# Patient Record
Sex: Male | Born: 1965 | Race: White | Hispanic: No | Smoking: Never smoker
Health system: Southern US, Community
[De-identification: ages and names within clinical notes are randomized; demographics above are authoritative.]

## PROBLEM LIST (undated history)

## (undated) DIAGNOSIS — I4891 Unspecified atrial fibrillation: Secondary | ICD-10-CM

## (undated) DIAGNOSIS — M109 Gout, unspecified: Secondary | ICD-10-CM

## (undated) DIAGNOSIS — I10 Essential (primary) hypertension: Secondary | ICD-10-CM

## (undated) DIAGNOSIS — M1990 Unspecified osteoarthritis, unspecified site: Secondary | ICD-10-CM

## (undated) DIAGNOSIS — E119 Type 2 diabetes mellitus without complications: Secondary | ICD-10-CM

## (undated) DIAGNOSIS — G4730 Sleep apnea, unspecified: Secondary | ICD-10-CM

## (undated) DIAGNOSIS — I509 Heart failure, unspecified: Secondary | ICD-10-CM

## (undated) DIAGNOSIS — E785 Hyperlipidemia, unspecified: Secondary | ICD-10-CM

## (undated) HISTORY — PX: KNEE ARTHROSCOPY: SUR90

## (undated) HISTORY — DX: Essential (primary) hypertension: I10

## (undated) HISTORY — DX: Unspecified atrial fibrillation: I48.91

## (undated) HISTORY — DX: Heart failure, unspecified: I50.9

## (undated) HISTORY — DX: Type 2 diabetes mellitus without complications: E11.9

## (undated) HISTORY — DX: Morbid (severe) obesity due to excess calories: E66.01

## (undated) HISTORY — DX: Unspecified osteoarthritis, unspecified site: M19.90

## (undated) HISTORY — DX: Gout, unspecified: M10.9

## (undated) HISTORY — PX: ANKLE ARTHROSCOPY: SUR85

## (undated) HISTORY — DX: Hyperlipidemia, unspecified: E78.5

## (undated) HISTORY — PX: CYST REMOVAL HAND: SHX6279

---

## 2000-06-05 ENCOUNTER — Encounter: Admission: RE | Admit: 2000-06-05 | Discharge: 2000-06-05 | Payer: Self-pay | Admitting: Gastroenterology

## 2000-06-05 ENCOUNTER — Encounter: Payer: Self-pay | Admitting: Gastroenterology

## 2000-11-03 ENCOUNTER — Encounter: Payer: Self-pay | Admitting: Orthopedic Surgery

## 2000-11-03 ENCOUNTER — Ambulatory Visit (HOSPITAL_COMMUNITY): Admission: RE | Admit: 2000-11-03 | Discharge: 2000-11-03 | Payer: Self-pay | Admitting: Orthopedic Surgery

## 2008-09-12 ENCOUNTER — Emergency Department (HOSPITAL_COMMUNITY): Admission: EM | Admit: 2008-09-12 | Discharge: 2008-09-12 | Payer: Self-pay | Admitting: Emergency Medicine

## 2008-10-21 ENCOUNTER — Ambulatory Visit (HOSPITAL_COMMUNITY): Admission: RE | Admit: 2008-10-21 | Discharge: 2008-10-21 | Payer: Self-pay | Admitting: Specialist

## 2010-06-10 ENCOUNTER — Encounter: Payer: Self-pay | Admitting: Specialist

## 2012-02-05 ENCOUNTER — Encounter (INDEPENDENT_AMBULATORY_CARE_PROVIDER_SITE_OTHER): Payer: Self-pay | Admitting: *Deleted

## 2013-03-25 ENCOUNTER — Encounter: Payer: Self-pay | Admitting: Nurse Practitioner

## 2013-03-25 NOTE — Progress Notes (Signed)
  Subjective:    Patient ID: Evan [name], male    DOB: 1966-05-12, 47 y.o.   MRN: [id number]  HPI    Review of Systems     Objective:   Physical Exam        Assessment & Plan:  Erroneous

## 2016-09-22 ENCOUNTER — Encounter: Payer: Self-pay | Admitting: Internal Medicine

## 2016-09-22 ENCOUNTER — Ambulatory Visit (INDEPENDENT_AMBULATORY_CARE_PROVIDER_SITE_OTHER): Payer: Medicaid Other

## 2016-09-22 ENCOUNTER — Ambulatory Visit (INDEPENDENT_AMBULATORY_CARE_PROVIDER_SITE_OTHER): Payer: Medicaid Other | Admitting: Family Medicine

## 2016-09-22 ENCOUNTER — Encounter: Payer: Self-pay | Admitting: Family Medicine

## 2016-09-22 VITALS — BP 161/103 | HR 81 | Temp 97.7°F | Ht 77.0 in | Wt 289.0 lb

## 2016-09-22 DIAGNOSIS — IMO0002 Reserved for concepts with insufficient information to code with codable children: Secondary | ICD-10-CM

## 2016-09-22 DIAGNOSIS — E785 Hyperlipidemia, unspecified: Secondary | ICD-10-CM

## 2016-09-22 DIAGNOSIS — I10 Essential (primary) hypertension: Secondary | ICD-10-CM | POA: Insufficient documentation

## 2016-09-22 DIAGNOSIS — E11610 Type 2 diabetes mellitus with diabetic neuropathic arthropathy: Secondary | ICD-10-CM

## 2016-09-22 DIAGNOSIS — M546 Pain in thoracic spine: Secondary | ICD-10-CM

## 2016-09-22 DIAGNOSIS — E1165 Type 2 diabetes mellitus with hyperglycemia: Secondary | ICD-10-CM

## 2016-09-22 DIAGNOSIS — E119 Type 2 diabetes mellitus without complications: Secondary | ICD-10-CM

## 2016-09-22 DIAGNOSIS — Z794 Long term (current) use of insulin: Secondary | ICD-10-CM | POA: Diagnosis not present

## 2016-09-22 DIAGNOSIS — M109 Gout, unspecified: Secondary | ICD-10-CM | POA: Insufficient documentation

## 2016-09-22 DIAGNOSIS — E11628 Type 2 diabetes mellitus with other skin complications: Secondary | ICD-10-CM | POA: Insufficient documentation

## 2016-09-22 DIAGNOSIS — E1169 Type 2 diabetes mellitus with other specified complication: Secondary | ICD-10-CM

## 2016-09-22 DIAGNOSIS — Z1211 Encounter for screening for malignant neoplasm of colon: Secondary | ICD-10-CM | POA: Diagnosis not present

## 2016-09-22 DIAGNOSIS — E1159 Type 2 diabetes mellitus with other circulatory complications: Secondary | ICD-10-CM | POA: Insufficient documentation

## 2016-09-22 DIAGNOSIS — I152 Hypertension secondary to endocrine disorders: Secondary | ICD-10-CM | POA: Insufficient documentation

## 2016-09-22 LAB — BAYER DCA HB A1C WAIVED: HB A1C (BAYER DCA - WAIVED): 7.3 % — ABNORMAL HIGH (ref <7.0)

## 2016-09-22 MED ORDER — JARDIANCE 25 MG PO TABS: 25.0000 mg | ORAL_TABLET | Freq: Every day | ORAL | 2 refills | Status: DC

## 2016-09-22 MED ORDER — BENAZEPRIL HCL 20 MG PO TABS: 20.0000 mg | ORAL_TABLET | Freq: Every day | ORAL | 2 refills | Status: DC

## 2016-09-22 MED ORDER — DULAGLUTIDE 0.75 MG/0.5ML ~~LOC~~ SOAJ: 0.7500 mg | SUBCUTANEOUS | 2 refills | Status: DC

## 2016-09-22 NOTE — Progress Notes (Signed)
BP (!) 161/103   Pulse 81   Temp 97.7 F (36.5 C) (Oral)   Ht '6\' 5"'  (1.956 m)   Wt 289 lb (131.1 kg)   BMI 34.27 kg/m    Subjective:    Patient ID: Evan [name], male    DOB: 13-Feb-1966, 50 y.o.   MRN: [id number]  HPI: Evan [name] is a 51 y.o. male presenting on 09/22/2016 for Establish Care (heartburn - was taking Ranitidine in past, has tried OTC Zantac but does not help); Tingling in feet; and Dizziness, fullness in right ear (has taken Sudafed)   HPI Type 2 diabetes mellitus Patient comes in today for establishing care as a new patient and check of his diabetes. Patient has been currently taking Levemir and Jardiance. Patient is currently on an ACE inhibitor. Patient has not seen an ophthalmologist this year. Patient denies any new issues with his feet but does feel like he has some chronic neuropathy.   Hyperlipidemia Patient is coming in for check of his hyperlipidemia. He is currently taking omega 3. He denies any issues with myalgias or history of liver damage from it. He denies any focal numbness or weakness or chest pain.   Hypertension recheck Patient comes in for check for hypertension and is currently on benazepril. Patient's blood pressure today is 161/103 but he has been out of his medication for about a week and a half. Patient denies headaches, blurred vision, chest pains, shortness of breath, or weakness. Denies any side effects from medication and is content with current medication.   Back pain Patient has been having pain on right back belowHer right scapula. The pain has been going on for the past week. He has tried over-the-counter ibuprofen and Tylenol which helps some but he still having the pain and the soreness. He denies any chest pain or shortness of breath or wheezing. He denies any pain radiating anywhere else. He says the pain is low-grade and out 4 out of 10.  Relevant past medical, surgical, family and social history reviewed and updated as  indicated. Interim medical history since our last visit reviewed. Allergies and medications reviewed and updated.  Review of Systems  Constitutional: Negative for chills and fever.  HENT: Negative for ear pain and tinnitus.   Eyes: Negative for pain and discharge.  Respiratory: Negative for cough, shortness of breath and wheezing.   Cardiovascular: Negative for chest pain, palpitations and leg swelling.  Gastrointestinal: Negative for abdominal pain, blood in stool, constipation and diarrhea.  Genitourinary: Negative for dysuria and hematuria.  Musculoskeletal: Positive for back pain. Negative for gait problem and myalgias.  Skin: Negative for rash.  Neurological: Negative for dizziness, weakness and headaches.  Psychiatric/Behavioral: Negative for suicidal ideas.  All other systems reviewed and are negative.   Per HPI unless specifically indicated above  Social History   Social History  . Marital status: Single    Spouse name: N/A  . Number of children: N/A  . Years of education: N/A   Occupational History  . Not on file.   Social History Main Topics  . Smoking status: Never Smoker  . Smokeless tobacco: Never Used  . Alcohol use 7.2 oz/week    12 Cans of beer per week  . Drug use: No  . Sexual activity: Yes     Comment: male 8 months partner   Other Topics Concern  . Not on file   Social History Narrative  . No narrative on file    Past  Surgical History:  Procedure Laterality Date  . ANKLE ARTHROSCOPY Right   . HERNIA REPAIR    . KNEE ARTHROSCOPY Left     Family History  Problem Relation Age of Onset  . Diabetes Mother   . Stroke Mother   . Early death Father   . Heart attack Father   . Alcohol abuse Father   . Heart disease Father   . Hypertension Brother   . Early death Paternal Grandfather   . Heart attack Paternal Grandfather     Allergies as of 09/22/2016   No Known Allergies     Medication List       Accurate as of 09/22/16 10:27 AM.  Always use your most recent med list.          allopurinol 300 MG tablet Commonly known as:  ZYLOPRIM Take 300 mg by mouth daily.   BD PEN NEEDLE NANO U/F 32G X 4 MM Misc Generic drug:  Insulin Pen Needle USE DAILY WITH INSULIN   benazepril 20 MG tablet Commonly known as:  LOTENSIN Take 1 tablet (20 mg total) by mouth daily.   cyclobenzaprine 10 MG tablet Commonly known as:  FLEXERIL Take 10 mg by mouth 2 (two) times daily.   Dulaglutide 0.75 MG/0.5ML Sopn Commonly known as:  TRULICITY Inject 1.61 mg into the skin once a week.   Fish Oil 1000 MG Caps Take 2,000 mg by mouth daily.   ibuprofen 800 MG tablet Commonly known as:  ADVIL,MOTRIN Take 800 mg by mouth as needed.   insulin detemir 100 UNIT/ML injection Commonly known as:  LEVEMIR Inject 30 Units into the skin at bedtime.   JARDIANCE 25 MG Tabs tablet Generic drug:  empagliflozin Take 25 mg by mouth daily.   sildenafil 20 MG tablet Commonly known as:  REVATIO Take 20 mg by mouth as needed.          Objective:    BP (!) 161/103   Pulse 81   Temp 97.7 F (36.5 C) (Oral)   Ht '6\' 5"'  (1.956 m)   Wt 289 lb (131.1 kg)   BMI 34.27 kg/m   Wt Readings from Last 3 Encounters:  09/22/16 289 lb (131.1 kg)  03/25/13 186 lb (84.4 kg)    Physical Exam  Constitutional: He is oriented to person, place, and time. He appears well-developed and well-nourished. No distress.  Eyes: Conjunctivae are normal. No scleral icterus.  Neck: Neck supple. No thyromegaly present.  Cardiovascular: Normal rate, regular rhythm, normal heart sounds and intact distal pulses.   No murmur heard. Pulmonary/Chest: Effort normal and breath sounds normal. No respiratory distress. He has no wheezes.  Musculoskeletal: Normal range of motion. He exhibits no edema.       Back:  Lymphadenopathy:    He has no cervical adenopathy.  Neurological: He is alert and oriented to person, place, and time. Coordination normal.  Skin: Skin is warm  and dry. No rash noted. He is not diaphoretic.  Psychiatric: He has a normal mood and affect. His behavior is normal.  Nursing note and vitals reviewed.   Chest x-ray: No acute cardiopulmonary abnormality.    Assessment & Plan:   Problem List Items Addressed This Visit      Cardiovascular and Mediastinum   Hypertension   Relevant Medications   sildenafil (REVATIO) 20 MG tablet   benazepril (LOTENSIN) 20 MG tablet   Other Relevant Orders   CMP14+EGFR (Completed)     Endocrine   Diabetes mellitus type 2,  uncontrolled (HCC)   Relevant Medications   insulin detemir (LEVEMIR) 100 UNIT/ML injection   JARDIANCE 25 MG TABS tablet   benazepril (LOTENSIN) 20 MG tablet   Other Relevant Orders   Bayer DCA Hb A1c Waived (Completed)    Other Visit Diagnoses    Hyperlipidemia associated with type 2 diabetes mellitus (HCC)    -  Primary   Relevant Medications   sildenafil (REVATIO) 20 MG tablet   insulin detemir (LEVEMIR) 100 UNIT/ML injection   Omega-3 Fatty Acids (FISH OIL) 1000 MG CAPS   JARDIANCE 25 MG TABS tablet   benazepril (LOTENSIN) 20 MG tablet   Other Relevant Orders   Lipid panel (Completed)   Acute right-sided thoracic back pain       Relevant Medications   cyclobenzaprine (FLEXERIL) 10 MG tablet   ibuprofen (ADVIL,MOTRIN) 800 MG tablet   Other Relevant Orders   DG Chest 2 View (Completed)   Colon cancer screening       Relevant Orders   Ambulatory referral to Gastroenterology       Follow up plan: Return in about 4 weeks (around 10/20/2016), or if symptoms worsen or fail to improve, for Recheck hypertension and diabetes, also set up the patient appointment with Tammy for diabetes.  Caryl Pina, MD [facility] 09/22/2016, 10:27 AM

## 2016-09-23 ENCOUNTER — Telehealth: Payer: Self-pay

## 2016-09-23 DIAGNOSIS — Z794 Long term (current) use of insulin: Principal | ICD-10-CM

## 2016-09-23 DIAGNOSIS — E1165 Type 2 diabetes mellitus with hyperglycemia: Principal | ICD-10-CM

## 2016-09-23 DIAGNOSIS — IMO0002 Reserved for concepts with insufficient information to code with codable children: Secondary | ICD-10-CM

## 2016-09-23 DIAGNOSIS — E11610 Type 2 diabetes mellitus with diabetic neuropathic arthropathy: Secondary | ICD-10-CM

## 2016-09-23 LAB — LIPID PANEL
CHOL/HDL RATIO: 6.1 ratio — AB (ref 0.0–5.0)
Cholesterol, Total: 202 mg/dL — ABNORMAL HIGH (ref 100–199)
HDL: 33 mg/dL — ABNORMAL LOW (ref >39)
LDL Calculated: 102 mg/dL — ABNORMAL HIGH (ref 0–99)
Triglycerides: 337 mg/dL — ABNORMAL HIGH (ref 0–149)
VLDL Cholesterol Cal: 67 mg/dL — ABNORMAL HIGH (ref 5–40)

## 2016-09-23 LAB — CMP14+EGFR
A/G RATIO: 1.8 (ref 1.2–2.2)
ALT: 20 IU/L (ref 0–44)
AST: 14 IU/L (ref 0–40)
Albumin: 4.8 g/dL (ref 3.5–5.5)
Alkaline Phosphatase: 78 IU/L (ref 39–117)
BILIRUBIN TOTAL: 0.3 mg/dL (ref 0.0–1.2)
BUN / CREAT RATIO: 17 (ref 9–20)
BUN: 17 mg/dL (ref 6–24)
CALCIUM: 9.4 mg/dL (ref 8.7–10.2)
CHLORIDE: 100 mmol/L (ref 96–106)
CO2: 25 mmol/L (ref 18–29)
Creatinine, Ser: 0.98 mg/dL (ref 0.76–1.27)
GFR calc non Af Amer: 90 mL/min/{1.73_m2} (ref >59)
GFR, EST AFRICAN AMERICAN: 103 mL/min/{1.73_m2} (ref >59)
Globulin, Total: 2.7 g/dL (ref 1.5–4.5)
Glucose: 175 mg/dL — ABNORMAL HIGH (ref 65–99)
POTASSIUM: 4.4 mmol/L (ref 3.5–5.2)
SODIUM: 141 mmol/L (ref 134–144)
Total Protein: 7.5 g/dL (ref 6.0–8.5)

## 2016-09-25 ENCOUNTER — Ambulatory Visit: Payer: Medicaid Other | Admitting: Pharmacist

## 2016-09-25 MED ORDER — DULAGLUTIDE 0.75 MG/0.5ML ~~LOC~~ SOAJ: 0.7500 mg | SUBCUTANEOUS | 0 refills | Status: DC

## 2016-09-25 NOTE — Telephone Encounter (Signed)
[email or web address]

## 2016-09-25 NOTE — Telephone Encounter (Signed)
Please forward this request to clinical pharmacy. I am not familiar with this patient.

## 2016-09-25 NOTE — Telephone Encounter (Signed)
Not sure if you want tammy to address this or what

## 2016-09-25 NOTE — Telephone Encounter (Signed)
Left #2 samples for patient to pick up.  This should last until his appt with me later in May.  I will discuss other options and teach how to inject at that appointment.

## 2016-10-02 ENCOUNTER — Telehealth: Payer: Self-pay | Admitting: Family Medicine

## 2016-10-02 NOTE — Telephone Encounter (Signed)
Spoke with Freda Munro at [facility] to inform

## 2016-10-02 NOTE — Telephone Encounter (Signed)
Patient was given #2 Truliclity samples to continue until he comes in for next appointment which is 10/07/16.  At that time we are going to discuss options that are covered by his insurance and teach he how to use new device.   We are not persuing PA for Trulicity at this time.

## 2016-10-07 ENCOUNTER — Ambulatory Visit (INDEPENDENT_AMBULATORY_CARE_PROVIDER_SITE_OTHER): Payer: Medicaid Other | Admitting: Pharmacist

## 2016-10-07 ENCOUNTER — Encounter: Payer: Self-pay | Admitting: Pharmacist

## 2016-10-07 ENCOUNTER — Other Ambulatory Visit: Payer: Self-pay | Admitting: Pharmacist

## 2016-10-07 VITALS — BP 134/88 | HR 85 | Ht 77.0 in | Wt 293.5 lb

## 2016-10-07 DIAGNOSIS — Z794 Long term (current) use of insulin: Secondary | ICD-10-CM | POA: Diagnosis not present

## 2016-10-07 DIAGNOSIS — E1165 Type 2 diabetes mellitus with hyperglycemia: Secondary | ICD-10-CM | POA: Diagnosis not present

## 2016-10-07 DIAGNOSIS — E11610 Type 2 diabetes mellitus with diabetic neuropathic arthropathy: Secondary | ICD-10-CM | POA: Diagnosis not present

## 2016-10-07 DIAGNOSIS — IMO0002 Reserved for concepts with insufficient information to code with codable children: Secondary | ICD-10-CM

## 2016-10-07 MED ORDER — EXENATIDE ER 2 MG ~~LOC~~ SRER: 2.0000 mg | SUBCUTANEOUS | 1 refills | Status: DC

## 2016-10-07 NOTE — Patient Instructions (Addendum)
Start Bydureon 24m - inject once per week If you start to get blood glucose readings below 100, then decrease Levemir by 10 units.   Diabetes and Standards of Medical Care   Diabetes is complicated. You may find that your diabetes team includes a dietitian, nurse, diabetes educator, eye doctor, and more. To help everyone know what is going on and to help you get the care you deserve, the following schedule of care was developed to help keep you on track. Below are the tests, exams, vaccines, medicines, education, and plans you will need.  Blood Glucose Goals Prior to meals = 80 - 130 Within 2 hours of the start of a meal = less than 180  HbA1c test (goal is less than 7.0% - your last value was 7.3%) This test shows how well you have controlled your glucose over the past 2 to 3 months. It is used to see if your diabetes management plan needs to be adjusted.   It is performed at least 2 times a year if you are meeting treatment goals.  It is performed 4 times a year if therapy has changed or if you are not meeting treatment goals.  Blood pressure test  This test is performed at every routine medical visit. The goal is less than 140/90 mmHg for most people, but 130/80 mmHg in some cases. Ask your health care provider about your goal.  Dental exam  Follow up with the dentist regularly.  Eye exam  If you are diagnosed with type 1 diabetes as a child, get an exam upon reaching the age of 130years or older and have had diabetes for 3 to 5 years. Yearly eye exams are recommended after that initial eye exam.  If you are diagnosed with type 1 diabetes as an adult, get an exam within 5 years of diagnosis and then yearly.  If you are diagnosed with type 2 diabetes, get an exam as soon as possible after the diagnosis and then yearly.  Foot care exam  Visual foot exams are performed at every routine medical visit. The exams check for cuts, injuries, or other problems with the feet.  A  comprehensive foot exam should be done yearly. This includes visual inspection as well as assessing foot pulses and testing for loss of sensation.  Check your feet nightly for cuts, injuries, or other problems with your feet. Tell your health care provider if anything is not healing.  Kidney function test (urine microalbumin)  This test is performed once a year.  Type 1 diabetes: The first test is performed 5 years after diagnosis.  Type 2 diabetes: The first test is performed at the time of diagnosis.  A serum creatinine and estimated glomerular filtration rate (eGFR) test is done once a year to assess the level of chronic kidney disease (CKD), if present.  Lipid profile (cholesterol, HDL, LDL, triglycerides)  Performed every 5 years for most people.  The goal for LDL is less than 100 mg/dL. If you are at high risk, the goal is less than 70 mg/dL.  The goal for HDL is 40 mg/dL to 50 mg/dL for men and 50 mg/dL to 60 mg/dL for women. An HDL cholesterol of 60 mg/dL or higher gives some protection against heart disease.  The goal for triglycerides is less than 150 mg/dL.  Influenza vaccine, pneumococcal vaccine, and hepatitis B vaccine  The influenza vaccine is recommended yearly.  The pneumococcal vaccine is generally given once in a lifetime. However, there are  some instances when another vaccination is recommended. Check with your health care provider.  The hepatitis B vaccine is also recommended for adults with diabetes.  Diabetes self-management education  Education is recommended at diagnosis and ongoing as needed.  Treatment plan  Your treatment plan is reviewed at every medical visit.  Document Released: 03/02/2009 Document Revised: 01/05/2013 Document Reviewed: 10/05/2012 ExitCare Patient Information 2014 [employer].   

## 2016-10-07 NOTE — Progress Notes (Signed)
Patient ID: Evan [name], male   DOB: Jun 10, 1965, 51 y.o.   MRN: [id number]  Subjective:    Evan [name] is a 51 y.o. male who presents for an initial consult  And education for  Type 2 diabetes mellitus.  Mr. Huelsmann as initially diagnosed with DM about 2 years ago.  He is fairly new to our practice and this is his first visit with me.  Current symptoms/problems include hyperglycemia and paresthesia of the feet and have been unchanged. Symptoms have been present for 2 months.  Current diabetic medications include levemir 30 units qpm, Jardiance 25mg  qd.  He was suppose to start Trulicity but was not covered by his insurance (preferred is Bydureon or Victoza) and he did not pick up samples of Trulicity that were left for him. He has tried metformin 500mg  bid in past but stopped due to continued loose stools.   Current monitoring regimen: home blood tests - 1-2 times daily Home blood sugar records: fasting range: 110-254 and evening BG 130's to 170's Any episodes of hypoglycemia? no  Known diabetic complications: peripheral neuropathy Cardiovascular risk factors: diabetes mellitus, dyslipidemia, hypertension, male gender, obesity (BMI >= 30 kg/m2) and sedentary lifestyle Eye exam current (within one year): yes Weight trend: stable Prior visit with CDE: no Current diet: in general, an "unhealthy" diet Current exercise: none Medication Compliance?  Yes   Is He on ACE inhibitor or angiotensin II receptor blocker?  Yes  benazepril (Lotensin)    The following portions of the patient's history were reviewed and updated as appropriate: allergies, current medications, past family history, past medical history, past social history, past surgical history and problem list.    Objective:    BP 134/88   Pulse 85   Ht 6\' 5"  (1.956 m)   Wt 293 lb 8 oz (133.1 kg)   BMI 34.80 kg/m   Lab Review Glucose (mg/dL)  Date Value  09/22/2016 175 (H)   CO2 (mmol/L)  Date Value  09/22/2016 25    BUN (mg/dL)  Date Value  09/22/2016 17   Creatinine, Ser (mg/dL)  Date Value  09/22/2016 0.98      Assessment:    Diabetes Mellitus type II, under inadequate control.    Plan:    1.  Rx changes:     Start Bydureon 2mg  SQ weekly  - first dose given in office today.  Plan is to see if we can decreased and eventually stop Levemir.  If insulin is still needed then can consider a GLP/long acting insulin combo such as Xultophy  Continue Jardiance 25mg  qd 2.  Education: Reviewed 'ABCs' of diabetes management (respective goals in parentheses):  A1C (<7), blood pressure (<130/80), and cholesterol (LDL <100). 3. CHO counting diet discussed.  Reviewed CHO amount in various foods and how to read nutrition labels.  Discussed recommended serving sizes.  4.  Recommend check BG 1-2  times a day 5.  Follow up with PCP in 2 weeks and with me as needed.

## 2016-10-08 MED ORDER — SILDENAFIL CITRATE 20 MG PO TABS: 20.0000 mg | ORAL_TABLET | ORAL | 1 refills | Status: DC | PRN

## 2016-10-08 NOTE — Telephone Encounter (Signed)
Pt aware Rx sent to pharmacy Instructed to DC use if develop chest pain

## 2016-10-20 ENCOUNTER — Ambulatory Visit (INDEPENDENT_AMBULATORY_CARE_PROVIDER_SITE_OTHER): Payer: Medicaid Other | Admitting: Family Medicine

## 2016-10-20 ENCOUNTER — Encounter: Payer: Self-pay | Admitting: Family Medicine

## 2016-10-20 VITALS — BP 153/94 | HR 89 | Temp 98.2°F | Ht 77.0 in | Wt 291.0 lb

## 2016-10-20 DIAGNOSIS — E11610 Type 2 diabetes mellitus with diabetic neuropathic arthropathy: Secondary | ICD-10-CM

## 2016-10-20 DIAGNOSIS — E1165 Type 2 diabetes mellitus with hyperglycemia: Secondary | ICD-10-CM

## 2016-10-20 DIAGNOSIS — IMO0002 Reserved for concepts with insufficient information to code with codable children: Secondary | ICD-10-CM

## 2016-10-20 DIAGNOSIS — Z794 Long term (current) use of insulin: Secondary | ICD-10-CM | POA: Diagnosis not present

## 2016-10-20 DIAGNOSIS — I10 Essential (primary) hypertension: Secondary | ICD-10-CM | POA: Diagnosis not present

## 2016-10-20 MED ORDER — BLOOD GLUCOSE TEST VI STRP: 1.0000 | ORAL_STRIP | Freq: Two times a day (BID) | 11 refills | Status: DC

## 2016-10-20 MED ORDER — INSULIN DETEMIR 100 UNIT/ML FLEXPEN: 30.0000 [IU] | PEN_INJECTOR | Freq: Every day | SUBCUTANEOUS | 11 refills | Status: DC

## 2016-10-20 MED ORDER — BD PEN NEEDLE NANO U/F 32G X 4 MM MISC: 3 refills | Status: DC

## 2016-10-20 NOTE — Assessment & Plan Note (Signed)
Patient started Bydureon, improving, will refill Levemir but may need to back off on the Levemir as the Bydureon gets into his system.

## 2016-10-20 NOTE — Progress Notes (Signed)
BP (!) 150/93   Pulse 89   Temp 98.2 F (36.8 C) (Oral)   Ht 6\' 5"  (1.956 m)   Wt 291 lb (132 kg)   BMI 34.51 kg/m    Subjective:    Patient ID: Evan [name], male    DOB: 11-Jul-1965, 51 y.o.   MRN: [id number]  HPI: Evan [name] is a 51 y.o. male presenting on 10/20/2016 for Diabetes (4 wk) and Hypertension   HPI Type 2 diabetes mellitus Patient comes in today for recheck of his diabetes. Patient has been currently taking Jardiance and Bydureon and Levemir, the Bydureon was just started. Patient is currently on an ACE inhibitor. Patient has not seen an ophthalmologist this year. Patient denies any issues with his feet.  Hypertension Patient is currently on benazepril, and her blood pressure today is 150/93, he admits that he is not taking the medication yet today. Patient denies any lightheadedness or dizziness. Patient denies headaches, blurred vision, chest pains, shortness of breath, or weakness. Denies any side effects from medication and is content with current medication.    Relevant past medical, surgical, family and social history reviewed and updated as indicated. Interim medical history since our last visit reviewed. Allergies and medications reviewed and updated.  Review of Systems  Constitutional: Negative for chills and fever.  Eyes: Negative for discharge.  Respiratory: Negative for shortness of breath and wheezing.   Cardiovascular: Negative for chest pain and leg swelling.  Musculoskeletal: Negative for back pain and gait problem.  Skin: Negative for rash.  Neurological: Negative for dizziness, weakness, light-headedness and headaches.  All other systems reviewed and are negative.   Per HPI unless specifically indicated above        Objective:    BP (!) 150/93   Pulse 89   Temp 98.2 F (36.8 C) (Oral)   Ht 6\' 5"  (1.956 m)   Wt 291 lb (132 kg)   BMI 34.51 kg/m   Wt Readings from Last 3 Encounters:  10/20/16 291 lb (132 kg)  10/07/16 293 lb 8  oz (133.1 kg)  09/22/16 289 lb (131.1 kg)    Physical Exam  Constitutional: He is oriented to person, place, and time. He appears well-developed and well-nourished. No distress.  Eyes: Conjunctivae are normal. No scleral icterus.  Neck: Neck supple. No thyromegaly present.  Cardiovascular: Normal rate, regular rhythm, normal heart sounds and intact distal pulses.   No murmur heard. Pulmonary/Chest: Effort normal and breath sounds normal. No respiratory distress. He has no wheezes. He has no rales.  Musculoskeletal: Normal range of motion. He exhibits no edema.  Lymphadenopathy:    He has no cervical adenopathy.  Neurological: He is alert and oriented to person, place, and time. Coordination normal.  Skin: Skin is warm and dry. No rash noted. He is not diaphoretic.  Psychiatric: He has a normal mood and affect. His behavior is normal.  Nursing note and vitals reviewed.       Assessment & Plan:   Problem List Items Addressed This Visit      Cardiovascular and Mediastinum   Hypertension - Primary    Did not take meds today, was good at last visit with Tammy, will check an future        Endocrine   Diabetes mellitus type 2, uncontrolled ([place])    Patient started Bydureon, improving, will refill Levemir but may need to back off on the Levemir as the Bydureon gets into his system.  Follow up plan: Return in about 2 months (around 12/20/2016), or if symptoms worsen or fail to improve, for Hypertension and diabetes recheck.  Counseling provided for all of the vaccine components No orders of the defined types were placed in this encounter.   Caryl Pina, MD [facility] 10/20/2016, 3:36 PM

## 2016-10-20 NOTE — Assessment & Plan Note (Signed)
Did not take meds today, was good at last visit with Tammy, will check an future

## 2016-11-11 ENCOUNTER — Other Ambulatory Visit: Payer: Self-pay | Admitting: Pharmacist

## 2016-11-18 ENCOUNTER — Other Ambulatory Visit: Payer: Self-pay | Admitting: Family Medicine

## 2016-11-18 ENCOUNTER — Ambulatory Visit (AMBULATORY_SURGERY_CENTER): Payer: Self-pay | Admitting: *Deleted

## 2016-11-18 VITALS — Ht 76.5 in | Wt 288.8 lb

## 2016-11-18 DIAGNOSIS — E11610 Type 2 diabetes mellitus with diabetic neuropathic arthropathy: Secondary | ICD-10-CM

## 2016-11-18 DIAGNOSIS — Z794 Long term (current) use of insulin: Principal | ICD-10-CM

## 2016-11-18 DIAGNOSIS — E1165 Type 2 diabetes mellitus with hyperglycemia: Principal | ICD-10-CM

## 2016-11-18 DIAGNOSIS — IMO0002 Reserved for concepts with insufficient information to code with codable children: Secondary | ICD-10-CM

## 2016-11-18 DIAGNOSIS — Z1211 Encounter for screening for malignant neoplasm of colon: Secondary | ICD-10-CM

## 2016-11-18 NOTE — Progress Notes (Signed)
Denies allergies to eggs or soy products. Denies complications with sedation or anesthesia. Denies O2 use. Denies use of diet or weight loss medications.  Emmi instructions given for colonoscopy.  

## 2016-12-01 ENCOUNTER — Encounter: Payer: Self-pay | Admitting: Internal Medicine

## 2016-12-11 ENCOUNTER — Other Ambulatory Visit: Payer: Self-pay | Admitting: Family Medicine

## 2016-12-11 DIAGNOSIS — IMO0002 Reserved for concepts with insufficient information to code with codable children: Secondary | ICD-10-CM

## 2016-12-11 DIAGNOSIS — E11610 Type 2 diabetes mellitus with diabetic neuropathic arthropathy: Secondary | ICD-10-CM

## 2016-12-11 DIAGNOSIS — E1165 Type 2 diabetes mellitus with hyperglycemia: Principal | ICD-10-CM

## 2016-12-11 DIAGNOSIS — Z794 Long term (current) use of insulin: Principal | ICD-10-CM

## 2016-12-22 ENCOUNTER — Ambulatory Visit (INDEPENDENT_AMBULATORY_CARE_PROVIDER_SITE_OTHER): Payer: Medicaid Other | Admitting: Family Medicine

## 2016-12-22 ENCOUNTER — Encounter: Payer: Self-pay | Admitting: Family Medicine

## 2016-12-22 VITALS — BP 137/83 | HR 89 | Temp 97.6°F | Ht 76.5 in | Wt 289.0 lb

## 2016-12-22 DIAGNOSIS — E1169 Type 2 diabetes mellitus with other specified complication: Secondary | ICD-10-CM

## 2016-12-22 DIAGNOSIS — E785 Hyperlipidemia, unspecified: Secondary | ICD-10-CM

## 2016-12-22 DIAGNOSIS — M109 Gout, unspecified: Secondary | ICD-10-CM | POA: Diagnosis not present

## 2016-12-22 DIAGNOSIS — Z794 Long term (current) use of insulin: Secondary | ICD-10-CM

## 2016-12-22 DIAGNOSIS — E1165 Type 2 diabetes mellitus with hyperglycemia: Secondary | ICD-10-CM

## 2016-12-22 DIAGNOSIS — E11610 Type 2 diabetes mellitus with diabetic neuropathic arthropathy: Secondary | ICD-10-CM

## 2016-12-22 DIAGNOSIS — I10 Essential (primary) hypertension: Secondary | ICD-10-CM | POA: Diagnosis not present

## 2016-12-22 DIAGNOSIS — E669 Obesity, unspecified: Secondary | ICD-10-CM

## 2016-12-22 DIAGNOSIS — R5383 Other fatigue: Secondary | ICD-10-CM | POA: Diagnosis not present

## 2016-12-22 DIAGNOSIS — IMO0002 Reserved for concepts with insufficient information to code with codable children: Secondary | ICD-10-CM

## 2016-12-22 DIAGNOSIS — E782 Mixed hyperlipidemia: Secondary | ICD-10-CM | POA: Insufficient documentation

## 2016-12-22 HISTORY — DX: Morbid (severe) obesity due to excess calories: E66.01

## 2016-12-22 LAB — BAYER DCA HB A1C WAIVED: HB A1C (BAYER DCA - WAIVED): 7.1 % — ABNORMAL HIGH (ref <7.0)

## 2016-12-22 NOTE — Progress Notes (Signed)
BP 137/83   Pulse 89   Temp 97.6 F (36.4 C) (Oral)   Ht 6' 4.5" (1.943 m)   Wt 289 lb (131.1 kg)   BMI 34.72 kg/m    Subjective:    Patient ID: Evan [name], male    DOB: January 29, 1966, 51 y.o.   MRN: [id number]  HPI: Evan [name] is a 51 y.o. male presenting on 12/22/2016 for Follow-up (pt here for a 2 month follow up and also c/o "gout in right ankle" and having no energy)   HPI Hypertension Patient is currently on Benazepril, and their blood pressure today is 137/83. Patient denies any lightheadedness or dizziness. Patient denies headaches, blurred vision, chest pains, shortness of breath, or weakness. Denies any side effects from medication and is content with current medication.   Type 2 diabetes mellitus Patient comes in today for recheck of his diabetes. Patient has been currently taking Jardiance and Levemir 30 units and Bydureon. Patient is currently on an ACE inhibitor/ARB. Patient has not seen an ophthalmologist this year. Patient denies any issues with their feet besides the ankle swelling and gout in 1 foot. He says that his blood sugars have been running less than 130 and he has even been out of Levemir for a couple days and they've been running that well. He does have the Levemir again.  Hyperlipidemia Patient is coming in for recheck of his hyperlipidemia. The patient is currently taking simvastatin and Fish oils. They deny any issues with myalgias or history of liver damage from it. They deny any focal numbness or weakness or chest pain.   Ankle pain  Patient comes in today complaining of right lateral ankle pain and swelling and erythema that has been bothering him over the past 3 days. He thinks it may be his gout flaring up because had that previously but wanted to double check. He also said about 3 days ago he did roll his ankle while stepping backwards into a hole. He denies any fevers or chills. He has more pain with plantar flexion and inversion of the ankle. He  also has pain medication. He shows the pain is moderate to severe at this point. It does hurt him a lot to ambulate.  Fatigue Patient has been having less energy over the past few weeks and was mentioned to a friend that vitamin B12 might be cause for it and wants to get this checked. He denies any chest pain or shortness of breath.  Relevant past medical, surgical, family and social history reviewed and updated as indicated. Interim medical history since our last visit reviewed. Allergies and medications reviewed and updated.  Review of Systems  Constitutional: Positive for fatigue. Negative for chills and fever.  Eyes: Negative for discharge.  Respiratory: Negative for cough, shortness of breath and wheezing.   Cardiovascular: Negative for chest pain and leg swelling.  Musculoskeletal: Positive for arthralgias and joint swelling. Negative for back pain, gait problem and myalgias.  Skin: Positive for color change. Negative for rash.  Neurological: Negative for dizziness, light-headedness and headaches.  All other systems reviewed and are negative.   Per HPI unless specifically indicated above     Objective:    BP 137/83   Pulse 89   Temp 97.6 F (36.4 C) (Oral)   Ht 6' 4.5" (1.943 m)   Wt 289 lb (131.1 kg)   BMI 34.72 kg/m   Wt Readings from Last 3 Encounters:  12/22/16 289 lb (131.1 kg)  11/18/16 288 lb  12.8 oz (131 kg)  10/20/16 291 lb (132 kg)    Physical Exam  Constitutional: He is oriented to person, place, and time. He appears well-developed and well-nourished. No distress.  Eyes: Conjunctivae are normal. No scleral icterus.  Neck: Neck supple. No thyromegaly present.  Cardiovascular: Normal rate, regular rhythm, normal heart sounds and intact distal pulses.   No murmur heard. Pulmonary/Chest: Effort normal and breath sounds normal. No respiratory distress. He has no wheezes. He has no rales.  Musculoskeletal: Normal range of motion. He exhibits edema and  tenderness.       Right ankle: He exhibits swelling (Small spot of erythema and warmth anterior to lateral malleolus of right foot). He exhibits normal pulse. Tenderness. AITFL tenderness found.  Lymphadenopathy:    He has no cervical adenopathy.  Neurological: He is alert and oriented to person, place, and time. Coordination normal.  Skin: Skin is warm and dry. No rash noted. He is not diaphoretic.  Psychiatric: He has a normal mood and affect. His behavior is normal.  Nursing note and vitals reviewed.     Assessment & Plan:   Problem List Items Addressed This Visit      Cardiovascular and Mediastinum   Hypertension - Primary     Endocrine   Diabetes mellitus type 2, uncontrolled ([place])   Relevant Orders   Bayer DCA Hb A1c Waived   Hyperlipidemia associated with type 2 diabetes mellitus ([place])   Relevant Orders   Lipid panel     Other   Gout   Obesity (BMI 30.0-34.9)    Other Visit Diagnoses    Other fatigue       Relevant Orders   TSH   Vitamin B12       Follow up plan: Return in about 3 months (around 03/24/2017), or if symptoms worsen or fail to improve, for Recheck diabetes.  Counseling provided for all of the vaccine components Orders Placed This Encounter  Procedures  . Lipid panel  . Bayer [facility] Hb A1c [place], MD [facility] 12/22/2016, 2:08 PM

## 2016-12-23 LAB — LIPID PANEL
Chol/HDL Ratio: 5.9 ratio — ABNORMAL HIGH (ref 0.0–5.0)
Cholesterol, Total: 199 mg/dL (ref 100–199)
HDL: 34 mg/dL — AB (ref >39)
LDL CALC: 86 mg/dL (ref 0–99)
Triglycerides: 394 mg/dL — ABNORMAL HIGH (ref 0–149)
VLDL CHOLESTEROL CAL: 79 mg/dL — AB (ref 5–40)

## 2016-12-23 LAB — VITAMIN B12: VITAMIN B 12: 372 pg/mL (ref 232–1245)

## 2016-12-23 LAB — TSH: TSH: 0.635 u[IU]/mL (ref 0.450–4.500)

## 2016-12-24 ENCOUNTER — Other Ambulatory Visit: Payer: Self-pay | Admitting: *Deleted

## 2016-12-24 MED ORDER — FENOFIBRATE 48 MG PO TABS: 48.0000 mg | ORAL_TABLET | Freq: Every day | ORAL | 1 refills | Status: DC

## 2017-01-07 ENCOUNTER — Encounter: Payer: Self-pay | Admitting: Internal Medicine

## 2017-01-13 ENCOUNTER — Other Ambulatory Visit: Payer: Self-pay | Admitting: Family Medicine

## 2017-01-13 DIAGNOSIS — IMO0002 Reserved for concepts with insufficient information to code with codable children: Secondary | ICD-10-CM

## 2017-01-13 DIAGNOSIS — Z794 Long term (current) use of insulin: Principal | ICD-10-CM

## 2017-01-13 DIAGNOSIS — E11610 Type 2 diabetes mellitus with diabetic neuropathic arthropathy: Secondary | ICD-10-CM

## 2017-01-13 DIAGNOSIS — E1165 Type 2 diabetes mellitus with hyperglycemia: Principal | ICD-10-CM

## 2017-01-13 NOTE — Telephone Encounter (Signed)
Please review and advise.

## 2017-01-13 NOTE — Telephone Encounter (Signed)
That is fine go ahead and change the prescription and send it for him.

## 2017-01-14 MED ORDER — INSULIN DETEMIR 100 UNIT/ML FLEXPEN: PEN_INJECTOR | SUBCUTANEOUS | 11 refills | Status: DC

## 2017-01-14 NOTE — Telephone Encounter (Signed)
Patient aware new rx sent to pharmacy.

## 2017-02-03 ENCOUNTER — Ambulatory Visit: Payer: Self-pay | Admitting: Family Medicine

## 2017-02-04 ENCOUNTER — Encounter: Payer: Self-pay | Admitting: Family Medicine

## 2017-02-04 ENCOUNTER — Ambulatory Visit (INDEPENDENT_AMBULATORY_CARE_PROVIDER_SITE_OTHER): Payer: Medicaid Other | Admitting: Family Medicine

## 2017-02-04 VITALS — BP 152/91 | HR 106 | Temp 98.4°F | Ht 76.5 in | Wt 283.0 lb

## 2017-02-04 DIAGNOSIS — M7022 Olecranon bursitis, left elbow: Secondary | ICD-10-CM | POA: Diagnosis not present

## 2017-02-04 DIAGNOSIS — M109 Gout, unspecified: Secondary | ICD-10-CM

## 2017-02-04 MED ORDER — BETAMETHASONE SOD PHOS & ACET 6 (3-3) MG/ML IJ SUSP
6.0000 mg | Freq: Once | INTRAMUSCULAR | Status: AC
  Administered 2017-02-04: 6 mg via INTRAMUSCULAR

## 2017-02-04 MED ORDER — AMOXICILLIN-POT CLAVULANATE 875-125 MG PO TABS: 1.0000 | ORAL_TABLET | Freq: Two times a day (BID) | ORAL | 0 refills | Status: DC

## 2017-02-04 NOTE — Progress Notes (Signed)
 Subjective:  Patient ID: Evan [name], male    DOB: 08/06/1965  Age: 51 y.o. MRN: [id number]  CC: Elbow Pain (pt here today c/o left elbow being swollen and red for a couple of days but today is much better. Pt thinks it is gout.)   HPI Evan [name] presents for Swelling and redness at the back of the left elbow. Present for about 5 days. It was more swollen yesterday. There is some pain with flexion and extension of the elbow. It is not painful to touch.  Depression screen PHQ 2/9 02/04/2017 12/22/2016 10/20/2016  Decreased Interest 0 0 0  Down, Depressed, Hopeless 0 0 0  PHQ - 2 Score 0 0 0    History Evan [name] has a past medical history of Arthritis; Diabetes mellitus without complication (HCC); Gout; Hyperlipidemia; and Hypertension.   He has a past surgical history that includes Ankle arthroscopy (Right); Knee arthroscopy (Left); and Cyst removal hand.   His family history includes Alcohol abuse in his father; Diabetes in his mother; Early death in his father and paternal grandfather; Heart attack in his father and paternal grandfather; Heart disease in his father; Hypertension in his brother; Stroke in his mother.He reports that he has never smoked. He has never used smokeless tobacco. He reports that he drinks about 7.2 oz of alcohol per week . He reports that he does not use drugs.    ROS Review of Systems  Constitutional: Negative for chills, diaphoresis and fever.  HENT: Negative for rhinorrhea and sore throat.   Respiratory: Negative for cough and shortness of breath.   Cardiovascular: Negative for chest pain.  Gastrointestinal: Negative for abdominal pain.  Musculoskeletal: Negative for arthralgias and myalgias.  Skin: Negative for rash.  Neurological: Negative for weakness and headaches.    Objective:  BP (!) 152/91   Pulse (!) 106   Temp 98.4 F (36.9 C) (Oral)   Ht 6' 4.5" (1.943 m)   Wt 283 lb (128.4 kg)   BMI 34.00 kg/m   BP Readings from Last 3 Encounters:   02/04/17 (!) 152/91  12/22/16 137/83  10/20/16 (!) 153/94    Wt Readings from Last 3 Encounters:  02/04/17 283 lb (128.4 kg)  12/22/16 289 lb (131.1 kg)  11/18/16 288 lb 12.8 oz (131 kg)     Physical Exam  Constitutional: He is oriented to person, place, and time. He appears well-developed and well-nourished.  HENT:  Head: Normocephalic and atraumatic.  Right Ear: External ear normal.  Left Ear: External ear normal.  Mouth/Throat: No oropharyngeal exudate or posterior oropharyngeal erythema.  Eyes: Pupils are equal, round, and reactive to light.  Neck: Normal range of motion. Neck supple.  Cardiovascular: Normal rate and regular rhythm.   No murmur heard. Pulmonary/Chest: Breath sounds normal. No respiratory distress.  Musculoskeletal: He exhibits tenderness (Moderate at left olecranon bursa forPalpation and flexion/extension).  Neurological: He is alert and oriented to person, place, and time.  Skin: Skin is warm and dry. There is erythema (4 cm of erythema over the left olecranon bursa.).  Vitals reviewed.     Assessment & Plan:   Evan [name] was seen today for elbow pain.  Diagnoses and all orders for this visit:  Gout of left elbow, unspecified cause, unspecified chronicity -     Uric acid  Olecranon bursitis of left elbow -     betamethasone acetate-betamethasone sodium phosphate (CELESTONE) injection 6 mg; Inject 1 mL (6 mg total) into the muscle once. -       amoxicillin-clavulanate (AUGMENTIN) 875-125 MG tablet; Take 1 tablet by mouth 2 (two) times daily. -     CBC with Differential/Platelet -     CMP14+EGFR -     Uric acid       I have discontinued Mr. Masur bisacodyl and polyethylene glycol powder. I am also having him start on amoxicillin-clavulanate. Additionally, I am having him maintain his allopurinol, Fish Oil, BD PEN NEEDLE NANO U/F, BLOOD GLUCOSE TEST STRIPS, BYDUREON, benazepril, sildenafil, JARDIANCE, fenofibrate, and Insulin Detemir. We  administered betamethasone acetate-betamethasone sodium phosphate.  Allergies as of 02/04/2017   No Known Allergies     Medication List       Accurate as of 02/04/17  7:18 PM. Always use your most recent med list.          allopurinol 300 MG tablet Commonly known as:  ZYLOPRIM Take 300 mg by mouth daily.   amoxicillin-clavulanate 875-125 MG tablet Commonly known as:  AUGMENTIN Take 1 tablet by mouth 2 (two) times daily.   BD PEN NEEDLE NANO U/F 32G X 4 MM Misc Generic drug:  Insulin Pen Needle USE DAILY WITH INSULIN   benazepril 20 MG tablet Commonly known as:  LOTENSIN Take 1 Tablet by mouth once daily   BLOOD GLUCOSE TEST STRIPS Strp 1 strip by In Vitro route 2 (two) times daily.   BYDUREON 2 MG Pen Generic drug:  Exenatide ER inject TWO MG into THE SKIN ONCE a WEEK   fenofibrate 48 MG tablet Commonly known as:  TRICOR Take 1 tablet (48 mg total) by mouth daily.   Fish Oil 1000 MG Caps Take 2,000 mg by mouth daily.   Insulin Detemir 100 UNIT/ML Pen Commonly known as:  LEVEMIR FLEXPEN Inject 30 units daily at bedtime, up to 50 units as needed.   JARDIANCE 25 MG Tabs tablet Generic drug:  empagliflozin Take 1 Tablet by mouth once daily   sildenafil 20 MG tablet Commonly known as:  REVATIO TAKE ONE TABLET BY MOUTH AS NEEDED            Discharge Care Instructions        Start     Ordered   02/04/17 1715  BETAMETHASONE SOD PHOS & ACET 6 (3-3) MG/ML IJ SUSP   Once     02/04/17 1703   02/04/17 0000  amoxicillin-clavulanate (AUGMENTIN) 875-125 MG tablet  2 times daily     02/04/17 1703   02/04/17 0000  CBC with Differential/Platelet     02/04/17 1703   02/04/17 0000  CMP14+EGFR     02/04/17 1703   02/04/17 0000  Uric acid     02/04/17 1703       Follow-up: Return if symptoms worsen or fail to improve.  Claretta Fraise, M.D.

## 2017-02-05 LAB — CBC WITH DIFFERENTIAL/PLATELET
BASOS: 0 %
Basophils Absolute: 0 10*3/uL (ref 0.0–0.2)
EOS (ABSOLUTE): 0.1 10*3/uL (ref 0.0–0.4)
EOS: 1 %
HEMOGLOBIN: 16 g/dL (ref 13.0–17.7)
Hematocrit: 46.4 % (ref 37.5–51.0)
Immature Grans (Abs): 0 10*3/uL (ref 0.0–0.1)
Immature Granulocytes: 0 %
LYMPHS ABS: 2.4 10*3/uL (ref 0.7–3.1)
Lymphs: 17 %
MCH: 31.2 pg (ref 26.6–33.0)
MCHC: 34.5 g/dL (ref 31.5–35.7)
MCV: 90 fL (ref 79–97)
MONOCYTES: 5 %
MONOS ABS: 0.7 10*3/uL (ref 0.1–0.9)
NEUTROS ABS: 10.8 10*3/uL — AB (ref 1.4–7.0)
Neutrophils: 77 %
Platelets: 281 10*3/uL (ref 150–379)
RBC: 5.13 x10E6/uL (ref 4.14–5.80)
RDW: 13 % (ref 12.3–15.4)
WBC: 14.1 10*3/uL — ABNORMAL HIGH (ref 3.4–10.8)

## 2017-02-05 LAB — CMP14+EGFR
ALK PHOS: 96 IU/L (ref 39–117)
ALT: 15 IU/L (ref 0–44)
AST: 13 IU/L (ref 0–40)
Albumin/Globulin Ratio: 1.5 (ref 1.2–2.2)
Albumin: 4.6 g/dL (ref 3.5–5.5)
BILIRUBIN TOTAL: 0.4 mg/dL (ref 0.0–1.2)
BUN/Creatinine Ratio: 16 (ref 9–20)
BUN: 15 mg/dL (ref 6–24)
CHLORIDE: 100 mmol/L (ref 96–106)
CO2: 21 mmol/L (ref 20–29)
CREATININE: 0.95 mg/dL (ref 0.76–1.27)
Calcium: 9.4 mg/dL (ref 8.7–10.2)
GFR calc Af Amer: 107 mL/min/{1.73_m2} (ref >59)
GFR calc non Af Amer: 92 mL/min/{1.73_m2} (ref >59)
GLUCOSE: 109 mg/dL — AB (ref 65–99)
Globulin, Total: 3.1 g/dL (ref 1.5–4.5)
Potassium: 4 mmol/L (ref 3.5–5.2)
SODIUM: 140 mmol/L (ref 134–144)
Total Protein: 7.7 g/dL (ref 6.0–8.5)

## 2017-02-05 LAB — URIC ACID: URIC ACID: 5.9 mg/dL (ref 3.7–8.6)

## 2017-02-12 ENCOUNTER — Telehealth: Payer: Self-pay

## 2017-02-12 MED ORDER — VIAGRA 25 MG PO TABS: 25.0000 mg | ORAL_TABLET | Freq: Every day | ORAL | 0 refills | Status: DC | PRN

## 2017-02-12 NOTE — Telephone Encounter (Signed)
Brand Viagra is what [employer] will pay for

## 2017-02-12 NOTE — Telephone Encounter (Signed)
Getting a prior auth for Sildenafil   I do not see a DX of Erectile Dysfunction or Pulmonary HTN on Problem list or History   If DX Is erectile Dysfunction It is non preferred with [employer]  Preferred is Brand Viagra    Let me know

## 2017-02-12 NOTE — Telephone Encounter (Signed)
Now Medicaid is telling me that they do not pay for any erectile dysfunction drugs  Patient would be self pay

## 2017-02-12 NOTE — Telephone Encounter (Signed)
Prescription is for erectile dysfunction, if we have to send it is brand Viagra then we can but the generic is much cheaper for the patient usually.

## 2017-02-12 NOTE — Telephone Encounter (Signed)
That's what I thought, go ahead and send the generic sildenafil

## 2017-02-12 NOTE — Telephone Encounter (Signed)
Okay then send him the brand Viagra, 25 mg

## 2017-02-13 MED ORDER — SILDENAFIL CITRATE 20 MG PO TABS: 20.0000 mg | ORAL_TABLET | ORAL | 5 refills | Status: DC | PRN

## 2017-02-13 NOTE — Addendum Note (Signed)
Addended by: Marin Olp on: 02/13/2017 03:33 PM   Modules accepted: Orders

## 2017-02-13 NOTE — Telephone Encounter (Signed)
The same that I had sent originally sildenafil 20 mg with 6 refills, prescribed him 10 pills and use daily as needed

## 2017-02-13 NOTE — Telephone Encounter (Signed)
What directions, what strength, how many, how many refills?

## 2017-03-25 ENCOUNTER — Ambulatory Visit: Payer: Medicaid Other | Admitting: Family Medicine

## 2017-03-26 ENCOUNTER — Ambulatory Visit: Payer: Medicaid Other | Admitting: Family Medicine

## 2017-03-30 ENCOUNTER — Encounter: Payer: Self-pay | Admitting: Family Medicine

## 2017-04-06 ENCOUNTER — Ambulatory Visit: Payer: Medicaid Other | Admitting: Family Medicine

## 2017-04-06 ENCOUNTER — Encounter: Payer: Self-pay | Admitting: Family Medicine

## 2017-04-06 VITALS — BP 129/89 | HR 80 | Temp 97.7°F | Ht 76.5 in | Wt 287.0 lb

## 2017-04-06 DIAGNOSIS — Z23 Encounter for immunization: Secondary | ICD-10-CM | POA: Diagnosis not present

## 2017-04-06 DIAGNOSIS — E785 Hyperlipidemia, unspecified: Secondary | ICD-10-CM

## 2017-04-06 DIAGNOSIS — E669 Obesity, unspecified: Secondary | ICD-10-CM | POA: Diagnosis not present

## 2017-04-06 DIAGNOSIS — Z794 Long term (current) use of insulin: Secondary | ICD-10-CM | POA: Diagnosis not present

## 2017-04-06 DIAGNOSIS — M7022 Olecranon bursitis, left elbow: Secondary | ICD-10-CM

## 2017-04-06 DIAGNOSIS — E1159 Type 2 diabetes mellitus with other circulatory complications: Secondary | ICD-10-CM

## 2017-04-06 DIAGNOSIS — I10 Essential (primary) hypertension: Secondary | ICD-10-CM

## 2017-04-06 DIAGNOSIS — E66811 Obesity, class 1: Secondary | ICD-10-CM

## 2017-04-06 DIAGNOSIS — E11610 Type 2 diabetes mellitus with diabetic neuropathic arthropathy: Secondary | ICD-10-CM | POA: Diagnosis not present

## 2017-04-06 DIAGNOSIS — E1169 Type 2 diabetes mellitus with other specified complication: Secondary | ICD-10-CM | POA: Diagnosis not present

## 2017-04-06 DIAGNOSIS — E1165 Type 2 diabetes mellitus with hyperglycemia: Secondary | ICD-10-CM

## 2017-04-06 DIAGNOSIS — IMO0002 Reserved for concepts with insufficient information to code with codable children: Secondary | ICD-10-CM

## 2017-04-06 LAB — BAYER DCA HB A1C WAIVED: HB A1C: 7.3 % — AB (ref <7.0)

## 2017-04-06 MED ORDER — JARDIANCE 25 MG PO TABS: 25.0000 mg | ORAL_TABLET | Freq: Every day | ORAL | 3 refills | Status: DC

## 2017-04-06 MED ORDER — HYDROCODONE-ACETAMINOPHEN 7.5-325 MG PO TABS: 1.0000 | ORAL_TABLET | Freq: Three times a day (TID) | ORAL | 0 refills | Status: DC | PRN

## 2017-04-06 MED ORDER — BLOOD GLUCOSE TEST VI STRP: 1.0000 | ORAL_STRIP | Freq: Two times a day (BID) | 11 refills | Status: DC

## 2017-04-06 MED ORDER — BENAZEPRIL HCL 20 MG PO TABS: 20.0000 mg | ORAL_TABLET | Freq: Every day | ORAL | 4 refills | Status: DC

## 2017-04-06 MED ORDER — ALLOPURINOL 300 MG PO TABS: 300.0000 mg | ORAL_TABLET | Freq: Every day | ORAL | 3 refills | Status: DC

## 2017-04-06 NOTE — Progress Notes (Addendum)
BP 129/89   Pulse 80   Temp 97.7 F (36.5 C) (Oral)   Ht 6' 4.5" (1.943 m)   Wt 287 lb (130.2 kg)   BMI 34.48 kg/m    Subjective:    Patient ID: Evan [name], male    DOB: [birth year], 51 y.o.   MRN: [id number]  HPI: Evan [name] is a 51 y.o. male presenting on 04/06/2017 for Diabetes (3 mo follow up) and Hypertension   HPI Type 2 diabetes mellitus Patient comes in today for recheck of his diabetes. Patient has been currently taking Levemir and Jardiance, he was on Bydureon but he did not have it anymore. Patient is currently on an ACE inhibitor/ARB. Patient has not seen an ophthalmologist this year. Patient denies any issues with their feet.  Patient does have some arthritic pain that could be neuropathic in origin and could be associated with his diabetes.  His diabetes has been uncontrolled but he says the numbers have been running better recently and we will see what set today.  Hypertension Patient is currently on benazepril, and their blood pressure today is 129/89. Patient denies any lightheadedness or dizziness. Patient denies headaches, blurred vision, chest pains, shortness of breath, or weakness. Denies any side effects from medication and is content with current medication.   Hyperlipidemia Patient is coming in for recheck of his hyperlipidemia. The patient is currently taking fenofibrate and fish oil. They deny any issues with myalgias or history of liver damage from it. They deny any focal numbness or weakness or chest pain.   Left elbow swelling and pain Patient is coming in for left elbow pain and swelling and warmth that is been having over the past couple weeks.  He saw 1 of her other providers for this.  He denies any fevers or chills.  He was treated with an antibiotic.  He said it did improve but not fully.  Patient has been using ibuprofen and Biofreeze without much success.  Relevant past medical, surgical, family and social history reviewed and updated as  indicated. Interim medical history since our last visit reviewed. Allergies and medications reviewed and updated.  Review of Systems  Constitutional: Negative for chills and fever.  Respiratory: Negative for shortness of breath and wheezing.   Cardiovascular: Negative for chest pain and leg swelling.  Musculoskeletal: Positive for arthralgias and joint swelling. Negative for back pain and gait problem.  Skin: Positive for color change. Negative for rash.  Neurological: Negative for dizziness, weakness, light-headedness, numbness and headaches.  All other systems reviewed and are negative.   Per HPI unless specifically indicated above        Objective:    BP 129/89   Pulse 80   Temp 97.7 F (36.5 C) (Oral)   Ht 6' 4.5" (1.943 m)   Wt 287 lb (130.2 kg)   BMI 34.48 kg/m   Wt Readings from Last 3 Encounters:  04/06/17 287 lb (130.2 kg)  02/04/17 283 lb (128.4 kg)  12/22/16 289 lb (131.1 kg)    Physical Exam  Constitutional: He is oriented to person, place, and time. He appears well-developed and well-nourished. No distress.  Eyes: Conjunctivae are normal. No scleral icterus.  Neck: Neck supple. No thyromegaly present.  Cardiovascular: Normal rate, regular rhythm, normal heart sounds and intact distal pulses.  No murmur heard. Pulmonary/Chest: Effort normal and breath sounds normal. No respiratory distress. He has no wheezes. He has no rales.  Musculoskeletal: Normal range of motion. He exhibits no edema.  Left elbow: He exhibits swelling (Olecranon bursitis, patient has tenderness over the lateral epicondyle as well). He exhibits normal range of motion. Tenderness found. Lateral epicondyle (Pain worse with flexion of elbow) tenderness noted.  Lymphadenopathy:    He has no cervical adenopathy.  Neurological: He is alert and oriented to person, place, and time. Coordination normal.  Skin: Skin is warm and dry. No rash noted. He is not diaphoretic.  Psychiatric: He has a  normal mood and affect. His behavior is normal.  Nursing note and vitals reviewed.       Assessment & Plan:   Problem List Items Addressed This Visit      Cardiovascular and Mediastinum   Hypertension associated with diabetes ([place])   Relevant Medications   JARDIANCE 25 MG TABS tablet   benazepril (LOTENSIN) 20 MG tablet     Endocrine   Diabetes mellitus type 2, uncontrolled (HCC)   Relevant Medications   JARDIANCE 25 MG TABS tablet   Glucose Blood (BLOOD GLUCOSE TEST STRIPS) STRP   benazepril (LOTENSIN) 20 MG tablet   Other Relevant Orders   Bayer DCA Hb A1c Waived   Hyperlipidemia associated with type 2 diabetes mellitus (HCC) - Primary   Relevant Medications   JARDIANCE 25 MG TABS tablet   benazepril (LOTENSIN) 20 MG tablet     Other   Obesity (BMI 30.0-34.9)    Other Visit Diagnoses    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, with long-term current use of insulin (HCC)       Relevant Medications   JARDIANCE 25 MG TABS tablet   Glucose Blood (BLOOD GLUCOSE TEST STRIPS) STRP   benazepril (LOTENSIN) 20 MG tablet   Other Relevant Orders   Bayer DCA Hb A1c Waived   Olecranon bursitis of left elbow       Slight warmth and pain, could be gout or infection, patient had prednisone pack and will give Bactrim   Relevant Medications   HYDROcodone-acetaminophen (NORCO) 7.5-325 MG tablet       Follow up plan: Return in about 3 months (around 07/07/2017), or if symptoms worsen or fail to improve, for Diabetes and cholesterol and hypertension.  Counseling provided for all of the vaccine components Orders Placed This Encounter  Procedures  . Bayer [facility] Hb A1c [place], MD [facility] 04/06/2017, 1:48 PM

## 2017-04-08 MED ORDER — EXENATIDE ER 2 MG ~~LOC~~ PEN: PEN_INJECTOR | SUBCUTANEOUS | 4 refills | Status: DC

## 2017-04-08 NOTE — Addendum Note (Signed)
Addended by: Nigel Berthold C on: 04/08/2017 08:20 AM   Modules accepted: Orders

## 2017-07-08 ENCOUNTER — Encounter: Payer: Self-pay | Admitting: Family Medicine

## 2017-07-08 ENCOUNTER — Ambulatory Visit: Payer: Medicaid Other | Admitting: Family Medicine

## 2017-07-08 VITALS — BP 139/89 | HR 62 | Temp 97.1°F | Ht 76.5 in | Wt 294.0 lb

## 2017-07-08 DIAGNOSIS — Z794 Long term (current) use of insulin: Secondary | ICD-10-CM

## 2017-07-08 DIAGNOSIS — K2100 Gastro-esophageal reflux disease with esophagitis, without bleeding: Secondary | ICD-10-CM

## 2017-07-08 DIAGNOSIS — I10 Essential (primary) hypertension: Secondary | ICD-10-CM

## 2017-07-08 DIAGNOSIS — M1A0720 Idiopathic chronic gout, left ankle and foot, without tophus (tophi): Secondary | ICD-10-CM

## 2017-07-08 DIAGNOSIS — E119 Type 2 diabetes mellitus without complications: Secondary | ICD-10-CM | POA: Diagnosis not present

## 2017-07-08 DIAGNOSIS — E1159 Type 2 diabetes mellitus with other circulatory complications: Secondary | ICD-10-CM

## 2017-07-08 DIAGNOSIS — E785 Hyperlipidemia, unspecified: Secondary | ICD-10-CM

## 2017-07-08 DIAGNOSIS — E1169 Type 2 diabetes mellitus with other specified complication: Secondary | ICD-10-CM

## 2017-07-08 DIAGNOSIS — K219 Gastro-esophageal reflux disease without esophagitis: Secondary | ICD-10-CM | POA: Insufficient documentation

## 2017-07-08 LAB — BAYER DCA HB A1C WAIVED: HB A1C (BAYER DCA - WAIVED): 7.5 % — ABNORMAL HIGH (ref <7.0)

## 2017-07-08 MED ORDER — RANITIDINE HCL 150 MG PO TABS: 150.0000 mg | ORAL_TABLET | Freq: Two times a day (BID) | ORAL | 11 refills | Status: DC

## 2017-07-08 MED ORDER — HYDROCODONE-ACETAMINOPHEN 7.5-325 MG PO TABS: 1.0000 | ORAL_TABLET | Freq: Three times a day (TID) | ORAL | 0 refills | Status: DC | PRN

## 2017-07-08 MED ORDER — SILDENAFIL CITRATE 20 MG PO TABS: 20.0000 mg | ORAL_TABLET | ORAL | 5 refills | Status: DC | PRN

## 2017-07-08 MED ORDER — JARDIANCE 25 MG PO TABS: 25.0000 mg | ORAL_TABLET | Freq: Every day | ORAL | 3 refills | Status: DC

## 2017-07-08 MED ORDER — BLOOD GLUCOSE TEST VI STRP: 1.0000 | ORAL_STRIP | Freq: Two times a day (BID) | 11 refills | Status: DC

## 2017-07-08 NOTE — Assessment & Plan Note (Signed)
BMI of 35 with diabetes and hypertension and cholesterol, comorbidities

## 2017-07-08 NOTE — Progress Notes (Signed)
BP 139/89   Pulse 62   Temp (!) 97.1 F (36.2 C) (Oral)   Ht 6' 4.5" (1.943 m)   Wt 294 lb (133.4 kg)   BMI 35.32 kg/m    Subjective:    Patient ID: Evan [name], male    DOB: [birth year], 52 y.o.   MRN: [id number]  HPI: KAWHI DIEBOLD is a 52 y.o. male presenting on 07/08/2017 for Diabetes (3 mo); Hypertension; Hyperlipidemia; and Medication Refill (Norco and Sildenafil)   HPI Hypertension Patient is currently on allopurinol and benazepril, and their blood pressure today is 139/89. Patient denies any lightheadedness or dizziness. Patient denies headaches, blurred vision, chest pains, shortness of breath, or weakness. Denies any side effects from medication and is content with current medication.   Type 2 diabetes mellitus Patient comes in today for recheck of his diabetes. Patient has been currently taking Jardiance and Levemir 30 and Bydureon. Patient is currently on an ACE inhibitor/ARB. Patient has not seen an ophthalmologist this year. Patient has a hard callus on the bottom of his foot and would like to see podiatry for this.   Hyperlipidemia Patient is coming in for recheck of his hyperlipidemia. The patient is currently taking fenofibrate and omega-3's. They deny any issues with myalgias or history of liver damage from it. They deny any focal numbness or weakness or chest pain.   GERD Patient is currently on no medication but has been having a lot of issues recently and would like to try ranitidine which she has been on previously.  He has been having belching and burping and acid and burning and heartburn.  He denies any blood in his stool.  Chronic gout pain and chronic back pain Patient has been having chronic gout pain and low back pain and occasionally needs to use a Norco and just wants a refill for it.  He had 24 prescribed to him in November and has not needed a refill until recently.  He denies any radiation of his pain from his low back.  He says his gout is usually  in the ankle on the left side.  He says the pain is mild and is back today and he is currently not having any gout pain today.  Relevant past medical, surgical, family and social history reviewed and updated as indicated. Interim medical history since our last visit reviewed. Allergies and medications reviewed and updated.  Review of Systems  Constitutional: Negative for chills and fever.  Eyes: Negative for discharge.  Respiratory: Negative for shortness of breath and wheezing.   Cardiovascular: Negative for chest pain and leg swelling.  Gastrointestinal: Positive for abdominal pain. Negative for blood in stool, constipation, diarrhea, nausea and vomiting.  Musculoskeletal: Positive for arthralgias. Negative for back pain and gait problem.  Skin: Negative for rash.  Neurological: Negative for dizziness, weakness, light-headedness and headaches.  All other systems reviewed and are negative.   Per HPI unless specifically indicated above   Allergies as of 07/08/2017   No Known Allergies     Medication List        Accurate as of 07/08/17  1:38 PM. Always use your most recent med list.          allopurinol 300 MG tablet Commonly known as:  ZYLOPRIM Take 1 tablet (300 mg total) daily by mouth.   BD PEN NEEDLE NANO U/F 32G X 4 MM Misc Generic drug:  Insulin Pen Needle USE DAILY WITH INSULIN   benazepril 20 MG tablet Commonly  known as:  LOTENSIN Take 1 tablet (20 mg total) daily by mouth.   BLOOD GLUCOSE TEST STRIPS Strp 1 strip by In Vitro route 2 (two) times daily.   Exenatide ER 2 MG Pen Commonly known as:  BYDUREON inject TWO MG into THE SKIN ONCE a WEEK   fenofibrate 48 MG tablet Commonly known as:  TRICOR Take 1 tablet (48 mg total) by mouth daily.   Fish Oil 1000 MG Caps Take 2,000 mg by mouth daily.   HYDROcodone-acetaminophen 7.5-325 MG tablet Commonly known as:  NORCO Take 1 tablet by mouth every 8 (eight) hours as needed for moderate pain or severe  pain.   Insulin Detemir 100 UNIT/ML Pen Commonly known as:  LEVEMIR FLEXPEN Inject 30 units daily at bedtime, up to 50 units as needed.   JARDIANCE 25 MG Tabs tablet Generic drug:  empagliflozin Take 25 mg daily by mouth.   sildenafil 20 MG tablet Commonly known as:  REVATIO Take 1 tablet (20 mg total) by mouth as needed.          Objective:    BP 139/89   Pulse 62   Temp (!) 97.1 F (36.2 C) (Oral)   Ht 6' 4.5" (1.943 m)   Wt 294 lb (133.4 kg)   BMI 35.32 kg/m   Wt Readings from Last 3 Encounters:  07/08/17 294 lb (133.4 kg)  04/06/17 287 lb (130.2 kg)  02/04/17 283 lb (128.4 kg)    Physical Exam  Constitutional: He is oriented to person, place, and time. He appears well-developed and well-nourished. No distress.  Eyes: Conjunctivae are normal. No scleral icterus.  Neck: Neck supple. No thyromegaly present.  Cardiovascular: Normal rate, regular rhythm, normal heart sounds and intact distal pulses.  No murmur heard. Pulmonary/Chest: Effort normal and breath sounds normal. No respiratory distress. He has no wheezes. He has no rales.  Abdominal: Soft. Bowel sounds are normal. He exhibits no distension. There is no tenderness. There is no rebound and no guarding.  Musculoskeletal: Normal range of motion. He exhibits no edema.  Lymphadenopathy:    He has no cervical adenopathy.  Neurological: He is alert and oriented to person, place, and time. Coordination normal.  Skin: Skin is warm and dry. No rash noted. He is not diaphoretic.  Psychiatric: He has a normal mood and affect. His behavior is normal.  Nursing note and vitals reviewed.   Results for orders placed or performed in visit on 04/06/17  Bayer DCA Hb A1c Waived  Result Value Ref Range   Bayer DCA Hb A1c Waived 7.3 (H) <7.0 %      Assessment & Plan:   Problem List Items Addressed This Visit      Cardiovascular and Mediastinum   Hypertension associated with diabetes (HCC) - Primary   Relevant  Medications   sildenafil (REVATIO) 20 MG tablet   JARDIANCE 25 MG TABS tablet   Other Relevant Orders   CMP14+EGFR     Digestive   GERD (gastroesophageal reflux disease)   Relevant Medications   ranitidine (ZANTAC) 150 MG tablet     Endocrine   Type 2 diabetes mellitus without complications (HCC)   Relevant Medications   Glucose Blood (BLOOD GLUCOSE TEST STRIPS) STRP   JARDIANCE 25 MG TABS tablet   Other Relevant Orders   Bayer DCA Hb A1c Waived   CMP14+EGFR   Ambulatory referral to Podiatry   Hyperlipidemia associated with type 2 diabetes mellitus (HCC)   Relevant Medications   sildenafil (REVATIO) 20   MG tablet   JARDIANCE 25 MG TABS tablet   Other Relevant Orders   Lipid panel     Other   Gout   Relevant Medications   HYDROcodone-acetaminophen (NORCO) 7.5-325 MG tablet   Morbid obesity (HCC)    BMI of 35 with diabetes and hypertension and cholesterol, comorbidities      Relevant Medications   JARDIANCE 25 MG TABS tablet   Other Relevant Orders   Lipid panel       Follow up plan: Return in about 3 months (around 10/05/2017), or if symptoms worsen or fail to improve, for Diabetes and hypertension recheck.  Counseling provided for all of the vaccine components Orders Placed This Encounter  Procedures  . Bayer DCA Hb A1c Waived  . CMP14+EGFR  . Lipid panel    Joshua Dettinger, MD [facility] 07/08/2017, 1:38 PM     

## 2017-07-09 ENCOUNTER — Other Ambulatory Visit: Payer: Self-pay | Admitting: *Deleted

## 2017-07-09 DIAGNOSIS — E11610 Type 2 diabetes mellitus with diabetic neuropathic arthropathy: Secondary | ICD-10-CM

## 2017-07-09 DIAGNOSIS — IMO0002 Reserved for concepts with insufficient information to code with codable children: Secondary | ICD-10-CM

## 2017-07-09 DIAGNOSIS — E1165 Type 2 diabetes mellitus with hyperglycemia: Principal | ICD-10-CM

## 2017-07-09 DIAGNOSIS — Z794 Long term (current) use of insulin: Principal | ICD-10-CM

## 2017-07-09 LAB — CMP14+EGFR
ALK PHOS: 80 IU/L (ref 39–117)
ALT: 20 IU/L (ref 0–44)
AST: 17 IU/L (ref 0–40)
Albumin/Globulin Ratio: 1.3 (ref 1.2–2.2)
Albumin: 4.1 g/dL (ref 3.5–5.5)
BUN/Creatinine Ratio: 16 (ref 9–20)
BUN: 15 mg/dL (ref 6–24)
Bilirubin Total: <0.2 mg/dL (ref 0.0–1.2)
CALCIUM: 9.4 mg/dL (ref 8.7–10.2)
CO2: 17 mmol/L — AB (ref 20–29)
CREATININE: 0.92 mg/dL (ref 0.76–1.27)
Chloride: 105 mmol/L (ref 96–106)
GFR calc Af Amer: 111 mL/min/{1.73_m2} (ref >59)
GFR, EST NON AFRICAN AMERICAN: 96 mL/min/{1.73_m2} (ref >59)
GLOBULIN, TOTAL: 3.1 g/dL (ref 1.5–4.5)
GLUCOSE: 204 mg/dL — AB (ref 65–99)
Potassium: 4.1 mmol/L (ref 3.5–5.2)
SODIUM: 141 mmol/L (ref 134–144)
Total Protein: 7.2 g/dL (ref 6.0–8.5)

## 2017-07-09 LAB — LIPID PANEL
CHOL/HDL RATIO: 6.7 ratio — AB (ref 0.0–5.0)
CHOLESTEROL TOTAL: 181 mg/dL (ref 100–199)
HDL: 27 mg/dL — AB (ref >39)
Triglycerides: 513 mg/dL — ABNORMAL HIGH (ref 0–149)

## 2017-07-09 MED ORDER — BD PEN NEEDLE NANO U/F 32G X 4 MM MISC: 3 refills | Status: DC

## 2017-07-09 MED ORDER — FENOFIBRATE 145 MG PO TABS: 145.0000 mg | ORAL_TABLET | Freq: Every day | ORAL | 1 refills | Status: DC

## 2017-07-09 NOTE — Addendum Note (Signed)
Addended byCarrolyn Leigh on: 07/09/2017 01:38 PM   Modules accepted: Orders

## 2017-08-14 ENCOUNTER — Encounter: Payer: Self-pay | Admitting: Podiatry

## 2017-08-14 ENCOUNTER — Ambulatory Visit: Payer: Medicaid Other | Admitting: Podiatry

## 2017-08-14 ENCOUNTER — Ambulatory Visit (INDEPENDENT_AMBULATORY_CARE_PROVIDER_SITE_OTHER): Payer: Medicaid Other

## 2017-08-14 VITALS — BP 163/102 | HR 77

## 2017-08-14 DIAGNOSIS — L84 Corns and callosities: Secondary | ICD-10-CM

## 2017-08-14 DIAGNOSIS — M129 Arthropathy, unspecified: Secondary | ICD-10-CM | POA: Diagnosis not present

## 2017-08-14 DIAGNOSIS — M05772 Rheumatoid arthritis with rheumatoid factor of left ankle and foot without organ or systems involvement: Secondary | ICD-10-CM | POA: Diagnosis not present

## 2017-08-14 DIAGNOSIS — E119 Type 2 diabetes mellitus without complications: Secondary | ICD-10-CM

## 2017-08-14 NOTE — Progress Notes (Signed)
This patient presents the office with chief complaint of a painful callus on the bottom of the ball of his left foot.  He also says he has significant pain and discomfort as he walks.  This patient gives a history of significant athletic activity when he was young.  He talks about having injured his left foot and surgery was recommended to repair his left foot. He chose to forego the surgery and continue to return to athletics with having surgery.  He now presents to the office and has an antalgic gait due to the pain that he experiences in his left .  He also says he has significantly painful callus under the ball of his big toe left foot.  He presents the office today for an evaluation of his feet.  This patient also has diabetes with no foot complications.   General Appearance  Alert, conversant and in no acute stress.  Vascular  Dorsalis pedis and posterior tibial  pulses are palpable  bilaterally.  Capillary return is within normal limits  bilaterally. Temperature is within normal limits  bilaterally.  Neurologic  Senn-Weinstein monofilament wire test within normal limits  bilaterally. Muscle power within normal limits bilaterally.  Nails Normal nails noted with no evidence of fungal or bacterial infection.    Orthopedic  No limitations of motion of motion feet .  No crepitus or effusions noted.  Patient has no evidence of range of motion of the first MPJ left foot.  No evidence of any swelling or inflammation noted in the big toe joint, left foot.  Skin  normotropic skin with no porokeratosis noted bilaterally.  No signs of infections or ulcers noted.  Callus noted  Sub 1st MPJ left foot under the tibial sesamoid left foot.  Callus limited limited ROM 1st MPJ left foot.  Chronic arthritic changes Liz-Frank left foot.   IE  X-rays taken reveal narrowing of the first MPJ of the left foot. This is indicativef of arthritis big toe joint, left foot.  Significant arthritic changes noted at the [facility] joint, left foot.  Debridement of callus left foot.  Told this patient that I could trim the callus for him, which  Ihe appreciates since he is diabetic. 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  RTC prn.   Gardiner Barefoot DPM

## 2017-10-05 ENCOUNTER — Ambulatory Visit: Payer: Medicaid Other | Admitting: Family Medicine

## 2017-10-05 ENCOUNTER — Encounter: Payer: Self-pay | Admitting: Family Medicine

## 2017-10-05 VITALS — BP 137/89 | HR 82 | Temp 98.0°F | Ht 76.5 in | Wt 284.0 lb

## 2017-10-05 DIAGNOSIS — K2100 Gastro-esophageal reflux disease with esophagitis, without bleeding: Secondary | ICD-10-CM

## 2017-10-05 DIAGNOSIS — E1159 Type 2 diabetes mellitus with other circulatory complications: Secondary | ICD-10-CM

## 2017-10-05 DIAGNOSIS — E1169 Type 2 diabetes mellitus with other specified complication: Secondary | ICD-10-CM | POA: Diagnosis not present

## 2017-10-05 DIAGNOSIS — Z794 Long term (current) use of insulin: Secondary | ICD-10-CM | POA: Diagnosis not present

## 2017-10-05 DIAGNOSIS — I10 Essential (primary) hypertension: Secondary | ICD-10-CM

## 2017-10-05 DIAGNOSIS — M1A0720 Idiopathic chronic gout, left ankle and foot, without tophus (tophi): Secondary | ICD-10-CM | POA: Diagnosis not present

## 2017-10-05 DIAGNOSIS — E785 Hyperlipidemia, unspecified: Secondary | ICD-10-CM

## 2017-10-05 LAB — BAYER DCA HB A1C WAIVED: HB A1C: 6.9 % (ref <7.0)

## 2017-10-05 MED ORDER — SILDENAFIL CITRATE 20 MG PO TABS: 20.0000 mg | ORAL_TABLET | ORAL | 5 refills | Status: DC | PRN

## 2017-10-05 MED ORDER — HYDROCODONE-ACETAMINOPHEN 7.5-325 MG PO TABS: 1.0000 | ORAL_TABLET | Freq: Three times a day (TID) | ORAL | 0 refills | Status: DC | PRN

## 2017-10-05 NOTE — Progress Notes (Signed)
BP 137/89   Pulse 82   Temp 98 F (36.7 C) (Oral)   Ht 6' 4.5" (1.943 m)   Wt 284 lb (128.8 kg)   BMI 34.12 kg/m    Subjective:    Patient ID: Evan [name], male    DOB: 25-Jan-1966, 52 y.o.   MRN: [id number]  HPI: Evan [name] is a 52 y.o. male presenting on 10/05/2017 for Hypertension; Diabetes; and Hyperlipidemia   HPI Type 2 diabetes mellitus Patient comes in today for recheck of his diabetes. Patient has been currently taking Levemir 30 at bedtime and Jardiance 25. Patient is currently on an ACE inhibitor/ARB. Patient has not seen an ophthalmologist this year. Patient denies any issues with their feet.   Hyperlipidemia Patient is coming in for recheck of his hyperlipidemia. The patient is currently taking fenofibrate and omega-3's. They deny any issues with myalgias or history of liver damage from it. They deny any focal numbness or weakness or chest pain.   Hypertension Patient is currently on benazepril, and their blood pressure today is 137/89. Patient denies any lightheadedness or dizziness. Patient denies headaches, blurred vision, chest pains, shortness of breath, or weakness. Denies any side effects from medication and is content with current medication.   GERD Patient is currently on ranitidine.  She denies any major symptoms or abdominal pain or belching or burping. She denies any blood in her stool or lightheadedness or dizziness.   Relevant past medical, surgical, family and social history reviewed and updated as indicated. Interim medical history since our last visit reviewed. Allergies and medications reviewed and updated.  Review of Systems  Constitutional: Negative for chills and fever.  Eyes: Negative for discharge.  Respiratory: Negative for shortness of breath and wheezing.   Cardiovascular: Negative for chest pain and leg swelling.  Gastrointestinal: Negative for abdominal pain.  Musculoskeletal: Negative for back pain and gait problem.  Skin:  Negative for rash.  Neurological: Negative for dizziness, weakness and headaches.  All other systems reviewed and are negative.   Per HPI unless specifically indicated above   Allergies as of 10/05/2017   No Known Allergies     Medication List        Accurate as of 10/05/17  2:31 PM. Always use your most recent med list.          allopurinol 300 MG tablet Commonly known as:  ZYLOPRIM Take 1 tablet (300 mg total) daily by mouth.   BD PEN NEEDLE NANO U/F 32G X 4 MM Misc Generic drug:  Insulin Pen Needle USE DAILY WITH INSULIN   benazepril 20 MG tablet Commonly known as:  LOTENSIN Take 1 tablet (20 mg total) daily by mouth.   BLOOD GLUCOSE TEST STRIPS Strp 1 strip by In Vitro route 2 (two) times daily.   Exenatide ER 2 MG Pen Commonly known as:  BYDUREON inject TWO MG into THE SKIN ONCE a WEEK   fenofibrate 145 MG tablet Commonly known as:  TRICOR Take 1 tablet (145 mg total) by mouth daily.   Fish Oil 1000 MG Caps Take 2,000 mg by mouth daily.   HYDROcodone-acetaminophen 7.5-325 MG tablet Commonly known as:  NORCO Take 1 tablet by mouth every 8 (eight) hours as needed for moderate pain or severe pain.   Insulin Detemir 100 UNIT/ML Pen Commonly known as:  LEVEMIR FLEXPEN Inject 30 units daily at bedtime, up to 50 units as needed.   JARDIANCE 25 MG Tabs tablet Generic drug:  empagliflozin Take 25 mg  by mouth daily.   ranitidine 150 MG tablet Commonly known as:  ZANTAC Take 1 tablet (150 mg total) by mouth 2 (two) times daily.   sildenafil 20 MG tablet Commonly known as:  REVATIO Take 1 tablet (20 mg total) by mouth as needed.          Objective:    BP 137/89   Pulse 82   Temp 98 F (36.7 C) (Oral)   Ht 6' 4.5" (1.943 m)   Wt 284 lb (128.8 kg)   BMI 34.12 kg/m   Wt Readings from Last 3 Encounters:  10/05/17 284 lb (128.8 kg)  07/08/17 294 lb (133.4 kg)  04/06/17 287 lb (130.2 kg)    Physical Exam  Constitutional: He is oriented to person,  place, and time. He appears well-developed and well-nourished. No distress.  Eyes: Conjunctivae are normal. No scleral icterus.  Neck: Neck supple. No thyromegaly present.  Cardiovascular: Normal rate, regular rhythm, normal heart sounds and intact distal pulses.  No murmur heard. Pulmonary/Chest: Effort normal and breath sounds normal. No respiratory distress. He has no wheezes.  Musculoskeletal: Normal range of motion. He exhibits no edema.  Lymphadenopathy:    He has no cervical adenopathy.  Neurological: He is alert and oriented to person, place, and time. Coordination normal.  Skin: Skin is warm and dry. No rash noted. He is not diaphoretic.  Psychiatric: He has a normal mood and affect. His behavior is normal.  Nursing note and vitals reviewed.       Assessment & Plan:   Problem List Items Addressed This Visit      Cardiovascular and Mediastinum   Hypertension associated with diabetes ([place])   Relevant Orders   CMP14+EGFR     Digestive   GERD (gastroesophageal reflux disease)   Relevant Orders   CBC with Differential/Platelet     Endocrine   Type 2 diabetes mellitus without complications ([place]) - Primary   Relevant Orders   Microalbumin / creatinine urine ratio   Bayer DCA Hb A1c Waived   CMP14+EGFR   Bayer DCA Hb A1c Waived   Hyperlipidemia associated with type 2 diabetes mellitus ([place])   Relevant Orders   Lipid panel     Other   Gout   Relevant Medications   HYDROcodone-acetaminophen (NORCO) 7.5-325 MG tablet   Morbid obesity ([place])      Patient has diabetes with hyperlipidemia and hypertension, continue current medications, if A1c is up then will restart Bydureon   Follow up plan: Return in about 3 months (around 01/05/2018), or if symptoms worsen or fail to improve, for Recheck diabetes and cholesterol.  Counseling provided for all of the vaccine components Orders Placed This Encounter  Procedures  . Microalbumin / creatinine urine ratio  . Bayer [facility]  Hb A1c [place], MD [facility] 10/05/2017, 2:31 PM

## 2017-10-06 LAB — MICROALBUMIN / CREATININE URINE RATIO
CREATININE, UR: 42.9 mg/dL
MICROALBUM., U, RANDOM: 7.3 ug/mL
Microalb/Creat Ratio: 17 mg/g creat (ref 0.0–30.0)

## 2018-01-05 ENCOUNTER — Encounter: Payer: Self-pay | Admitting: Family Medicine

## 2018-01-05 ENCOUNTER — Ambulatory Visit: Payer: Medicaid Other | Admitting: Family Medicine

## 2018-01-05 VITALS — BP 135/87 | HR 73 | Temp 97.5°F | Ht 76.5 in | Wt 291.2 lb

## 2018-01-05 DIAGNOSIS — Z794 Long term (current) use of insulin: Secondary | ICD-10-CM

## 2018-01-05 DIAGNOSIS — E669 Obesity, unspecified: Secondary | ICD-10-CM

## 2018-01-05 DIAGNOSIS — E785 Hyperlipidemia, unspecified: Secondary | ICD-10-CM | POA: Diagnosis not present

## 2018-01-05 DIAGNOSIS — I10 Essential (primary) hypertension: Secondary | ICD-10-CM | POA: Diagnosis not present

## 2018-01-05 DIAGNOSIS — M1A0720 Idiopathic chronic gout, left ankle and foot, without tophus (tophi): Secondary | ICD-10-CM

## 2018-01-05 DIAGNOSIS — E1169 Type 2 diabetes mellitus with other specified complication: Secondary | ICD-10-CM | POA: Diagnosis not present

## 2018-01-05 DIAGNOSIS — E1159 Type 2 diabetes mellitus with other circulatory complications: Secondary | ICD-10-CM | POA: Diagnosis not present

## 2018-01-05 DIAGNOSIS — E119 Type 2 diabetes mellitus without complications: Secondary | ICD-10-CM

## 2018-01-05 DIAGNOSIS — E66811 Obesity, class 1: Secondary | ICD-10-CM

## 2018-01-05 LAB — CMP14+EGFR
ALBUMIN: 4.7 g/dL (ref 3.5–5.5)
ALK PHOS: 80 IU/L (ref 39–117)
ALT: 12 IU/L (ref 0–44)
AST: 10 IU/L (ref 0–40)
Albumin/Globulin Ratio: 1.8 (ref 1.2–2.2)
BUN/Creatinine Ratio: 17 (ref 9–20)
BUN: 17 mg/dL (ref 6–24)
Bilirubin Total: 0.3 mg/dL (ref 0.0–1.2)
CO2: 21 mmol/L (ref 20–29)
CREATININE: 1 mg/dL (ref 0.76–1.27)
Calcium: 9.6 mg/dL (ref 8.7–10.2)
Chloride: 102 mmol/L (ref 96–106)
GFR calc Af Amer: 100 mL/min/{1.73_m2} (ref >59)
GFR calc non Af Amer: 86 mL/min/{1.73_m2} (ref >59)
Globulin, Total: 2.6 g/dL (ref 1.5–4.5)
Glucose: 215 mg/dL — ABNORMAL HIGH (ref 65–99)
Potassium: 4 mmol/L (ref 3.5–5.2)
Sodium: 139 mmol/L (ref 134–144)
Total Protein: 7.3 g/dL (ref 6.0–8.5)

## 2018-01-05 LAB — CBC WITH DIFFERENTIAL/PLATELET
Basophils Absolute: 0.1 10*3/uL (ref 0.0–0.2)
Basos: 1 %
EOS (ABSOLUTE): 0.2 10*3/uL (ref 0.0–0.4)
Eos: 3 %
HEMATOCRIT: 46.4 % (ref 37.5–51.0)
Hemoglobin: 15.2 g/dL (ref 13.0–17.7)
Immature Grans (Abs): 0 10*3/uL (ref 0.0–0.1)
Immature Granulocytes: 0 %
LYMPHS ABS: 2.2 10*3/uL (ref 0.7–3.1)
Lymphs: 24 %
MCH: 30 pg (ref 26.6–33.0)
MCHC: 32.8 g/dL (ref 31.5–35.7)
MCV: 92 fL (ref 79–97)
Monocytes Absolute: 0.6 10*3/uL (ref 0.1–0.9)
Monocytes: 7 %
NEUTROS PCT: 65 %
Neutrophils Absolute: 5.9 10*3/uL (ref 1.4–7.0)
Platelets: 260 10*3/uL (ref 150–450)
RBC: 5.07 x10E6/uL (ref 4.14–5.80)
RDW: 13.5 % (ref 12.3–15.4)
WBC: 9 10*3/uL (ref 3.4–10.8)

## 2018-01-05 LAB — LIPID PANEL
CHOL/HDL RATIO: 6.2 ratio — AB (ref 0.0–5.0)
CHOLESTEROL TOTAL: 206 mg/dL — AB (ref 100–199)
HDL: 33 mg/dL — ABNORMAL LOW (ref >39)
LDL CALC: 119 mg/dL — AB (ref 0–99)
Triglycerides: 268 mg/dL — ABNORMAL HIGH (ref 0–149)
VLDL Cholesterol Cal: 54 mg/dL — ABNORMAL HIGH (ref 5–40)

## 2018-01-05 LAB — BAYER DCA HB A1C WAIVED: HB A1C (BAYER DCA - WAIVED): 8.1 % — ABNORMAL HIGH (ref <7.0)

## 2018-01-05 MED ORDER — JARDIANCE 25 MG PO TABS: 25.0000 mg | ORAL_TABLET | Freq: Every day | ORAL | 3 refills | Status: DC

## 2018-01-05 MED ORDER — HYDROCODONE-ACETAMINOPHEN 7.5-325 MG PO TABS: 1.0000 | ORAL_TABLET | Freq: Three times a day (TID) | ORAL | 0 refills | Status: DC | PRN

## 2018-01-05 NOTE — Progress Notes (Signed)
BP 135/87   Pulse 73   Temp (!) 97.5 F (36.4 C) (Oral)   Ht 6' 4.5" (1.943 m)   Wt 291 lb 3.2 oz (132.1 kg)   BMI 34.98 kg/m    Subjective:    Patient ID: Evan [name], male    DOB: 1966/04/03, 52 y.o.   MRN: [id number]  HPI: Evan [name] is a 52 y.o. male presenting on 01/05/2018 for Diabetes (3 month follow up- Patient states that he is having more joint pain than nornal) and Hypertension   HPI Type 2 diabetes mellitus Patient comes in today for recheck of his diabetes. Patient has been currently taking Levemir 30 at bedtime and Jardiance. Patient is currently on an ACE inhibitor/ARB. Patient has not seen an ophthalmologist this year. Patient denies any issues with their feet.   Hypertension Patient is currently on benazepril, and their blood pressure today is 135/87. Patient denies any lightheadedness or dizziness. Patient denies headaches, blurred vision, chest pains, shortness of breath, or weakness. Denies any side effects from medication and is content with current medication.   Hyperlipidemia Patient is coming in for recheck of his hyperlipidemia. The patient is currently taking fenofibrate and omega-3s. They deny any issues with myalgias or history of liver damage from it. They deny any focal numbness or weakness or chest pain.   Gout Patient has a history of gout in his left ankle has been doing very well since he has been on the allopurinol and needs a refill for today.  He denies any recent flares of the gout.  Relevant past medical, surgical, family and social history reviewed and updated as indicated. Interim medical history since our last visit reviewed. Allergies and medications reviewed and updated.  Review of Systems  Constitutional: Negative for chills and fever.  Eyes: Negative for visual disturbance.  Respiratory: Negative for shortness of breath and wheezing.   Cardiovascular: Negative for chest pain and leg swelling.  Musculoskeletal: Negative for  arthralgias, back pain, gait problem and joint swelling.  Skin: Negative for color change and rash.  Neurological: Negative for dizziness, weakness, light-headedness and headaches.  All other systems reviewed and are negative.   Per HPI unless specifically indicated above   Allergies as of 01/05/2018   No Known Allergies     Medication List        Accurate as of 01/05/18  8:53 AM. Always use your most recent med list.          allopurinol 300 MG tablet Commonly known as:  ZYLOPRIM Take 1 tablet (300 mg total) daily by mouth.   BD PEN NEEDLE NANO U/F 32G X 4 MM Misc Generic drug:  Insulin Pen Needle USE DAILY WITH INSULIN   benazepril 20 MG tablet Commonly known as:  LOTENSIN Take 1 tablet (20 mg total) daily by mouth.   BLOOD GLUCOSE TEST STRIPS Strp 1 strip by In Vitro route 2 (two) times daily.   Exenatide ER 2 MG Pen inject TWO MG into THE SKIN ONCE a WEEK   fenofibrate 145 MG tablet Commonly known as:  TRICOR Take 1 tablet (145 mg total) by mouth daily.   Fish Oil 1000 MG Caps Take 2,000 mg by mouth daily.   HYDROcodone-acetaminophen 7.5-325 MG tablet Commonly known as:  NORCO Take 1 tablet by mouth every 8 (eight) hours as needed for moderate pain or severe pain.   Insulin Detemir 100 UNIT/ML Pen Commonly known as:  LEVEMIR Inject 30 units daily at bedtime, up  to 50 units as needed.   JARDIANCE 25 MG Tabs tablet Generic drug:  empagliflozin Take 25 mg by mouth daily.   ranitidine 150 MG tablet Commonly known as:  ZANTAC Take 1 tablet (150 mg total) by mouth 2 (two) times daily.   sildenafil 20 MG tablet Commonly known as:  REVATIO Take 1 tablet (20 mg total) by mouth as needed.          Objective:    BP 135/87   Pulse 73   Temp (!) 97.5 F (36.4 C) (Oral)   Ht 6' 4.5" (1.943 m)   Wt 291 lb 3.2 oz (132.1 kg)   BMI 34.98 kg/m   Wt Readings from Last 3 Encounters:  01/05/18 291 lb 3.2 oz (132.1 kg)  10/05/17 284 lb (128.8 kg)    07/08/17 294 lb (133.4 kg)    Physical Exam  Constitutional: He is oriented to person, place, and time. He appears well-developed and well-nourished. No distress.  Eyes: Conjunctivae are normal. No scleral icterus.  Neck: Neck supple. No thyromegaly present.  Cardiovascular: Normal rate, regular rhythm, normal heart sounds and intact distal pulses.  No murmur heard. Pulmonary/Chest: Effort normal and breath sounds normal. No respiratory distress. He has no wheezes.  Musculoskeletal: Normal range of motion. He exhibits no edema.  Lymphadenopathy:    He has no cervical adenopathy.  Neurological: He is alert and oriented to person, place, and time. Coordination normal.  Skin: Skin is warm and dry. No rash noted. He is not diaphoretic.  Psychiatric: He has a normal mood and affect. His behavior is normal.  Nursing note and vitals reviewed.       Assessment & Plan:   Problem List Items Addressed This Visit      Cardiovascular and Mediastinum   Hypertension associated with diabetes ([place])   Relevant Medications   JARDIANCE 25 MG TABS tablet     Endocrine   Type 2 diabetes mellitus without complications ([place]) - Primary   Relevant Medications   JARDIANCE 25 MG TABS tablet   Other Relevant Orders   CMP14+EGFR (Completed)   Bayer DCA Hb A1c Waived (Completed)   Hyperlipidemia associated with type 2 diabetes mellitus (HCC)   Relevant Medications   JARDIANCE 25 MG TABS tablet   Other Relevant Orders   Lipid panel (Completed)     Other   Gout   Relevant Medications   HYDROcodone-acetaminophen (NORCO) 7.5-325 MG tablet   Other Relevant Orders   CBC with Differential/Platelet (Completed)   Obesity (BMI 30.0-34.9)      Continue Jardiance and Levemir for diabetes, gave refill of medications for gout, will do labs today. Follow up plan: Return in about 3 months (around 04/07/2018), or if symptoms worsen or fail to improve, for Follow-up diabetes.  Counseling provided for all of  the vaccine components No orders of the defined types were placed in this encounter.   Caryl Pina, MD [facility] 01/05/2018, 8:53 AM

## 2018-01-07 ENCOUNTER — Telehealth: Payer: Self-pay | Admitting: Family Medicine

## 2018-01-07 ENCOUNTER — Telehealth: Payer: Self-pay | Admitting: *Deleted

## 2018-01-07 NOTE — Telephone Encounter (Signed)
-----   Message from Worthy Rancher, MD sent at 01/06/2018 12:51 PM EDT ----- Patient's triglycerides are still elevated but are down from before and his blood sugar has increased significantly again, his A1c is 8.1.  Refocus on his dietary changes and make sure is been consistent about taking his medications every day, no med changes for now but will recheck in 3 months, bring meter in when you come next time

## 2018-01-07 NOTE — Telephone Encounter (Signed)
Pt notified of results Verbalizes understanding 

## 2018-01-19 ENCOUNTER — Other Ambulatory Visit: Payer: Self-pay | Admitting: Family Medicine

## 2018-01-19 DIAGNOSIS — IMO0002 Reserved for concepts with insufficient information to code with codable children: Secondary | ICD-10-CM

## 2018-01-19 DIAGNOSIS — Z794 Long term (current) use of insulin: Principal | ICD-10-CM

## 2018-01-19 DIAGNOSIS — E11610 Type 2 diabetes mellitus with diabetic neuropathic arthropathy: Secondary | ICD-10-CM

## 2018-01-19 DIAGNOSIS — E1165 Type 2 diabetes mellitus with hyperglycemia: Principal | ICD-10-CM

## 2018-03-04 ENCOUNTER — Other Ambulatory Visit: Payer: Self-pay | Admitting: Family Medicine

## 2018-03-04 DIAGNOSIS — E11610 Type 2 diabetes mellitus with diabetic neuropathic arthropathy: Secondary | ICD-10-CM

## 2018-03-04 DIAGNOSIS — Z794 Long term (current) use of insulin: Principal | ICD-10-CM

## 2018-03-04 DIAGNOSIS — E1165 Type 2 diabetes mellitus with hyperglycemia: Principal | ICD-10-CM

## 2018-03-04 DIAGNOSIS — IMO0002 Reserved for concepts with insufficient information to code with codable children: Secondary | ICD-10-CM

## 2018-03-05 ENCOUNTER — Other Ambulatory Visit: Payer: Self-pay | Admitting: Family Medicine

## 2018-03-05 DIAGNOSIS — IMO0002 Reserved for concepts with insufficient information to code with codable children: Secondary | ICD-10-CM

## 2018-03-05 DIAGNOSIS — E11610 Type 2 diabetes mellitus with diabetic neuropathic arthropathy: Secondary | ICD-10-CM

## 2018-03-05 DIAGNOSIS — E1165 Type 2 diabetes mellitus with hyperglycemia: Principal | ICD-10-CM

## 2018-03-05 DIAGNOSIS — Z794 Long term (current) use of insulin: Principal | ICD-10-CM

## 2018-04-07 ENCOUNTER — Ambulatory Visit: Payer: Medicaid Other | Admitting: Family Medicine

## 2018-04-07 ENCOUNTER — Encounter: Payer: Self-pay | Admitting: Family Medicine

## 2018-04-07 VITALS — BP 135/74 | HR 76 | Temp 96.9°F | Ht 76.5 in | Wt 284.8 lb

## 2018-04-07 DIAGNOSIS — E1142 Type 2 diabetes mellitus with diabetic polyneuropathy: Secondary | ICD-10-CM

## 2018-04-07 DIAGNOSIS — M1A0720 Idiopathic chronic gout, left ankle and foot, without tophus (tophi): Secondary | ICD-10-CM

## 2018-04-07 DIAGNOSIS — Z794 Long term (current) use of insulin: Secondary | ICD-10-CM

## 2018-04-07 DIAGNOSIS — E11610 Type 2 diabetes mellitus with diabetic neuropathic arthropathy: Secondary | ICD-10-CM | POA: Diagnosis not present

## 2018-04-07 DIAGNOSIS — E1165 Type 2 diabetes mellitus with hyperglycemia: Secondary | ICD-10-CM | POA: Diagnosis not present

## 2018-04-07 DIAGNOSIS — Z23 Encounter for immunization: Secondary | ICD-10-CM

## 2018-04-07 DIAGNOSIS — K2100 Gastro-esophageal reflux disease with esophagitis, without bleeding: Secondary | ICD-10-CM

## 2018-04-07 DIAGNOSIS — IMO0002 Reserved for concepts with insufficient information to code with codable children: Secondary | ICD-10-CM

## 2018-04-07 LAB — BAYER DCA HB A1C WAIVED: HB A1C: 7.7 % — AB (ref <7.0)

## 2018-04-07 MED ORDER — RANITIDINE HCL 150 MG PO TABS: 150.0000 mg | ORAL_TABLET | Freq: Two times a day (BID) | ORAL | 11 refills | Status: DC

## 2018-04-07 MED ORDER — HYDROCODONE-ACETAMINOPHEN 7.5-325 MG PO TABS: 1.0000 | ORAL_TABLET | Freq: Three times a day (TID) | ORAL | 0 refills | Status: DC | PRN

## 2018-04-07 MED ORDER — JARDIANCE 25 MG PO TABS: 25.0000 mg | ORAL_TABLET | Freq: Every day | ORAL | 6 refills | Status: DC

## 2018-04-07 MED ORDER — BENAZEPRIL HCL 20 MG PO TABS: 20.0000 mg | ORAL_TABLET | Freq: Every day | ORAL | 5 refills | Status: DC

## 2018-04-07 MED ORDER — SILDENAFIL CITRATE 20 MG PO TABS: 20.0000 mg | ORAL_TABLET | ORAL | 5 refills | Status: DC | PRN

## 2018-04-07 NOTE — Progress Notes (Signed)
BP 135/74   Pulse 76   Temp (!) 96.9 F (36.1 C) (Oral)   Ht 6' 4.5" (1.943 m)   Wt 284 lb 12.8 oz (129.2 kg)   BMI 34.22 kg/m    Subjective:    Patient ID: Evan [name], male    DOB: [birth year], 52 y.o.   MRN: [id number]  HPI: Evan [name] is a 52 y.o. male presenting on 04/07/2018 for Diabetes (3 month follow up); Hypertension; and Hyperlipidemia   HPI Type 2 diabetes mellitus Patient comes in today for recheck of his diabetes. Patient has been currently taking Jardiance and Levemir 30. Patient is currently on an ACE inhibitor/ARB. Patient has not seen an ophthalmologist this year. Patient denies any issues with their feet.   GERD Patient is currently on ranitidine.  She denies any major symptoms or abdominal pain or belching or burping. She denies any blood in her stool or lightheadedness or dizziness.   Gout and chronic arthritis pain Patient has chronic gout and arthritis and he takes an occasional hydrocodone when he cuts them in half to space them out and gets about 45 of them every 3 months.  He says it helps when he gets little flare ups.  He denies any issues today but he says he gets them about every other day.  He is on disability because of his chronic arthritis and recurrent gout.  Relevant past medical, surgical, family and social history reviewed and updated as indicated. Interim medical history since our last visit reviewed. Allergies and medications reviewed and updated.  Review of Systems  Constitutional: Negative for chills and fever.  Eyes: Negative for visual disturbance.  Respiratory: Negative for shortness of breath and wheezing.   Cardiovascular: Negative for chest pain and leg swelling.  Gastrointestinal: Negative for abdominal distention, abdominal pain, constipation, diarrhea and vomiting.  Musculoskeletal: Positive for arthralgias. Negative for back pain and gait problem.  Skin: Negative for rash.  Neurological: Negative for dizziness,  weakness, light-headedness and numbness.  All other systems reviewed and are negative.   Per HPI unless specifically indicated above   Allergies as of 04/07/2018   No Known Allergies     Medication List        Accurate as of 04/07/18  9:17 AM. Always use your most recent med list.          allopurinol 300 MG tablet Commonly known as:  ZYLOPRIM Take 1 tablet (300 mg total) daily by mouth.   BD PEN NEEDLE NANO U/F 32G X 4 MM Misc Generic drug:  Insulin Pen Needle USE DAILY WITH INSULIN   benazepril 20 MG tablet Commonly known as:  LOTENSIN Take 1 tablet (20 mg total) by mouth daily.   BLOOD GLUCOSE TEST STRIPS Strp 1 strip by In Vitro route 2 (two) times daily.   fenofibrate 145 MG tablet Commonly known as:  TRICOR Take 1 tablet (145 mg total) by mouth daily.   Fish Oil 1000 MG Caps Take 2,000 mg by mouth daily.   HYDROcodone-acetaminophen 7.5-325 MG tablet Commonly known as:  NORCO Take 1 tablet by mouth every 8 (eight) hours as needed for moderate pain or severe pain.   JARDIANCE 25 MG Tabs tablet Generic drug:  empagliflozin Take 25 mg by mouth daily.   LEVEMIR FLEXTOUCH 100 UNIT/ML Pen Generic drug:  Insulin Detemir INJECT 30 UNITS DAILY AT BEDTIME - UP TO 50 UNITS AS NEEDED   ranitidine 150 MG tablet Commonly known as:  ZANTAC Take 1 tablet (  150 mg total) by mouth 2 (two) times daily.   sildenafil 20 MG tablet Commonly known as:  REVATIO Take 1 tablet (20 mg total) by mouth as needed.          Objective:    BP 135/74   Pulse 76   Temp (!) 96.9 F (36.1 C) (Oral)   Ht 6' 4.5" (1.943 m)   Wt 284 lb 12.8 oz (129.2 kg)   BMI 34.22 kg/m   Wt Readings from Last 3 Encounters:  04/07/18 284 lb 12.8 oz (129.2 kg)  01/05/18 291 lb 3.2 oz (132.1 kg)  10/05/17 284 lb (128.8 kg)    Physical Exam  Constitutional: He is oriented to person, place, and time. He appears well-developed and well-nourished. No distress.  Eyes: Pupils are equal, round,  and reactive to light. Conjunctivae and EOM are normal. No scleral icterus.  Neck: Neck supple. No thyromegaly present.  Cardiovascular: Normal rate, regular rhythm, normal heart sounds and intact distal pulses.  No murmur heard. Pulmonary/Chest: Effort normal and breath sounds normal. No respiratory distress. He has no wheezes.  Musculoskeletal: Normal range of motion. He exhibits no edema.  Lymphadenopathy:    He has no cervical adenopathy.  Neurological: He is alert and oriented to person, place, and time. Coordination normal.  Skin: Skin is warm and dry. No rash noted. He is not diaphoretic.  Psychiatric: He has a normal mood and affect. His behavior is normal.  Nursing note and vitals reviewed.       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  Follow up plan: Return in about 3 months (around 07/08/2018), or if symptoms worsen or fail to improve, for Recheck diabetes.  Counseling provided for all of the vaccine components Orders Placed This Encounter  Procedures  . Bayer DCA Hb A1c Waived  . Lipid panel  . BMP8+EGFR     Caryl Pina, MD [facility] 04/07/2018, 9:17 AM

## 2018-04-08 LAB — LIPID PANEL
CHOLESTEROL TOTAL: 169 mg/dL (ref 100–199)
Chol/HDL Ratio: 5.5 ratio — ABNORMAL HIGH (ref 0.0–5.0)
HDL: 31 mg/dL — ABNORMAL LOW (ref >39)
LDL CALC: 101 mg/dL — AB (ref 0–99)
TRIGLYCERIDES: 184 mg/dL — AB (ref 0–149)
VLDL Cholesterol Cal: 37 mg/dL (ref 5–40)

## 2018-04-08 LAB — BMP8+EGFR
BUN/Creatinine Ratio: 14 (ref 9–20)
BUN: 14 mg/dL (ref 6–24)
CO2: 19 mmol/L — AB (ref 20–29)
Calcium: 9.1 mg/dL (ref 8.7–10.2)
Chloride: 103 mmol/L (ref 96–106)
Creatinine, Ser: 1.03 mg/dL (ref 0.76–1.27)
GFR calc Af Amer: 96 mL/min/{1.73_m2} (ref >59)
GFR, EST NON AFRICAN AMERICAN: 83 mL/min/{1.73_m2} (ref >59)
Glucose: 268 mg/dL — ABNORMAL HIGH (ref 65–99)
Potassium: 3.9 mmol/L (ref 3.5–5.2)
SODIUM: 139 mmol/L (ref 134–144)

## 2018-05-06 ENCOUNTER — Telehealth: Payer: Self-pay | Admitting: Family Medicine

## 2018-05-06 NOTE — Telephone Encounter (Signed)
Pharmacy [facility]  Pt has called in saying pharmacy told him that it would be 5 days until they could do anything for his med refill. Pt is needing refill on LEVEMIR FLEXTOUCH 100 UNIT/ML Pen

## 2018-05-07 ENCOUNTER — Other Ambulatory Visit: Payer: Self-pay | Admitting: Family Medicine

## 2018-05-07 DIAGNOSIS — IMO0002 Reserved for concepts with insufficient information to code with codable children: Secondary | ICD-10-CM

## 2018-05-07 DIAGNOSIS — E11610 Type 2 diabetes mellitus with diabetic neuropathic arthropathy: Secondary | ICD-10-CM

## 2018-05-07 DIAGNOSIS — Z794 Long term (current) use of insulin: Principal | ICD-10-CM

## 2018-05-07 DIAGNOSIS — E1165 Type 2 diabetes mellitus with hyperglycemia: Principal | ICD-10-CM

## 2018-05-07 NOTE — Telephone Encounter (Signed)
Pt aware refill sent in this morning

## 2018-05-13 ENCOUNTER — Other Ambulatory Visit: Payer: Self-pay | Admitting: Family Medicine

## 2018-07-09 ENCOUNTER — Encounter: Payer: Self-pay | Admitting: Family Medicine

## 2018-07-09 ENCOUNTER — Ambulatory Visit: Payer: Medicaid Other | Admitting: Family Medicine

## 2018-07-09 VITALS — BP 137/84 | HR 82 | Temp 97.2°F | Ht 76.5 in | Wt 279.6 lb

## 2018-07-09 DIAGNOSIS — I10 Essential (primary) hypertension: Secondary | ICD-10-CM

## 2018-07-09 DIAGNOSIS — E669 Obesity, unspecified: Secondary | ICD-10-CM | POA: Diagnosis not present

## 2018-07-09 DIAGNOSIS — K2100 Gastro-esophageal reflux disease with esophagitis, without bleeding: Secondary | ICD-10-CM

## 2018-07-09 DIAGNOSIS — E785 Hyperlipidemia, unspecified: Secondary | ICD-10-CM | POA: Diagnosis not present

## 2018-07-09 DIAGNOSIS — Z794 Long term (current) use of insulin: Secondary | ICD-10-CM | POA: Diagnosis not present

## 2018-07-09 DIAGNOSIS — S86911A Strain of unspecified muscle(s) and tendon(s) at lower leg level, right leg, initial encounter: Secondary | ICD-10-CM | POA: Diagnosis not present

## 2018-07-09 DIAGNOSIS — E1159 Type 2 diabetes mellitus with other circulatory complications: Secondary | ICD-10-CM

## 2018-07-09 DIAGNOSIS — M1A0720 Idiopathic chronic gout, left ankle and foot, without tophus (tophi): Secondary | ICD-10-CM | POA: Diagnosis not present

## 2018-07-09 DIAGNOSIS — Z23 Encounter for immunization: Secondary | ICD-10-CM | POA: Diagnosis not present

## 2018-07-09 DIAGNOSIS — E1169 Type 2 diabetes mellitus with other specified complication: Secondary | ICD-10-CM

## 2018-07-09 LAB — BAYER DCA HB A1C WAIVED: HB A1C (BAYER DCA - WAIVED): 8 % — ABNORMAL HIGH (ref <7.0)

## 2018-07-09 MED ORDER — HYDROCODONE-ACETAMINOPHEN 7.5-325 MG PO TABS: 1.0000 | ORAL_TABLET | Freq: Three times a day (TID) | ORAL | 0 refills | Status: DC | PRN

## 2018-07-09 MED ORDER — RANITIDINE HCL 150 MG PO TABS: 150.0000 mg | ORAL_TABLET | Freq: Two times a day (BID) | ORAL | 11 refills | Status: DC

## 2018-07-09 NOTE — Addendum Note (Signed)
Addended by: Nigel Berthold C on: 07/09/2018 10:09 AM   Modules accepted: Orders

## 2018-07-09 NOTE — Progress Notes (Signed)
BP 137/84   Pulse 82   Temp (!) 97.2 F (36.2 C) (Oral)   Ht 6' 4.5" (1.943 m)   Wt 279 lb 9.6 oz (126.8 kg)   BMI 33.59 kg/m    Subjective:    Patient ID: Evan [name], male    DOB: 1965/10/23, 53 y.o.   MRN: [id number]  HPI: Evan [name] is a 53 y.o. male presenting on 07/09/2018 for Diabetes (3 month follow up); Hypertension; and Knee Pain (right. Patient states he fell in a hole x 1 month ago and states it is still painful and swololen.)   HPI Type 2 diabetes mellitus Patient comes in today for recheck of his diabetes. Patient has been currently taking Jardiance and Levemir, he had run out of a little bit of his Levemir and the Jardiance for couple weeks so he thinks his A1c will be up and we will check that but he does have them all back, it required a prior authorization with the new year. Patient is currently on an ACE inhibitor/ARB. Patient has not seen an ophthalmologist this year. Patient denies any issues with their feet.   GERD Patient is currently on ranitidine.  She denies any major symptoms or abdominal pain or belching or burping. She denies any blood in her stool or lightheadedness or dizziness.   Hypertension Patient is currently on benazepril, and their blood pressure today is 137/84. Patient denies any lightheadedness or dizziness. Patient denies headaches, blurred vision, chest pains, shortness of breath, or weakness. Denies any side effects from medication and is content with current medication.   Hyperlipidemia Patient is coming in for recheck of his hyperlipidemia. The patient is currently taking fenofibrate and omega-3's. They deny any issues with myalgias or history of liver damage from it. They deny any focal numbness or weakness or chest pain.   Right knee strain Patient is coming in complaining of right knee strain that he sustained a month ago that still bothers him a little bit and it is mild at this point, hurts on the right posterior aspect of the  knee and he also complains of a Baker's cyst.  He denies any popping or catching or giving away but it is still just sore.  He says is more stiff if he sitting for long period time and gets up and is little stiff but he does want to get it checked out for sure.  He denies any weakness or numbness in that leg.  Relevant past medical, surgical, family and social history reviewed and updated as indicated. Interim medical history since our last visit reviewed. Allergies and medications reviewed and updated.  Review of Systems  Constitutional: Negative for chills and fever.  Eyes: Negative for visual disturbance.  Respiratory: Negative for shortness of breath and wheezing.   Cardiovascular: Negative for chest pain and leg swelling.  Musculoskeletal: Positive for arthralgias and myalgias. Negative for back pain and gait problem.  Skin: Negative for rash.  Neurological: Negative for dizziness, seizures, weakness and numbness.  All other systems reviewed and are negative.   Per HPI unless specifically indicated above   Allergies as of 07/09/2018   No Known Allergies     Medication List       Accurate as of July 09, 2018  9:13 AM. Always use your most recent med list.        allopurinol 300 MG tablet Commonly known as:  ZYLOPRIM Take 1 tablet (300 mg total) daily by mouth.   BD  PEN NEEDLE NANO U/F 32G X 4 MM Misc Generic drug:  Insulin Pen Needle USE DAILY WITH INSULIN   benazepril 20 MG tablet Commonly known as:  LOTENSIN Take 1 tablet (20 mg total) by mouth daily.   BLOOD GLUCOSE TEST STRIPS Strp 1 strip by In Vitro route 2 (two) times daily.   fenofibrate 145 MG tablet Commonly known as:  TRICOR TAKE 1 TABLET BY MOUTH EVERY DAY   Fish Oil 1000 MG Caps Take 2,000 mg by mouth daily.   HYDROcodone-acetaminophen 7.5-325 MG tablet Commonly known as:  NORCO Take 1 tablet by mouth every 8 (eight) hours as needed for moderate pain or severe pain.   JARDIANCE 25 MG Tabs  tablet Generic drug:  empagliflozin Take 25 mg by mouth daily.   LEVEMIR FLEXTOUCH 100 UNIT/ML Pen Generic drug:  Insulin Detemir INJECT 30 UNITS DAILY AT BEDTIME - UP TO 50 UNITS AS NEEDED   ranitidine 150 MG tablet Commonly known as:  ZANTAC Take 1 tablet (150 mg total) by mouth 2 (two) times daily.   sildenafil 20 MG tablet Commonly known as:  REVATIO Take 1 tablet (20 mg total) by mouth as needed.          Objective:    BP 137/84   Pulse 82   Temp (!) 97.2 F (36.2 C) (Oral)   Ht 6' 4.5" (1.943 m)   Wt 279 lb 9.6 oz (126.8 kg)   BMI 33.59 kg/m   Wt Readings from Last 3 Encounters:  07/09/18 279 lb 9.6 oz (126.8 kg)  04/07/18 284 lb 12.8 oz (129.2 kg)  01/05/18 291 lb 3.2 oz (132.1 kg)    Physical Exam Vitals signs and nursing note reviewed.  Constitutional:      General: He is not in acute distress.    Appearance: He is well-developed. He is not diaphoretic.  Eyes:     General: No scleral icterus.    Conjunctiva/sclera: Conjunctivae normal.  Neck:     Musculoskeletal: Neck supple.     Thyroid: No thyromegaly.  Cardiovascular:     Rate and Rhythm: Normal rate and regular rhythm.     Heart sounds: Normal heart sounds. No murmur.  Pulmonary:     Effort: Pulmonary effort is normal. No respiratory distress.     Breath sounds: Normal breath sounds. No wheezing.  Musculoskeletal:     Right knee: He exhibits normal range of motion, no swelling, no deformity, no LCL laxity, normal patellar mobility and no MCL laxity. Tenderness (Tenderness overlying the lateral posterior aspect of the knee over the tendon from the hamstring.  Likely a strain) found. No medial joint line, no lateral joint line, no MCL, no LCL and no patellar tendon tenderness noted.  Lymphadenopathy:     Cervical: No cervical adenopathy.  Skin:    General: Skin is warm and dry.     Findings: No rash.  Neurological:     Mental Status: He is alert and oriented to person, place, and time.      Coordination: Coordination normal.  Psychiatric:        Behavior: Behavior normal.         Assessment & Plan:   Problem List Items Addressed This Visit      Cardiovascular and Mediastinum   Hypertension associated with diabetes ([place])     Digestive   GERD (gastroesophageal reflux disease)   Relevant Medications   ranitidine (ZANTAC) 150 MG tablet     Endocrine   Type 2 diabetes  mellitus without complications ([place]) - Primary   Hyperlipidemia associated with type 2 diabetes mellitus ([place])     Other   Gout   Relevant Medications   HYDROcodone-acetaminophen (NORCO) 7.5-325 MG tablet   Obesity (BMI 30.0-34.9)    Other Visit Diagnoses    Knee strain, right, initial encounter       Conservative management, just a strain of the musculature likely       Follow up plan: Return in about 3 months (around 10/07/2018), or if symptoms worsen or fail to improve, for Diabetes and hypertension and cholesterol.  Counseling provided for all of the vaccine components Orders Placed This Encounter  Procedures  . [facility] Hb A1c Waived    Caryl Pina, MD [facility] 07/09/2018, 9:13 AM

## 2018-08-09 ENCOUNTER — Other Ambulatory Visit: Payer: Self-pay | Admitting: Family Medicine

## 2018-08-09 DIAGNOSIS — E119 Type 2 diabetes mellitus without complications: Secondary | ICD-10-CM

## 2018-08-09 DIAGNOSIS — Z794 Long term (current) use of insulin: Principal | ICD-10-CM

## 2018-09-30 ENCOUNTER — Other Ambulatory Visit: Payer: Self-pay

## 2018-09-30 MED ORDER — FAMOTIDINE 20 MG PO TABS: 20.0000 mg | ORAL_TABLET | Freq: Two times a day (BID) | ORAL | 1 refills | Status: DC

## 2018-09-30 NOTE — Telephone Encounter (Signed)
[facility] [place] [facility]'s [facility].  This medication is discontinued and is no longer available.  [facility]am[facility] 20 mg.  If you agree, please sign prescription and send to pharmacy.

## 2018-10-07 ENCOUNTER — Other Ambulatory Visit: Payer: Self-pay

## 2018-10-08 ENCOUNTER — Ambulatory Visit: Payer: Medicaid Other | Admitting: Family Medicine

## 2018-10-08 ENCOUNTER — Encounter: Payer: Self-pay | Admitting: Family Medicine

## 2018-10-08 VITALS — BP 132/79 | HR 89 | Temp 97.4°F | Ht 76.5 in | Wt 282.8 lb

## 2018-10-08 DIAGNOSIS — Z0289 Encounter for other administrative examinations: Secondary | ICD-10-CM | POA: Diagnosis not present

## 2018-10-08 DIAGNOSIS — E11610 Type 2 diabetes mellitus with diabetic neuropathic arthropathy: Secondary | ICD-10-CM | POA: Diagnosis not present

## 2018-10-08 DIAGNOSIS — Z794 Long term (current) use of insulin: Secondary | ICD-10-CM | POA: Diagnosis not present

## 2018-10-08 DIAGNOSIS — IMO0002 Reserved for concepts with insufficient information to code with codable children: Secondary | ICD-10-CM

## 2018-10-08 DIAGNOSIS — E1165 Type 2 diabetes mellitus with hyperglycemia: Secondary | ICD-10-CM | POA: Diagnosis not present

## 2018-10-08 DIAGNOSIS — E119 Type 2 diabetes mellitus without complications: Secondary | ICD-10-CM

## 2018-10-08 DIAGNOSIS — F1120 Opioid dependence, uncomplicated: Secondary | ICD-10-CM

## 2018-10-08 DIAGNOSIS — M1A0720 Idiopathic chronic gout, left ankle and foot, without tophus (tophi): Secondary | ICD-10-CM | POA: Diagnosis not present

## 2018-10-08 LAB — BAYER DCA HB A1C WAIVED: HB A1C (BAYER DCA - WAIVED): 7.1 % — ABNORMAL HIGH (ref <7.0)

## 2018-10-08 MED ORDER — BD PEN NEEDLE NANO U/F 32G X 4 MM MISC: 3 refills | Status: DC

## 2018-10-08 MED ORDER — INSULIN DETEMIR 100 UNIT/ML FLEXPEN: PEN_INJECTOR | SUBCUTANEOUS | 3 refills | Status: DC

## 2018-10-08 MED ORDER — HYDROCODONE-ACETAMINOPHEN 7.5-325 MG PO TABS: 1.0000 | ORAL_TABLET | Freq: Three times a day (TID) | ORAL | 0 refills | Status: DC | PRN

## 2018-10-08 NOTE — Progress Notes (Signed)
BP 132/79   Pulse 89   Temp (!) 97.4 F (36.3 C) (Oral)   Ht 6' 4.5" (1.943 m)   Wt 282 lb 12.8 oz (128.3 kg)   BMI 33.97 kg/m    Subjective:   Patient ID: Evan [name], male    DOB: 03-01-1966, 53 y.o.   MRN: [id number]  HPI: Evan [name] is a 53 y.o. male presenting on 10/08/2018 for Hypertension (3 month follow up); Diabetes; and Ear Pain (right - x 1 week)   HPI Pain assessment: Cause of pain-neuropathy and chronic gout Pain location-feet and ankles Pain on scale of 1-10- 1 Frequency-every few days flares up, does not take every day What increases pain-gout flares What makes pain Better-anti-inflammatories and the pain medication Effects on ADL -minimal, does very well when it is not flared up Any change in general medical condition-none  Current opioids rx-hydrocodone 7.5-325 every 8 hours as needed # meds rx-45 Effectiveness of current meds-works very well when he uses it Adverse reactions form pain meds-none Morphine equivalent- 22.5  Pill count performed-No Last drug screen -never ( high risk q33m, moderate risk q67m, low risk yearly ) Urine drug screen today- Yes Was the [place] reviewed-yes  If yes were their any concerning findings? -None  No flowsheet data found.   Pain contract signed on: 10/08/2018  Type 2 diabetes mellitus Patient comes in today for recheck of his diabetes. Patient has been currently taking Jardiance and Levemir 30. Patient is currently on an ACE inhibitor/ARB. Patient has not seen an ophthalmologist this year. Patient denies any new issues with their feet.  Patient does have neuropathy but it is mostly managed.  Relevant past medical, surgical, family and social history reviewed and updated as indicated. Interim medical history since our last visit reviewed. Allergies and medications reviewed and updated.  Review of Systems  Constitutional: Negative for chills and fever.  Eyes: Negative for visual disturbance.  Respiratory:  Negative for shortness of breath and wheezing.   Cardiovascular: Negative for chest pain and leg swelling.  Musculoskeletal: Negative for back pain and gait problem.  Skin: Negative for rash.  Neurological: Positive for numbness. Negative for dizziness, weakness and light-headedness.  All other systems reviewed and are negative.   Per HPI unless specifically indicated above   Allergies as of 10/08/2018   No Known Allergies     Medication List       Accurate as of Oct 08, 2018  9:37 AM. If you have any questions, ask your nurse or doctor.        Accu-Chek Aviva Plus test strip Generic drug:  glucose blood USE TO CHECK SUGAR TWICE DAILY   allopurinol 300 MG tablet Commonly known as:  ZYLOPRIM Take 1 tablet (300 mg total) daily by mouth.   BD Pen Needle Nano U/F 32G X 4 MM Misc Generic drug:  Insulin Pen Needle USE DAILY WITH INSULIN   benazepril 20 MG tablet Commonly known as:  LOTENSIN Take 1 tablet (20 mg total) by mouth daily.   famotidine 20 MG tablet Commonly known as:  Pepcid Take 1 tablet (20 mg total) by mouth 2 (two) times daily.   fenofibrate 145 MG tablet Commonly known as:  TRICOR TAKE 1 TABLET BY MOUTH EVERY DAY   Fish Oil 1000 MG Caps Take 2,000 mg by mouth daily.   HYDROcodone-acetaminophen 7.5-325 MG tablet Commonly known as:  NORCO Take 1 tablet by mouth every 8 (eight) hours as needed for moderate pain or severe  pain.   Insulin Detemir 100 UNIT/ML Pen Commonly known as:  Levemir FlexTouch INJECT 30 UNITS DAILY AT BEDTIME - UP TO 50 UNITS AS NEEDED   Jardiance 25 MG Tabs tablet Generic drug:  empagliflozin Take 25 mg by mouth daily.   ranitidine 150 MG tablet Commonly known as:  Zantac Take 1 tablet (150 mg total) by mouth 2 (two) times daily.   sildenafil 20 MG tablet Commonly known as:  REVATIO Take 1 tablet (20 mg total) by mouth as needed.        Objective:   BP 132/79   Pulse 89   Temp (!) 97.4 F (36.3 C) (Oral)   Ht  6' 4.5" (1.943 m)   Wt 282 lb 12.8 oz (128.3 kg)   BMI 33.97 kg/m   Wt Readings from Last 3 Encounters:  10/08/18 282 lb 12.8 oz (128.3 kg)  07/09/18 279 lb 9.6 oz (126.8 kg)  04/07/18 284 lb 12.8 oz (129.2 kg)    Physical Exam Vitals signs and nursing note reviewed.  Constitutional:      General: He is not in acute distress.    Appearance: He is well-developed. He is not diaphoretic.  Eyes:     General: No scleral icterus.    Conjunctiva/sclera: Conjunctivae normal.  Neck:     Musculoskeletal: Neck supple.     Thyroid: No thyromegaly.  Cardiovascular:     Rate and Rhythm: Normal rate and regular rhythm.     Heart sounds: Normal heart sounds. No murmur.  Pulmonary:     Effort: Pulmonary effort is normal. No respiratory distress.     Breath sounds: Normal breath sounds. No wheezing.  Musculoskeletal: Normal range of motion.        General: Tenderness (Joint tenderness in both ankles with range of motion) present.  Lymphadenopathy:     Cervical: No cervical adenopathy.  Skin:    General: Skin is warm and dry.     Findings: No rash.  Neurological:     Mental Status: He is alert and oriented to person, place, and time.     Coordination: Coordination normal.  Psychiatric:        Behavior: Behavior normal.     Diabetic Foot Exam - Simple   Simple Foot Form Diabetic Foot exam was performed with the following findings:  Yes 10/08/2018  9:35 AM  Visual Inspection No deformities, no ulcerations, no other skin breakdown bilaterally:  Yes Sensation Testing Intact to touch and monofilament testing bilaterally:  Yes Pulse Check Posterior Tibialis and Dorsalis pulse intact bilaterally:  Yes Comments Patient does have a callus on the sole of his left foot near the forefoot pad on the medial aspect that does not open currently      Assessment & Plan:   Problem List Items Addressed This Visit      Endocrine   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) - Primary    Relevant Medications   Insulin Detemir (LEVEMIR FLEXTOUCH) 100 UNIT/ML Pen   BD PEN NEEDLE NANO U/F 32G X 4 MM MISC     Other   Gout   Relevant Medications   HYDROcodone-acetaminophen (NORCO) 7.5-325 MG tablet   Other Relevant Orders   ToxASSURE Select 13 ([place]), Urine   Pain medication agreement signed   Relevant Medications   HYDROcodone-acetaminophen (NORCO) 7.5-325 MG tablet   Opiate dependence (Uncertain)   Relevant Medications   HYDROcodone-acetaminophen (NORCO) 7.5-325 MG tablet    Other Visit Diagnoses    Uncontrolled  type 2 diabetes mellitus with diabetic neuropathic arthropathy, with long-term current use of insulin (HCC)       Relevant Medications   Insulin Detemir (LEVEMIR FLEXTOUCH) 100 UNIT/ML Pen   BD PEN NEEDLE NANO U/F 32G X 4 MM MISC   Other Relevant Orders   Bayer DCA Hb A1c Waived      Pain contract signed today, will do refill, continue Levemir will check A1c today.  Follow up plan: Return in about 3 months (around 01/08/2019), or if symptoms worsen or fail to improve, for Recheck diabetes.  Counseling provided for all of the vaccine components Orders Placed This Encounter  Procedures  . ToxASSURE Select 13 ([place]), Urine    Caryl Pina, MD [place] 10/08/2018, 9:37 AM

## 2018-10-14 LAB — TOXASSURE SELECT 13 (MW), URINE

## 2018-10-18 DIAGNOSIS — I219 Acute myocardial infarction, unspecified: Secondary | ICD-10-CM

## 2018-10-18 HISTORY — DX: Acute myocardial infarction, unspecified: I21.9

## 2018-11-23 DIAGNOSIS — Z86711 Personal history of pulmonary embolism: Secondary | ICD-10-CM | POA: Diagnosis not present

## 2018-11-23 DIAGNOSIS — J90 Pleural effusion, not elsewhere classified: Secondary | ICD-10-CM | POA: Diagnosis not present

## 2018-11-23 DIAGNOSIS — R0600 Dyspnea, unspecified: Secondary | ICD-10-CM | POA: Diagnosis not present

## 2018-11-23 DIAGNOSIS — Z7984 Long term (current) use of oral hypoglycemic drugs: Secondary | ICD-10-CM | POA: Diagnosis not present

## 2018-11-23 DIAGNOSIS — E119 Type 2 diabetes mellitus without complications: Secondary | ICD-10-CM | POA: Diagnosis not present

## 2018-11-23 DIAGNOSIS — R7989 Other specified abnormal findings of blood chemistry: Secondary | ICD-10-CM | POA: Diagnosis not present

## 2018-11-23 DIAGNOSIS — K219 Gastro-esophageal reflux disease without esophagitis: Secondary | ICD-10-CM | POA: Diagnosis not present

## 2018-11-23 DIAGNOSIS — J810 Acute pulmonary edema: Secondary | ICD-10-CM | POA: Diagnosis not present

## 2018-11-23 DIAGNOSIS — I214 Non-ST elevation (NSTEMI) myocardial infarction: Secondary | ICD-10-CM | POA: Diagnosis not present

## 2018-11-23 DIAGNOSIS — R079 Chest pain, unspecified: Secondary | ICD-10-CM | POA: Diagnosis not present

## 2018-11-23 DIAGNOSIS — R0602 Shortness of breath: Secondary | ICD-10-CM | POA: Diagnosis not present

## 2018-11-23 DIAGNOSIS — Z79899 Other long term (current) drug therapy: Secondary | ICD-10-CM | POA: Diagnosis not present

## 2018-11-23 DIAGNOSIS — I10 Essential (primary) hypertension: Secondary | ICD-10-CM | POA: Diagnosis not present

## 2018-11-24 ENCOUNTER — Inpatient Hospital Stay (HOSPITAL_COMMUNITY): Payer: Medicaid Other

## 2018-11-24 ENCOUNTER — Encounter (HOSPITAL_COMMUNITY): Payer: Self-pay | Admitting: Student

## 2018-11-24 ENCOUNTER — Inpatient Hospital Stay (HOSPITAL_COMMUNITY)
Admission: EM | Admit: 2018-11-24 | Discharge: 2018-11-28 | DRG: 246 | Disposition: A | Discharge status: Home / Self Care | Payer: Medicaid Other | Source: Other Acute Inpatient Hospital | Attending: Internal Medicine | Admitting: Internal Medicine

## 2018-11-24 ENCOUNTER — Inpatient Hospital Stay (HOSPITAL_COMMUNITY)
Admission: EM | Disposition: A | Discharge status: Home / Self Care | Payer: Self-pay | Source: Other Acute Inpatient Hospital | Attending: Internal Medicine

## 2018-11-24 DIAGNOSIS — F1010 Alcohol abuse, uncomplicated: Secondary | ICD-10-CM | POA: Diagnosis not present

## 2018-11-24 DIAGNOSIS — Z833 Family history of diabetes mellitus: Secondary | ICD-10-CM

## 2018-11-24 DIAGNOSIS — Z811 Family history of alcohol abuse and dependence: Secondary | ICD-10-CM

## 2018-11-24 DIAGNOSIS — I519 Heart disease, unspecified: Secondary | ICD-10-CM

## 2018-11-24 DIAGNOSIS — I255 Ischemic cardiomyopathy: Secondary | ICD-10-CM | POA: Diagnosis present

## 2018-11-24 DIAGNOSIS — Z20828 Contact with and (suspected) exposure to other viral communicable diseases: Secondary | ICD-10-CM | POA: Diagnosis not present

## 2018-11-24 DIAGNOSIS — I152 Hypertension secondary to endocrine disorders: Secondary | ICD-10-CM | POA: Diagnosis not present

## 2018-11-24 DIAGNOSIS — Z794 Long term (current) use of insulin: Secondary | ICD-10-CM | POA: Diagnosis not present

## 2018-11-24 DIAGNOSIS — I237 Postinfarction angina: Secondary | ICD-10-CM | POA: Diagnosis not present

## 2018-11-24 DIAGNOSIS — E66813 Obesity, class 3: Secondary | ICD-10-CM | POA: Diagnosis present

## 2018-11-24 DIAGNOSIS — E1169 Type 2 diabetes mellitus with other specified complication: Secondary | ICD-10-CM | POA: Diagnosis not present

## 2018-11-24 DIAGNOSIS — I5021 Acute systolic (congestive) heart failure: Secondary | ICD-10-CM | POA: Diagnosis present

## 2018-11-24 DIAGNOSIS — F1110 Opioid abuse, uncomplicated: Secondary | ICD-10-CM | POA: Diagnosis not present

## 2018-11-24 DIAGNOSIS — E1159 Type 2 diabetes mellitus with other circulatory complications: Secondary | ICD-10-CM | POA: Diagnosis present

## 2018-11-24 DIAGNOSIS — I472 Ventricular tachycardia: Secondary | ICD-10-CM | POA: Diagnosis present

## 2018-11-24 DIAGNOSIS — I10 Essential (primary) hypertension: Secondary | ICD-10-CM | POA: Diagnosis present

## 2018-11-24 DIAGNOSIS — E119 Type 2 diabetes mellitus without complications: Secondary | ICD-10-CM | POA: Diagnosis not present

## 2018-11-24 DIAGNOSIS — R079 Chest pain, unspecified: Secondary | ICD-10-CM | POA: Diagnosis not present

## 2018-11-24 DIAGNOSIS — F1410 Cocaine abuse, uncomplicated: Secondary | ICD-10-CM | POA: Diagnosis present

## 2018-11-24 DIAGNOSIS — M109 Gout, unspecified: Secondary | ICD-10-CM | POA: Diagnosis present

## 2018-11-24 DIAGNOSIS — G4730 Sleep apnea, unspecified: Secondary | ICD-10-CM | POA: Diagnosis present

## 2018-11-24 DIAGNOSIS — I214 Non-ST elevation (NSTEMI) myocardial infarction: Secondary | ICD-10-CM | POA: Diagnosis not present

## 2018-11-24 DIAGNOSIS — Z955 Presence of coronary angioplasty implant and graft: Secondary | ICD-10-CM

## 2018-11-24 DIAGNOSIS — I110 Hypertensive heart disease with heart failure: Secondary | ICD-10-CM | POA: Diagnosis present

## 2018-11-24 DIAGNOSIS — I959 Hypotension, unspecified: Secondary | ICD-10-CM | POA: Diagnosis not present

## 2018-11-24 DIAGNOSIS — E118 Type 2 diabetes mellitus with unspecified complications: Secondary | ICD-10-CM | POA: Diagnosis not present

## 2018-11-24 DIAGNOSIS — IMO0001 Reserved for inherently not codable concepts without codable children: Secondary | ICD-10-CM | POA: Diagnosis present

## 2018-11-24 DIAGNOSIS — R0602 Shortness of breath: Secondary | ICD-10-CM

## 2018-11-24 DIAGNOSIS — M1990 Unspecified osteoarthritis, unspecified site: Secondary | ICD-10-CM | POA: Diagnosis present

## 2018-11-24 DIAGNOSIS — E1165 Type 2 diabetes mellitus with hyperglycemia: Secondary | ICD-10-CM | POA: Diagnosis not present

## 2018-11-24 DIAGNOSIS — I2109 ST elevation (STEMI) myocardial infarction involving other coronary artery of anterior wall: Principal | ICD-10-CM | POA: Diagnosis present

## 2018-11-24 DIAGNOSIS — I249 Acute ischemic heart disease, unspecified: Secondary | ICD-10-CM

## 2018-11-24 DIAGNOSIS — Z6841 Body Mass Index (BMI) 40.0 and over, adult: Secondary | ICD-10-CM | POA: Diagnosis not present

## 2018-11-24 DIAGNOSIS — E782 Mixed hyperlipidemia: Secondary | ICD-10-CM | POA: Diagnosis present

## 2018-11-24 DIAGNOSIS — R05 Cough: Secondary | ICD-10-CM | POA: Diagnosis not present

## 2018-11-24 DIAGNOSIS — Z7984 Long term (current) use of oral hypoglycemic drugs: Secondary | ICD-10-CM | POA: Diagnosis not present

## 2018-11-24 DIAGNOSIS — E785 Hyperlipidemia, unspecified: Secondary | ICD-10-CM | POA: Diagnosis present

## 2018-11-24 DIAGNOSIS — R0600 Dyspnea, unspecified: Secondary | ICD-10-CM | POA: Diagnosis not present

## 2018-11-24 DIAGNOSIS — Z8249 Family history of ischemic heart disease and other diseases of the circulatory system: Secondary | ICD-10-CM

## 2018-11-24 DIAGNOSIS — I2510 Atherosclerotic heart disease of native coronary artery without angina pectoris: Secondary | ICD-10-CM | POA: Diagnosis not present

## 2018-11-24 DIAGNOSIS — R7989 Other specified abnormal findings of blood chemistry: Secondary | ICD-10-CM | POA: Diagnosis not present

## 2018-11-24 DIAGNOSIS — I252 Old myocardial infarction: Secondary | ICD-10-CM | POA: Diagnosis present

## 2018-11-24 DIAGNOSIS — Z86711 Personal history of pulmonary embolism: Secondary | ICD-10-CM | POA: Diagnosis not present

## 2018-11-24 DIAGNOSIS — K219 Gastro-esophageal reflux disease without esophagitis: Secondary | ICD-10-CM | POA: Diagnosis not present

## 2018-11-24 DIAGNOSIS — J810 Acute pulmonary edema: Secondary | ICD-10-CM | POA: Diagnosis not present

## 2018-11-24 DIAGNOSIS — J90 Pleural effusion, not elsewhere classified: Secondary | ICD-10-CM | POA: Diagnosis not present

## 2018-11-24 DIAGNOSIS — Z79899 Other long term (current) drug therapy: Secondary | ICD-10-CM

## 2018-11-24 HISTORY — PX: CORONARY STENT INTERVENTION: CATH118234

## 2018-11-24 HISTORY — PX: LEFT HEART CATH AND CORONARY ANGIOGRAPHY: CATH118249

## 2018-11-24 LAB — CBC
HCT: 50.3 % (ref 39.0–52.0)
Hemoglobin: 16.6 g/dL (ref 13.0–17.0)
MCH: 30.8 pg (ref 26.0–34.0)
MCHC: 33 g/dL (ref 30.0–36.0)
MCV: 93.3 fL (ref 80.0–100.0)
Platelets: 253 10*3/uL (ref 150–400)
RBC: 5.39 MIL/uL (ref 4.22–5.81)
RDW: 12.8 % (ref 11.5–15.5)
WBC: 15.3 10*3/uL — ABNORMAL HIGH (ref 4.0–10.5)
nRBC: 0 % (ref 0.0–0.2)

## 2018-11-24 LAB — CBC WITH DIFFERENTIAL/PLATELET
Abs Immature Granulocytes: 0.04 10*3/uL (ref 0.00–0.07)
Abs Immature Granulocytes: 0.04 10*3/uL (ref 0.00–0.07)
Basophils Absolute: 0.1 10*3/uL (ref 0.0–0.1)
Basophils Absolute: 0.1 10*3/uL (ref 0.0–0.1)
Basophils Relative: 1 %
Basophils Relative: 1 %
Eosinophils Absolute: 0.1 10*3/uL (ref 0.0–0.5)
Eosinophils Absolute: 0.1 10*3/uL (ref 0.0–0.5)
Eosinophils Relative: 1 %
Eosinophils Relative: 1 %
HCT: 47.8 % (ref 39.0–52.0)
HCT: 44.6 % (ref 39.0–52.0)
Hemoglobin: 15.9 g/dL (ref 13.0–17.0)
Hemoglobin: 14.7 g/dL (ref 13.0–17.0)
Immature Granulocytes: 0 %
Immature Granulocytes: 0 %
Lymphocytes Relative: 16 %
Lymphocytes Relative: 12 %
Lymphs Abs: 1.9 10*3/uL (ref 0.7–4.0)
Lymphs Abs: 1.6 10*3/uL (ref 0.7–4.0)
MCH: 30.8 pg (ref 26.0–34.0)
MCH: 30.5 pg (ref 26.0–34.0)
MCHC: 33.3 g/dL (ref 30.0–36.0)
MCHC: 33 g/dL (ref 30.0–36.0)
MCV: 92.6 fL (ref 80.0–100.0)
MCV: 92.5 fL (ref 80.0–100.0)
Monocytes Absolute: 0.9 10*3/uL (ref 0.1–1.0)
Monocytes Absolute: 1 10*3/uL (ref 0.1–1.0)
Monocytes Relative: 7 %
Monocytes Relative: 8 %
Neutro Abs: 8.7 10*3/uL — ABNORMAL HIGH (ref 1.7–7.7)
Neutro Abs: 10 10*3/uL — ABNORMAL HIGH (ref 1.7–7.7)
Neutrophils Relative %: 75 %
Neutrophils Relative %: 78 %
Platelets: 239 10*3/uL (ref 150–400)
Platelets: 218 10*3/uL (ref 150–400)
RBC: 5.16 MIL/uL (ref 4.22–5.81)
RBC: 4.82 MIL/uL (ref 4.22–5.81)
RDW: 12.5 % (ref 11.5–15.5)
RDW: 12.6 % (ref 11.5–15.5)
WBC: 11.6 10*3/uL — ABNORMAL HIGH (ref 4.0–10.5)
WBC: 12.7 10*3/uL — ABNORMAL HIGH (ref 4.0–10.5)
nRBC: 0 % (ref 0.0–0.2)
nRBC: 0 % (ref 0.0–0.2)

## 2018-11-24 LAB — RAPID URINE DRUG SCREEN, HOSP PERFORMED
Amphetamines: NOT DETECTED
Barbiturates: NOT DETECTED
Benzodiazepines: NOT DETECTED
Cocaine: POSITIVE — AB
Opiates: POSITIVE — AB
Tetrahydrocannabinol: NOT DETECTED

## 2018-11-24 LAB — COMPREHENSIVE METABOLIC PANEL
ALT: 22 U/L (ref 0–44)
ALT: 26 U/L (ref 0–44)
AST: 84 U/L — ABNORMAL HIGH (ref 15–41)
AST: 109 U/L — ABNORMAL HIGH (ref 15–41)
Albumin: 3.9 g/dL (ref 3.5–5.0)
Albumin: 3.6 g/dL (ref 3.5–5.0)
Alkaline Phosphatase: 65 U/L (ref 38–126)
Alkaline Phosphatase: 57 U/L (ref 38–126)
Anion gap: 12 (ref 5–15)
Anion gap: 11 (ref 5–15)
BUN: 13 mg/dL (ref 6–20)
BUN: 13 mg/dL (ref 6–20)
CO2: 22 mmol/L (ref 22–32)
CO2: 22 mmol/L (ref 22–32)
Calcium: 9.3 mg/dL (ref 8.9–10.3)
Calcium: 8.7 mg/dL — ABNORMAL LOW (ref 8.9–10.3)
Chloride: 108 mmol/L (ref 98–111)
Chloride: 109 mmol/L (ref 98–111)
Creatinine, Ser: 1.06 mg/dL (ref 0.61–1.24)
Creatinine, Ser: 0.92 mg/dL (ref 0.61–1.24)
GFR calc Af Amer: >60 mL/min (ref >60)
GFR calc Af Amer: >60 mL/min (ref >60)
GFR calc non Af Amer: >60 mL/min (ref >60)
GFR calc non Af Amer: >60 mL/min (ref >60)
Glucose, Bld: 143 mg/dL — ABNORMAL HIGH (ref 70–99)
Glucose, Bld: 116 mg/dL — ABNORMAL HIGH (ref 70–99)
Potassium: 3.6 mmol/L (ref 3.5–5.1)
Potassium: 3.3 mmol/L — ABNORMAL LOW (ref 3.5–5.1)
Sodium: 142 mmol/L (ref 135–145)
Sodium: 142 mmol/L (ref 135–145)
Total Bilirubin: 0.8 mg/dL (ref 0.3–1.2)
Total Bilirubin: 1 mg/dL (ref 0.3–1.2)
Total Protein: 7.2 g/dL (ref 6.5–8.1)
Total Protein: 6.4 g/dL — ABNORMAL LOW (ref 6.5–8.1)

## 2018-11-24 LAB — URIC ACID: Uric Acid, Serum: 6.1 mg/dL (ref 3.7–8.6)

## 2018-11-24 LAB — BRAIN NATRIURETIC PEPTIDE: B Natriuretic Peptide: 624.9 pg/mL — ABNORMAL HIGH (ref 0.0–100.0)

## 2018-11-24 LAB — ECHOCARDIOGRAM COMPLETE
Height: 76.5 in
Weight: 4499.2 oz

## 2018-11-24 LAB — CREATININE, SERUM
Creatinine, Ser: 1.07 mg/dL (ref 0.61–1.24)
GFR calc Af Amer: >60 mL/min (ref >60)
GFR calc non Af Amer: >60 mL/min (ref >60)

## 2018-11-24 LAB — POCT ACTIVATED CLOTTING TIME: Activated Clotting Time: 323 seconds

## 2018-11-24 LAB — TSH: TSH: 1.022 u[IU]/mL (ref 0.350–4.500)

## 2018-11-24 LAB — GLUCOSE, CAPILLARY
Glucose-Capillary: 139 mg/dL — ABNORMAL HIGH (ref 70–99)
Glucose-Capillary: 99 mg/dL (ref 70–99)
Glucose-Capillary: 133 mg/dL — ABNORMAL HIGH (ref 70–99)

## 2018-11-24 LAB — PROTIME-INR
INR: 1.1 (ref 0.8–1.2)
Prothrombin Time: 14.2 seconds (ref 11.4–15.2)

## 2018-11-24 LAB — TROPONIN I (HIGH SENSITIVITY)
Troponin I (High Sensitivity): 10724 ng/L (ref <18)
Troponin I (High Sensitivity): 17810 ng/L (ref <18)

## 2018-11-24 LAB — LIPID PANEL
Cholesterol: 181 mg/dL (ref 0–200)
HDL: 28 mg/dL — ABNORMAL LOW (ref >40)
LDL Cholesterol: 113 mg/dL — ABNORMAL HIGH (ref 0–99)
Total CHOL/HDL Ratio: 6.5 RATIO
Triglycerides: 199 mg/dL — ABNORMAL HIGH (ref <150)
VLDL: 40 mg/dL (ref 0–40)

## 2018-11-24 LAB — ETHANOL: Alcohol, Ethyl (B): <10 mg/dL (ref <10)

## 2018-11-24 LAB — SARS CORONAVIRUS 2 BY RT PCR (HOSPITAL ORDER, PERFORMED IN ~~LOC~~ HOSPITAL LAB): SARS Coronavirus 2: NEGATIVE

## 2018-11-24 LAB — MAGNESIUM: Magnesium: 1.9 mg/dL (ref 1.7–2.4)

## 2018-11-24 LAB — HEPARIN LEVEL (UNFRACTIONATED): Heparin Unfractionated: <0.1 IU/mL — ABNORMAL LOW (ref 0.30–0.70)

## 2018-11-24 SURGERY — LEFT HEART CATH AND CORONARY ANGIOGRAPHY
Anesthesia: LOCAL

## 2018-11-24 MED ORDER — ALLOPURINOL 300 MG PO TABS
300.0000 mg | ORAL_TABLET | Freq: Every day | ORAL | Status: DC
  Administered 2018-11-25 – 2018-11-26 (×2): 300 mg via ORAL
  Filled 2018-11-24 (×2): qty 1

## 2018-11-24 MED ORDER — ONDANSETRON HCL 4 MG/2ML IJ SOLN: 4.0000 mg | Freq: Four times a day (QID) | INTRAMUSCULAR | Status: DC | PRN

## 2018-11-24 MED ORDER — INSULIN ASPART 100 UNIT/ML ~~LOC~~ SOLN
0.0000 [IU] | SUBCUTANEOUS | Status: DC
  Administered 2018-11-24 – 2018-11-25 (×2): 1 [IU] via SUBCUTANEOUS

## 2018-11-24 MED ORDER — TICAGRELOR 90 MG PO TABS
ORAL_TABLET | ORAL | Status: DC | PRN
  Administered 2018-11-24: 180 mg via ORAL

## 2018-11-24 MED ORDER — SODIUM CHLORIDE 0.9% FLUSH: 3.0000 mL | INTRAVENOUS | Status: DC | PRN

## 2018-11-24 MED ORDER — FUROSEMIDE 10 MG/ML IJ SOLN
INTRAMUSCULAR | Status: DC | PRN
  Administered 2018-11-24: 40 mg via INTRAVENOUS

## 2018-11-24 MED ORDER — THIAMINE HCL 100 MG/ML IJ SOLN
100.0000 mg | Freq: Every day | INTRAMUSCULAR | Status: DC
  Administered 2018-11-24: 100 mg via INTRAVENOUS
  Filled 2018-11-24: qty 2

## 2018-11-24 MED ORDER — NITROGLYCERIN 0.4 MG SL SUBL
0.4000 mg | SUBLINGUAL_TABLET | SUBLINGUAL | Status: DC | PRN
  Administered 2018-11-24 (×3): 0.4 mg via SUBLINGUAL

## 2018-11-24 MED ORDER — FOLIC ACID 1 MG PO TABS
1.0000 mg | ORAL_TABLET | Freq: Every day | ORAL | Status: DC
  Administered 2018-11-25 – 2018-11-28 (×4): 1 mg via ORAL
  Filled 2018-11-24 (×4): qty 1

## 2018-11-24 MED ORDER — TIROFIBAN HCL IN NACL 5-0.9 MG/100ML-% IV SOLN
0.1500 ug/kg/min | INTRAVENOUS | Status: AC
  Administered 2018-11-24 – 2018-11-25 (×3): 0.15 ug/kg/min via INTRAVENOUS
  Filled 2018-11-24 (×5): qty 100

## 2018-11-24 MED ORDER — ACETAMINOPHEN 325 MG PO TABS
650.0000 mg | ORAL_TABLET | ORAL | Status: DC | PRN
  Administered 2018-11-24 – 2018-11-28 (×6): 650 mg via ORAL
  Filled 2018-11-24 (×6): qty 2

## 2018-11-24 MED ORDER — ASPIRIN 81 MG PO CHEW
324.0000 mg | CHEWABLE_TABLET | ORAL | Status: AC
  Administered 2018-11-24: 324 mg via ORAL
  Filled 2018-11-24: qty 4

## 2018-11-24 MED ORDER — SODIUM CHLORIDE 0.9% FLUSH
3.0000 mL | Freq: Two times a day (BID) | INTRAVENOUS | Status: DC
  Administered 2018-11-24 (×2): 3 mL via INTRAVENOUS

## 2018-11-24 MED ORDER — SODIUM CHLORIDE 0.9 % IV SOLN
INTRAVENOUS | Status: DC
  Administered 2018-11-24: 09:00:00 via INTRAVENOUS

## 2018-11-24 MED ORDER — FENTANYL CITRATE (PF) 100 MCG/2ML IJ SOLN
INTRAMUSCULAR | Status: DC | PRN
  Administered 2018-11-24: 25 ug via INTRAVENOUS

## 2018-11-24 MED ORDER — HEPARIN (PORCINE) 25000 UT/250ML-% IV SOLN
1400.0000 [IU]/h | INTRAVENOUS | Status: DC
  Administered 2018-11-24: 1000 [IU]/h via INTRAVENOUS

## 2018-11-24 MED ORDER — FUROSEMIDE 10 MG/ML IJ SOLN
INTRAMUSCULAR | Status: AC
  Filled 2018-11-24: qty 4

## 2018-11-24 MED ORDER — INSULIN DETEMIR 100 UNIT/ML FLEXPEN: 30.0000 [IU] | PEN_INJECTOR | Freq: Every day | SUBCUTANEOUS | Status: DC

## 2018-11-24 MED ORDER — HEPARIN (PORCINE) IN NACL 1000-0.9 UT/500ML-% IV SOLN
INTRAVENOUS | Status: AC
  Filled 2018-11-24: qty 1000

## 2018-11-24 MED ORDER — SODIUM CHLORIDE 0.9% FLUSH: 10.0000 mL | INTRAVENOUS | Status: DC | PRN

## 2018-11-24 MED ORDER — VERAPAMIL HCL 2.5 MG/ML IV SOLN
INTRAVENOUS | Status: DC | PRN
  Administered 2018-11-24: 10 mL via INTRA_ARTERIAL

## 2018-11-24 MED ORDER — HEPARIN BOLUS VIA INFUSION
3500.0000 [IU] | Freq: Once | INTRAVENOUS | Status: DC
  Filled 2018-11-24: qty 3500

## 2018-11-24 MED ORDER — SODIUM CHLORIDE 0.9 % IV SOLN: 250.0000 mL | INTRAVENOUS | Status: DC | PRN

## 2018-11-24 MED ORDER — HEPARIN SODIUM (PORCINE) 1000 UNIT/ML IJ SOLN
INTRAMUSCULAR | Status: AC
  Filled 2018-11-24: qty 1

## 2018-11-24 MED ORDER — HEPARIN SODIUM (PORCINE) 1000 UNIT/ML IJ SOLN
INTRAMUSCULAR | Status: DC | PRN
  Administered 2018-11-24 (×2): 6000 [IU] via INTRAVENOUS

## 2018-11-24 MED ORDER — TIROFIBAN HCL IN NACL 5-0.9 MG/100ML-% IV SOLN
INTRAVENOUS | Status: AC | PRN
  Administered 2018-11-24: 0.075 ug/kg/min via INTRAVENOUS

## 2018-11-24 MED ORDER — VITAMIN B-1 100 MG PO TABS
100.0000 mg | ORAL_TABLET | Freq: Every day | ORAL | Status: DC
  Administered 2018-11-25 – 2018-11-28 (×4): 100 mg via ORAL
  Filled 2018-11-24 (×4): qty 1

## 2018-11-24 MED ORDER — TICAGRELOR 90 MG PO TABS
ORAL_TABLET | ORAL | Status: AC
  Filled 2018-11-24: qty 1

## 2018-11-24 MED ORDER — ENOXAPARIN SODIUM 40 MG/0.4ML ~~LOC~~ SOLN
40.0000 mg | SUBCUTANEOUS | Status: DC
  Administered 2018-11-25 – 2018-11-26 (×2): 40 mg via SUBCUTANEOUS
  Filled 2018-11-24 (×2): qty 0.4

## 2018-11-24 MED ORDER — ASPIRIN 300 MG RE SUPP: 300.0000 mg | RECTAL | Status: AC

## 2018-11-24 MED ORDER — CARVEDILOL 3.125 MG PO TABS
3.1250 mg | ORAL_TABLET | Freq: Two times a day (BID) | ORAL | Status: DC
  Administered 2018-11-24: 3.125 mg via ORAL
  Filled 2018-11-24: qty 1

## 2018-11-24 MED ORDER — MORPHINE SULFATE (PF) 2 MG/ML IV SOLN
1.0000 mg | INTRAVENOUS | Status: DC | PRN
  Administered 2018-11-24: 2 mg via INTRAVENOUS
  Filled 2018-11-24: qty 1

## 2018-11-24 MED ORDER — TICAGRELOR 90 MG PO TABS
90.0000 mg | ORAL_TABLET | Freq: Two times a day (BID) | ORAL | Status: DC
  Administered 2018-11-25 – 2018-11-28 (×8): 90 mg via ORAL
  Filled 2018-11-24 (×8): qty 1

## 2018-11-24 MED ORDER — SODIUM CHLORIDE 0.9 % WEIGHT BASED INFUSION: 1.0000 mL/kg/h | INTRAVENOUS | Status: DC

## 2018-11-24 MED ORDER — VERAPAMIL HCL 2.5 MG/ML IV SOLN
INTRAVENOUS | Status: AC
  Filled 2018-11-24: qty 2

## 2018-11-24 MED ORDER — FUROSEMIDE 40 MG PO TABS
40.0000 mg | ORAL_TABLET | Freq: Two times a day (BID) | ORAL | Status: DC
  Administered 2018-11-25 (×2): 40 mg via ORAL
  Filled 2018-11-24 (×2): qty 1

## 2018-11-24 MED ORDER — FENTANYL CITRATE (PF) 100 MCG/2ML IJ SOLN
INTRAMUSCULAR | Status: AC
  Filled 2018-11-24: qty 2

## 2018-11-24 MED ORDER — INSULIN DETEMIR 100 UNIT/ML ~~LOC~~ SOLN
30.0000 [IU] | Freq: Every day | SUBCUTANEOUS | Status: DC
  Administered 2018-11-24 – 2018-11-27 (×4): 30 [IU] via SUBCUTANEOUS
  Filled 2018-11-24 (×6): qty 0.3

## 2018-11-24 MED ORDER — LORAZEPAM 2 MG/ML IJ SOLN
2.0000 mg | INTRAMUSCULAR | Status: DC | PRN
  Administered 2018-11-24: 2 mg via INTRAVENOUS
  Filled 2018-11-24: qty 1

## 2018-11-24 MED ORDER — LIDOCAINE HCL (PF) 1 % IJ SOLN
INTRAMUSCULAR | Status: AC
  Filled 2018-11-24: qty 30

## 2018-11-24 MED ORDER — ATORVASTATIN CALCIUM 80 MG PO TABS
80.0000 mg | ORAL_TABLET | Freq: Every day | ORAL | Status: DC
  Administered 2018-11-24 – 2018-11-27 (×4): 80 mg via ORAL
  Filled 2018-11-24 (×4): qty 1

## 2018-11-24 MED ORDER — MAGNESIUM SULFATE 2 GM/50ML IV SOLN
2.0000 g | Freq: Once | INTRAVENOUS | Status: AC
  Administered 2018-11-24: 2 g via INTRAVENOUS
  Filled 2018-11-24: qty 50

## 2018-11-24 MED ORDER — MIDAZOLAM HCL 2 MG/2ML IJ SOLN
INTRAMUSCULAR | Status: AC
  Filled 2018-11-24: qty 2

## 2018-11-24 MED ORDER — FOLIC ACID 5 MG/ML IJ SOLN
1.0000 mg | Freq: Every day | INTRAMUSCULAR | Status: DC
  Administered 2018-11-24: 1 mg via INTRAVENOUS
  Filled 2018-11-24: qty 0.2

## 2018-11-24 MED ORDER — ZOLPIDEM TARTRATE 5 MG PO TABS
5.0000 mg | ORAL_TABLET | Freq: Every evening | ORAL | Status: DC | PRN
  Administered 2018-11-27: 5 mg via ORAL
  Filled 2018-11-24: qty 1

## 2018-11-24 MED ORDER — SODIUM CHLORIDE 0.9% FLUSH
3.0000 mL | Freq: Two times a day (BID) | INTRAVENOUS | Status: DC
  Administered 2018-11-24: 3 mL via INTRAVENOUS

## 2018-11-24 MED ORDER — ASPIRIN EC 81 MG PO TBEC
81.0000 mg | DELAYED_RELEASE_TABLET | Freq: Every day | ORAL | Status: DC
  Administered 2018-11-25 – 2018-11-28 (×4): 81 mg via ORAL
  Filled 2018-11-24 (×4): qty 1

## 2018-11-24 MED ORDER — FAMOTIDINE 20 MG PO TABS
20.0000 mg | ORAL_TABLET | Freq: Two times a day (BID) | ORAL | Status: DC
  Administered 2018-11-24 – 2018-11-28 (×9): 20 mg via ORAL
  Filled 2018-11-24 (×9): qty 1

## 2018-11-24 MED ORDER — SODIUM CHLORIDE 0.9 % WEIGHT BASED INFUSION: 3.0000 mL/kg/h | INTRAVENOUS | Status: DC

## 2018-11-24 MED ORDER — TIROFIBAN HCL IN NACL 5-0.9 MG/100ML-% IV SOLN
INTRAVENOUS | Status: AC
  Filled 2018-11-24: qty 100

## 2018-11-24 MED ORDER — POTASSIUM CHLORIDE CRYS ER 20 MEQ PO TBCR
40.0000 meq | EXTENDED_RELEASE_TABLET | Freq: Once | ORAL | Status: AC
  Administered 2018-11-24: 40 meq via ORAL
  Filled 2018-11-24: qty 2

## 2018-11-24 MED ORDER — HEPARIN (PORCINE) IN NACL 1000-0.9 UT/500ML-% IV SOLN
INTRAVENOUS | Status: DC | PRN
  Administered 2018-11-24 (×2): 500 mL

## 2018-11-24 MED ORDER — TIROFIBAN (AGGRASTAT) BOLUS VIA INFUSION
INTRAVENOUS | Status: DC | PRN
  Administered 2018-11-24: 3190 ug via INTRAVENOUS

## 2018-11-24 MED ORDER — MIDAZOLAM HCL 2 MG/2ML IJ SOLN
INTRAMUSCULAR | Status: DC | PRN
  Administered 2018-11-24: 1 mg via INTRAVENOUS

## 2018-11-24 SURGICAL SUPPLY — 21 items
BALLN SAPPHIRE 2.5X15 (BALLOONS) ×2
BALLN SAPPHIRE 2.5X20 (BALLOONS) ×2
BALLN SAPPHIRE ~~LOC~~ 3.0X12 (BALLOONS) ×1 IMPLANT
BALLN SAPPHIRE ~~LOC~~ 3.75X12 (BALLOONS) ×1 IMPLANT
BALLOON SAPPHIRE 2.5X15 (BALLOONS) IMPLANT
BALLOON SAPPHIRE 2.5X20 (BALLOONS) IMPLANT
CATH INFINITI 5FR JK (CATHETERS) ×1 IMPLANT
CATH LAUNCHER 6FR EBU3.5 (CATHETERS) ×1 IMPLANT
COVER DOME SNAP 22 D (MISCELLANEOUS) ×1 IMPLANT
DEVICE RAD COMP TR BAND LRG (VASCULAR PRODUCTS) ×1 IMPLANT
GLIDESHEATH SLEND SS 6F .021 (SHEATH) ×1 IMPLANT
GUIDEWIRE INQWIRE 1.5J.035X260 (WIRE) IMPLANT
INQWIRE 1.5J .035X260CM (WIRE) ×2
KIT ENCORE 26 ADVANTAGE (KITS) ×1 IMPLANT
KIT HEART LEFT (KITS) ×2 IMPLANT
PACK CARDIAC CATHETERIZATION (CUSTOM PROCEDURE TRAY) ×2 IMPLANT
STENT RESOLUTE ONYX 2.75X18 (Permanent Stent) ×1 IMPLANT
STENT RESOLUTE ONYX 3.5X22 (Permanent Stent) ×1 IMPLANT
TRANSDUCER W/STOPCOCK (MISCELLANEOUS) ×2 IMPLANT
TUBING CIL FLEX 10 FLL-RA (TUBING) ×2 IMPLANT
WIRE RUNTHROUGH .014X180CM (WIRE) ×1 IMPLANT

## 2018-11-24 NOTE — Interval H&P Note (Signed)
Cath Lab Visit (complete for each Cath Lab visit)  Clinical Evaluation Leading to the Procedure:   ACS: Yes.    NSTEMI  Non-ACS:     History and Physical Interval Note:  11/24/2018 12:54 PM  Evan [name]  has presented today for surgery, with the diagnosis of nonstemi.  The various methods of treatment have been discussed with the patient and family. After consideration of risks, benefits and other options for treatment, the patient has consented to  Procedure(s): LEFT HEART CATH AND CORONARY ANGIOGRAPHY (N/A) as a surgical intervention.  The patient's history has been reviewed, patient examined, no change in status, stable for surgery.  I have reviewed the patient's chart and labs.  Questions were answered to the patient's satisfaction.     Kathlyn [place]

## 2018-11-24 NOTE — H&P (Signed)
[facility] History and Physical  Evan [name] YIR:[id number] DOB: 02-28-1966 DOA: 11/24/2018  Referring physician:  PCP: Dettinger, Evan Kaufmann, MD   Chief Complaint: Chest pain with exertion, relieved with rest or NTG  HPI: Evan [name] is a 53 y.o. WM PMHx diabetes type 2 uncontrolled with complication, HTN, HLD, gout, morbid obesity.  States over the last week has had multiple episodes of chest pain centered substernal/left chest with radiation down left arm and into left neck most episodes have been secondary to exertion and relieved with rest.  Positive S OB, negative diaphoresis, positive nausea, negative vomiting.  Currently negative chest pain after NTG and morphine.    Review of Systems:  Constitutional:  No weight loss, night sweats, Fevers, chills, fatigue.  HEENT:  No headaches, Difficulty swallowing,Tooth/dental problems,Sore throat,  No sneezing, itching, ear ache, nasal congestion, post nasal drip,  Cardio-vascular:  Positive chest pain, Orthopnea, PND, swelling in lower extremities, anasarca, dizziness, Positive palpitations entheses 1 episode) GI:  No heartburn,Positive indigestion, abdominal pain,Positive nausea, vomiting, diarrhea, change in bowel habits, loss of appetite  Resp:  Positive shortness of breath with exertion or at rest. No excess mucus, no productive cough, No non-productive cough, No coughing up of blood.No change in color of mucus.No wheezing.No chest wall deformity  Skin:  no rash or lesions.  GU:  no dysuria, change in color of urine, no urgency or frequency. No flank pain.  Musculoskeletal:  No joint pain or swelling. No decreased range of motion. No back pain.  Psych:  No change in mood or affect. No depression or anxiety. No memory loss.   Past Medical History:  Diagnosis Date  . Arthritis   . Diabetes mellitus without complication ([place])   . Gout   . Hyperlipidemia   . Hypertension   . Morbid obesity (Evan [name]) 12/22/2016   Past  Surgical History:  Procedure Laterality Date  . ANKLE ARTHROSCOPY Right   . CYST REMOVAL HAND    . KNEE ARTHROSCOPY Left    Social History:  reports that he has never smoked. He has never used smokeless tobacco. He reports current alcohol use of about 12.0 standard drinks of alcohol per week. He reports that he does not use drugs.  No Known Allergies  Family History  Problem Relation Age of Onset  . Diabetes Mother   . Stroke Mother   . Early death Father   . Heart attack Father   . Alcohol abuse Father   . Heart disease Father   . Hypertension Brother   . Early death Paternal Grandfather   . Heart attack Paternal Grandfather   . Colon cancer Neg Hx   . Esophageal cancer Neg Hx   . Rectal cancer Neg Hx   . Stomach cancer Neg Hx   Father died age 34 from MI, grandfather died age 76 MI,   Prior to Admission medications   Medication Sig Start Date End Date Taking? Authorizing Provider  famotidine (PEPCID) 20 MG tablet Take 1 tablet (20 mg total) by mouth 2 (two) times daily. 09/30/18  Yes Dettinger, Evan Kaufmann, MD  ranitidine (ZANTAC) 150 MG tablet Take 1 tablet (150 mg total) by mouth 2 (two) times daily. 07/09/18  Yes Dettinger, Evan Kaufmann, MD  ACCU-CHEK AVIVA PLUS test strip USE TO CHECK SUGAR TWICE DAILY 08/09/18   Dettinger, Evan Kaufmann, MD  allopurinol (ZYLOPRIM) 300 MG tablet Take 1 tablet (300 mg total) daily by mouth. 04/06/17   Dettinger, Evan Kaufmann, MD  BD PEN  NEEDLE NANO U/F 32G X 4 MM MISC USE DAILY WITH INSULIN 10/08/18   Dettinger, Evan Kaufmann, MD  benazepril (LOTENSIN) 20 MG tablet Take 1 tablet (20 mg total) by mouth daily. 04/07/18   Dettinger, Evan Kaufmann, MD  fenofibrate (TRICOR) 145 MG tablet TAKE 1 TABLET BY MOUTH EVERY DAY 05/14/18   Dettinger, Evan Kaufmann, MD  HYDROcodone-acetaminophen (Gray) 7.5-325 MG tablet Take 1 tablet by mouth every 8 (eight) hours as needed for moderate pain or severe pain. 10/08/18   Dettinger, Evan Kaufmann, MD  Insulin Detemir (LEVEMIR FLEXTOUCH) 100 UNIT/ML  Pen INJECT 30 UNITS DAILY AT BEDTIME - UP TO 50 UNITS AS NEEDED 10/08/18   Dettinger, Evan Kaufmann, MD  JARDIANCE 25 MG TABS tablet Take 25 mg by mouth daily. 04/07/18   Dettinger, Evan Kaufmann, MD  Omega-3 Fatty Acids (FISH OIL) 1000 MG CAPS Take 2,000 mg by mouth daily.    [provider]  sildenafil (REVATIO) 20 MG tablet Take 1 tablet (20 mg total) by mouth as needed. 04/07/18   Dettinger, Evan Kaufmann, MD     Consultants:  Cardiology    Procedures/Significant Events:  7/8 EKG; see results below 7/8 echocardiogram:Left Ventricle: LVEF =25-30%.-moderate to severely dilated. -Akinesis of the entire apical myocardium, inferoseptal, anteroseptal and anterior walls. Left Atrium:  moderately dilated. Interatrial Septum: Increased thickness of the atrial septum sparing the fossa ovalis consistent with The interatrial septum appears to be lipomatous.     I have personally reviewed and interpreted all radiology studies and my findings are as above.   VENTILATOR SETTINGS:    Cultures 7/8 SARS coronavirus negative   Antimicrobials:    Devices    LINES / TUBES:      Continuous Infusions:  Physical Exam: Vitals:   11/24/18 0514 11/24/18 0753 11/24/18 0755  BP: (!) 133/96  (!) 126/102  Pulse: (!) 101  (!) 106  Resp: 17    Temp: 98.1 F (36.7 C)    TempSrc: Oral    SpO2: 98% 96% 95%  Weight: 127.6 kg    Height: 6' 4.5" (1.943 m)      Wt Readings from Last 3 Encounters:  11/24/18 127.6 kg  10/08/18 128.3 kg  07/09/18 126.8 kg    General: A/O x4 no acute respiratory distress Eyes: negative scleral hemorrhage, negative anisocoria, negative icterus ENT: Negative Runny nose, negative gingival bleeding, Neck:  Negative scars, masses, torticollis, lymphadenopathy, JVD Lungs: Clear to auscultation bilaterally without wheezes or crackles Cardiovascular: Regular rate and rhythm without murmur gallop or rub normal S1 and S2 Abdomen: negative abdominal pain,  nondistended, positive soft, bowel sounds, no rebound, no ascites, no appreciable mass Extremities: No significant cyanosis, clubbing, or edema bilateral lower extremities Skin: Negative rashes, lesions, ulcers Psychiatric:  Negative depression, negative anxiety, negative fatigue, negative mania  Central nervous system:  Cranial nerves II through XII intact, tongue/uvula midline, all extremities muscle strength 5/5, sensation intact throughout,  negative dysarthria, negative expressive aphasia, negative receptive aphasia.        Labs on Admission:  Basic Metabolic Panel: Recent Labs  Lab 11/24/18 0613  NA 142  K 3.6  CL 108  CO2 22  GLUCOSE 143*  BUN 13  CREATININE 1.06  CALCIUM 9.3   Liver Function Tests: Recent Labs  Lab 11/24/18 0613  AST 84*  ALT 22  ALKPHOS 65  BILITOT 0.8  PROT 7.2  ALBUMIN 3.9   No results for input(s): LIPASE, AMYLASE in the last 168 hours. No results for input(s): AMMONIA  in the last 168 hours. CBC: Recent Labs  Lab 11/24/18 0613  WBC 11.6*  NEUTROABS 8.7*  HGB 15.9  HCT 47.8  MCV 92.6  PLT 239   Cardiac Enzymes: No results for input(s): CKTOTAL, CKMB, CKMBINDEX, TROPONINI in the last 168 hours.  BNP (last 3 results) No results for input(s): BNP in the last 8760 hours.  ProBNP (last 3 results) No results for input(s): PROBNP in the last 8760 hours.  CBG: Recent Labs  Lab 11/24/18 0526  GLUCAP 139*    Radiological Exams on Admission: Portable Chest X-ray 1 View  Result Date: 11/24/2018 CLINICAL DATA:  Acute coronary syndrome. EXAM: PORTABLE CHEST 1 VIEW COMPARISON:  Radiograph of November 24, 2018. FINDINGS: The heart size and mediastinal contours are within normal limits. Both lungs are clear. No pneumothorax or pleural effusion is noted. The visualized skeletal structures are unremarkable. IMPRESSION: No active disease. Electronically Signed   By: Marijo Conception M.D.   On: 11/24/2018 09:14     EKG: Independently reviewed.  7/8  EKG anterior lateral infarct age indeterminate, possible left atrial enlargement    Assessment/Plan Principal Problem:   NSTEMI (non-ST elevated myocardial infarction) ([place]) Active Problems:   Hypertension associated with diabetes ([place])   Hyperlipidemia associated with type 2 diabetes mellitus (H[facility])   ACS (acute coronary syndrome) ([place])   Diabetes mellitus type 2, uncontrolled, with complications ([facility])   Obesity, Class III, BMI 40-49.9 (morbid obesity) ([place])  NSTEMI - Patient with positive troponin, EKG suggestive of anterior lateral MI indeterminate age but given patient's symptomatology high probability occurred over this last week. - Echocardiogram pending - Cardiology consulted.  Counseled patient possible cardiac catheterization pending will await cardiology recommendations.  HTN - Currently controlled without medication patient will need beta-blocker eventually. -Given patient's cocaine use and new NSTEMI would hold off on starting patient on beta-blocker for couple of days however will defer to cardiology as to the exact timing.  EtOH abuse/substance abuse - On CIWA protocol -EtOH level negative - Rapid urine tox screen positive opiates and cocaine  Diabetes type 2 uncontrolled with complication - Unsure of patient's home medication regimen - Sensitive SSI -Hemoglobin A1c pending -Lipid panel pending  Obesity  Miscellaneous - COVID negative    Code Status: Full (DVT Prophylaxis: Heparin drip Family Communication: None Disposition Plan: Per cardiology    Data Reviewed: Care during the described time interval was provided by me .  I have reviewed this patient's available data, including medical history, events of note, physical examination, and all test results as part of my evaluation.   The patient is critically ill with multiple organ systems failure and requires high complexity decision making for assessment and support, frequent evaluation and titration of  therapies, application of advanced monitoring technologies and extensive interpretation of multiple databases. Critical Care Time devoted to patient care services described in this note  Time spent: 91 minutes   Lexianna Weinrich, [place] [facility] Pager [phone]

## 2018-11-24 NOTE — Significant Event (Addendum)
Rapid Response Event Note  Overview: Cardiac - Chest Pain   Initial Focused Assessment: While rounding, informed by charge RN that patient is having current - ongoing CP. Patient was admitted overnight, was given NTG SL x 2 and just received Morphine 2 mg IV at 0807. When I arrived, patient stated CP was 4/10, mid sternal - left sided, currently not radiating but was radiating down into his LT arm. Skin warm and dry. HR 100s, appears ST, stable BP. Mild shortness of breath, currently sitting up on the side of the bed, oxygen saturations 100% on RA. Lung sounds - clear, good air movement.  Neuro intact. TRH MD aware and CARDS has been consulted. After 5 minutes, patient stated that the chest pain had eased off. On a heparin drip as well. ASA 324 given. ECHO tech at bedside. Endorse acid-reflux like symptoms as well.   Interventions: -- EKG was done - T wave changes noted.  -- Troponin -- 10,724  (so far the only one done).   Plan of Care: -- CARDS to see patient.  -- Monitor VS and treat CP as needed.  -- Pending ECHO  Event Summary:  Start Time 0815 End Time 0830  Tracen Mahler R

## 2018-11-24 NOTE — Progress Notes (Signed)
On call MD as well as admissions notified of pt arriving to 6e05 via carelink. Pt is pain free. VS stable & charted. IV hep currently infusing. Pt oriented to room & staff. Pt settled in. Will continue to monitor the pt.Hoover Brunette, RN

## 2018-11-24 NOTE — Progress Notes (Signed)
ANTICOAGULATION CONSULT NOTE - Initial Consult  Pharmacy Consult for Heparin  Indication: chest pain/ACS  No Known Allergies  Patient Measurements: Height: 6' 4.5" (194.3 cm) Weight: 281 lb 3.2 oz (127.6 kg) IBW/kg (Calculated) : 87.95  Vital Signs: Temp: 98.1 F (36.7 C) (07/08 0514) Temp Source: Oral (07/08 0514) BP: 133/96 (07/08 0514) Pulse Rate: 101 (07/08 0514)  Labs: Recent Labs    11/24/18 0613  HGB 15.9  HCT 47.8  PLT 239  LABPROT 14.2  INR 1.1    CrCl cannot be calculated (Patient's most recent lab result is older than the maximum 21 days allowed.).   Medical History: Past Medical History:  Diagnosis Date  . Arthritis   . Diabetes mellitus without complication ([place])   . Gout   . Hyperlipidemia   . Hypertension   . Morbid obesity ([place]) 12/22/2016   Assessment: 53 y/o M transfer from outside hospital with heparin running at 1000 units/hr (heparin level likely to be low with lower dose). He did receive at 5000 unit BOLUS at ~0115.   Goal of Therapy:  Heparin level 0.3-0.7 units/ml Monitor platelets by anticoagulation protocol: Yes   Plan:  -Continue heparin at 1000 units/hr for now -Check stat heparin level and adjust dose as needed  Narda Bonds, PharmD, [place] Pharmac[facility] Phone: [phone]

## 2018-11-24 NOTE — H&P (View-Only) (Signed)
Cardiology Consultation:   Patient ID: Evan [name] MRN: [id number]; DOB: 20-Dec-1965  Admit date: 11/24/2018 Date of Consult: 11/24/2018  Primary Care Provider: Dettinger, Fransisca Kaufmann, MD Primary Cardiologist: New to [place] (Dr. Gwenlyn Found) Primary Electrophysiologist:  None    Patient Profile:   Evan [name] is a 53 y.o. male with a history of hypertension, hyperlipidemia, diabetes mellitus, and obesity who is being seen today for the evaluation of chest pain at the request of Dr. Sherral Hammers.   History of Present Illness:   Mr. Monday is a 53 year old male with the above history. Patient does not see a Cardiologist. He states he had a stress test 20 years ago after his father died from a heart attack which was reportedly normal.  Patient presented to the [facility] ED on 11/23/2018 for intermittent chest pain over the past 1-2 weeks that has worsened over the last couple of days. Patient reports pressure across his chest mostly with exertion, specifically when walking up hill. Some radiation to left arm at times. He reports associated shortness of breath but denies any nausea or vomiting. He initially thought the pain was his reflux. He also reports recent orthopnea and PND but has not noticed any lower extremity edema. He has had difficulty sleeping the past few days due to difficulty breathing. He also reports some palpitations and some lightheadedness but denies any syncope. No recent fevers, chills, or illnesses.  In the [facility] ED: EKG showed sinus tachycardia, rate 115 bpm, with anteroseptal Q waves and T wave inversion in aVL. Troponin I minimally elevated at 0.23 >> 0.46. Chest x-ray showed mild cardiomegaly with scattered airspace opacities bilaterally and promientn interstitial lung markings which can be seen with developing pulmonary edema or an atypical infectious process. NT-Pro BNP elevated at 1,708. D-dimer elevated at 0.71. Chest CTA showed no evidence of pulmonary embolus  but did note small bilateral pleural effusion and minimal patchy ground-glass opacities felt to be more consistent with pulmonary edema than multifocal infection. WBC 9.3, Hgb 16.0, Plts 248. Na 141, K 3.4, Glucose 151, Scr 0.92.  Patient was transferred to [facility] for further evaluation.  At the time of this evaluation, patient reports left sided chest pressure that has improved some with Nitro. Repeat EKG shows sinus tachycardia with T wave inversion in leas I and aVL, anterior Q waves, and changing T wave in anterior leads concerning for anterior MI. High-sensitivity troponin here elevated at 10,724 >> 17,810.   Patient denies any tobacco use but reports alcohol use about 2 times per week. Also reports occasional cocaine use and states he last used a couple of weeks ago. Urine drug screen pending. Patient has family history significant for CAD with both father and paternal grandfather dying from a heart attack in their mid 40's.  Heart Pathway Score:     Past Medical History:  Diagnosis Date  . Arthritis   . Diabetes mellitus without complication ([place])   . Gout   . Hyperlipidemia   . Hypertension   . Morbid obesity ([place]) 12/22/2016    Past Surgical History:  Procedure Laterality Date  . ANKLE ARTHROSCOPY Right   . CYST REMOVAL HAND    . KNEE ARTHROSCOPY Left      Inpatient Medications: Scheduled Meds: . [START ON 11/25/2018] aspirin EC  81 mg Oral Daily  . folic acid  1 mg Intravenous Daily  . insulin aspart  0-9 Units Subcutaneous Q4H  . thiamine injection  100 mg Intravenous  Daily   Continuous Infusions: . sodium chloride 75 mL/hr at 11/24/18 0858  . heparin 1,000 Units/hr (11/24/18 0857)   PRN Meds: acetaminophen, LORazepam, morphine injection, nitroGLYCERIN, ondansetron (ZOFRAN) IV, sodium chloride flush, zolpidem  Allergies:   No Known Allergies  Social History:   Social History   Socioeconomic History  . Marital status: Single    Spouse name: Not on file  .  Number of children: Not on file  . Years of education: Not on file  . Highest education level: Not on file  Occupational History  . Not on file  Social Needs  . Financial resource strain: Not on file  . Food insecurity    Worry: Not on file    Inability: Not on file  . Transportation needs    Medical: Not on file    Non-medical: Not on file  Tobacco Use  . Smoking status: Never Smoker  . Smokeless tobacco: Never Used  Substance and Sexual Activity  . Alcohol use: Yes    Alcohol/week: 12.0 standard drinks    Types: 12 Cans of beer per week  . Drug use: No  . Sexual activity: Yes    Comment: male 8 months partner  Lifestyle  . Physical activity    Days per week: Not on file    Minutes per session: Not on file  . Stress: Not on file  Relationships  . Social Herbalist on phone: Not on file    Gets together: Not on file    Attends religious service: Not on file    Active member of club or organization: Not on file    Attends meetings of clubs or organizations: Not on file    Relationship status: Not on file  . Intimate partner violence    Fear of current or ex partner: Not on file    Emotionally abused: Not on file    Physically abused: Not on file    Forced sexual activity: Not on file  Other Topics Concern  . Not on file  Social History Narrative  . Not on file    Family History:    Family History  Problem Relation Age of Onset  . Diabetes Mother   . Stroke Mother   . Early death Father   . Heart attack Father   . Alcohol abuse Father   . Heart disease Father   . Hypertension Brother   . Early death Paternal Grandfather   . Heart attack Paternal Grandfather   . Colon cancer Neg Hx   . Esophageal cancer Neg Hx   . Rectal cancer Neg Hx   . Stomach cancer Neg Hx      ROS:  Please see the history of present illness.  Review of Systems  Constitutional: Negative for chills and fever.  HENT: Positive for congestion (mild).   Respiratory:  Positive for shortness of breath. Negative for cough, hemoptysis and sputum production.   Cardiovascular: Positive for chest pain, palpitations, orthopnea and PND. Negative for leg swelling.  Gastrointestinal: Negative for blood in stool, melena, nausea and vomiting.  Genitourinary: Negative for hematuria.  Musculoskeletal: Myalgias: states he always has body aches.  Neurological: Negative for loss of consciousness.  Endo/Heme/Allergies: Does not bruise/bleed easily.  Psychiatric/Behavioral: Positive for substance abuse.       Physical Exam/Data:   Vitals:   11/24/18 0514 11/24/18 0753 11/24/18 0755  BP: (!) 133/96  (!) 126/102  Pulse: (!) 101  (!) 106  Resp: 17  Temp: 98.1 F (36.7 C)    TempSrc: Oral    SpO2: 98% 96% 95%  Weight: 127.6 kg    Height: 6' 4.5" (1.943 m)     No intake or output data in the 24 hours ending 11/24/18 1142 Last 3 Weights 11/24/2018 10/08/2018 07/09/2018  Weight (lbs) 281 lb 3.2 oz 282 lb 12.8 oz 279 lb 9.6 oz  Weight (kg) 127.551 kg 128.277 kg 126.826 kg     Body mass index is 33.78 kg/m.  General:  Well nourished, well developed Caucasian male resting comfortably in no acute distress. HEENT: Normal. Neck: Supple. No JVD Vascular: No carotid bruits. Radial pulses 2+ and equal bilaterally.  Cardiac:  RRR. Distinct S1 and S2. No murmurs, gallops, or rubs. Lungs: No increased work of breathing. Clear to auscultation bilaterally. No wheezes, rhonchi, or rales.  Abd: Soft, non-distended, and non-tender. Bowel sounds present in all 4 quadrants. Ext: 1-2+ ankle edema with trace to 1+ edema of bilateral lower extremities. Musculoskeletal:  No deformities. BUE and BLE strength normal and equal. Skin: Warm and dry. Neuro: No focal deficits. Psych:  Normal affect. Responds appropriately.  EKG:  The EKG was personally reviewed and demonstrates: Sinus tachycardia, rate 106 bpm, with T wave inversions in leads I and aVL, anterior Q waves, and changing biphasic  anterior T waves concerning for anterior MI.   Telemetry:  Telemetry was personally reviewed and demonstrates: Sinus rhythm with rates in the 90's to 110's with PVCs. One short run of non-sustained VT of 7 beats also noted.   Relevant CV Studies:  Echocardiogram 11/24/2018: IMPRESSIONS  1. The left ventricle has severely reduced systolic function, with an ejection fraction of 25-30%. The cavity size was moderate to severely dilated. Left ventricular diastolic Doppler parameters are consistent with pseudonormalization.  2. There is akinesis of the entire apical myocardium, inferoseptal, anteroseptal and anterior myocardium.  3. The right ventricle has normal systolic function. The cavity was normal. There is no increase in right ventricular wall thickness. Right ventricular systolic pressure could not be assessed.  4. Left atrial size was moderately dilated.  5. The interatrial septum appears to be lipomatous.  Laboratory Data:  High Sensitivity Troponin:   Recent Labs  Lab 11/24/18 0613  TROPONINIHS 10,724*     Cardiac EnzymesNo results for input(s): TROPONINI in the last 168 hours. No results for input(s): TROPIPOC in the last 168 hours.  Chemistry Recent Labs  Lab 11/24/18 0613  NA 142  K 3.6  CL 108  CO2 22  GLUCOSE 143*  BUN 13  CREATININE 1.06  CALCIUM 9.3  GFRNONAA >60  GFRAA >60  ANIONGAP 12    Recent Labs  Lab 11/24/18 0613  PROT 7.2  ALBUMIN 3.9  AST 84*  ALT 22  ALKPHOS 65  BILITOT 0.8   Hematology Recent Labs  Lab 11/24/18 0613 11/24/18 1101  WBC 11.6* 12.7*  RBC 5.16 4.82  HGB 15.9 14.7  HCT 47.8 44.6  MCV 92.6 92.5  MCH 30.8 30.5  MCHC 33.3 33.0  RDW 12.5 12.6  PLT 239 218   BNPNo results for input(s): BNP, PROBNP in the last 168 hours.  DDimer No results for input(s): DDIMER in the last 168 hours.   Radiology/Studies:  Portable Chest X-ray 1 View  Result Date: 11/24/2018 CLINICAL DATA:  Acute coronary syndrome. EXAM: PORTABLE CHEST 1  VIEW COMPARISON:  Radiograph of November 24, 2018. FINDINGS: The heart size and mediastinal contours are within normal limits. Both lungs are clear. No  pneumothorax or pleural effusion is noted. The visualized skeletal structures are unremarkable. IMPRESSION: No active disease. Electronically Signed   By: Marijo Conception M.D.   On: 11/24/2018 09:14    Assessment and Plan:   NSTEMI - Patient has had intermittent exertional chest pain over the last 2 weeks. Patient transferred here for [facility]. - Troponin I minimally elevated at [facility]. High-sensitivity troponin here elevated at 10,724 >> 17,810. - EKG shows changing ST/T waves concerning for anterior MI. - Currently on IV Heparin. Will continue. - Will start Aspirin 81mg  daily and Lipitor 80mg  daily. Will hold on starting beta-blocker until after cath because patient has signs/symptoms of acute CHF. - Cardiovascular risk factor includes HTN, HLD, T2DM, and family history with both father and paternal grandfather dying from a MI in their mid 80's. - Patient will need left heart catheterization today. The patient understands that risks include but are not limited to stroke (1 in 1000), death (1 in 62), kidney failure [usually temporary] (1 in 500), bleeding (1 in 200), allergic reaction [possibly serious] (1 in 200), and agrees to proceed.   Acute Systolic CHF - Patient reports recent dyspnea on exertion, orthopnea, and PND. - BNP elevated at 624.9. - Echo shows LVEF of 25-30% with akinesis of the entire apical, inferoseptal, anteroseptal, and anterior myocardium. Likely ischemic.  - Will defer diuresis until after cardiac catheterization.  - Will need to initiate evidence based heart failure medications following cardiac catheterization. - Monitor daily weights, strict I/O's, and renal function.  Non-Sustained VT - Patient had 7 beats of non-sustained VT. - K 3.3 today. Will replete with K-Dur 40 mEq. - Mg 1.9 today. Will replete  with magnesium sulfate 2g. - Will likely need beta-blocker following cardiac catheterization.  Hypertension - Most recent BP 126/102.  - Patient on Benazepril 20mg  at home.   Hyperlipidemia - Lipid panel this admission: Cholesterol 181, Triglycerides 199, HDL 28, LDL 113. - Patient only on fenofibrate at home. - Will start Lipitor 80mg  daily. - Will need repeat lipid panel and CMP in 6 weeks.   Type 2 Diabetes Mellitus - Management per primary team.  Cocaine Use - Patient reports occasional cocaine use and states he last used a couple of weeks ago. Urine drug screen pending. - Emphasized the importance of complete cessation.  For questions or updates, please contact [place] Please consult [email or web address] for contact info under     Signed, Darreld Mclean, PA-C  11/24/2018 11:42 AM  Agree with note by Sande Rives, PA-C  Mr. Nott was transferred from [facility] for further evaluation and treatment of non-STEMI.  He is a 53 year old single moderately overweight Caucasian male father of 1 child who is currently on disability because of arthritis and gout in his feet.  His risk factors include family history, treated hypertension, diabetes and hyperlipidemia.  He has no prior cardiac history.  He developed chest pain a week ago while walking up a hill and since that time he has had recurrent chest pain and progressive dyspnea on exertion.  He presented to [facility] last night and was transferred here for further evaluation.  His enzymes have been progressively increasing with a high-sensitivity troponin of 10,000 rising to 17,000. He is also on IV heparin.  His EKG shows Q waves across the precordium out of the 4 with biphasic ST segments suggesting an evolving anterior myocardial infarction.  2D echo reveals an EF of 25% with segmental wall motion abnormalities.  His exam is benign.  His K is 3.3 which will be repleted with 40 mg of K. Dur.  He will  need diagnostic coronary angiography to further define his anatomy.  He is having mild ongoing chest pain.    Lorretta Harp, M.D., [place], [facility], [place] [address] [place], [place]  [address]  [phone] 11/24/2018 12:50 PM

## 2018-11-24 NOTE — Consult Note (Addendum)
Cardiology Consultation:   Patient ID: Evan [name] MRN: [id number]; DOB: [birth year]  Admit date: 11/24/2018 Date of Consult: 11/24/2018  Primary Care Provider: Dettinger, Fransisca Kaufmann, MD Primary Cardiologist: New to [place] (Dr. Gwenlyn Found) Primary Electrophysiologist:  None    Patient Profile:   Evan [name] is a 53 y.o. male with a history of hypertension, hyperlipidemia, diabetes mellitus, and obesity who is being seen today for the evaluation of chest pain at the request of Dr. Sherral Hammers.   History of Present Illness:   Evan [name] is a 53 year old male with the above history. Patient does not see a Cardiologist. He states he had a stress test 20 years ago after his father died from a heart attack which was reportedly normal.  Patient presented to the [facility] ED on 11/23/2018 for intermittent chest pain over the past 1-2 weeks that has worsened over the last couple of days. Patient reports pressure across his chest mostly with exertion, specifically when walking up hill. Some radiation to left arm at times. He reports associated shortness of breath but denies any nausea or vomiting. He initially thought the pain was his reflux. He also reports recent orthopnea and PND but has not noticed any lower extremity edema. He has had difficulty sleeping the past few days due to difficulty breathing. He also reports some palpitations and some lightheadedness but denies any syncope. No recent fevers, chills, or illnesses.  In the [facility] ED: EKG showed sinus tachycardia, rate 115 bpm, with anteroseptal Q waves and T wave inversion in aVL. Troponin I minimally elevated at 0.23 >> 0.46. Chest x-ray showed mild cardiomegaly with scattered airspace opacities bilaterally and promientn interstitial lung markings which can be seen with developing pulmonary edema or an atypical infectious process. NT-Pro BNP elevated at 1,708. D-dimer elevated at 0.71. Chest CTA showed no evidence of pulmonary embolus  but did note small bilateral pleural effusion and minimal patchy ground-glass opacities felt to be more consistent with pulmonary edema than multifocal infection. WBC 9.3, Hgb 16.0, Plts 248. Na 141, K 3.4, Glucose 151, Scr 0.92.  Patient was transferred to [facility] for further evaluation.  At the time of this evaluation, patient reports left sided chest pressure that has improved some with Nitro. Repeat EKG shows sinus tachycardia with T wave inversion in leas I and aVL, anterior Q waves, and changing T wave in anterior leads concerning for anterior MI. High-sensitivity troponin here elevated at 10,724 >> 17,810.   Patient denies any tobacco use but reports alcohol use about 2 times per week. Also reports occasional cocaine use and states he last used a couple of weeks ago. Urine drug screen pending. Patient has family history significant for CAD with both father and paternal grandfather dying from a heart attack in their mid 15's.  Heart Pathway Score:     Past Medical History:  Diagnosis Date  . Arthritis   . Diabetes mellitus without complication ([place])   . Gout   . Hyperlipidemia   . Hypertension   . Morbid obesity ([place]) 12/22/2016    Past Surgical History:  Procedure Laterality Date  . ANKLE ARTHROSCOPY Right   . CYST REMOVAL HAND    . KNEE ARTHROSCOPY Left      Inpatient Medications: Scheduled Meds: . [START ON 11/25/2018] aspirin EC  81 mg Oral Daily  . folic acid  1 mg Intravenous Daily  . insulin aspart  0-9 Units Subcutaneous Q4H  . thiamine injection  100 mg Intravenous  Daily   Continuous Infusions: . sodium chloride 75 mL/hr at 11/24/18 0858  . heparin 1,000 Units/hr (11/24/18 0857)   PRN Meds: acetaminophen, LORazepam, morphine injection, nitroGLYCERIN, ondansetron (ZOFRAN) IV, sodium chloride flush, zolpidem  Allergies:   No Known Allergies  Social History:   Social History   Socioeconomic History  . Marital status: Single    Spouse name: Not on file  .  Number of children: Not on file  . Years of education: Not on file  . Highest education level: Not on file  Occupational History  . Not on file  Social Needs  . Financial resource strain: Not on file  . Food insecurity    Worry: Not on file    Inability: Not on file  . Transportation needs    Medical: Not on file    Non-medical: Not on file  Tobacco Use  . Smoking status: Never Smoker  . Smokeless tobacco: Never Used  Substance and Sexual Activity  . Alcohol use: Yes    Alcohol/week: 12.0 standard drinks    Types: 12 Cans of beer per week  . Drug use: No  . Sexual activity: Yes    Comment: male 8 months partner  Lifestyle  . Physical activity    Days per week: Not on file    Minutes per session: Not on file  . Stress: Not on file  Relationships  . Social Herbalist on phone: Not on file    Gets together: Not on file    Attends religious service: Not on file    Active member of club or organization: Not on file    Attends meetings of clubs or organizations: Not on file    Relationship status: Not on file  . Intimate partner violence    Fear of current or ex partner: Not on file    Emotionally abused: Not on file    Physically abused: Not on file    Forced sexual activity: Not on file  Other Topics Concern  . Not on file  Social History Narrative  . Not on file    Family History:    Family History  Problem Relation Age of Onset  . Diabetes Mother   . Stroke Mother   . Early death Father   . Heart attack Father   . Alcohol abuse Father   . Heart disease Father   . Hypertension Brother   . Early death Paternal Grandfather   . Heart attack Paternal Grandfather   . Colon cancer Neg Hx   . Esophageal cancer Neg Hx   . Rectal cancer Neg Hx   . Stomach cancer Neg Hx      ROS:  Please see the history of present illness.  Review of Systems  Constitutional: Negative for chills and fever.  HENT: Positive for congestion (mild).   Respiratory:  Positive for shortness of breath. Negative for cough, hemoptysis and sputum production.   Cardiovascular: Positive for chest pain, palpitations, orthopnea and PND. Negative for leg swelling.  Gastrointestinal: Negative for blood in stool, melena, nausea and vomiting.  Genitourinary: Negative for hematuria.  Musculoskeletal: Myalgias: states he always has body aches.  Neurological: Negative for loss of consciousness.  Endo/Heme/Allergies: Does not bruise/bleed easily.  Psychiatric/Behavioral: Positive for substance abuse.       Physical Exam/Data:   Vitals:   11/24/18 0514 11/24/18 0753 11/24/18 0755  BP: (!) 133/96  (!) 126/102  Pulse: (!) 101  (!) 106  Resp: 17  Temp: 98.1 F (36.7 C)    TempSrc: Oral    SpO2: 98% 96% 95%  Weight: 127.6 kg    Height: 6' 4.5" (1.943 m)     No intake or output data in the 24 hours ending 11/24/18 1142 Last 3 Weights 11/24/2018 10/08/2018 07/09/2018  Weight (lbs) 281 lb 3.2 oz 282 lb 12.8 oz 279 lb 9.6 oz  Weight (kg) 127.551 kg 128.277 kg 126.826 kg     Body mass index is 33.78 kg/m.  General:  Well nourished, well developed Caucasian male resting comfortably in no acute distress. HEENT: Normal. Neck: Supple. No JVD Vascular: No carotid bruits. Radial pulses 2+ and equal bilaterally.  Cardiac:  RRR. Distinct S1 and S2. No murmurs, gallops, or rubs. Lungs: No increased work of breathing. Clear to auscultation bilaterally. No wheezes, rhonchi, or rales.  Abd: Soft, non-distended, and non-tender. Bowel sounds present in all 4 quadrants. Ext: 1-2+ ankle edema with trace to 1+ edema of bilateral lower extremities. Musculoskeletal:  No deformities. BUE and BLE strength normal and equal. Skin: Warm and dry. Neuro: No focal deficits. Psych:  Normal affect. Responds appropriately.  EKG:  The EKG was personally reviewed and demonstrates: Sinus tachycardia, rate 106 bpm, with T wave inversions in leads I and aVL, anterior Q waves, and changing biphasic  anterior T waves concerning for anterior MI.   Telemetry:  Telemetry was personally reviewed and demonstrates: Sinus rhythm with rates in the 90's to 110's with PVCs. One short run of non-sustained VT of 7 beats also noted.   Relevant CV Studies:  Echocardiogram 11/24/2018: IMPRESSIONS  1. The left ventricle has severely reduced systolic function, with an ejection fraction of 25-30%. The cavity size was moderate to severely dilated. Left ventricular diastolic Doppler parameters are consistent with pseudonormalization.  2. There is akinesis of the entire apical myocardium, inferoseptal, anteroseptal and anterior myocardium.  3. The right ventricle has normal systolic function. The cavity was normal. There is no increase in right ventricular wall thickness. Right ventricular systolic pressure could not be assessed.  4. Left atrial size was moderately dilated.  5. The interatrial septum appears to be lipomatous.  Laboratory Data:  High Sensitivity Troponin:   Recent Labs  Lab 11/24/18 0613  TROPONINIHS 10,724*     Cardiac EnzymesNo results for input(s): TROPONINI in the last 168 hours. No results for input(s): TROPIPOC in the last 168 hours.  Chemistry Recent Labs  Lab 11/24/18 0613  NA 142  K 3.6  CL 108  CO2 22  GLUCOSE 143*  BUN 13  CREATININE 1.06  CALCIUM 9.3  GFRNONAA >60  GFRAA >60  ANIONGAP 12    Recent Labs  Lab 11/24/18 0613  PROT 7.2  ALBUMIN 3.9  AST 84*  ALT 22  ALKPHOS 65  BILITOT 0.8   Hematology Recent Labs  Lab 11/24/18 0613 11/24/18 1101  WBC 11.6* 12.7*  RBC 5.16 4.82  HGB 15.9 14.7  HCT 47.8 44.6  MCV 92.6 92.5  MCH 30.8 30.5  MCHC 33.3 33.0  RDW 12.5 12.6  PLT 239 218   BNPNo results for input(s): BNP, PROBNP in the last 168 hours.  DDimer No results for input(s): DDIMER in the last 168 hours.   Radiology/Studies:  Portable Chest X-ray 1 View  Result Date: 11/24/2018 CLINICAL DATA:  Acute coronary syndrome. EXAM: PORTABLE CHEST 1  VIEW COMPARISON:  Radiograph of November 24, 2018. FINDINGS: The heart size and mediastinal contours are within normal limits. Both lungs are clear. No  pneumothorax or pleural effusion is noted. The visualized skeletal structures are unremarkable. IMPRESSION: No active disease. Electronically Signed   By: Marijo Conception M.D.   On: 11/24/2018 09:14    Assessment and Plan:   NSTEMI - Patient has had intermittent exertional chest pain over the last 2 weeks. Patient transferred here for [facility]. - Troponin I minimally elevated at [facility]. High-sensitivity troponin here elevated at 10,724 >> 17,810. - EKG shows changing ST/T waves concerning for anterior MI. - Currently on IV Heparin. Will continue. - Will start Aspirin 81mg  daily and Lipitor 80mg  daily. Will hold on starting beta-blocker until after cath because patient has signs/symptoms of acute CHF. - Cardiovascular risk factor includes HTN, HLD, T2DM, and family history with both father and paternal grandfather dying from a MI in their mid 23's. - Patient will need left heart catheterization today. The patient understands that risks include but are not limited to stroke (1 in 1000), death (1 in 8), kidney failure [usually temporary] (1 in 500), bleeding (1 in 200), allergic reaction [possibly serious] (1 in 200), and agrees to proceed.   Acute Systolic CHF - Patient reports recent dyspnea on exertion, orthopnea, and PND. - BNP elevated at 624.9. - Echo shows LVEF of 25-30% with akinesis of the entire apical, inferoseptal, anteroseptal, and anterior myocardium. Likely ischemic.  - Will defer diuresis until after cardiac catheterization.  - Will need to initiate evidence based heart failure medications following cardiac catheterization. - Monitor daily weights, strict I/O's, and renal function.  Non-Sustained VT - Patient had 7 beats of non-sustained VT. - K 3.3 today. Will replete with K-Dur 40 mEq. - Mg 1.9 today. Will replete  with magnesium sulfate 2g. - Will likely need beta-blocker following cardiac catheterization.  Hypertension - Most recent BP 126/102.  - Patient on Benazepril 20mg  at home.   Hyperlipidemia - Lipid panel this admission: Cholesterol 181, Triglycerides 199, HDL 28, LDL 113. - Patient only on fenofibrate at home. - Will start Lipitor 80mg  daily. - Will need repeat lipid panel and CMP in 6 weeks.   Type 2 Diabetes Mellitus - Management per primary team.  Cocaine Use - Patient reports occasional cocaine use and states he last used a couple of weeks ago. Urine drug screen pending. - Emphasized the importance of complete cessation.  For questions or updates, please contact [place] Please consult [email or web address] for contact info under     Signed, Darreld Mclean, PA-C  11/24/2018 11:42 AM  Agree with note by Sande Rives, PA-C  Evan [name] was transferred from [facility] for further evaluation and treatment of non-STEMI.  He is a 53 year old single moderately overweight Caucasian male father of 1 child who is currently on disability because of arthritis and gout in his feet.  His risk factors include family history, treated hypertension, diabetes and hyperlipidemia.  He has no prior cardiac history.  He developed chest pain a week ago while walking up a hill and since that time he has had recurrent chest pain and progressive dyspnea on exertion.  He presented to [facility] last night and was transferred here for further evaluation.  His enzymes have been progressively increasing with a high-sensitivity troponin of 10,000 rising to 17,000. He is also on IV heparin.  His EKG shows Q waves across the precordium out of the 4 with biphasic ST segments suggesting an evolving anterior myocardial infarction.  2D echo reveals an EF of 25% with segmental wall motion abnormalities.  His exam is benign.  His K is 3.3 which will be repleted with 40 mg of K. Dur.  He will  need diagnostic coronary angiography to further define his anatomy.  He is having mild ongoing chest pain.    Lorretta Harp, M.D., [place], [facility], [place] [address] [place], [place]  [address]  [phone] 11/24/2018 12:50 PM

## 2018-11-24 NOTE — Progress Notes (Signed)
*  PRELIMINARY RESULTS* Echocardiogram 2D Echocardiogram has been performed.  Evan [name] 11/24/2018, 8:59 AM

## 2018-11-24 NOTE — Progress Notes (Signed)
ANTICOAGULATION CONSULT NOTE  Pharmacy Consult for Heparin  Indication: chest pain/ACS  No Known Allergies  Patient Measurements: Height: 6' 4.5" (194.3 cm) Weight: 281 lb 3.2 oz (127.6 kg) IBW/kg (Calculated) : 87.95  Heparin dosing weight: 115 kg  Vital Signs: Temp: 98.1 F (36.7 C) (07/08 0514) Temp Source: Oral (07/08 0514) BP: 126/102 (07/08 0755) Pulse Rate: 106 (07/08 0755)  Labs: Recent Labs    11/24/18 0613 11/24/18 1101 11/24/18 1102  HGB 15.9 14.7  --   HCT 47.8 44.6  --   PLT 239 218  --   LABPROT 14.2  --   --   INR 1.1  --   --   HEPARINUNFRC  --   --  <0.10*  CREATININE 1.06  --   --   TROPONINIHS 10,724*  --   --     Estimated Creatinine Clearance: 118.3 mL/min (by C-G formula based on SCr of 1.06 mg/dL).   Medical History: Past Medical History:  Diagnosis Date  . Arthritis   . Diabetes mellitus without complication ([place])   . Gout   . Hyperlipidemia   . Hypertension   . Morbid obesity ([place]) 12/22/2016   Assessment: 53 y/o M transfer from outside hospital with heparin running at 1000 units/hr, current heparin level undetectable.   Goal of Therapy:  Heparin level 0.3-0.7 units/ml Monitor platelets by anticoagulation protocol: Yes   Plan:  - Heparin bolus 3500 units  - Increase heparin drip to 1400 units/hr -Check 6 hour heparin level and adjust dose as needed  Cristela Felt, PharmD PGY1 Pharmacy Resident [employer]: [phone]

## 2018-11-25 ENCOUNTER — Encounter (HOSPITAL_COMMUNITY): Payer: Self-pay | Admitting: Cardiovascular Disease

## 2018-11-25 DIAGNOSIS — E1165 Type 2 diabetes mellitus with hyperglycemia: Secondary | ICD-10-CM

## 2018-11-25 DIAGNOSIS — I249 Acute ischemic heart disease, unspecified: Secondary | ICD-10-CM

## 2018-11-25 DIAGNOSIS — E1169 Type 2 diabetes mellitus with other specified complication: Secondary | ICD-10-CM

## 2018-11-25 DIAGNOSIS — E118 Type 2 diabetes mellitus with unspecified complications: Secondary | ICD-10-CM

## 2018-11-25 DIAGNOSIS — I214 Non-ST elevation (NSTEMI) myocardial infarction: Secondary | ICD-10-CM

## 2018-11-25 DIAGNOSIS — I10 Essential (primary) hypertension: Secondary | ICD-10-CM

## 2018-11-25 DIAGNOSIS — E1159 Type 2 diabetes mellitus with other circulatory complications: Secondary | ICD-10-CM

## 2018-11-25 DIAGNOSIS — E785 Hyperlipidemia, unspecified: Secondary | ICD-10-CM

## 2018-11-25 LAB — CBC
HCT: 44.8 % (ref 39.0–52.0)
Hemoglobin: 14.7 g/dL (ref 13.0–17.0)
MCH: 30.6 pg (ref 26.0–34.0)
MCHC: 32.8 g/dL (ref 30.0–36.0)
MCV: 93.1 fL (ref 80.0–100.0)
Platelets: 180 10*3/uL (ref 150–400)
RBC: 4.81 MIL/uL (ref 4.22–5.81)
RDW: 12.8 % (ref 11.5–15.5)
WBC: 11.4 10*3/uL — ABNORMAL HIGH (ref 4.0–10.5)
nRBC: 0 % (ref 0.0–0.2)

## 2018-11-25 LAB — LIPID PANEL
Cholesterol: 158 mg/dL (ref 0–200)
HDL: 32 mg/dL — ABNORMAL LOW (ref >40)
LDL Cholesterol: 95 mg/dL (ref 0–99)
Total CHOL/HDL Ratio: 4.9 RATIO
Triglycerides: 156 mg/dL — ABNORMAL HIGH (ref <150)
VLDL: 31 mg/dL (ref 0–40)

## 2018-11-25 LAB — HIV ANTIBODY (ROUTINE TESTING W REFLEX): HIV Screen 4th Generation wRfx: NONREACTIVE

## 2018-11-25 LAB — GLUCOSE, CAPILLARY
Glucose-Capillary: 146 mg/dL — ABNORMAL HIGH (ref 70–99)
Glucose-Capillary: 157 mg/dL — ABNORMAL HIGH (ref 70–99)
Glucose-Capillary: 89 mg/dL (ref 70–99)
Glucose-Capillary: 92 mg/dL (ref 70–99)
Glucose-Capillary: 97 mg/dL (ref 70–99)
Glucose-Capillary: 99 mg/dL (ref 70–99)

## 2018-11-25 LAB — BASIC METABOLIC PANEL
Anion gap: 12 (ref 5–15)
BUN: 14 mg/dL (ref 6–20)
CO2: 21 mmol/L — ABNORMAL LOW (ref 22–32)
Calcium: 8.2 mg/dL — ABNORMAL LOW (ref 8.9–10.3)
Chloride: 105 mmol/L (ref 98–111)
Creatinine, Ser: 0.97 mg/dL (ref 0.61–1.24)
GFR calc Af Amer: >60 mL/min (ref >60)
GFR calc non Af Amer: >60 mL/min (ref >60)
Glucose, Bld: 99 mg/dL (ref 70–99)
Potassium: 3.2 mmol/L — ABNORMAL LOW (ref 3.5–5.1)
Sodium: 138 mmol/L (ref 135–145)

## 2018-11-25 LAB — HEMOGLOBIN A1C
Hgb A1c MFr Bld: 7 % — ABNORMAL HIGH (ref 4.8–5.6)
Mean Plasma Glucose: 154 mg/dL

## 2018-11-25 MED ORDER — INSULIN ASPART 100 UNIT/ML ~~LOC~~ SOLN
0.0000 [IU] | Freq: Three times a day (TID) | SUBCUTANEOUS | Status: DC
  Administered 2018-11-26 – 2018-11-27 (×3): 1 [IU] via SUBCUTANEOUS
  Administered 2018-11-27: 2 [IU] via SUBCUTANEOUS

## 2018-11-25 MED ORDER — SODIUM CHLORIDE 0.9 % WEIGHT BASED INFUSION
3.0000 mL/kg/h | INTRAVENOUS | Status: DC
  Administered 2018-11-26: 3 mL/kg/h via INTRAVENOUS

## 2018-11-25 MED ORDER — ANGIOPLASTY BOOK
Freq: Once | Status: AC
  Administered 2018-11-25: 02:00:00
  Filled 2018-11-25: qty 1

## 2018-11-25 MED ORDER — HEART ATTACK BOUNCING BOOK
Freq: Once | Status: AC
  Administered 2018-11-25: 02:00:00
  Filled 2018-11-25: qty 1

## 2018-11-25 MED ORDER — SPIRONOLACTONE 12.5 MG HALF TABLET
12.5000 mg | ORAL_TABLET | Freq: Every day | ORAL | Status: DC
  Administered 2018-11-25 – 2018-11-28 (×3): 12.5 mg via ORAL
  Filled 2018-11-25 (×4): qty 1

## 2018-11-25 MED ORDER — SODIUM CHLORIDE 0.9 % WEIGHT BASED INFUSION: 1.0000 mL/kg/h | INTRAVENOUS | Status: DC

## 2018-11-25 MED ORDER — THE SENSUOUS HEART BOOK
Freq: Once | Status: AC
  Administered 2018-11-25: 02:00:00
  Filled 2018-11-25: qty 1

## 2018-11-25 MED ORDER — CARVEDILOL 6.25 MG PO TABS
6.2500 mg | ORAL_TABLET | Freq: Two times a day (BID) | ORAL | Status: DC
  Administered 2018-11-25 – 2018-11-27 (×5): 6.25 mg via ORAL
  Filled 2018-11-25 (×5): qty 1

## 2018-11-25 MED ORDER — POTASSIUM CHLORIDE CRYS ER 20 MEQ PO TBCR
40.0000 meq | EXTENDED_RELEASE_TABLET | Freq: Once | ORAL | Status: AC
  Administered 2018-11-25: 40 meq via ORAL
  Filled 2018-11-25: qty 2

## 2018-11-25 MED FILL — Heparin Sod (Porcine) in NaCl IV Soln 25000 Unit/250ML-0.45%: INTRAVENOUS | Qty: 250 | Status: AC

## 2018-11-25 MED FILL — Lidocaine HCl Local Preservative Free (PF) Inj 1%: INTRAMUSCULAR | Qty: 30 | Status: AC

## 2018-11-25 NOTE — Progress Notes (Signed)
PROGRESS NOTE    Evan [name]  EXB:[id number] DOB: 11-19-1965 DOA: 11/24/2018 PCP: Dettinger, Fransisca Kaufmann, MD   Brief Narrative:  HPI On 11/24/2018 by Dr. Dia Crawford  Evan [name] is a 53 y.o. WM PMHx diabetes type 2 uncontrolled with complication, HTN, HLD, gout, morbid obesity.  States over the last week has had multiple episodes of chest pain centered substernal/left chest with radiation down left arm and into left neck most episodes have been secondary to exertion and relieved with rest.  Positive S OB, negative diaphoresis, positive nausea, negative vomiting.  Currently negative chest pain after NTG and morphine.  Assessment & Plan   Late presenting Anterior STEMI -Patient presented with positive troponin.  EKG suggestive of anterior lateral MI -Echocardiogram EF of 25 to 30%.  LV diastolic parameters consistent with pseudonormalization.  Akinesis of the entire apical myocardium, inferior septal, anterior septal and anterior myocardium. -Cardiology consulted and appreciated -Left heart cath: Severe three-vessel coronary artery disease.  Occluded ostial and mid LAD.  Significant distal left circumflex disease.  Mid RCA stenosis.  EF of 20 to 25%.  Patient did receive 2 drug-eluting stents-ostial and mid LAD.  Commendations were to start IV Lasix followed by oral Lasix and Coreg.  Consider small dose ARB spironolactone. Dual antiplatelet therapy for 1 year.  Recommend staged mid RCA PCI on Friday, 11/26/2018.  -lipid panel: TC 158, LDL 95, TG 156, HDL 32 -Currently, no chest pain. -Continue aspirin, Brilinta, statin, Coreg  Acute systolic CHF/ischemic cardiomyopathy -Echocardiogram and cardiac catheterization as above -Patient was given IV Lasix x1 dose and now on oral Lasix -Breathing has improved -Currently euvolemic on exam -Pending further cardiac recommendations regarding spironolactone and ARB  Essential hypertension -BP stable, continue Coreg and oral Lasix  Alcohol and  substance abuse -Continue CIWA protocol -Drug abuse positive for opiates and cocaine -Alcohol level was negative -Discussed cessation  Diabetes mellitus, type II -hemoglobin A1c 7 -Continue insulin sliding scale and CBG monitoring  Hyperlipidemia -Lipid panel as above, continue statin -Patient will need repeat LFTs and lipid panel in 6 weeks  Nonsustained VT -Potassium 3.2, currently getting supplementation -Will check magnesium level -Currently asymptomatic  Obesity -BMI 34.03  DVT Prophylaxis  lovenox  Code Status: Full  Family Communication: None at bedside  Disposition Plan: Admitted. Will need cath on 7/10. Pending further cardiology recommendations. Suspect home when stable.   Consultants Cardiology  Procedures  Echocardiogram Cardiac catheterization  Antibiotics   Anti-infectives (From admission, onward)   None      Subjective:   Evan [name] seen and examined today.  Complains about being woken up this morning several times. Denies current chest pain, shortness of breath, abdominal pain, nausea, vomiting, diarrhea, constipation, dizziness, headache.   Objective:   Vitals:   11/24/18 2119 11/25/18 0059 11/25/18 0636 11/25/18 0637  BP: 115/77 (!) 117/93  (!) 131/98  Pulse: 98 93  96  Resp: 16   15  Temp: 97.7 F (36.5 C)   97.9 F (36.6 C)  TempSrc: Oral   Oral  SpO2: 98% 95%  96%  Weight:   128.5 kg   Height:        Intake/Output Summary (Last 24 hours) at 11/25/2018 0858 Last data filed at 11/25/2018 0544 Gross per 24 hour  Intake 520.45 ml  Output -  Net 520.45 ml   Filed Weights   11/24/18 0514 11/25/18 0636  Weight: 127.6 kg 128.5 kg    Exam  General: Well developed, well nourished, NAD,  appears stated age  89: NCAT, mucous membranes moist.   Neck: Supple  Cardiovascular: S1 S2 auscultated, no rubs, murmurs or gallops. Regular rate and rhythm.  Respiratory: Clear to auscultation bilaterally  Abdomen: Soft, nontender,  nondistended, + bowel sounds  Extremities: warm dry without cyanosis clubbing or edema  Neuro: AAOx3, nonfocal  Psych: Normal affect and demeanor    Data Reviewed: I have personally reviewed following labs and imaging studies  CBC: Recent Labs  Lab 11/24/18 0613 11/24/18 1101 11/24/18 1500 11/25/18 0431  WBC 11.6* 12.7* 15.3* 11.4*  NEUTROABS 8.7* 10.0*  --   --   HGB 15.9 14.7 16.6 14.7  HCT 47.8 44.6 50.3 44.8  MCV 92.6 92.5 93.3 93.1  PLT 239 218 253 423   Basic Metabolic Panel: Recent Labs  Lab 11/24/18 0613 11/24/18 1101 11/24/18 1500 11/25/18 0431  NA 142 142  --  138  K 3.6 3.3*  --  3.2*  CL 108 109  --  105  CO2 22 22  --  21*  GLUCOSE 143* 116*  --  99  BUN 13 13  --  14  CREATININE 1.06 0.92 1.07 0.97  CALCIUM 9.3 8.7*  --  8.2*  MG  --  1.9  --   --    GFR: Estimated Creatinine Clearance: 129.8 mL/min (by C-G formula based on SCr of 0.97 mg/dL). Liver Function Tests: Recent Labs  Lab 11/24/18 0613 11/24/18 1101  AST 84* 109*  ALT 22 26  ALKPHOS 65 57  BILITOT 0.8 1.0  PROT 7.2 6.4*  ALBUMIN 3.9 3.6   No results for input(s): LIPASE, AMYLASE in the last 168 hours. No results for input(s): AMMONIA in the last 168 hours. Coagulation Profile: Recent Labs  Lab 11/24/18 0613  INR 1.1   Cardiac Enzymes: No results for input(s): CKTOTAL, CKMB, CKMBINDEX, TROPONINI in the last 168 hours. BNP (last 3 results) No results for input(s): PROBNP in the last 8760 hours. HbA1C: Recent Labs    11/24/18 1101  HGBA1C 7.0*   CBG: Recent Labs  Lab 11/24/18 1521 11/24/18 2123 11/25/18 0054 11/25/18 0428 11/25/18 0716  GLUCAP 99 133* 97 92 89   Lipid Profile: Recent Labs    11/24/18 1102 11/25/18 0431  CHOL 181 158  HDL 28* 32*  LDLCALC 113* 95  TRIG 199* 156*  CHOLHDL 6.5 4.9   Thyroid Function Tests: Recent Labs    11/24/18 1057  TSH 1.022   Anemia Panel: No results for input(s): VITAMINB12, FOLATE, FERRITIN, TIBC, IRON,  RETICCTPCT in the last 72 hours. Urine analysis: No results found for: COLORURINE, APPEARANCEUR, LABSPEC, PHURINE, GLUCOSEU, HGBUR, BILIRUBINUR, KETONESUR, PROTEINUR, UROBILINOGEN, NITRITE, LEUKOCYTESUR Sepsis Labs: @LABRCNTIP (procalcitonin:4,lacticidven:4)  ) Recent Results (from the past 240 hour(s))  SARS Coronavirus 2 (CEPHEID - Performed in [place] hospital lab), Hosp Order     Status: None   Collection Time: 11/24/18  8:27 AM   Specimen: Nasopharyngeal Swab  Result Value Ref Range Status   SARS Coronavirus 2 NEGATIVE NEGATIVE Final    Comment: (NOTE) If result is NEGATIVE SARS-CoV-2 target nucleic acids are NOT DETECTED. The SARS-CoV-2 RNA is generally detectable in upper and lower  respiratory specimens during the acute phase of infection. The lowest  concentration of SARS-CoV-2 viral copies this assay can detect is 250  copies / mL. A negative result does not preclude SARS-CoV-2 infection  and should not be used as the sole basis for treatment or other  patient management decisions.  A negative result  may occur with  improper specimen collection / handling, submission of specimen other  than nasopharyngeal swab, presence of viral mutation(s) within the  areas targeted by this assay, and inadequate number of viral copies  (<250 copies / mL). A negative result must be combined with clinical  observations, patient history, and epidemiological information. If result is POSITIVE SARS-CoV-2 target nucleic acids are DETECTED. The SARS-CoV-2 RNA is generally detectable in upper and lower  respiratory specimens dur ing the acute phase of infection.  Positive  results are indicative of active infection with SARS-CoV-2.  Clinical  correlation with patient history and other diagnostic information is  necessary to determine patient infection status.  Positive results do  not rule out bacterial infection or co-infection with other viruses. If result is PRESUMPTIVE POSTIVE  SARS-CoV-2 nucleic acids MAY BE PRESENT.   A presumptive positive result was obtained on the submitted specimen  and confirmed on repeat testing.  While 2019 novel coronavirus  (SARS-CoV-2) nucleic acids may be present in the submitted sample  additional confirmatory testing may be necessary for epidemiological  and / or clinical management purposes  to differentiate between  SARS-CoV-2 and other Sarbecovirus currently known to infect humans.  If clinically indicated additional testing with an alternate test  methodology [phone]) is advised. The SARS-CoV-2 RNA is generally  detectable in upper and lower respiratory sp ecimens during the acute  phase of infection. The expected result is Negative. Fact Sheet for Patients:  StrictlyIdeas.no Fact Sheet for Healthcare Providers: BankingDealers.co.za This test is not yet approved or cleared by the [place] FDA and has been authorized for detection and/or diagnosis of SARS-CoV-2 by FDA under an Emergency Use Authorization (EUA).  This EUA will remain in effect (meaning this test can be used) for the duration of the COVID-19 declaration under Section 564(b)(1) of the Act, 21 U.S.C. section 360bbb-3(b)(1), unless the authorization is terminated or revoked sooner. Performed at [facility], [place] [address], [place], [place] [address]       Radiology Studies: Portable Chest X-ray 1 View  Result Date: 11/24/2018 CLINICAL DATA:  Acute coronary syndrome. EXAM: PORTABLE CHEST 1 VIEW COMPARISON:  Radiograph of November 24, 2018. FINDINGS: The heart size and mediastinal contours are within normal limits. Both lungs are clear. No pneumothorax or pleural effusion is noted. The visualized skeletal structures are unremarkable. IMPRESSION: No active disease. Electronically Signed   By: Marijo Conception M.D.   On: 11/24/2018 09:14     Scheduled Meds: . allopurinol  300 mg Oral Daily  . aspirin EC  81  mg Oral Daily  . atorvastatin  80 mg Oral q1800  . carvedilol  3.125 mg Oral BID WC  . enoxaparin (LOVENOX) injection  40 mg Subcutaneous Q24H  . famotidine  20 mg Oral BID  . folic acid  1 mg Oral Daily  . furosemide  40 mg Oral BID  . insulin aspart  0-9 Units Subcutaneous Q4H  . insulin detemir  30 Units Subcutaneous QHS  . thiamine  100 mg Oral Daily  . ticagrelor  90 mg Oral BID   Continuous Infusions: . [START ON 11/26/2018] sodium chloride     Followed by  . [START ON 11/26/2018] sodium chloride       LOS: 1 day   Time Spent in minutes   45 minutes  Mabeline Varas D.O. on 11/25/2018 at 8:58 AM  Between 7am to 7pm - Please see pager noted on [email or web address]  After 7pm go to  [email or web address]  And look for the night coverage person covering for me after hours  [facility] Office  [phone]

## 2018-11-25 NOTE — Progress Notes (Signed)
[id number] Pt stated he has been up in room to bathroom and in room and had a little chest tightness . Also very tired so did not walk with pt. MI education and CHF ed completed except for ex ed. Gave pt CHF and MI booklets and discussed. Reviewed importance of 2000 mg sodium restriction, 2LFR, daily weights and when to call MD with signs/symptoms of CHF.Marland Kitchen Pt stated he has scales at home. Discussed MI restrictions, NTG use, heart healthy and carb counting, and CRP 2.  Referred to [facility] program. No smart phone for virtual ex program. Gave diets sheets for diabetes, low sodium and heart healthy. Pt could answer teach back questions even though he was tired. Graylon Good RN BSN 11/25/2018 9:36 AM

## 2018-11-25 NOTE — Progress Notes (Addendum)
Progress Note  Patient Name: Evan [name] Date of Encounter: 11/25/2018  Primary Cardiologist: New to Dr. Gwenlyn Found   Subjective   No recurrent chest pain.  Inpatient Medications    Scheduled Meds: . allopurinol  300 mg Oral Daily  . aspirin EC  81 mg Oral Daily  . atorvastatin  80 mg Oral q1800  . carvedilol  3.125 mg Oral BID WC  . enoxaparin (LOVENOX) injection  40 mg Subcutaneous Q24H  . famotidine  20 mg Oral BID  . folic acid  1 mg Oral Daily  . furosemide  40 mg Oral BID  . insulin aspart  0-9 Units Subcutaneous Q4H  . insulin detemir  30 Units Subcutaneous QHS  . thiamine  100 mg Oral Daily  . ticagrelor  90 mg Oral BID   Continuous Infusions: . [START ON 11/26/2018] sodium chloride     Followed by  . [START ON 11/26/2018] sodium chloride     PRN Meds: acetaminophen, LORazepam, morphine injection, nitroGLYCERIN, ondansetron (ZOFRAN) IV, sodium chloride flush, zolpidem   Vital Signs    Vitals:   11/24/18 2119 11/25/18 0059 11/25/18 0636 11/25/18 0637  BP: 115/77 (!) 117/93  (!) 131/98  Pulse: 98 93  96  Resp: 16   15  Temp: 97.7 F (36.5 C)   97.9 F (36.6 C)  TempSrc: Oral   Oral  SpO2: 98% 95%  96%  Weight:   128.5 kg   Height:        Intake/Output Summary (Last 24 hours) at 11/25/2018 0903 Last data filed at 11/25/2018 0544 Gross per 24 hour  Intake 520.45 ml  Output -  Net 520.45 ml   Last 3 Weights 11/25/2018 11/24/2018 10/08/2018  Weight (lbs) 283 lb 4.7 oz 281 lb 3.2 oz 282 lb 12.8 oz  Weight (kg) 128.5 kg 127.551 kg 128.277 kg      Telemetry    Sinus rhythm at controlled ventricular rate, 6 beats of nonsustained VT- Personally Reviewed  ECG    Sinus rhythm with T wave inversion laterally and nonspecific ST elevation in anterior leads- Personally Reviewed  Physical Exam   GEN: No acute distress.   Neck: No JVD Cardiac: RRR, no murmurs, rubs, or gallops.  Radial cath site stable. Respiratory: Clear to auscultation bilaterally. GI: Soft,  nontender, non-distended  MS: No edema; No deformity. Neuro:  Nonfocal  Psych: Normal affect   Labs    High Sensitivity Troponin:   Recent Labs  Lab 11/24/18 0613 11/24/18 1101  TROPONINIHS 10,724* 17,810*     Chemistry Recent Labs  Lab 11/24/18 0613 11/24/18 1101 11/24/18 1500 11/25/18 0431  NA 142 142  --  138  K 3.6 3.3*  --  3.2*  CL 108 109  --  105  CO2 22 22  --  21*  GLUCOSE 143* 116*  --  99  BUN 13 13  --  14  CREATININE 1.06 0.92 1.07 0.97  CALCIUM 9.3 8.7*  --  8.2*  PROT 7.2 6.4*  --   --   ALBUMIN 3.9 3.6  --   --   AST 84* 109*  --   --   ALT 22 26  --   --   ALKPHOS 65 57  --   --   BILITOT 0.8 1.0  --   --   GFRNONAA >60 >60 >60 >60  GFRAA >60 >60 >60 >60  ANIONGAP 12 11  --  12     Hematology Recent Labs  Lab 11/24/18 1101 11/24/18 1500 11/25/18 0431  WBC 12.7* 15.3* 11.4*  RBC 4.82 5.39 4.81  HGB 14.7 16.6 14.7  HCT 44.6 50.3 44.8  MCV 92.5 93.3 93.1  MCH 30.5 30.8 30.6  MCHC 33.0 33.0 32.8  RDW 12.6 12.8 12.8  PLT 218 253 180    BNP Recent Labs  Lab 11/24/18 1101  BNP 624.9*      Radiology    Portable Chest X-ray 1 View  Result Date: 11/24/2018 CLINICAL DATA:  Acute coronary syndrome. EXAM: PORTABLE CHEST 1 VIEW COMPARISON:  Radiograph of November 24, 2018. FINDINGS: The heart size and mediastinal contours are within normal limits. Both lungs are clear. No pneumothorax or pleural effusion is noted. The visualized skeletal structures are unremarkable. IMPRESSION: No active disease. Electronically Signed   By: Marijo Conception M.D.   On: 11/24/2018 09:14    Cardiac Studies   CORONARY STENT INTERVENTION   11/25/2018  LEFT HEART CATH AND CORONARY ANGIOGRAPHY  Conclusion    Prox RCA to Mid RCA lesion is 95% stenosed.  Dist RCA lesion is 30% stenosed.  RPAV lesion is 100% stenosed.  Dist Cx lesion is 99% stenosed.  There is severe left ventricular systolic dysfunction.  LV end diastolic pressure is severely elevated.   The left ventricular ejection fraction is less than 25% by visual estimate.  Ost LAD to Prox LAD lesion is 100% stenosed.  Post intervention, there is a 0% residual stenosis.  A drug-eluting stent was successfully placed using a STENT RESOLUTE ONYX 3.5X22.  A drug-eluting stent was successfully placed using a [place] [id number].  Mid LAD-1 lesion is 30% stenosed.  Mid LAD-2 lesion is 100% stenosed.  Post intervention, there is a 0% residual stenosis.   1.  Severe three-vessel coronary artery disease.  Occluded ostial and mid LAD likely due to late presenting anterior STEMI.  Significant distal left circumflex disease but the supplied territory is small.  Significant mid RCA stenosis with occluded posterior AV groove with left-to-right collaterals. 2.  Severely reduced LV systolic function with an EF of 20 to 25% with anterior wall akinesis.  LVEDP was 32 mmHg. 3.  Successful PCI and drug-eluting stent placement to the ostial as well as mid LAD.  Recommendations: The patient was given furosemide 40 mg IV after the case.  Continue furosemide 40 mg by mouth twice daily starting tomorrow. I started small dose carvedilol. If renal function remains stable, recommend adding a small dose ARB with plans to transition to [facility] as an outpatient.  In addition, should consider spironolactone if blood pressure allows. Recommend dual antiplatelet therapy for at least 1 year. Recommend staged mid RCA PCI likely on Friday if the patient remains stable.  The rest of his coronary artery disease should be treated medically.   Echo 11/24/2018 IMPRESSIONS    1. The left ventricle has severely reduced systolic function, with an ejection fraction of 25-30%. The cavity size was moderate to severely dilated. Left ventricular diastolic Doppler parameters are consistent with pseudonormalization.  2. There is akinesis of the entire apical myocardium, inferoseptal, anteroseptal and anterior  myocardium.  3. The right ventricle has normal systolic function. The cavity was normal. There is no increase in right ventricular wall thickness. Right ventricular systolic pressure could not be assessed.  4. Left atrial size was moderately dilated.  5. The interatrial septum appears to be lipomatous.  Patient Profile     Evan [name] is a 53 y.o. male with a history  of hypertension, hyperlipidemia, diabetes mellitus, and obesity transferred from [facility] for further evaluation and treatment of non-STEMI.   His EKG shows Q waves across the precordium out of the 4 with biphasic ST segments suggesting an evolving anterior myocardial infarction.   Assessment & Plan    1.  Late presenting anterior STEMI -Cardiac catheterization showed severe three-vessel CAD. Occluded ostial and mid LAD likely due to late presenting anterior STEMI.  Significant distal left circumflex disease but the supplied territory is small.  Significant mid RCA stenosis with occluded posterior AV groove with left-to-right collaterals.  Patient underwent successful PCI with drug-eluting stent placement to ostial and mid LAD.  Plans for staged intervention on RCA [place]. Severely reduced LV systolic function with an EF of 20 to 25% with anterior wall akinesis.  LVEDP was 32 mmHg.  -No recurrent chest pain.  Continue aspirin, Brilinta, statin and beta-blocker.  2.  Acute systolic heart failure/ischemic cardiomyopathy -Echocardiogram showed LV function of 25 to 30% with wall motion abnormality.  Anterior wall akinesis by cardiac cath.  LVEDP 32 mmHg.  Given IV Lasix x1, now on p.o. Lasix 40 mg daily.  Breathing improving.  No significant volume overload on exam. -Increase Coreg to 6.25 mg twice daily.  Add Spironolactone 12.5 mg daily. -Add Entresto tomorrow after cardiac catheterization otherwise losartan >> changed to [facility] as outpatient. -We will arrange LifeVest.  3.  Hypertension -Blood pressure  improving.  Medication changes as above.  4. Non Sustained VT -K3.2.  Will give K. Dur 40 mg and add Spironolactone as above. -Increase beta-blocker as above.  5.HLD - 11/25/2018: Cholesterol 158; HDL 32; LDL Cholesterol 95; Triglycerides 156; VLDL 31 -Continue high intensity statin -Lipid panel and LFT in 6 weeks  6.  Diabetes -Pending hemoglobin A1c.  Management per primary team  7.  Cocaine abuse -Advised complete cessation.  Discussed risk while on beta-blocker.     For questions or updates, please contact [place] Please consult [email or web address] for contact info under        Signed, Leanor Kail, PA  11/25/2018, 9:03 AM    Agree with note by Robbie Lis PA-C  Mr. Woehler was admitted with a non-STEMI.  He had systolic heart failure with an ejection fraction of 25% by 2D echo and segmental wall motion abnormalities.  Because of ongoing chest pain and rising high-sensitivity troponins he underwent diagnostic coronary angiography yesterday via the right radial approach by Dr. Fletcher Anon revealing three-vessel disease, severe LV dysfunction with elevated LVEDP of 30.  He underwent PCI drug-eluting stenting of the proximal and mid LAD restoring antegrade flow.  He did have subtotally occluded distal AV groove circumflex in addition to high-grade mid dominant RCA and an occluded PLA system with left-to-right collaterals.  He was given IV Lasix and was begun on oral diuretics.  He is scheduled for staged RCA intervention by Dr. Velva Harman tomorrow.  He is clinically stable in sinus rhythm/sinus tachycardia with PVCs and evidence of nonsustained ventricular tachycardia.  We will titrate his carvedilol, begin low-dose spironolactone and consider starting Entresto prior to discharge.  He will need a LifeVest in addition prior to discharge given his severe LV dysfunction in the setting of a large anterior STEMI.   Lorretta Harp, M.D., [place], [facility], [place] [address] [place], [place]  [address]  [phone] 11/25/2018 10:32 AM

## 2018-11-25 NOTE — Plan of Care (Signed)
  Problem: Education: Goal: Understanding of cardiac disease, CV risk reduction, and recovery process will improve Outcome: Progressing   Problem: Activity: Goal: Ability to tolerate increased activity will improve Outcome: Progressing   Problem: Cardiac: Goal: Ability to achieve and maintain adequate cardiovascular perfusion will improve Outcome: Progressing   

## 2018-11-25 NOTE — Progress Notes (Signed)
Paperwork submitted for LifeVest.

## 2018-11-26 ENCOUNTER — Encounter (HOSPITAL_COMMUNITY)
Admission: EM | Disposition: A | Discharge status: Home / Self Care | Payer: Self-pay | Source: Other Acute Inpatient Hospital | Attending: Internal Medicine

## 2018-11-26 ENCOUNTER — Encounter (HOSPITAL_COMMUNITY): Payer: Self-pay | Admitting: Cardiovascular Disease

## 2018-11-26 DIAGNOSIS — I519 Heart disease, unspecified: Secondary | ICD-10-CM

## 2018-11-26 HISTORY — PX: CORONARY STENT INTERVENTION: CATH118234

## 2018-11-26 LAB — CBC
HCT: 43.4 % (ref 39.0–52.0)
Hemoglobin: 14.1 g/dL (ref 13.0–17.0)
MCH: 30.3 pg (ref 26.0–34.0)
MCHC: 32.5 g/dL (ref 30.0–36.0)
MCV: 93.1 fL (ref 80.0–100.0)
Platelets: 168 10*3/uL (ref 150–400)
RBC: 4.66 MIL/uL (ref 4.22–5.81)
RDW: 12.7 % (ref 11.5–15.5)
WBC: 9.6 10*3/uL (ref 4.0–10.5)
nRBC: 0 % (ref 0.0–0.2)

## 2018-11-26 LAB — HEMOGLOBIN A1C
Hgb A1c MFr Bld: 6.9 % — ABNORMAL HIGH (ref 4.8–5.6)
Mean Plasma Glucose: 151 mg/dL

## 2018-11-26 LAB — BASIC METABOLIC PANEL
Anion gap: 10 (ref 5–15)
BUN: 18 mg/dL (ref 6–20)
CO2: 24 mmol/L (ref 22–32)
Calcium: 8.2 mg/dL — ABNORMAL LOW (ref 8.9–10.3)
Chloride: 105 mmol/L (ref 98–111)
Creatinine, Ser: 1.03 mg/dL (ref 0.61–1.24)
GFR calc Af Amer: >60 mL/min (ref >60)
GFR calc non Af Amer: >60 mL/min (ref >60)
Glucose, Bld: 117 mg/dL — ABNORMAL HIGH (ref 70–99)
Potassium: 3.8 mmol/L (ref 3.5–5.1)
Sodium: 139 mmol/L (ref 135–145)

## 2018-11-26 LAB — GLUCOSE, CAPILLARY
Glucose-Capillary: 109 mg/dL — ABNORMAL HIGH (ref 70–99)
Glucose-Capillary: 139 mg/dL — ABNORMAL HIGH (ref 70–99)
Glucose-Capillary: 98 mg/dL (ref 70–99)
Glucose-Capillary: 192 mg/dL — ABNORMAL HIGH (ref 70–99)

## 2018-11-26 LAB — MAGNESIUM: Magnesium: 2 mg/dL (ref 1.7–2.4)

## 2018-11-26 SURGERY — CORONARY STENT INTERVENTION
Anesthesia: LOCAL

## 2018-11-26 MED ORDER — SODIUM CHLORIDE 0.9 % IV SOLN
INTRAVENOUS | Status: AC | PRN
  Administered 2018-11-26: 250 mL via INTRAVENOUS

## 2018-11-26 MED ORDER — HEPARIN (PORCINE) IN NACL 1000-0.9 UT/500ML-% IV SOLN
INTRAVENOUS | Status: AC
  Filled 2018-11-26: qty 1000

## 2018-11-26 MED ORDER — MIDAZOLAM HCL 2 MG/2ML IJ SOLN
INTRAMUSCULAR | Status: DC | PRN
  Administered 2018-11-26: 1 mg via INTRAVENOUS

## 2018-11-26 MED ORDER — HEPARIN SODIUM (PORCINE) 1000 UNIT/ML IJ SOLN
INTRAMUSCULAR | Status: AC
  Filled 2018-11-26: qty 1

## 2018-11-26 MED ORDER — LIDOCAINE HCL (PF) 1 % IJ SOLN
INTRAMUSCULAR | Status: AC
  Filled 2018-11-26: qty 30

## 2018-11-26 MED ORDER — FUROSEMIDE 40 MG PO TABS
40.0000 mg | ORAL_TABLET | Freq: Every day | ORAL | Status: DC
  Filled 2018-11-26: qty 1

## 2018-11-26 MED ORDER — SODIUM CHLORIDE 0.9 % IV SOLN: 250.0000 mL | INTRAVENOUS | Status: DC | PRN

## 2018-11-26 MED ORDER — FENTANYL CITRATE (PF) 100 MCG/2ML IJ SOLN
INTRAMUSCULAR | Status: AC
  Filled 2018-11-26: qty 2

## 2018-11-26 MED ORDER — SODIUM CHLORIDE 0.9% FLUSH
3.0000 mL | Freq: Two times a day (BID) | INTRAVENOUS | Status: DC
  Administered 2018-11-26 – 2018-11-28 (×5): 3 mL via INTRAVENOUS

## 2018-11-26 MED ORDER — VERAPAMIL HCL 2.5 MG/ML IV SOLN
INTRAVENOUS | Status: AC
  Filled 2018-11-26: qty 2

## 2018-11-26 MED ORDER — VERAPAMIL HCL 2.5 MG/ML IV SOLN
INTRAVENOUS | Status: DC | PRN
  Administered 2018-11-26: 10:00:00 10 mL via INTRA_ARTERIAL

## 2018-11-26 MED ORDER — FENTANYL CITRATE (PF) 100 MCG/2ML IJ SOLN
INTRAMUSCULAR | Status: DC | PRN
  Administered 2018-11-26: 50 ug via INTRAVENOUS

## 2018-11-26 MED ORDER — SODIUM CHLORIDE 0.9% FLUSH: 3.0000 mL | INTRAVENOUS | Status: DC | PRN

## 2018-11-26 MED ORDER — SODIUM CHLORIDE 0.9 % IV SOLN: INTRAVENOUS | Status: AC

## 2018-11-26 MED ORDER — IOHEXOL 350 MG/ML SOLN
INTRAVENOUS | Status: DC | PRN
  Administered 2018-11-26: 75 mL via INTRA_ARTERIAL

## 2018-11-26 MED ORDER — HEPARIN (PORCINE) IN NACL 1000-0.9 UT/500ML-% IV SOLN
INTRAVENOUS | Status: DC | PRN
  Administered 2018-11-26 (×2): 500 mL

## 2018-11-26 MED ORDER — HEPARIN SODIUM (PORCINE) 1000 UNIT/ML IJ SOLN
INTRAMUSCULAR | Status: DC | PRN
  Administered 2018-11-26: 12000 [IU] via INTRAVENOUS

## 2018-11-26 MED ORDER — POTASSIUM CHLORIDE CRYS ER 20 MEQ PO TBCR
40.0000 meq | EXTENDED_RELEASE_TABLET | Freq: Once | ORAL | Status: AC
  Administered 2018-11-26: 40 meq via ORAL
  Filled 2018-11-26: qty 2

## 2018-11-26 MED ORDER — POTASSIUM CHLORIDE CRYS ER 20 MEQ PO TBCR: 40.0000 meq | EXTENDED_RELEASE_TABLET | Freq: Once | ORAL | Status: DC

## 2018-11-26 MED ORDER — LIDOCAINE HCL (PF) 1 % IJ SOLN
INTRAMUSCULAR | Status: DC | PRN
  Administered 2018-11-26: 2 mL via INTRADERMAL

## 2018-11-26 MED ORDER — MIDAZOLAM HCL 2 MG/2ML IJ SOLN
INTRAMUSCULAR | Status: AC
  Filled 2018-11-26: qty 2

## 2018-11-26 MED ORDER — LOSARTAN POTASSIUM 25 MG PO TABS
25.0000 mg | ORAL_TABLET | Freq: Every day | ORAL | Status: DC
  Administered 2018-11-26 – 2018-11-27 (×2): 25 mg via ORAL
  Filled 2018-11-26 (×2): qty 1

## 2018-11-26 SURGICAL SUPPLY — 19 items
BAG SNAP BAND KOVER 36X36 (MISCELLANEOUS) ×2 IMPLANT
BALLN SAPPHIRE 2.5X12 (BALLOONS) ×2
BALLN SAPPHIRE ~~LOC~~ 3.0X10 (BALLOONS) ×2 IMPLANT
BALLOON SAPPHIRE 2.5X12 (BALLOONS) ×1 IMPLANT
CATH INFINITI 5 FR JL3.5 (CATHETERS) ×2 IMPLANT
CATH LAUNCHER 6FR JR4 (CATHETERS) ×2 IMPLANT
COVER DOME SNAP 22 D (MISCELLANEOUS) ×2 IMPLANT
DEVICE RAD COMP TR BAND LRG (VASCULAR PRODUCTS) ×2 IMPLANT
ELECT DEFIB PAD ADLT CADENCE (PAD) ×2 IMPLANT
GLIDESHEATH SLEND SS 6F .021 (SHEATH) ×2 IMPLANT
GUIDEWIRE INQWIRE 1.5J.035X260 (WIRE) ×1 IMPLANT
INQWIRE 1.5J .035X260CM (WIRE) ×2
KIT ENCORE 26 ADVANTAGE (KITS) ×2 IMPLANT
KIT HEART LEFT (KITS) ×2 IMPLANT
PACK CARDIAC CATHETERIZATION (CUSTOM PROCEDURE TRAY) ×2 IMPLANT
STENT RESOLUTE ONYX 2.75X18 (Permanent Stent) ×2 IMPLANT
TRANSDUCER W/STOPCOCK (MISCELLANEOUS) ×2 IMPLANT
TUBING CIL FLEX 10 FLL-RA (TUBING) ×2 IMPLANT
WIRE RUNTHROUGH .014X180CM (WIRE) ×2 IMPLANT

## 2018-11-26 NOTE — Progress Notes (Addendum)
Progress Note  Patient Name: Evan [name] Date of Encounter: 11/26/2018  Primary Cardiologist: Quay Burow, MD   Subjective   No recurrent chest pain. SOB is improved. He reports good UOP with lasix. Only complaint is sleep interruptions.   Inpatient Medications    Scheduled Meds: . [MAR Hold] allopurinol  300 mg Oral Daily  . [MAR Hold] aspirin EC  81 mg Oral Daily  . [MAR Hold] atorvastatin  80 mg Oral q1800  . [MAR Hold] carvedilol  6.25 mg Oral BID WC  . [MAR Hold] enoxaparin (LOVENOX) injection  40 mg Subcutaneous Q24H  . [MAR Hold] famotidine  20 mg Oral BID  . [MAR Hold] folic acid  1 mg Oral Daily  . [MAR Hold] furosemide  40 mg Oral BID  . [MAR Hold] insulin aspart  0-9 Units Subcutaneous TID AC & HS  . [MAR Hold] insulin detemir  30 Units Subcutaneous QHS  . [MAR Hold] potassium chloride  40 mEq Oral Once  . [MAR Hold] spironolactone  12.5 mg Oral Daily  . [MAR Hold] thiamine  100 mg Oral Daily  . [MAR Hold] ticagrelor  90 mg Oral BID   Continuous Infusions: . sodium chloride 1 mL/kg/hr (11/26/18 0501)   PRN Meds: [MAR Hold] acetaminophen, [MAR Hold] LORazepam, [MAR Hold]  morphine injection, [MAR Hold] nitroGLYCERIN, [MAR Hold] ondansetron (ZOFRAN) IV, [MAR Hold] sodium chloride flush, [MAR Hold] zolpidem   Vital Signs    Vitals:   11/25/18 2000 11/25/18 2043 11/26/18 0544 11/26/18 0859  BP: 112/81 112/81 112/80 118/82  Pulse: 98 98 84 89  Resp:      Temp:  97.8 F (36.6 C) 97.7 F (36.5 C)   TempSrc:  Oral Oral   SpO2:  96% 98%   Weight:   124 kg   Height:        Intake/Output Summary (Last 24 hours) at 11/26/2018 0902 Last data filed at 11/25/2018 1900 Gross per 24 hour  Intake 480 ml  Output -  Net 480 ml   Filed Weights   11/24/18 0514 11/25/18 0636 11/26/18 0544  Weight: 127.6 kg 128.5 kg 124 kg    Telemetry    Sinus rhythm with a couple episodes of NSVT (longest ~12 seconds) - Personally Reviewed  Physical Exam   GEN: Sitting  on the edge of the hospital bed in no acute distress.   Neck: No JVD, no carotid bruits Cardiac: RRR, no murmurs, rubs, or gallops.  Respiratory: Clear to auscultation bilaterally, no wheezes/ rales/ rhonchi GI: NABS, Soft, nontender, non-distended  MS: No edema; No deformity. Neuro:  Nonfocal, moving all extremities spontaneously Psych: Normal affect   Labs    Chemistry Recent Labs  Lab 11/24/18 0613 11/24/18 1101 11/24/18 1500 11/25/18 0431 11/26/18 0444  NA 142 142  --  138 139  K 3.6 3.3*  --  3.2* 3.8  CL 108 109  --  105 105  CO2 22 22  --  21* 24  GLUCOSE 143* 116*  --  99 117*  BUN 13 13  --  14 18  CREATININE 1.06 0.92 1.07 0.97 1.03  CALCIUM 9.3 8.7*  --  8.2* 8.2*  PROT 7.2 6.4*  --   --   --   ALBUMIN 3.9 3.6  --   --   --   AST 84* 109*  --   --   --   ALT 22 26  --   --   --   ALKPHOS 65  57  --   --   --   BILITOT 0.8 1.0  --   --   --   GFRNONAA >60 >60 >60 >60 >60  GFRAA >60 >60 >60 >60 >60  ANIONGAP 12 11  --  12 10     Hematology Recent Labs  Lab 11/24/18 1500 11/25/18 0431 11/26/18 0444  WBC 15.3* 11.4* 9.6  RBC 5.39 4.81 4.66  HGB 16.6 14.7 14.1  HCT 50.3 44.8 43.4  MCV 93.3 93.1 93.1  MCH 30.8 30.6 30.3  MCHC 33.0 32.8 32.5  RDW 12.8 12.8 12.7  PLT 253 180 168    Cardiac EnzymesNo results for input(s): TROPONINI in the last 168 hours. No results for input(s): TROPIPOC in the last 168 hours.   BNP Recent Labs  Lab 11/24/18 1101  BNP 624.9*     DDimer No results for input(s): DDIMER in the last 168 hours.   Radiology    Portable Chest X-ray 1 View  Result Date: 11/24/2018 CLINICAL DATA:  Acute coronary syndrome. EXAM: PORTABLE CHEST 1 VIEW COMPARISON:  Radiograph of November 24, 2018. FINDINGS: The heart size and mediastinal contours are within normal limits. Both lungs are clear. No pneumothorax or pleural effusion is noted. The visualized skeletal structures are unremarkable. IMPRESSION: No active disease. Electronically Signed    By: Marijo Conception M.D.   On: 11/24/2018 09:14    Cardiac Studies   CORONARY STENT INTERVENTION   11/25/2018  LEFT HEART CATH AND CORONARY ANGIOGRAPHY  Conclusion    Prox RCA to Mid RCA lesion is 95% stenosed.  Dist RCA lesion is 30% stenosed.  RPAV lesion is 100% stenosed.  Dist Cx lesion is 99% stenosed.  There is severe left ventricular systolic dysfunction.  LV end diastolic pressure is severely elevated.  The left ventricular ejection fraction is less than 25% by visual estimate.  Ost LAD to Prox LAD lesion is 100% stenosed.  Post intervention, there is a 0% residual stenosis.  A drug-eluting stent was successfully placed using a STENT RESOLUTE ONYX 3.5X22.  A drug-eluting stent was successfully placed using a [place] [id number].  Mid LAD-1 lesion is 30% stenosed.  Mid LAD-2 lesion is 100% stenosed.  Post intervention, there is a 0% residual stenosis.  1. Severe three-vessel coronary artery disease. Occluded ostial and mid LAD likely due to late presenting anterior STEMI. Significant distal left circumflex disease but the supplied territory is small. Significant mid RCA stenosis with occluded posterior AV groove with left-to-right collaterals. 2. Severely reduced LV systolic function with an EF of 20 to 25% with anterior wall akinesis. LVEDP was 32 mmHg. 3. Successful PCI and drug-eluting stent placement to the ostial as well as mid LAD.  Recommendations: The patient was given furosemide 40 mg IV after the case. Continue furosemide 40 mg by mouth twice daily starting tomorrow. I started small dose carvedilol. If renal function remains stable, recommend adding a small dose ARB with plans to transition to [facility] as an outpatient. In addition, should consider spironolactone if blood pressure allows. Recommend dual antiplatelet therapy for at least 1 year. Recommend staged mid RCA PCI likely on Friday if the patient remains stable. The rest of  his coronary artery disease should be treated medically.   Echo 11/24/2018 IMPRESSIONS   1. The left ventricle has severely reduced systolic function, with an ejection fraction of 25-30%. The cavity size was moderate to severely dilated. Left ventricular diastolic Doppler parameters are consistent with pseudonormalization. 2. There is  akinesis of the entire apical myocardium, inferoseptal, anteroseptal and anterior myocardium. 3. The right ventricle has normal systolic function. The cavity was normal. There is no increase in right ventricular wall thickness. Right ventricular systolic pressure could not be assessed. 4. Left atrial size was moderately dilated. 5. The interatrial septum appears to be lipomatous.   Patient Profile     Bayan W Martinis a 53 y.o.malewith a history of hypertension, hyperlipidemia, diabetes mellitus, and obesity transferred from [facility] for further evaluation and treatment of non-STEMI.   His EKG shows Q waves across the precordium out of the 4 with biphasic ST segments suggesting an evolving anterior myocardial infarction.   Assessment & Plan    1. Late presenting anterior STEMI: patient presented to [facility] 11/23/2018 with chest pain x1-2 weeks. [place] [address]. He underwent LHC 11/24/2018 which showed severe 3 vessel CAD s/p PCI/DES to ostial and mLAD, recommended for staged intervention to RCA (planned for today). No recurrent chest pain - Continue aspirin, brilinta, statin, and BBlocker - Plan for staged intervention today.  2. Acute systolic CHF/ischemic cardiomyopathy: Echo showed EF 25-30%. Patient was given IV lasix x1 dose and transitioned to po 40mg  daily. UOP with incomplete documentation. Weight 283lbs>273lbs. No significant volume overload on exam. Patient was started on BBlocker and spironolactone. LifeVest also ordered for primary prevention.   - Continue carvedilol, spironolactone, and lasix - Will plan to  initiate entresto following staged intervention today - may need to decrease carvedilol and/or lasix dose to allow room in BP.   3. HTN: BP improved on carvedilol and spironolactone - Continue current regimen - Will plan to initiate entresto prior to discharge if BP will allow  4. HLD: LDL 95 this admission.  - Continue atorvastatin  5. DM type 2: A1C 6.9; at goal of <7. - Continue jardiance and insulin per primary team  6. NSVT: patient denies palpitations. K improved to 3.8 today - Continue aggressive electrolyte repletion to maintain K>4, Mg>2   For questions or updates, please contact [place] Please consult [email or web address] for contact info under Cardiology/STEMI.      Signed, Abigail Butts, PA-C  11/26/2018, 9:02 AM   [phone]  Agree with assessment and plan by Roby Lofts PA-C  Mr. Schroeter is for staged RCA intervention this morning with Dr. Fletcher Anon.  He has severe LV dysfunction on appropriate medications.  We will plan to fit him with a LifeVest prior to discharge home over the weekend.  He is clinically stable denying chest pain or shortness of breath.  Lorretta Harp, M.D., [place], [facility], [place] [address] [place], [place]  [address]  [phone] 11/26/2018 9:33 AM

## 2018-11-26 NOTE — H&P (View-Only) (Signed)
Progress Note  Patient Name: Evan [name] Date of Encounter: 11/26/2018  Primary Cardiologist: Quay Burow, MD   Subjective   No recurrent chest pain. SOB is improved. He reports good UOP with lasix. Only complaint is sleep interruptions.   Inpatient Medications    Scheduled Meds: . [MAR Hold] allopurinol  300 mg Oral Daily  . [MAR Hold] aspirin EC  81 mg Oral Daily  . [MAR Hold] atorvastatin  80 mg Oral q1800  . [MAR Hold] carvedilol  6.25 mg Oral BID WC  . [MAR Hold] enoxaparin (LOVENOX) injection  40 mg Subcutaneous Q24H  . [MAR Hold] famotidine  20 mg Oral BID  . [MAR Hold] folic acid  1 mg Oral Daily  . [MAR Hold] furosemide  40 mg Oral BID  . [MAR Hold] insulin aspart  0-9 Units Subcutaneous TID AC & HS  . [MAR Hold] insulin detemir  30 Units Subcutaneous QHS  . [MAR Hold] potassium chloride  40 mEq Oral Once  . [MAR Hold] spironolactone  12.5 mg Oral Daily  . [MAR Hold] thiamine  100 mg Oral Daily  . [MAR Hold] ticagrelor  90 mg Oral BID   Continuous Infusions: . sodium chloride 1 mL/kg/hr (11/26/18 0501)   PRN Meds: [MAR Hold] acetaminophen, [MAR Hold] LORazepam, [MAR Hold]  morphine injection, [MAR Hold] nitroGLYCERIN, [MAR Hold] ondansetron (ZOFRAN) IV, [MAR Hold] sodium chloride flush, [MAR Hold] zolpidem   Vital Signs    Vitals:   11/25/18 2000 11/25/18 2043 11/26/18 0544 11/26/18 0859  BP: 112/81 112/81 112/80 118/82  Pulse: 98 98 84 89  Resp:      Temp:  97.8 F (36.6 C) 97.7 F (36.5 C)   TempSrc:  Oral Oral   SpO2:  96% 98%   Weight:   124 kg   Height:        Intake/Output Summary (Last 24 hours) at 11/26/2018 0902 Last data filed at 11/25/2018 1900 Gross per 24 hour  Intake 480 ml  Output -  Net 480 ml   Filed Weights   11/24/18 0514 11/25/18 0636 11/26/18 0544  Weight: 127.6 kg 128.5 kg 124 kg    Telemetry    Sinus rhythm with a couple episodes of NSVT (longest ~12 seconds) - Personally Reviewed  Physical Exam   GEN: Sitting  on the edge of the hospital bed in no acute distress.   Neck: No JVD, no carotid bruits Cardiac: RRR, no murmurs, rubs, or gallops.  Respiratory: Clear to auscultation bilaterally, no wheezes/ rales/ rhonchi GI: NABS, Soft, nontender, non-distended  MS: No edema; No deformity. Neuro:  Nonfocal, moving all extremities spontaneously Psych: Normal affect   Labs    Chemistry Recent Labs  Lab 11/24/18 0613 11/24/18 1101 11/24/18 1500 11/25/18 0431 11/26/18 0444  NA 142 142  --  138 139  K 3.6 3.3*  --  3.2* 3.8  CL 108 109  --  105 105  CO2 22 22  --  21* 24  GLUCOSE 143* 116*  --  99 117*  BUN 13 13  --  14 18  CREATININE 1.06 0.92 1.07 0.97 1.03  CALCIUM 9.3 8.7*  --  8.2* 8.2*  PROT 7.2 6.4*  --   --   --   ALBUMIN 3.9 3.6  --   --   --   AST 84* 109*  --   --   --   ALT 22 26  --   --   --   ALKPHOS 65  57  --   --   --   BILITOT 0.8 1.0  --   --   --   GFRNONAA >60 >60 >60 >60 >60  GFRAA >60 >60 >60 >60 >60  ANIONGAP 12 11  --  12 10     Hematology Recent Labs  Lab 11/24/18 1500 11/25/18 0431 11/26/18 0444  WBC 15.3* 11.4* 9.6  RBC 5.39 4.81 4.66  HGB 16.6 14.7 14.1  HCT 50.3 44.8 43.4  MCV 93.3 93.1 93.1  MCH 30.8 30.6 30.3  MCHC 33.0 32.8 32.5  RDW 12.8 12.8 12.7  PLT 253 180 168    Cardiac EnzymesNo results for input(s): TROPONINI in the last 168 hours. No results for input(s): TROPIPOC in the last 168 hours.   BNP Recent Labs  Lab 11/24/18 1101  BNP 624.9*     DDimer No results for input(s): DDIMER in the last 168 hours.   Radiology    Portable Chest X-ray 1 View  Result Date: 11/24/2018 CLINICAL DATA:  Acute coronary syndrome. EXAM: PORTABLE CHEST 1 VIEW COMPARISON:  Radiograph of November 24, 2018. FINDINGS: The heart size and mediastinal contours are within normal limits. Both lungs are clear. No pneumothorax or pleural effusion is noted. The visualized skeletal structures are unremarkable. IMPRESSION: No active disease. Electronically Signed    By: Marijo Conception M.D.   On: 11/24/2018 09:14    Cardiac Studies   CORONARY STENT INTERVENTION   11/25/2018  LEFT HEART CATH AND CORONARY ANGIOGRAPHY  Conclusion    Prox RCA to Mid RCA lesion is 95% stenosed.  Dist RCA lesion is 30% stenosed.  RPAV lesion is 100% stenosed.  Dist Cx lesion is 99% stenosed.  There is severe left ventricular systolic dysfunction.  LV end diastolic pressure is severely elevated.  The left ventricular ejection fraction is less than 25% by visual estimate.  Ost LAD to Prox LAD lesion is 100% stenosed.  Post intervention, there is a 0% residual stenosis.  A drug-eluting stent was successfully placed using a STENT RESOLUTE ONYX 3.5X22.  A drug-eluting stent was successfully placed using a [place] [id number].  Mid LAD-1 lesion is 30% stenosed.  Mid LAD-2 lesion is 100% stenosed.  Post intervention, there is a 0% residual stenosis.  1. Severe three-vessel coronary artery disease. Occluded ostial and mid LAD likely due to late presenting anterior STEMI. Significant distal left circumflex disease but the supplied territory is small. Significant mid RCA stenosis with occluded posterior AV groove with left-to-right collaterals. 2. Severely reduced LV systolic function with an EF of 20 to 25% with anterior wall akinesis. LVEDP was 32 mmHg. 3. Successful PCI and drug-eluting stent placement to the ostial as well as mid LAD.  Recommendations: The patient was given furosemide 40 mg IV after the case. Continue furosemide 40 mg by mouth twice daily starting tomorrow. I started small dose carvedilol. If renal function remains stable, recommend adding a small dose ARB with plans to transition to [facility] as an outpatient. In addition, should consider spironolactone if blood pressure allows. Recommend dual antiplatelet therapy for at least 1 year. Recommend staged mid RCA PCI likely on Friday if the patient remains stable. The rest of  his coronary artery disease should be treated medically.   Echo 11/24/2018 IMPRESSIONS   1. The left ventricle has severely reduced systolic function, with an ejection fraction of 25-30%. The cavity size was moderate to severely dilated. Left ventricular diastolic Doppler parameters are consistent with pseudonormalization. 2. There is  akinesis of the entire apical myocardium, inferoseptal, anteroseptal and anterior myocardium. 3. The right ventricle has normal systolic function. The cavity was normal. There is no increase in right ventricular wall thickness. Right ventricular systolic pressure could not be assessed. 4. Left atrial size was moderately dilated. 5. The interatrial septum appears to be lipomatous.   Patient Profile     Evan [name] a 53 y.o.malewith a history of hypertension, hyperlipidemia, diabetes mellitus, and obesity transferred from [facility] for further evaluation and treatment of non-STEMI.   His EKG shows Q waves across the precordium out of the 4 with biphasic ST segments suggesting an evolving anterior myocardial infarction.   Assessment & Plan    1. Late presenting anterior STEMI: patient presented to [facility] 11/23/2018 with chest pain x1-2 weeks. [place]rop [place] at [address]. He underwent LHC 11/24/2018 which showed severe 3 vessel CAD s/p PCI/DES to ostial and mLAD, recommended for staged intervention to RCA (planned for today). No recurrent chest pain - Continue aspirin, brilinta, statin, and BBlocker - Plan for staged intervention today.  2. Acute systolic CHF/ischemic cardiomyopathy: Echo showed EF 25-30%. Patient was given IV lasix x1 dose and transitioned to po 40mg  daily. UOP with incomplete documentation. Weight 283lbs>273lbs. No significant volume overload on exam. Patient was started on BBlocker and spironolactone. LifeVest also ordered for primary prevention.   - Continue carvedilol, spironolactone, and lasix - Will plan to  initiate entresto following staged intervention today - may need to decrease carvedilol and/or lasix dose to allow room in BP.   3. HTN: BP improved on carvedilol and spironolactone - Continue current regimen - Will plan to initiate entresto prior to discharge if BP will allow  4. HLD: LDL 95 this admission.  - Continue atorvastatin  5. DM type 2: A1C 6.9; at goal of <7. - Continue jardiance and insulin per primary team  6. NSVT: patient denies palpitations. K improved to 3.8 today - Continue aggressive electrolyte repletion to maintain K>4, Mg>2   For questions or updates, please contact [place] Please consult [email or web address] for contact info under Cardiology/STEMI.      Signed, Evan Butts, PA-C  11/26/2018, 9:02 AM   [phone]  Agree with assessment and plan by Evan Lofts PA-C  Mr. Evan [name] is for staged RCA intervention this morning with Dr. Fletcher [name].  He has severe LV dysfunction on appropriate medications.  We will plan to fit him with a LifeVest prior to discharge home over the weekend.  He is clinically stable denying chest pain or shortness of breath.  Evan [name], M.D., [place], [facility], [place] [address] [place], [place]  [address]  [phone] 11/26/2018 9:33 AM

## 2018-11-26 NOTE — Progress Notes (Signed)
PROGRESS NOTE    Evan [name]  ZOX:[id number] DOB: 1965-06-29 DOA: 11/24/2018 PCP: Dettinger, Fransisca Kaufmann, MD   Brief Narrative:  HPI On 11/24/2018 by Dr. Dia Crawford  Evan [name] is a 53 y.o. WM PMHx diabetes type 2 uncontrolled with complication, HTN, HLD, gout, morbid obesity.  States over the last week has had multiple episodes of chest pain centered substernal/left chest with radiation down left arm and into left neck most episodes have been secondary to exertion and relieved with rest.  Positive S OB, negative diaphoresis, positive nausea, negative vomiting.  Currently negative chest pain after NTG and morphine.  Interim history  Patient admitted for chest pain and noted to have STEMI and had cath with 2 stents placed. Pending cath today.   Assessment & Plan   Late presenting Anterior STEMI -Patient presented with positive troponin.  EKG suggestive of anterior lateral MI -Echocardiogram EF of 25 to 30%.  LV diastolic parameters consistent with pseudonormalization.  Akinesis of the entire apical myocardium, inferior septal, anterior septal and anterior myocardium. -Cardiology consulted and appreciated -Left heart cath: Severe three-vessel coronary artery disease.  Occluded ostial and mid LAD.  Significant distal left circumflex disease.  Mid RCA stenosis.  EF of 20 to 25%.  Patient did receive 2 drug-eluting stents-ostial and mid LAD.  Commendations were to start IV Lasix followed by oral Lasix and Coreg.  Consider small dose ARB spironolactone. Dual antiplatelet therapy for 1 year.  Recommend staged mid RCA PCI on Friday, 11/26/2018.  -lipid panel: TC 158, LDL 95, TG 156, HDL 32 -Currently, no chest pain. -Continue aspirin, Brilinta, statin, Coreg -pending cath today   Acute systolic CHF/ischemic cardiomyopathy -Echocardiogram and cardiac catheterization as above -Patient was given IV Lasix x1 dose and now on oral Lasix -Breathing has improved -Currently euvolemic on exam -Pending  further cardiac recommendations regarding spironolactone and ARB  Essential hypertension -BP stable, continue Coreg and oral Lasix  Alcohol and substance abuse -Continue CIWA protocol -Drug abuse positive for opiates and cocaine -Alcohol level was negative -Discussed cessation  Diabetes mellitus, type II -hemoglobin A1c 7 -Continue insulin sliding scale and CBG monitoring  Hyperlipidemia -Lipid panel as above, continue statin -Patient will need repeat LFTs and lipid panel in 6 weeks  Nonsustained VT -Potassium 3.8, will give additional supplementation  -magnesium 1.8 -Currently asymptomatic  Obesity -BMI 34.03  DVT Prophylaxis  lovenox  Code Status: Full  Family Communication: None at bedside  Disposition Plan: Admitted. Pending Cath today 7/10. Pending further cardiology recommendations. Suspect home when stable.   Consultants Cardiology  Procedures  Echocardiogram Cardiac catheterization  Antibiotics   Anti-infectives (From admission, onward)   None      Subjective:   Evan [name] seen and examined today.  Complains about not being able to sleep in the hospital.  Denies current chest pain or shortness of breath.  Denies abdominal pain, nausea or vomiting, diarrhea constipation, dizziness or headache. Objective:   Vitals:   11/25/18 1247 11/25/18 2000 11/25/18 2043 11/26/18 0544  BP: (!) 126/95 112/81 112/81 112/80  Pulse: 91 98 98 84  Resp: 16     Temp: 97.6 F (36.4 C)  97.8 F (36.6 C) 97.7 F (36.5 C)  TempSrc: Oral  Oral Oral  SpO2: 97%  96% 98%  Weight:    124 kg  Height:        Intake/Output Summary (Last 24 hours) at 11/26/2018 0850 Last data filed at 11/25/2018 1900 Gross per 24 hour  Intake 480  ml  Output -  Net 480 ml   Filed Weights   11/24/18 0514 11/25/18 0636 11/26/18 0544  Weight: 127.6 kg 128.5 kg 124 kg   Exam  General: Well developed, well nourished, NAD, appears stated age  83: NCAT, mucous membranes moist.    Neck: Supple  Cardiovascular: S1 S2 auscultated, no murmur, RRR  Respiratory: Clear to auscultation bilaterally with equal chest rise  Abdomen: Soft, nontender, nondistended, + bowel sounds  Extremities: warm dry without cyanosis clubbing or edema  Neuro: AAOx3, nonfocal  Psych: Normal affect and demeanor  Data Reviewed: I have personally reviewed following labs and imaging studies  CBC: Recent Labs  Lab 11/24/18 0613 11/24/18 1101 11/24/18 1500 11/25/18 0431 11/26/18 0444  WBC 11.6* 12.7* 15.3* 11.4* 9.6  NEUTROABS 8.7* 10.0*  --   --   --   HGB 15.9 14.7 16.6 14.7 14.1  HCT 47.8 44.6 50.3 44.8 43.4  MCV 92.6 92.5 93.3 93.1 93.1  PLT 239 218 253 180 950   Basic Metabolic Panel: Recent Labs  Lab 11/24/18 0613 11/24/18 1101 11/24/18 1500 11/25/18 0431 11/26/18 0444  NA 142 142  --  138 139  K 3.6 3.3*  --  3.2* 3.8  CL 108 109  --  105 105  CO2 22 22  --  21* 24  GLUCOSE 143* 116*  --  99 117*  BUN 13 13  --  14 18  CREATININE 1.06 0.92 1.07 0.97 1.03  CALCIUM 9.3 8.7*  --  8.2* 8.2*  MG  --  1.9  --   --   --    GFR: Estimated Creatinine Clearance: 120.1 mL/min (by C-G formula based on SCr of 1.03 mg/dL). Liver Function Tests: Recent Labs  Lab 11/24/18 0613 11/24/18 1101  AST 84* 109*  ALT 22 26  ALKPHOS 65 57  BILITOT 0.8 1.0  PROT 7.2 6.4*  ALBUMIN 3.9 3.6   No results for input(s): LIPASE, AMYLASE in the last 168 hours. No results for input(s): AMMONIA in the last 168 hours. Coagulation Profile: Recent Labs  Lab 11/24/18 0613  INR 1.1   Cardiac Enzymes: No results for input(s): CKTOTAL, CKMB, CKMBINDEX, TROPONINI in the last 168 hours. BNP (last 3 results) No results for input(s): PROBNP in the last 8760 hours. HbA1C: Recent Labs    11/24/18 1101 11/25/18 0431  HGBA1C 7.0* 6.9*   CBG: Recent Labs  Lab 11/25/18 0716 11/25/18 1217 11/25/18 1525 11/25/18 2120 11/26/18 0734  GLUCAP 89 99 146* 157* 109*   Lipid Profile: Recent  Labs    11/24/18 1102 11/25/18 0431  CHOL 181 158  HDL 28* 32*  LDLCALC 113* 95  TRIG 199* 156*  CHOLHDL 6.5 4.9   Thyroid Function Tests: Recent Labs    11/24/18 1057  TSH 1.022   Anemia Panel: No results for input(s): VITAMINB12, FOLATE, FERRITIN, TIBC, IRON, RETICCTPCT in the last 72 hours. Urine analysis: No results found for: COLORURINE, APPEARANCEUR, LABSPEC, PHURINE, GLUCOSEU, HGBUR, BILIRUBINUR, KETONESUR, PROTEINUR, UROBILINOGEN, NITRITE, LEUKOCYTESUR Sepsis Labs: @LABRCNTIP (procalcitonin:4,lacticidven:4)  ) Recent Results (from the past 240 hour(s))  SARS Coronavirus 2 (CEPHEID - Performed in [place] hospital lab), Hosp Order     Status: None   Collection Time: 11/24/18  8:27 AM   Specimen: Nasopharyngeal Swab  Result Value Ref Range Status   SARS Coronavirus 2 NEGATIVE NEGATIVE Final    Comment: (NOTE) If result is NEGATIVE SARS-CoV-2 target nucleic acids are NOT DETECTED. The SARS-CoV-2 RNA is generally detectable in  upper and lower  respiratory specimens during the acute phase of infection. The lowest  concentration of SARS-CoV-2 viral copies this assay can detect is 250  copies / mL. A negative result does not preclude SARS-CoV-2 infection  and should not be used as the sole basis for treatment or other  patient management decisions.  A negative result may occur with  improper specimen collection / handling, submission of specimen other  than nasopharyngeal swab, presence of viral mutation(s) within the  areas targeted by this assay, and inadequate number of viral copies  (<250 copies / mL). A negative result must be combined with clinical  observations, patient history, and epidemiological information. If result is POSITIVE SARS-CoV-2 target nucleic acids are DETECTED. The SARS-CoV-2 RNA is generally detectable in upper and lower  respiratory specimens dur ing the acute phase of infection.  Positive  results are indicative of active infection with  SARS-CoV-2.  Clinical  correlation with patient history and other diagnostic information is  necessary to determine patient infection status.  Positive results do  not rule out bacterial infection or co-infection with other viruses. If result is PRESUMPTIVE POSTIVE SARS-CoV-2 nucleic acids MAY BE PRESENT.   A presumptive positive result was obtained on the submitted specimen  and confirmed on repeat testing.  While 2019 novel coronavirus  (SARS-CoV-2) nucleic acids may be present in the submitted sample  additional confirmatory testing may be necessary for epidemiological  and / or clinical management purposes  to differentiate between  SARS-CoV-2 and other Sarbecovirus currently known to infect humans.  If clinically indicated additional testing with an alternate test  methodology [phone]) is advised. The SARS-CoV-2 RNA is generally  detectable in upper and lower respiratory sp ecimens during the acute  phase of infection. The expected result is Negative. Fact Sheet for Patients:  StrictlyIdeas.no Fact Sheet for Healthcare Providers: BankingDealers.co.za This test is not yet approved or cleared by the [place] FDA and has been authorized for detection and/or diagnosis of SARS-CoV-2 by FDA under an Emergency Use Authorization (EUA).  This EUA will remain in effect (meaning this test can be used) for the duration of the COVID-19 declaration under Section 564(b)(1) of the Act, 21 U.S.C. section 360bbb-3(b)(1), unless the authorization is terminated or revoked sooner. Performed at [facility], [place] [address], [place], [place] [address]       Radiology Studies: Portable Chest X-ray 1 View  Result Date: 11/24/2018 CLINICAL DATA:  Acute coronary syndrome. EXAM: PORTABLE CHEST 1 VIEW COMPARISON:  Radiograph of November 24, 2018. FINDINGS: The heart size and mediastinal contours are within normal limits. Both lungs are clear. No  pneumothorax or pleural effusion is noted. The visualized skeletal structures are unremarkable. IMPRESSION: No active disease. Electronically Signed   By: Marijo Conception M.D.   On: 11/24/2018 09:14     Scheduled Meds: . allopurinol  300 mg Oral Daily  . aspirin EC  81 mg Oral Daily  . atorvastatin  80 mg Oral q1800  . carvedilol  6.25 mg Oral BID WC  . enoxaparin (LOVENOX) injection  40 mg Subcutaneous Q24H  . famotidine  20 mg Oral BID  . folic acid  1 mg Oral Daily  . furosemide  40 mg Oral BID  . insulin aspart  0-9 Units Subcutaneous TID AC & HS  . insulin detemir  30 Units Subcutaneous QHS  . spironolactone  12.5 mg Oral Daily  . thiamine  100 mg Oral Daily  . ticagrelor  90  mg Oral BID   Continuous Infusions: . sodium chloride 1 mL/kg/hr (11/26/18 0501)     LOS: 2 days   Time Spent in minutes   30 minutes  Catlin Aycock D.O. on 11/26/2018 at 8:50 AM  Between 7am to 7pm - Please see pager noted on [email or web address]  After 7pm go to [email or web address]  And look for the night coverage person covering for me after hours  [facility] Office  [phone]

## 2018-11-26 NOTE — Care Management (Signed)
1521 11-26-18 Pt to be fit for Life Vest today after 1700. No further needs from CM at this time. Bethena Roys, [place], [place]

## 2018-11-26 NOTE — Care Management (Signed)
11-26-18 CM did receive consult for medication assistance. Patient has Medicaid- and co pay cost should not be more than $3.00. No further needs from CM at this time. Bethena Roys ,RN,BSN Case Manager, [phone]

## 2018-11-26 NOTE — Interval H&P Note (Signed)
Cath Lab Visit (complete for each Cath Lab visit)  Clinical Evaluation Leading to the Procedure:   ACS: Yes.    Non-ACS:  n/a      History and Physical Interval Note:  11/26/2018 10:23 AM  Evan [name]  has presented today for surgery, with the diagnosis of CAD.  The various methods of treatment have been discussed with the patient and family. After consideration of risks, benefits and other options for treatment, the patient has consented to  Procedure(s): CORONARY STENT INTERVENTION (N/A) as a surgical intervention.  The patient's history has been reviewed, patient examined, no change in status, stable for surgery.  I have reviewed the patient's chart and labs.  Questions were answered to the patient's satisfaction.     Kathlyn [place]

## 2018-11-27 ENCOUNTER — Inpatient Hospital Stay (HOSPITAL_COMMUNITY): Payer: Medicaid Other

## 2018-11-27 DIAGNOSIS — I255 Ischemic cardiomyopathy: Secondary | ICD-10-CM

## 2018-11-27 LAB — GLUCOSE, CAPILLARY
Glucose-Capillary: 121 mg/dL — ABNORMAL HIGH (ref 70–99)
Glucose-Capillary: 125 mg/dL — ABNORMAL HIGH (ref 70–99)
Glucose-Capillary: 114 mg/dL — ABNORMAL HIGH (ref 70–99)
Glucose-Capillary: 175 mg/dL — ABNORMAL HIGH (ref 70–99)

## 2018-11-27 LAB — CBC
HCT: 45.8 % (ref 39.0–52.0)
Hemoglobin: 15 g/dL (ref 13.0–17.0)
MCH: 30.6 pg (ref 26.0–34.0)
MCHC: 32.8 g/dL (ref 30.0–36.0)
MCV: 93.5 fL (ref 80.0–100.0)
Platelets: 195 10*3/uL (ref 150–400)
RBC: 4.9 MIL/uL (ref 4.22–5.81)
RDW: 12.6 % (ref 11.5–15.5)
WBC: 11.2 10*3/uL — ABNORMAL HIGH (ref 4.0–10.5)
nRBC: 0 % (ref 0.0–0.2)

## 2018-11-27 LAB — BASIC METABOLIC PANEL
Anion gap: 12 (ref 5–15)
BUN: 19 mg/dL (ref 6–20)
CO2: 22 mmol/L (ref 22–32)
Calcium: 8.9 mg/dL (ref 8.9–10.3)
Chloride: 106 mmol/L (ref 98–111)
Creatinine, Ser: 0.99 mg/dL (ref 0.61–1.24)
GFR calc Af Amer: >60 mL/min (ref >60)
GFR calc non Af Amer: >60 mL/min (ref >60)
Glucose, Bld: 137 mg/dL — ABNORMAL HIGH (ref 70–99)
Potassium: 3.9 mmol/L (ref 3.5–5.1)
Sodium: 140 mmol/L (ref 135–145)

## 2018-11-27 MED ORDER — CARVEDILOL 12.5 MG PO TABS
12.5000 mg | ORAL_TABLET | Freq: Two times a day (BID) | ORAL | Status: DC
  Administered 2018-11-27 – 2018-11-28 (×2): 12.5 mg via ORAL
  Filled 2018-11-27 (×2): qty 1

## 2018-11-27 MED ORDER — FUROSEMIDE 40 MG PO TABS
40.0000 mg | ORAL_TABLET | Freq: Every day | ORAL | Status: DC
  Administered 2018-11-28: 40 mg via ORAL
  Filled 2018-11-27: qty 1

## 2018-11-27 MED ORDER — LOSARTAN POTASSIUM 25 MG PO TABS
25.0000 mg | ORAL_TABLET | Freq: Every day | ORAL | Status: DC
  Administered 2018-11-27 – 2018-11-28 (×2): 25 mg via ORAL
  Filled 2018-11-27 (×2): qty 1

## 2018-11-27 MED ORDER — FUROSEMIDE 20 MG PO TABS
20.0000 mg | ORAL_TABLET | Freq: Every day | ORAL | Status: DC
  Administered 2018-11-27: 20 mg via ORAL
  Filled 2018-11-27 (×2): qty 1

## 2018-11-27 MED ORDER — FUROSEMIDE 10 MG/ML IJ SOLN
40.0000 mg | Freq: Once | INTRAMUSCULAR | Status: AC
  Administered 2018-11-27: 40 mg via INTRAVENOUS
  Filled 2018-11-27: qty 4

## 2018-11-27 MED ORDER — FLUTICASONE PROPIONATE 50 MCG/ACT NA SUSP
1.0000 | Freq: Two times a day (BID) | NASAL | Status: DC
  Administered 2018-11-27 – 2018-11-28 (×3): 1 via NASAL
  Filled 2018-11-27: qty 16

## 2018-11-27 NOTE — Progress Notes (Signed)
Progress Note  Patient Name: Evan [name] Date of Encounter: 11/27/2018  Primary Cardiologist: Quay Burow, MD   Subjective   No recurrent chest pain. SOB last PM . He reports good UOP with lasix. Only complaint is sleep interruptions.   Inpatient Medications    Scheduled Meds: . aspirin EC  81 mg Oral Daily  . atorvastatin  80 mg Oral q1800  . carvedilol  6.25 mg Oral BID WC  . enoxaparin (LOVENOX) injection  40 mg Subcutaneous Q24H  . famotidine  20 mg Oral BID  . fluticasone  1 spray Each Nare BID  . folic acid  1 mg Oral Daily  . [START ON 11/28/2018] furosemide  40 mg Oral Daily  . insulin aspart  0-9 Units Subcutaneous TID AC & HS  . insulin detemir  30 Units Subcutaneous QHS  . losartan  25 mg Oral Daily  . sodium chloride flush  3 mL Intravenous Q12H  . spironolactone  12.5 mg Oral Daily  . thiamine  100 mg Oral Daily  . ticagrelor  90 mg Oral BID   Continuous Infusions: . sodium chloride     PRN Meds: sodium chloride, acetaminophen, LORazepam, morphine injection, nitroGLYCERIN, ondansetron (ZOFRAN) IV, sodium chloride flush, sodium chloride flush, zolpidem   Vital Signs    Vitals:   11/26/18 1703 11/26/18 2037 11/27/18 0608 11/27/18 0837  BP: 124/87 105/62 107/73 106/67  Pulse: 81 86 81 90  Resp:      Temp:  97.7 F (36.5 C) (!) 97.4 F (36.3 C)   TempSrc:  Oral Oral   SpO2: 98% 96% 100%   Weight:   125.4 kg   Height:        Intake/Output Summary (Last 24 hours) at 11/27/2018 0941 Last data filed at 11/27/2018 0910 Gross per 24 hour  Intake 723 ml  Output -  Net 723 ml   Filed Weights   11/25/18 0636 11/26/18 0544 11/27/18 0608  Weight: 128.5 kg 124 kg 125.4 kg    Telemetry    Sinus rhythm with a couple episodes of NSVT  - Personally Reviewed  Twelve-lead EKG- sinus rhythm at 85 with Q waves L2 before and lateral T wave inversion.  I personally reviewed this EKG.  Physical Exam   GEN: Sitting on the edge of the hospital bed in no  acute distress.   Neck: No JVD, no carotid bruits Cardiac: RRR, no murmurs, rubs, or gallops.  Respiratory: Clear to auscultation bilaterally, no wheezes/ rales/ rhonchi GI: NABS, Soft, nontender, non-distended  MS: No edema; No deformity. Neuro:  Nonfocal, moving all extremities spontaneously Psych: Normal affect   Labs    Chemistry Recent Labs  Lab 11/24/18 [phone] 11/24/18 1101  11/25/18 0431 11/26/18 0444 11/27/18 0510  NA 142 142  --  138 139 140  K 3.6 3.3*  --  3.2* 3.8 3.9  CL 108 109  --  105 105 106  CO2 22 22  --  21* 24 22  GLUCOSE 143* 116*  --  99 117* 137*  BUN 13 13  --  14 18 19   CREATININE 1.06 0.92   < > 0.97 1.03 0.99  CALCIUM 9.3 8.7*  --  8.2* 8.2* 8.9  PROT 7.2 6.4*  --   --   --   --   ALBUMIN 3.9 3.6  --   --   --   --   AST 84* 109*  --   --   --   --  ALT 22 26  --   --   --   --   ALKPHOS 65 57  --   --   --   --   BILITOT 0.8 1.0  --   --   --   --   GFRNONAA >60 >60   < > >60 >60 >60  GFRAA >60 >60   < > >60 >60 >60  ANIONGAP 12 11  --  12 10 12    < > = values in this interval not displayed.     Hematology Recent Labs  Lab 11/25/18 0431 11/26/18 0444 11/27/18 0510  WBC 11.4* 9.6 11.2*  RBC 4.81 4.66 4.90  HGB 14.7 14.1 15.0  HCT 44.8 43.4 45.8  MCV 93.1 93.1 93.5  MCH 30.6 30.3 30.6  MCHC 32.8 32.5 32.8  RDW 12.8 12.7 12.6  PLT 180 168 195    Cardiac EnzymesNo results for input(s): TROPONINI in the last 168 hours. No results for input(s): TROPIPOC in the last 168 hours.   BNP Recent Labs  Lab 11/24/18 1101  BNP 624.9*     DDimer No results for input(s): DDIMER in the last 168 hours.   Radiology    No results found.  Cardiac Studies   CORONARY STENT INTERVENTION   11/25/2018  LEFT HEART CATH AND CORONARY ANGIOGRAPHY  Conclusion    Prox RCA to Mid RCA lesion is 95% stenosed.  Dist RCA lesion is 30% stenosed.  RPAV lesion is 100% stenosed.  Dist Cx lesion is 99% stenosed.  There is severe left ventricular  systolic dysfunction.  LV end diastolic pressure is severely elevated.  The left ventricular ejection fraction is less than 25% by visual estimate.  Ost LAD to Prox LAD lesion is 100% stenosed.  Post intervention, there is a 0% residual stenosis.  A drug-eluting stent was successfully placed using a STENT RESOLUTE ONYX 3.5X22.  A drug-eluting stent was successfully placed using a [place] [id number].  Mid LAD-1 lesion is 30% stenosed.  Mid LAD-2 lesion is 100% stenosed.  Post intervention, there is a 0% residual stenosis.  1. Severe three-vessel coronary artery disease. Occluded ostial and mid LAD likely due to late presenting anterior STEMI. Significant distal left circumflex disease but the supplied territory is small. Significant mid RCA stenosis with occluded posterior AV groove with left-to-right collaterals. 2. Severely reduced LV systolic function with an EF of 20 to 25% with anterior wall akinesis. LVEDP was 32 mmHg. 3. Successful PCI and drug-eluting stent placement to the ostial as well as mid LAD.  Recommendations: The patient was given furosemide 40 mg IV after the case. Continue furosemide 40 mg by mouth twice daily starting tomorrow. I started small dose carvedilol. If renal function remains stable, recommend adding a small dose ARB with plans to transition to [facility] as an outpatient. In addition, should consider spironolactone if blood pressure allows. Recommend dual antiplatelet therapy for at least 1 year. Recommend staged mid RCA PCI likely on Friday if the patient remains stable. The rest of his coronary artery disease should be treated medically.   Echo 11/24/2018 IMPRESSIONS   1. The left ventricle has severely reduced systolic function, with an ejection fraction of 25-30%. The cavity size was moderate to severely dilated. Left ventricular diastolic Doppler parameters are consistent with pseudonormalization. 2. There is akinesis of the  entire apical myocardium, inferoseptal, anteroseptal and anterior myocardium. 3. The right ventricle has normal systolic function. The cavity was normal. There is no increase in right  ventricular wall thickness. Right ventricular systolic pressure could not be assessed. 4. Left atrial size was moderately dilated. 5. The interatrial septum appears to be lipomatous.   Patient Profile     Evan [name] a 53 y.o.malewith a history of hypertension, hyperlipidemia, diabetes mellitus, and obesity transferred from [facility] for further evaluation and treatment of non-STEMI.   His EKG shows Q waves across the precordium out of the 4 with biphasic ST segments suggesting an evolving anterior myocardial infarction.   Assessment & Plan    1. Late presenting anterior STEMI: patient [facility] 11/23/2018 with chest pain x1-2 weeks. Trop peaked at [address]. He underwent LHC 11/24/2018 which showed severe 3 vessel CAD s/p PCI/DES to ostial and mLAD, he underwent staged mid RCA PCI and stenting by Dr. Fletcher Anon .  He did demonstrate that the LAD was widely patent.  No recurrent chest pain - Continue aspirin, brilinta, statin, and BBlocker   2. Acute systolic CHF/ischemic cardiomyopathy: Echo showed EF 25-30%. Patient was given IV lasix x1 dose and transitioned to po 40mg  daily. UOP with incomplete documentation. Weight 283lbs>273lbs. No significant volume overload on exam. Patient was started on BBlocker and spironolactone. LifeVest also ordered for primary prevention.   - Continue carvedilol, spironolactone, and lasix - Will start losartan 25 mg a day.  I do not think he will be able to afford Entresto.  Follow-up with advanced heart failure clinic..   3. HTN: BP improved on carvedilol and spironolactone - Continue current regimen - Will plan to initiate entresto prior to discharge if BP will allow  4. HLD: LDL 95 this admission.  - Continue atorvastatin  5. DM type 2: A1C  6.9; at goal of <7. - Continue jardiance and insulin per primary team  6. NSVT: patient denies palpitations. K improved to 3.8 today - Continue aggressive electrolyte repletion to maintain K>4, Mg>2  The patient has had education about LifeVest and understands its use.  His chest x-ray shows some interstitial edema.  He was given IV Lasix.  Placed him on p.o. Lasix in addition of spironolactone.  Electrolytes are stable.  Will check a basic metabolic panel in the morning.  For questions or updates, please contact [place] Please consult [email or web address] for contact info under Cardiology/STEMI.  I did increase his carvedilol from 3.125 to 6.25 mg p.o. twice daily.      Signed, Quay Burow, MD  11/27/2018, 9:41 AM   [phone]

## 2018-11-27 NOTE — Progress Notes (Signed)
Pt has called me in the room  complaining of SOB when lying flat. Pt sates that he feels like he can't catch his breath especially when he is lying flat. He denies any CP. MD paged.

## 2018-11-27 NOTE — Discharge Summary (Addendum)
Physician Discharge Summary  Evan [name]:[id number] DOB: [birth year] DOA: 11/24/2018  PCP: Dettinger, Fransisca Kaufmann, MD  Admit date: 11/24/2018 Discharge date: 11/28/2018  Time spent: 45 minutes  Recommendations for Outpatient Follow-up:  Patient will be discharged to home.  Patient will need to follow up with primary care provider within one week of discharge, repeat BMP.  Follow up with cardiology. Patient should continue medications as prescribed.  Patient should follow a heart healthy/carb modified diet.   Discharge Diagnoses:  Late presenting Anterior STEMI Acute systolic CHF/ischemic cardiomyopathy Essential hypertension Alcohol and substance abuse Diabetes mellitus, type II Hyperlipidemia Nonsustained VT Obesity  Discharge Condition: Stable  Diet recommendation: heart healthy/carb modified   Filed Weights   11/26/18 0544 11/27/18 0608 11/28/18 0433  Weight: 124 kg 125.4 kg 124.8 kg    History of present illness:  On 11/24/2018 by Dr. Dia Crawford  [name] a 53 y.o.WM PMHxdiabetes type 2 uncontrolled with complication, HTN, HLD, gout, morbid obesity.States over the last week has had multiple episodes of chest pain centered substernal/left chest with radiation down left arm and into left neck most episodes have been secondary to exertion and relieved with rest. Positive S OB, negative diaphoresis, positive nausea, negative vomiting. Currently negative chest pain after NTG and morphine.  Hospital Course:  Late presenting Anterior STEMI -Patient presented with positive troponin.  EKG suggestive of anterior lateral MI -Echocardiogram EF of 25 to 30%.  LV diastolic parameters consistent with pseudonormalization.  Akinesis of the entire apical myocardium, inferior septal, anterior septal and anterior myocardium. -Cardiology consulted and appreciated -Left heart cath: Severe three-vessel coronary artery disease.  Occluded ostial and mid LAD.  Significant distal left  circumflex disease.  Mid RCA stenosis.  EF of 20 to 25%.  Patient did receive 2 drug-eluting stents-ostial and mid LAD.  Commendations were to start IV Lasix followed by oral Lasix and Coreg.  Consider small dose ARB spironolactone. Dual antiplatelet therapy for 1 year.  Recommend staged mid RCA PCI on Friday, 11/26/2018.  -lipid panel: TC 158, LDL 95, TG 156, HDL 32 -Currently, no chest pain. -Continue aspirin, Brilinta, statin, Coreg -Cath on 11/26/2018: The patent LAD stents with unchanged coronary anatomy.  LVEDP 13 mmHg with mild hypotension.  Successful angioplasty and drug-eluting stent placement to the proximal/mid right coronary artery.  Commendations dual antiplatelet therapy.  Lasix 40 mg daily.  Small dose of losartan, if well-tolerated can be switched to [facility] as an outpatient.  LifeVest.  Acute systolic CHF/ischemic cardiomyopathy -Echocardiogram and cardiac catheterization as above -Patient was given IV Lasix x1 dose and now on oral Lasix -Currently euvolemic on exam -No further episodes of SOB -Placed on spironolactone, lasix, coreg  Essential hypertension -BP stable, continue Coreg and oral Lasix  Alcohol and substance abuse -was placed on CIWA protocol-  No withdrawal symptoms -Drug abuse positive for opiates and cocaine -Alcohol level was negative -Discussed cessation  Diabetes mellitus, type II -hemoglobin A1c 7 -Continue insulin sliding scale and CBG monitoring  Hyperlipidemia -Lipid panel as above, continue statin -Patient will need repeat LFTs and lipid panel in 6 weeks  Nonsustained VT -Potassium 3.3, magnesium 2 -potassium replaced, and patient will be discharge with supplementation  -Currently asymptomatic  Obesity -BMI 34.03  ?Sleep apnea -Patient stated he had a sleep study, however it was incomplete -needs sleep study or overnight pulse ox as an outpatient  Consultants Cardiology  Procedures  Echocardiogram Cardiac catheterization  x 2  Discharge Exam: Vitals:   11/28/18 0433 11/28/18  0843  BP: 108/71 109/79  Pulse: 85 88  Resp: 17   Temp: 97.7 F (36.5 C)   SpO2: 98%      General: Well developed, well nourished, NAD, appears stated age  HEENT: NCAT, mucous membranes moist.  Cardiovascular: S1 S2 auscultated, RRR, no murmur  Respiratory: Clear to auscultation bilaterally with equal chest rise  Abdomen: Soft, nontender, nondistended, + bowel sounds  Extremities: warm dry without cyanosis clubbing or edema  Neuro: AAOx3, nonfocal  Psych: Normal affect and demeanor, pleasant  Discharge Instructions Discharge Instructions    Amb Referral to Cardiac Rehabilitation   Complete by: As directed    Diagnosis:  NSTEMI Coronary Stents     After initial evaluation and assessments completed: Virtual Based Care may be provided alone or in conjunction with Phase 2 Cardiac Rehab based on patient barriers.: Yes   Discharge instructions   Complete by: As directed    Patient will be discharged to home.  Patient will need to follow up with primary care provider within one week of discharge, repeat BMP.  Follow up with cardiology. Patient should continue medications as prescribed.  Patient should follow a heart healthy/carb modified diet.     Allergies as of 11/28/2018   No Known Allergies     Medication List    STOP taking these medications   allopurinol 300 MG tablet Commonly known as: ZYLOPRIM   benazepril 20 MG tablet Commonly known as: LOTENSIN   ibuprofen 200 MG tablet Commonly known as: ADVIL   ranitidine 150 MG tablet Commonly known as: Zantac   sildenafil 20 MG tablet Commonly known as: REVATIO     TAKE these medications   Accu-Chek Aviva Plus test strip Generic drug: glucose blood USE TO CHECK SUGAR TWICE DAILY   aspirin EC 81 MG tablet Take 81 mg by mouth daily.   atorvastatin 80 MG tablet Commonly known as: LIPITOR Take 1 tablet (80 mg total) by mouth daily at 6 PM.   BD Pen  Needle Nano U/F 32G X 4 MM Misc Generic drug: Insulin Pen Needle USE DAILY WITH INSULIN   carvedilol 12.5 MG tablet Commonly known as: COREG Take 1 tablet (12.5 mg total) by mouth 2 (two) times daily with a meal.   famotidine 20 MG tablet Commonly known as: Pepcid Take 1 tablet (20 mg total) by mouth 2 (two) times daily.   fenofibrate 145 MG tablet Commonly known as: TRICOR TAKE 1 TABLET BY MOUTH EVERY DAY   Fish Oil 1000 MG Caps Take 2,000 mg by mouth daily.   fluticasone 50 MCG/ACT nasal spray Commonly known as: FLONASE Place 1 spray into both nostrils 2 (two) times a day.   furosemide 20 MG tablet Commonly known as: LASIX Take 1 tablet (20 mg total) by mouth daily.   HYDROcodone-acetaminophen 7.5-325 MG tablet Commonly known as: NORCO Take 1 tablet by mouth every 8 (eight) hours as needed for moderate pain or severe pain. What changed:   how much to take  when to take this   Insulin Detemir 100 UNIT/ML Pen Commonly known as: Levemir FlexTouch INJECT 30 UNITS DAILY AT BEDTIME - UP TO 50 UNITS AS NEEDED   Jardiance 25 MG Tabs tablet Generic drug: empagliflozin Take 25 mg by mouth daily.   losartan 25 MG tablet Commonly known as: COZAAR Take 1 tablet (25 mg total) by mouth daily. Start taking on: November 29, 2018   nitroGLYCERIN 0.4 MG SL tablet Commonly known as: NITROSTAT Place 1 tablet (0.4 mg  total) under the tongue every 5 (five) minutes x 3 doses as needed for chest pain.   Potassium Chloride ER 20 MEQ Tbcr Take 10 mEq by mouth daily.   spironolactone 25 MG tablet Commonly known as: ALDACTONE Take 0.5 tablets (12.5 mg total) by mouth daily.   ticagrelor 90 MG Tabs tablet Commonly known as: BRILINTA Take 1 tablet (90 mg total) by mouth 2 (two) times daily.      No Known Allergies Follow-up Information    Dettinger, Fransisca Kaufmann, MD. Schedule an appointment as soon as possible for a visit in 1 week(s).   Specialties: Family Medicine, Cardiology Why:  Hospital follow up Contact information: [place] [address] [phone]        Lorretta Harp, MD .   Specialties: Cardiology, Radiology Contact information: [address] [place] [address] [phone]            The results of significant diagnostics from this hospitalization (including imaging, microbiology, ancillary and laboratory) are listed below for reference.    Significant Diagnostic Studies: Dg Chest Port 1 View  Result Date: 11/27/2018 CLINICAL DATA:  Shortness of breath x 2-3 weeks. Pt states he felt he could not breathe last night. Hx of sleep apnea testing that did not get completed. Per pt has gotten stents placed while admitted here. Pt had a heart attack x last Tuesday. Slight cough today. Hx of diabetes, HTN. EXAM: PORTABLE CHEST 1 VIEW COMPARISON:  11/24/2018 and older exams. FINDINGS: Cardiac silhouette is normal in size. No mediastinal or hilar masses. No evidence of adenopathy. Mild peripheral interstitial thickening in the lower lungs, stable. Lungs otherwise clear. No pleural effusion or pneumothorax. Skeletal structures are grossly intact. IMPRESSION: No acute cardiopulmonary disease. Electronically Signed   By: Lajean Manes M.D.   On: 11/27/2018 09:42   Portable Chest X-ray 1 View  Result Date: 11/24/2018 CLINICAL DATA:  Acute coronary syndrome. EXAM: PORTABLE CHEST 1 VIEW COMPARISON:  Radiograph of November 24, 2018. FINDINGS: The heart size and mediastinal contours are within normal limits. Both lungs are clear. No pneumothorax or pleural effusion is noted. The visualized skeletal structures are unremarkable. IMPRESSION: No active disease. Electronically Signed   By: Marijo Conception M.D.   On: 11/24/2018 09:14    Microbiology: Recent Results (from the past 240 hour(s))  SARS Coronavirus 2 (CEPHEID - Performed in [facility] hospital lab), Hosp Order     Status: None   Collection Time: 11/24/18  8:27 AM   Specimen:  Nasopharyngeal Swab  Result Value Ref Range Status   SARS Coronavirus 2 NEGATIVE NEGATIVE Final    Comment: (NOTE) If result is NEGATIVE SARS-CoV-2 target nucleic acids are NOT DETECTED. The SARS-CoV-2 RNA is generally detectable in upper and lower  respiratory specimens during the acute phase of infection. The lowest  concentration of SARS-CoV-2 viral copies this assay can detect is 250  copies / mL. A negative result does not preclude SARS-CoV-2 infection  and should not be used as the sole basis for treatment or other  patient management decisions.  A negative result may occur with  improper specimen collection / handling, submission of specimen other  than nasopharyngeal swab, presence of viral mutation(s) within the  areas targeted by this assay, and inadequate number of viral copies  (<250 copies / mL). A negative result must be combined with clinical  observations, patient history, and epidemiological information. If result is POSITIVE SARS-CoV-2 target nucleic acids are DETECTED.  The SARS-CoV-2 RNA is generally detectable in upper and lower  respiratory specimens dur ing the acute phase of infection.  Positive  results are indicative of active infection with SARS-CoV-2.  Clinical  correlation with patient history and other diagnostic information is  necessary to determine patient infection status.  Positive results do  not rule out bacterial infection or co-infection with other viruses. If result is PRESUMPTIVE POSTIVE SARS-CoV-2 nucleic acids MAY BE PRESENT.   A presumptive positive result was obtained on the submitted specimen  and confirmed on repeat testing.  While 2019 novel coronavirus  (SARS-CoV-2) nucleic acids may be present in the submitted sample  additional confirmatory testing may be necessary for epidemiological  and / or clinical management purposes  to differentiate between  SARS-CoV-2 and other Sarbecovirus currently known to infect humans.  If clinically  indicated additional testing with an alternate test  methodology [phone]) is advised. The SARS-CoV-2 RNA is generally  detectable in upper and lower respiratory sp ecimens during the acute  phase of infection. The expected result is Negative. Fact Sheet for Patients:  StrictlyIdeas.no Fact Sheet for Healthcare Providers: BankingDealers.co.za This test is not yet approved or cleared by the [place] FDA and has been authorized for detection and/or diagnosis of SARS-CoV-2 by FDA under an Emergency Use Authorization (EUA).  This EUA will remain in effect (meaning this test can be used) for the duration of the COVID-19 declaration under Section 564(b)(1) of the Act, 21 U.S.C. section 360bbb-3(b)(1), unless the authorization is terminated or revoked sooner. Performed at [facility], [place] [address] [place], [place] [address]      Labs: Basic Metabolic Panel: Recent Labs  Lab 11/24/18 1101 11/24/18 1500 11/25/18 0431 11/26/18 0444 11/27/18 0510 11/28/18 0338  NA 142  --  138 139 140 138  K 3.3*  --  3.2* 3.8 3.9 3.3*  CL 109  --  105 105 106 105  CO2 22  --  21* 24 22 23   GLUCOSE 116*  --  99 117* 137* 118*  BUN 13  --  14 18 19 17   CREATININE 0.92 1.07 0.97 1.03 0.99 0.94  CALCIUM 8.7*  --  8.2* 8.2* 8.9 8.9  MG 1.9  --   --  2.0  --   --    Liver Function Tests: Recent Labs  Lab 11/24/18 0613 11/24/18 1101  AST 84* 109*  ALT 22 26  ALKPHOS 65 57  BILITOT 0.8 1.0  PROT 7.2 6.4*  ALBUMIN 3.9 3.6   No results for input(s): LIPASE, AMYLASE in the last 168 hours. No results for input(s): AMMONIA in the last 168 hours. CBC: Recent Labs  Lab 11/24/18 0613 11/24/18 1101 11/24/18 1500 11/25/18 0431 11/26/18 0444 11/27/18 0510  WBC 11.6* 12.7* 15.3* 11.4* 9.6 11.2*  NEUTROABS 8.7* 10.0*  --   --   --   --   HGB 15.9 14.7 16.6 14.7 14.1 15.0  HCT 47.8 44.6 50.3 44.8 43.4 45.8  MCV 92.6 92.5 93.3 93.1 93.1  93.5  PLT 239 218 253 180 168 195   Cardiac Enzymes: No results for input(s): CKTOTAL, CKMB, CKMBINDEX, TROPONINI in the last 168 hours. BNP: BNP (last 3 results) Recent Labs    11/24/18 1101  BNP 624.9*    ProBNP (last 3 results) No results for input(s): PROBNP in the last 8760 hours.  CBG: Recent Labs  Lab 11/27/18 0748 11/27/18 1217 11/27/18 1724 11/27/18 2123 11/28/18 0737  GLUCAP 121* 125* 114* 175*  110*       Signed:  Cristal Ford  [facility] 11/28/2018, 11:18 AM

## 2018-11-27 NOTE — Progress Notes (Signed)
CARDIAC REHAB PHASE I  [phone] - [id number]  Pt was ambulating on his own prior to me getting to his room. He made multiple laps in the hallway. He had no complaints and said that he tolerated it well. He stated that he has been taking several walks on his own per day. Pt educated on exercise. Pt states that he was very sedentary before his MI, but seems motivated to change that.   Philis Kendall, MS, ACSM CEP 11/27/2018 10:04 AM

## 2018-11-27 NOTE — Progress Notes (Signed)
PROGRESS NOTE    [name]  EPP:[id number] DOB: 1965-09-15 DOA: 11/24/2018 PCP: Dettinger, Fransisca Kaufmann, MD   Brief Narrative:  HPI On 11/24/2018 by Dr. Dia Crawford  Evan [name] is a 53 y.o. WM PMHx diabetes type 2 uncontrolled with complication, HTN, HLD, gout, morbid obesity.  States over the last week has had multiple episodes of chest pain centered substernal/left chest with radiation down left arm and into left neck most episodes have been secondary to exertion and relieved with rest.  Positive S OB, negative diaphoresis, positive nausea, negative vomiting.  Currently negative chest pain after NTG and morphine.  Interim history  Patient admitted for chest pain and noted to have STEMI and had cath with 2 stents placed. S/p cath on 7/10.  Assessment & Plan   Late presenting Anterior STEMI -Patient presented with positive troponin. EKG suggestive of anterior lateral MI -Echocardiogram EF of 25 to 30%. LV diastolic parameters consistent with pseudonormalization. Akinesis of the entire apical myocardium, inferior septal, anterior septal and anterior myocardium. -Cardiology consulted and appreciated -Left heart cath: Severe three-vessel coronary artery disease. Occluded ostial and mid LAD. Significant distal left circumflex disease. Mid RCA stenosis. EF of 20 to 25%. Patient did receive 2 drug-eluting stents-ostial and mid LAD. Commendations were to start IV Lasix followed by oral Lasix and Coreg. Consider small dose ARB spironolactone. Dual antiplatelet therapy for 1 year. Recommend staged mid RCA PCI on Friday, 11/26/2018.  -lipid panel: TC 158, LDL 95, TG 156, HDL 32 -Currently, no chest pain. -Continue aspirin, Brilinta, statin, Coreg -Cath on 11/26/2018: The patent LAD stents with unchanged coronary anatomy.  LVEDP 13 mmHg with mild hypotension.  Successful angioplasty and drug-eluting stent placement to the proximal/mid right coronary artery.  Commendations dual antiplatelet  therapy.  Lasix 40 mg daily.  Small dose of losartan, if well-tolerated can be switched to [facility] as an outpatient.  LifeVest.  Acute systolic CHF/ischemic cardiomyopathy -Echocardiogram and cardiac catheterization as above -Patient was given IV Lasix x1 dose and now on oral Lasix -had shortness of breath overnight, and was given dose of lasix -feels he cannot breath through his nose- flonase ordered  -Currently euvolemic on exam -Started on losartan -Continue coreg, lasix  Essential hypertension -BP stable, continue Coreg, losartan, and oral Lasix  Alcohol and substance abuse -Continue CIWA protocol- currently no withdrawal symptoms -Drug abuse positive for opiates and cocaine -Alcohol level was negative -Discussed cessation  Diabetes mellitus, type II -hemoglobin A1c 7 -Continue insulin sliding scale and CBG monitoring  Hyperlipidemia -Lipid panel as above, continue statin -Patient will need repeat LFTs and lipid panel in 6 weeks  Nonsustained VT -Potassium 3.9, magnesium 2 -Currently asymptomatic  Obesity -BMI 34.03  ?Sleep apnea -Patient stated he had a sleep study, however it was incomplete -needs sleep study or overnight pulse ox as an outpatient  DVT Prophylaxis  lovenox  Code Status: Full  Family Communication: None at bedside  Disposition Plan: Admitted. Pending further cardiology recommendations. Home when stable.   Consultants Cardiology  Procedures  Echocardiogram Cardiac catheterization  Antibiotics   Anti-infectives (From admission, onward)   None      Subjective:   [name] seen and examined today.  States he was unable to sleep well last night as he could not breathe through his nose.  Felt very short of breath when laying down.  Denies current chest pain or shortness of breath while sitting up.  Denies abdominal pain, nausea or vomiting, diarrhea or constipation, dizziness or  headache. Objective:   Vitals:   11/26/18 1703  11/26/18 2037 11/27/18 0608 11/27/18 0837  BP: 124/87 105/62 107/73 106/67  Pulse: 81 86 81 90  Resp:      Temp:  97.7 F (36.5 C) (!) 97.4 F (36.3 C)   TempSrc:  Oral Oral   SpO2: 98% 96% 100%   Weight:   125.4 kg   Height:        Intake/Output Summary (Last 24 hours) at 11/27/2018 1019 Last data filed at 11/27/2018 0910 Gross per 24 hour  Intake 723 ml  Output -  Net 723 ml   Filed Weights   11/25/18 0636 11/26/18 0544 11/27/18 1610  Weight: 128.5 kg 124 kg 125.4 kg   Exam  General: Well developed, well nourished, NAD, appears stated age  40: NCAT, mucous membranes moist.   Neck: Supple  Cardiovascular: S1 S2 auscultated, no murmur, RRR  Respiratory: Clear to auscultation bilaterally with equal chest rise  Abdomen: Soft, nontender, nondistended, + bowel sounds  Extremities: warm dry without cyanosis clubbing or edema  Neuro: AAOx3, nonfocal  Psych: Pleasant, appropriate mood and affect  Data Reviewed: I have personally reviewed following labs and imaging studies  CBC: Recent Labs  Lab 11/24/18 0613 11/24/18 1101 11/24/18 1500 11/25/18 0431 11/26/18 0444 11/27/18 0510  WBC 11.6* 12.7* 15.3* 11.4* 9.6 11.2*  NEUTROABS 8.7* 10.0*  --   --   --   --   HGB 15.9 14.7 16.6 14.7 14.1 15.0  HCT 47.8 44.6 50.3 44.8 43.4 45.8  MCV 92.6 92.5 93.3 93.1 93.1 93.5  PLT 239 218 253 180 168 960   Basic Metabolic Panel: Recent Labs  Lab 11/24/18 0613 11/24/18 1101 11/24/18 1500 11/25/18 0431 11/26/18 0444 11/27/18 0510  NA 142 142  --  138 139 140  K 3.6 3.3*  --  3.2* 3.8 3.9  CL 108 109  --  105 105 106  CO2 22 22  --  21* 24 22  GLUCOSE 143* 116*  --  99 117* 137*  BUN 13 13  --  14 18 19   CREATININE 1.06 0.92 1.07 0.97 1.03 0.99  CALCIUM 9.3 8.7*  --  8.2* 8.2* 8.9  MG  --  1.9  --   --  2.0  --    GFR: Estimated Creatinine Clearance: 125.7 mL/min (by C-G formula based on SCr of 0.99 mg/dL). Liver Function Tests: Recent Labs  Lab 11/24/18  0613 11/24/18 1101  AST 84* 109*  ALT 22 26  ALKPHOS 65 57  BILITOT 0.8 1.0  PROT 7.2 6.4*  ALBUMIN 3.9 3.6   No results for input(s): LIPASE, AMYLASE in the last 168 hours. No results for input(s): AMMONIA in the last 168 hours. Coagulation Profile: Recent Labs  Lab 11/24/18 0613  INR 1.1   Cardiac Enzymes: No results for input(s): CKTOTAL, CKMB, CKMBINDEX, TROPONINI in the last 168 hours. BNP (last 3 results) No results for input(s): PROBNP in the last 8760 hours. HbA1C: Recent Labs    11/24/18 1101 11/25/18 0431  HGBA1C 7.0* 6.9*   CBG: Recent Labs  Lab 11/26/18 0734 11/26/18 1154 11/26/18 1656 11/26/18 2121 11/27/18 0748  GLUCAP 109* 139* 98 192* 121*   Lipid Profile: Recent Labs    11/24/18 1102 11/25/18 0431  CHOL 181 158  HDL 28* 32*  LDLCALC 113* 95  TRIG 199* 156*  CHOLHDL 6.5 4.9   Thyroid Function Tests: Recent Labs    11/24/18 1057  TSH 1.022  Anemia Panel: No results for input(s): VITAMINB12, FOLATE, FERRITIN, TIBC, IRON, RETICCTPCT in the last 72 hours. Urine analysis: No results found for: COLORURINE, APPEARANCEUR, LABSPEC, PHURINE, GLUCOSEU, HGBUR, BILIRUBINUR, KETONESUR, PROTEINUR, UROBILINOGEN, NITRITE, LEUKOCYTESUR Sepsis Labs: @LABRCNTIP (procalcitonin:4,lacticidven:4)  ) Recent Results (from the past 240 hour(s))  SARS Coronavirus 2 (CEPHEID - Performed in [place] hospital lab), Hosp Order     Status: None   Collection Time: 11/24/18  8:27 AM   Specimen: Nasopharyngeal Swab  Result Value Ref Range Status   SARS Coronavirus 2 NEGATIVE NEGATIVE Final    Comment: (NOTE) If result is NEGATIVE SARS-CoV-2 target nucleic acids are NOT DETECTED. The SARS-CoV-2 RNA is generally detectable in upper and lower  respiratory specimens during the acute phase of infection. The lowest  concentration of SARS-CoV-2 viral copies this assay can detect is 250  copies / mL. A negative result does not preclude SARS-CoV-2 infection  and  should not be used as the sole basis for treatment or other  patient management decisions.  A negative result may occur with  improper specimen collection / handling, submission of specimen other  than nasopharyngeal swab, presence of viral mutation(s) within the  areas targeted by this assay, and inadequate number of viral copies  (<250 copies / mL). A negative result must be combined with clinical  observations, patient history, and epidemiological information. If result is POSITIVE SARS-CoV-2 target nucleic acids are DETECTED. The SARS-CoV-2 RNA is generally detectable in upper and lower  respiratory specimens dur ing the acute phase of infection.  Positive  results are indicative of active infection with SARS-CoV-2.  Clinical  correlation with patient history and other diagnostic information is  necessary to determine patient infection status.  Positive results do  not rule out bacterial infection or co-infection with other viruses. If result is PRESUMPTIVE POSTIVE SARS-CoV-2 nucleic acids MAY BE PRESENT.   A presumptive positive result was obtained on the submitted specimen  and confirmed on repeat testing.  While 2019 novel coronavirus  (SARS-CoV-2) nucleic acids may be present in the submitted sample  additional confirmatory testing may be necessary for epidemiological  and / or clinical management purposes  to differentiate between  SARS-CoV-2 and other Sarbecovirus currently known to infect humans.  If clinically indicated additional testing with an alternate test  methodology [phone]) is advised. The SARS-CoV-2 RNA is generally  detectable in upper and lower respiratory sp ecimens during the acute  phase of infection. The expected result is Negative. Fact Sheet for Patients:  StrictlyIdeas.no Fact Sheet for Healthcare Providers: BankingDealers.co.za This test is not yet approved or cleared by the [place] FDA and has been  authorized for detection and/or diagnosis of SARS-CoV-2 by FDA under an Emergency Use Authorization (EUA).  This EUA will remain in effect (meaning this test can be used) for the duration of the COVID-19 declaration under Section 564(b)(1) of the Act, 21 U.S.C. section 360bbb-3(b)(1), unless the authorization is terminated or revoked sooner. Performed at [facility], [place] [address] [place],  [address]       Radiology Studies: Dg Chest Port 1 View  Result Date: 11/27/2018 CLINICAL DATA:  Shortness of breath x 2-3 weeks. Pt states he felt he could not breathe last night. Hx of sleep apnea testing that did not get completed. Per pt has gotten stents placed while admitted here. Pt had a heart attack x last Tuesday. Slight cough today. Hx of diabetes, HTN. EXAM: PORTABLE CHEST 1 VIEW COMPARISON:  11/24/2018 and older exams.  FINDINGS: Cardiac silhouette is normal in size. No mediastinal or hilar masses. No evidence of adenopathy. Mild peripheral interstitial thickening in the lower lungs, stable. Lungs otherwise clear. No pleural effusion or pneumothorax. Skeletal structures are grossly intact. IMPRESSION: No acute cardiopulmonary disease. Electronically Signed   By: Lajean Manes M.D.   On: 11/27/2018 09:42     Scheduled Meds: . aspirin EC  81 mg Oral Daily  . atorvastatin  80 mg Oral q1800  . carvedilol  12.5 mg Oral BID WC  . enoxaparin (LOVENOX) injection  40 mg Subcutaneous Q24H  . famotidine  20 mg Oral BID  . fluticasone  1 spray Each Nare BID  . folic acid  1 mg Oral Daily  . furosemide  20 mg Oral Daily  . [START ON 11/28/2018] furosemide  40 mg Oral Daily  . insulin aspart  0-9 Units Subcutaneous TID AC & HS  . insulin detemir  30 Units Subcutaneous QHS  . losartan  25 mg Oral Daily  . losartan  25 mg Oral Daily  . sodium chloride flush  3 mL Intravenous Q12H  . spironolactone  12.5 mg Oral Daily  . thiamine  100 mg Oral Daily  . ticagrelor  90 mg Oral BID    Continuous Infusions: . sodium chloride       LOS: 3 days   Time Spent in minutes   30 minutes  Stormee Duda D.O. on 11/27/2018 at 10:19 AM  Between 7am to 7pm - Please see pager noted on [email or web address]  After 7pm go to [email or web address]  And look for the night coverage person covering for me after hours  [facility] Office  [phone]

## 2018-11-28 ENCOUNTER — Encounter (HOSPITAL_COMMUNITY): Payer: Self-pay

## 2018-11-28 ENCOUNTER — Other Ambulatory Visit: Payer: Self-pay

## 2018-11-28 DIAGNOSIS — I519 Heart disease, unspecified: Secondary | ICD-10-CM

## 2018-11-28 LAB — BASIC METABOLIC PANEL
Anion gap: 10 (ref 5–15)
BUN: 17 mg/dL (ref 6–20)
CO2: 23 mmol/L (ref 22–32)
Calcium: 8.9 mg/dL (ref 8.9–10.3)
Chloride: 105 mmol/L (ref 98–111)
Creatinine, Ser: 0.94 mg/dL (ref 0.61–1.24)
GFR calc Af Amer: >60 mL/min (ref >60)
GFR calc non Af Amer: >60 mL/min (ref >60)
Glucose, Bld: 118 mg/dL — ABNORMAL HIGH (ref 70–99)
Potassium: 3.3 mmol/L — ABNORMAL LOW (ref 3.5–5.1)
Sodium: 138 mmol/L (ref 135–145)

## 2018-11-28 LAB — GLUCOSE, CAPILLARY: Glucose-Capillary: 110 mg/dL — ABNORMAL HIGH (ref 70–99)

## 2018-11-28 MED ORDER — LOSARTAN POTASSIUM 25 MG PO TABS: 25.0000 mg | ORAL_TABLET | Freq: Every day | ORAL | 1 refills | Status: DC

## 2018-11-28 MED ORDER — POTASSIUM CHLORIDE ER 10 MEQ PO TBCR: 10.0000 meq | EXTENDED_RELEASE_TABLET | Freq: Every day | ORAL | 0 refills | Status: DC

## 2018-11-28 MED ORDER — SPIRONOLACTONE 25 MG PO TABS: 12.5000 mg | ORAL_TABLET | Freq: Every day | ORAL | 1 refills | Status: DC

## 2018-11-28 MED ORDER — FLUTICASONE PROPIONATE 50 MCG/ACT NA SUSP: 1.0000 | Freq: Two times a day (BID) | NASAL | 1 refills | Status: DC

## 2018-11-28 MED ORDER — POTASSIUM CHLORIDE ER 20 MEQ PO TBCR: 10.0000 meq | EXTENDED_RELEASE_TABLET | Freq: Every day | ORAL | 0 refills | Status: DC

## 2018-11-28 MED ORDER — TICAGRELOR 90 MG PO TABS: 90.0000 mg | ORAL_TABLET | Freq: Two times a day (BID) | ORAL | 1 refills | Status: DC

## 2018-11-28 MED ORDER — NITROGLYCERIN 0.4 MG SL SUBL: 0.4000 mg | SUBLINGUAL_TABLET | SUBLINGUAL | 0 refills | Status: DC | PRN

## 2018-11-28 MED ORDER — POTASSIUM CHLORIDE CRYS ER 20 MEQ PO TBCR
40.0000 meq | EXTENDED_RELEASE_TABLET | Freq: Once | ORAL | Status: AC
  Administered 2018-11-28: 40 meq via ORAL
  Filled 2018-11-28: qty 2

## 2018-11-28 MED ORDER — FUROSEMIDE 40 MG PO TABS: 40.0000 mg | ORAL_TABLET | Freq: Every day | ORAL | 1 refills | Status: DC

## 2018-11-28 MED ORDER — FUROSEMIDE 20 MG PO TABS: 20.0000 mg | ORAL_TABLET | Freq: Every day | ORAL | 1 refills | Status: DC

## 2018-11-28 MED ORDER — ATORVASTATIN CALCIUM 80 MG PO TABS: 80.0000 mg | ORAL_TABLET | Freq: Every day | ORAL | 1 refills | Status: DC

## 2018-11-28 MED ORDER — CARVEDILOL 12.5 MG PO TABS: 12.5000 mg | ORAL_TABLET | Freq: Two times a day (BID) | ORAL | 1 refills | Status: DC

## 2018-11-28 NOTE — Discharge Instructions (Signed)
Acute Coronary Syndrome °Acute coronary syndrome (ACS) is a serious problem in which there is suddenly not enough blood and oxygen reaching the heart. ACS can result in chest pain or a heart attack. °This condition is a medical emergency. If you have any symptoms of this condition, get help right away. °What are the causes? °This condition may be caused by: °· A buildup of fat and cholesterol inside the arteries (atherosclerosis). This is the most common cause. The buildup (plaque) can cause blood vessels in the heart (coronary arteries) to become narrow or blocked, which reduces blood flow to the heart. Plaque can also break off and lead to a clot, which can block an artery and cause a heart attack or stroke. °· Sudden tightening of the muscles around the coronary arteries (coronary spasm). °· Tearing of a coronary artery (spontaneous coronary artery dissection). °· Very low blood pressure (hypotension). °· An abnormal heartbeat (arrhythmia). °· Other medical conditions that cause a decrease of oxygen to the heart, such as anemiaorrespiratory failure. °· Using cocaine or methamphetamine. °What increases the risk? °The following factors may make you more likely to develop this condition: °· Age. The risk for ACS increases as you get older. °· History of chest pain, heart attack, peripheral artery disease, or stroke. °· Having taken chemotherapy or immune-suppressing medicines. °· Being male. °· Family history of chest pain, heart disease, or stroke. °· Smoking. °· Not exercising enough. °· Being overweight. °· High cholesterol. °· High blood pressure (hypertension). °· Diabetes. °· Excessive alcohol use. °What are the signs or symptoms? °Common symptoms of this condition include: °· Chest pain. The pain may last a long time, or it may stop and come back (recur). It may feel like: °? Crushing or squeezing. °? Tightness, pressure, fullness, or heaviness. °· Arm, neck, jaw, or back pain. °· Heartburn or  indigestion. °· Shortness of breath. °· Nausea. °· Sudden cold sweats. °· Light-headedness. °· Dizziness or passing out. °· Tiredness (fatigue). °Sometimes there are no symptoms. °How is this diagnosed? °This condition may be diagnosed based on: °· Your medical history and symptoms. °· Imaging tests, such as: °? An electrocardiogram (ECG). This measures the heart's electrical activity. °? X-rays. °? CT scan. °? A coronary angiogram. For this test, dye is injected into the heart arteries and then X-rays are taken. °? Myocardial perfusion imaging. This test shows how well blood flows through your heart muscle. °· Blood tests. These may be repeated at certain time intervals. °· Exercise stress testing. °· Echocardiogram. This is a test that uses sound waves to produce detailed images of the heart. °How is this treated? °Treatment for this condition may include: °· Oxygen therapy. °· Medicines, such as: °? Antiplatelet medicines and blood-thinning medicines, such as aspirin. These help prevent blood clots. °? Medicine that dissolves any blood clots (fibrinolytic therapy). °? Blood pressure medicines. °? Nitroglycerin. This helps widen blood vessels to improve blood flow. °? Pain medicine. °? Cholesterol-lowering medicine. °· Surgery, such as: °? Coronary angioplasty with stent placement. This involves placing a small piece of metal that looks like mesh or a spring into a narrow coronary artery. This widens the artery and keeps it open. °? Coronary artery bypass surgery. This involves taking a section of a blood vessel from a different part of your body and placing it on the blocked coronary artery to allow blood to flow around the blockage. °· Cardiac rehabilitation. This is a program that includes exercise training, education, and counseling to help you recover. °  Follow these instructions at home: Eating and drinking  Eat a heart-healthy diet that includes whole grains, fruits and vegetables, lean proteins, and  low-fat or nonfat dairy products.  Limit how much salt (sodium) you eat as told by your health care provider. Follow instructions from your health care provider about any other eating or drinking restrictions, such as limiting foods that are high in fat and processed sugars.  Use healthy cooking methods such as roasting, grilling, broiling, baking, poaching, steaming, or stir-frying.  Work with a dietitian to follow a heart-healthy eating plan. Medicines  Take over-the-counter and prescription medicines only as told by your health care provider.  Do not take these medicines unless your health care provider approves: ? Vitamin supplements that contain vitamin A or vitamin E. ? NSAIDs, such as ibuprofen, naproxen, or celecoxib. ? Hormone replacement therapy that contains estrogen.  If you are taking blood thinners: ? Talk with your health care provider before you take any medicines that contain aspirin or NSAIDs. These medicines increase your risk for dangerous bleeding. ? Take your medicine exactly as told, at the same time every day. ? Avoid activities that could cause injury or bruising, and follow instructions about how to prevent falls. ? Wear a medical alert bracelet, and carry a card that lists what medicines you take. Activity  Follow your cardiac rehabilitation program. Do exercises as told by your physical therapist.  Ask your health care provider what activities and exercises are safe for you. Follow his or her instructions about lifting, driving, or climbing stairs. Lifestyle  Do not use any products that contain nicotine or tobacco, such as cigarettes, e-cigarettes, and chewing tobacco. If you need help quitting, ask your health care provider.  Do not drink alcohol if your health care provider tells you not to drink.  If you drink alcohol: ? Limit how much you have to 0-1 drink a day. ? Be aware of how much alcohol is in your drink. In the [place] one drink equals one 12 oz  bottle of beer (355 mL), one 5 oz glass of wine (148 mL), or one 1 oz glass of hard liquor (44 mL).  Maintain a healthy weight. If you need to lose weight, work with your health care provider to do so safely. General instructions  Tell all the health care providers who provide care for you about your heart condition, including your dentist. This may affect the medicines or treatment you receive.  Manage any other health conditions you have, such as hypertension or diabetes. These conditions affect your heart.  Pay attention to your mental health. You may be at higher risk for depression. ? Find ways to manage stress. ? Talk to your health care provider about depression screening and treatment.  Keep your vaccinations up to date. ? Get the flu shot (influenza vaccine) every year. ? Get the pneumococcal vaccine if you are age 51 or older.  If directed, monitor your blood pressure at home.  Keep all follow-up visits as told by your health care provider. This is important. Contact a health care provider if you:  Feel overwhelmed or sad.  Have trouble doing your daily activities. Get help right away if you:  Have pain in your chest, neck, arm, jaw, stomach, or back that recurs, and: ? It lasts for more than a few minutes. ? It is not relieved by taking the [place] health care provider prescribed.  Have unexplained: ? Heavy sweating. ? Heartburn or indigestion. ? Nausea or vomiting. ?  Shortness of breath. ? Difficulty breathing. ? Fatigue. ? Nervousness or anxiety. ? Weakness. ? Diarrhea. ? Dark stools or blood in your stool.  Have sudden light-headedness or dizziness.  Have blood pressure that is higher than 180/120.  Faint.  Have thoughts about hurting yourself. These symptoms may represent a serious problem that is an emergency. Do not wait to see if the symptoms will go away. Get medical help right away. Call your local emergency services (911 in the [place] Do  not drive yourself to the hospital.  Summary  Acute coronary syndrome (ACS) is when there is not enough blood and oxygen being supplied to the heart. ACS can result in chest pain or a heart attack.  Acute coronary syndrome is a medical emergency. If you have any symptoms of this condition, get help right away.  Treatment includes medicines and procedures to open the blocked arteries and restore blood flow. This information is not intended to replace advice given to you by your health care provider. Make sure you discuss any questions you have with your health care provider. Document Released: 05/05/2005 Document Revised: 05/17/2018 Document Reviewed: 05/17/2018 Elsevier Patient Education  2020 [place].   Heart Failure, Diagnosis  Heart failure means that your heart is not able to pump blood in the right way. This makes it hard for your body to work well. Heart failure is usually a long-term (chronic) condition. You must take good care of yourself and follow your treatment plan from your doctor. What are the causes? This condition may be caused by:  High blood pressure.  Build up of cholesterol and fat in the arteries.  Heart attack. This injures the heart muscle.  Heart valves that do not open and close properly.  Damage of the heart muscle. This is also called cardiomyopathy.  Lung disease.  Abnormal heart rhythms. What increases the risk? The risk of heart failure goes up as a person ages. This condition is also more likely to develop in people who:  Are overweight.  Are male.  Smoke or chew tobacco.  Abuse alcohol or illegal drugs.  Have taken medicines that can damage the heart.  Have diabetes.  Have abnormal heart rhythms.  Have thyroid problems.  Have low blood counts (anemia). What are the signs or symptoms? Symptoms of this condition include:  Shortness of breath.  Coughing.  Swelling of the feet, ankles, legs, or belly.  Losing weight for no  reason.  Trouble breathing.  Waking from sleep because of the need to sit up and get more air.  Rapid heartbeat.  Being very tired.  Feeling dizzy, or feeling like you may pass out (faint).  Having no desire to eat.  Feeling like you may vomit (nauseous).  Peeing (urinating) more at night.  Feeling confused. How is this treated?     This condition may be treated with:  Medicines. These can be given to treat blood pressure and to make the heart muscles stronger.  Changes in your daily life. These may include eating a healthy diet, staying at a healthy body weight, quitting tobacco and illegal drug use, or doing exercises.  Surgery. Surgery can be done to open blocked valves, or to put devices in the heart, such as pacemakers.  A donor heart (heart transplant). You will receive a healthy heart from a donor. Follow these instructions at home:  Treat other conditions as told by your doctor. These may include high blood pressure, diabetes, thyroid disease, or abnormal heart rhythms.  Learn as much as you can about heart failure.  Get support as you need it.  Keep all follow-up visits as told by your doctor. This is important. Summary  Heart failure means that your heart is not able to pump blood in the right way.  This condition is caused by high blood pressure, heart attack, or damage of the heart muscle.  Symptoms of this condition include shortness of breath and swelling of the feet, ankles, legs, or belly. You may also feel very tired or feel like you may vomit.  You may be treated with medicines, surgery, or changes in your daily life.  Treat other health conditions as told by your doctor. This information is not intended to replace advice given to you by your health care provider. Make sure you discuss any questions you have with your health care provider. Document Released: 02/12/2008 Document Revised: 07/23/2018 Document Reviewed: 07/23/2018 Elsevier Patient  Education  [place].

## 2018-11-28 NOTE — Progress Notes (Signed)
Progress Note  Patient Name: Evan [name] Date of Encounter: 11/28/2018  Primary Cardiologist: Quay Burow, MD   Subjective   No recurrent chest pain. SOB improved over the last 24 hours. He reports good UOP with lasix. Only complaint is sleep interruptions.   Inpatient Medications    Scheduled Meds:  aspirin EC  81 mg Oral Daily   atorvastatin  80 mg Oral q1800   carvedilol  12.5 mg Oral BID WC   enoxaparin (LOVENOX) injection  40 mg Subcutaneous Q24H   famotidine  20 mg Oral BID   fluticasone  1 spray Each Nare BID   folic acid  1 mg Oral Daily   furosemide  20 mg Oral Daily   furosemide  40 mg Oral Daily   insulin aspart  0-9 Units Subcutaneous TID AC & HS   insulin detemir  30 Units Subcutaneous QHS   losartan  25 mg Oral Daily   sodium chloride flush  3 mL Intravenous Q12H   spironolactone  12.5 mg Oral Daily   thiamine  100 mg Oral Daily   ticagrelor  90 mg Oral BID   Continuous Infusions:  sodium chloride     PRN Meds: sodium chloride, acetaminophen, LORazepam, morphine injection, nitroGLYCERIN, ondansetron (ZOFRAN) IV, sodium chloride flush, sodium chloride flush, zolpidem   Vital Signs    Vitals:   11/27/18 1744 11/27/18 2127 11/28/18 0433 11/28/18 0843  BP: 123/82 112/83 108/71 109/79  Pulse: 81 83 85 88  Resp:  20 17   Temp:  98.1 F (36.7 C) 97.7 F (36.5 C)   TempSrc:  Oral Oral   SpO2:  99% 98%   Weight:   124.8 kg   Height:        Intake/Output Summary (Last 24 hours) at 11/28/2018 1012 Last data filed at 11/28/2018 0848 Gross per 24 hour  Intake 3 ml  Output --  Net 3 ml   Filed Weights   11/26/18 0544 11/27/18 0608 11/28/18 0433  Weight: 124 kg 125.4 kg 124.8 kg    Telemetry    Sinus rhythm with a couple episodes of NSVT  - Personally Reviewed  Twelve-lead EKG- sinus rhythm at 85 with Q waves L2 before and lateral T wave inversion.  I personally reviewed this EKG.  Physical Exam   GEN: Sitting on the edge  of the hospital bed in no acute distress.   Neck: No JVD, no carotid bruits Cardiac: RRR, no murmurs, rubs, or gallops.  Respiratory: Clear to auscultation bilaterally, no wheezes/ rales/ rhonchi GI: NABS, Soft, nontender, non-distended  MS: No edema; No deformity. Neuro:  Nonfocal, moving all extremities spontaneously Psych: Normal affect   Labs    Chemistry Recent Labs  Lab 11/24/18 [phone] 11/24/18 1101  11/26/18 0444 11/27/18 0510 11/28/18 0338  NA 142 142   < > 139 140 138  K 3.6 3.3*   < > 3.8 3.9 3.3*  CL 108 109   < > 105 106 105  CO2 22 22   < > 24 22 23   GLUCOSE 143* 116*   < > 117* 137* 118*  BUN 13 13   < > 18 19 17   CREATININE 1.06 0.92   < > 1.03 0.99 0.94  CALCIUM 9.3 8.7*   < > 8.2* 8.9 8.9  PROT 7.2 6.4*  --   --   --   --   ALBUMIN 3.9 3.6  --   --   --   --  AST 84* 109*  --   --   --   --   ALT 22 26  --   --   --   --   ALKPHOS 65 57  --   --   --   --   BILITOT 0.8 1.0  --   --   --   --   GFRNONAA >60 >60   < > >60 >60 >60  GFRAA >60 >60   < > >60 >60 >60  ANIONGAP 12 11   < > 10 12 10    < > = values in this interval not displayed.     Hematology Recent Labs  Lab 11/25/18 0431 11/26/18 0444 11/27/18 0510  WBC 11.4* 9.6 11.2*  RBC 4.81 4.66 4.90  HGB 14.7 14.1 15.0  HCT 44.8 43.4 45.8  MCV 93.1 93.1 93.5  MCH 30.6 30.3 30.6  MCHC 32.8 32.5 32.8  RDW 12.8 12.7 12.6  PLT 180 168 195    Cardiac EnzymesNo results for input(s): TROPONINI in the last 168 hours. No results for input(s): TROPIPOC in the last 168 hours.   BNP Recent Labs  Lab 11/24/18 1101  BNP 624.9*     DDimer No results for input(s): DDIMER in the last 168 hours.   Radiology    Dg Chest Port 1 View  Result Date: 11/27/2018 CLINICAL DATA:  Shortness of breath x 2-3 weeks. Pt states he felt he could not breathe last night. Hx of sleep apnea testing that did not get completed. Per pt has gotten stents placed while admitted here. Pt had a heart attack x last Tuesday.  Slight cough today. Hx of diabetes, HTN. EXAM: PORTABLE CHEST 1 VIEW COMPARISON:  11/24/2018 and older exams. FINDINGS: Cardiac silhouette is normal in size. No mediastinal or hilar masses. No evidence of adenopathy. Mild peripheral interstitial thickening in the lower lungs, stable. Lungs otherwise clear. No pleural effusion or pneumothorax. Skeletal structures are grossly intact. IMPRESSION: No acute cardiopulmonary disease. Electronically Signed   By: Lajean Manes M.D.   On: 11/27/2018 09:42    Cardiac Studies   CORONARY STENT INTERVENTION   11/25/2018  LEFT HEART CATH AND CORONARY ANGIOGRAPHY  Conclusion    Prox RCA to Mid RCA lesion is 95% stenosed.  Dist RCA lesion is 30% stenosed.  RPAV lesion is 100% stenosed.  Dist Cx lesion is 99% stenosed.  There is severe left ventricular systolic dysfunction.  LV end diastolic pressure is severely elevated.  The left ventricular ejection fraction is less than 25% by visual estimate.  Ost LAD to Prox LAD lesion is 100% stenosed.  Post intervention, there is a 0% residual stenosis.  A drug-eluting stent was successfully placed using a STENT RESOLUTE ONYX 3.5X22.  A drug-eluting stent was successfully placed using a Roland [id number].  Mid LAD-1 lesion is 30% stenosed.  Mid LAD-2 lesion is 100% stenosed.  Post intervention, there is a 0% residual stenosis.  1. Severe three-vessel coronary artery disease. Occluded ostial and mid LAD likely due to late presenting anterior STEMI. Significant distal left circumflex disease but the supplied territory is small. Significant mid RCA stenosis with occluded posterior AV groove with left-to-right collaterals. 2. Severely reduced LV systolic function with an EF of 20 to 25% with anterior wall akinesis. LVEDP was 32 mmHg. 3. Successful PCI and drug-eluting stent placement to the ostial as well as mid LAD.  Recommendations: The patient was given furosemide 40 mg IV after  the case. Continue furosemide  40 mg by mouth twice daily starting tomorrow. I started small dose carvedilol. If renal function remains stable, recommend adding a small dose ARB with plans to transition to [facility] as an outpatient. In addition, should consider spironolactone if blood pressure allows. Recommend dual antiplatelet therapy for at least 1 year. Recommend staged mid RCA PCI likely on Friday if the patient remains stable. The rest of his coronary artery disease should be treated medically.   Echo 11/24/2018 IMPRESSIONS   1. The left ventricle has severely reduced systolic function, with an ejection fraction of 25-30%. The cavity size was moderate to severely dilated. Left ventricular diastolic Doppler parameters are consistent with pseudonormalization. 2. There is akinesis of the entire apical myocardium, inferoseptal, anteroseptal and anterior myocardium. 3. The right ventricle has normal systolic function. The cavity was normal. There is no increase in right ventricular wall thickness. Right ventricular systolic pressure could not be assessed. 4. Left atrial size was moderately dilated. 5. The interatrial septum appears to be lipomatous.   Patient Profile     Davanta W Martinis a 53 y.o.malewith a history of hypertension, hyperlipidemia, diabetes mellitus, and obesity transferred from [facility] for further evaluation and treatment of non-STEMI.   His EKG shows Q waves across the precordium out of the 4 with biphasic ST segments suggesting an evolving anterior myocardial infarction.   Assessment & Plan    1. Late presenting anterior STEMI: patient [facility] with chest pain x1-2 weeks. Trop peaked at [address]. He underwent LHC 11/24/2018 which showed severe 3 vessel CAD s/p PCI/DES to ostial and mLAD, he underwent staged mid RCA PCI and stenting by Dr. Fletcher Anon .  He did demonstrate that the LAD was widely patent.  No recurrent chest  pain - Continue aspirin, brilinta, statin, and BBlocker   2. Acute systolic CHF/ischemic cardiomyopathy: Echo showed EF 25-30%. Patient was given IV lasix x1 dose and transitioned to po 40mg  daily. UOP with incomplete documentation. Weight 283lbs>273lbs. No significant volume overload on exam. Patient was started on BBlocker and spironolactone. LifeVest also ordered for primary prevention.   - Continue carvedilol, spironolactone, and lasix - Will start losartan 25 mg a day.  I do not think he will be able to afford Entresto.  Follow-up with advanced heart failure clinic..   3. HTN: BP improved on carvedilol and spironolactone - Continue current regimen - Will plan to initiate entresto prior to discharge if BP will allow  4. HLD: LDL 95 this admission.  - Continue atorvastatin  5. DM type 2: A1C 6.9; at goal of <7. - Continue jardiance and insulin per primary team  6. NSVT: patient denies palpitations. K 3.3 this AM.  - Continue aggressive electrolyte repletion to maintain K>4, Mg>2  The patient has had education about LifeVest and understands its use.  His chest x-ray shows some interstitial edema.  He was given IV Lasix.  Placed him on p.o. Lasix in addition of spironolactone.  Electrolytes are stable.  Basic metabolic panel shows serum potassium of 3.3.  Will need repletion prior to discharge as well as home on maintenance potassium.  We will arrange outpatient follow-up with an APP in 7 days and with me in 3 to 4 weeks.  He will need follow-up 2D echocardiography in 3 months to assess improvement LV function.   For questions or updates, please contact [place] Please consult [email or web address] for contact info under Cardiology/STEMI.  I did increase his carvedilol from 3.125 to 6.25 mg p.o. twice daily.  Signed, Quay Burow, MD  11/28/2018, 10:12 AM   [phone]

## 2018-11-28 NOTE — Plan of Care (Signed)
°  Problem: Education: °Goal: Understanding of cardiac disease, CV risk reduction, and recovery process will improve °Outcome: Progressing °Goal: Individualized Educational Video(s) °Outcome: Progressing °  °

## 2018-11-28 NOTE — Plan of Care (Signed)
  Problem: Education: Goal: Understanding of cardiac disease, CV risk reduction, and recovery process will improve Outcome: Adequate for Discharge Goal: Individualized Educational Video(s) Outcome: Adequate for Discharge   Problem: Activity: Goal: Ability to tolerate increased activity will improve Outcome: Adequate for Discharge   Problem: Cardiac: Goal: Ability to achieve and maintain adequate cardiovascular perfusion will improve Outcome: Adequate for Discharge   Problem: Health Behavior/Discharge Planning: Goal: Ability to safely manage health-related needs after discharge will improve Outcome: Adequate for Discharge   Problem: Health Behavior/Discharge Planning: Goal: Ability to manage health-related needs will improve Outcome: Adequate for Discharge   Problem: Clinical Measurements: Goal: Ability to maintain clinical measurements within normal limits will improve Outcome: Adequate for Discharge Goal: Will remain free from infection Outcome: Adequate for Discharge Goal: Diagnostic test results will improve Outcome: Adequate for Discharge Goal: Respiratory complications will improve Outcome: Adequate for Discharge Goal: Cardiovascular complication will be avoided Outcome: Adequate for Discharge   Problem: Education: Goal: Understanding of cardiac disease, CV risk reduction, and recovery process will improve Outcome: Adequate for Discharge Goal: Understanding of medication regimen will improve Outcome: Adequate for Discharge Goal: Individualized Educational Video(s) Outcome: Adequate for Discharge   Problem: Activity: Goal: Ability to tolerate increased activity will improve Outcome: Adequate for Discharge   Problem: Cardiac: Goal: Ability to achieve and maintain adequate cardiopulmonary perfusion will improve Outcome: Adequate for Discharge Goal: Vascular access site(s) Level 0-1 will be maintained Outcome: Adequate for Discharge   Problem: Health  Behavior/Discharge Planning: Goal: Ability to safely manage health-related needs after discharge will improve Outcome: Adequate for Discharge

## 2018-11-29 ENCOUNTER — Telehealth: Payer: Self-pay | Admitting: Family Medicine

## 2018-11-29 LAB — POCT ACTIVATED CLOTTING TIME: Activated Clotting Time: 312 seconds

## 2018-11-29 NOTE — Telephone Encounter (Signed)
appt scheduled Pt notified 

## 2018-12-02 ENCOUNTER — Encounter: Payer: Self-pay | Admitting: Family Medicine

## 2018-12-02 ENCOUNTER — Ambulatory Visit (INDEPENDENT_AMBULATORY_CARE_PROVIDER_SITE_OTHER): Payer: Medicaid Other | Admitting: Family Medicine

## 2018-12-02 DIAGNOSIS — I25110 Atherosclerotic heart disease of native coronary artery with unstable angina pectoris: Secondary | ICD-10-CM

## 2018-12-02 DIAGNOSIS — M1A0720 Idiopathic chronic gout, left ankle and foot, without tophus (tophi): Secondary | ICD-10-CM

## 2018-12-02 DIAGNOSIS — F1120 Opioid dependence, uncomplicated: Secondary | ICD-10-CM

## 2018-12-02 DIAGNOSIS — Z0289 Encounter for other administrative examinations: Secondary | ICD-10-CM | POA: Diagnosis not present

## 2018-12-02 MED ORDER — HYDROCODONE-ACETAMINOPHEN 7.5-325 MG PO TABS: 0.5000 | ORAL_TABLET | Freq: Every day | ORAL | 0 refills | Status: DC | PRN

## 2018-12-02 MED ORDER — JARDIANCE 25 MG PO TABS: 25.0000 mg | ORAL_TABLET | Freq: Every day | ORAL | 6 refills | Status: DC

## 2018-12-02 NOTE — Addendum Note (Signed)
Addended by: Liliane Bade on: 12/02/2018 11:40 AM   Modules accepted: Orders

## 2018-12-02 NOTE — Progress Notes (Signed)
Virtual Visit via telephone Note  I connected with Evan [name] on 12/02/18 at 1023 by telephone and verified that I am speaking with the correct person using two identifiers. Evan [name] is currently located at home and no other people are currently with her during visit. The provider, Fransisca Kaufmann Dettinger, MD is located in their office at time of visit.  Call ended at 1047  I discussed the limitations, risks, security and privacy concerns of performing an evaluation and management service by telephone and the availability of in person appointments. I also discussed with the patient that there may be a patient responsible charge related to this service. The patient expressed understanding and agreed to proceed.   History and Present Illness: Patient is calling in for hospital f/u for Nstemi and stents.  He was in the hospital from 11/24/18 until 11/28/2018.  Since leaving the hospital he has been watching his weight.  He was also diagnosed with CHF.  They gave him a restricted sodium diet and has f/u with cardiology.  His weight was 267 down from 272 yesterday. He is wearing an external defibrillator.  He has appt with cardiology on the 27th of this month.  He is having back pain but denies any chest pain or pressure.  He was having a little breathing issue and feels like things get stuck in his throat.  He wakes up try to catching his breath at times.  He says more of his short of breath is associated with sleeping, he said they are he did a referral for sleep apnea  Pain assessment: Cause of pain-back pain after hospitalization, also associated with him wearing the vest which is worse than his normal back pain and gout Pain location-upper middle back Pain on scale of 1-10-3 Frequency-all of the time, he was having to wear the vest and not sleeping well because of it for the defibrillator What increases pain-wearing the vest and prolonged resting What makes pain Better-pain medication and  movement Effects on ADL -does not limit him but does cause him a lot of pain when doing things Any change in general medical condition-patient recently had a myocardial infarction and had stenting in the hospital  Current opioids rx-Norco 7.5-325 # meds rx-45 Effectiveness of current meds-work well, he is only needing a little bit more now because of the vest that he has to wear because of recent hospitalization and having pain in his upper middle back Adverse reactions form pain meds-none Morphine equivalent-3.75  Pill count performed-No Last drug screen -10/08/2018 ( high risk q35m moderate risk q640mlow risk yearly ) Urine drug screen today- No Was the [place]yes  If yes were their any concerning findings? -None  No flowsheet data found.   Pain contract signed on: 10/06/2018  No diagnosis found.  Outpatient Encounter Medications as of 12/02/2018  Medication Sig  . ACCU-CHEK AVIVA PLUS test strip USE TO CHECK SUGAR TWICE DAILY  . aspirin EC 81 MG tablet Take 81 mg by mouth daily.  . Marland Kitchentorvastatin (LIPITOR) 80 MG tablet Take 1 tablet (80 mg total) by mouth daily at 6 PM.  . BD PEN NEEDLE NANO U/F 32G X 4 MM MISC USE DAILY WITH INSULIN  . carvedilol (COREG) 12.5 MG tablet Take 1 tablet (12.5 mg total) by mouth 2 (two) times daily with a meal.  . famotidine (PEPCID) 20 MG tablet Take 1 tablet (20 mg total) by mouth 2 (two) times daily.  . fenofibrate (TRICOR) 145 MG  tablet TAKE 1 TABLET BY MOUTH EVERY DAY  . fluticasone (FLONASE) 50 MCG/ACT nasal spray Place 1 spray into both nostrils 2 (two) times a day.  . furosemide (LASIX) 20 MG tablet Take 1 tablet (20 mg total) by mouth daily.  Marland Kitchen HYDROcodone-acetaminophen (NORCO) 7.5-325 MG tablet Take 1 tablet by mouth every 8 (eight) hours as needed for moderate pain or severe pain. (Patient taking differently: Take 0.5 tablets by mouth daily as needed for moderate pain or severe pain. )  . Insulin Detemir (LEVEMIR FLEXTOUCH) 100  UNIT/ML Pen INJECT 30 UNITS DAILY AT BEDTIME - UP TO 50 UNITS AS NEEDED  . JARDIANCE 25 MG TABS tablet Take 25 mg by mouth daily.  Marland Kitchen losartan (COZAAR) 25 MG tablet Take 1 tablet (25 mg total) by mouth daily.  . nitroGLYCERIN (NITROSTAT) 0.4 MG SL tablet Place 1 tablet (0.4 mg total) under the tongue every 5 (five) minutes x 3 doses as needed for chest pain.  . Omega-3 Fatty Acids (FISH OIL) 1000 MG CAPS Take 2,000 mg by mouth daily.  . potassium chloride (K-DUR) 10 MEQ tablet Take 1 tablet (10 mEq total) by mouth daily.  Marland Kitchen spironolactone (ALDACTONE) 25 MG tablet Take 0.5 tablets (12.5 mg total) by mouth daily.  . ticagrelor (BRILINTA) 90 MG TABS tablet Take 1 tablet (90 mg total) by mouth 2 (two) times daily.   No facility-administered encounter medications on file as of 12/02/2018.     Review of Systems  Constitutional: Negative for chills and fever.  Respiratory: Positive for chest tightness and shortness of breath. Negative for wheezing.   Cardiovascular: Negative for chest pain and leg swelling.  Gastrointestinal: Negative for abdominal pain.  Musculoskeletal: Positive for arthralgias and back pain. Negative for gait problem.  Skin: Negative for rash.  Neurological: Negative for dizziness, weakness and light-headedness.  All other systems reviewed and are negative.   Observations/Objective: Patient sounds comfortable and in no acute distress  Assessment and Plan: Problem List Items Addressed This Visit      Other   Gout   Relevant Medications   HYDROcodone-acetaminophen (NORCO) 7.5-325 MG tablet   Pain medication agreement signed   Relevant Medications   HYDROcodone-acetaminophen (NORCO) 7.5-325 MG tablet   Opiate dependence ([place])   Relevant Medications   HYDROcodone-acetaminophen (NORCO) 7.5-325 MG tablet    Other Visit Diagnoses    Coronary artery disease involving native coronary artery of native heart with unstable angina pectoris ([place])    -  Primary   Relevant Orders    CBC with Differential/Platelet   CMP14+EGFR      Sounds like patient needs to go for sleep apnea testing, it sounds like cardiology already did this referral from the hospital but if they did not then we will need to put that in in the future.  Follow Up Instructions:  Patient needs follow-up in 1 month around August 22 for his usual diabetes check  Call cardiology or go to the emergency department if symptoms persist or worsen   I discussed the assessment and treatment plan with the patient. The patient was provided an opportunity to ask questions and all were answered. The patient agreed with the plan and demonstrated an understanding of the instructions.   The patient was advised to call back or seek an in-person evaluation if the symptoms worsen or if the condition fails to improve as anticipated.  The above assessment and management plan was discussed with the patient. The patient verbalized understanding of and has agreed to  the management plan. Patient is aware to call the clinic if symptoms persist or worsen. Patient is aware when to return to the clinic for a follow-up visit. Patient educated on when it is appropriate to go to the emergency department.    I provided 24 minutes of non-face-to-face time during this encounter.    Worthy Rancher, MD

## 2018-12-03 DIAGNOSIS — I2109 ST elevation (STEMI) myocardial infarction involving other coronary artery of anterior wall: Secondary | ICD-10-CM | POA: Diagnosis not present

## 2018-12-03 DIAGNOSIS — I420 Dilated cardiomyopathy: Secondary | ICD-10-CM | POA: Diagnosis not present

## 2018-12-03 LAB — CBC WITH DIFFERENTIAL/PLATELET
Basophils Absolute: 0.1 10*3/uL (ref 0.0–0.2)
Basos: 1 %
EOS (ABSOLUTE): 0.2 10*3/uL (ref 0.0–0.4)
Eos: 2 %
Hematocrit: 43.1 % (ref 37.5–51.0)
Hemoglobin: 14.3 g/dL (ref 13.0–17.7)
Immature Grans (Abs): 0 10*3/uL (ref 0.0–0.1)
Immature Granulocytes: 0 %
Lymphocytes Absolute: 1.4 10*3/uL (ref 0.7–3.1)
Lymphs: 14 %
MCH: 29.7 pg (ref 26.6–33.0)
MCHC: 33.2 g/dL (ref 31.5–35.7)
MCV: 90 fL (ref 79–97)
Monocytes Absolute: 0.9 10*3/uL (ref 0.1–0.9)
Monocytes: 9 %
Neutrophils Absolute: 7.4 10*3/uL — ABNORMAL HIGH (ref 1.4–7.0)
Neutrophils: 74 %
Platelets: 219 10*3/uL (ref 150–450)
RBC: 4.81 x10E6/uL (ref 4.14–5.80)
RDW: 12.4 % (ref 11.6–15.4)
WBC: 9.9 10*3/uL (ref 3.4–10.8)

## 2018-12-03 LAB — CMP14+EGFR
ALT: 22 IU/L (ref 0–44)
AST: 13 IU/L (ref 0–40)
Albumin/Globulin Ratio: 2.1 (ref 1.2–2.2)
Albumin: 4.7 g/dL (ref 3.8–4.9)
Alkaline Phosphatase: 79 IU/L (ref 39–117)
BUN/Creatinine Ratio: 22 — ABNORMAL HIGH (ref 9–20)
BUN: 24 mg/dL (ref 6–24)
Bilirubin Total: 0.5 mg/dL (ref 0.0–1.2)
CO2: 23 mmol/L (ref 20–29)
Calcium: 9.1 mg/dL (ref 8.7–10.2)
Chloride: 100 mmol/L (ref 96–106)
Creatinine, Ser: 1.08 mg/dL (ref 0.76–1.27)
GFR calc Af Amer: 90 mL/min/{1.73_m2} (ref >59)
GFR calc non Af Amer: 78 mL/min/{1.73_m2} (ref >59)
Globulin, Total: 2.2 g/dL (ref 1.5–4.5)
Glucose: 162 mg/dL — ABNORMAL HIGH (ref 65–99)
Potassium: 4.1 mmol/L (ref 3.5–5.2)
Sodium: 141 mmol/L (ref 134–144)
Total Protein: 6.9 g/dL (ref 6.0–8.5)

## 2018-12-06 ENCOUNTER — Telehealth: Payer: Self-pay | Admitting: Cardiovascular Disease

## 2018-12-06 NOTE — Telephone Encounter (Signed)
Patient had procedure with [facility] on 7/8 and 7/10. Since then patient still having back pain and shortness of breath after walking 10-15 ft. Has spoken to PCP and he recommended to call [facility] [place]. Patient had blood work done Thursday. Please advise

## 2018-12-07 NOTE — Telephone Encounter (Signed)
Spoke with pt who report since Cath procedure on 7/10, he's been having c/o of constant back pain and SOB after walking about 5-10 ft. He denies chest pain or swelling but report he feels like he can't catch a good breath after walking. He also report if he bends over and try to sit back up, he becomes SOB as well.   Pt has a hospital f/u with Kerin Ransom, [place] on 7/27 at 1:30 pm. Will route to PA for recommendations.

## 2018-12-08 NOTE — Telephone Encounter (Signed)
   Pt made aware of visitor's policy and informed to wear a mask. Pt voiced understanding.  COVID-19 Pre-Screening Questions:  . In the past 7 to 10 days have you had a cough,  shortness of breath, headache, congestion, fever (100 or greater) body aches, chills, sore throat, or sudden loss of taste or sense of smell? SOB . Have you been around anyone with known Covid 19. No . Have you been around anyone who is awaiting Covid 19 test results in the past 7 to 10 days? No . Have you been around anyone who has been exposed to Covid 19, or has mentioned symptoms of Covid 19 within the past 7 to 10 days? No  If you have any concerns/questions about symptoms patients report during screening (either on the phone or at threshold). Contact the provider seeing the patient or DOD for further guidance.  If neither are available contact a member of the leadership team.

## 2018-12-08 NOTE — Telephone Encounter (Signed)
Follow up  ° ° °Patient is returning call.  °

## 2018-12-08 NOTE — Telephone Encounter (Signed)
Left message to call back  

## 2018-12-08 NOTE — Telephone Encounter (Signed)
Please add to my schedule tomorrow-I believe I have an opening.  If he is uncomfortable and SOB at rest he should go to the ED.  Kerin Ransom PA-C 12/08/2018 8:01 AM

## 2018-12-09 ENCOUNTER — Encounter: Payer: Self-pay | Admitting: Cardiology

## 2018-12-09 ENCOUNTER — Ambulatory Visit: Payer: Medicaid Other | Admitting: Cardiology

## 2018-12-09 ENCOUNTER — Other Ambulatory Visit: Payer: Self-pay

## 2018-12-09 VITALS — BP 92/64 | HR 68 | Ht 76.5 in | Wt 271.6 lb

## 2018-12-09 DIAGNOSIS — E118 Type 2 diabetes mellitus with unspecified complications: Secondary | ICD-10-CM | POA: Diagnosis not present

## 2018-12-09 DIAGNOSIS — E1169 Type 2 diabetes mellitus with other specified complication: Secondary | ICD-10-CM

## 2018-12-09 DIAGNOSIS — I2510 Atherosclerotic heart disease of native coronary artery without angina pectoris: Secondary | ICD-10-CM

## 2018-12-09 DIAGNOSIS — Z9861 Coronary angioplasty status: Secondary | ICD-10-CM | POA: Diagnosis not present

## 2018-12-09 DIAGNOSIS — I255 Ischemic cardiomyopathy: Secondary | ICD-10-CM | POA: Diagnosis not present

## 2018-12-09 DIAGNOSIS — E785 Hyperlipidemia, unspecified: Secondary | ICD-10-CM | POA: Diagnosis not present

## 2018-12-09 DIAGNOSIS — I214 Non-ST elevation (NSTEMI) myocardial infarction: Secondary | ICD-10-CM | POA: Diagnosis not present

## 2018-12-09 DIAGNOSIS — Z794 Long term (current) use of insulin: Secondary | ICD-10-CM

## 2018-12-09 DIAGNOSIS — I5021 Acute systolic (congestive) heart failure: Secondary | ICD-10-CM

## 2018-12-09 DIAGNOSIS — IMO0001 Reserved for inherently not codable concepts without codable children: Secondary | ICD-10-CM

## 2018-12-09 MED ORDER — LOSARTAN POTASSIUM 25 MG PO TABS: 25.0000 mg | ORAL_TABLET | Freq: Every day | ORAL | 1 refills | Status: DC

## 2018-12-09 NOTE — Assessment & Plan Note (Signed)
EF 20-25%- discharged with Life Vest

## 2018-12-09 NOTE — Assessment & Plan Note (Signed)
LAD PCI/DES x 2 11/24/2018 Staged RCA PCI/DES 11/26/2018 Residual 99% dCFX

## 2018-12-09 NOTE — Patient Instructions (Addendum)
Medication Instructions:  TAKE LOSARTAN EVERY EVENING  If you need a refill on your cardiac medications before your next appointment, please call your pharmacy.   Lab work: NONE If you have labs (blood work) drawn today and your tests are completely normal, you will receive your results only by: Marland Kitchen MyChart Message (if you have MyChart) OR . A paper copy in the mail If you have any lab test that is abnormal or we need to change your treatment, we will call you to review the results.  Testing/Procedures: NONE  Follow-Up: At [facility], you and your health needs are our priority.  As part of our continuing mission to provide you with exceptional heart care, we have created designated Provider Care Teams.  These Care Teams include your primary Cardiologist (physician) and Advanced Practice Providers (APPs -  Physician Assistants and Nurse Practitioners) who all work together to provide you with the care you need, when you need it.  Marland Kitchen [facility]KE RECOMMENDS YOU FOLLOW UP  WITH DR Gwenlyn Found AS SCHEDULED  Any Other Special Instructions Will Be Listed Below (If Applicable).

## 2018-12-09 NOTE — Assessment & Plan Note (Signed)
IDDM with CAD

## 2018-12-09 NOTE — Assessment & Plan Note (Signed)
Discharged on high dose statin Rx

## 2018-12-09 NOTE — Progress Notes (Signed)
Cardiology Office Note:    Date:  12/09/2018   ID:  Evan [name], DOB 09-21-1965, MRN [id number]  PCP:  Dettinger, Fransisca Kaufmann, MD  Cardiologist:  Quay Burow, MD  Electrophysiologist:  None   Referring MD: Dettinger, Fransisca Kaufmann, MD   Chief Complaint  Patient presents with  . Follow-up  Back pain-DOE s/p MI-PCI  History of Present Illness:    Evan [name] is a 53 y.o. male with a hx of DM and a FM Hx of CAD. He says he was chasing his dog a few days prior to admission and he had chest pain.  He felt progressively weaker and anorexic as well as having intermittent chest pain over the next couple of days.   He finally presented to the ED 11/24/2018.  EKG c/w anterior MI.  Cath revealed occluded pLAD, 99% mCFX, 95% RCA with L-R collaterals.  He underwent proximal and mid LAD PCI.  EF was 20-25%.  Hospital course complicated by CHF and he diuresed from 283 lbs to 273 lbs at discharge. He was brought back to the lab 11/26/2018 and had mRCA PCI with DES.  Post PCI he had NSVT.  He was discharged 11/28/2018 with Life Vest and on medical Rx.   Since discharge he has noted some mid back pain and DOE.  He was seen by his PCP 12/02/2018 and was doing well.  He called 7/20 c/o mid back pain and DOE and was added to my schedule today.  On exam he appears volume stable.  He denies orthopnea.  He has what sounds like muscular back pain, better with stretching.    Past Medical History:  Diagnosis Date  . Arthritis   . Diabetes mellitus without complication ([place])   . Gout   . Hyperlipidemia   . Hypertension   . Morbid obesity ([place]) 12/22/2016    Past Surgical History:  Procedure Laterality Date  . ANKLE ARTHROSCOPY Right   . CORONARY STENT INTERVENTION N/A 11/24/2018   Procedure: CORONARY STENT INTERVENTION;  Surgeon: Wellington Hampshire, MD;  Location: [facility];  Service: Cardiovascular;  Laterality: N/A;  prox and mid LAD  . CORONARY STENT INTERVENTION N/A 11/26/2018   Procedure: CORONARY  STENT INTERVENTION;  Surgeon: Wellington Hampshire, MD;  Location: [facility];  Service: Cardiovascular;  Laterality: N/A;  . CYST REMOVAL HAND    . KNEE ARTHROSCOPY Left   . LEFT HEART CATH AND CORONARY ANGIOGRAPHY N/A 11/24/2018   Procedure: LEFT HEART CATH AND CORONARY ANGIOGRAPHY;  Surgeon: Wellington Hampshire, MD;  Location: [facility];  Service: Cardiovascular;  Laterality: N/A;    Current Medications: Current Meds  Medication Sig  . ACCU-CHEK AVIVA PLUS test strip USE TO CHECK SUGAR TWICE DAILY  . aspirin EC 81 MG tablet Take 81 mg by mouth daily.  Marland Kitchen atorvastatin (LIPITOR) 80 MG tablet Take 1 tablet (80 mg total) by mouth daily at 6 PM.  . BD PEN NEEDLE NANO U/F 32G X 4 MM MISC USE DAILY WITH INSULIN  . carvedilol (COREG) 12.5 MG tablet Take 1 tablet (12.5 mg total) by mouth 2 (two) times daily with a meal.  . famotidine (PEPCID) 20 MG tablet Take 1 tablet (20 mg total) by mouth 2 (two) times daily.  . fenofibrate (TRICOR) 145 MG tablet TAKE 1 TABLET BY MOUTH EVERY DAY  . fluticasone (FLONASE) 50 MCG/ACT nasal spray Place 1 spray into both nostrils 2 (two) times a day.  . furosemide (LASIX)  20 MG tablet Take 1 tablet (20 mg total) by mouth daily.  Marland Kitchen HYDROcodone-acetaminophen (NORCO) 7.5-325 MG tablet Take 0.5 tablets by mouth daily as needed for moderate pain or severe pain.  . Insulin Detemir (LEVEMIR FLEXTOUCH) 100 UNIT/ML Pen INJECT 30 UNITS DAILY AT BEDTIME - UP TO 50 UNITS AS NEEDED  . JARDIANCE 25 MG TABS tablet Take 25 mg by mouth daily.  Marland Kitchen losartan (COZAAR) 25 MG tablet Take 1 tablet (25 mg total) by mouth at bedtime.  . nitroGLYCERIN (NITROSTAT) 0.4 MG SL tablet Place 1 tablet (0.4 mg total) under the tongue every 5 (five) minutes x 3 doses as needed for chest pain.  . Omega-3 Fatty Acids (FISH OIL) 1000 MG CAPS Take 2,000 mg by mouth daily.  . potassium chloride (K-DUR) 10 MEQ tablet Take 1 tablet (10 mEq total) by mouth daily.  Marland Kitchen spironolactone (ALDACTONE) 25 MG  tablet Take 0.5 tablets (12.5 mg total) by mouth daily.  . ticagrelor (BRILINTA) 90 MG TABS tablet Take 1 tablet (90 mg total) by mouth 2 (two) times daily.  . [DISCONTINUED] losartan (COZAAR) 25 MG tablet Take 1 tablet (25 mg total) by mouth daily.     Allergies:   Patient has no known allergies.   Social History   Socioeconomic History  . Marital status: Single    Spouse name: Not on file  . Number of children: Not on file  . Years of education: Not on file  . Highest education level: Not on file  Occupational History  . Not on file  Social Needs  . Financial resource strain: Not on file  . Food insecurity    Worry: Not on file    Inability: Not on file  . Transportation needs    Medical: Not on file    Non-medical: Not on file  Tobacco Use  . Smoking status: Never Smoker  . Smokeless tobacco: Never Used  Substance and Sexual Activity  . Alcohol use: Yes    Alcohol/week: 12.0 standard drinks    Types: 12 Cans of beer per week  . Drug use: No  . Sexual activity: Yes    Comment: male 8 months partner  Lifestyle  . Physical activity    Days per week: Not on file    Minutes per session: Not on file  . Stress: Not on file  Relationships  . Social Herbalist on phone: Not on file    Gets together: Not on file    Attends religious service: Not on file    Active member of club or organization: Not on file    Attends meetings of clubs or organizations: Not on file    Relationship status: Not on file  Other Topics Concern  . Not on file  Social History Narrative  . Not on file     Family History: The patient's family history includes Alcohol abuse in his father; Diabetes in his mother; Early death in his father and paternal grandfather; Heart attack in his father, maternal grandfather, and paternal grandfather; Heart disease in his father; Hypertension in his brother; Stroke in his mother. There is no history of Colon cancer, Esophageal cancer, Rectal  cancer, or Stomach cancer.  ROS:   Please see the history of present illness.     All other systems reviewed and are negative.  EKGs/Labs/Other Studies Reviewed:    The following studies were reviewed today: Cath PCI 11/24/2018 Cath PCI 11/26/2018 Echo 11/24/2018  EKG:  EKG is ordered  today.  The ekg ordered today demonstrates NSR-HR 68, anterior lateral TWI and septal Qs  Recent Labs: 11/24/2018: B Natriuretic Peptide 624.9; TSH 1.022 11/26/2018: Magnesium 2.0 12/02/2018: ALT 22; BUN 24; Creatinine, Ser 1.08; Hemoglobin 14.3; Platelets 219; Potassium 4.1; Sodium 141  Recent Lipid Panel    Component Value Date/Time   CHOL 158 11/25/2018 0431   CHOL 169 04/07/2018 1034   TRIG 156 (H) 11/25/2018 0431   HDL 32 (L) 11/25/2018 0431   HDL 31 (L) 04/07/2018 1034   CHOLHDL 4.9 11/25/2018 0431   VLDL 31 11/25/2018 0431   LDLCALC 95 11/25/2018 0431   LDLCALC 101 (H) 04/07/2018 1034    Physical Exam:    VS:  BP 92/64   Pulse 68   Ht 6' 4.5" (1.943 m)   Wt 271 lb 9.6 oz (123.2 kg)   SpO2 99%   BMI 32.63 kg/m     Wt Readings from Last 3 Encounters:  12/09/18 271 lb 9.6 oz (123.2 kg)  11/28/18 275 lb 1.6 oz (124.8 kg)  10/08/18 282 lb 12.8 oz (128.3 kg)     GEN:  Well nourished, well developed in no acute distress HEENT: Normal NECK: No JVD; No carotid bruits LYMPHATICS: No lymphadenopathy CARDIAC: RRR, no murmurs, rubs, gallops RESPIRATORY:  Clear to auscultation without rales, wheezing or rhonchi  ABDOMEN: Soft, non-tender, non-distended MUSCULOSKELETAL:  No edema; No deformity  SKIN: Warm and dry NEUROLOGIC:  Alert and oriented x 3 PSYCHIATRIC:  Normal affect   ASSESSMENT:    NSTEMI (non-ST elevated myocardial infarction) (HCC) Pt was a late presentation with anterior lateral MI 11/24/2018  CAD S/P PCI LAD PCI/DES x 2 11/24/2018 Staged RCA PCI/DES 11/26/2018 Residual 99% dCFX  Acute systolic (congestive) heart failure (HCC) Diuresed 10 lbs during his admission 283 lbs-  273 lbs  Ischemic cardiomyopathy EF 20-25%- discharged with Life Vest  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  His DOE could be secondary to Brilinta but also could be secondary to severe LVD.  His B/P is borderline low.  I suggested he try taking his Cozaar at night.  He has a f/u with Dr Gwenlyn Found in two weeks and will keep this.  Consider Entresto if his B/P is stable though he will require medication assistance to afford it.  Check echo in 3 months (not ordered yet).  Continue Life Vest.    Medication Adjustments/Labs and Tests Ordered: Current medicines are reviewed at length with the patient today.  Concerns regarding medicines are outlined above.  Orders Placed This Encounter  Procedures  . EKG 12-Lead   Meds ordered this encounter  Medications  . losartan (COZAAR) 25 MG tablet    Sig: Take 1 tablet (25 mg total) by mouth at bedtime.    Dispense:  30 tablet    Refill:  1    Patient Instructions  Medication Instructions:  TAKE LOSARTAN EVERY EVENING  If you need a refill on your cardiac medications before your next appointment, please call your pharmacy.   Lab work: NONE If you have labs (blood work) drawn today and your tests are completely normal, you will receive your results only by: Marland Kitchen MyChart Message (if you have MyChart) OR . A paper copy in the mail If you have any lab test that  is abnormal or we need to change your treatment, we will call you to review the results.  Testing/Procedures: NONE  Follow-Up: At [facility], you and your health needs are our priority.  As part of our continuing mission to provide you with exceptional heart care, we have created designated Provider Care Teams.  These Care Teams include your primary  Cardiologist (physician) and Advanced Practice Providers (APPs -  Physician Assistants and Nurse Practitioners) who all work together to provide you with the care you need, when you need it.  Marland Kitchen Marticia Reifschneider RECOMMENDS YOU FOLLOW UP  WITH DR Gwenlyn Found AS SCHEDULED  Any Other Special Instructions Will Be Listed Below (If Applicable).      Signed, Kerin Ransom, PA-C  12/09/2018 9:25 AM    [facility]

## 2018-12-09 NOTE — Assessment & Plan Note (Signed)
Pt was a late presentation with anterior lateral MI 11/24/2018

## 2018-12-09 NOTE — Assessment & Plan Note (Signed)
Diuresed 10 lbs during his admission 283 lbs- 273 lbs

## 2018-12-13 ENCOUNTER — Ambulatory Visit: Payer: Medicaid Other | Admitting: Cardiology

## 2018-12-16 ENCOUNTER — Encounter: Payer: Self-pay | Admitting: *Deleted

## 2018-12-16 ENCOUNTER — Telehealth: Payer: Self-pay | Admitting: Family Medicine

## 2018-12-16 NOTE — Telephone Encounter (Signed)
Yes I am fine with a go ahead and put in the nutrition referral, diagnosis diabetes

## 2018-12-17 ENCOUNTER — Other Ambulatory Visit: Payer: Self-pay | Admitting: *Deleted

## 2018-12-17 DIAGNOSIS — Z794 Long term (current) use of insulin: Secondary | ICD-10-CM

## 2018-12-17 DIAGNOSIS — E669 Obesity, unspecified: Secondary | ICD-10-CM

## 2018-12-17 DIAGNOSIS — E119 Type 2 diabetes mellitus without complications: Secondary | ICD-10-CM

## 2018-12-17 DIAGNOSIS — IMO0002 Reserved for concepts with insufficient information to code with codable children: Secondary | ICD-10-CM

## 2018-12-17 DIAGNOSIS — E11610 Type 2 diabetes mellitus with diabetic neuropathic arthropathy: Secondary | ICD-10-CM

## 2018-12-17 DIAGNOSIS — I25110 Atherosclerotic heart disease of native coronary artery with unstable angina pectoris: Secondary | ICD-10-CM

## 2018-12-17 DIAGNOSIS — E1169 Type 2 diabetes mellitus with other specified complication: Secondary | ICD-10-CM

## 2018-12-17 DIAGNOSIS — E1142 Type 2 diabetes mellitus with diabetic polyneuropathy: Secondary | ICD-10-CM

## 2018-12-17 NOTE — Progress Notes (Signed)
nut

## 2018-12-17 NOTE — Telephone Encounter (Signed)
Nutrition Referral placed

## 2018-12-18 DIAGNOSIS — I2109 ST elevation (STEMI) myocardial infarction involving other coronary artery of anterior wall: Secondary | ICD-10-CM | POA: Diagnosis not present

## 2018-12-18 DIAGNOSIS — I420 Dilated cardiomyopathy: Secondary | ICD-10-CM | POA: Diagnosis not present

## 2018-12-24 ENCOUNTER — Other Ambulatory Visit: Payer: Self-pay

## 2018-12-24 ENCOUNTER — Ambulatory Visit: Payer: Medicaid Other | Admitting: Cardiovascular Disease

## 2018-12-24 ENCOUNTER — Encounter: Payer: Self-pay | Admitting: Cardiovascular Disease

## 2018-12-24 VITALS — BP 128/78 | HR 67 | Temp 96.4°F | Ht 76.5 in | Wt 261.0 lb

## 2018-12-24 DIAGNOSIS — I10 Essential (primary) hypertension: Secondary | ICD-10-CM | POA: Diagnosis not present

## 2018-12-24 DIAGNOSIS — E785 Hyperlipidemia, unspecified: Secondary | ICD-10-CM

## 2018-12-24 DIAGNOSIS — I519 Heart disease, unspecified: Secondary | ICD-10-CM

## 2018-12-24 DIAGNOSIS — E1169 Type 2 diabetes mellitus with other specified complication: Secondary | ICD-10-CM

## 2018-12-24 DIAGNOSIS — I255 Ischemic cardiomyopathy: Secondary | ICD-10-CM

## 2018-12-24 DIAGNOSIS — E1159 Type 2 diabetes mellitus with other circulatory complications: Secondary | ICD-10-CM | POA: Diagnosis not present

## 2018-12-24 DIAGNOSIS — I214 Non-ST elevation (NSTEMI) myocardial infarction: Secondary | ICD-10-CM

## 2018-12-24 NOTE — Patient Instructions (Signed)
Medication Instructions:  Your physician recommends that you continue on your current medications as directed. Please refer to the Current Medication list given to you today.  If you need a refill on your cardiac medications before your next appointment, please call your pharmacy.   Lab work: Your physician recommends that you return for lab work in 2 MONTHS: FASTING LIPID AND LIVER FUNCTION TEST  If you have labs (blood work) drawn today and your tests are completely normal, you will receive your results only by: Marland Kitchen MyChart Message (if you have MyChart) OR . A paper copy in the mail If you have any lab test that is abnormal or we need to change your treatment, we will call you to review the results.  Testing/Procedures: Your physician has requested that you have an echocardiogram. Echocardiography is a painless test that uses sound waves to create images of your heart. It provides your doctor with information about the size and shape of your heart and how well your heart's chambers and valves are working. This procedure takes approximately one hour. There are no restrictions for this procedure. LOCATION:[facility]: [address], [place], [place] [address]  TO Gwendalyn Ege FOR October 2020    Follow-Up: At [facility], you and your health needs are our priority.  As part of our continuing mission to provide you with exceptional heart care, we have created designated Provider Care Teams.  These Care Teams include your primary Cardiologist (physician) and Advanced Practice Providers (APPs -  Physician Assistants and Nurse Practitioners) who all work together to provide you with the care you need, when you need it. You will need a follow up appointment in 3 months WITH DR. Gwenlyn Found.  Please call our office 2 months in advance to schedule this appointment.

## 2018-12-24 NOTE — Assessment & Plan Note (Signed)
History of hyperlipidemia on high-dose atorvastatin and fenofibrate with lipid profile performed 11/25/2018 revealing a total cholesterol 158, LDL of 95 and HDL 32.  We will recheck a lipid liver profile

## 2018-12-24 NOTE — Assessment & Plan Note (Signed)
History of ischemic cardiomyopathy with an EF in the 20 to 25% range on appropriate pharmacology discharged on LifeVest.  We will recheck a 2D echo and 2 months.  If his LV function improves to greater than 35% we will discontinue the LifeVest.  He has no symptoms of heart failure

## 2018-12-24 NOTE — Progress Notes (Signed)
12/24/2018 Evan [name]   [birth year]  [id number]  Primary Physician Evan [name], Evan Kaufmann, MD Primary Cardiologist: Evan Harp MD FACP, [place], [place], [place]  HPI:  Evan [name] is a 53 y.o. moderate to severely overweight divorced Caucasian male father of 2, grandfather of one grandchild who is partially disabled.  His primary care provider is Dr. Warrick [name].  His risk factors include treated hypertension hyperlipidemia as well as family history.  He had a delayed presentation of anterolateral myocardial infarction 11/24/2018 and had intervention by Evan [name] [name] of his proximal LAD.  He had staged RCA intervention several days later.  His EF was in the 20 to 25% range by 2D echo and he was discharged home on appropriate pharmacology and a LifeVest.  He is making lifestyle changes, has lost weight and is eating a more healthy diet.   Current Meds  Medication Sig  . ACCU-CHEK AVIVA PLUS test strip USE TO CHECK SUGAR TWICE DAILY  . aspirin EC 81 MG tablet Take 81 mg by mouth daily.  Marland Kitchen atorvastatin (LIPITOR) 80 MG tablet Take 1 tablet (80 mg total) by mouth daily at 6 PM.  . BD PEN NEEDLE NANO U/F 32G X 4 MM MISC USE DAILY WITH INSULIN  . carvedilol (COREG) 12.5 MG tablet Take 1 tablet (12.5 mg total) by mouth 2 (two) times daily with a meal.  . famotidine (PEPCID) 20 MG tablet Take 1 tablet (20 mg total) by mouth 2 (two) times daily.  . fenofibrate (TRICOR) 145 MG tablet TAKE 1 TABLET BY MOUTH EVERY DAY  . fluticasone (FLONASE) 50 MCG/ACT nasal spray Place 1 spray into both nostrils 2 (two) times a day.  . furosemide (LASIX) 20 MG tablet Take 1 tablet (20 mg total) by mouth daily.  Marland Kitchen HYDROcodone-acetaminophen (NORCO) 7.5-325 MG tablet Take 0.5 tablets by mouth daily as needed for moderate pain or severe pain.  . Insulin Detemir (LEVEMIR FLEXTOUCH) 100 UNIT/ML Pen INJECT 30 UNITS DAILY AT BEDTIME - UP TO 50 UNITS AS NEEDED  . JARDIANCE 25 MG TABS tablet Take 25 mg by mouth daily.  Marland Kitchen losartan  (COZAAR) 25 MG tablet Take 1 tablet (25 mg total) by mouth at bedtime.  . nitroGLYCERIN (NITROSTAT) 0.4 MG SL tablet Place 1 tablet (0.4 mg total) under the tongue every 5 (five) minutes x 3 doses as needed for chest pain.  . Omega-3 Fatty Acids (FISH OIL) 1000 MG CAPS Take 2,000 mg by mouth daily.  . potassium chloride (K-DUR) 10 MEQ tablet Take 1 tablet (10 mEq total) by mouth daily.  Marland Kitchen spironolactone (ALDACTONE) 25 MG tablet Take 0.5 tablets (12.5 mg total) by mouth daily.  . ticagrelor (BRILINTA) 90 MG TABS tablet Take 1 tablet (90 mg total) by mouth 2 (two) times daily.     No Known Allergies  Social History   Socioeconomic History  . Marital status: Single    Spouse name: Not on file  . Number of children: Not on file  . Years of education: Not on file  . Highest education level: Not on file  Occupational History  . Not on file  Social Needs  . Financial resource strain: Not on file  . Food insecurity    Worry: Not on file    Inability: Not on file  . Transportation needs    Medical: Not on file    Non-medical: Not on file  Tobacco Use  . Smoking status: Never Smoker  . Smokeless tobacco: Never Used  Substance and Sexual Activity  . Alcohol use: Yes    Alcohol/week: 12.0 standard drinks    Types: 12 Cans of beer per week  . Drug use: No  . Sexual activity: Yes    Comment: male 8 months partner  Lifestyle  . Physical activity    Days per week: Not on file    Minutes per session: Not on file  . Stress: Not on file  Relationships  . Social Herbalist on phone: Not on file    Gets together: Not on file    Attends religious service: Not on file    Active member of club or organization: Not on file    Attends meetings of clubs or organizations: Not on file    Relationship status: Not on file  . Intimate partner violence    Fear of current or ex partner: Not on file    Emotionally abused: Not on file    Physically abused: Not on file    Forced sexual  activity: Not on file  Other Topics Concern  . Not on file  Social History Narrative  . Not on file     Review of Systems: General: negative for chills, fever, night sweats or weight changes.  Cardiovascular: negative for chest pain, dyspnea on exertion, edema, orthopnea, palpitations, paroxysmal nocturnal dyspnea or shortness of breath Dermatological: negative for rash Respiratory: negative for cough or wheezing Urologic: negative for hematuria Abdominal: negative for nausea, vomiting, diarrhea, bright red blood per rectum, melena, or hematemesis Neurologic: negative for visual changes, syncope, or dizziness All other systems reviewed and are otherwise negative except as noted above.    Blood pressure 128/78, pulse 67, temperature (!) 96.4 F (35.8 C), height 6' 4.5" (1.943 m), weight 261 lb (118.4 kg).  General appearance: alert and no distress Neck: no adenopathy, no carotid bruit, no JVD, supple, symmetrical, trachea midline and thyroid not enlarged, symmetric, no tenderness/mass/nodules Lungs: clear to auscultation bilaterally Heart: regular rate and rhythm, S1, S2 normal, no murmur, click, rub or gallop Extremities: extremities normal, atraumatic, no cyanosis or edema Pulses: 2+ and symmetric Skin: Skin color, texture, turgor normal. No rashes or lesions Neurologic: Alert and oriented X 3, normal strength and tone. Normal symmetric reflexes. Normal coordination and gait  EKG not performed today  ASSESSMENT AND PLAN:   Hypertension associated with diabetes ([place]) History of essential hypertension with blood pressure measured today at 128/78.  He is on carvedilol, losartan and spironolactone.  Hyperlipidemia associated with type 2 diabetes mellitus ([place]) History of hyperlipidemia on high-dose atorvastatin and fenofibrate with lipid profile performed 11/25/2018 revealing a total cholesterol 158, LDL of 95 and HDL 32.  We will recheck a lipid liver profile  NSTEMI (non-ST  elevated myocardial infarction) ([place]) History of CAD status post late presentation of anterolateral MI status post proximal LAD intervention by Evan [name] [name] on 11/24/2018 with staged RCA intervention several days later.  He is on aspirin and Brilinta.  He denies chest pain or shortness of breath.  Ischemic cardiomyopathy History of ischemic cardiomyopathy with an EF in the 20 to 25% range on appropriate pharmacology discharged on LifeVest.  We will recheck a 2D echo and 2 months.  If his LV function improves to greater than 35% we will discontinue the LifeVest.  He has no symptoms of heart failure      Evan Harp MD [facility], [facility] 12/24/2018 2:24 PM

## 2018-12-24 NOTE — Assessment & Plan Note (Signed)
History of essential hypertension with blood pressure measured today at 128/78.  He is on carvedilol, losartan and spironolactone.

## 2018-12-24 NOTE — Assessment & Plan Note (Addendum)
History of CAD status post late presentation of anterolateral MI status post proximal LAD intervention by Dr. Fletcher Anon on 11/24/2018 with staged RCA intervention several days later.  He is on aspirin and Brilinta.  He denies chest pain or shortness of breath.

## 2018-12-25 ENCOUNTER — Other Ambulatory Visit: Payer: Self-pay | Admitting: Family Medicine

## 2019-01-01 ENCOUNTER — Other Ambulatory Visit: Payer: Self-pay | Admitting: Family Medicine

## 2019-01-02 DIAGNOSIS — M546 Pain in thoracic spine: Secondary | ICD-10-CM | POA: Diagnosis not present

## 2019-01-02 DIAGNOSIS — Z79899 Other long term (current) drug therapy: Secondary | ICD-10-CM | POA: Diagnosis not present

## 2019-01-02 DIAGNOSIS — M549 Dorsalgia, unspecified: Secondary | ICD-10-CM | POA: Diagnosis not present

## 2019-01-02 DIAGNOSIS — I252 Old myocardial infarction: Secondary | ICD-10-CM | POA: Diagnosis not present

## 2019-01-02 DIAGNOSIS — I700 Atherosclerosis of aorta: Secondary | ICD-10-CM | POA: Diagnosis not present

## 2019-01-02 DIAGNOSIS — I10 Essential (primary) hypertension: Secondary | ICD-10-CM | POA: Diagnosis not present

## 2019-01-02 DIAGNOSIS — Z794 Long term (current) use of insulin: Secondary | ICD-10-CM | POA: Diagnosis not present

## 2019-01-02 DIAGNOSIS — E119 Type 2 diabetes mellitus without complications: Secondary | ICD-10-CM | POA: Diagnosis not present

## 2019-01-02 DIAGNOSIS — K219 Gastro-esophageal reflux disease without esophagitis: Secondary | ICD-10-CM | POA: Diagnosis not present

## 2019-01-04 ENCOUNTER — Encounter: Payer: Self-pay | Admitting: Nutrition

## 2019-01-04 ENCOUNTER — Other Ambulatory Visit: Payer: Self-pay

## 2019-01-04 ENCOUNTER — Encounter: Payer: Medicaid Other | Attending: Family Medicine | Admitting: Nutrition

## 2019-01-04 VITALS — Ht 76.5 in | Wt 251.0 lb

## 2019-01-04 DIAGNOSIS — I152 Hypertension secondary to endocrine disorders: Secondary | ICD-10-CM

## 2019-01-04 DIAGNOSIS — E1159 Type 2 diabetes mellitus with other circulatory complications: Secondary | ICD-10-CM

## 2019-01-04 DIAGNOSIS — E11628 Type 2 diabetes mellitus with other skin complications: Secondary | ICD-10-CM

## 2019-01-04 DIAGNOSIS — L84 Corns and callosities: Secondary | ICD-10-CM | POA: Insufficient documentation

## 2019-01-04 DIAGNOSIS — I10 Essential (primary) hypertension: Secondary | ICD-10-CM | POA: Insufficient documentation

## 2019-01-04 DIAGNOSIS — I2510 Atherosclerotic heart disease of native coronary artery without angina pectoris: Secondary | ICD-10-CM

## 2019-01-04 DIAGNOSIS — Z9861 Coronary angioplasty status: Secondary | ICD-10-CM | POA: Insufficient documentation

## 2019-01-04 DIAGNOSIS — E66813 Obesity, class 3: Secondary | ICD-10-CM

## 2019-01-04 NOTE — Patient Instructions (Signed)
Goals Follow My Plate Eat 04-79 grams of carbs per meal Don't skip meals Eat meals on time as discussed. Drink more water  Cut down on diet sodas Increase walking as tolerated

## 2019-01-04 NOTE — Progress Notes (Signed)
Medical Nutrition Therapy:  Appt start time: 0800 end time:  0915.   Assessment:  Primary concerns today: Diabetes Type 2 a few years ago.. Had a heart attack 2 months ago. Lives with his mom. She does the cooking. Has recently changed to eating more foods baked and grilled and not fried. Eats 2 meals per day. Walking daily 5 minutes three times per day. Recently cut out alcohol. Has cut out chips and snacks and eating more fruit. 30 units of Levermir and Jardiance.  Testing three times a day. FBS:92-130' and 147 durin the day and night time 118-120's. A1C was 12% a few years ago and now down to 6.9% after heart attack with Levermir and Jardiance.. His diet is insuffient to meet his needs at this time. He is willing to work in improving eating habits and exercise. Original weight was 330 lbs, and when he had heart attack, his weight was 280's and now he is down to 250's.   Lab Results  Component Value Date   HGBA1C 6.9 (H) 11/25/2018   CMP Latest Ref Rng & Units 12/02/2018 11/28/2018 11/27/2018  Glucose 65 - 99 mg/dL 162(H) 118(H) 137(H)  BUN 6 - 24 mg/dL 24 17 19   Creatinine 0.76 - 1.27 mg/dL 1.08 0.94 0.99  Sodium 134 - 144 mmol/L 141 138 140  Potassium 3.5 - 5.2 mmol/L 4.1 3.3(L) 3.9  Chloride 96 - 106 mmol/L 100 105 106  CO2 20 - 29 mmol/L 23 23 22   Calcium 8.7 - 10.2 mg/dL 9.1 8.9 8.9  Total Protein 6.0 - 8.5 g/dL 6.9 - -  Total Bilirubin 0.0 - 1.2 mg/dL 0.5 - -  Alkaline Phos 39 - 117 IU/L 79 - -  AST 0 - 40 IU/L 13 - -  ALT 0 - 44 IU/L 22 - -   Lipid Panel     Component Value Date/Time   CHOL 158 11/25/2018 0431   CHOL 169 04/07/2018 1034   TRIG 156 (H) 11/25/2018 0431   HDL 32 (L) 11/25/2018 0431   HDL 31 (L) 04/07/2018 1034   CHOLHDL 4.9 11/25/2018 0431   VLDL 31 11/25/2018 0431   LDLCALC 95 11/25/2018 0431   LDLCALC 101 (H) 04/07/2018 1034    Preferred Learning Style:  Auditory  Visual  Ready for change:  Ready  Change in progress   MEDICATIONS:    DIETARY INTAKE:  24-hr recall:  B ( AM): 1 egg and 2 slices of wheat bread, CF diet coke Snk ( AM):  L ( PM): Skips usually but has some fruit Snk ( PM):  D ( PM): Broiled chicken, 1 peach, Diet Pepsi CF Snk ( PM): Beverages: Diet sodas  Usual physical activity: Walks 5 minutes a day.  Estimated energy needs: 2000  calories 225 g carbohydrates 150 g protein 56 g fat  Progress Towards Goal(s):  In progress.   Nutritional Diagnosis:  NB-1.1 Food and nutrition-related knowledge deficit As related to Diabetes.  As evidenced by A1C 12%.    Intervention:  Goals Follow My Plate Eat 66-06 grams of carbs per meal Don't skip meals Eat meals on time as discussed. Drink more water  Cut down on diet sodas Increase walking as tolerated Teaching Method Utilized:  Visual Auditory    Handouts given during visit include:  The Plate Method  High Fiber Heart Healthy Diet *  Barriers to learning/adherence to lifestyle change: None  Demonstrated degree of understanding via:  Teach Back   Monitoring/Evaluation:  Dietary intake, exercise, ,  and body weight in 1 month(s).

## 2019-01-12 ENCOUNTER — Ambulatory Visit (INDEPENDENT_AMBULATORY_CARE_PROVIDER_SITE_OTHER): Payer: Medicaid Other | Admitting: Family Medicine

## 2019-01-12 ENCOUNTER — Telehealth: Payer: Self-pay | Admitting: Family Medicine

## 2019-01-12 ENCOUNTER — Encounter: Payer: Self-pay | Admitting: Family Medicine

## 2019-01-12 DIAGNOSIS — L84 Corns and callosities: Secondary | ICD-10-CM

## 2019-01-12 DIAGNOSIS — F1120 Opioid dependence, uncomplicated: Secondary | ICD-10-CM

## 2019-01-12 DIAGNOSIS — M1A0720 Idiopathic chronic gout, left ankle and foot, without tophus (tophi): Secondary | ICD-10-CM

## 2019-01-12 DIAGNOSIS — E11628 Type 2 diabetes mellitus with other skin complications: Secondary | ICD-10-CM | POA: Diagnosis not present

## 2019-01-12 DIAGNOSIS — E1165 Type 2 diabetes mellitus with hyperglycemia: Secondary | ICD-10-CM

## 2019-01-12 DIAGNOSIS — I10 Essential (primary) hypertension: Secondary | ICD-10-CM

## 2019-01-12 DIAGNOSIS — E118 Type 2 diabetes mellitus with unspecified complications: Secondary | ICD-10-CM

## 2019-01-12 DIAGNOSIS — IMO0002 Reserved for concepts with insufficient information to code with codable children: Secondary | ICD-10-CM

## 2019-01-12 DIAGNOSIS — Z794 Long term (current) use of insulin: Secondary | ICD-10-CM

## 2019-01-12 DIAGNOSIS — E1169 Type 2 diabetes mellitus with other specified complication: Secondary | ICD-10-CM | POA: Diagnosis not present

## 2019-01-12 DIAGNOSIS — IMO0001 Reserved for inherently not codable concepts without codable children: Secondary | ICD-10-CM

## 2019-01-12 DIAGNOSIS — E11610 Type 2 diabetes mellitus with diabetic neuropathic arthropathy: Secondary | ICD-10-CM

## 2019-01-12 DIAGNOSIS — E1159 Type 2 diabetes mellitus with other circulatory complications: Secondary | ICD-10-CM

## 2019-01-12 DIAGNOSIS — Z0289 Encounter for other administrative examinations: Secondary | ICD-10-CM

## 2019-01-12 DIAGNOSIS — E785 Hyperlipidemia, unspecified: Secondary | ICD-10-CM

## 2019-01-12 MED ORDER — BD PEN NEEDLE NANO U/F 32G X 4 MM MISC: 3 refills | Status: DC

## 2019-01-12 MED ORDER — HYDROCODONE-ACETAMINOPHEN 7.5-325 MG PO TABS: 0.5000 | ORAL_TABLET | Freq: Every day | ORAL | 0 refills | Status: DC | PRN

## 2019-01-12 MED ORDER — HYDROCODONE-ACETAMINOPHEN 7.5-325 MG PO TABS: 0.5000 | ORAL_TABLET | Freq: Two times a day (BID) | ORAL | 0 refills | Status: DC | PRN

## 2019-01-12 MED ORDER — FUROSEMIDE 20 MG PO TABS: 20.0000 mg | ORAL_TABLET | Freq: Every day | ORAL | 3 refills | Status: DC

## 2019-01-12 MED ORDER — FLUTICASONE PROPIONATE 50 MCG/ACT NA SUSP: 1.0000 | Freq: Every day | NASAL | 3 refills | Status: DC

## 2019-01-12 NOTE — Telephone Encounter (Signed)
Spoke with the pharmacy and fixed his hydrocodone prescription.

## 2019-01-12 NOTE — Progress Notes (Signed)
Virtual Visit via telephone Note  I connected with Evan [name] on 01/12/19 at 1034 by telephone and verified that I am speaking with the correct person using two identifiers. Evan [name] is currently located at home and no other people are currently with her during visit. The provider, Fransisca Kaufmann Dettinger, MD is located in their office at time of visit.  Call ended at 1050  I discussed the limitations, risks, security and privacy concerns of performing an evaluation and management service by telephone and the availability of in person appointments. I also discussed with the patient that there may be a patient responsible charge related to this service. The patient expressed understanding and agreed to proceed.   History and Present Illness: Type 2 diabetes mellitus Patient comes in today for recheck of his diabetes. Patient has been currently taking jardiance and levemir 30, bs 90-100 and 140-150. Patient is currently on an ACE inhibitor/ARB. Patient has not seen an ophthalmologist this year. Patient denies any issues with their feet.   Hypertension Patient is currently on losartan and spironolactone and carvedilol, and their blood pressure today is 127/80. Patient denies any lightheadedness or dizziness. Patient denies headaches, blurred vision, chest pains, shortness of breath, or weakness. Denies any side effects from medication and is content with current medication.   GERD Patient is currently on no medication currently.  She denies any major symptoms or abdominal pain or belching or burping. She denies any blood in her stool or lightheadedness or dizziness.  Hyperlipidemia Patient is coming in for recheck of his hyperlipidemia. The patient is currently taking atorvastatin and fenofibrate. They deny any issues with myalgias or history of liver damage from it. They deny any focal numbness or weakness or chest pain.   Pain assessment: Cause of pain-degenerative disc disease Pain  location-back lumbar Pain on scale of 1-10- 1 today but he does not have it every day Frequency-few times a month where he builds up to where he has to take Norco, he does not take it every day What increases pain-certain movements will tweak it or if he lifts things What makes pain Better-stretching movements and then every now and then he has to take hydrocodone Effects on ADL -does not limit him currently Any change in general medical condition-none, he did have a heart attack earlier this year but his health has drastically improved and he is making great changes on his own  Current opioids rx-hydrocodone 7.5-325 daily as needed # meds rx-45 lasts 3 months Effectiveness of current meds-works well Adverse reactions form pain meds-none Morphine equivalent-3.75  Pill count performed-No Last drug screen -10/08/2018 ( high risk q49m moderate risk q655mlow risk yearly ) Urine drug screen today- No Was the [place]yes  If yes were their any concerning findings? -He got 1 prescription after his heart attack after leaving the hospital but it was cleared by [facility] other concerns  No flowsheet data found.   Pain contract signed on: 10/08/2018  No diagnosis found.  Outpatient Encounter Medications as of 01/12/2019  Medication Sig  . ACCU-CHEK AVIVA PLUS test strip USE TO CHECK SUGAR TWICE DAILY  . aspirin EC 81 MG tablet Take 81 mg by mouth daily.  . Marland Kitchentorvastatin (LIPITOR) 80 MG tablet Take 1 tablet (80 mg total) by mouth daily at 6 PM.  . BD PEN NEEDLE NANO U/F 32G X 4 MM MISC USE DAILY WITH INSULIN  . carvedilol (COREG) 12.5 MG tablet Take 1 tablet (12.5 mg total)  by mouth 2 (two) times daily with a meal.  . famotidine (PEPCID) 20 MG tablet Take 1 tablet (20 mg total) by mouth 2 (two) times daily.  . fenofibrate (TRICOR) 145 MG tablet TAKE 1 TABLET BY MOUTH EVERY DAY  . fluticasone (FLONASE) 50 MCG/ACT nasal spray Place 1 spray into both nostrils 2 (two) times a day.  . furosemide  (LASIX) 20 MG tablet Take 1 tablet (20 mg total) by mouth daily.  Marland Kitchen HYDROcodone-acetaminophen (NORCO) 7.5-325 MG tablet Take 0.5 tablets by mouth daily as needed for moderate pain or severe pain.  . Insulin Detemir (LEVEMIR FLEXTOUCH) 100 UNIT/ML Pen INJECT 30 UNITS DAILY AT BEDTIME - UP TO 50 UNITS AS NEEDED  . JARDIANCE 25 MG TABS tablet Take 25 mg by mouth daily.  Marland Kitchen losartan (COZAAR) 25 MG tablet Take 1 tablet (25 mg total) by mouth at bedtime.  . nitroGLYCERIN (NITROSTAT) 0.4 MG SL tablet Place 1 tablet (0.4 mg total) under the tongue every 5 (five) minutes x 3 doses as needed for chest pain.  . Omega-3 Fatty Acids (FISH OIL) 1000 MG CAPS Take 2,000 mg by mouth daily.  . potassium chloride (K-DUR) 10 MEQ tablet TAKE 1 TABLET BY MOUTH EVERY DAY  . spironolactone (ALDACTONE) 25 MG tablet Take 0.5 tablets (12.5 mg total) by mouth daily.  . ticagrelor (BRILINTA) 90 MG TABS tablet Take 1 tablet (90 mg total) by mouth 2 (two) times daily.   No facility-administered encounter medications on file as of 01/12/2019.     Review of Systems  Constitutional: Negative for chills and fever.  Respiratory: Negative for shortness of breath and wheezing.   Cardiovascular: Negative for chest pain and leg swelling.  Musculoskeletal: Positive for back pain. Negative for gait problem.  Skin: Negative for rash.  Neurological: Negative for dizziness, weakness, light-headedness and numbness.  All other systems reviewed and are negative.   Observations/Objective: Patient sounds comfortable and in no acute distress  Assessment and Plan: Problem List Items Addressed This Visit      Cardiovascular and Mediastinum   Hypertension associated with diabetes ([place])   Relevant Medications   furosemide (LASIX) 20 MG tablet   Other Relevant Orders   CMP14+EGFR     Endocrine   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) - Primary   Relevant Medications   BD PEN NEEDLE NANO U/F 32G X 4 MM MISC   Other  Relevant Orders   Bayer DCA Hb A1c Waived   CBC with Differential/Platelet   Hyperlipidemia associated with type 2 diabetes mellitus (HCC)   Relevant Orders   Lipid panel   Insulin dependent diabetes mellitus with complications (HCC)     Other   Gout   Relevant Medications   HYDROcodone-acetaminophen (NORCO) 7.5-325 MG tablet   Pain medication agreement signed   Relevant Medications   HYDROcodone-acetaminophen (NORCO) 7.5-325 MG tablet   Opiate dependence ([place])   Relevant Medications   HYDROcodone-acetaminophen (NORCO) 7.5-325 MG tablet    Other Visit Diagnoses    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 The patient was provided an opportunity to ask questions and all  were answered. The patient agreed with the plan and demonstrated an understanding of the instructions.   The patient was advised to call back or seek an in-person evaluation if the symptoms worsen or if the condition fails to improve as anticipated.  The above assessment and management plan was discussed with the patient. The patient verbalized understanding of and has agreed to the management plan. Patient is aware to call the clinic if symptoms persist or worsen. Patient is aware when to return to the clinic for a follow-up visit. Patient educated on when it is appropriate to go to the emergency department.    I provided 16 minutes of non-face-to-face time during this encounter.    Worthy Rancher, MD

## 2019-01-18 ENCOUNTER — Telehealth: Payer: Self-pay | Admitting: Family Medicine

## 2019-01-18 DIAGNOSIS — I420 Dilated cardiomyopathy: Secondary | ICD-10-CM | POA: Diagnosis not present

## 2019-01-18 DIAGNOSIS — I2109 ST elevation (STEMI) myocardial infarction involving other coronary artery of anterior wall: Secondary | ICD-10-CM | POA: Diagnosis not present

## 2019-01-18 NOTE — Telephone Encounter (Signed)
Looks like It was not ordered. Please advise

## 2019-01-19 ENCOUNTER — Telehealth: Payer: Self-pay | Admitting: Family Medicine

## 2019-01-19 DIAGNOSIS — Z9189 Other specified personal risk factors, not elsewhere classified: Secondary | ICD-10-CM

## 2019-01-19 NOTE — Telephone Encounter (Signed)
Please let the patient know that I may have missed putting in the referral, I put in the referral today Caryl Pina, MD [employer] 01/19/2019, 12:56 PM

## 2019-01-19 NOTE — Telephone Encounter (Signed)
Patient notified and verbalized understanding. 

## 2019-01-20 ENCOUNTER — Other Ambulatory Visit: Payer: Self-pay

## 2019-01-20 ENCOUNTER — Encounter (HOSPITAL_COMMUNITY)
Admission: RE | Admit: 2019-01-20 | Discharge: 2019-01-20 | Disposition: A | Discharge status: Home / Self Care | Payer: Medicaid Other | Source: Ambulatory Visit | Attending: Cardiovascular Disease | Admitting: Cardiovascular Disease

## 2019-01-20 ENCOUNTER — Other Ambulatory Visit: Payer: Self-pay | Admitting: Family Medicine

## 2019-01-26 ENCOUNTER — Other Ambulatory Visit: Payer: Self-pay | Admitting: Family Medicine

## 2019-02-01 ENCOUNTER — Telehealth: Payer: Self-pay | Admitting: Family Medicine

## 2019-02-01 ENCOUNTER — Other Ambulatory Visit: Payer: Self-pay | Admitting: Family Medicine

## 2019-02-01 ENCOUNTER — Telehealth: Payer: Self-pay

## 2019-02-01 MED ORDER — CARVEDILOL 12.5 MG PO TABS: 12.5000 mg | ORAL_TABLET | Freq: Two times a day (BID) | ORAL | 1 refills | Status: DC

## 2019-02-01 MED ORDER — ATORVASTATIN CALCIUM 80 MG PO TABS: 80.0000 mg | ORAL_TABLET | Freq: Every day | ORAL | 1 refills | Status: DC

## 2019-02-01 NOTE — Telephone Encounter (Signed)
Refills sent. Patient aware.  

## 2019-02-01 NOTE — Telephone Encounter (Signed)
Incoming call from operator transferred to primary nurse. Evan [name] with [facility] calling for update on Life Vest form for patient. Informed her that this has been signed by Dr. Gwenlyn Found today. She requests that this be sent to her either via fax or e-mail.  Fax [phone] E-mail address: [email or web address]  Form faxed and emailed on 9/15 via fax machine located in providers' mailbox room

## 2019-02-01 NOTE — Telephone Encounter (Signed)
What is the name of the medication?  atorvastatin (LIPITOR) 80 MG tablet carvedilol (COREG) 12.5 MG tablet  Have you contacted your pharmacy to request a refill? Yes, pt has been out for a week.  Which pharmacy would you [facility] sent to? [facility]   Patient notified that their request is being sent to the clinical staff for review and that they should receive a call once it is complete. If they do not receive a call within 24 hours they can check with their pharmacy or our office.

## 2019-02-08 ENCOUNTER — Encounter: Payer: Medicaid Other | Attending: Family Medicine | Admitting: Nutrition

## 2019-02-08 ENCOUNTER — Encounter: Payer: Self-pay | Admitting: Nutrition

## 2019-02-08 ENCOUNTER — Other Ambulatory Visit: Payer: Self-pay

## 2019-02-08 VITALS — Ht 76.5 in | Wt 254.0 lb

## 2019-02-08 DIAGNOSIS — I2510 Atherosclerotic heart disease of native coronary artery without angina pectoris: Secondary | ICD-10-CM | POA: Diagnosis not present

## 2019-02-08 DIAGNOSIS — Z9861 Coronary angioplasty status: Secondary | ICD-10-CM | POA: Diagnosis not present

## 2019-02-08 DIAGNOSIS — I10 Essential (primary) hypertension: Secondary | ICD-10-CM | POA: Diagnosis not present

## 2019-02-08 DIAGNOSIS — E1159 Type 2 diabetes mellitus with other circulatory complications: Secondary | ICD-10-CM | POA: Insufficient documentation

## 2019-02-08 NOTE — Progress Notes (Signed)
  Medical Nutrition Therapy:  Appt start time: 0930 end time:  1000.   Assessment:  Primary concerns today: Diabetes Type 2 a few years ago.. Had a heart attack 2 months ago.  wt at 248 lbs this am at home.  Currently wearing a defibrillator. FBS 90-t 134 -140's.mg/dl Has been walking three time per day. Feeling great.  Feels great stince stents been put in his heart- July 2020. He is doing great. No low blood sugars. Following meals well. Walks some.  .   Lab Results  Component Value Date   HGBA1C 6.9 (H) 11/25/2018   CMP Latest Ref Rng & Units 12/02/2018 11/28/2018 11/27/2018  Glucose 65 - 99 mg/dL 162(H) 118(H) 137(H)  BUN 6 - 24 mg/dL 24 17 19   Creatinine 0.76 - 1.27 mg/dL 1.08 0.94 0.99  Sodium 134 - 144 mmol/L 141 138 140  Potassium 3.5 - 5.2 mmol/L 4.1 3.3(L) 3.9  Chloride 96 - 106 mmol/L 100 105 106  CO2 20 - 29 mmol/L 23 23 22   Calcium 8.7 - 10.2 mg/dL 9.1 8.9 8.9  Total Protein 6.0 - 8.5 g/dL 6.9 - -  Total Bilirubin 0.0 - 1.2 mg/dL 0.5 - -  Alkaline Phos 39 - 117 IU/L 79 - -  AST 0 - 40 IU/L 13 - -  ALT 0 - 44 IU/L 22 - -   Lipid Panel     Component Value Date/Time   CHOL 158 11/25/2018 0431   CHOL 169 04/07/2018 1034   TRIG 156 (H) 11/25/2018 0431   HDL 32 (L) 11/25/2018 0431   HDL 31 (L) 04/07/2018 1034   CHOLHDL 4.9 11/25/2018 0431   VLDL 31 11/25/2018 0431   LDLCALC 95 11/25/2018 0431   LDLCALC 101 (H) 04/07/2018 1034    Preferred Learning Style:  Auditory  Visual  Ready for change:  Ready  Change in progress   MEDICATIONS:    DIETARY INTAKE:  24-hr recall:  B ( AM): cherrios and almond milk, , fruit, .eggs, Snk ( AM):  L ( PM): Salad and fruit, water Snk ( PM):  D ( PM): chicken, baked potato, water Snk ( PM): Beverages: water,  Usual physical activity: Walks 5 minutes a day.  Estimated energy needs: 2000  calories 225 g carbohydrates 150 g protein 56 g fat  Progress Towards Goal(s):  In progress.   Nutritional Diagnosis:   NB-1.1 Food and nutrition-related knowledge deficit As related to Diabetes.  As evidenced by A1C 12%.    Intervention:  Goals  Follow My Plate Eat [id number] grams of carbs per meal Don't skip meals Eat meals on time as discussed. Drink more water  Cut out  on diet sodas Increase walking as tolerated  Lose 2 lbs per month.  Teaching Method Utilized:  Visual Auditory    Handouts given during visit include:  The Plate Method  High Fiber Heart Healthy Diet *  Barriers to learning/adherence to lifestyle change: None  Demonstrated degree of understanding via:  Teach Back   Monitoring/Evaluation:  Dietary intake, exercise, , and body weight in 1 month(s).

## 2019-02-08 NOTE — Patient Instructions (Signed)
Follow My Plate Eat [id number] grams of carbs per meal Don't skip meals Eat meals on time as discussed. Drink more water  Cut out  on diet sodas Increase walking as tolerated  Lose 2 lbs per month.

## 2019-02-21 ENCOUNTER — Encounter (HOSPITAL_COMMUNITY): Payer: Self-pay

## 2019-02-21 ENCOUNTER — Other Ambulatory Visit: Payer: Medicaid Other

## 2019-02-21 ENCOUNTER — Other Ambulatory Visit: Payer: Self-pay | Admitting: Family Medicine

## 2019-02-21 ENCOUNTER — Encounter (HOSPITAL_COMMUNITY): Payer: Medicaid Other

## 2019-02-21 ENCOUNTER — Other Ambulatory Visit: Payer: Self-pay

## 2019-02-21 DIAGNOSIS — E11628 Type 2 diabetes mellitus with other skin complications: Secondary | ICD-10-CM | POA: Diagnosis not present

## 2019-02-21 DIAGNOSIS — I10 Essential (primary) hypertension: Secondary | ICD-10-CM | POA: Diagnosis not present

## 2019-02-21 DIAGNOSIS — K2100 Gastro-esophageal reflux disease with esophagitis, without bleeding: Secondary | ICD-10-CM | POA: Diagnosis not present

## 2019-02-21 DIAGNOSIS — E1159 Type 2 diabetes mellitus with other circulatory complications: Secondary | ICD-10-CM

## 2019-02-21 DIAGNOSIS — L84 Corns and callosities: Secondary | ICD-10-CM | POA: Diagnosis not present

## 2019-02-21 DIAGNOSIS — I152 Hypertension secondary to endocrine disorders: Secondary | ICD-10-CM

## 2019-02-21 LAB — BAYER DCA HB A1C WAIVED: HB A1C (BAYER DCA - WAIVED): 6.2 % (ref <7.0)

## 2019-02-21 NOTE — Progress Notes (Signed)
Placed orders for patient to do blood work

## 2019-02-22 ENCOUNTER — Telehealth: Payer: Self-pay | Admitting: Cardiovascular Disease

## 2019-02-22 NOTE — Telephone Encounter (Signed)
Please advise?  I see back in September that it was signed and either mailed or faxed back- this may be another option. Will route to nurse to see if she knows about this. Thank you!

## 2019-02-22 NOTE — Telephone Encounter (Signed)
New Message   Nurse calling in to see if the order to the [facility] Life vest was received. Order was sent on October 1 at 8:28 am. Please call and confirm.

## 2019-02-23 ENCOUNTER — Other Ambulatory Visit: Payer: Self-pay

## 2019-02-23 ENCOUNTER — Ambulatory Visit (HOSPITAL_COMMUNITY): Payer: Medicaid Other | Attending: Cardiovascular Disease

## 2019-02-23 DIAGNOSIS — I519 Heart disease, unspecified: Secondary | ICD-10-CM

## 2019-02-23 LAB — CBC WITH DIFFERENTIAL/PLATELET
Basophils Absolute: 0.1 10*3/uL (ref 0.0–0.2)
Basos: 1 %
EOS (ABSOLUTE): 0.3 10*3/uL (ref 0.0–0.4)
Eos: 3 %
Hematocrit: 41.4 % (ref 37.5–51.0)
Hemoglobin: 13.7 g/dL (ref 13.0–17.7)
Immature Grans (Abs): 0 10*3/uL (ref 0.0–0.1)
Immature Granulocytes: 0 %
Lymphocytes Absolute: 2 10*3/uL (ref 0.7–3.1)
Lymphs: 23 %
MCH: 29.2 pg (ref 26.6–33.0)
MCHC: 33.1 g/dL (ref 31.5–35.7)
MCV: 88 fL (ref 79–97)
Monocytes Absolute: 0.7 10*3/uL (ref 0.1–0.9)
Monocytes: 8 %
Neutrophils Absolute: 5.7 10*3/uL (ref 1.4–7.0)
Neutrophils: 65 %
Platelets: 248 10*3/uL (ref 150–450)
RBC: 4.69 x10E6/uL (ref 4.14–5.80)
RDW: 13.2 % (ref 11.6–15.4)
WBC: 8.8 10*3/uL (ref 3.4–10.8)

## 2019-02-23 LAB — CMP14+EGFR
ALT: 24 IU/L (ref 0–44)
AST: 24 IU/L (ref 0–40)
Albumin/Globulin Ratio: 1.7 (ref 1.2–2.2)
Albumin: 4.4 g/dL (ref 3.8–4.9)
Alkaline Phosphatase: 67 IU/L (ref 39–117)
BUN/Creatinine Ratio: 13 (ref 9–20)
BUN: 17 mg/dL (ref 6–24)
Bilirubin Total: 0.3 mg/dL (ref 0.0–1.2)
CO2: 19 mmol/L — ABNORMAL LOW (ref 20–29)
Calcium: 9.3 mg/dL (ref 8.7–10.2)
Chloride: 101 mmol/L (ref 96–106)
Creatinine, Ser: 1.26 mg/dL (ref 0.76–1.27)
GFR calc Af Amer: 75 mL/min/{1.73_m2} (ref >59)
GFR calc non Af Amer: 65 mL/min/{1.73_m2} (ref >59)
Globulin, Total: 2.6 g/dL (ref 1.5–4.5)
Glucose: 74 mg/dL (ref 65–99)
Potassium: 4.8 mmol/L (ref 3.5–5.2)
Sodium: 140 mmol/L (ref 134–144)
Total Protein: 7 g/dL (ref 6.0–8.5)

## 2019-02-23 LAB — LIPID PANEL
Chol/HDL Ratio: 4 ratio (ref 0.0–5.0)
Cholesterol, Total: 95 mg/dL — ABNORMAL LOW (ref 100–199)
HDL: 24 mg/dL — ABNORMAL LOW (ref >39)
LDL Chol Calc (NIH): 46 mg/dL (ref 0–99)
Triglycerides: 141 mg/dL (ref 0–149)
VLDL Cholesterol Cal: 25 mg/dL (ref 5–40)

## 2019-02-23 MED ORDER — PERFLUTREN LIPID MICROSPHERE
1.0000 mL | INTRAVENOUS | Status: AC | PRN
  Administered 2019-02-23: 1 mL via INTRAVENOUS

## 2019-02-23 NOTE — Telephone Encounter (Signed)
[facility] life vest order faxed over to [facility] from [facility] on 10/6 per fax documentation on form.  Form completed and signed by Dr. Gwenlyn Found. Form faxed back to courtney dickens on 02/23/2019 at fax: [phone]

## 2019-02-24 ENCOUNTER — Telehealth: Payer: Self-pay | Admitting: *Deleted

## 2019-02-24 NOTE — Telephone Encounter (Signed)
[email or web address]

## 2019-02-24 NOTE — Telephone Encounter (Signed)
Patient is calling requesting a refill on hydrocodone. I advised patient this is not usually done outside of an appointment. Patient states that you advised him to call when he needed a refill since he just had a visit with you in August.

## 2019-02-24 NOTE — Telephone Encounter (Signed)
Yes I did say that he would have to call but I also said that he would have to do it during an appointment, can be a virtual or phone appointment but he does need to be in an appointment.

## 2019-03-09 NOTE — Telephone Encounter (Signed)
No return call from patient. Note filed. 

## 2019-03-25 ENCOUNTER — Ambulatory Visit: Payer: Medicaid Other | Admitting: Cardiovascular Disease

## 2019-03-25 ENCOUNTER — Other Ambulatory Visit: Payer: Self-pay

## 2019-03-25 ENCOUNTER — Encounter: Payer: Self-pay | Admitting: Cardiovascular Disease

## 2019-03-25 VITALS — BP 110/68 | HR 64 | Temp 97.2°F | Ht 76.5 in | Wt 253.0 lb

## 2019-03-25 DIAGNOSIS — I255 Ischemic cardiomyopathy: Secondary | ICD-10-CM

## 2019-03-25 MED ORDER — FUROSEMIDE 20 MG PO TABS: 20.0000 mg | ORAL_TABLET | Freq: Every day | ORAL | 1 refills | Status: DC

## 2019-03-25 MED ORDER — SPIRONOLACTONE 25 MG PO TABS: 12.5000 mg | ORAL_TABLET | Freq: Every day | ORAL | 1 refills | Status: DC

## 2019-03-25 MED ORDER — LOSARTAN POTASSIUM 25 MG PO TABS: 25.0000 mg | ORAL_TABLET | Freq: Every day | ORAL | 1 refills | Status: DC

## 2019-03-25 MED ORDER — TICAGRELOR 90 MG PO TABS: ORAL_TABLET | ORAL | 1 refills | Status: DC

## 2019-03-25 MED ORDER — ATORVASTATIN CALCIUM 80 MG PO TABS: 80.0000 mg | ORAL_TABLET | Freq: Every day | ORAL | 1 refills | Status: DC

## 2019-03-25 NOTE — Assessment & Plan Note (Signed)
History of non-STEMI with delayed presentation of anterolateral infarct 11/24/2018 with intervention by Dr. Fletcher Anon of his proximal LAD.  He had staged RCA intervention several days later.  His EF at that time was 20 to 25%.  He remains on aspirin and Brilinta.  Has had no recurrent symptoms.  He does walk and mow yards without limitation.

## 2019-03-25 NOTE — Progress Notes (Signed)
03/25/2019 Evan [name]   06-23-1965  [id number]  Primary Physician Dettinger, Fransisca Kaufmann, MD Primary Cardiologist: Lorretta Harp MD FACP, [place], [place], [place]  HPI:  Evan [name] is a 53 y.o.  moderate to severely overweight divorced Caucasian male father of 2, grandfather of one grandchild who is partially disabled.  His primary care provider is Dr. Warrick Parisian.  I last saw him in the office 12/24/2018. His risk factors include treated hypertension hyperlipidemia as well as family history.  He had a delayed presentation of anterolateral myocardial infarction 11/24/2018 and had intervention by Dr. Fletcher Anon of his proximal LAD.  He had staged RCA intervention several days later.  His EF was in the 20 to 25% range by 2D echo and he was discharged home on appropriate pharmacology and a LifeVest.  He is making lifestyle changes, has lost weight and is eating a more healthy diet.  Since I saw him last he continues to improve.  He is mowing lawns and walking frequently without limitation.  His most recent 2D echocardiogram performed after pharmacologic therapy for LV dysfunction on 02/23/2019 revealed an improvement in his EF from 20 to 25% up to 35 to 40%.  Based on this I suggested that we discontinue the LifeVest.  He says that his lifestyle change.  Is eating habits have as well.  His most recent lipid profile is excellent with total cholesterol 95, LDL of 46 and HDL 24.    Current Meds  Medication Sig  . ACCU-CHEK AVIVA PLUS test strip USE TO CHECK SUGAR TWICE DAILY  . aspirin EC 81 MG tablet Take 81 mg by mouth daily.  Marland Kitchen atorvastatin (LIPITOR) 80 MG tablet Take 1 tablet (80 mg total) by mouth daily at 6 PM.  . BD PEN NEEDLE NANO U/F 32G X 4 MM MISC USE DAILY WITH INSULIN  . carvedilol (COREG) 12.5 MG tablet TAKE 1 TABLET (12.5 MG TOTAL) BY MOUTH 2 (TWO) TIMES DAILY WITH A MEAL.  . famotidine (PEPCID) 20 MG tablet Take 1 tablet (20 mg total) by mouth 2 (two) times daily.  . fenofibrate (TRICOR)  145 MG tablet TAKE 1 TABLET BY MOUTH EVERY DAY  . fluticasone (FLONASE) 50 MCG/ACT nasal spray Place 1 spray into both nostrils daily.  . furosemide (LASIX) 20 MG tablet Take 1 tablet (20 mg total) by mouth daily.  Marland Kitchen HYDROcodone-acetaminophen (NORCO) 7.5-325 MG tablet Take 0.5-1 tablets by mouth 2 (two) times daily as needed for severe pain.  . Insulin Detemir (LEVEMIR FLEXTOUCH) 100 UNIT/ML Pen INJECT 30 UNITS DAILY AT BEDTIME - UP TO 50 UNITS AS NEEDED  . JARDIANCE 25 MG TABS tablet Take 25 mg by mouth daily.  Marland Kitchen losartan (COZAAR) 25 MG tablet Take 1 tablet (25 mg total) by mouth at bedtime.  . nitroGLYCERIN (NITROSTAT) 0.4 MG SL tablet Place 1 tablet (0.4 mg total) under the tongue every 5 (five) minutes x 3 doses as needed for chest pain.  . Omega-3 Fatty Acids (FISH OIL) 1000 MG CAPS Take 2,000 mg by mouth daily.  . potassium chloride (K-DUR) 10 MEQ tablet TAKE 1 TABLET BY MOUTH EVERY DAY  . spironolactone (ALDACTONE) 25 MG tablet Take 0.5 tablets (12.5 mg total) by mouth daily.  . ticagrelor (BRILINTA) 90 MG TABS tablet TAKE 1 TABLET (90 MG TOTAL) BY MOUTH 2 (TWO) TIMES DAILY.  . [DISCONTINUED] atorvastatin (LIPITOR) 80 MG tablet TAKE 1 TABLET (80 MG TOTAL) BY MOUTH DAILY AT 6 PM.  . [DISCONTINUED] BRILINTA 90  MG TABS tablet TAKE 1 TABLET (90 MG TOTAL) BY MOUTH 2 (TWO) TIMES DAILY.  . [DISCONTINUED] furosemide (LASIX) 20 MG tablet Take 1 tablet (20 mg total) by mouth daily.  . [DISCONTINUED] losartan (COZAAR) 25 MG tablet Take 1 tablet (25 mg total) by mouth at bedtime.  . [DISCONTINUED] spironolactone (ALDACTONE) 25 MG tablet Take 0.5 tablets (12.5 mg total) by mouth daily.     No Known Allergies  Social History   Socioeconomic History  . Marital status: Single    Spouse name: Not on file  . Number of children: Not on file  . Years of education: Not on file  . Highest education level: Not on file  Occupational History  . Not on file  Social Needs  . Financial resource strain:  Not on file  . Food insecurity    Worry: Not on file    Inability: Not on file  . Transportation needs    Medical: Not on file    Non-medical: Not on file  Tobacco Use  . Smoking status: Never Smoker  . Smokeless tobacco: Never Used  Substance and Sexual Activity  . Alcohol use: Yes    Alcohol/week: 12.0 standard drinks    Types: 12 Cans of beer per week  . Drug use: No  . Sexual activity: Yes    Comment: male 8 months partner  Lifestyle  . Physical activity    Days per week: Not on file    Minutes per session: Not on file  . Stress: Not on file  Relationships  . Social Herbalist on phone: Not on file    Gets together: Not on file    Attends religious service: Not on file    Active member of club or organization: Not on file    Attends meetings of clubs or organizations: Not on file    Relationship status: Not on file  . Intimate partner violence    Fear of current or ex partner: Not on file    Emotionally abused: Not on file    Physically abused: Not on file    Forced sexual activity: Not on file  Other Topics Concern  . Not on file  Social History Narrative  . Not on file     Review of Systems: General: negative for chills, fever, night sweats or weight changes.  Cardiovascular: negative for chest pain, dyspnea on exertion, edema, orthopnea, palpitations, paroxysmal nocturnal dyspnea or shortness of breath Dermatological: negative for rash Respiratory: negative for cough or wheezing Urologic: negative for hematuria Abdominal: negative for nausea, vomiting, diarrhea, bright red blood per rectum, melena, or hematemesis Neurologic: negative for visual changes, syncope, or dizziness All other systems reviewed and are otherwise negative except as noted above.    Blood pressure 110/68, pulse 64, temperature (!) 97.2 F (36.2 C), height 6' 4.5" (1.943 m), weight 253 lb (114.8 kg).  General appearance: alert and no distress Neck: no adenopathy, no  carotid bruit, no JVD, supple, symmetrical, trachea midline and thyroid not enlarged, symmetric, no tenderness/mass/nodules Lungs: clear to auscultation bilaterally Heart: regular rate and rhythm, S1, S2 normal, no murmur, click, rub or gallop Extremities: extremities normal, atraumatic, no cyanosis or edema Pulses: 2+ and symmetric Skin: Skin color, texture, turgor normal. No rashes or lesions Neurologic: Alert and oriented X 3, normal strength and tone. Normal symmetric reflexes. Normal coordination and gait  EKG sinus rhythm at 64 with Q waves in V1 through V3 and low limb voltage.  I  personally reviewed this EKG.  ASSESSMENT AND PLAN:   Hypertension associated with diabetes ([place]) History of essential hypertension with blood pressure measured today 110/68.  He is on carvedilol, losartan and spironolactone.  Hyperlipidemia associated with type 2 diabetes mellitus ([place]) History of hyperlipidemia on high-dose statin therapy in addition to fenofibrate with fasting lipid profile performed 02/23/2019 revealing took less than 95, LDL 46 and HDL of 24.  His triglyceride level was 141.  NSTEMI (non-ST elevated myocardial infarction) ([place]) History of non-STEMI with delayed presentation of anterolateral infarct 11/24/2018 with intervention by Dr. Fletcher Anon of his proximal LAD.  He had staged RCA intervention several days later.  His EF at that time was 20 to 25%.  He remains on aspirin and Brilinta.  Has had no recurrent symptoms.  He does walk and mow yards without limitation.  Ischemic cardiomyopathy History ischemic heart cardiomyopathy with an initial EF of the time of presentation of 20 to 25%.  He was placed on a LifeVest which he wears daily.  He was put on optimal medical therapy with recent follow-up echo performed 02/23/2019 revealed improvement in his EF to 35 to 40% with moderate hypokinesis of the apex.  Based on this, I have recommended that we discontinue the LifeVest and continue current  medications.  While he is on low-dose carvedilol and losartan I do not think we have room with regards to blood pressure to titrate these.      Lorretta Harp MD FACP,FACC,FAHA, [facility] 03/25/2019 10:37 AM

## 2019-03-25 NOTE — Assessment & Plan Note (Signed)
History of hyperlipidemia on high-dose statin therapy in addition to fenofibrate with fasting lipid profile performed 02/23/2019 revealing took less than 95, LDL 46 and HDL of 24.  His triglyceride level was 141.

## 2019-03-25 NOTE — Assessment & Plan Note (Signed)
History of essential hypertension with blood pressure measured today 110/68.  He is on carvedilol, losartan and spironolactone.

## 2019-03-25 NOTE — Patient Instructions (Signed)
Medication Instructions:  No changes *If you need a refill on your cardiac medications before your next appointment, please call your pharmacy*  Lab Work: None ordered If you have labs (blood work) drawn today and your tests are completely normal, you will receive your results only by: Marland Kitchen MyChart Message (if you have MyChart) OR . A paper copy in the mail If you have any lab test that is abnormal or we need to change your treatment, we will call you to review the results.  Testing/Procedures: None ordered  Follow-Up: At [facility], you and your health needs are our priority.  As part of our continuing mission to provide you with exceptional heart care, we have created designated Provider Care Teams.  These Care Teams include your primary Cardiologist (physician) and Advanced Practice Providers (APPs -  Physician Assistants and Nurse Practitioners) who all work together to provide you with the care you need, when you need it.  Your next appointment:   6 months  The format for your next appointment:   In Person  Provider:   Quay Burow, MD  Other Instructions You may stop wearing the Life Vest and now can send it back

## 2019-03-25 NOTE — Assessment & Plan Note (Signed)
History ischemic heart cardiomyopathy with an initial EF of the time of presentation of 20 to 25%.  He was placed on a LifeVest which he wears daily.  He was put on optimal medical therapy with recent follow-up echo performed 02/23/2019 revealed improvement in his EF to 35 to 40% with moderate hypokinesis of the apex.  Based on this, I have recommended that we discontinue the LifeVest and continue current medications.  While he is on low-dose carvedilol and losartan I do not think we have room with regards to blood pressure to titrate these.

## 2019-03-29 ENCOUNTER — Other Ambulatory Visit: Payer: Self-pay

## 2019-03-30 ENCOUNTER — Ambulatory Visit (INDEPENDENT_AMBULATORY_CARE_PROVIDER_SITE_OTHER): Payer: Medicaid Other

## 2019-03-30 ENCOUNTER — Other Ambulatory Visit: Payer: Self-pay

## 2019-03-30 DIAGNOSIS — Z23 Encounter for immunization: Secondary | ICD-10-CM

## 2019-04-11 ENCOUNTER — Other Ambulatory Visit: Payer: Self-pay | Admitting: Family Medicine

## 2019-04-12 ENCOUNTER — Other Ambulatory Visit: Payer: Self-pay

## 2019-04-13 ENCOUNTER — Encounter: Payer: Self-pay | Admitting: Family Medicine

## 2019-04-13 ENCOUNTER — Ambulatory Visit: Payer: Medicaid Other | Admitting: Family Medicine

## 2019-04-13 VITALS — BP 126/83 | HR 68 | Temp 97.5°F | Ht 76.45 in | Wt 252.6 lb

## 2019-04-13 DIAGNOSIS — F1120 Opioid dependence, uncomplicated: Secondary | ICD-10-CM

## 2019-04-13 DIAGNOSIS — Z0289 Encounter for other administrative examinations: Secondary | ICD-10-CM

## 2019-04-13 DIAGNOSIS — M1A0720 Idiopathic chronic gout, left ankle and foot, without tophus (tophi): Secondary | ICD-10-CM | POA: Diagnosis not present

## 2019-04-13 DIAGNOSIS — Z1211 Encounter for screening for malignant neoplasm of colon: Secondary | ICD-10-CM | POA: Diagnosis not present

## 2019-04-13 DIAGNOSIS — E11628 Type 2 diabetes mellitus with other skin complications: Secondary | ICD-10-CM | POA: Diagnosis not present

## 2019-04-13 DIAGNOSIS — L84 Corns and callosities: Secondary | ICD-10-CM | POA: Diagnosis not present

## 2019-04-13 LAB — GLUCOSE HEMOCUE WAIVED: Glu Hemocue Waived: 104 mg/dL — ABNORMAL HIGH (ref 65–99)

## 2019-04-13 MED ORDER — FAMOTIDINE 20 MG PO TABS: 20.0000 mg | ORAL_TABLET | Freq: Two times a day (BID) | ORAL | 1 refills | Status: DC

## 2019-04-13 MED ORDER — FLUTICASONE PROPIONATE 50 MCG/ACT NA SUSP: 1.0000 | Freq: Every day | NASAL | 3 refills | Status: DC

## 2019-04-13 MED ORDER — LOSARTAN POTASSIUM 25 MG PO TABS: 25.0000 mg | ORAL_TABLET | Freq: Every day | ORAL | 3 refills | Status: DC

## 2019-04-13 MED ORDER — ATORVASTATIN CALCIUM 80 MG PO TABS: 80.0000 mg | ORAL_TABLET | Freq: Every day | ORAL | 3 refills | Status: DC

## 2019-04-13 MED ORDER — CARVEDILOL 12.5 MG PO TABS: 12.5000 mg | ORAL_TABLET | Freq: Two times a day (BID) | ORAL | 3 refills | Status: DC

## 2019-04-13 MED ORDER — FENOFIBRATE 145 MG PO TABS: 145.0000 mg | ORAL_TABLET | Freq: Every day | ORAL | 3 refills | Status: DC

## 2019-04-13 MED ORDER — FUROSEMIDE 20 MG PO TABS: 20.0000 mg | ORAL_TABLET | Freq: Every day | ORAL | 3 refills | Status: DC

## 2019-04-13 MED ORDER — HYDROCODONE-ACETAMINOPHEN 7.5-325 MG PO TABS: 0.5000 | ORAL_TABLET | Freq: Two times a day (BID) | ORAL | 0 refills | Status: DC | PRN

## 2019-04-13 MED ORDER — JARDIANCE 25 MG PO TABS: 25.0000 mg | ORAL_TABLET | Freq: Every day | ORAL | 6 refills | Status: DC

## 2019-04-13 NOTE — Progress Notes (Signed)
BP 126/83   Pulse 68   Temp (!) 97.5 F (36.4 C) (Temporal)   Ht 6' 4.45" (1.942 m)   Wt 252 lb 9.6 oz (114.6 kg)   SpO2 100%   BMI 30.39 kg/m    Subjective:   Patient ID: Evan [name], male    DOB: [birth year], 53 y.o.   MRN: [id number]  HPI: Evan [name] is a 53 y.o. male presenting on 04/13/2019 for Diabetes (check up)   HPI Chronic knee pain/arthritis Pain assessment: Cause of pain-arthritis in both knees Pain location-both knees bilateral Pain on scale of 1-10-2, does not come every day Frequency-couple times a week, mainly when he does something that might overdo it. What increases pain-over doing it per overworking What makes pain Better-stretching and exercise Effects on ADL -minimal limitations Any change in general medical condition-none  Current opioids rx-hydrocodone-acetaminophen 7.5-325, half a tablet to 1 tablet twice a day as needed # meds rx-45 Effectiveness of current meds-works well, does not have to use it every day Adverse reactions form pain meds-none Morphine equivalent-7.5  Pill count performed-No Last drug screen -10/08/2018 ( high risk q67m moderate risk q668mlow risk yearly ) Urine drug screen today- No [place]  If yes were their any concerning findings? -None  No flowsheet data found.   Pain contract signed on: 10/06/2018  Patient has diabetes and hypertension cholesterol but does see endocrinology for this.  We will refill his medications but they manage it mostly.  His blood work 1 month ago looks good, his A1c look better.  Relevant past medical, surgical, family and social history reviewed and updated as indicated. Interim medical history since our last visit reviewed. Allergies and medications reviewed and updated.  Review of Systems  Constitutional: Negative for chills and fever.  Respiratory: Negative for shortness of breath and wheezing.   Cardiovascular: Negative for chest pain and leg swelling.   Musculoskeletal: Positive for arthralgias. Negative for back pain and gait problem.  Skin: Negative for rash.  All other systems reviewed and are negative.   Per HPI unless specifically indicated above   Allergies as of 04/13/2019   No Known Allergies     Medication List       Accurate as of April 13, 2019  9:28 AM. If you have any questions, ask your nurse or doctor.        Accu-Chek Aviva Plus test strip Generic drug: glucose blood USE TO CHECK SUGAR TWICE DAILY   aspirin EC 81 MG tablet Take 81 mg by mouth daily.   atorvastatin 80 MG tablet Commonly known as: LIPITOR Take 1 tablet (80 mg total) by mouth daily at 6 PM.   BD Pen Needle Nano U/F 32G X 4 MM Misc Generic drug: Insulin Pen Needle USE DAILY WITH INSULIN   carvedilol 12.5 MG tablet Commonly known as: COREG Take 1 tablet (12.5 mg total) by mouth 2 (two) times daily with a meal.   famotidine 20 MG tablet Commonly known as: Pepcid Take 1 tablet (20 mg total) by mouth 2 (two) times daily.   fenofibrate 145 MG tablet Commonly known as: TRICOR Take 1 tablet (145 mg total) by mouth daily.   Fish Oil 1000 MG Caps Take 2,000 mg by mouth daily.   fluticasone 50 MCG/ACT nasal spray Commonly known as: FLONASE Place 1 spray into both nostrils daily.   furosemide 20 MG tablet Commonly known as: LASIX Take 1 tablet (20 mg total) by mouth daily.  HYDROcodone-acetaminophen 7.5-325 MG tablet Commonly known as: NORCO Take 0.5-1 tablets by mouth 2 (two) times daily as needed for severe pain.   Insulin Detemir 100 UNIT/ML Pen Commonly known as: Levemir FlexTouch INJECT 30 UNITS DAILY AT BEDTIME - UP TO 50 UNITS AS NEEDED   Jardiance 25 MG Tabs tablet Generic drug: empagliflozin Take 25 mg by mouth daily.   losartan 25 MG tablet Commonly known as: COZAAR Take 1 tablet (25 mg total) by mouth at bedtime.   nitroGLYCERIN 0.4 MG SL tablet Commonly known as: NITROSTAT Place 1 tablet (0.4 mg total)  under the tongue every 5 (five) minutes x 3 doses as needed for chest pain.   potassium chloride 10 MEQ tablet Commonly known as: KLOR-CON TAKE 1 TABLET BY MOUTH EVERY DAY   spironolactone 25 MG tablet Commonly known as: ALDACTONE Take 0.5 tablets (12.5 mg total) by mouth daily.   ticagrelor 90 MG Tabs tablet Commonly known as: Brilinta TAKE 1 TABLET (90 MG TOTAL) BY MOUTH 2 (TWO) TIMES DAILY.        Objective:   BP 126/83   Pulse 68   Temp (!) 97.5 F (36.4 C) (Temporal)   Ht 6' 4.45" (1.942 m)   Wt 252 lb 9.6 oz (114.6 kg)   SpO2 100%   BMI 30.39 kg/m   Wt Readings from Last 3 Encounters:  04/13/19 252 lb 9.6 oz (114.6 kg)  03/25/19 253 lb (114.8 kg)  02/08/19 254 lb (115.2 kg)    Physical Exam Vitals signs and nursing note reviewed.  Constitutional:      General: He is not in acute distress.    Appearance: He is well-developed. He is not diaphoretic.  Eyes:     General: No scleral icterus.    Conjunctiva/sclera: Conjunctivae normal.  Neck:     Musculoskeletal: Neck supple.     Thyroid: No thyromegaly.  Cardiovascular:     Rate and Rhythm: Normal rate and regular rhythm.     Heart sounds: Normal heart sounds. No murmur.  Pulmonary:     Effort: Pulmonary effort is normal. No respiratory distress.     Breath sounds: Normal breath sounds. No wheezing.  Musculoskeletal: Normal range of motion.  Lymphadenopathy:     Cervical: No cervical adenopathy.  Skin:    General: Skin is warm and dry.     Findings: No rash.  Neurological:     Mental Status: He is alert and oriented to person, place, and time.     Coordination: Coordination normal.  Psychiatric:        Behavior: Behavior normal.     Results for orders placed or performed in visit on 02/21/19  CMP14+EGFR  Result Value Ref Range   Glucose 74 65 - 99 mg/dL   BUN 17 6 - 24 mg/dL   Creatinine, Ser 1.26 0.76 - 1.27 mg/dL   GFR calc non Af Amer 65 >59 mL/min/1.73   GFR calc Af Amer 75 >59 mL/min/1.73    BUN/Creatinine Ratio 13 9 - 20   Sodium 140 134 - 144 mmol/L   Potassium 4.8 3.5 - 5.2 mmol/L   Chloride 101 96 - 106 mmol/L   CO2 19 (L) 20 - 29 mmol/L   Calcium 9.3 8.7 - 10.2 mg/dL   Total Protein 7.0 6.0 - 8.5 g/dL   Albumin 4.4 3.8 - 4.9 g/dL   Globulin, Total 2.6 1.5 - 4.5 g/dL   Albumin/Globulin Ratio 1.7 1.2 - 2.2   Bilirubin Total 0.3 0.0 - 1.2  mg/dL   Alkaline Phosphatase 67 39 - 117 IU/L   AST 24 0 - 40 IU/L   ALT 24 0 - 44 IU/L  Lipid panel  Result Value Ref Range   Cholesterol, Total 95 (L) 100 - 199 mg/dL   Triglycerides 141 0 - 149 mg/dL   HDL 24 (L) >39 mg/dL   VLDL Cholesterol Cal 25 5 - 40 mg/dL   LDL Chol Calc (NIH) 46 0 - 99 mg/dL   Chol/HDL Ratio 4.0 0.0 - 5.0 ratio  CBC with Differential/Platelet  Result Value Ref Range   WBC 8.8 3.4 - 10.8 x10E3/uL   RBC 4.69 4.14 - 5.80 x10E6/uL   Hemoglobin 13.7 13.0 - 17.7 g/dL   Hematocrit 41.4 37.5 - 51.0 %   MCV 88 79 - 97 fL   MCH 29.2 26.6 - 33.0 pg   MCHC 33.1 31.5 - 35.7 g/dL   RDW 13.2 11.6 - 15.4 %   Platelets 248 150 - 450 x10E3/uL   Neutrophils 65 Not Estab. %   Lymphs 23 Not Estab. %   Monocytes 8 Not Estab. %   Eos 3 Not Estab. %   Basos 1 Not Estab. %   Neutrophils Absolute 5.7 1.4 - 7.0 x10E3/uL   Lymphocytes Absolute 2.0 0.7 - 3.1 x10E3/uL   Monocytes Absolute 0.7 0.1 - 0.9 x10E3/uL   EOS (ABSOLUTE) 0.3 0.0 - 0.4 x10E3/uL   Basophils Absolute 0.1 0.0 - 0.2 x10E3/uL   Immature Granulocytes 0 Not Estab. %   Immature Grans (Abs) 0.0 0.0 - 0.1 x10E3/uL  Bayer DCA Hb A1c Waived  Result Value Ref Range   HB A1C (BAYER DCA - WAIVED) 6.2 <7.0 %    Assessment & Plan:   Problem List Items Addressed This Visit      Other   Gout   Relevant Medications   HYDROcodone-acetaminophen (NORCO) 7.5-325 MG tablet   Pain medication agreement signed   Relevant Medications   HYDROcodone-acetaminophen (NORCO) 7.5-325 MG tablet   Opiate dependence (HCC)   Relevant Medications   HYDROcodone-acetaminophen  (NORCO) 7.5-325 MG tablet    Other Visit Diagnoses    Colon cancer screening    -  Primary   Relevant Orders   Ambulatory referral to Gastroenterology      Continue hydrocodone and continue to see endocrinology. Follow up plan: Return in about 3 months (around 07/14/2019), or if symptoms worsen or fail to improve, for Pain management.  Counseling provided for all of the vaccine components Orders Placed This Encounter  Procedures  . Ambulatory referral to Gastroenterology    Caryl Pina, MD [facility] 04/13/2019, 9:28 AM

## 2019-04-13 NOTE — Addendum Note (Signed)
Addended by: Karle Plumber on: 04/13/2019 09:33 AM   Modules accepted: Orders

## 2019-04-20 ENCOUNTER — Telehealth: Payer: Self-pay | Admitting: *Deleted

## 2019-04-20 NOTE — Telephone Encounter (Addendum)
Prior Auth for Hydrocodone-Acetaminophen 7.5-325mg -APPROVED 04/25/19 through 10/22/19  PA# [id number]  [employer] form filled out, OV notes printed and on Dr Warrick Parisian desk for [employer].   Pharmacy notified.

## 2019-05-16 ENCOUNTER — Ambulatory Visit: Payer: Medicaid Other | Admitting: Nutrition

## 2019-05-27 ENCOUNTER — Encounter: Payer: Self-pay | Admitting: Family Medicine

## 2019-06-21 ENCOUNTER — Other Ambulatory Visit: Payer: Self-pay | Admitting: Family Medicine

## 2019-06-22 ENCOUNTER — Telehealth: Payer: Self-pay | Admitting: *Deleted

## 2019-06-22 NOTE — Telephone Encounter (Signed)
Jardiance Non Preferred by Medicaid- pt must try and fail or insufficient response to Metformin containing products unless contraindicated or documented adverse event

## 2019-06-23 NOTE — Telephone Encounter (Addendum)
Prior Auth for Jardiance 25mg -APPROVED till 06/22/20  PA# [id number]  Confirmation # [id number]  Pharmacy notified   I spoke with pt and he states he was on Metformin previously prior to coming here and his A1C wasn't controlled so Dr Warrick Parisian started him on Jardiance. Will work on [employer] for [employer].

## 2019-06-23 NOTE — Telephone Encounter (Signed)
I am almost 100% positive that he had tried Metformin and failed it because of side effects to it in the past but it is not in the electronic chart, this would be something we could ask him in and document it.

## 2019-07-13 ENCOUNTER — Other Ambulatory Visit: Payer: Self-pay

## 2019-07-14 ENCOUNTER — Ambulatory Visit: Payer: Medicaid Other | Admitting: Family Medicine

## 2019-07-14 ENCOUNTER — Encounter: Payer: Self-pay | Admitting: Family Medicine

## 2019-07-14 ENCOUNTER — Other Ambulatory Visit: Payer: Self-pay

## 2019-07-14 VITALS — BP 130/79 | HR 71 | Temp 94.8°F | Ht 76.45 in | Wt 271.4 lb

## 2019-07-14 DIAGNOSIS — F1120 Opioid dependence, uncomplicated: Secondary | ICD-10-CM

## 2019-07-14 DIAGNOSIS — L84 Corns and callosities: Secondary | ICD-10-CM | POA: Diagnosis not present

## 2019-07-14 DIAGNOSIS — I10 Essential (primary) hypertension: Secondary | ICD-10-CM

## 2019-07-14 DIAGNOSIS — E11628 Type 2 diabetes mellitus with other skin complications: Secondary | ICD-10-CM | POA: Diagnosis not present

## 2019-07-14 DIAGNOSIS — E785 Hyperlipidemia, unspecified: Secondary | ICD-10-CM

## 2019-07-14 DIAGNOSIS — M1A0720 Idiopathic chronic gout, left ankle and foot, without tophus (tophi): Secondary | ICD-10-CM | POA: Diagnosis not present

## 2019-07-14 DIAGNOSIS — Z1211 Encounter for screening for malignant neoplasm of colon: Secondary | ICD-10-CM

## 2019-07-14 DIAGNOSIS — E1169 Type 2 diabetes mellitus with other specified complication: Secondary | ICD-10-CM | POA: Diagnosis not present

## 2019-07-14 DIAGNOSIS — E1159 Type 2 diabetes mellitus with other circulatory complications: Secondary | ICD-10-CM

## 2019-07-14 DIAGNOSIS — Z0289 Encounter for other administrative examinations: Secondary | ICD-10-CM

## 2019-07-14 LAB — BAYER DCA HB A1C WAIVED: HB A1C (BAYER DCA - WAIVED): 6.5 % (ref <7.0)

## 2019-07-14 MED ORDER — HYDROCODONE-ACETAMINOPHEN 7.5-325 MG PO TABS: 0.5000 | ORAL_TABLET | Freq: Two times a day (BID) | ORAL | 0 refills | Status: DC | PRN

## 2019-07-14 NOTE — Progress Notes (Signed)
BP 130/79   Pulse 71   Temp (!) 94.8 F (34.9 C) (Temporal)   Ht 6' 4.45" (1.942 m)   Wt 271 lb 6.4 oz (123.1 kg)   SpO2 99%   BMI 32.65 kg/m    Subjective:   Patient ID: Evan [name], male    DOB: 08/30/1965, 54 y.o.   MRN: [id number]  HPI: Evan [name] is a 54 y.o. male presenting on 07/14/2019 for Diabetes (3 month follow up)   HPI Pain assessment: Cause of pain-degenerative disc disease and osteoarthritis Pain location-low back and bilateral lower extremities Pain on scale of 1-10-5 Frequency-Daily What increases pain-weather changes What makes pain Better-medication helps Effects on ADL -on disability Any change in general medical condition-none  Current opioids rx-hydrocodone-acetaminophen 7.5-325 daily as needed # meds rx-45, typically last 3 months Effectiveness of current meds-works well, does not use every day Adverse reactions form pain meds-none Morphine equivalent-7/2-15  Pill count performed-No Last drug screen -10/20/2018 ( high risk q75m moderate risk q621mlow risk yearly ) Urine drug screen today- No [place]  If yes were their any concerning findings? -None  No flowsheet data found.   Pain contract signed on: 10/20/2018  Type 2 diabetes mellitus Patient comes in today for recheck of his diabetes. Patient has been currently taking Levemir and Jardiance. Patient is currently on an ACE inhibitor/ARB. Patient has not seen an ophthalmologist this year. Patient denies any issues with their feet.   Hypertension Patient is currently on losartan and spironolactone, and their blood pressure today is 130/79. Patient denies any lightheadedness or dizziness. Patient denies headaches, blurred vision, chest pains, shortness of breath, or weakness. Denies any side effects from medication and is content with current medication.   Hyperlipidemia Patient is coming in for recheck of his hyperlipidemia. The patient is currently taking atorvastatin  and omega-3's. They deny any issues with myalgias or history of liver damage from it. They deny any focal numbness or weakness or chest pain.   Gout Last attack: Unknown Attacks this year: None Medication: None currently Location of attacks: Both feet  Relevant past medical, surgical, family and social history reviewed and updated as indicated. Interim medical history since our last visit reviewed. Allergies and medications reviewed and updated.  Review of Systems  Constitutional: Negative for chills and fever.  Eyes: Negative for visual disturbance.  Respiratory: Negative for shortness of breath and wheezing.   Cardiovascular: Negative for chest pain and leg swelling.  Musculoskeletal: Positive for arthralgias, back pain and myalgias. Negative for gait problem.  Skin: Negative for rash.  Neurological: Negative for dizziness, weakness and light-headedness.  All other systems reviewed and are negative.   Per HPI unless specifically indicated above   Allergies as of 07/14/2019   No Known Allergies     Medication List       Accurate as of July 14, 2019 11:59 PM. If you have any questions, ask your nurse or doctor.        Accu-Chek Aviva Plus test strip Generic drug: glucose blood USE TO CHECK SUGAR TWICE DAILY   aspirin EC 81 MG tablet Take 81 mg by mouth daily.   atorvastatin 80 MG tablet Commonly known as: LIPITOR Take 1 tablet (80 mg total) by mouth daily at 6 PM.   BD Pen Needle Nano U/F 32G X 4 MM Misc Generic drug: Insulin Pen Needle USE DAILY WITH INSULIN   carvedilol 12.5 MG tablet Commonly known as: COREG Take 1 tablet (  12.5 mg total) by mouth 2 (two) times daily with a meal.   famotidine 20 MG tablet Commonly known as: Pepcid Take 1 tablet (20 mg total) by mouth 2 (two) times daily.   fenofibrate 145 MG tablet Commonly known as: TRICOR Take 1 tablet (145 mg total) by mouth daily.   Fish Oil 1000 MG Caps Take 2,000 mg by mouth daily.     fluticasone 50 MCG/ACT nasal spray Commonly known as: FLONASE Place 1 spray into both nostrils daily.   furosemide 20 MG tablet Commonly known as: LASIX Take 1 tablet (20 mg total) by mouth daily.   HYDROcodone-acetaminophen 7.5-325 MG tablet Commonly known as: NORCO Take 0.5-1 tablets by mouth 2 (two) times daily as needed for severe pain.   Insulin Detemir 100 UNIT/ML Pen Commonly known as: Levemir FlexTouch INJECT 30 UNITS DAILY AT BEDTIME - UP TO 50 UNITS AS NEEDED   Jardiance 25 MG Tabs tablet Generic drug: empagliflozin Take 25 mg by mouth daily.   losartan 25 MG tablet Commonly known as: COZAAR Take 1 tablet (25 mg total) by mouth at bedtime.   nitroGLYCERIN 0.4 MG SL tablet Commonly known as: NITROSTAT Place 1 tablet (0.4 mg total) under the tongue every 5 (five) minutes x 3 doses as needed for chest pain.   potassium chloride 10 MEQ tablet Commonly known as: KLOR-CON TAKE 1 TABLET BY MOUTH EVERY DAY   spironolactone 25 MG tablet Commonly known as: ALDACTONE Take 0.5 tablets (12.5 mg total) by mouth daily.   ticagrelor 90 MG Tabs tablet Commonly known as: Brilinta TAKE 1 TABLET (90 MG TOTAL) BY MOUTH 2 (TWO) TIMES DAILY.        Objective:   BP 130/79   Pulse 71   Temp (!) 94.8 F (34.9 C) (Temporal)   Ht 6' 4.45" (1.942 m)   Wt 271 lb 6.4 oz (123.1 kg)   SpO2 99%   BMI 32.65 kg/m   Wt Readings from Last 3 Encounters:  07/14/19 271 lb 6.4 oz (123.1 kg)  04/13/19 252 lb 9.6 oz (114.6 kg)  03/25/19 253 lb (114.8 kg)    Physical Exam Vitals and nursing note reviewed.  Constitutional:      General: He is not in acute distress.    Appearance: He is well-developed. He is not diaphoretic.  Eyes:     General: No scleral icterus.    Conjunctiva/sclera: Conjunctivae normal.  Neck:     Thyroid: No thyromegaly.  Cardiovascular:     Rate and Rhythm: Normal rate and regular rhythm.     Heart sounds: Normal heart sounds. No murmur.  Pulmonary:      Effort: Pulmonary effort is normal. No respiratory distress.     Breath sounds: Normal breath sounds. No wheezing.  Skin:    General: Skin is warm and dry.     Findings: No rash.  Neurological:     Mental Status: He is alert and oriented to person, place, and time.     Coordination: Coordination normal.  Psychiatric:        Behavior: Behavior normal.       Assessment & Plan:   Problem List Items Addressed This Visit      Cardiovascular and Mediastinum   Hypertension associated with diabetes ([place])   Relevant Orders   CMP14+EGFR (Completed)     Endocrine   Type 2 diabetes mellitus with pressure callus ([place]) - Primary   Relevant Orders   Microalbumin / creatinine urine ratio   Bayer DCA  Hb A1c Waived (Completed)   Hyperlipidemia associated with type 2 diabetes mellitus (HCC)   Relevant Orders   Lipid panel (Completed)     Other   Gout   Relevant Medications   HYDROcodone-acetaminophen (NORCO) 7.5-325 MG tablet   Pain medication agreement signed   Relevant Medications   HYDROcodone-acetaminophen (NORCO) 7.5-325 MG tablet   Opiate dependence ([place])   Relevant Medications   HYDROcodone-acetaminophen (NORCO) 7.5-325 MG tablet    Other Visit Diagnoses    Colon cancer screening       Relevant Orders   Ambulatory referral to Gastroenterology      Continue hydrocodone and will do blood work, continue current medication, seems to be mostly controlled. Follow up plan: Return in about 3 months (around 10/11/2019), or if symptoms worsen or fail to improve, for Hypertension and diabetes and pain management.  Counseling provided for all of the vaccine components Orders Placed This Encounter  Procedures  . Microalbumin / creatinine urine ratio  . Bayer DCA Hb A1c Waived  . CMP14+EGFR  . Lipid panel  . Ambulatory referral to Gastroenterology    Caryl Pina, MD [facility] 07/18/2019, 9:27 PM

## 2019-07-15 LAB — CMP14+EGFR
ALT: 14 IU/L (ref 0–44)
AST: 14 IU/L (ref 0–40)
Albumin/Globulin Ratio: 1.5 (ref 1.2–2.2)
Albumin: 4.1 g/dL (ref 3.8–4.9)
Alkaline Phosphatase: 65 IU/L (ref 39–117)
BUN/Creatinine Ratio: 25 — ABNORMAL HIGH (ref 9–20)
BUN: 30 mg/dL — ABNORMAL HIGH (ref 6–24)
Bilirubin Total: 0.3 mg/dL (ref 0.0–1.2)
CO2: 19 mmol/L — ABNORMAL LOW (ref 20–29)
Calcium: 9.1 mg/dL (ref 8.7–10.2)
Chloride: 104 mmol/L (ref 96–106)
Creatinine, Ser: 1.22 mg/dL (ref 0.76–1.27)
GFR calc Af Amer: 78 mL/min/{1.73_m2} (ref >59)
GFR calc non Af Amer: 67 mL/min/{1.73_m2} (ref >59)
Globulin, Total: 2.8 g/dL (ref 1.5–4.5)
Glucose: 193 mg/dL — ABNORMAL HIGH (ref 65–99)
Potassium: 4.3 mmol/L (ref 3.5–5.2)
Sodium: 142 mmol/L (ref 134–144)
Total Protein: 6.9 g/dL (ref 6.0–8.5)

## 2019-07-15 LAB — LIPID PANEL
Chol/HDL Ratio: 3.9 ratio (ref 0.0–5.0)
Cholesterol, Total: 132 mg/dL (ref 100–199)
HDL: 34 mg/dL — ABNORMAL LOW (ref >39)
LDL Chol Calc (NIH): 72 mg/dL (ref 0–99)
Triglycerides: 150 mg/dL — ABNORMAL HIGH (ref 0–149)
VLDL Cholesterol Cal: 26 mg/dL (ref 5–40)

## 2019-09-21 ENCOUNTER — Ambulatory Visit: Payer: Medicaid Other | Admitting: Cardiovascular Disease

## 2019-09-23 ENCOUNTER — Other Ambulatory Visit: Payer: Self-pay

## 2019-09-23 ENCOUNTER — Encounter: Payer: Self-pay | Admitting: Cardiovascular Disease

## 2019-09-23 ENCOUNTER — Ambulatory Visit (INDEPENDENT_AMBULATORY_CARE_PROVIDER_SITE_OTHER): Payer: Medicaid Other | Admitting: Cardiovascular Disease

## 2019-09-23 VITALS — BP 106/70 | HR 69 | Ht 76.5 in | Wt 277.0 lb

## 2019-09-23 DIAGNOSIS — I255 Ischemic cardiomyopathy: Secondary | ICD-10-CM

## 2019-09-23 DIAGNOSIS — E1169 Type 2 diabetes mellitus with other specified complication: Secondary | ICD-10-CM | POA: Diagnosis not present

## 2019-09-23 DIAGNOSIS — E1159 Type 2 diabetes mellitus with other circulatory complications: Secondary | ICD-10-CM

## 2019-09-23 DIAGNOSIS — I10 Essential (primary) hypertension: Secondary | ICD-10-CM | POA: Diagnosis not present

## 2019-09-23 DIAGNOSIS — Z9861 Coronary angioplasty status: Secondary | ICD-10-CM

## 2019-09-23 DIAGNOSIS — E785 Hyperlipidemia, unspecified: Secondary | ICD-10-CM | POA: Diagnosis not present

## 2019-09-23 DIAGNOSIS — I2510 Atherosclerotic heart disease of native coronary artery without angina pectoris: Secondary | ICD-10-CM

## 2019-09-23 DIAGNOSIS — E669 Obesity, unspecified: Secondary | ICD-10-CM | POA: Diagnosis not present

## 2019-09-23 NOTE — Assessment & Plan Note (Signed)
History obesity having gained 25 pound since I saw him 6 months ago which he attributes to increased and working out and muscle weight

## 2019-09-23 NOTE — Assessment & Plan Note (Signed)
History of essential hypertension with blood pressure measured today at 106/70.  He is on carvedilol and losartan.

## 2019-09-23 NOTE — Patient Instructions (Signed)

## 2019-09-23 NOTE — Assessment & Plan Note (Signed)
History of CAD status post delayed presentation of acute anterolateral ST segment elevation myocardial infarction 11/24/2018.  He had intervention by Dr. Fletcher Anon to his proximal LAD with staged intervention to his RCA several days later.  He is on aspirin and Brilinta.

## 2019-09-23 NOTE — Assessment & Plan Note (Signed)
History of ischemic cardiomyopathy with an EF initially at the time of his intervention of 20 to 25%.  He was sent home on guideline directed optimal medical therapy for LV dysfunction as well as a LifeVest.  Ultimately, his EF improved up to 35 to 40% and his LifeVest was discontinued.  He has no symptoms of heart failure.

## 2019-09-23 NOTE — Progress Notes (Signed)
09/23/2019 Evan [name]   [birth year]  UN:[id number]  Primary Physician Dettinger, Fransisca Kaufmann, MD Primary Cardiologist: Lorretta Harp MD FACP, [place], [place], [place]  HPI:  Evan [name] is a 54 y.o.  moderate to severely overweight divorced Caucasian male father of 2, grandfather of one grandchild who is partially disabled. His primary care provider is Dr. Warrick Parisian.  I last saw him in the office 03/25/2019.His risk factors include treated hypertension hyperlipidemia as well as family history. He had a delayed presentation of anterolateral myocardial infarction 11/24/2018 and had intervention by Dr. Zoila Shutter his proximal LAD. He had staged RCA intervention several days later. His EF was in the 20 to 25% range by 2D echo and he was discharged home on appropriate pharmacology and a LifeVest. He is making lifestyle changes, has lost weight and is eating a more healthy diet.   He is mowing lawns, working out and walk frequently without limitation.  His most recent 2D echocardiogram performed after pharmacologic therapy for LV dysfunction on 02/23/2019 revealed an improvement in his EF from 20 to 25% up to 35 to 40%.  Based on this I suggested that we discontinue the LifeVest.  He says that his lifestyle change.  Is eating habits have as well.  His most recent lipid profile performed 07/14/2019 revealed total cholesterol 132, LDL 72 HDL 34.  Since I saw him 6 months ago he continues to do well.  He is very active and has no symptoms.  He has gained 25 pounds however which he attributes to working out/muscle weight.  Current Meds  Medication Sig  . ACCU-CHEK AVIVA PLUS test strip USE TO CHECK SUGAR TWICE DAILY  . aspirin EC 81 MG tablet Take 81 mg by mouth daily.  Marland Kitchen atorvastatin (LIPITOR) 80 MG tablet Take 1 tablet (80 mg total) by mouth daily at 6 PM.  . BD PEN NEEDLE NANO U/F 32G X 4 MM MISC USE DAILY WITH INSULIN  . carvedilol (COREG) 12.5 MG tablet Take 1 tablet (12.5 mg total) by mouth 2 (two)  times daily with a meal.  . famotidine (PEPCID) 20 MG tablet Take 1 tablet (20 mg total) by mouth 2 (two) times daily.  . fenofibrate (TRICOR) 145 MG tablet Take 1 tablet (145 mg total) by mouth daily.  . fluticasone (FLONASE) 50 MCG/ACT nasal spray Place 1 spray into both nostrils daily.  . furosemide (LASIX) 20 MG tablet Take 1 tablet (20 mg total) by mouth daily.  Marland Kitchen HYDROcodone-acetaminophen (NORCO) 7.5-325 MG tablet Take 0.5-1 tablets by mouth 2 (two) times daily as needed for severe pain.  . Insulin Detemir (LEVEMIR FLEXTOUCH) 100 UNIT/ML Pen INJECT 30 UNITS DAILY AT BEDTIME - UP TO 50 UNITS AS NEEDED  . JARDIANCE 25 MG TABS tablet Take 25 mg by mouth daily.  Marland Kitchen losartan (COZAAR) 25 MG tablet Take 1 tablet (25 mg total) by mouth at bedtime.  . nitroGLYCERIN (NITROSTAT) 0.4 MG SL tablet Place 1 tablet (0.4 mg total) under the tongue every 5 (five) minutes x 3 doses as needed for chest pain.  . Omega-3 Fatty Acids (FISH OIL) 1000 MG CAPS Take 2,000 mg by mouth daily.  . potassium chloride (KLOR-CON) 10 MEQ tablet TAKE 1 TABLET BY MOUTH EVERY DAY  . spironolactone (ALDACTONE) 25 MG tablet Take 0.5 tablets (12.5 mg total) by mouth daily.  . ticagrelor (BRILINTA) 90 MG TABS tablet TAKE 1 TABLET (90 MG TOTAL) BY MOUTH 2 (TWO) TIMES DAILY.     No  Known Allergies  Social History   Socioeconomic History  . Marital status: Single    Spouse name: Not on file  . Number of children: Not on file  . Years of education: Not on file  . Highest education level: Not on file  Occupational History  . Not on file  Tobacco Use  . Smoking status: Never Smoker  . Smokeless tobacco: Never Used  Substance and Sexual Activity  . Alcohol use: Yes    Alcohol/week: 12.0 standard drinks    Types: 12 Cans of beer per week  . Drug use: No  . Sexual activity: Yes    Comment: male 8 months partner  Other Topics Concern  . Not on file  Social History Narrative  . Not on file   Social Determinants of  Health   Financial Resource Strain:   . Difficulty of Paying Living Expenses:   Food Insecurity:   . Worried About Charity fundraiser in the Last Year:   . Arboriculturist in the Last Year:   Transportation Needs:   . Film/video editor (Medical):   Marland Kitchen Lack of Transportation (Non-Medical):   Physical Activity:   . Days of Exercise per Week:   . Minutes of Exercise per Session:   Stress:   . Feeling of Stress :   Social Connections:   . Frequency of Communication with Friends and Family:   . Frequency of Social Gatherings with Friends and Family:   . Attends Religious Services:   . Active Member of Clubs or Organizations:   . Attends Archivist Meetings:   Marland Kitchen Marital Status:   Intimate Partner Violence:   . Fear of Current or Ex-Partner:   . Emotionally Abused:   Marland Kitchen Physically Abused:   . Sexually Abused:      Review of Systems: General: negative for chills, fever, night sweats or weight changes.  Cardiovascular: negative for chest pain, dyspnea on exertion, edema, orthopnea, palpitations, paroxysmal nocturnal dyspnea or shortness of breath Dermatological: negative for rash Respiratory: negative for cough or wheezing Urologic: negative for hematuria Abdominal: negative for nausea, vomiting, diarrhea, bright red blood per rectum, melena, or hematemesis Neurologic: negative for visual changes, syncope, or dizziness All other systems reviewed and are otherwise negative except as noted above.    Blood pressure 106/70, pulse 69, height 6' 4.5" (1.943 m), weight 277 lb (125.6 kg), SpO2 96 %.  General appearance: alert and no distress Neck: no adenopathy, no carotid bruit, no JVD, supple, symmetrical, trachea midline and thyroid not enlarged, symmetric, no tenderness/mass/nodules Lungs: clear to auscultation bilaterally Heart: regular rate and rhythm, S1, S2 normal, no murmur, click, rub or gallop Extremities: extremities normal, atraumatic, no cyanosis or  edema Pulses: 2+ and symmetric Skin: Skin color, texture, turgor normal. No rashes or lesions Neurologic: Alert and oriented X 3, normal strength and tone. Normal symmetric reflexes. Normal coordination and gait  EKG sinus rhythm at 69 with Q waves in V1 through V4 consistent with anterolateral infarct.  I personally reviewed this EKG.  ASSESSMENT AND PLAN:   Hypertension associated with diabetes ([place]) History of essential hypertension with blood pressure measured today at 106/70.  He is on carvedilol and losartan.  Hyperlipidemia associated with type 2 diabetes mellitus ([place]) History of hyperlipidemia on high-dose statin therapy with lipid profile performed 07/14/2019 revealing a total cholesterol 132, LDL 72 and HDL 34.  Obesity (BMI 30.0-34.9) History obesity having gained 25 pound since I saw him 6 months ago  which he attributes to increased and working out and muscle weight  CAD S/P PCI History of CAD status post delayed presentation of acute anterolateral ST segment elevation myocardial infarction 11/24/2018.  He had intervention by Dr. Fletcher Anon to his proximal LAD with staged intervention to his RCA several days later.  He is on aspirin and Brilinta.  Ischemic cardiomyopathy History of ischemic cardiomyopathy with an EF initially at the time of his intervention of 20 to 25%.  He was sent home on guideline directed optimal medical therapy for LV dysfunction as well as a LifeVest.  Ultimately, his EF improved up to 35 to 40% and his LifeVest was discontinued.  He has no symptoms of heart failure.      Lorretta Harp MD FACP,FACC,FAHA, [facility] 09/23/2019 1:52 PM

## 2019-09-23 NOTE — Assessment & Plan Note (Signed)
History of hyperlipidemia on high-dose statin therapy with lipid profile performed 07/14/2019 revealing a total cholesterol 132, LDL 72 and HDL 34.

## 2019-10-12 ENCOUNTER — Ambulatory Visit: Payer: Medicaid Other | Admitting: Family Medicine

## 2019-10-12 ENCOUNTER — Encounter: Payer: Self-pay | Admitting: Family Medicine

## 2019-10-12 ENCOUNTER — Other Ambulatory Visit: Payer: Self-pay

## 2019-10-12 VITALS — BP 111/67 | HR 76 | Temp 97.8°F | Ht 76.5 in | Wt 278.1 lb

## 2019-10-12 DIAGNOSIS — E785 Hyperlipidemia, unspecified: Secondary | ICD-10-CM

## 2019-10-12 DIAGNOSIS — E1159 Type 2 diabetes mellitus with other circulatory complications: Secondary | ICD-10-CM | POA: Diagnosis not present

## 2019-10-12 DIAGNOSIS — F1120 Opioid dependence, uncomplicated: Secondary | ICD-10-CM

## 2019-10-12 DIAGNOSIS — E11628 Type 2 diabetes mellitus with other skin complications: Secondary | ICD-10-CM

## 2019-10-12 DIAGNOSIS — E1169 Type 2 diabetes mellitus with other specified complication: Secondary | ICD-10-CM

## 2019-10-12 DIAGNOSIS — E669 Obesity, unspecified: Secondary | ICD-10-CM

## 2019-10-12 DIAGNOSIS — L84 Corns and callosities: Secondary | ICD-10-CM | POA: Diagnosis not present

## 2019-10-12 DIAGNOSIS — Z0289 Encounter for other administrative examinations: Secondary | ICD-10-CM

## 2019-10-12 DIAGNOSIS — K2100 Gastro-esophageal reflux disease with esophagitis, without bleeding: Secondary | ICD-10-CM

## 2019-10-12 DIAGNOSIS — I10 Essential (primary) hypertension: Secondary | ICD-10-CM

## 2019-10-12 DIAGNOSIS — M1A0720 Idiopathic chronic gout, left ankle and foot, without tophus (tophi): Secondary | ICD-10-CM | POA: Diagnosis not present

## 2019-10-12 DIAGNOSIS — E66811 Obesity, class 1: Secondary | ICD-10-CM

## 2019-10-12 LAB — BAYER DCA HB A1C WAIVED: HB A1C (BAYER DCA - WAIVED): 6.5 % (ref <7.0)

## 2019-10-12 MED ORDER — HYDROCODONE-ACETAMINOPHEN 7.5-325 MG PO TABS: 0.5000 | ORAL_TABLET | Freq: Two times a day (BID) | ORAL | 0 refills | Status: DC | PRN

## 2019-10-12 NOTE — Progress Notes (Signed)
BP 111/67   Pulse 76   Temp 97.8 F (36.6 C) (Temporal)   Ht 6' 4.5" (1.943 m)   Wt 278 lb 2 oz (126.2 kg)   BMI 33.41 kg/m    Subjective:   Patient ID: Evan [name], male    DOB: 09/19/1965, 54 y.o.   MRN: [id number]  HPI: Evan [name] is a 54 y.o. male presenting on 10/12/2019 for Medical Management of Chronic Issues   HPI Pain assessment: Cause of pain-back pain, chronic and intermittent Pain location-lower back Pain on scale of 1-10- 2 Frequency-not daily but some days he will get a catch from doing something or lifting something that he should not What increases pain-lifting certain heavy things or certain movements What makes pain Better-stretching and the occasional hydrocodone Effects on ADL -none Any change in general medical condition-none  Current opioids rx-hydrocodone 7.5-325- 1/2 to 1 tablet twice daily as needed # meds rx-40, usually lasts a 65-monthcycle Effectiveness of current meds-works well Adverse reactions form pain meds-none Morphine equivalent-7.5  Pill count performed-No Last drug screen -10/20/2018 ( high risk [id number] risk [id number] risk yearly ) Urine drug [place] today- Yes [place]  If yes were their any concerning findings? -None    No flowsheet data found.   Pain contract signed on: 10/20/2018  Type 2 diabetes mellitus Patient comes in today for recheck of his diabetes. Patient has been currently taking Levemir 30 daily and Jardiance, A1c is 6.5.. Patient is currently on an ACE inhibitor/ARB. Patient has not seen an ophthalmologist this year. Patient denies any issues with their feet. The symptom started onset as an adult hypertension hyperlipidemia and gout and GERD ARE RELATED TO DM   Hypertension Patient is currently on carvedilol and furosemide as needed and losartan and spironolactone, and their blood pressure today is 111/67. Patient denies any lightheadedness or dizziness. Patient denies headaches,  blurred vision, chest pains, shortness of breath, or weakness. Denies any side effects from medication and is content with current medication.   Hyperlipidemia Patient is coming in for recheck of his hyperlipidemia. The patient is currently taking atorvastatin and fish oil. They deny any issues with myalgias or history of liver damage from it. They deny any focal numbness or weakness or chest pain.   GERD Patient is currently on Pepcid.  She denies any major symptoms or abdominal pain or belching or burping. She denies any blood in her stool or lightheadedness or dizziness.   Gout Last attack: 6 months Attacks this year: 2 Medication: None currently, over-the-counter anti-inflammatories Location of attacks: Ankle and feet on right foot mostly  Relevant past medical, surgical, family and social history reviewed and updated as indicated. Interim medical history since our last visit reviewed. Allergies and medications reviewed and updated.  Review of Systems  Constitutional: Negative for chills and fever.  Eyes: Negative for visual disturbance.  Respiratory: Negative for shortness of breath and wheezing.   Cardiovascular: Negative for chest pain and leg swelling.  Musculoskeletal: Negative for back pain and gait problem.  Skin: Negative for rash.  Neurological: Negative for dizziness, weakness and light-headedness.  All other systems reviewed and are negative.   Per HPI unless specifically indicated above   Allergies as of 10/12/2019   No Known Allergies     Medication List       Accurate as of Oct 12, 2019  9:47 AM. If you have any questions, ask your nurse or doctor.  Accu-Chek Aviva Plus test strip Generic drug: glucose blood USE TO CHECK SUGAR TWICE DAILY   aspirin EC 81 MG tablet Take 81 mg by mouth daily.   atorvastatin 80 MG tablet Commonly known as: LIPITOR Take 1 tablet (80 mg total) by mouth daily at 6 PM.   BD Pen Needle Nano U/F 32G X 4 MM  Misc Generic drug: Insulin Pen Needle USE DAILY WITH INSULIN   carvedilol 12.5 MG tablet Commonly known as: COREG Take 1 tablet (12.5 mg total) by mouth 2 (two) times daily with a meal.   famotidine 20 MG tablet Commonly known as: Pepcid Take 1 tablet (20 mg total) by mouth 2 (two) times daily.   fenofibrate 145 MG tablet Commonly known as: TRICOR Take 1 tablet (145 mg total) by mouth daily.   Fish Oil 1000 MG Caps Take 2,000 mg by mouth daily.   fluticasone 50 MCG/ACT nasal spray Commonly known as: FLONASE Place 1 spray into both nostrils daily.   furosemide 20 MG tablet Commonly known as: LASIX Take 1 tablet (20 mg total) by mouth daily.   HYDROcodone-acetaminophen 7.5-325 MG tablet Commonly known as: NORCO Take 0.5-1 tablets by mouth 2 (two) times daily as needed for severe pain.   insulin detemir 100 UNIT/ML FlexPen Commonly known as: Levemir FlexTouch INJECT 30 UNITS DAILY AT BEDTIME - UP TO 50 UNITS AS NEEDED   Jardiance 25 MG Tabs tablet Generic drug: empagliflozin Take 25 mg by mouth daily.   losartan 25 MG tablet Commonly known as: COZAAR Take 1 tablet (25 mg total) by mouth at bedtime.   nitroGLYCERIN 0.4 MG SL tablet Commonly known as: NITROSTAT Place 1 tablet (0.4 mg total) under the tongue every 5 (five) minutes x 3 doses as needed for chest pain.   potassium chloride 10 MEQ tablet Commonly known as: KLOR-CON TAKE 1 TABLET BY MOUTH EVERY DAY   spironolactone 25 MG tablet Commonly known as: ALDACTONE Take 0.5 tablets (12.5 mg total) by mouth daily.   ticagrelor 90 MG Tabs tablet Commonly known as: Brilinta TAKE 1 TABLET (90 MG TOTAL) BY MOUTH 2 (TWO) TIMES DAILY.        Objective:   BP 111/67   Pulse 76   Temp 97.8 F (36.6 C) (Temporal)   Ht 6' 4.5" (1.943 m)   Wt 278 lb 2 oz (126.2 kg)   BMI 33.41 kg/m   Wt Readings from Last 3 Encounters:  10/12/19 278 lb 2 oz (126.2 kg)  09/23/19 277 lb (125.6 kg)  07/14/19 271 lb 6.4 oz  (123.1 kg)    Physical Exam Vitals and nursing note reviewed.  Constitutional:      General: He is not in acute distress.    Appearance: He is well-developed. He is not diaphoretic.  Eyes:     General: No scleral icterus.    Conjunctiva/sclera: Conjunctivae normal.  Neck:     Thyroid: No thyromegaly.  Cardiovascular:     Rate and Rhythm: Normal rate and regular rhythm.     Heart sounds: Normal heart sounds. No murmur.  Pulmonary:     Effort: Pulmonary effort is normal. No respiratory distress.     Breath sounds: Normal breath sounds. No wheezing.  Musculoskeletal:        General: Normal range of motion.     Cervical back: Neck supple.  Lymphadenopathy:     Cervical: No cervical adenopathy.  Skin:    General: Skin is warm and dry.     Findings: No  rash.  Neurological:     Mental Status: He is alert and oriented to person, place, and time.     Coordination: Coordination normal.  Psychiatric:        Behavior: Behavior normal.       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) - Primary   Relevant Orders   Microalbumin / creatinine urine ratio   Bayer DCA Hb A1c Waived   BMP8+EGFR   Hyperlipidemia associated with type 2 diabetes mellitus ([place])     Other   Gout   Relevant Medications   HYDROcodone-acetaminophen (NORCO) 7.5-325 MG tablet   Other Relevant Orders   Uric acid   Obesity (BMI 30.0-34.9)   Pain medication agreement signed   Relevant Medications   HYDROcodone-acetaminophen (NORCO) 7.5-325 MG tablet   Other Relevant Orders   ToxASSURE Select 13 (MW), Urine   Opiate dependence ([place])   Relevant Medications   HYDROcodone-acetaminophen (NORCO) 7.5-325 MG tablet    Continue current medication, no changes, A1c looks good at  6.5.  Follow up plan: Return in about 3 months (around 01/12/2020), or if symptoms worsen or fail to improve, for Diabetes and pain management.  Counseling provided for all of the vaccine components Orders Placed This Encounter  Procedures  . Microalbumin / creatinine urine ratio  . Bayer DCA Hb A1c Waived  . ToxASSURE Select 13 (MW), Urine    Caryl Pina, MD [facility] 10/12/2019, 9:47 AM

## 2019-10-13 LAB — MICROALBUMIN / CREATININE URINE RATIO
Creatinine, Urine: 98.3 mg/dL
Microalb/Creat Ratio: 3 mg/g creat (ref 0–29)
Microalbumin, Urine: 3.1 ug/mL

## 2019-10-13 LAB — BMP8+EGFR
BUN/Creatinine Ratio: 15 (ref 9–20)
BUN: 19 mg/dL (ref 6–24)
CO2: 19 mmol/L — ABNORMAL LOW (ref 20–29)
Calcium: 9.1 mg/dL (ref 8.7–10.2)
Chloride: 101 mmol/L (ref 96–106)
Creatinine, Ser: 1.24 mg/dL (ref 0.76–1.27)
GFR calc Af Amer: 76 mL/min/{1.73_m2} (ref >59)
GFR calc non Af Amer: 66 mL/min/{1.73_m2} (ref >59)
Glucose: 143 mg/dL — ABNORMAL HIGH (ref 65–99)
Potassium: 4 mmol/L (ref 3.5–5.2)
Sodium: 139 mmol/L (ref 134–144)

## 2019-10-13 LAB — URIC ACID: Uric Acid: 7.1 mg/dL (ref 3.8–8.4)

## 2019-10-15 LAB — TOXASSURE SELECT 13 (MW), URINE

## 2019-10-27 ENCOUNTER — Other Ambulatory Visit: Payer: Self-pay | Admitting: Cardiovascular Disease

## 2019-10-27 ENCOUNTER — Other Ambulatory Visit: Payer: Self-pay | Admitting: Family Medicine

## 2019-12-07 ENCOUNTER — Other Ambulatory Visit: Payer: Self-pay | Admitting: Cardiovascular Disease

## 2019-12-10 ENCOUNTER — Other Ambulatory Visit: Payer: Self-pay | Admitting: Family Medicine

## 2020-01-12 ENCOUNTER — Ambulatory Visit: Payer: Medicaid Other | Admitting: Family Medicine

## 2020-01-12 ENCOUNTER — Other Ambulatory Visit: Payer: Self-pay

## 2020-01-12 ENCOUNTER — Encounter: Payer: Self-pay | Admitting: Family Medicine

## 2020-01-12 VITALS — BP 129/81 | HR 66 | Temp 97.9°F | Ht 76.5 in | Wt 289.0 lb

## 2020-01-12 DIAGNOSIS — E11628 Type 2 diabetes mellitus with other skin complications: Secondary | ICD-10-CM

## 2020-01-12 DIAGNOSIS — I10 Essential (primary) hypertension: Secondary | ICD-10-CM | POA: Diagnosis not present

## 2020-01-12 DIAGNOSIS — E1159 Type 2 diabetes mellitus with other circulatory complications: Secondary | ICD-10-CM

## 2020-01-12 DIAGNOSIS — L84 Corns and callosities: Secondary | ICD-10-CM | POA: Diagnosis not present

## 2020-01-12 DIAGNOSIS — E119 Type 2 diabetes mellitus without complications: Secondary | ICD-10-CM

## 2020-01-12 DIAGNOSIS — R0789 Other chest pain: Secondary | ICD-10-CM

## 2020-01-12 DIAGNOSIS — Z86711 Personal history of pulmonary embolism: Secondary | ICD-10-CM

## 2020-01-12 DIAGNOSIS — E1169 Type 2 diabetes mellitus with other specified complication: Secondary | ICD-10-CM

## 2020-01-12 DIAGNOSIS — E785 Hyperlipidemia, unspecified: Secondary | ICD-10-CM | POA: Diagnosis not present

## 2020-01-12 DIAGNOSIS — K2100 Gastro-esophageal reflux disease with esophagitis, without bleeding: Secondary | ICD-10-CM | POA: Diagnosis not present

## 2020-01-12 DIAGNOSIS — Z794 Long term (current) use of insulin: Secondary | ICD-10-CM

## 2020-01-12 LAB — BAYER DCA HB A1C WAIVED: HB A1C (BAYER DCA - WAIVED): 7.1 % — ABNORMAL HIGH (ref <7.0)

## 2020-01-12 MED ORDER — ACCU-CHEK AVIVA PLUS VI STRP: 1.0000 | ORAL_STRIP | Freq: Two times a day (BID) | 11 refills | Status: DC

## 2020-01-12 NOTE — Progress Notes (Signed)
BP 129/81   Pulse 66   Temp 97.9 F (36.6 C)   Ht 6' 4.5" (1.943 m)   Wt 289 lb (131.1 kg)   SpO2 98%   BMI 34.72 kg/m    Subjective:   Patient ID: Evan [name], male    DOB: [birth year], 54 y.o.   MRN: [id number]  HPI: Evan [name] is a 54 y.o. male presenting on 01/12/2020 for Medical Management of Chronic Issues and Diabetes   HPI Patient comes in complaining of pain in his back and pain with inspiration and pain with movement in his upper back and chest wall that is very much like the pain he had when he had previous PEs per him.  He is not complaining of any shortness of breath today but does get a catch when he breathes deeply.  Type 2 diabetes mellitus Patient comes in today for recheck of his diabetes. Patient has been currently taking Jardiance and Levemir. Patient is currently on an ACE inhibitor/ARB. Patient has not seen an ophthalmologist this year. Patient denies any issues with their feet. The symptom started onset as an adult hypertension and hyperlipidemia ARE RELATED TO DM   Hypertension Patient is currently on losartan and spironolactone and carvedilol and furosemide, and their blood pressure today is 129/81. Patient denies any lightheadedness or dizziness. Patient denies headaches, blurred vision, chest pains, shortness of breath, or weakness. Denies any side effects from medication and is content with current medication.   Hyperlipidemia Patient is coming in for recheck of his hyperlipidemia. The patient is currently taking fenofibrate and atorvastatin and fish oil. They deny any issues with myalgias or history of liver damage from it. They deny any focal numbness or weakness or chest pain.   Patient is coming in complaining of posterior chest wall pain on the right side, December scapula, it is associated with worsening with deep inspiration and rotation of his upper body.  He says this is the exact location where he had pain when he had a previous pulmonary  embolism and is concerned about that.  He denies any shortness of breath but does feel like the pain catches him when he tries to breathe deeply.  Relevant past medical, surgical, family and social history reviewed and updated as indicated. Interim medical history since our last visit reviewed. Allergies and medications reviewed and updated.  Review of Systems  Constitutional: Negative for chills and fever.  Eyes: Negative for visual disturbance.  Respiratory: Negative for shortness of breath and wheezing.   Cardiovascular: Positive for chest pain. Negative for leg swelling.  Musculoskeletal: Positive for back pain. Negative for gait problem.  Skin: Negative for rash.  All other systems reviewed and are negative.   Per HPI unless specifically indicated above   Allergies as of 01/12/2020   No Known Allergies     Medication List       Accurate as of January 12, 2020 11:27 AM. If you have any questions, ask your nurse or doctor.        STOP taking these medications   HYDROcodone-acetaminophen 7.5-325 MG tablet Commonly known as: NORCO Stopped by: Fransisca Kaufmann Karell Tukes, MD     TAKE these medications   Accu-Chek Aviva Plus test strip Generic drug: glucose blood USE TO CHECK SUGAR TWICE DAILY   aspirin EC 81 MG tablet Take 81 mg by mouth daily.   atorvastatin 80 MG tablet Commonly known as: LIPITOR Take 1 tablet (80 mg total) by mouth daily at 6 PM.  BD Pen Needle Nano U/F 32G X 4 MM Misc Generic drug: Insulin Pen Needle USE DAILY WITH INSULIN   Brilinta 90 MG Tabs tablet Generic drug: ticagrelor TAKE 1 TABLET (90 MG TOTAL) BY MOUTH 2 (TWO) TIMES DAILY.   carvedilol 12.5 MG tablet Commonly known as: COREG Take 1 tablet (12.5 mg total) by mouth 2 (two) times daily with a meal.   famotidine 20 MG tablet Commonly known as: Pepcid Take 1 tablet (20 mg total) by mouth 2 (two) times daily.   fenofibrate 145 MG tablet Commonly known as: TRICOR Take 1 tablet (145 mg  total) by mouth daily.   Fish Oil 1000 MG Caps Take 2,000 mg by mouth daily.   fluticasone 50 MCG/ACT nasal spray Commonly known as: FLONASE PLACE 1 SPRAY INTO BOTH NOSTRILS DAILY.   furosemide 20 MG tablet Commonly known as: LASIX Take 1 tablet (20 mg total) by mouth daily.   insulin detemir 100 UNIT/ML FlexPen Commonly known as: Levemir FlexTouch INJECT 30 UNITS DAILY AT BEDTIME - UP TO 50 UNITS AS NEEDED   Jardiance 25 MG Tabs tablet Generic drug: empagliflozin Take 25 mg by mouth daily.   losartan 25 MG tablet Commonly known as: COZAAR Take 1 tablet (25 mg total) by mouth at bedtime.   nitroGLYCERIN 0.4 MG SL tablet Commonly known as: NITROSTAT Place 1 tablet (0.4 mg total) under the tongue every 5 (five) minutes x 3 doses as needed for chest pain.   potassium chloride 10 MEQ tablet Commonly known as: KLOR-CON TAKE 1 TABLET BY MOUTH EVERY DAY   spironolactone 25 MG tablet Commonly known as: ALDACTONE TAKE 1/2 TABLET BY MOUTH EVERY DAY        Objective:   BP 129/81   Pulse 66   Temp 97.9 F (36.6 C)   Ht 6' 4.5" (1.943 m)   Wt 289 lb (131.1 kg)   SpO2 98%   BMI 34.72 kg/m   Wt Readings from Last 3 Encounters:  01/12/20 289 lb (131.1 kg)  10/12/19 278 lb 2 oz (126.2 kg)  09/23/19 277 lb (125.6 kg)    Physical Exam Vitals and nursing note reviewed.  Constitutional:      General: He is not in acute distress.    Appearance: He is well-developed. He is not diaphoretic.  Eyes:     General: No scleral icterus.    Conjunctiva/sclera: Conjunctivae normal.  Neck:     Thyroid: No thyromegaly.  Cardiovascular:     Rate and Rhythm: Normal rate and regular rhythm.     Heart sounds: Normal heart sounds. No murmur heard.   Pulmonary:     Effort: Pulmonary effort is normal. No respiratory distress.     Breath sounds: Normal breath sounds. No wheezing.  Musculoskeletal:        General: Normal range of motion.     Cervical back: Neck supple.    Lymphadenopathy:     Cervical: No cervical adenopathy.  Skin:    General: Skin is warm and dry.     Findings: No rash.  Neurological:     Mental Status: He is alert and oriented to person, place, and time.     Coordination: Coordination normal.  Psychiatric:        Behavior: Behavior normal.       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) - Primary   Relevant Medications   glucose blood (ACCU-CHEK AVIVA PLUS) test strip   Other Relevant Orders   Bayer DCA Hb A1c Waived (Completed)   CMP14+EGFR (Completed)   CBC with Differential/Platelet (Completed)   Lipid panel (Completed)   TSH (Completed)   Hyperlipidemia associated with type 2 diabetes mellitus ([place])   Relevant Orders   Lipid panel (Completed)    Other Visit Diagnoses    Other chest pain       Relevant Orders   CT ANGIO CHEST PE W OR WO CONTRAST (Completed)   History of pulmonary embolus (PE)       Type 2 diabetes mellitus without complication, with long-term current use of insulin (HCC)       Relevant Medications   glucose blood (ACCU-CHEK AVIVA PLUS) test strip      Patient has a history of PE and having pain that he says very similar to when he had his PE.  Will order CT for PE. Follow up plan: Return in about 3 months (around 04/13/2020), or if symptoms worsen or fail to improve, for Diabetes follow-up.  Counseling provided for all of the vaccine components Orders Placed This Encounter  Procedures  . CT ANGIO CHEST PE W OR WO CONTRAST  . Bayer DCA Hb A1c Waived  . CMP14+EGFR  . CBC with Differential/Platelet  . Lipid panel  . TSH    Caryl Pina, MD [facility] 01/12/2020, 11:27 AM

## 2020-01-13 ENCOUNTER — Telehealth: Payer: Self-pay | Admitting: Family Medicine

## 2020-01-13 LAB — CMP14+EGFR
ALT: 21 IU/L (ref 0–44)
AST: 19 IU/L (ref 0–40)
Albumin/Globulin Ratio: 1.5 (ref 1.2–2.2)
Albumin: 4.5 g/dL (ref 3.8–4.9)
Alkaline Phosphatase: 62 IU/L (ref 48–121)
BUN/Creatinine Ratio: 11 (ref 9–20)
BUN: 15 mg/dL (ref 6–24)
Bilirubin Total: 0.3 mg/dL (ref 0.0–1.2)
CO2: 26 mmol/L (ref 20–29)
Calcium: 9.6 mg/dL (ref 8.7–10.2)
Chloride: 100 mmol/L (ref 96–106)
Creatinine, Ser: 1.31 mg/dL — ABNORMAL HIGH (ref 0.76–1.27)
GFR calc Af Amer: 71 mL/min/{1.73_m2} (ref >59)
GFR calc non Af Amer: 61 mL/min/{1.73_m2} (ref >59)
Globulin, Total: 3.1 g/dL (ref 1.5–4.5)
Glucose: 202 mg/dL — ABNORMAL HIGH (ref 65–99)
Potassium: 4.3 mmol/L (ref 3.5–5.2)
Sodium: 140 mmol/L (ref 134–144)
Total Protein: 7.6 g/dL (ref 6.0–8.5)

## 2020-01-13 LAB — CBC WITH DIFFERENTIAL/PLATELET
Basophils Absolute: 0.1 10*3/uL (ref 0.0–0.2)
Basos: 1 %
EOS (ABSOLUTE): 0.4 10*3/uL (ref 0.0–0.4)
Eos: 4 %
Hematocrit: 47.2 % (ref 37.5–51.0)
Hemoglobin: 15.9 g/dL (ref 13.0–17.7)
Immature Grans (Abs): 0 10*3/uL (ref 0.0–0.1)
Immature Granulocytes: 0 %
Lymphocytes Absolute: 1.8 10*3/uL (ref 0.7–3.1)
Lymphs: 19 %
MCH: 31.6 pg (ref 26.6–33.0)
MCHC: 33.7 g/dL (ref 31.5–35.7)
MCV: 94 fL (ref 79–97)
Monocytes Absolute: 1 10*3/uL — ABNORMAL HIGH (ref 0.1–0.9)
Monocytes: 10 %
Neutrophils Absolute: 6.2 10*3/uL (ref 1.4–7.0)
Neutrophils: 66 %
Platelets: 211 10*3/uL (ref 150–450)
RBC: 5.03 x10E6/uL (ref 4.14–5.80)
RDW: 12.6 % (ref 11.6–15.4)
WBC: 9.4 10*3/uL (ref 3.4–10.8)

## 2020-01-13 LAB — LIPID PANEL
Chol/HDL Ratio: 4.1 ratio (ref 0.0–5.0)
Cholesterol, Total: 154 mg/dL (ref 100–199)
HDL: 38 mg/dL — ABNORMAL LOW (ref >39)
LDL Chol Calc (NIH): 89 mg/dL (ref 0–99)
Triglycerides: 153 mg/dL — ABNORMAL HIGH (ref 0–149)
VLDL Cholesterol Cal: 27 mg/dL (ref 5–40)

## 2020-01-13 LAB — TSH: TSH: 1.85 u[IU]/mL (ref 0.450–4.500)

## 2020-01-16 ENCOUNTER — Ambulatory Visit (HOSPITAL_COMMUNITY): Admission: RE | Admit: 2020-01-16 | Payer: Medicaid Other | Source: Ambulatory Visit

## 2020-01-17 ENCOUNTER — Other Ambulatory Visit: Payer: Self-pay

## 2020-01-17 ENCOUNTER — Ambulatory Visit (HOSPITAL_COMMUNITY)
Admission: RE | Admit: 2020-01-17 | Discharge: 2020-01-17 | Disposition: A | Discharge status: Home / Self Care | Payer: Medicaid Other | Source: Ambulatory Visit | Attending: Family Medicine | Admitting: Family Medicine

## 2020-01-17 DIAGNOSIS — R0602 Shortness of breath: Secondary | ICD-10-CM | POA: Diagnosis not present

## 2020-01-17 DIAGNOSIS — R0789 Other chest pain: Secondary | ICD-10-CM | POA: Insufficient documentation

## 2020-01-17 MED ORDER — IOHEXOL 350 MG/ML SOLN
100.0000 mL | Freq: Once | INTRAVENOUS | Status: AC | PRN
  Administered 2020-01-17: 100 mL via INTRAVENOUS

## 2020-02-06 ENCOUNTER — Other Ambulatory Visit: Payer: Self-pay | Admitting: Family Medicine

## 2020-03-08 ENCOUNTER — Other Ambulatory Visit: Payer: Self-pay | Admitting: Family Medicine

## 2020-03-08 DIAGNOSIS — E1165 Type 2 diabetes mellitus with hyperglycemia: Secondary | ICD-10-CM

## 2020-03-08 DIAGNOSIS — IMO0002 Reserved for concepts with insufficient information to code with codable children: Secondary | ICD-10-CM

## 2020-03-13 ENCOUNTER — Other Ambulatory Visit: Payer: Self-pay | Admitting: Family Medicine

## 2020-03-17 ENCOUNTER — Other Ambulatory Visit: Payer: Self-pay | Admitting: Family Medicine

## 2020-03-17 DIAGNOSIS — IMO0002 Reserved for concepts with insufficient information to code with codable children: Secondary | ICD-10-CM

## 2020-03-21 ENCOUNTER — Ambulatory Visit (INDEPENDENT_AMBULATORY_CARE_PROVIDER_SITE_OTHER): Payer: Medicaid Other | Admitting: Family Medicine

## 2020-03-21 ENCOUNTER — Encounter: Payer: Self-pay | Admitting: Family Medicine

## 2020-03-21 ENCOUNTER — Other Ambulatory Visit: Payer: Self-pay

## 2020-03-21 DIAGNOSIS — H66009 Acute suppurative otitis media without spontaneous rupture of ear drum, unspecified ear: Secondary | ICD-10-CM | POA: Diagnosis not present

## 2020-03-21 MED ORDER — AMOXICILLIN-POT CLAVULANATE 875-125 MG PO TABS: 1.0000 | ORAL_TABLET | Freq: Two times a day (BID) | ORAL | 0 refills | Status: DC

## 2020-03-21 NOTE — Progress Notes (Signed)
    Subjective:    Patient ID: Evan [name], male    DOB: 1966/01/04, 54 y.o.   MRN: [id number]   HPI: YAHYA BOLDMAN is a 54 y.o. male presenting for pain in shoulders, neck and up to ear. Ears feel clogged up. Several days. Hearing is poor anyway. Pulling on the ear helps open them up a little. Pain worse at right ear. No fever, sore throat or congestion.    Depression screen [facility] 2/9 01/12/2020 10/12/2019 07/14/2019 04/13/2019 01/04/2019  Decreased Interest 0 0 0 0 0  Down, Depressed, Hopeless 0 0 0 0 0  PHQ - 2 Score 0 0 0 0 0     Relevant past medical, surgical, family and social history reviewed and updated as indicated.  Interim medical history since our last visit reviewed. Allergies and medications reviewed and updated.  ROS:  Review of Systems  Constitutional: Positive for appetite change (decreased a little). Negative for activity change and fever.  HENT: Positive for ear pain. Negative for congestion and sore throat.   Respiratory: Negative for cough and shortness of breath.   Cardiovascular: Negative for chest pain.  Gastrointestinal: Positive for nausea. Negative for diarrhea and vomiting.     Social History   Tobacco Use  Smoking Status Never Smoker  Smokeless Tobacco Never Used       Objective:     Wt Readings from Last 3 Encounters:  01/12/20 289 lb (131.1 kg)  10/12/19 278 lb 2 oz (126.2 kg)  09/23/19 277 lb (125.6 kg)     Exam deferred. Pt. Harboring due to COVID 19. Phone visit performed.   Assessment & Plan:   1. Acute suppurative otitis media without spontaneous rupture of ear drum, recurrence not specified, unspecified laterality     Meds ordered this encounter  Medications  . amoxicillin-clavulanate (AUGMENTIN) 875-125 MG tablet    Sig: Take 1 tablet by mouth 2 (two) times daily. Take all of this medication    Dispense:  20 tablet    Refill:  0    No orders of the defined types were placed in this encounter.     Diagnoses and all  orders for this visit:  Acute suppurative otitis media without spontaneous rupture of ear drum, recurrence not specified, unspecified laterality  Other orders -     amoxicillin-clavulanate (AUGMENTIN) 875-125 MG tablet; Take 1 tablet by mouth 2 (two) times daily. Take all of this medication    Virtual Visit via telephone Note  I discussed the limitations, risks, security and privacy concerns of performing an evaluation and management service by telephone and the availability of in person appointments. The patient was identified with two identifiers. Pt.expressed understanding and agreed to proceed. Pt. Is at home. Dr. Livia Snellen is in his office.  Follow Up Instructions:   I discussed the assessment and treatment plan with the patient. The patient was provided an opportunity to ask questions and all were answered. The patient agreed with the plan and demonstrated an understanding of the instructions.   The patient was advised to call back or seek an in-person evaluation if the symptoms worsen or if the condition fails to improve as anticipated.   Total minutes including chart review and phone contact time: 14   Follow up plan: No follow-ups on file.  Claretta Fraise, MD [place]

## 2020-04-08 ENCOUNTER — Other Ambulatory Visit: Payer: Self-pay | Admitting: Family Medicine

## 2020-04-16 ENCOUNTER — Ambulatory Visit: Payer: Medicaid Other | Admitting: Family Medicine

## 2020-04-17 ENCOUNTER — Encounter: Payer: Self-pay | Admitting: Family Medicine

## 2020-04-24 ENCOUNTER — Telehealth: Payer: Self-pay

## 2020-04-24 MED ORDER — FENOFIBRATE 145 MG PO TABS
145.0000 mg | ORAL_TABLET | Freq: Every day | ORAL | 0 refills | Status: DC
Start: 2020-04-24 — End: 2020-05-24

## 2020-04-24 NOTE — Telephone Encounter (Signed)
  Prescription Request  04/24/2020  What is the name of the medication or equipment? Fenofibrate  Have you contacted your pharmacy to request a refill? (if applicable) no  Which pharmacy would you like this sent to? [facility]   Patient notified that their request is being sent to the clinical staff for review and that they should receive a response within 2 business days.   Dettinger's pt.  Please call pt.  He has been waiting 2 weeks for.  He tried several times to call Pharmacy, but they put him on hold each time.

## 2020-04-24 NOTE — Telephone Encounter (Signed)
Refill sent to pharmacy.   

## 2020-05-03 ENCOUNTER — Other Ambulatory Visit: Payer: Self-pay | Admitting: Family Medicine

## 2020-05-03 DIAGNOSIS — IMO0002 Reserved for concepts with insufficient information to code with codable children: Secondary | ICD-10-CM

## 2020-05-14 ENCOUNTER — Other Ambulatory Visit: Payer: Self-pay | Admitting: Family Medicine

## 2020-05-17 ENCOUNTER — Other Ambulatory Visit: Payer: Self-pay | Admitting: Family Medicine

## 2020-05-22 ENCOUNTER — Other Ambulatory Visit: Payer: Self-pay | Admitting: Family Medicine

## 2020-05-24 ENCOUNTER — Encounter: Payer: Self-pay | Admitting: Family Medicine

## 2020-05-24 ENCOUNTER — Ambulatory Visit: Payer: Medicaid Other | Admitting: Family Medicine

## 2020-05-24 ENCOUNTER — Other Ambulatory Visit: Payer: Self-pay | Admitting: Family Medicine

## 2020-05-24 ENCOUNTER — Other Ambulatory Visit: Payer: Self-pay

## 2020-05-24 VITALS — BP 116/82 | HR 104 | Ht 76.5 in | Wt 291.0 lb

## 2020-05-24 DIAGNOSIS — L84 Corns and callosities: Secondary | ICD-10-CM

## 2020-05-24 DIAGNOSIS — N529 Male erectile dysfunction, unspecified: Secondary | ICD-10-CM | POA: Diagnosis not present

## 2020-05-24 DIAGNOSIS — Z23 Encounter for immunization: Secondary | ICD-10-CM | POA: Diagnosis not present

## 2020-05-24 DIAGNOSIS — Z794 Long term (current) use of insulin: Secondary | ICD-10-CM

## 2020-05-24 DIAGNOSIS — E11610 Type 2 diabetes mellitus with diabetic neuropathic arthropathy: Secondary | ICD-10-CM | POA: Diagnosis not present

## 2020-05-24 DIAGNOSIS — E1165 Type 2 diabetes mellitus with hyperglycemia: Secondary | ICD-10-CM | POA: Diagnosis not present

## 2020-05-24 DIAGNOSIS — Z1211 Encounter for screening for malignant neoplasm of colon: Secondary | ICD-10-CM | POA: Diagnosis not present

## 2020-05-24 DIAGNOSIS — I152 Hypertension secondary to endocrine disorders: Secondary | ICD-10-CM | POA: Diagnosis not present

## 2020-05-24 DIAGNOSIS — E1169 Type 2 diabetes mellitus with other specified complication: Secondary | ICD-10-CM

## 2020-05-24 DIAGNOSIS — E1159 Type 2 diabetes mellitus with other circulatory complications: Secondary | ICD-10-CM | POA: Diagnosis not present

## 2020-05-24 DIAGNOSIS — E785 Hyperlipidemia, unspecified: Secondary | ICD-10-CM | POA: Diagnosis not present

## 2020-05-24 DIAGNOSIS — IMO0002 Reserved for concepts with insufficient information to code with codable children: Secondary | ICD-10-CM

## 2020-05-24 DIAGNOSIS — E11628 Type 2 diabetes mellitus with other skin complications: Secondary | ICD-10-CM | POA: Diagnosis not present

## 2020-05-24 LAB — BAYER DCA HB A1C WAIVED: HB A1C (BAYER DCA - WAIVED): 7.9 % — ABNORMAL HIGH (ref <7.0)

## 2020-05-24 MED ORDER — ACCU-CHEK AVIVA CONNECT W/DEVICE KIT: 1.0000 | PACK | Freq: Every day | 0 refills | Status: DC

## 2020-05-24 MED ORDER — JARDIANCE 25 MG PO TABS: 25.0000 mg | ORAL_TABLET | Freq: Every day | ORAL | 11 refills | Status: DC

## 2020-05-24 MED ORDER — OZEMPIC (0.25 OR 0.5 MG/DOSE) 2 MG/1.5ML ~~LOC~~ SOPN: 0.5000 mg | PEN_INJECTOR | SUBCUTANEOUS | 3 refills | Status: DC

## 2020-05-24 MED ORDER — CARVEDILOL 12.5 MG PO TABS: 12.5000 mg | ORAL_TABLET | Freq: Two times a day (BID) | ORAL | 3 refills | Status: DC

## 2020-05-24 MED ORDER — ATORVASTATIN CALCIUM 80 MG PO TABS: 80.0000 mg | ORAL_TABLET | Freq: Every day | ORAL | 3 refills | Status: DC

## 2020-05-24 MED ORDER — LEVEMIR FLEXTOUCH 100 UNIT/ML ~~LOC~~ SOPN: 30.0000 [IU] | PEN_INJECTOR | Freq: Every day | SUBCUTANEOUS | 3 refills | Status: DC

## 2020-05-24 MED ORDER — FUROSEMIDE 20 MG PO TABS: 20.0000 mg | ORAL_TABLET | Freq: Every day | ORAL | 3 refills | Status: DC

## 2020-05-24 MED ORDER — FENOFIBRATE 145 MG PO TABS: 145.0000 mg | ORAL_TABLET | Freq: Every day | ORAL | 3 refills | Status: DC

## 2020-05-24 MED ORDER — FAMOTIDINE 20 MG PO TABS: 20.0000 mg | ORAL_TABLET | Freq: Two times a day (BID) | ORAL | 3 refills | Status: DC

## 2020-05-24 MED ORDER — LOSARTAN POTASSIUM 25 MG PO TABS: 25.0000 mg | ORAL_TABLET | Freq: Every day | ORAL | 3 refills | Status: DC

## 2020-05-24 NOTE — Progress Notes (Signed)
BP 116/82   Pulse (!) 104   Ht 6' 4.5" (1.943 m)   Wt 291 lb (132 kg)   SpO2 97%   BMI 34.96 kg/m    Subjective:   Patient ID: Evan [name], male    DOB: 21-Apr-1966, 55 y.o.   MRN: [id number]  HPI: Evan [name] is a 55 y.o. male presenting on 05/24/2020 for Medical Management of Chronic Issues, Diabetes, Hyperlipidemia, and Hypertension   HPI Type 2 diabetes mellitus Patient comes in today for recheck of his diabetes. Patient has been currently taking Jardiance and Levemir 30. Patient is currently on an ACE inhibitor/ARB. Patient has seen an ophthalmologist this year. Patient denies any issues with their feet. The symptom started onset as an adult hypertension and hyperlipidemia ARE RELATED TO DM   Hypertension Patient is currently on spironolactone and carvedilol and furosemide and losartan, and their blood pressure today is 116/82. Patient denies any lightheadedness or dizziness. Patient denies headaches, blurred vision, chest pains, shortness of breath, or weakness. Denies any side effects from medication and is content with current medication.   Hyperlipidemia Patient is coming in for recheck of his hyperlipidemia. The patient is currently taking atorvastatin and fenofibrate. They deny any issues with myalgias or history of liver damage from it. They deny any focal numbness or weakness or chest pain.   Relevant past medical, surgical, family and social history reviewed and updated as indicated. Interim medical history since our last visit reviewed. Allergies and medications reviewed and updated.  Review of Systems  Constitutional: Negative for chills and fever.  Eyes: Negative for visual disturbance.  Respiratory: Negative for shortness of breath and wheezing.   Cardiovascular: Negative for chest pain and leg swelling.  Musculoskeletal: Negative for back pain and gait problem.  Skin: Negative for rash.  Neurological: Negative for dizziness, weakness and light-headedness.   All other systems reviewed and are negative.   Per HPI unless specifically indicated above   Allergies as of 05/24/2020   No Known Allergies     Medication List       Accurate as of May 24, 2020  2:47 PM. If you have any questions, ask your nurse or doctor.        Accu-Chek Aviva Plus test strip Generic drug: glucose blood 1 each by Other route in the morning and at bedtime. Use as instructed   amoxicillin-clavulanate 875-125 MG tablet Commonly known as: AUGMENTIN Take 1 tablet by mouth 2 (two) times daily. Take all of this medication   aspirin EC 81 MG tablet Take 81 mg by mouth daily.   atorvastatin 80 MG tablet Commonly known as: LIPITOR Take 1 tablet (80 mg total) by mouth daily at 6 PM.   BD Pen Needle Nano 2nd Gen 32G X 4 MM Misc Generic drug: Insulin Pen Needle Use daily with insulin Dx E11.9, Z79.4   Brilinta 90 MG Tabs tablet Generic drug: ticagrelor TAKE 1 TABLET (90 MG TOTAL) BY MOUTH 2 (TWO) TIMES DAILY.   carvedilol 12.5 MG tablet Commonly known as: COREG Take 1 tablet (12.5 mg total) by mouth 2 (two) times daily with a meal.   famotidine 20 MG tablet Commonly known as: Pepcid Take 1 tablet (20 mg total) by mouth 2 (two) times daily.   fenofibrate 145 MG tablet Commonly known as: TRICOR Take 1 tablet (145 mg total) by mouth daily.   Fish Oil 1000 MG Caps Take 2,000 mg by mouth daily.   fluticasone 50 MCG/ACT nasal spray Commonly  known as: FLONASE PLACE 1 SPRAY INTO BOTH NOSTRILS DAILY.   furosemide 20 MG tablet Commonly known as: LASIX Take 1 tablet (20 mg total) by mouth daily.   Jardiance 25 MG Tabs tablet Generic drug: empagliflozin Take 1 tablet (25 mg total) by mouth daily. What changed: how much to take Changed by: Worthy Rancher, MD   Levemir FlexTouch 100 UNIT/ML FlexPen Generic drug: insulin detemir Inject 30 Units into the skin at bedtime. What changed: additional instructions Changed by: Fransisca Kaufmann Dettinger, MD    losartan 25 MG tablet Commonly known as: COZAAR Take 1 tablet (25 mg total) by mouth at bedtime. What changed: additional instructions Changed by: Fransisca Kaufmann Dettinger, MD   nitroGLYCERIN 0.4 MG SL tablet Commonly known as: NITROSTAT Place 1 tablet (0.4 mg total) under the tongue every 5 (five) minutes x 3 doses as needed for chest pain.   Ozempic (0.25 or 0.5 MG/DOSE) 2 MG/1.5ML Sopn Generic drug: Semaglutide(0.25 or 0.5MG/DOS) Inject 0.5 mg into the skin once a week. Started by: Fransisca Kaufmann Dettinger, MD   potassium chloride 10 MEQ tablet Commonly known as: KLOR-CON TAKE 1 TABLET BY MOUTH EVERY DAY   spironolactone 25 MG tablet Commonly known as: ALDACTONE TAKE 1/2 TABLET BY MOUTH EVERY DAY        Objective:   BP 116/82   Pulse (!) 104   Ht 6' 4.5" (1.943 m)   Wt 291 lb (132 kg)   SpO2 97%   BMI 34.96 kg/m   Wt Readings from Last 3 Encounters:  05/24/20 291 lb (132 kg)  01/12/20 289 lb (131.1 kg)  10/12/19 278 lb 2 oz (126.2 kg)    Physical Exam Vitals and nursing note reviewed.  Constitutional:      General: He is not in acute distress.    Appearance: He is well-developed and well-nourished. He is not diaphoretic.  Eyes:     General: No scleral icterus.    Extraocular Movements: EOM normal.     Conjunctiva/sclera: Conjunctivae normal.  Neck:     Thyroid: No thyromegaly.  Cardiovascular:     Rate and Rhythm: Normal rate and regular rhythm.     Pulses: Intact distal pulses.     Heart sounds: Normal heart sounds. No murmur heard.   Pulmonary:     Effort: Pulmonary effort is normal. No respiratory distress.     Breath sounds: Normal breath sounds. No wheezing.  Musculoskeletal:        General: No edema. Normal range of motion.     Cervical back: Neck supple.  Lymphadenopathy:     Cervical: No cervical adenopathy.  Skin:    General: Skin is warm and dry.     Findings: No rash.  Neurological:     Mental Status: He is alert and oriented to person, place,  and time.     Coordination: Coordination normal.  Psychiatric:        Mood and Affect: Mood and affect normal.        Behavior: Behavior normal.       Assessment & Plan:   Problem List Items Addressed This Visit      Cardiovascular and Mediastinum   Hypertension associated with diabetes ([place])   Relevant Medications   Semaglutide,0.25 or 0.5MG/DOS, (OZEMPIC, 0.25 OR 0.5 MG/DOSE,) 2 MG/1.5ML SOPN   atorvastatin (LIPITOR) 80 MG tablet   carvedilol (COREG) 12.5 MG tablet   fenofibrate (TRICOR) 145 MG tablet   furosemide (LASIX) 20 MG tablet   insulin detemir (  LEVEMIR FLEXTOUCH) 100 UNIT/ML FlexPen   JARDIANCE 25 MG TABS tablet   losartan (COZAAR) 25 MG tablet   Other Relevant Orders   CBC with Differential/Platelet   CMP14+EGFR   Lipid panel   Bayer DCA Hb A1c Waived     Endocrine   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) - Primary   Relevant Medications   Semaglutide,0.25 or 0.5MG/DOS, (OZEMPIC, 0.25 OR 0.5 MG/DOSE,) 2 MG/1.5ML SOPN   atorvastatin (LIPITOR) 80 MG tablet   insulin detemir (LEVEMIR FLEXTOUCH) 100 UNIT/ML FlexPen   JARDIANCE 25 MG TABS tablet   losartan (COZAAR) 25 MG tablet   Other Relevant Orders   CBC with Differential/Platelet   CMP14+EGFR   Lipid panel   Bayer DCA Hb A1c Waived   Bayer DCA Hb A1c Waived (Completed)   Hyperlipidemia associated with type 2 diabetes mellitus (HCC)   Relevant Medications   Semaglutide,0.25 or 0.5MG/DOS, (OZEMPIC, 0.25 OR 0.5 MG/DOSE,) 2 MG/1.5ML SOPN   atorvastatin (LIPITOR) 80 MG tablet   fenofibrate (TRICOR) 145 MG tablet   insulin detemir (LEVEMIR FLEXTOUCH) 100 UNIT/ML FlexPen   JARDIANCE 25 MG TABS tablet   losartan (COZAAR) 25 MG tablet   Other Relevant Orders   CBC with Differential/Platelet   CMP14+EGFR   Lipid panel   Bayer DCA Hb A1c Waived    Other Visit Diagnoses    Colon cancer screening       Relevant Orders   Ambulatory referral to Gastroenterology   Need for immunization against  influenza       Relevant Orders   Flu Vaccine QUAD 36+ mos IM (Completed)   Erectile dysfunction, unspecified erectile dysfunction type       Relevant Orders   Ambulatory referral to Urology   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  Follow up plan: Return in about 3 months (around 08/22/2020), or if symptoms worsen or fail to improve, for Diabetes.  Counseling provided for all of the vaccine components Orders Placed This Encounter  Procedures  . Flu Vaccine QUAD 36+ mos IM  . CBC with Differential/Platelet  . CMP14+EGFR  . Lipid panel  . Bayer DCA Hb A1c Waived  . Bayer DCA Hb A1c Waived  . Ambulatory referral to Gastroenterology  . Ambulatory referral to Urology    Caryl Pina, MD [facility] 05/24/2020, 2:47 PM

## 2020-05-25 LAB — CMP14+EGFR
ALT: 20 IU/L (ref 0–44)
AST: 17 IU/L (ref 0–40)
Albumin/Globulin Ratio: 1.6 (ref 1.2–2.2)
Albumin: 4.5 g/dL (ref 3.8–4.9)
Alkaline Phosphatase: 81 IU/L (ref 44–121)
BUN/Creatinine Ratio: 13 (ref 9–20)
BUN: 14 mg/dL (ref 6–24)
Bilirubin Total: 0.3 mg/dL (ref 0.0–1.2)
CO2: 25 mmol/L (ref 20–29)
Calcium: 9.7 mg/dL (ref 8.7–10.2)
Chloride: 99 mmol/L (ref 96–106)
Creatinine, Ser: 1.11 mg/dL (ref 0.76–1.27)
GFR calc Af Amer: 87 mL/min/{1.73_m2} (ref >59)
GFR calc non Af Amer: 75 mL/min/{1.73_m2} (ref >59)
Globulin, Total: 2.9 g/dL (ref 1.5–4.5)
Glucose: 289 mg/dL — ABNORMAL HIGH (ref 65–99)
Potassium: 4.3 mmol/L (ref 3.5–5.2)
Sodium: 139 mmol/L (ref 134–144)
Total Protein: 7.4 g/dL (ref 6.0–8.5)

## 2020-05-25 LAB — CBC WITH DIFFERENTIAL/PLATELET
Basophils Absolute: 0.1 10*3/uL (ref 0.0–0.2)
Basos: 1 %
EOS (ABSOLUTE): 0.3 10*3/uL (ref 0.0–0.4)
Eos: 2 %
Hematocrit: 49.8 % (ref 37.5–51.0)
Hemoglobin: 17 g/dL (ref 13.0–17.7)
Immature Grans (Abs): 0 10*3/uL (ref 0.0–0.1)
Immature Granulocytes: 0 %
Lymphocytes Absolute: 2 10*3/uL (ref 0.7–3.1)
Lymphs: 17 %
MCH: 31.9 pg (ref 26.6–33.0)
MCHC: 34.1 g/dL (ref 31.5–35.7)
MCV: 93 fL (ref 79–97)
Monocytes Absolute: 0.8 10*3/uL (ref 0.1–0.9)
Monocytes: 6 %
Neutrophils Absolute: 8.9 10*3/uL — ABNORMAL HIGH (ref 1.4–7.0)
Neutrophils: 74 %
Platelets: 249 10*3/uL (ref 150–450)
RBC: 5.33 x10E6/uL (ref 4.14–5.80)
RDW: 12.1 % (ref 11.6–15.4)
WBC: 12.1 10*3/uL — ABNORMAL HIGH (ref 3.4–10.8)

## 2020-05-25 LAB — LIPID PANEL
Chol/HDL Ratio: 5.1 ratio — ABNORMAL HIGH (ref 0.0–5.0)
Cholesterol, Total: 162 mg/dL (ref 100–199)
HDL: 32 mg/dL — ABNORMAL LOW (ref >39)
LDL Chol Calc (NIH): 59 mg/dL (ref 0–99)
Triglycerides: 464 mg/dL — ABNORMAL HIGH (ref 0–149)
VLDL Cholesterol Cal: 71 mg/dL — ABNORMAL HIGH (ref 5–40)

## 2020-05-28 ENCOUNTER — Telehealth: Payer: Self-pay | Admitting: *Deleted

## 2020-05-28 DIAGNOSIS — E11610 Type 2 diabetes mellitus with diabetic neuropathic arthropathy: Secondary | ICD-10-CM

## 2020-05-28 DIAGNOSIS — IMO0002 Reserved for concepts with insufficient information to code with codable children: Secondary | ICD-10-CM

## 2020-05-28 NOTE — Telephone Encounter (Signed)
Insurance will not cover ozempic. Patient must fail trulicity, victoa, or bydureon.

## 2020-05-30 MED ORDER — TRULICITY 1.5 MG/0.5ML ~~LOC~~ SOAJ: 1.5000 mg | SUBCUTANEOUS | 3 refills | Status: DC

## 2020-05-30 NOTE — Telephone Encounter (Signed)
Patient aware and verbalizes understanding. 

## 2020-05-30 NOTE — Telephone Encounter (Signed)
Patient tried and failed both Bydureon and Trulicity in [id number] according to my records.  Then we put him on Ozempic.

## 2020-05-30 NOTE — Telephone Encounter (Signed)
Please let the patient know that I sent Trulicity because the Ozempic does not want to be covered on his plan but the Trulicity will come and still just once a week.

## 2020-05-30 NOTE — Telephone Encounter (Signed)
Patient never tried trulicity his insurance would not cover

## 2020-06-04 NOTE — Telephone Encounter (Signed)
Trulicity PA Case: [id number], Status: Approved, Coverage Starts on: 06/04/2020 12:00:00 AM, Coverage Ends on: 06/04/2021 12:00:00 AM.  Pharmacy aware.

## 2020-06-04 NOTE — Telephone Encounter (Signed)
PA came in for the Trulicity Key [id number]  Sent to [employer]

## 2020-06-20 ENCOUNTER — Other Ambulatory Visit: Payer: Self-pay | Admitting: Cardiovascular Disease

## 2020-06-22 ENCOUNTER — Other Ambulatory Visit: Payer: Self-pay

## 2020-06-22 DIAGNOSIS — E119 Type 2 diabetes mellitus without complications: Secondary | ICD-10-CM

## 2020-06-22 DIAGNOSIS — Z794 Long term (current) use of insulin: Secondary | ICD-10-CM

## 2020-06-22 DIAGNOSIS — E1165 Type 2 diabetes mellitus with hyperglycemia: Secondary | ICD-10-CM

## 2020-06-22 DIAGNOSIS — IMO0002 Reserved for concepts with insufficient information to code with codable children: Secondary | ICD-10-CM

## 2020-06-22 MED ORDER — ACCU-CHEK AVIVA PLUS VI STRP: 1.0000 | ORAL_STRIP | Freq: Two times a day (BID) | 11 refills | Status: DC

## 2020-06-22 MED ORDER — BD PEN NEEDLE NANO 2ND GEN 32G X 4 MM MISC: 3 refills | Status: AC

## 2020-07-03 ENCOUNTER — Telehealth: Payer: Self-pay | Admitting: Cardiovascular Disease

## 2020-07-03 NOTE — Progress Notes (Signed)
Cardiology Clinic Note   Patient Name: Evan [name] Date of Encounter: 07/04/2020  Primary Care Provider:  Dettinger, Fransisca Kaufmann, MD Primary Cardiologist:  Quay Burow, MD  Patient Profile    Evan [name] 55 year old male presents the clinic today for an evaluation of his irregular heartbeat.  Past Medical History    Past Medical History:  Diagnosis Date  . Arthritis   . Diabetes mellitus without complication ([place])   . Gout   . Hyperlipidemia   . Hypertension   . Morbid obesity ([place]) 12/22/2016   Past Surgical History:  Procedure Laterality Date  . ANKLE ARTHROSCOPY Right   . CORONARY STENT INTERVENTION N/A 11/24/2018   Procedure: CORONARY STENT INTERVENTION;  Surgeon: Wellington Hampshire, MD;  Location: [facility];  Service: Cardiovascular;  Laterality: N/A;  prox and mid LAD  . CORONARY STENT INTERVENTION N/A 11/26/2018   Procedure: CORONARY STENT INTERVENTION;  Surgeon: Wellington Hampshire, MD;  Location: [facility];  Service: Cardiovascular;  Laterality: N/A;  . CYST REMOVAL HAND    . KNEE ARTHROSCOPY Left   . LEFT HEART CATH AND CORONARY ANGIOGRAPHY N/A 11/24/2018   Procedure: LEFT HEART CATH AND CORONARY ANGIOGRAPHY;  Surgeon: Wellington Hampshire, MD;  Location: [facility];  Service: Cardiovascular;  Laterality: N/A;    Allergies  No Known Allergies  History of Present Illness    Mr. Evan [name] past medical history includes hypertension, hyperlipidemia, coronary artery disease, obesity and ischemic cardiomyopathy.  He was a delayed presentation of anterolateral myocardial infarction 11/24/2018.  He underwent cardiac catheterization by Dr. Fletcher Anon.  On 11/24/2018 he received PCI with DES to his LAD x2.  His cardiac catheterization showed proximal-mid RCA 95% stenosis, distal RCA 30% stenosis, 100% RP AV stenosis, distal circumflex 99% stenosis, ostial LAD to proximal LAD 100% stenosis, mid LAD 130% stenosis, mid LAD 200% stenosis, EF 25%.  His PCI was staged  and on 11/26/2018 he received DES x1 to his right posterior atrioventricular artery.  Echocardiogram showed an EF of 20-25%.  He was discharged with a LifeVest.  He was seen by Dr. Gwenlyn Found in follow-up.  During that time he was mowing lawns, working out, and walking frequently without limitations.  His echocardiogram showed an improvement from his LVEF to 35--40%.  His LifeVest was discontinued.  He was following a low-sodium diet and his lipid panel 07/14/2019 showed an LDL of 72.  He was again seen by Dr. Gwenlyn Found on 09/23/2019.  He continued to do well.  He reported continued physical activity and no symptoms.  He had gained 25 pounds which he attributed to working out and muscle weight.  He contacted nurse triage line on 07/03/2020 and indicated that he was having left/right hand/arm tingling, pain in the back of his neck, and intermittent sharp pain in the left side of his chest.  He was also experiencing irregular heartbeat and worsening shortness of breath.  He denied lightheadedness and syncope.  He reported that he had presented to his PCP who told him to see his chiropractor regarding his neck pain.  He reported that his chest pain had been intermittent and that he was not having any current chest pain.  He denied lower extremity swelling but felt he did have some abdominal swelling.  He reported compliance with his Lasix and spironolactone.  He presents to the clinic today for evaluation states he has gained several pounds and is not exercising as much. 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 However, over the last couple weeks he has noticed increased work of breathing. When asked about chest discomfort he reports that he has intermittent episodes of left-sided chest discomfort. It is reproducible with deep palpation during exam. He also reports neck pain and arm pain which appears to be related to nerve impingement. I have asked him to  rest the area, use heat, and range of motion activities. I will order an echocardiogram to reassess his EF, give him salty 6 diet sheet, have him increase his physical activity as tolerated and follow-up after his exam.  Today he denies chest pain, increased shortness of breath, lower extremity edema, fatigue, palpitations, melena, hematuria, hemoptysis, diaphoresis, weakness, presyncope, syncope, orthopnea, and PND.   Home Medications    Prior to Admission medications   Medication Sig Start Date End Date Taking? Authorizing Provider  amoxicillin-clavulanate (AUGMENTIN) 875-125 MG tablet Take 1 tablet by mouth 2 (two) times daily. Take all of this medication 03/21/20   Claretta Fraise, MD  aspirin EC 81 MG tablet Take 81 mg by mouth daily.    [provider]  atorvastatin (LIPITOR) 80 MG tablet Take 1 tablet (80 mg total) by mouth daily at 6 PM. 05/24/20   Dettinger, Fransisca Kaufmann, MD  BD PEN NEEDLE NANO 2ND GEN 32G X 4 MM MISC Use daily with insulin Dx E11.9, Z79.4 06/22/20   Dettinger, Fransisca Kaufmann, MD  Blood Glucose Monitoring Suppl (ACCU-CHEK AVIVA) device Test BS BID Dx E11.69 05/24/20   Dettinger, Fransisca Kaufmann, MD  BRILINTA 90 MG TABS tablet TAKE 1 TABLET (90 MG TOTAL) BY MOUTH 2 (TWO) TIMES DAILY. 06/20/20   Lorretta Harp, MD  carvedilol (COREG) 12.5 MG tablet Take 1 tablet (12.5 mg total) by mouth 2 (two) times daily with a meal. 05/24/20   Dettinger, Fransisca Kaufmann, MD  Dulaglutide (TRULICITY) 1.5 UK/0.2RK SOPN Inject 1.5 mg into the skin once a week. 05/30/20   Dettinger, Fransisca Kaufmann, MD  famotidine (PEPCID) 20 MG tablet Take 1 tablet (20 mg total) by mouth 2 (two) times daily. 05/24/20   Dettinger, Fransisca Kaufmann, MD  fenofibrate (TRICOR) 145 MG tablet Take 1 tablet (145 mg total) by mouth daily. 05/24/20   Dettinger, Fransisca Kaufmann, MD  fluticasone (FLONASE) 50 MCG/ACT nasal spray PLACE 1 SPRAY INTO BOTH NOSTRILS DAILY. 12/12/19   Dettinger, Fransisca Kaufmann, MD  furosemide (LASIX) 20 MG tablet Take 1 tablet (20 mg total) by mouth  daily. 05/24/20   Dettinger, Fransisca Kaufmann, MD  glucose blood (ACCU-CHEK AVIVA PLUS) test strip 1 each by Other route in the morning and at bedtime. Use as instructed 06/22/20   Dettinger, Fransisca Kaufmann, MD  insulin detemir (LEVEMIR FLEXTOUCH) 100 UNIT/ML FlexPen Inject 30 Units into the skin at bedtime. 05/24/20   Dettinger, Fransisca Kaufmann, MD  JARDIANCE 25 MG TABS tablet Take 1 tablet (25 mg total) by mouth daily. 05/24/20   Dettinger, Fransisca Kaufmann, MD  losartan (COZAAR) 25 MG tablet Take 1 tablet (25 mg total) by mouth at bedtime. 05/24/20   Dettinger, Fransisca Kaufmann, MD  nitroGLYCERIN (NITROSTAT) 0.4 MG SL tablet Place 1 tablet (0.4 mg total) under the tongue every 5 (five) minutes x 3 doses as needed for chest pain. 11/28/18   Mikhail, Velta Addison, DO  Omega-3 Fatty Acids (FISH OIL) 1000 MG CAPS Take 2,000 mg by mouth daily.    [provider]  potassium chloride (KLOR-CON) 10 MEQ tablet TAKE 1 TABLET  BY MOUTH EVERY DAY 05/17/20   Dettinger, Fransisca Kaufmann, MD  spironolactone (ALDACTONE) 25 MG tablet TAKE 1/2 TABLET BY MOUTH EVERY DAY 10/27/19   Lorretta Harp, MD    Family History    Family History  Problem Relation Age of Onset  . Diabetes Mother   . Stroke Mother   . Early death Father   . Heart attack Father        died from heart attack at the age of 42  . Alcohol abuse Father   . Heart disease Father   . Hypertension Brother   . Heart attack Maternal Grandfather        died from heart attack at the age of 44  . Early death Paternal Grandfather   . Heart attack Paternal Grandfather   . Colon cancer Neg Hx   . Esophageal cancer Neg Hx   . Rectal cancer Neg Hx   . Stomach cancer Neg Hx    He indicated that his mother is alive. He indicated that his father is deceased. He indicated that his sister is alive. He indicated that his brother is alive. He indicated that his maternal grandmother is deceased. He indicated that his maternal grandfather is deceased. He indicated that his paternal grandmother is deceased.  He indicated that his paternal grandfather is deceased. He indicated that the status of his neg hx is unknown.  Social History    Social History   Socioeconomic History  . Marital status: Single    Spouse name: Not on file  . Number of children: Not on file  . Years of education: Not on file  . Highest education level: Not on file  Occupational History  . Not on file  Tobacco Use  . Smoking status: Never Smoker  . Smokeless tobacco: Never Used  Vaping Use  . Vaping Use: Never used  Substance and Sexual Activity  . Alcohol use: Yes    Alcohol/week: 12.0 standard drinks    Types: 12 Cans of beer per week  . Drug use: No  . Sexual activity: Yes    Comment: male 8 months partner  Other Topics Concern  . Not on file  Social History Narrative  . Not on file   Social Determinants of Health   Financial Resource Strain: Not on file  Food Insecurity: Not on file  Transportation Needs: Not on file  Physical Activity: Not on file  Stress: Not on file  Social Connections: Not on file  Intimate Partner Violence: Not on file     Review of Systems    General:  No chills, fever, night sweats or weight changes.  Cardiovascular:  No chest pain, dyspnea on exertion, edema, orthopnea, palpitations, paroxysmal nocturnal dyspnea. Dermatological: No rash, lesions/masses Respiratory: No cough, dyspnea Urologic: No hematuria, dysuria Abdominal:   No nausea, vomiting, diarrhea, bright red blood per rectum, melena, or hematemesis Neurologic:  No visual changes, wkns, changes in mental status. All other systems reviewed and are otherwise negative except as noted above.  Physical Exam    VS:  BP 122/80 (BP Location: Left Arm, Patient Position: Sitting)   Pulse 74   Ht 6\' 4"  (1.93 m)   Wt 293 lb 12.8 oz (133.3 kg)   SpO2 96%   BMI 35.76 kg/m  , BMI Body mass index is 35.76 kg/m. GEN: Well nourished, well developed, in no acute distress. HEENT: normal. Neck: Supple, no JVD, carotid  bruits, or masses. Cardiac: RRR, no murmurs, rubs, or gallops. No  clubbing, cyanosis, edema.  Radials/DP/PT 2+ and equal bilaterally.  Respiratory:  Respirations regular and unlabored, clear to auscultation bilaterally. GI: Soft, nontender, nondistended, BS + x 4. MS: no deformity or atrophy. Chest pain reproducible with deep palpation. Skin: warm and dry, no rash. Neuro:  Strength and sensation are intact. Psych: Normal affect.  Accessory Clinical Findings    Recent Labs: 01/12/2020: TSH 1.850 05/24/2020: ALT 20; BUN 14; Creatinine, Ser 1.11; Hemoglobin 17.0; Platelets 249; Potassium 4.3; Sodium 139   Recent Lipid Panel    Component Value Date/Time   CHOL 162 05/24/2020 1426   TRIG 464 (H) 05/24/2020 1426   HDL 32 (L) 05/24/2020 1426   CHOLHDL 5.1 (H) 05/24/2020 1426   CHOLHDL 4.9 11/25/2018 0431   VLDL 31 11/25/2018 0431   LDLCALC 59 05/24/2020 1426    ECG personally reviewed by me today-normal sinus rhythm with sinus arrhythmia possible left atrial enlargement left axis deviation septal infarct undetermined age 28 bpm- No acute changes  Cardiac catheterization 11/24/2018   Prox RCA to Mid RCA lesion is 95% stenosed.  Dist RCA lesion is 30% stenosed.  RPAV lesion is 100% stenosed.  Dist Cx lesion is 99% stenosed.  There is severe left ventricular systolic dysfunction.  LV end diastolic pressure is severely elevated.  The left ventricular ejection fraction is less than 25% by visual estimate.  Ost LAD to Prox LAD lesion is 100% stenosed.  Post intervention, there is a 0% residual stenosis.  A drug-eluting stent was successfully placed using a STENT RESOLUTE ONYX 3.5X22.  A drug-eluting stent was successfully placed using a [place] [id number].  Mid LAD-1 lesion is 30% stenosed.  Mid LAD-2 lesion is 100% stenosed.  Post intervention, there is a 0% residual stenosis.   1.  Severe three-vessel coronary artery disease.  Occluded ostial and mid LAD likely  due to late presenting anterior STEMI.  Significant distal left circumflex disease but the supplied territory is small.  Significant mid RCA stenosis with occluded posterior AV groove with left-to-right collaterals. 2.  Severely reduced LV systolic function with an EF of 20 to 25% with anterior wall akinesis.  LVEDP was 32 mmHg. 3.  Successful PCI and drug-eluting stent placement to the ostial as well as mid LAD.  Recommendations: The patient was given furosemide 40 mg IV after the case.  Continue furosemide 40 mg by mouth twice daily starting tomorrow. I started small dose carvedilol. If renal function remains stable, recommend adding a small dose ARB with plans to transition to [facility] as an outpatient.  In addition, should consider spironolactone if blood pressure allows. Recommend dual antiplatelet therapy for at least 1 year. Recommend staged mid RCA PCI likely on Friday if the patient remains stable.  The rest of his coronary artery disease should be treated medically.  Diagnostic Dominance: Right    Intervention       Cardiac catheterization 11/26/2018  Previously placed Ost LAD to Prox LAD drug eluting stent is widely patent.  Balloon angioplasty was performed.  Previously placed Mid LAD-2 drug eluting stent is widely patent.  Balloon angioplasty was performed.  Mid LAD-1 lesion is 30% stenosed.  Dist Cx lesion is 99% stenosed.  Prox RCA to Mid RCA lesion is 95% stenosed.  Dist RCA lesion is 30% stenosed.  RPAV lesion is 100% stenosed.  Post intervention, there is a 0% residual stenosis.  A drug-eluting stent was successfully placed using a STENT RESOLUTE ONYX 2.75X18.   1.  Widely patent LAD stents  with unchanged coronary anatomy. 2.  LVEDP was 13 mmHg with mild hypotension.  The patient was given 250 normal saline bolus. 3.  Successful angioplasty and drug-eluting stent placement to the proximal/mid right coronary artery.  Recommendations: Continue dual  antiplatelet therapy. I decreased furosemide to 40 mg once daily.  Continue other heart failure medications.  Given intermittent hypotension.  I do not recommend starting Entresto at the present time.  I elected to start small dose losartan.  If this is well-tolerated, he can be switched as an outpatient to [facility].  I agree with the LifeVest.  Diagnostic Dominance: Right    Intervention      Echocardiogram 02/23/2019  IMPRESSIONS    1. Left ventricular ejection fraction, by visual estimation, is 35 to  40%. The left ventricle has moderately decreased function. There is no  left ventricular hypertrophy.  2. Left ventricular diastolic Doppler parameters are consistent with  pseudonormalization pattern of LV diastolic filling.  3. There is moderate hypokinesis of the apex.  4. Global right ventricle has normal systolic function.The right  ventricular size is normal. No increase in right ventricular wall  thickness.  5. Left atrial size was moderately dilated.  6. Right atrial size was mildly dilated.  7. The mitral valve is normal in structure. Trace mitral valve  regurgitation.  8. The tricuspid valve is normal in structure. Tricuspid valve  regurgitation is trivial.  9. The aortic valve is tricuspid Aortic valve regurgitation was not  visualized by color flow Doppler.  10. The pulmonic valve was normal in structure. Pulmonic valve  regurgitation is not visualized by color flow Doppler.  11. Normal pulmonary artery systolic pressure.  12. The atrial septum is grossly normal.   Assessment & Plan   1.  Chest wall pain-reports intermittent episodes of chest discomfort. Reproducible with deep palpation. Underwent staged cardiac catheterization 11/24/2018 and 11/26/2018.  Received total DES x3 to LAD and RPDA.  Echocardiogram 02/23/2019 showed an improvement in his ejection fraction to 35-40%.  Current symptoms appear to be related to muscle strain/sprain. Heart healthy  low-sodium diet-salty 6 given Increase physical activity as tolerated Repeat echocardiogram  Coronary artery disease-no chest pain today.Underwent staged cardiac catheterization 11/24/2018 and 11/26/2018.  Received total DES x3 to LAD and RPDA.   Continue atorvastatin, carvedilol, aspirin, Brilinta, losartan, nitroglycerin, omega-3 fatty acids Heart healthy low-sodium diet-salty 6 given Maintain physical activity  Ischemic cardiomyopathy-has noticed some increased activity intolerance and dyspnea on exertion over the last 2 weeks. Echocardiogram 02/23/2019 showed no improvement in his EF 35-40%. Continue spironolactone, potassium, omega-3, furosemide, carvedilol Heart healthy low-sodium diet-salty 6 given Maintain physical activity  Hyperlipidemia-05/24/2020: Cholesterol, Total 162; HDL 32; LDL Chol Calc (NIH) 59; Triglycerides 464 Continue atorvastatin, fenofibrate Heart healthy low-sodium high-fiber diet Maintain physical activity  Obesity-weight today 293 pounds.   Heart healthy low-sodium diet Maintain physical activity  Disposition: Follow-up with Dr. Gwenlyn Found or me in 46months.   Jossie Ng. Yexalen Deike NP-C    07/04/2020, 11:13 AM [place] [address] Office [phone] Fax [phone]  Notice: This dictation was prepared with Dragon dictation along with smaller phrase technology. Any transcriptional errors that result from this process are unintentional and may not be corrected upon review.  I spent 15 minutes examining this patient, reviewing medications, and using patient centered shared decision making involving her cardiac care.  Prior to her visit I spent greater than 20 minutes reviewing her past medical history,  medications, and prior cardiac tests.

## 2020-07-03 NOTE — Telephone Encounter (Signed)
Patient c/o Palpitations:  High priority if patient c/o lightheadedness, shortness of breath, or chest pain  How long have you had palpitations/irregular HR/ Afib? Are you having the symptoms now? Patient states his HR has been irregular for the past few weeks, but it feels normal right now 1) Are you currently experiencing lightheadedness, SOB or CP? SOB, lightheadedness  2) Do you have a history of afib (atrial fibrillation) or irregular heart rhythm? Patient is unsure  3) Have you checked your BP or HR? (document readings if available): no   4) Are you experiencing any other symptoms? Sob, lightheadedness (denies dizziness)  Pt c/o Shortness Of Breath: STAT if SOB developed within the last 24 hours or pt is noticeably SOB on the phone  1. Are you currently SOB (can you hear that pt is SOB on the phone)? Yes   2. How long have you been experiencing SOB? 1 week on and off   3. Are you SOB when sitting or when up moving around? When up and moving around  4. Are you currently experiencing any other symptoms? Lightheadedness (denies dizziness)

## 2020-07-03 NOTE — Telephone Encounter (Signed)
Spoke with patient who states that he has been experiencing R/L Hand/arm tingling, pain in the back of his neck, intermittent sharp chest pain on the left side, irregular heart beat and worsening shortness of breath. Patient denies any lightheadedness or syncope. Patient states that his PCP told him to see a chiropractor regarding his neck pain. Patient reports his chest pain has been intermittent and he has not experienced any today or at present time. Patient states his shortness of breath has worsened and now is uncomfortable to lie down on his back. Patient states he is currently taking his lasix and spironolactone but is unable to tell if he has gained any weight due to not doing daily weights. Patient states he does not have any swelling in his legs, ankles, or feet but does state that his abdomen may be slightly swollen. Patient also reports a feeling of being full and sometimes vomiting up his food after eating. Patient not able to provide blood pressure readings or heart rate readings at this time, but states that his heart rate does feel irregular at times.   Made patient an appointment to be seen tomorrow 2/16 at 10:45am with [name] NP. Advised patient that if he develops worsening symptoms, chest pain, or any additional symptoms he should proceed to the ED for evaluation. ED precautions discussed at length with the patient. Patient verbalized understanding.

## 2020-07-04 ENCOUNTER — Other Ambulatory Visit: Payer: Self-pay

## 2020-07-04 ENCOUNTER — Ambulatory Visit (INDEPENDENT_AMBULATORY_CARE_PROVIDER_SITE_OTHER): Payer: Medicaid Other | Admitting: General Practice

## 2020-07-04 ENCOUNTER — Encounter: Payer: Self-pay | Admitting: General Practice

## 2020-07-04 VITALS — BP 122/80 | HR 74 | Ht 76.0 in | Wt 293.8 lb

## 2020-07-04 DIAGNOSIS — I255 Ischemic cardiomyopathy: Secondary | ICD-10-CM

## 2020-07-04 DIAGNOSIS — I2510 Atherosclerotic heart disease of native coronary artery without angina pectoris: Secondary | ICD-10-CM | POA: Diagnosis not present

## 2020-07-04 DIAGNOSIS — Z9861 Coronary angioplasty status: Secondary | ICD-10-CM

## 2020-07-04 DIAGNOSIS — E785 Hyperlipidemia, unspecified: Secondary | ICD-10-CM

## 2020-07-04 DIAGNOSIS — E1169 Type 2 diabetes mellitus with other specified complication: Secondary | ICD-10-CM | POA: Diagnosis not present

## 2020-07-04 DIAGNOSIS — R0789 Other chest pain: Secondary | ICD-10-CM | POA: Diagnosis not present

## 2020-07-04 DIAGNOSIS — E66811 Obesity, class 1: Secondary | ICD-10-CM

## 2020-07-04 DIAGNOSIS — E669 Obesity, unspecified: Secondary | ICD-10-CM

## 2020-07-04 NOTE — Patient Instructions (Signed)
Medication Instructions:  The current medical regimen is effective;  continue present plan and medications as directed. Please refer to the Current Medication list given to you today.  *If you need a refill on your cardiac medications before your next appointment, please call your pharmacy*  Lab Work:   Testing/Procedures:  NONE    NONE  Special Instructions TAKE AND LOG YOUR BLOOD PRESSURE  PLEASE READ AND FOLLOW SALTY 6-ATTACHED-1,800mg  daily  PLEASE INCREASE PHYSICAL ACTIVITY AS TOLERATED  Echocardiogram - Your physician has requested that you have an echocardiogram. Echocardiography is a painless test that uses sound waves to create images of your heart. It provides your doctor with information about the size and shape of your heart and how well your heart's chambers and valves are working. This procedure takes approximately one hour. There are no restrictions for this procedure. This will be performed at our [facility] location - [address].  Follow-Up: Your next appointment:  AFTER ECHO  In Person with Quay Burow, MD OR IF UNAVAILABLE [place], FNP-C  At [facility], you and your health needs are our priority.  As part of our continuing mission to provide you with exceptional heart care, we have created designated Provider Care Teams.  These Care Teams include your primary Cardiologist (physician) and Advanced Practice Providers (APPs -  Physician Assistants and Nurse Practitioners) who all work together to provide you with the care you need, when you need it.            6 SALTY THINGS TO AVOID     1,800MG  DAILY

## 2020-07-05 DIAGNOSIS — Z1159 Encounter for screening for other viral diseases: Secondary | ICD-10-CM | POA: Diagnosis not present

## 2020-07-16 DIAGNOSIS — M9902 Segmental and somatic dysfunction of thoracic region: Secondary | ICD-10-CM | POA: Diagnosis not present

## 2020-07-16 DIAGNOSIS — M62830 Muscle spasm of back: Secondary | ICD-10-CM | POA: Diagnosis not present

## 2020-07-16 DIAGNOSIS — M9901 Segmental and somatic dysfunction of cervical region: Secondary | ICD-10-CM | POA: Diagnosis not present

## 2020-07-16 DIAGNOSIS — M9903 Segmental and somatic dysfunction of lumbar region: Secondary | ICD-10-CM | POA: Diagnosis not present

## 2020-07-17 DIAGNOSIS — M9903 Segmental and somatic dysfunction of lumbar region: Secondary | ICD-10-CM | POA: Diagnosis not present

## 2020-07-17 DIAGNOSIS — M9901 Segmental and somatic dysfunction of cervical region: Secondary | ICD-10-CM | POA: Diagnosis not present

## 2020-07-17 DIAGNOSIS — M62830 Muscle spasm of back: Secondary | ICD-10-CM | POA: Diagnosis not present

## 2020-07-17 DIAGNOSIS — M9902 Segmental and somatic dysfunction of thoracic region: Secondary | ICD-10-CM | POA: Diagnosis not present

## 2020-07-19 DIAGNOSIS — M9903 Segmental and somatic dysfunction of lumbar region: Secondary | ICD-10-CM | POA: Diagnosis not present

## 2020-07-19 DIAGNOSIS — M9902 Segmental and somatic dysfunction of thoracic region: Secondary | ICD-10-CM | POA: Diagnosis not present

## 2020-07-19 DIAGNOSIS — M9901 Segmental and somatic dysfunction of cervical region: Secondary | ICD-10-CM | POA: Diagnosis not present

## 2020-07-19 DIAGNOSIS — M62830 Muscle spasm of back: Secondary | ICD-10-CM | POA: Diagnosis not present

## 2020-07-26 ENCOUNTER — Other Ambulatory Visit (HOSPITAL_COMMUNITY): Payer: Medicaid Other

## 2020-07-27 ENCOUNTER — Encounter (HOSPITAL_COMMUNITY): Payer: Self-pay | Admitting: General Practice

## 2020-07-30 ENCOUNTER — Ambulatory Visit: Payer: Medicaid Other | Admitting: Family Medicine

## 2020-08-03 ENCOUNTER — Ambulatory Visit: Payer: Medicaid Other | Admitting: Cardiovascular Disease

## 2020-08-07 ENCOUNTER — Encounter: Payer: Self-pay | Admitting: Family Medicine

## 2020-08-07 DIAGNOSIS — H5213 Myopia, bilateral: Secondary | ICD-10-CM | POA: Diagnosis not present

## 2020-08-13 ENCOUNTER — Encounter: Payer: Medicaid Other | Admitting: Gastroenterology

## 2020-08-14 ENCOUNTER — Encounter: Payer: Self-pay | Admitting: Family Medicine

## 2020-08-14 ENCOUNTER — Other Ambulatory Visit: Payer: Self-pay

## 2020-08-14 ENCOUNTER — Ambulatory Visit: Payer: Medicaid Other | Admitting: Family Medicine

## 2020-08-14 VITALS — BP 133/89 | HR 72 | Temp 97.3°F | Ht 76.0 in | Wt 288.8 lb

## 2020-08-14 DIAGNOSIS — E11610 Type 2 diabetes mellitus with diabetic neuropathic arthropathy: Secondary | ICD-10-CM | POA: Diagnosis not present

## 2020-08-14 DIAGNOSIS — E1165 Type 2 diabetes mellitus with hyperglycemia: Secondary | ICD-10-CM

## 2020-08-14 DIAGNOSIS — E11628 Type 2 diabetes mellitus with other skin complications: Secondary | ICD-10-CM

## 2020-08-14 DIAGNOSIS — Z794 Long term (current) use of insulin: Secondary | ICD-10-CM

## 2020-08-14 DIAGNOSIS — E1159 Type 2 diabetes mellitus with other circulatory complications: Secondary | ICD-10-CM | POA: Diagnosis not present

## 2020-08-14 DIAGNOSIS — L84 Corns and callosities: Secondary | ICD-10-CM

## 2020-08-14 DIAGNOSIS — E785 Hyperlipidemia, unspecified: Secondary | ICD-10-CM

## 2020-08-14 DIAGNOSIS — IMO0002 Reserved for concepts with insufficient information to code with codable children: Secondary | ICD-10-CM

## 2020-08-14 DIAGNOSIS — E1169 Type 2 diabetes mellitus with other specified complication: Secondary | ICD-10-CM | POA: Diagnosis not present

## 2020-08-14 DIAGNOSIS — I152 Hypertension secondary to endocrine disorders: Secondary | ICD-10-CM | POA: Diagnosis not present

## 2020-08-14 LAB — BAYER DCA HB A1C WAIVED: HB A1C (BAYER DCA - WAIVED): 7.5 % — ABNORMAL HIGH (ref <7.0)

## 2020-08-14 MED ORDER — FENOFIBRATE 145 MG PO TABS: 145.0000 mg | ORAL_TABLET | Freq: Every day | ORAL | 3 refills | Status: DC

## 2020-08-14 MED ORDER — FUROSEMIDE 20 MG PO TABS: 20.0000 mg | ORAL_TABLET | Freq: Every day | ORAL | 3 refills | Status: DC

## 2020-08-14 MED ORDER — JARDIANCE 25 MG PO TABS: 25.0000 mg | ORAL_TABLET | Freq: Every day | ORAL | 11 refills | Status: DC

## 2020-08-14 NOTE — Progress Notes (Signed)
BP 133/89   Pulse 72   Temp (!) 97.3 F (36.3 C) (Temporal)   Ht _0  (1.93 m)   Wt 288 lb 12.8 oz (131 kg)   BMI 35.15 kg/m    Subjective:   Patient ID: Evan [name], male    DOB: 06/21/1965, 55 y.o.   MRN: [id number]  HPI: Evan [name] is a 55 y.o. male presenting on 08/14/2020 for Medical Management of Chronic Issues and Diabetes   HPI Type 2 diabetes mellitus Patient comes in today for recheck of his diabetes. Patient has been currently taking Trulicity and Jardiance. Patient is currently on an ACE inhibitor/ARB. Patient has not seen an ophthalmologist this year. Patient denies any issues with their feet. The symptom started onset as an adult hypertension and hyperlipidemia and cardiomyopathy ARE RELATED TO DM   Hypertension Patient is currently on carvedilol and losartan and spironolactone and furosemide, and their blood pressure today is 133/89. Patient denies any lightheadedness or dizziness. Patient denies headaches, blurred vision, chest pains, shortness of breath, or weakness. Denies any side effects from medication and is content with current medication.   Hyperlipidemia Patient is coming in for recheck of his hyperlipidemia. The patient is currently taking fish oil and fenofibrate and atorvastatin. They deny any issues with myalgias or history of liver damage from it. They deny any focal numbness or weakness or chest pain.   Relevant past medical, surgical, family and social history reviewed and updated as indicated. Interim medical history since our last visit reviewed. Allergies and medications reviewed and updated.  Review of Systems  Constitutional: Negative for chills and fever.  Eyes: Negative for visual disturbance.  Respiratory: Negative for shortness of breath and wheezing.   Cardiovascular: Negative for chest pain and leg swelling.  Musculoskeletal: Negative for back pain and gait problem.  Skin: Negative for rash.  Neurological: Negative for  dizziness, weakness and light-headedness.  All other systems reviewed and are negative.   Per HPI unless specifically indicated above   Allergies as of 08/14/2020   No Known Allergies     Medication List       Accurate as of August 14, 2020 12:03 PM. If you have any questions, ask your nurse or doctor.        Accu-Chek Aviva device Test BS BID Dx E11.69   Accu-Chek Aviva Plus test strip Generic drug: glucose blood 1 each by Other route in the morning and at bedtime. Use as instructed   aspirin EC 81 MG tablet Take 81 mg by mouth daily.   atorvastatin 80 MG tablet Commonly known as: LIPITOR Take 1 tablet (80 mg total) by mouth daily at 6 PM.   BD Pen Needle Nano 2nd Gen 32G X 4 MM Misc Generic drug: Insulin Pen Needle Use daily with insulin Dx E11.9, Z79.4   Brilinta 90 MG Tabs tablet Generic drug: ticagrelor TAKE 1 TABLET (90 MG TOTAL) BY MOUTH 2 (TWO) TIMES DAILY.   carvedilol 12.5 MG tablet Commonly known as: COREG Take 1 tablet (12.5 mg total) by mouth 2 (two) times daily with a meal.   famotidine 20 MG tablet Commonly known as: Pepcid Take 1 tablet (20 mg total) by mouth 2 (two) times daily.   fenofibrate 145 MG tablet Commonly known as: TRICOR Take 1 tablet (145 mg total) by mouth daily.   Fish Oil 1000 MG Caps Take 2,000 mg by mouth daily.   fluticasone 50 MCG/ACT nasal spray Commonly known as: FLONASE PLACE 1 SPRAY  INTO BOTH NOSTRILS DAILY.   furosemide 20 MG tablet Commonly known as: LASIX Take 1 tablet (20 mg total) by mouth daily.   Jardiance 25 MG Tabs tablet Generic drug: empagliflozin Take 1 tablet (25 mg total) by mouth daily.   losartan 25 MG tablet Commonly known as: COZAAR Take 1 tablet (25 mg total) by mouth at bedtime.   nitroGLYCERIN 0.4 MG SL tablet Commonly known as: NITROSTAT Place 1 tablet (0.4 mg total) under the tongue every 5 (five) minutes x 3 doses as needed for chest pain.   potassium chloride 10 MEQ  tablet Commonly known as: KLOR-CON TAKE 1 TABLET BY MOUTH EVERY DAY   spironolactone 25 MG tablet Commonly known as: ALDACTONE TAKE 1/2 TABLET BY MOUTH EVERY DAY   Trulicity 1.5 YT/0.1SW Sopn Generic drug: Dulaglutide Inject 1.5 mg into the skin once a week.        Objective:   BP 133/89   Pulse 72   Temp (!) 97.3 F (36.3 C) (Temporal)   Ht _0  (1.93 m)   Wt 288 lb 12.8 oz (131 kg)   BMI 35.15 kg/m   Wt Readings from Last 3 Encounters:  08/14/20 288 lb 12.8 oz (131 kg)  07/04/20 293 lb 12.8 oz (133.3 kg)  05/24/20 291 lb (132 kg)    Physical Exam Vitals and nursing note reviewed.  Constitutional:      General: He is not in acute distress.    Appearance: He is well-developed. He is not diaphoretic.  Eyes:     General: No scleral icterus.    Conjunctiva/sclera: Conjunctivae normal.  Neck:     Thyroid: No thyromegaly.  Cardiovascular:     Rate and Rhythm: Normal rate and regular rhythm.     Heart sounds: Normal heart sounds. No murmur heard.   Pulmonary:     Effort: Pulmonary effort is normal. No respiratory distress.     Breath sounds: Normal breath sounds. No wheezing.  Musculoskeletal:        General: Normal range of motion.     Cervical back: Neck supple.  Lymphadenopathy:     Cervical: No cervical adenopathy.  Skin:    General: Skin is warm and dry.     Findings: No rash.  Neurological:     Mental Status: He is alert and oriented to person, place, and time.     Coordination: Coordination normal.  Psychiatric:        Behavior: Behavior normal.       Assessment & Plan:   Problem List Items Addressed This Visit      Cardiovascular and Mediastinum   Hypertension associated with diabetes ([place])   Relevant Medications   furosemide (LASIX) 20 MG tablet   JARDIANCE 25 MG TABS tablet   fenofibrate (TRICOR) 145 MG tablet   Other Relevant Orders   CBC with Differential/Platelet   CMP14+EGFR     Endocrine   Type 2 diabetes mellitus with  pressure callus (HCC)   Relevant Medications   JARDIANCE 25 MG TABS tablet   fenofibrate (TRICOR) 145 MG tablet   Hyperlipidemia associated with type 2 diabetes mellitus (HCC)   Relevant Medications   JARDIANCE 25 MG TABS tablet   fenofibrate (TRICOR) 145 MG tablet   Other Relevant Orders   CBC with Differential/Platelet   CMP14+EGFR   Lipid panel    Other Visit Diagnoses    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  A1c 7.5 so improved from last time, continue to focus on diet and exercise. Follow up plan: Return in about 3 months (around 11/14/2020), or if symptoms worsen or fail to improve, for Diabetes and hypertension and cholesterol.  Counseling provided for all of the vaccine components Orders Placed This Encounter  Procedures  . Bayer DCA Hb A1c Waived  . CBC with Differential/Platelet  . CMP14+EGFR  . Lipid panel    Caryl Pina, MD [facility] 08/14/2020, 12:03 PM

## 2020-08-15 LAB — CBC WITH DIFFERENTIAL/PLATELET
Basophils Absolute: 0.1 10*3/uL (ref 0.0–0.2)
Basos: 1 %
EOS (ABSOLUTE): 0.3 10*3/uL (ref 0.0–0.4)
Eos: 3 %
Hematocrit: 44.7 % (ref 37.5–51.0)
Hemoglobin: 15 g/dL (ref 13.0–17.7)
Immature Grans (Abs): 0 10*3/uL (ref 0.0–0.1)
Immature Granulocytes: 0 %
Lymphocytes Absolute: 1.8 10*3/uL (ref 0.7–3.1)
Lymphs: 17 %
MCH: 31 pg (ref 26.6–33.0)
MCHC: 33.6 g/dL (ref 31.5–35.7)
MCV: 92 fL (ref 79–97)
Monocytes Absolute: 0.7 10*3/uL (ref 0.1–0.9)
Monocytes: 7 %
Neutrophils Absolute: 7.3 10*3/uL — ABNORMAL HIGH (ref 1.4–7.0)
Neutrophils: 72 %
Platelets: 227 10*3/uL (ref 150–450)
RBC: 4.84 x10E6/uL (ref 4.14–5.80)
RDW: 12.4 % (ref 11.6–15.4)
WBC: 10.3 10*3/uL (ref 3.4–10.8)

## 2020-08-15 LAB — CMP14+EGFR
ALT: 18 IU/L (ref 0–44)
AST: 14 IU/L (ref 0–40)
Albumin/Globulin Ratio: 1.6 (ref 1.2–2.2)
Albumin: 4.3 g/dL (ref 3.8–4.9)
Alkaline Phosphatase: 89 IU/L (ref 44–121)
BUN/Creatinine Ratio: 12 (ref 9–20)
BUN: 14 mg/dL (ref 6–24)
Bilirubin Total: 0.4 mg/dL (ref 0.0–1.2)
CO2: 21 mmol/L (ref 20–29)
Calcium: 8.8 mg/dL (ref 8.7–10.2)
Chloride: 105 mmol/L (ref 96–106)
Creatinine, Ser: 1.14 mg/dL (ref 0.76–1.27)
Globulin, Total: 2.7 g/dL (ref 1.5–4.5)
Glucose: 142 mg/dL — ABNORMAL HIGH (ref 65–99)
Potassium: 4.7 mmol/L (ref 3.5–5.2)
Sodium: 142 mmol/L (ref 134–144)
Total Protein: 7 g/dL (ref 6.0–8.5)
eGFR: 76 mL/min/{1.73_m2} (ref >59)

## 2020-08-15 LAB — LIPID PANEL
Chol/HDL Ratio: 3.6 ratio (ref 0.0–5.0)
Cholesterol, Total: 129 mg/dL (ref 100–199)
HDL: 36 mg/dL — ABNORMAL LOW (ref >39)
LDL Chol Calc (NIH): 69 mg/dL (ref 0–99)
Triglycerides: 132 mg/dL (ref 0–149)
VLDL Cholesterol Cal: 24 mg/dL (ref 5–40)

## 2020-08-23 ENCOUNTER — Other Ambulatory Visit: Payer: Self-pay

## 2020-08-23 ENCOUNTER — Ambulatory Visit (HOSPITAL_COMMUNITY): Payer: Medicaid Other | Attending: Cardiovascular Disease

## 2020-08-23 DIAGNOSIS — I255 Ischemic cardiomyopathy: Secondary | ICD-10-CM | POA: Insufficient documentation

## 2020-08-23 LAB — ECHOCARDIOGRAM COMPLETE
Area-P 1/2: 3.91 cm2
S' Lateral: 4.1 cm

## 2020-08-23 MED ORDER — PERFLUTREN LIPID MICROSPHERE
1.0000 mL | INTRAVENOUS | Status: AC | PRN
  Administered 2020-08-23: 2 mL via INTRAVENOUS

## 2020-08-28 ENCOUNTER — Telehealth: Payer: Self-pay

## 2020-08-28 ENCOUNTER — Ambulatory Visit (INDEPENDENT_AMBULATORY_CARE_PROVIDER_SITE_OTHER): Payer: Medicaid Other | Admitting: Gastroenterology

## 2020-08-28 ENCOUNTER — Encounter: Payer: Self-pay | Admitting: Gastroenterology

## 2020-08-28 VITALS — BP 100/72 | HR 78 | Ht 76.0 in | Wt 283.0 lb

## 2020-08-28 DIAGNOSIS — I2510 Atherosclerotic heart disease of native coronary artery without angina pectoris: Secondary | ICD-10-CM | POA: Diagnosis not present

## 2020-08-28 DIAGNOSIS — Z9861 Coronary angioplasty status: Secondary | ICD-10-CM | POA: Diagnosis not present

## 2020-08-28 DIAGNOSIS — K219 Gastro-esophageal reflux disease without esophagitis: Secondary | ICD-10-CM | POA: Diagnosis not present

## 2020-08-28 DIAGNOSIS — Z1211 Encounter for screening for malignant neoplasm of colon: Secondary | ICD-10-CM

## 2020-08-28 DIAGNOSIS — I255 Ischemic cardiomyopathy: Secondary | ICD-10-CM

## 2020-08-28 MED ORDER — PLENVU 140 G PO SOLR: 140.0000 g | ORAL | 0 refills | Status: DC

## 2020-08-28 NOTE — Progress Notes (Signed)
[facility] Consult Note:  History: Evan [name] 08/28/2020  Referring provider: Dettinger, Fransisca Kaufmann, MD  Reason for consult/chief complaint: Gastroesophageal Reflux (Takes Pepcid 20mg  bid, does not manage Sx well, burping with bad taste) and Colonoscopy (Had one scheduled but was Cx. Patient on Brillinta, Patient denies any Sx)   Subjective  HPI:  This is a pleasant 55 year old man referred by primary care for colon cancer screening.  He denies abdominal pain, altered bowel habits or rectal bleeding.  For years he has occasional belching that would lead to burning and feeling of food regurgitation.  He was originally on ranitidine, then changed to Pepcid.  He takes the medicine regularly but still has the intermittent episodes as described.  He denies dysphagia or odynophagia, nausea or vomiting. An extensive cardiology office note from February 16 was reviewed that outlines this patient's extensive cardiac history as well as some symptoms of dyspnea, irregular heartbeat and left-sided chest and arm pain.  That provider felt that the chest discomfort was chest wall pain due to his heavy physical activity.  Echocardiogram was reviewed with report as below.  (LVEF stable from his October 2020 exam) No prior colonoscopy or other colon cancer screening.  ROS:  Review of Systems  Constitutional: Negative for appetite change and unexpected weight change.  HENT: Negative for mouth sores and voice change.   Eyes: Negative for pain and redness.  Respiratory: Negative for cough and shortness of breath.   Cardiovascular: Negative for chest pain and palpitations.  Genitourinary: Negative for dysuria and hematuria.  Musculoskeletal: Negative for arthralgias and myalgias.       Chest wall and arm pain as described above  Skin: Negative for pallor and rash.  Neurological: Negative for weakness and headaches.  Hematological: Negative for adenopathy.     Past Medical  History: Past Medical History:  Diagnosis Date  . Arthritis   . Atrial fibrillation ([place])   . CHF (congestive heart failure) ([place])   . Diabetes mellitus without complication ([place])   . Gout   . Heart attack ([place]) 10/2018  . Hyperlipidemia   . Hypertension   . Morbid obesity ([place]) 12/22/2016     Past Surgical History: Past Surgical History:  Procedure Laterality Date  . ANKLE ARTHROSCOPY Right   . CORONARY STENT INTERVENTION N/A 11/24/2018   Procedure: CORONARY STENT INTERVENTION;  Surgeon: Wellington Hampshire, MD;  Location: [facility];  Service: Cardiovascular;  Laterality: N/A;  prox and mid LAD  . CORONARY STENT INTERVENTION N/A 11/26/2018   Procedure: CORONARY STENT INTERVENTION;  Surgeon: Wellington Hampshire, MD;  Location: [facility];  Service: Cardiovascular;  Laterality: N/A;  . CYST REMOVAL HAND    . KNEE ARTHROSCOPY Left   . LEFT HEART CATH AND CORONARY ANGIOGRAPHY N/A 11/24/2018   Procedure: LEFT HEART CATH AND CORONARY ANGIOGRAPHY;  Surgeon: Wellington Hampshire, MD;  Location: [facility];  Service: Cardiovascular;  Laterality: N/A;   Last coronary stents placed July 2020  Family History: Family History  Problem Relation Age of Onset  . Diabetes Mother   . Stroke Mother   . Early death Father   . Heart attack Father        died from heart attack at the age of 51  . Alcohol abuse Father   . Heart disease Father   . Breast cancer Sister   . Hypertension Brother   . Heart attack Maternal Grandfather  died from heart attack at the age of 55  . Early death Paternal Grandfather   . Heart attack Paternal Grandfather   . Colon cancer Neg Hx   . Esophageal cancer Neg Hx   . Rectal cancer Neg Hx   . Stomach cancer Neg Hx     Father and paternal grandfather both died in their 26s from MI in both smoked.  Social History: Social History   Socioeconomic History  . Marital status: Single    Spouse name: Not on file  . Number of children: Not on  file  . Years of education: Not on file  . Highest education level: Not on file  Occupational History  . Not on file  Tobacco Use  . Smoking status: Never Smoker  . Smokeless tobacco: Never Used  Vaping Use  . Vaping Use: Never used  Substance and Sexual Activity  . Alcohol use: Yes    Alcohol/week: 12.0 standard drinks    Types: 12 Cans of beer per week    Comment: 5 beers a week  . Drug use: No  . Sexual activity: Yes    Comment: male 8 months partner  Other Topics Concern  . Not on file  Social History Narrative  . Not on file   Social Determinants of Health   Financial Resource Strain: Not on file  Food Insecurity: Not on file  Transportation Needs: Not on file  Physical Activity: Not on file  Stress: Not on file  Social Connections: Not on file   On disability.  Sometimes works scrapping metal He has never been a smoker  Allergies: No Known Allergies  Outpatient Meds: Current Outpatient Medications  Medication Sig Dispense Refill  . aspirin EC 81 MG tablet Take 81 mg by mouth daily.    Marland Kitchen atorvastatin (LIPITOR) 80 MG tablet Take 1 tablet (80 mg total) by mouth daily at 6 PM. 90 tablet 3  . BD PEN NEEDLE NANO 2ND GEN 32G X 4 MM MISC Use daily with insulin Dx E11.9, Z79.4 100 each 3  . Blood Glucose Monitoring Suppl (ACCU-CHEK AVIVA) device Test BS BID Dx E11.69 1 each 0  . carvedilol (COREG) 12.5 MG tablet Take 1 tablet (12.5 mg total) by mouth 2 (two) times daily with a meal. 180 tablet 3  . Dulaglutide (TRULICITY) 1.5 EL/3.8BO SOPN Inject 1.5 mg into the skin once a week. 9 mL 3  . famotidine (PEPCID) 20 MG tablet Take 1 tablet (20 mg total) by mouth 2 (two) times daily. 180 tablet 3  . fenofibrate (TRICOR) 145 MG tablet Take 1 tablet (145 mg total) by mouth daily. 90 tablet 3  . fluticasone (FLONASE) 50 MCG/ACT nasal spray PLACE 1 SPRAY INTO BOTH NOSTRILS DAILY. 48 mL 1  . furosemide (LASIX) 20 MG tablet Take 1 tablet (20 mg total) by mouth daily. 90 tablet 3   . glucose blood (ACCU-CHEK AVIVA PLUS) test strip 1 each by Other route in the morning and at bedtime. Use as instructed 200 each 11  . JARDIANCE 25 MG TABS tablet Take 1 tablet (25 mg total) by mouth daily. 30 tablet 11  . losartan (COZAAR) 25 MG tablet Take 1 tablet (25 mg total) by mouth at bedtime. 90 tablet 3  . nitroGLYCERIN (NITROSTAT) 0.4 MG SL tablet Place 1 tablet (0.4 mg total) under the tongue every 5 (five) minutes x 3 doses as needed for chest pain. 60 tablet 0  . Omega-3 Fatty Acids (FISH OIL) 1000 MG CAPS Take  2,000 mg by mouth daily.    . potassium chloride (KLOR-CON) 10 MEQ tablet TAKE 1 TABLET BY MOUTH EVERY DAY 90 tablet 0  . spironolactone (ALDACTONE) 25 MG tablet TAKE 1/2 TABLET BY MOUTH EVERY DAY 45 tablet 3  . BRILINTA 90 MG TABS tablet TAKE 1 TABLET (90 MG TOTAL) BY MOUTH 2 (TWO) TIMES DAILY. 180 tablet 1   No current facility-administered medications for this visit.      ___________________________________________________________________ Objective   Exam:  BP 100/72   Pulse 78   Ht 6\' 4"  (1.93 m)   Wt 283 lb (128.4 kg)   SpO2 98%   BMI 34.45 kg/m  Wt Readings from Last 3 Encounters:  08/28/20 283 lb (128.4 kg)  08/14/20 288 lb 12.8 oz (131 kg)  07/04/20 293 lb 12.8 oz (133.3 kg)     General: Ambulatory, gets on exam table without difficulty, well-appearing today.  No dysphonia  Eyes: sclera anicteric, no redness  ENT: oral mucosa moist without lesions, no cervical or supraclavicular lymphadenopathy  CV: RRR without murmur, S1/S2, no JVD, no peripheral edema  Resp: clear to auscultation bilaterally, normal RR and effort noted  GI: soft, no tenderness, with active bowel sounds. No guarding or palpable organomegaly noted.  Skin; warm and dry, no rash or jaundice noted  Neuro: awake, alert and oriented x 3. Normal gross motor function and fluent speech  Labs:  CBC Latest Ref Rng & Units 08/14/2020 05/24/2020 01/12/2020  WBC 3.4 - 10.8 x10E3/uL  10.3 12.1(H) 9.4  Hemoglobin 13.0 - 17.7 g/dL 15.0 17.0 15.9  Hematocrit 37.5 - 51.0 % 44.7 49.8 47.2  Platelets 150 - 450 x10E3/uL 227 249 211   CMP Latest Ref Rng & Units 08/14/2020 05/24/2020 01/12/2020  Glucose 65 - 99 mg/dL 142(H) 289(H) 202(H)  BUN 6 - 24 mg/dL 14 14 15   Creatinine 0.76 - 1.27 mg/dL 1.14 1.11 1.31(H)  Sodium 134 - 144 mmol/L 142 139 140  Potassium 3.5 - 5.2 mmol/L 4.7 4.3 4.3  Chloride 96 - 106 mmol/L 105 99 100  CO2 20 - 29 mmol/L 21 25 26   Calcium 8.7 - 10.2 mg/dL 8.8 9.7 9.6  Total Protein 6.0 - 8.5 g/dL 7.0 7.4 7.6  Total Bilirubin 0.0 - 1.2 mg/dL 0.4 0.3 0.3  Alkaline Phos 44 - 121 IU/L 89 81 62  AST 0 - 40 IU/L 14 17 19   ALT 0 - 44 IU/L 18 20 21    Hemoglobin A1c 7.5 on March 29, 7.9 on January 6, and 7.1 last August  Radiologic Studies:  1. Hypokinesis of the septum, apical anterior and apical myocardium. Left  ventricular ejection fraction, by estimation, is 35 to 40%. The left  ventricle has moderately decreased function. The left ventricle  demonstrates regional wall motion  abnormalities (see scoring diagram/findings for description). There is  mild concentric left ventricular hypertrophy. Left ventricular diastolic  parameters are consistent with Grade II diastolic dysfunction  (pseudonormalization).   2. Right ventricular systolic function is normal. The right ventricular  size is normal.   3. Left atrial size was mildly dilated.   4. The mitral valve is normal in structure. Trivial mitral valve  regurgitation. No evidence of mitral stenosis.   5. The aortic valve is tricuspid. Aortic valve regurgitation is not  visualized. No aortic stenosis is present.   6. The inferior vena cava is dilated in size with >50% respiratory  variability, suggesting right atrial pressure of 8 mmHg.    Assessment: Encounter Diagnoses  Name Primary?  . Special screening for malignant neoplasms, colon Yes  . Gastroesophageal reflux disease, unspecified whether  esophagitis present   . CAD S/P PCI   . Ischemic cardiomyopathy     Average risk for colorectal cancer.  Colonoscopy recommended and was described in detail along with risks and benefits.  The benefits and risks of the planned procedure were described in detail with the patient or (when appropriate) their health care proxy.  Risks were outlined as including, but not limited to, bleeding, infection, perforation, adverse medication reaction leading to cardiac or pulmonary decompensation, pancreatitis (if ERCP).  The limitation of incomplete mucosal visualization was also discussed.  No guarantees or warranties were given.  He is at increased risk due to his cardiac condition, thus I feel would be safest for him to have it done in the hospital-based endoscopy lab at [facility]. He was agreeable, and we will also send a message to his cardiologist regarding a 5-day hold of Brilinta before the procedure. Riddik is aware of the small but real risk of cardiovascular event while he is briefly off the [place].  His last coronary intervention was in July 2020.  Regarding his reflux symptoms, he does not have red flag symptoms, and I think it is exacerbated by suboptimally controlled diabetes and his GLP-1 agonist medication causing some upper GI dysmotility.  It is common to have these type of breakthrough symptoms despite medication since all the medicine can do to suppress acid production. We discussed the limitation of acid suppression therapy, breadth of diet and lifestyle changes required for reflux control, the possibility of reflux related complications such as esophagitis, stricture or Barrett's esophagus.  Other anatomic considerations such as gastric outlet obstruction or hiatal hernia may contribute to reflux.    Thank you for the courtesy of this consult.  Please call me with any questions or concerns.  Nelida Meuse III  CC: Referring provider noted above

## 2020-08-28 NOTE — Telephone Encounter (Signed)
Pre-op request for colonoscopy, asking to hold antiplatelets 2 days prior to procedure.   H/o CAD with staged PCI 11/24/2018 and 11/26/2018. Received total DES x 3 to LAD and RPDA. H/o ICM EF 35-40%.  Last seen 07/04/20 and stable from had reproducible pain on exam, no anginal pain. Echo was ordered to evaluate EF. This showed no significant change, EF 35-40%, G2DD, mildly dilated LA, trivial MR.   Dr. Gwenlyn Found please comment on Aspirin and Brilinta.   Please route response to [facility] PREOP  Thanks!

## 2020-08-28 NOTE — Telephone Encounter (Signed)
[facility] Pre-operative Risk Assessment     Request for surgical clearance:     Endoscopy Procedure  What type of surgery is being performed?     Colonoscopy  When is this surgery scheduled?    09-18-2020  What type of clearance is required ?   Pharmacy  Are there any medications that need to be held prior to surgery and how long? Yes, 2 days  Practice name and name of physician performing surgery?      [facility]  What is your office phone and fax number?      Phone- [phone]  [phone]  Anesthesia type (None, local, MAC, general) ?       MAC

## 2020-08-28 NOTE — Patient Instructions (Addendum)
If you are age 55 or older, your body mass index should be between 23-30. Your Body mass index is 34.45 kg/m. If this is out of the aforementioned range listed, please consider follow up with your Primary Care Provider.  If you are age 64 or younger, your body mass index should be between 19-25. Your Body mass index is 34.45 kg/m. If this is out of the aformentioned range listed, please consider follow up with your Primary Care Provider.   You have been scheduled for a colonoscopy. Please follow written instructions given to you at your visit today.  Please pick up your prep supplies at the pharmacy within the next 1-3 days. If you use inhalers (even only as needed), please bring them with you on the day of your procedure.  Due to recent changes in healthcare laws, you may see the results of your imaging and laboratory studies on MyChart before your provider has had a chance to review them.  We understand that in some cases there may be results that are confusing or concerning to you. Not all laboratory results come back in the same time frame and the provider may be waiting for multiple results in order to interpret others.  Please give [place] 48 hours in order for your provider to thoroughly review all the results before contacting the office for clarification of your results.   You will be contacted by our office prior to your procedure for directions on holding your Brilinta .  If you do not hear from our office 1 week prior to your scheduled procedure, please call [phone] to discuss.   Marland Kitchenlecdm  It was a pleasure to see you today!  Dr. Loletha Carrow

## 2020-09-01 NOTE — Telephone Encounter (Signed)
OK to hold anti platelet agents for colonoscopy

## 2020-09-02 ENCOUNTER — Other Ambulatory Visit: Payer: Self-pay | Admitting: Cardiovascular Disease

## 2020-09-02 ENCOUNTER — Other Ambulatory Visit: Payer: Self-pay | Admitting: Family Medicine

## 2020-09-03 NOTE — Telephone Encounter (Signed)
Rx has been sent to the pharmacy electronically. ° °

## 2020-09-07 NOTE — Telephone Encounter (Signed)
Pt has been notified and aware of the results. He understands to hold the Brilinta for 2 days before

## 2020-09-13 ENCOUNTER — Telehealth: Payer: Self-pay | Admitting: Cardiovascular Disease

## 2020-09-13 NOTE — Telephone Encounter (Signed)
Spoke with pt regarding clearance for colonoscopy. Appears that clearance was put in 08/28/20. Per clearance pt will need to hold brilinta for 48 hours prior to procedure, relayed this to the pt. All questions answered. Pt verbalizes understanding. Pt would like to cancel appointment for 5/3 because colonoscopy is on the same day. Pt asking if he should remake office visit, per most recent office note by [name], NP pt does need to follow up with himself or Dr. Gwenlyn Found.  Will send to scheduling to re-make office visit.

## 2020-09-13 NOTE — Telephone Encounter (Signed)
Patient is calling to see if we received medical clearance forms from [facility] for a colonoscopy

## 2020-09-14 ENCOUNTER — Other Ambulatory Visit: Payer: Self-pay

## 2020-09-14 ENCOUNTER — Other Ambulatory Visit (HOSPITAL_COMMUNITY)
Admission: RE | Admit: 2020-09-14 | Discharge: 2020-09-14 | Disposition: A | Discharge status: Home / Self Care | Payer: Medicaid Other | Source: Ambulatory Visit | Attending: Gastroenterology | Admitting: Gastroenterology

## 2020-09-14 ENCOUNTER — Encounter (HOSPITAL_COMMUNITY): Payer: Self-pay | Admitting: Gastroenterology

## 2020-09-14 DIAGNOSIS — Z20822 Contact with and (suspected) exposure to covid-19: Secondary | ICD-10-CM | POA: Insufficient documentation

## 2020-09-14 DIAGNOSIS — Z01812 Encounter for preprocedural laboratory examination: Secondary | ICD-10-CM | POA: Diagnosis not present

## 2020-09-14 LAB — SARS CORONAVIRUS 2 (TAT 6-24 HRS): SARS Coronavirus 2: NEGATIVE

## 2020-09-18 ENCOUNTER — Ambulatory Visit (HOSPITAL_COMMUNITY)
Admission: RE | Admit: 2020-09-18 | Discharge: 2020-09-18 | Disposition: A | Discharge status: Home / Self Care | Payer: Medicaid Other | Attending: Gastroenterology | Admitting: Gastroenterology

## 2020-09-18 ENCOUNTER — Other Ambulatory Visit: Payer: Self-pay

## 2020-09-18 ENCOUNTER — Ambulatory Visit (HOSPITAL_COMMUNITY): Payer: Medicaid Other | Admitting: Anesthesiology

## 2020-09-18 ENCOUNTER — Encounter (HOSPITAL_COMMUNITY): Payer: Self-pay | Admitting: Gastroenterology

## 2020-09-18 ENCOUNTER — Ambulatory Visit: Payer: Medicaid Other | Admitting: Cardiovascular Disease

## 2020-09-18 ENCOUNTER — Encounter (HOSPITAL_COMMUNITY)
Admission: RE | Disposition: A | Discharge status: Home / Self Care | Payer: Self-pay | Source: Home / Self Care | Attending: Gastroenterology

## 2020-09-18 DIAGNOSIS — D120 Benign neoplasm of cecum: Secondary | ICD-10-CM

## 2020-09-18 DIAGNOSIS — D123 Benign neoplasm of transverse colon: Secondary | ICD-10-CM

## 2020-09-18 DIAGNOSIS — D122 Benign neoplasm of ascending colon: Secondary | ICD-10-CM | POA: Diagnosis not present

## 2020-09-18 DIAGNOSIS — Z955 Presence of coronary angioplasty implant and graft: Secondary | ICD-10-CM | POA: Insufficient documentation

## 2020-09-18 DIAGNOSIS — I509 Heart failure, unspecified: Secondary | ICD-10-CM | POA: Diagnosis not present

## 2020-09-18 DIAGNOSIS — I110 Hypertensive heart disease with heart failure: Secondary | ICD-10-CM | POA: Diagnosis not present

## 2020-09-18 DIAGNOSIS — D125 Benign neoplasm of sigmoid colon: Secondary | ICD-10-CM | POA: Insufficient documentation

## 2020-09-18 DIAGNOSIS — Z1211 Encounter for screening for malignant neoplasm of colon: Secondary | ICD-10-CM | POA: Diagnosis not present

## 2020-09-18 DIAGNOSIS — K635 Polyp of colon: Secondary | ICD-10-CM | POA: Diagnosis not present

## 2020-09-18 HISTORY — PX: POLYPECTOMY: SHX5525

## 2020-09-18 HISTORY — PX: COLONOSCOPY WITH PROPOFOL: SHX5780

## 2020-09-18 HISTORY — DX: Sleep apnea, unspecified: G47.30

## 2020-09-18 LAB — GLUCOSE, CAPILLARY: Glucose-Capillary: 122 mg/dL — ABNORMAL HIGH (ref 70–99)

## 2020-09-18 SURGERY — COLONOSCOPY WITH PROPOFOL
Anesthesia: Monitor Anesthesia Care

## 2020-09-18 MED ORDER — PROPOFOL 500 MG/50ML IV EMUL
INTRAVENOUS | Status: AC
  Filled 2020-09-18: qty 50

## 2020-09-18 MED ORDER — PROPOFOL 10 MG/ML IV BOLUS
INTRAVENOUS | Status: DC | PRN
  Administered 2020-09-18: 30 mg via INTRAVENOUS
  Administered 2020-09-18: 40 mg via INTRAVENOUS
  Administered 2020-09-18: 30 mg via INTRAVENOUS
  Administered 2020-09-18 (×2): 40 mg via INTRAVENOUS

## 2020-09-18 MED ORDER — LACTATED RINGERS IV SOLN: INTRAVENOUS | Status: DC | PRN

## 2020-09-18 MED ORDER — LACTATED RINGERS IV SOLN: INTRAVENOUS | Status: DC

## 2020-09-18 MED ORDER — PROPOFOL 10 MG/ML IV BOLUS
INTRAVENOUS | Status: AC
  Filled 2020-09-18: qty 20

## 2020-09-18 MED ORDER — PROPOFOL 500 MG/50ML IV EMUL
INTRAVENOUS | Status: DC | PRN
  Administered 2020-09-18: 150 ug/kg/min via INTRAVENOUS

## 2020-09-18 MED ORDER — MIDAZOLAM HCL 2 MG/2ML IJ SOLN
INTRAMUSCULAR | Status: AC
  Filled 2020-09-18: qty 2

## 2020-09-18 MED ORDER — FENTANYL CITRATE (PF) 100 MCG/2ML IJ SOLN
INTRAMUSCULAR | Status: AC
  Filled 2020-09-18: qty 2

## 2020-09-18 MED ORDER — PHENYLEPHRINE 40 MCG/ML (10ML) SYRINGE FOR IV PUSH (FOR BLOOD PRESSURE SUPPORT)
PREFILLED_SYRINGE | INTRAVENOUS | Status: DC | PRN
  Administered 2020-09-18: 120 ug via INTRAVENOUS
  Administered 2020-09-18: 40 ug via INTRAVENOUS
  Administered 2020-09-18: 120 ug via INTRAVENOUS
  Administered 2020-09-18: 80 ug via INTRAVENOUS
  Administered 2020-09-18 (×2): 120 ug via INTRAVENOUS

## 2020-09-18 SURGICAL SUPPLY — 21 items

## 2020-09-18 NOTE — Transfer of Care (Signed)
Immediate Anesthesia Transfer of Care Note  Patient: Evan [name]  Procedure(s) Performed: COLONOSCOPY WITH PROPOFOL (N/A ) POLYPECTOMY  Patient Location: PACU and Endoscopy Unit  Anesthesia Type:MAC  Level of Consciousness: awake, alert  and oriented  Airway & Oxygen Therapy: Patient Spontanous Breathing and Patient connected to face mask  Post-op Assessment: VSS  Post vital signs: Reviewed and stable  Last Vitals:  Vitals Value Taken Time  BP 121/80 09/18/20 1114  Temp 36.6 C 09/18/20 1114  Pulse 78 09/18/20 1117  Resp 18 09/18/20 1117  SpO2 98 % 09/18/20 1117  Vitals shown include unvalidated device data.  Last Pain:  Vitals:   09/18/20 1114  TempSrc: Oral  PainSc: 0-No pain         Complications: No complications documented.

## 2020-09-18 NOTE — Discharge Instructions (Signed)
YOU HAD AN ENDOSCOPIC PROCEDURE TODAY: Refer to the procedure report and other information in the discharge instructions given to you for any specific questions about what was found during the examination. If this information does not answer your questions, please call [place] office at [phone] to clarify.   YOU SHOULD EXPECT: Some feelings of bloating in the abdomen. Passage of more gas than usual. Walking can help get rid of the air that was put into your GI tract during the procedure and reduce the bloating. If you had a lower endoscopy (such as a colonoscopy or flexible sigmoidoscopy) you may notice spotting of blood in your stool or on the toilet paper. Some abdominal soreness may be present for a day or two, also.  DIET: Your first meal following the procedure should be a light meal and then it is ok to progress to your normal diet. A half-sandwich or bowl of soup is an example of a good first meal. Heavy or fried foods are harder to digest and may make you feel nauseous or bloated. Drink plenty of fluids but you should avoid alcoholic beverages for 24 hours. If you had a esophageal dilation, please see attached instructions for diet.    ACTIVITY: Your care partner should take you home directly after the procedure. You should plan to take it easy, moving slowly for the rest of the day. You can resume normal activity the day after the procedure however YOU SHOULD NOT DRIVE, use power tools, machinery or perform tasks that involve climbing or major physical exertion for 24 hours (because of the sedation medicines used during the test).   SYMPTOMS TO REPORT IMMEDIATELY: A gastroenterologist can be reached at any hour. Please call [phone]  for any of the following symptoms:  . Following lower endoscopy (colonoscopy, flexible sigmoidoscopy) Excessive amounts of blood in the stool  Significant tenderness, worsening of abdominal pains  Swelling of the abdomen that is new, acute  Fever of 100  or higher  YOU HAD AN ENDOSCOPIC PROCEDURE TODAY: Refer to the procedure report and other information in the discharge instructions given to you for any specific questions about what was found during the examination. If this information does not answer your questions, please call [place] office at [phone] to clarify.   YOU SHOULD EXPECT: Some feelings of bloating in the abdomen. Passage of more gas than usual. Walking can help get rid of the air that was put into your GI tract during the procedure and reduce the bloating. If you had a lower endoscopy (such as a colonoscopy or flexible sigmoidoscopy) you may notice spotting of blood in your stool or on the toilet paper. Some abdominal soreness may be present for a day or two, also.  DIET: Your first meal following the procedure should be a light meal and then it is ok to progress to your normal diet. A half-sandwich or bowl of soup is an example of a good first meal. Heavy or fried foods are harder to digest and may make you feel nauseous or bloated. Drink plenty of fluids but you should avoid alcoholic beverages for 24 hours. If you had a esophageal dilation, please see attached instructions for diet.    ACTIVITY: Your care partner should take you home directly after the procedure. You should plan to take it easy, moving slowly for the rest of the day. You can resume normal activity the day after the procedure however YOU SHOULD NOT DRIVE, use power tools, machinery or perform tasks that involve  climbing or major physical exertion for 24 hours (because of the sedation medicines used during the test).   SYMPTOMS TO REPORT IMMEDIATELY: A gastroenterologist can be reached at any hour. Please call [phone]  for any of the following symptoms:  . Following lower endoscopy (colonoscopy, flexible sigmoidoscopy) Excessive amounts of blood in the stool  Significant tenderness, worsening of abdominal pains  Swelling of the abdomen that is new, acute   Fever of 100 or higher  . Following upper endoscopy (EGD, EUS, ERCP, esophageal dilation) Vomiting of blood or coffee ground material  New, significant abdominal pain  New, significant chest pain or pain under the shoulder blades  Painful or persistently difficult swallowing  New shortness of breath  Black, tarry-looking or red, bloody stools  FOLLOW UP:  If any biopsies were taken you will be contacted by phone or by letter within the next 1-3 weeks. Call [phone]  if you have not heard about the biopsies in 3 weeks.  Please also call with any specific questions about appointments or follow up tests. FOLLOW UP:  If any biopsies were taken you will be contacted by phone or by letter within the next 1-3 weeks. Call [phone]  if you have not heard about the biopsies in 3 weeks.  Please also call with any specific questions about appointments or follow up tests.RESUME YOUR BRILINTA TOMORROW EVENING  ______________________________________________________

## 2020-09-18 NOTE — Interval H&P Note (Signed)
History and Physical Interval Note:  09/18/2020 10:14 AM  Evan [name]  has presented today for surgery, with the diagnosis of Screening colonoscopy.  The various methods of treatment have been discussed with the patient and family. After consideration of risks, benefits and other options for treatment, the patient has consented to  Procedure(s) with comments: COLONOSCOPY WITH PROPOFOL (N/A) - 09-18-2020 as a surgical intervention.  The patient's history has been reviewed, patient examined, no change in status, stable for surgery.  I have reviewed the patient's chart and labs.  Questions were answered to the patient's satisfaction.     Nelida Meuse III

## 2020-09-18 NOTE — Anesthesia Preprocedure Evaluation (Addendum)
Anesthesia Evaluation  Patient identified by MRN, date of birth, ID band Patient awake    Reviewed: Allergy & Precautions, NPO status , Patient's Chart, lab work & pertinent test results, reviewed documented beta blocker date and time   Airway Mallampati: I  TM Distance: >3 FB Neck ROM: Full    Dental no notable dental hx. (+) Dental Advisory Given, Teeth Intact   Pulmonary sleep apnea (does not use CPAP) ,    Pulmonary exam normal breath sounds clear to auscultation       Cardiovascular hypertension, Pt. on home beta blockers and Pt. on medications + CAD, + Past MI, + Cardiac Stents (2020) and +CHF  Normal cardiovascular exam+ dysrhythmias Atrial Fibrillation  Rhythm:Regular Rate:Normal  TTE 2022 EF 35-40%, G2DD, valves ok  LHC 2020 Previously placed Ost LAD to Prox LAD drug eluting stent is widely patent. Balloon angioplasty was performed. Previously placed Mid LAD-2 drug eluting stent is widely patent. Balloon angioplasty was performed. Mid LAD-1 lesion is 30% stenosed. Dist Cx lesion is 99% stenosed. Prox RCA to Mid RCA lesion is 95% stenosed. Dist RCA lesion is 30% stenosed. RPAV lesion is 100% stenosed. Post intervention, there is a 0% residual stenosis. A drug-eluting stent was successfully placed using a STENT RESOLUTE ONYX [id number].   1.  Widely patent LAD stents with unchanged coronary anatomy. 2.  LVEDP was 13 mmHg with mild hypotension.  The patient was given 250 normal saline bolus. 3.  Successful angioplasty and drug-eluting stent placement to the proximal/mid right coronary artery.    Neuro/Psych negative neurological ROS  negative psych ROS   GI/Hepatic Neg liver ROS, GERD  ,  Endo/Other  negative endocrine ROSdiabetes, Type 2, Insulin Dependent  Renal/GU negative Renal ROS  negative genitourinary   Musculoskeletal   Abdominal   Peds  Hematology  (+) Blood dyscrasia (on brilinta), ,    Anesthesia Other Findings Screening colonoscopy  Reproductive/Obstetrics                            Anesthesia Physical Anesthesia Plan  ASA: III  Anesthesia Plan: MAC   Post-op Pain Management:    Induction: Intravenous  PONV Risk Score and Plan: Propofol infusion and Treatment may vary due to age or medical condition  Airway Management Planned: Natural Airway  Additional Equipment:   Intra-op Plan:   Post-operative Plan:   Informed Consent: I have reviewed the patients History and Physical, chart, labs and discussed the procedure including the risks, benefits and alternatives for the proposed anesthesia with the patient or authorized representative who has indicated his/her understanding and acceptance.     Dental advisory given  Plan Discussed with: CRNA  Anesthesia Plan Comments:         Anesthesia Quick Evaluation

## 2020-09-18 NOTE — Op Note (Signed)
[facility] Patient Name: Evan [name] Procedure Date: 09/18/2020 MRN: [id number] Attending MD: Estill Cotta. Loletha Carrow , MD Date of Birth: 1965/09/17 CSN: [id number] Age: 55 Admit Type: Outpatient Procedure:                Colonoscopy Indications:              Screening for colorectal malignant neoplasm, This                            is the patient's first colonoscopy Providers:                Mallie Mussel L. Loletha Carrow, MD, Burtis Junes, RN, Tyna Jaksch                            Technician Referring MD:             Fransisca Kaufmann. Dettinger, MD Medicines:                Monitored Anesthesia Care Complications:            No immediate complications. Estimated Blood Loss:     Estimated blood loss was minimal. Procedure:                Pre-Anesthesia Assessment:                           - Prior to the procedure, a History and Physical                            was performed, and patient medications and                            allergies were reviewed. The patient's tolerance of                            previous anesthesia was also reviewed. The risks                            and benefits of the procedure and the sedation                            options and risks were discussed with the patient.                            All questions were answered, and informed consent                            was obtained. Prior Anticoagulants: The patient has                            taken Brillinta, last dose was 5 days prior to                            procedure. ASA Grade Assessment: IV - A patient  with severe systemic disease that is a constant                            threat to life. After reviewing the risks and                            benefits, the patient was deemed in satisfactory                            condition to undergo the procedure.                           After obtaining informed consent, the colonoscope                            was  passed under direct vision. Throughout the                            procedure, the patient's blood pressure, pulse, and                            oxygen saturations were monitored continuously. The                            [id number] ([id number]) Olympus colonoscope was                            introduced through the anus and advanced to the the                            cecum, identified by appendiceal orifice and                            ileocecal valve. The colonoscopy was performed                            without difficulty. The patient tolerated the                            procedure well. The quality of the bowel                            preparation was excellent. The ileocecal valve,                            appendiceal orifice, and rectum were photographed. Scope In: 10:34:12 AM Scope Out: 11:06:50 AM Scope Withdrawal Time: 0 hours 27 minutes 46 seconds  Total Procedure Duration: 0 hours 32 minutes 38 seconds  Findings:      The perianal and digital rectal examinations were normal.      Four sessile polyps were found in the ascending colon and cecum. The       polyps were 2 to 5 mm in size. These polyps were removed with a cold       snare. Resection and retrieval  were complete.      Three sessile polyps were found in the transverse colon. The polyps were       3 to 6 mm in size. These polyps were removed with a cold snare.       Resection and retrieval were complete.      Three sessile polyps were found in the proximal sigmoid colon and       transverse colon. The polyps were 3 to 4 mm in size. These polyps were       removed with a cold snare. Resection and retrieval were complete.      The exam was otherwise without abnormality on direct and retroflexion       views. Impression:               - Four 2 to 5 mm polyps in the ascending colon and                            in the cecum, removed with a cold snare. Resected                            and retrieved.                            - Three 3 to 6 mm polyps in the transverse colon,                            removed with a cold snare. Resected and retrieved.                           - Three 3 to 4 mm polyps in the proximal sigmoid                            colon and in the transverse colon, removed with a                            cold snare. Resected and retrieved.                           - The examination was otherwise normal on direct                            and retroflexion views. Moderate Sedation:      MAC sedation used Recommendation:           - Patient has a contact number available for                            emergencies. The signs and symptoms of potential                            delayed complications were discussed with the                            patient. Return to normal activities tomorrow.  Written discharge instructions were provided to the                            patient.                           - Resume previous diet.                           - Continue present medications.                           - Await pathology results.                           - Resume Brillinta at prior dose tomorrow.                           - Await pathology results.                           - Repeat colonoscopy is recommended for                            surveillance. The colonoscopy date will be                            determined after pathology results from today's                            exam become available for review. Procedure Code(s):        --- Professional ---                           [phone], Colonoscopy, flexible; with removal of                            tumor(s), polyp(s), or other lesion(s) by snare                            technique Diagnosis Code(s):        --- Professional ---                           Z12.11, Encounter for screening for malignant                            neoplasm of colon                            K63.5, Polyp of colon CPT copyright 2019 American Medical Association. All rights reserved. The codes documented in this report are preliminary and upon coder review may  be revised to meet current compliance requirements. Teryn Boerema L. Loletha Carrow, MD 09/18/2020 11:15:04 AM This report has been signed electronically. Number of Addenda: 0

## 2020-09-18 NOTE — Anesthesia Postprocedure Evaluation (Signed)
Anesthesia Post Note  Patient: Evan [name]  Procedure(s) Performed: COLONOSCOPY WITH PROPOFOL (N/A ) POLYPECTOMY     Patient location during evaluation: Endoscopy Anesthesia Type: MAC Level of consciousness: awake and alert Pain management: pain level controlled Vital Signs Assessment: post-procedure vital signs reviewed and stable Respiratory status: spontaneous breathing, nonlabored ventilation, respiratory function stable and patient connected to nasal cannula oxygen Cardiovascular status: blood pressure returned to baseline and stable Postop Assessment: no apparent nausea or vomiting Anesthetic complications: no   No complications documented.  Last Vitals:  Vitals:   09/18/20 1130 09/18/20 1140  BP: 123/72 (!) 141/91  Pulse: 67 60  Resp: (!) 21 18  Temp:    SpO2: 100% 100%    Last Pain:  Vitals:   09/18/20 1140  TempSrc:   PainSc: 0-No pain                 Brixton Franko L Jurnie Garritano

## 2020-09-18 NOTE — H&P (Signed)
  History:  This patient presents for endoscopic testing for colon cancer screening. (see office note from 08/28/20 for clinical details)  [name] Referring physician: Dettinger, Fransisca Kaufmann, MD  Past Medical History: Past Medical History:  Diagnosis Date  . Arthritis   . Atrial fibrillation ([place])   . CHF (congestive heart failure) ([place])   . Diabetes mellitus without complication ([place])   . Gout   . Heart attack ([place]) 10/2018  . Hyperlipidemia   . Hypertension   . Morbid obesity ([place]) 12/22/2016  . Sleep apnea    had test twice unable to complete     Past Surgical History: Past Surgical History:  Procedure Laterality Date  . ANKLE ARTHROSCOPY Right   . CORONARY STENT INTERVENTION N/A 11/24/2018   Procedure: CORONARY STENT INTERVENTION;  Surgeon: Wellington Hampshire, MD;  Location: [place] [facility];  Service: Cardiovascular;  Laterality: N/A;  prox and mid LAD  . CORONARY STENT INTERVENTION N/A 11/26/2018   Procedure: CORONARY STENT INTERVENTION;  Surgeon: Wellington Hampshire, MD;  Location: [place] [facility];  Service: Cardiovascular;  Laterality: N/A;  . CYST REMOVAL HAND    . KNEE ARTHROSCOPY Left   . LEFT HEART CATH AND CORONARY ANGIOGRAPHY N/A 11/24/2018   Procedure: LEFT HEART CATH AND CORONARY ANGIOGRAPHY;  Surgeon: Wellington Hampshire, MD;  Location: [facility] [place] [facility];  Service: Cardiovascular;  Laterality: N/A;    Allergies: No Known Allergies  Outpatient Meds: Current Facility-Administered Medications  Medication Dose Route Frequency Provider Last Rate Last Admin  . lactated ringers infusion   Intravenous Continuous Nelida Meuse III, MD 10 mL/hr at 09/18/20 1003 New Bag at 09/18/20 1003      ___________________________________________________________________ Objective   Exam:  BP 127/83   Temp 98.8 F (37.1 C) (Oral)   Resp 13   Ht 6' 4.5" (1.943 m)   Wt 127.5 kg   SpO2 100%   BMI 33.76 kg/m    CV: RRR without murmur, S1/S2, no JVD, no peripheral  edema  Resp: clear to auscultation bilaterally, normal RR and effort noted  GI: soft, no tenderness, with active bowel sounds. No guarding or palpable organomegaly noted.  Neuro: awake, alert and oriented x 3. Normal gross motor function and fluent speech   Assessment:  Average risk for CRC  Plan:  colonoscopy   Nelida Meuse III

## 2020-09-18 NOTE — Interval H&P Note (Signed)
History and Physical Interval Note:  09/18/2020 10:14 AM  Evan [name]  has presented today for surgery, with the diagnosis of Screening colonoscopy.  The various methods of treatment have been discussed with the patient and family. After consideration of risks, benefits and other options for treatment, the patient has consented to  Procedure(s) with comments: COLONOSCOPY WITH PROPOFOL (N/A) - 09-18-2020 as a surgical intervention.  The patient's history has been reviewed, patient examined, no change in status, stable for surgery.  I have reviewed the patient's chart and labs.  Questions were answered to the patient's satisfaction.     [name]   

## 2020-09-19 ENCOUNTER — Encounter (HOSPITAL_COMMUNITY): Payer: Self-pay | Admitting: Gastroenterology

## 2020-09-19 LAB — SURGICAL PATHOLOGY

## 2020-09-21 ENCOUNTER — Ambulatory Visit: Payer: Medicaid Other | Admitting: Cardiovascular Disease

## 2020-09-24 ENCOUNTER — Encounter: Payer: Self-pay | Admitting: Gastroenterology

## 2020-10-10 ENCOUNTER — Other Ambulatory Visit: Payer: Self-pay | Admitting: Cardiovascular Disease

## 2020-10-12 ENCOUNTER — Telehealth: Payer: Self-pay | Admitting: Family Medicine

## 2020-10-12 DIAGNOSIS — J329 Chronic sinusitis, unspecified: Secondary | ICD-10-CM

## 2020-10-12 NOTE — Telephone Encounter (Signed)
Trouble breathing - feels like something up his nose Nasal congestion - using flonase Right ear pain - ongoing  Congestion every morning.   Has spoke with Dettinger about these issues in past - would like ENT referral   Does have appt here soon - can wait until then if NTBS here first.

## 2020-10-17 NOTE — Telephone Encounter (Signed)
Placed referral to ENT

## 2020-10-31 NOTE — Progress Notes (Deleted)
Cardiology Clinic Note   Patient Name: Evan [name] Date of Encounter: 10/31/2020  Primary Care Provider:  Dettinger, Fransisca Kaufmann, MD Primary Cardiologist:  Quay Burow, MD  Patient Profile    Evan [name] 55 year old male presents to the clinic today for a follow up evaluation of his irregular heartbeat.  Past Medical History    Past Medical History:  Diagnosis Date   Arthritis    Atrial fibrillation (HCC)    CHF (congestive heart failure) ([place])    Diabetes mellitus without complication ([place])    Gout    Heart attack ([place]) 10/2018   Hyperlipidemia    Hypertension    Morbid obesity ([place]) 12/22/2016   Sleep apnea    had test twice unable to complete   Past Surgical History:  Procedure Laterality Date   ANKLE ARTHROSCOPY Right    COLONOSCOPY WITH PROPOFOL N/A 09/18/2020   Procedure: COLONOSCOPY WITH PROPOFOL;  Surgeon: Doran Stabler, MD;  Location: [facility];  Service: Gastroenterology;  Laterality: N/A;  09-18-2020   CORONARY STENT INTERVENTION N/A 11/24/2018   Procedure: CORONARY STENT INTERVENTION;  Surgeon: Wellington Hampshire, MD;  Location: [place] [facility];  Service: Cardiovascular;  Laterality: N/A;  prox and mid LAD   CORONARY STENT INTERVENTION N/A 11/26/2018   Procedure: CORONARY STENT INTERVENTION;  Surgeon: Wellington Hampshire, MD;  Location: [place] [facility];  Service: Cardiovascular;  Laterality: N/A;   CYST REMOVAL HAND     KNEE ARTHROSCOPY Left    LEFT HEART CATH AND CORONARY ANGIOGRAPHY N/A 11/24/2018   Procedure: LEFT HEART CATH AND CORONARY ANGIOGRAPHY;  Surgeon: Wellington Hampshire, MD;  Location: [place] [facility];  Service: Cardiovascular;  Laterality: N/A;   POLYPECTOMY  09/18/2020   Procedure: POLYPECTOMY;  Surgeon: Doran Stabler, MD;  Location: [facility];  Service: Gastroenterology;;    Allergies  No Known Allergies  History of Present Illness    Evan [name] past medical history includes hypertension, hyperlipidemia, coronary artery  disease, obesity and ischemic cardiomyopathy.  He was a delayed presentation of anterolateral myocardial infarction 11/24/2018.  He underwent cardiac catheterization by Dr. Fletcher Anon.  On 11/24/2018 he received PCI with DES to his LAD x2.  His cardiac catheterization showed proximal-mid RCA 95% stenosis, distal RCA 30% stenosis, 100% RP AV stenosis, distal circumflex 99% stenosis, ostial LAD to proximal LAD 100% stenosis, mid LAD 130% stenosis, mid LAD 200% stenosis, EF 25%.  His PCI was staged and on 11/26/2018 he received DES x1 to his right posterior atrioventricular artery.  Echocardiogram showed an EF of 20-25%.  He was discharged with a LifeVest.   He was seen by Dr. Gwenlyn Found in follow-up.  During that time he was mowing lawns, working out, and walking frequently without limitations.  His echocardiogram showed an improvement from his LVEF to 35--40%.  His LifeVest was discontinued.  He was following a low-sodium diet and his lipid panel 07/14/2019 showed an LDL of 72.   He was again seen by Dr. Gwenlyn Found on 09/23/2019.  He continued to do well.  He reported continued physical activity and no symptoms.  He had gained 25 pounds which he attributed to working out and muscle weight.   He contacted nurse triage line on 07/03/2020 and indicated that he was having left/right hand/arm tingling, pain in the back of his neck, and intermittent sharp pain in the left side of his chest.  He was also experiencing irregular heartbeat and worsening shortness of breath.  He  denied lightheadedness and syncope.  He reported that he had presented to his PCP who told him to see his chiropractor regarding his neck pain.  He reported that his chest pain had been intermittent and that he was not having any current chest pain.  He denied lower extremity swelling but felt he did have some abdominal swelling.  He reported compliance with his Lasix and spironolactone.   He presented to the clinic 07/04/20 for evaluation and stated he had gained  several pounds and was not exercising as much. 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 However, over the last couple weeks he had noticed increased work of breathing. When asked about chest discomfort he reported  intermittent episodes of left-sided chest discomfort. It was reproducible with deep palpation during exam. He also reported neck pain and arm pain which appeared to be related to nerve impingement. I  asked him to rest the area, use heat, and range of motion activities. I ordered an echocardiogram to reassess his EF, gave him salty 6 diet sheet, had him increase his physical activity as tolerated and planned follow-up after his exam.  Who presents to the clinic today for follow-up evaluation states***   Today he denies chest pain, increased shortness of breath, lower extremity edema, fatigue, palpitations, melena, hematuria, hemoptysis, diaphoresis, weakness, presyncope, syncope, orthopnea, and PND.  Home Medications    Prior to Admission medications   Medication Sig Start Date End Date Taking? Authorizing Provider  aspirin EC 81 MG tablet Take 81 mg by mouth daily.    [provider]  atorvastatin (LIPITOR) 80 MG tablet Take 1 tablet (80 mg total) by mouth daily at 6 PM. 05/24/20   Dettinger, Fransisca Kaufmann, MD  BD PEN NEEDLE NANO 2ND GEN 32G X 4 MM MISC Use daily with insulin Dx E11.9, Z79.4 06/22/20   Dettinger, Fransisca Kaufmann, MD  Blood Glucose Monitoring Suppl (ACCU-CHEK AVIVA) device Test BS BID Dx E11.69 05/24/20   Dettinger, Fransisca Kaufmann, MD  carvedilol (COREG) 12.5 MG tablet Take 1 tablet (12.5 mg total) by mouth 2 (two) times daily with a meal. 05/24/20   Dettinger, Fransisca Kaufmann, MD  Dulaglutide (TRULICITY) 1.5 EV/0.3JK SOPN Inject 1.5 mg into the skin once a week. Patient taking differently: Inject 1.5 mg into the skin every Monday. 05/30/20   Dettinger, Fransisca Kaufmann, MD  famotidine (PEPCID) 20 MG tablet Take 1 tablet (20 mg total) by  mouth 2 (two) times daily. 05/24/20   Dettinger, Fransisca Kaufmann, MD  fenofibrate (TRICOR) 145 MG tablet Take 1 tablet (145 mg total) by mouth daily. 08/14/20   Dettinger, Fransisca Kaufmann, MD  fluticasone (FLONASE) 50 MCG/ACT nasal spray PLACE 1 SPRAY INTO BOTH NOSTRILS DAILY. 09/03/20   Dettinger, Fransisca Kaufmann, MD  furosemide (LASIX) 20 MG tablet Take 1 tablet (20 mg total) by mouth daily. 08/14/20   Dettinger, Fransisca Kaufmann, MD  glucose blood (ACCU-CHEK AVIVA PLUS) test strip 1 each by Other route in the morning and at bedtime. Use as instructed 06/22/20   Dettinger, Fransisca Kaufmann, MD  JARDIANCE 25 MG TABS tablet Take 1 tablet (25 mg total) by mouth daily. 08/14/20   Dettinger, Fransisca Kaufmann, MD  losartan (COZAAR) 25 MG tablet Take 1 tablet (25 mg total) by mouth at bedtime. 05/24/20   Dettinger, Fransisca Kaufmann, MD  nitroGLYCERIN (NITROSTAT) 0.4 MG SL tablet Place 1 tablet (0.4 mg total) under the tongue every 5 (five) minutes x 3 doses  as needed for chest pain. 11/28/18   Mikhail, Velta Addison, DO  Omega-3 Fatty Acids (FISH OIL) 1000 MG CAPS Take 2,000 mg by mouth daily.    [provider]  PEG-KCl-NaCl-NaSulf-Na Asc-C (PLENVU) 140 g SOLR Take 140 g by mouth as directed. 08/28/20   Nelida Meuse III, MD  potassium chloride (KLOR-CON) 10 MEQ tablet TAKE 1 TABLET BY MOUTH EVERY DAY Patient taking differently: Take 10 mEq by mouth daily. 09/03/20   Dettinger, Fransisca Kaufmann, MD  spironolactone (ALDACTONE) 25 MG tablet TAKE 1/2 TABLET BY MOUTH EVERY DAY Patient taking differently: Take 12.5 mg by mouth daily. 09/03/20   Lorretta Harp, MD  ticagrelor (BRILINTA) 90 MG TABS tablet Take 1 tablet (90 mg total) by mouth 2 (two) times daily. 10/10/20 10/10/21  Deberah Pelton, NP    Family History    Family History  Problem Relation Age of Onset   Diabetes Mother    Stroke Mother    Early death Father    Heart attack Father        died from heart attack at the age of 67   Alcohol abuse Father    Heart disease Father    Breast cancer Sister     Hypertension Brother    Heart attack Maternal Grandfather        died from heart attack at the age of 104   Early death Paternal Grandfather    Heart attack Paternal Grandfather    Colon cancer Neg Hx    Esophageal cancer Neg Hx    Rectal cancer Neg Hx    Stomach cancer Neg Hx    He indicated that his mother is alive. He indicated that his father is deceased. He indicated that his sister is alive. He indicated that his brother is alive. He indicated that his maternal grandmother is deceased. He indicated that his maternal grandfather is deceased. He indicated that his paternal grandmother is deceased. He indicated that his paternal grandfather is deceased. He indicated that the status of his neg hx is unknown.  Social History    Social History   Socioeconomic History   Marital status: Single    Spouse name: Not on file   Number of children: Not on file   Years of education: Not on file   Highest education level: Not on file  Occupational History   Not on file  Tobacco Use   Smoking status: Never   Smokeless tobacco: Never  Vaping Use   Vaping Use: Never used  Substance and Sexual Activity   Alcohol use: Yes    Alcohol/week: 12.0 standard drinks    Types: 12 Cans of beer per week    Comment: 5 beers a week   Drug use: No   Sexual activity: Yes    Comment: male 8 months partner  Other Topics Concern   Not on file  Social History Narrative   Not on file   Social Determinants of Health   Financial Resource Strain: Not on file  Food Insecurity: Not on file  Transportation Needs: Not on file  Physical Activity: Not on file  Stress: Not on file  Social Connections: Not on file  Intimate Partner Violence: Not on file     Review of Systems    General:  No chills, fever, night sweats or weight changes.  Cardiovascular:  No chest pain, dyspnea on exertion, edema, orthopnea, palpitations, paroxysmal nocturnal dyspnea. Dermatological: No rash, lesions/masses Respiratory:  No cough, dyspnea Urologic: No hematuria,  dysuria Abdominal:   No nausea, vomiting, diarrhea, bright red blood per rectum, melena, or hematemesis Neurologic:  No visual changes, wkns, changes in mental status. All other systems reviewed and are otherwise negative except as noted above.  Physical Exam    VS:  There were no vitals taken for this visit. , BMI There is no height or weight on file to calculate BMI. GEN: Well nourished, well developed, in no acute distress. HEENT: normal. Neck: Supple, no JVD, carotid bruits, or masses. Cardiac: RRR, no murmurs, rubs, or gallops. No clubbing, cyanosis, edema.  Radials/DP/PT 2+ and equal bilaterally.  Respiratory:  Respirations regular and unlabored, clear to auscultation bilaterally. GI: Soft, nontender, nondistended, BS + x 4. MS: no deformity or atrophy. Skin: warm and dry, no rash. Neuro:  Strength and sensation are intact. Psych: Normal affect.  Accessory Clinical Findings    Recent Labs: 01/12/2020: TSH 1.850 08/14/2020: ALT 18; BUN 14; Creatinine, Ser 1.14; Hemoglobin 15.0; Platelets 227; Potassium 4.7; Sodium 142   Recent Lipid Panel    Component Value Date/Time   CHOL 129 08/14/2020 1111   TRIG 132 08/14/2020 1111   HDL 36 (L) 08/14/2020 1111   CHOLHDL 3.6 08/14/2020 1111   CHOLHDL 4.9 11/25/2018 0431   VLDL 31 11/25/2018 0431   LDLCALC 69 08/14/2020 1111    ECG personally reviewed by me today- *** - No acute changes  EKG 07/04/2020  normal sinus rhythm with sinus arrhythmia possible left atrial enlargement left axis deviation septal infarct undetermined age 29 bpm- No acute changes   Cardiac catheterization 11/24/2018   Prox RCA to Mid RCA lesion is 95% stenosed. Dist RCA lesion is 30% stenosed. RPAV lesion is 100% stenosed. Dist Cx lesion is 99% stenosed. There is severe left ventricular systolic dysfunction. LV end diastolic pressure is severely elevated. The left ventricular ejection fraction is less than 25% by  visual estimate. Ost LAD to Prox LAD lesion is 100% stenosed. Post intervention, there is a 0% residual stenosis. A drug-eluting stent was successfully placed using a STENT RESOLUTE ONYX 3.5X22. A drug-eluting stent was successfully placed using a Bertrand [id number]. Mid LAD-1 lesion is 30% stenosed. Mid LAD-2 lesion is 100% stenosed. Post intervention, there is a 0% residual stenosis.   1.  Severe three-vessel coronary artery disease.  Occluded ostial and mid LAD likely due to late presenting anterior STEMI.  Significant distal left circumflex disease but the supplied territory is small.  Significant mid RCA stenosis with occluded posterior AV groove with left-to-right collaterals. 2.  Severely reduced LV systolic function with an EF of 20 to 25% with anterior wall akinesis.  LVEDP was 32 mmHg. 3.  Successful PCI and drug-eluting stent placement to the ostial as well as mid LAD.   Recommendations: The patient was given furosemide 40 mg IV after the case.  Continue furosemide 40 mg by mouth twice daily starting tomorrow. I started small dose carvedilol. If renal function remains stable, recommend adding a small dose ARB with plans to transition to [facility] as an outpatient.  In addition, should consider spironolactone if blood pressure allows. Recommend dual antiplatelet therapy for at least 1 year. Recommend staged mid RCA PCI likely on Friday if the patient remains stable.  The rest of his coronary artery disease should be treated medically.   Diagnostic Dominance: Right      Intervention            Cardiac catheterization 11/26/2018 Previously placed Ost LAD to Prox LAD drug eluting  stent is widely patent. Balloon angioplasty was performed. Previously placed Mid LAD-2 drug eluting stent is widely patent. Balloon angioplasty was performed. Mid LAD-1 lesion is 30% stenosed. Dist Cx lesion is 99% stenosed. Prox RCA to Mid RCA lesion is 95% stenosed. Dist RCA lesion is  30% stenosed. RPAV lesion is 100% stenosed. Post intervention, there is a 0% residual stenosis. A drug-eluting stent was successfully placed using a STENT RESOLUTE ONYX 2.75X18.   1.  Widely patent LAD stents with unchanged coronary anatomy. 2.  LVEDP was 13 mmHg with mild hypotension.  The patient was given 250 normal saline bolus. 3.  Successful angioplasty and drug-eluting stent placement to the proximal/mid right coronary artery.   Recommendations: Continue dual antiplatelet therapy. I decreased furosemide to 40 mg once daily.  Continue other heart failure medications.  Given intermittent hypotension.  I do not recommend starting Entresto at the present time.  I elected to start small dose losartan.  If this is well-tolerated, he can be switched as an outpatient to [facility].  I agree with the LifeVest.   Diagnostic Dominance: Right      Intervention          Echocardiogram 02/23/2019   IMPRESSIONS     1. Left ventricular ejection fraction, by visual estimation, is 35 to  40%. The left ventricle has moderately decreased function. There is no  left ventricular hypertrophy.   2. Left ventricular diastolic Doppler parameters are consistent with  pseudonormalization pattern of LV diastolic filling.   3. There is moderate hypokinesis of the apex.   4. Global right ventricle has normal systolic function.The right  ventricular size is normal. No increase in right ventricular wall  thickness.   5. Left atrial size was moderately dilated.   6. Right atrial size was mildly dilated.   7. The mitral valve is normal in structure. Trace mitral valve  regurgitation.   8. The tricuspid valve is normal in structure. Tricuspid valve  regurgitation is trivial.   9. The aortic valve is tricuspid Aortic valve regurgitation was not  visualized by color flow Doppler.  10. The pulmonic valve was normal in structure. Pulmonic valve  regurgitation is not visualized by color flow Doppler.  11.  Normal pulmonary artery systolic pressure.  12. The atrial septum is grossly normal.  Echocardiogram 08/23/2020 IMPRESSIONS     1. Hypokinesis of the septum, apical anterior and apical myocardium. Left  ventricular ejection fraction, by estimation, is 35 to 40%. The left  ventricle has moderately decreased function. The left ventricle  demonstrates regional wall motion  abnormalities (see scoring diagram/findings for description). There is  mild concentric left ventricular hypertrophy. Left ventricular diastolic  parameters are consistent with Grade II diastolic dysfunction  (pseudonormalization).   2. Right ventricular systolic function is normal. The right ventricular  size is normal.   3. Left atrial size was mildly dilated.   4. The mitral valve is normal in structure. Trivial mitral valve  regurgitation. No evidence of mitral stenosis.   5. The aortic valve is tricuspid. Aortic valve regurgitation is not  visualized. No aortic stenosis is present.   6. The inferior vena cava is dilated in size with >50% respiratory  variability, suggesting right atrial pressure of 8 mmHg.   Assessment & Plan   1.  Coronary artery disease-no chest pain .Underwent staged cardiac catheterization 11/24/2018 and 11/26/2018.  Received total DES x3 to LAD and RPDA.   Continue atorvastatin, carvedilol, aspirin, Brilinta, losartan, nitroglycerin, omega-3 fatty acids Heart  healthy low-sodium diet-salty 6 given Maintain physical activity   Ischemic cardiomyopathy-has noticed some increased activity intolerance and dyspnea on exertion over the last 2 weeks. Echocardiogram 02/23/2019 showed no improvement in his EF 35-40%. Continue spironolactone, potassium, omega-3, furosemide, carvedilol Heart healthy low-sodium diet-salty 6 given Maintain physical activity   Hyperlipidemia-05/24/2020: Cholesterol, Total 162; HDL 32; LDL Chol Calc (NIH) 59; Triglycerides 464 Continue atorvastatin, fenofibrate Heart healthy  low-sodium high-fiber diet Maintain physical activity   Chest wall pain-resolved.  Underwent staged cardiac catheterization 11/24/2018 and 11/26/2018.  Received total DES x3 to LAD and RPDA.  Echocardiogram 02/23/2019 showed an improvement in his ejection fraction to 35-40%.  Current symptoms appear to be related to muscle strain/sprain. Heart healthy low-sodium diet-salty 6 given Increase physical activity as tolerated  Obesity-weight today ***293 pounds.   Heart healthy low-sodium diet Maintain physical activity   Disposition: Follow-up with Dr. Gwenlyn Found or me in 6 months.   Jossie Ng. Ashonte Angelucci NP-C    10/31/2020, 12:46 PM [address] Office [phone] Fax [phone]  Notice: This dictation was prepared with Dragon dictation along with smaller phrase technology. Any transcriptional errors that result from this process are unintentional and may not be corrected upon review.  I spent***minutes examining this patient, reviewing medications, and using patient centered shared decision making involving her cardiac care.  Prior to her visit I spent greater than 20 minutes reviewing her past medical history,  medications, and prior cardiac tests.

## 2020-11-02 ENCOUNTER — Ambulatory Visit: Payer: Medicaid Other | Admitting: General Practice

## 2020-11-14 ENCOUNTER — Other Ambulatory Visit: Payer: Self-pay

## 2020-11-14 ENCOUNTER — Ambulatory Visit: Payer: Medicaid Other | Admitting: Family Medicine

## 2020-11-14 ENCOUNTER — Encounter: Payer: Self-pay | Admitting: Family Medicine

## 2020-11-14 VITALS — BP 106/69 | HR 80 | Ht 76.5 in | Wt 263.0 lb

## 2020-11-14 DIAGNOSIS — E11610 Type 2 diabetes mellitus with diabetic neuropathic arthropathy: Secondary | ICD-10-CM

## 2020-11-14 DIAGNOSIS — E1169 Type 2 diabetes mellitus with other specified complication: Secondary | ICD-10-CM | POA: Diagnosis not present

## 2020-11-14 DIAGNOSIS — E11628 Type 2 diabetes mellitus with other skin complications: Secondary | ICD-10-CM

## 2020-11-14 DIAGNOSIS — I152 Hypertension secondary to endocrine disorders: Secondary | ICD-10-CM | POA: Diagnosis not present

## 2020-11-14 DIAGNOSIS — E1165 Type 2 diabetes mellitus with hyperglycemia: Secondary | ICD-10-CM

## 2020-11-14 DIAGNOSIS — Z794 Long term (current) use of insulin: Secondary | ICD-10-CM

## 2020-11-14 DIAGNOSIS — L84 Corns and callosities: Secondary | ICD-10-CM | POA: Diagnosis not present

## 2020-11-14 DIAGNOSIS — E1159 Type 2 diabetes mellitus with other circulatory complications: Secondary | ICD-10-CM

## 2020-11-14 DIAGNOSIS — E785 Hyperlipidemia, unspecified: Secondary | ICD-10-CM | POA: Diagnosis not present

## 2020-11-14 DIAGNOSIS — IMO0002 Reserved for concepts with insufficient information to code with codable children: Secondary | ICD-10-CM

## 2020-11-14 LAB — BAYER DCA HB A1C WAIVED: HB A1C (BAYER DCA - WAIVED): 6.7 % (ref <7.0)

## 2020-11-14 MED ORDER — ATORVASTATIN CALCIUM 80 MG PO TABS: 80.0000 mg | ORAL_TABLET | Freq: Every day | ORAL | 3 refills | Status: DC

## 2020-11-14 MED ORDER — FENOFIBRATE 145 MG PO TABS: 145.0000 mg | ORAL_TABLET | Freq: Every day | ORAL | 3 refills | Status: DC

## 2020-11-14 MED ORDER — SILDENAFIL CITRATE 20 MG PO TABS: 20.0000 mg | ORAL_TABLET | ORAL | 1 refills | Status: DC | PRN

## 2020-11-14 NOTE — Progress Notes (Signed)
BP 106/69   Pulse 80   Ht 6' 4.5" (1.943 m)   Wt 263 lb (119.3 kg)   SpO2 98%   BMI 31.60 kg/m    Subjective:   Patient ID: Evan [name], male    DOB: 12/27/1965, 55 y.o.   MRN: [id number]  HPI: Evan [name] is a 55 y.o. male presenting on 11/14/2020 for Medical Management of Chronic Issues, Diabetes, Hypertension, and Hyperlipidemia   HPI Type 2 diabetes mellitus Patient comes in today for recheck of his diabetes. Patient has been currently taking Trulicity and Jardiance, A1c 6.7. Patient is currently on an ACE inhibitor/ARB. Patient has not seen an ophthalmologist this year. Patient denies any issues with their feet. The symptom started onset as an adult hypertension and hyperlipidemia ARE RELATED TO DM   Hyperlipidemia Patient is coming in for recheck of his hyperlipidemia. The patient is currently taking Lipitor and fish oil. They deny any issues with myalgias or history of liver damage from it. They deny any focal numbness or weakness or chest pain.   Hypertension Patient is currently on carvedilol and furosemide and losartan and spironolactone, and their blood pressure today is 106/69. Patient denies any lightheadedness or dizziness. Patient denies headaches, blurred vision, chest pains, shortness of breath, or weakness. Denies any side effects from medication and is content with current medication.   Relevant past medical, surgical, family and social history reviewed and updated as indicated. Interim medical history since our last visit reviewed. Allergies and medications reviewed and updated.  Review of Systems  Constitutional:  Negative for chills and fever.  Eyes:  Negative for visual disturbance.  Respiratory:  Negative for shortness of breath and wheezing.   Cardiovascular:  Negative for chest pain and leg swelling.  Musculoskeletal:  Negative for back pain and gait problem.  Skin:  Negative for rash.  Neurological:  Negative for dizziness, weakness and numbness.   All other systems reviewed and are negative.  Per HPI unless specifically indicated above   Allergies as of 11/14/2020   No Known Allergies      Medication List        Accurate as of November 14, 2020 11:25 AM. If you have any questions, ask your nurse or doctor.          STOP taking these medications    Plenvu 140 g Solr Generic drug: PEG-KCl-NaCl-NaSulf-Na Asc-C Stopped by: Fransisca Kaufmann Meko Bellanger, MD       TAKE these medications    Accu-Chek Aviva device Test BS BID Dx E11.69   Accu-Chek Aviva Plus test strip Generic drug: glucose blood 1 each by Other route in the morning and at bedtime. Use as instructed   aspirin EC 81 MG tablet Take 81 mg by mouth daily.   atorvastatin 80 MG tablet Commonly known as: LIPITOR Take 1 tablet (80 mg total) by mouth daily at 6 PM.   BD Pen Needle Nano 2nd Gen 32G X 4 MM Misc Generic drug: Insulin Pen Needle Use daily with insulin Dx E11.9, Z79.4   carvedilol 12.5 MG tablet Commonly known as: COREG Take 1 tablet (12.5 mg total) by mouth 2 (two) times daily with a meal.   famotidine 20 MG tablet Commonly known as: Pepcid Take 1 tablet (20 mg total) by mouth 2 (two) times daily.   fenofibrate 145 MG tablet Commonly known as: TRICOR Take 1 tablet (145 mg total) by mouth daily.   Fish Oil 1000 MG Caps Take 2,000 mg by mouth daily.  fluticasone 50 MCG/ACT nasal spray Commonly known as: FLONASE PLACE 1 SPRAY INTO BOTH NOSTRILS DAILY.   furosemide 20 MG tablet Commonly known as: LASIX Take 1 tablet (20 mg total) by mouth daily.   Jardiance 25 MG Tabs tablet Generic drug: empagliflozin Take 1 tablet (25 mg total) by mouth daily.   losartan 25 MG tablet Commonly known as: COZAAR Take 1 tablet (25 mg total) by mouth at bedtime.   nitroGLYCERIN 0.4 MG SL tablet Commonly known as: NITROSTAT Place 1 tablet (0.4 mg total) under the tongue every 5 (five) minutes x 3 doses as needed for chest pain.   potassium chloride 10  MEQ tablet Commonly known as: KLOR-CON TAKE 1 TABLET BY MOUTH EVERY DAY   spironolactone 25 MG tablet Commonly known as: ALDACTONE TAKE 1/2 TABLET BY MOUTH EVERY DAY   ticagrelor 90 MG Tabs tablet Commonly known as: Brilinta Take 1 tablet (90 mg total) by mouth 2 (two) times daily.   Trulicity 1.5 OY/7.7AJ Sopn Generic drug: Dulaglutide Inject 1.5 mg into the skin once a week. What changed: when to take this         Objective:   BP 106/69   Pulse 80   Ht 6' 4.5" (1.943 m)   Wt 263 lb (119.3 kg)   SpO2 98%   BMI 31.60 kg/m   Wt Readings from Last 3 Encounters:  11/14/20 263 lb (119.3 kg)  09/14/20 281 lb (127.5 kg)  08/28/20 283 lb (128.4 kg)    Physical Exam Vitals and nursing note reviewed.  Constitutional:      General: He is not in acute distress.    Appearance: He is well-developed. He is not diaphoretic.  Eyes:     General: No scleral icterus.    Conjunctiva/sclera: Conjunctivae normal.  Neck:     Thyroid: No thyromegaly.  Cardiovascular:     Rate and Rhythm: Normal rate and regular rhythm.     Heart sounds: Normal heart sounds. No murmur heard. Pulmonary:     Effort: Pulmonary effort is normal. No respiratory distress.     Breath sounds: Normal breath sounds. No wheezing.  Musculoskeletal:        General: Normal range of motion.     Cervical back: Neck supple.  Lymphadenopathy:     Cervical: No cervical adenopathy.  Skin:    General: Skin is warm and dry.     Findings: No rash.  Neurological:     Mental Status: He is alert and oriented to person, place, and time.     Coordination: Coordination normal.  Psychiatric:        Behavior: Behavior normal.      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  Seems to be doing well, A1c looks good Follow up plan: Return in about 3 months (around 02/14/2021), or if symptoms worsen or fail to improve, for Diabetes and hypertension cholesterol.  Counseling provided for all of the vaccine components Orders Placed This Encounter  Procedures   Bayer [place] Hb A1c [place] Kalanie Fewell, MD [facility] 11/14/2020, 11:25 AM

## 2021-02-15 ENCOUNTER — Encounter: Payer: Self-pay | Admitting: Family Medicine

## 2021-02-15 ENCOUNTER — Ambulatory Visit (INDEPENDENT_AMBULATORY_CARE_PROVIDER_SITE_OTHER): Payer: Medicaid Other | Admitting: Family Medicine

## 2021-02-15 ENCOUNTER — Other Ambulatory Visit: Payer: Self-pay

## 2021-02-15 VITALS — BP 131/78 | HR 84 | Ht 76.5 in | Wt 269.0 lb

## 2021-02-15 DIAGNOSIS — Z794 Long term (current) use of insulin: Secondary | ICD-10-CM | POA: Diagnosis not present

## 2021-02-15 DIAGNOSIS — E785 Hyperlipidemia, unspecified: Secondary | ICD-10-CM

## 2021-02-15 DIAGNOSIS — E1169 Type 2 diabetes mellitus with other specified complication: Secondary | ICD-10-CM | POA: Diagnosis not present

## 2021-02-15 DIAGNOSIS — Z23 Encounter for immunization: Secondary | ICD-10-CM | POA: Diagnosis not present

## 2021-02-15 DIAGNOSIS — L84 Corns and callosities: Secondary | ICD-10-CM | POA: Diagnosis not present

## 2021-02-15 DIAGNOSIS — I152 Hypertension secondary to endocrine disorders: Secondary | ICD-10-CM | POA: Diagnosis not present

## 2021-02-15 DIAGNOSIS — E1165 Type 2 diabetes mellitus with hyperglycemia: Secondary | ICD-10-CM | POA: Diagnosis not present

## 2021-02-15 DIAGNOSIS — E11628 Type 2 diabetes mellitus with other skin complications: Secondary | ICD-10-CM | POA: Diagnosis not present

## 2021-02-15 DIAGNOSIS — E11610 Type 2 diabetes mellitus with diabetic neuropathic arthropathy: Secondary | ICD-10-CM | POA: Diagnosis not present

## 2021-02-15 DIAGNOSIS — E118 Type 2 diabetes mellitus with unspecified complications: Secondary | ICD-10-CM | POA: Insufficient documentation

## 2021-02-15 DIAGNOSIS — E1159 Type 2 diabetes mellitus with other circulatory complications: Secondary | ICD-10-CM | POA: Diagnosis not present

## 2021-02-15 LAB — LIPID PANEL
Chol/HDL Ratio: 3.9 ratio (ref 0.0–5.0)
Cholesterol, Total: 148 mg/dL (ref 100–199)
HDL: 38 mg/dL — ABNORMAL LOW (ref >39)
LDL Chol Calc (NIH): 91 mg/dL (ref 0–99)
Triglycerides: 101 mg/dL (ref 0–149)
VLDL Cholesterol Cal: 19 mg/dL (ref 5–40)

## 2021-02-15 LAB — CMP14+EGFR
ALT: 23 IU/L (ref 0–44)
AST: 18 IU/L (ref 0–40)
Albumin/Globulin Ratio: 2.1 (ref 1.2–2.2)
Albumin: 5 g/dL — ABNORMAL HIGH (ref 3.8–4.9)
Alkaline Phosphatase: 70 IU/L (ref 44–121)
BUN/Creatinine Ratio: 20 (ref 9–20)
BUN: 26 mg/dL — ABNORMAL HIGH (ref 6–24)
Bilirubin Total: 0.3 mg/dL (ref 0.0–1.2)
CO2: 20 mmol/L (ref 20–29)
Calcium: 9.4 mg/dL (ref 8.7–10.2)
Chloride: 100 mmol/L (ref 96–106)
Creatinine, Ser: 1.29 mg/dL — ABNORMAL HIGH (ref 0.76–1.27)
Globulin, Total: 2.4 g/dL (ref 1.5–4.5)
Glucose: 124 mg/dL — ABNORMAL HIGH (ref 70–99)
Potassium: 4.3 mmol/L (ref 3.5–5.2)
Sodium: 139 mmol/L (ref 134–144)
Total Protein: 7.4 g/dL (ref 6.0–8.5)
eGFR: 65 mL/min/{1.73_m2} (ref >59)

## 2021-02-15 LAB — CBC WITH DIFFERENTIAL/PLATELET
Basophils Absolute: 0.1 10*3/uL (ref 0.0–0.2)
Basos: 1 %
EOS (ABSOLUTE): 0.3 10*3/uL (ref 0.0–0.4)
Eos: 3 %
Hematocrit: 46.9 % (ref 37.5–51.0)
Hemoglobin: 15.3 g/dL (ref 13.0–17.7)
Immature Grans (Abs): 0 10*3/uL (ref 0.0–0.1)
Immature Granulocytes: 0 %
Lymphocytes Absolute: 2.1 10*3/uL (ref 0.7–3.1)
Lymphs: 22 %
MCH: 30.4 pg (ref 26.6–33.0)
MCHC: 32.6 g/dL (ref 31.5–35.7)
MCV: 93 fL (ref 79–97)
Monocytes Absolute: 0.9 10*3/uL (ref 0.1–0.9)
Monocytes: 9 %
Neutrophils Absolute: 6.3 10*3/uL (ref 1.4–7.0)
Neutrophils: 65 %
Platelets: 273 10*3/uL (ref 150–450)
RBC: 5.04 x10E6/uL (ref 4.14–5.80)
RDW: 13.5 % (ref 11.6–15.4)
WBC: 9.6 10*3/uL (ref 3.4–10.8)

## 2021-02-15 LAB — BAYER DCA HB A1C WAIVED: HB A1C (BAYER DCA - WAIVED): 6.2 % — ABNORMAL HIGH (ref 4.8–5.6)

## 2021-02-15 NOTE — Progress Notes (Signed)
BP 131/78   Pulse 84   Ht 6' 4.5" (1.943 m)   Wt 269 lb (122 kg)   SpO2 99%   BMI 32.32 kg/m    Subjective:   Patient ID: Evan [name], male    DOB: 1966-05-08, 55 y.o.   MRN: [id number]  HPI: Evan [name] is a 55 y.o. male presenting on 02/15/2021 for Medical Management of Chronic Issues and Diabetes   HPI Type 2 diabetes mellitus Patient comes in today for recheck of his diabetes. Patient has been currently taking Trulicity and Jardiance. Patient is currently on an ACE inhibitor/ARB. Patient has not seen an ophthalmologist this year. Patient denies any issues with their feet. The symptom started onset as an adult hyperlipidemia and hypertension ARE RELATED TO DM   Hyperlipidemia Patient is coming in for recheck of his hyperlipidemia. The patient is currently taking fenofibrate and Lipitor and fish oil. They deny any issues with myalgias or history of liver damage from it. They deny any focal numbness or weakness or chest pain.   Hypertension Patient is currently on carvedilol and furosemide and losartan and spironolactone, and their blood pressure today is 131/78. Patient denies any lightheadedness or dizziness. Patient denies headaches, blurred vision, chest pains, shortness of breath, or weakness. Denies any side effects from medication and is content with current medication.  He also has a history of CAD and sees cardiology  Relevant past medical, surgical, family and social history reviewed and updated as indicated. Interim medical history since our last visit reviewed. Allergies and medications reviewed and updated.  Review of Systems  Constitutional:  Negative for chills and fever.  Eyes:  Negative for visual disturbance.  Respiratory:  Negative for shortness of breath and wheezing.   Cardiovascular:  Negative for chest pain and leg swelling.  Musculoskeletal:  Negative for back pain and gait problem.  Skin:  Negative for rash.  Neurological:  Negative for dizziness,  weakness and numbness.  All other systems reviewed and are negative.  Per HPI unless specifically indicated above   Allergies as of 02/15/2021   No Known Allergies      Medication List        Accurate as of February 15, 2021 11:28 AM. If you have any questions, ask your nurse or doctor.          Accu-Chek Aviva device Test BS BID Dx E11.69   Accu-Chek Aviva Plus test strip Generic drug: glucose blood 1 each by Other route in the morning and at bedtime. Use as instructed   aspirin EC 81 MG tablet Take 81 mg by mouth daily.   atorvastatin 80 MG tablet Commonly known as: LIPITOR Take 1 tablet (80 mg total) by mouth daily at 6 PM.   BD Pen Needle Nano 2nd Gen 32G X 4 MM Misc Generic drug: Insulin Pen Needle Use daily with insulin Dx E11.9, Z79.4   carvedilol 12.5 MG tablet Commonly known as: COREG Take 1 tablet (12.5 mg total) by mouth 2 (two) times daily with a meal.   famotidine 20 MG tablet Commonly known as: Pepcid Take 1 tablet (20 mg total) by mouth 2 (two) times daily.   fenofibrate 145 MG tablet Commonly known as: TRICOR Take 1 tablet (145 mg total) by mouth daily.   Fish Oil 1000 MG Caps Take 2,000 mg by mouth daily.   fluticasone 50 MCG/ACT nasal spray Commonly known as: FLONASE PLACE 1 SPRAY INTO BOTH NOSTRILS DAILY.   furosemide 20 MG tablet Commonly  known as: LASIX Take 1 tablet (20 mg total) by mouth daily.   Jardiance 25 MG Tabs tablet Generic drug: empagliflozin Take 1 tablet (25 mg total) by mouth daily.   losartan 25 MG tablet Commonly known as: COZAAR Take 1 tablet (25 mg total) by mouth at bedtime.   potassium chloride 10 MEQ tablet Commonly known as: KLOR-CON TAKE 1 TABLET BY MOUTH EVERY DAY   sildenafil 20 MG tablet Commonly known as: REVATIO Take 1-3 tablets (20-60 mg total) by mouth as needed.   spironolactone 25 MG tablet Commonly known as: ALDACTONE TAKE 1/2 TABLET BY MOUTH EVERY DAY   ticagrelor 90 MG Tabs  tablet Commonly known as: Brilinta Take 1 tablet (90 mg total) by mouth 2 (two) times daily.   Trulicity 1.5 XL/2.4MW Sopn Generic drug: Dulaglutide Inject 1.5 mg into the skin once a week. What changed: when to take this         Objective:   BP 131/78   Pulse 84   Ht 6' 4.5" (1.943 m)   Wt 269 lb (122 kg)   SpO2 99%   BMI 32.32 kg/m   Wt Readings from Last 3 Encounters:  02/15/21 269 lb (122 kg)  11/14/20 263 lb (119.3 kg)  09/14/20 281 lb (127.5 kg)    Physical Exam Vitals and nursing note reviewed.  Constitutional:      General: He is not in acute distress.    Appearance: He is well-developed. He is not diaphoretic.  Eyes:     General: No scleral icterus.    Conjunctiva/sclera: Conjunctivae normal.  Neck:     Thyroid: No thyromegaly.  Cardiovascular:     Rate and Rhythm: Normal rate and regular rhythm.     Heart sounds: Normal heart sounds. No murmur heard. Pulmonary:     Effort: Pulmonary effort is normal. No respiratory distress.     Breath sounds: Normal breath sounds. No wheezing.  Musculoskeletal:        General: Normal range of motion.     Cervical back: Neck supple.  Lymphadenopathy:     Cervical: No cervical adenopathy.  Skin:    General: Skin is warm and dry.     Findings: No rash.  Neurological:     Mental Status: He is alert and oriented to person, place, and time.     Coordination: Coordination normal.  Psychiatric:        Behavior: Behavior normal.      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  Seems to be doing very well. Follow up plan: Return in about 3 months (around 05/17/2021), or if symptoms worsen or fail to improve, for Diabetes recheck.  Counseling provided for all of the vaccine components Orders  Placed This Encounter  Procedures   Flu Vaccine QUAD 51moIM (Fluarix, Fluzone & Alfiuria Quad PF)   CBC with Differential/Platelet   CMP14+EGFR   Lipid panel   Bayer DCA Hb A1c Waived    JCaryl Pina MD [facility] 02/15/2021, 11:28 AM

## 2021-03-05 ENCOUNTER — Other Ambulatory Visit: Payer: Self-pay | Admitting: Family Medicine

## 2021-03-27 ENCOUNTER — Other Ambulatory Visit: Payer: Self-pay | Admitting: Family Medicine

## 2021-04-16 IMAGING — CT CT ANGIO CHEST
2 of 6 series · 19 of 46 positions shown · IV contrast (Omnipaque or Isovue)
Comparison: Chest radiograph dated 11/27/2018 and CT chest dated
01/02/2019.

CLINICAL DATA: Chest pain and shortness of breath.

EXAM:
CT ANGIOGRAPHY CHEST WITH CONTRAST
TECHNIQUE: Multidetector CT imaging of the chest was performed using the
standard protocol during bolus administration of intravenous
contrast. Multiplanar CT image reconstructions and MIPs were
obtained to evaluate the vascular anatomy.
CONTRAST:  100mL OMNIPAQUE IOHEXOL 350 MG/ML SOLN

[Series 6: pe axial thins · axial · 0.78mm/px · z∈[+1153,+1438]mm · 16 of 313 slices shown]
[im 14/313  lung]
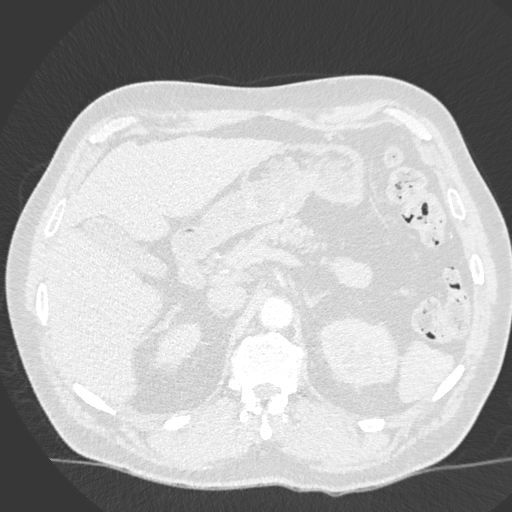
[im 41/313  soft-tissue]
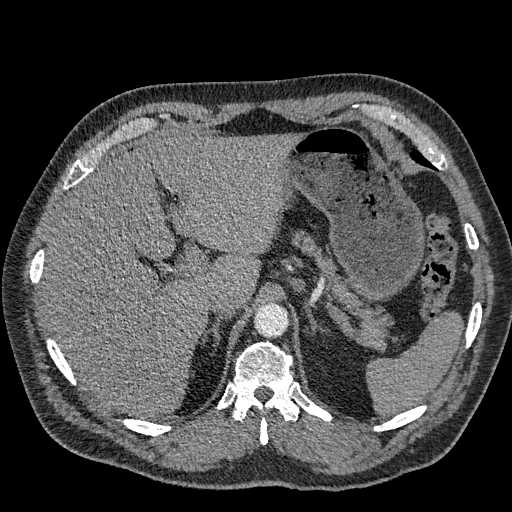
[im 55/313  lung]
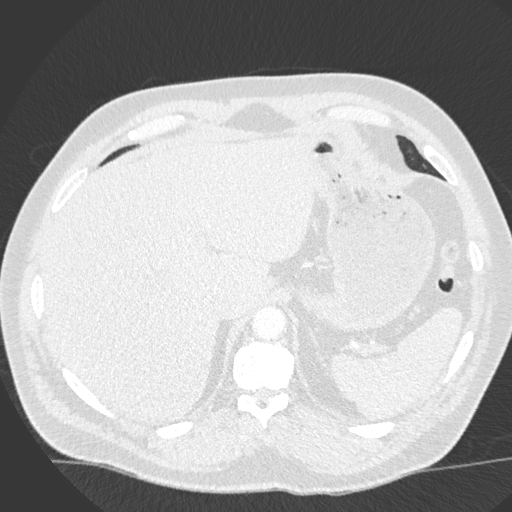
[im 68/313  soft-tissue]
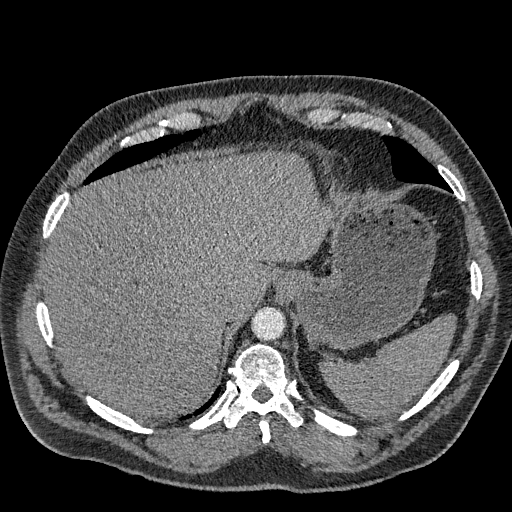
[im 95/313  lung]
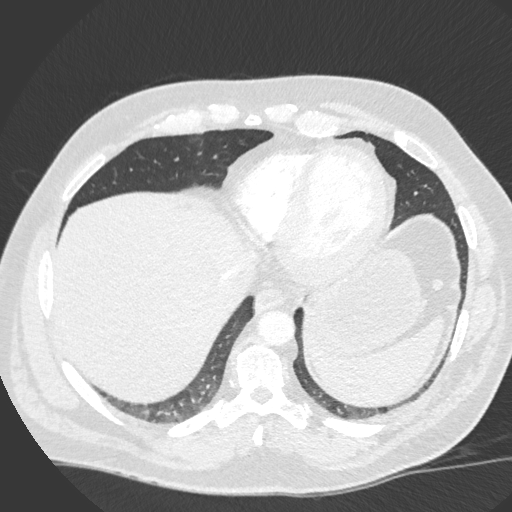
[im 109/313  soft-tissue]
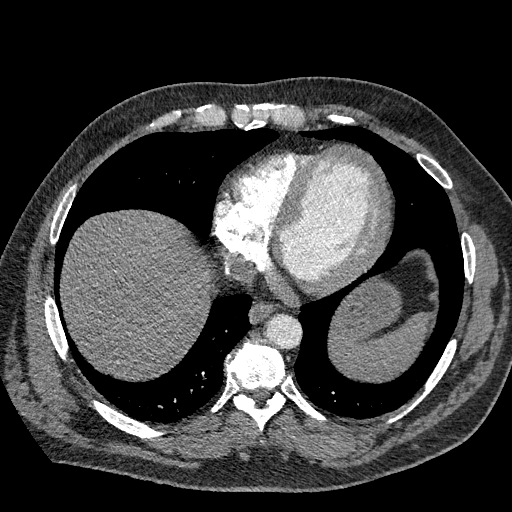
[im 123/313  lung]
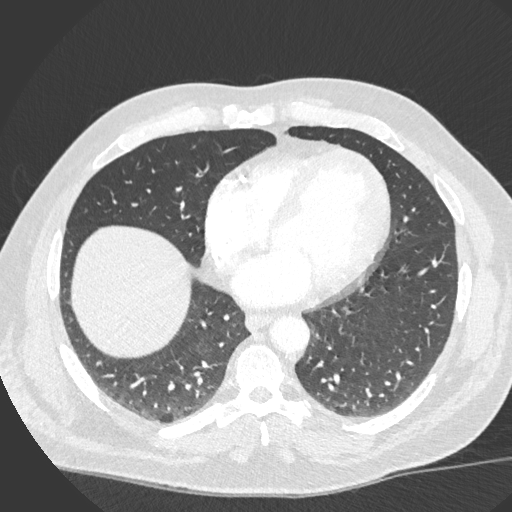
[im 150/313  soft-tissue]
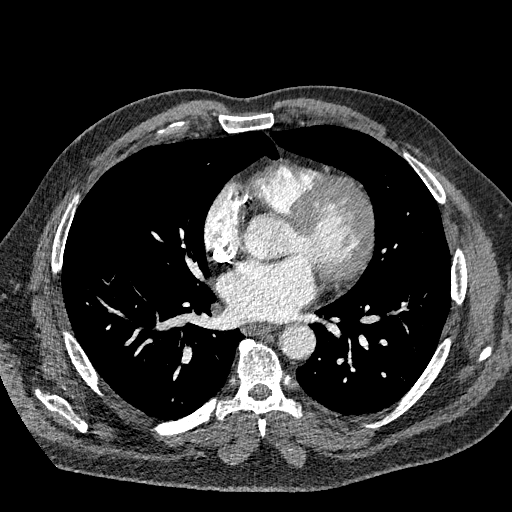
[im 163/313  lung]
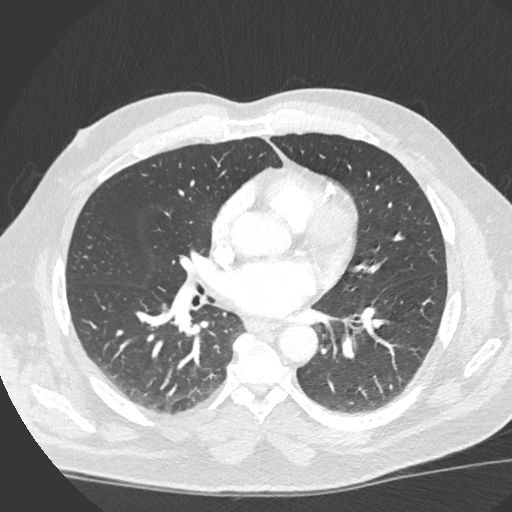
[im 190/313  soft-tissue]
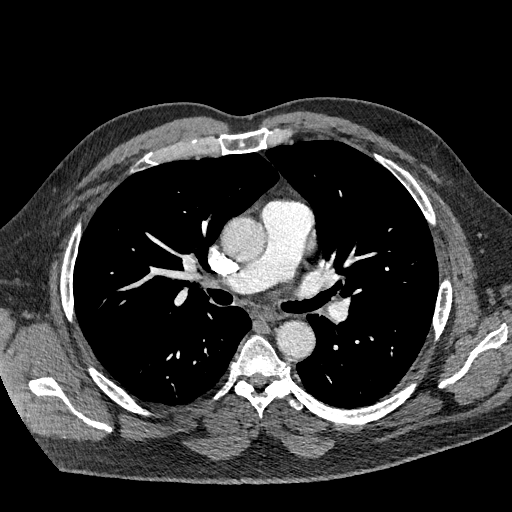
[im 204/313  lung]
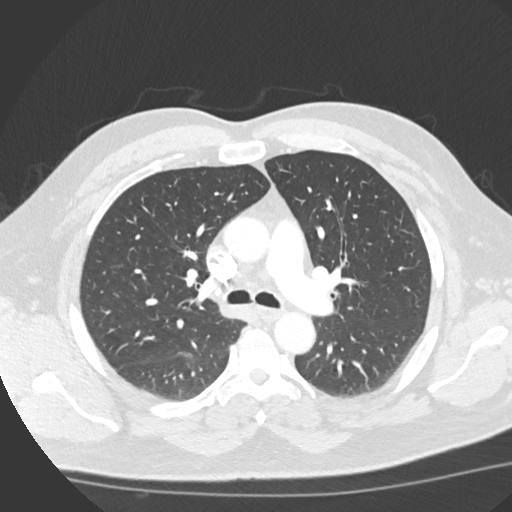
[im 218/313  soft-tissue]
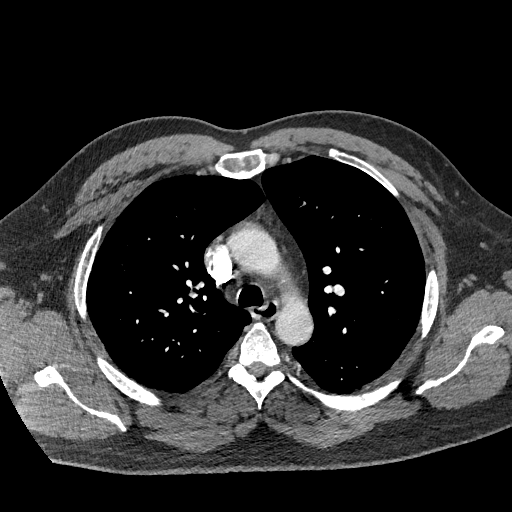
[im 245/313  lung]
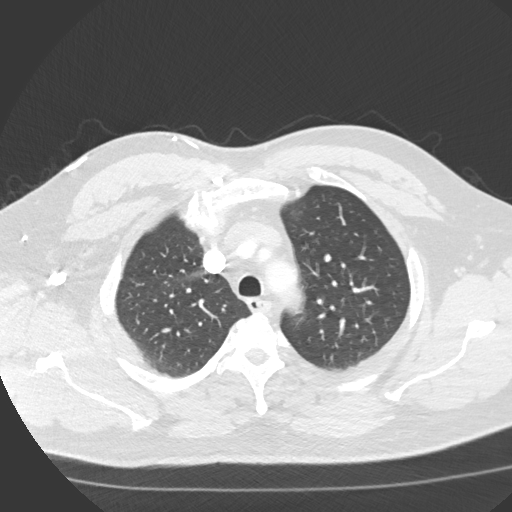
[im 258/313  soft-tissue]
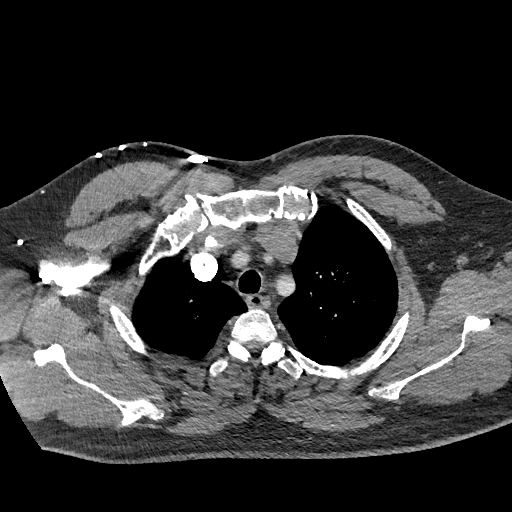
[im 272/313  lung]
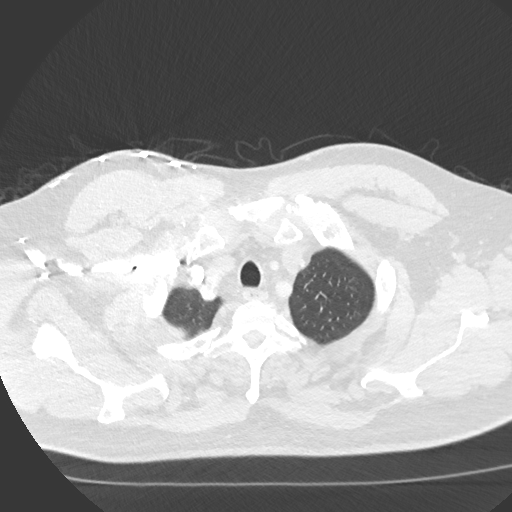
[im 299/313  soft-tissue]
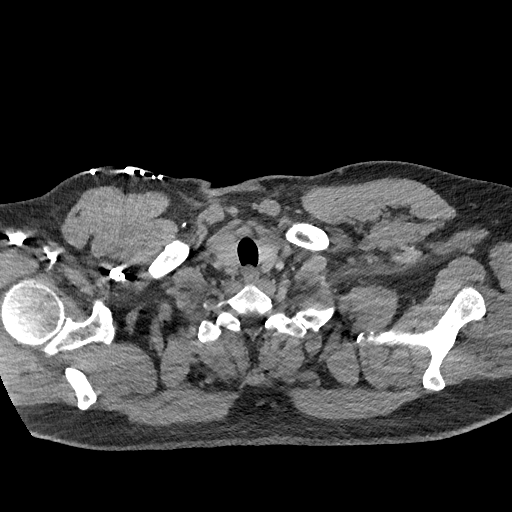

[Series 8: cor soft · coronal · 0.66mm/px · 3 of 156 slices shown]
[im 39/156  soft-tissue]
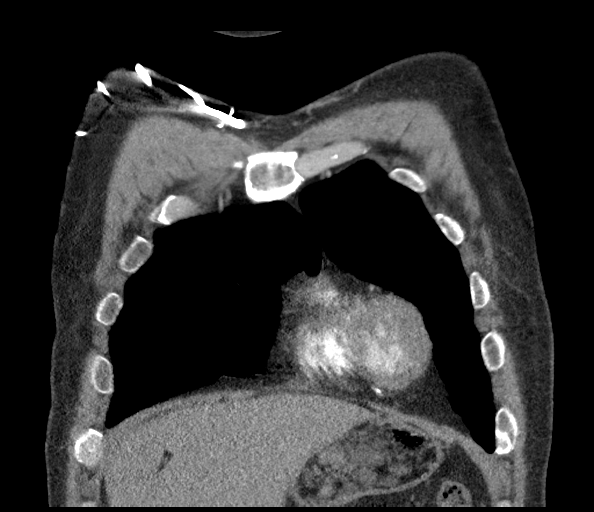
[im 78/156  soft-tissue]
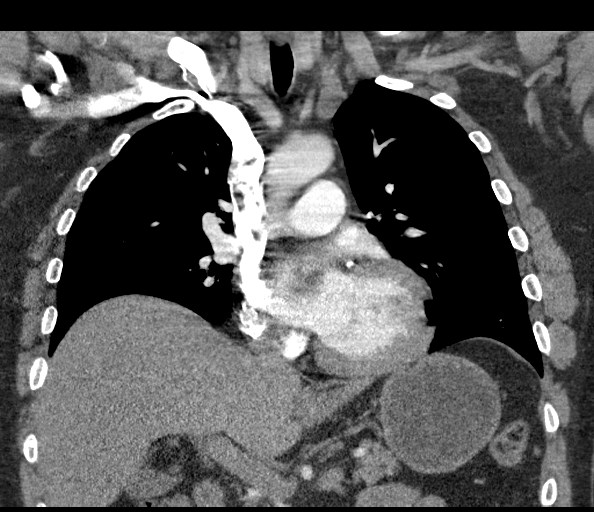
[im 117/156  soft-tissue]
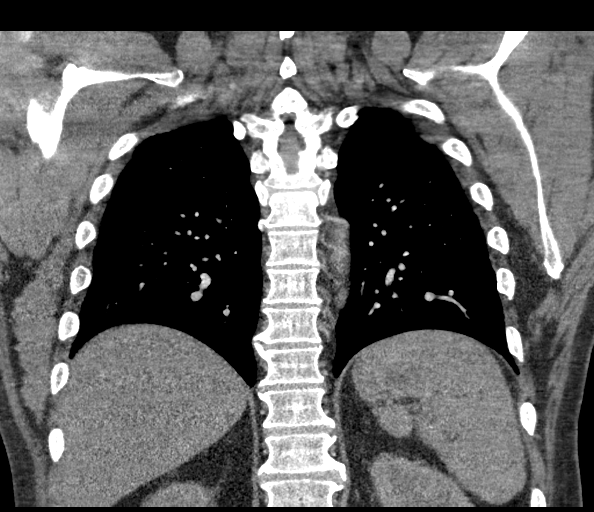

[19 of 46 positions shown; findings below may reference images not displayed]

FINDINGS: Cardiovascular: Satisfactory opacification of the pulmonary arteries
to the segmental level. No evidence of pulmonary embolism. Vascular
calcifications are seen in the coronary arteries and aortic arch.
Normal heart size. No pericardial effusion.

Mediastinum/Nodes: No enlarged mediastinal, hilar, or axillary lymph
nodes. Thyroid gland, trachea, and esophagus demonstrate no
significant findings.

Lungs/Pleura: There is minimal bilateral dependent atelectasis. No
pleural effusion or pneumothorax.

Upper Abdomen: No acute abnormality.

Musculoskeletal: No chest wall abnormality. No acute or significant
osseous findings.

Review of the MIP images confirms the above findings.
IMPRESSION: 1. No evidence of pulmonary embolism or acute pulmonary process.

Aortic Atherosclerosis ([id number].L).

## 2021-05-17 ENCOUNTER — Other Ambulatory Visit: Payer: Self-pay | Admitting: Family Medicine

## 2021-05-23 ENCOUNTER — Ambulatory Visit: Payer: Medicaid Other | Admitting: Family Medicine

## 2021-05-23 ENCOUNTER — Encounter: Payer: Self-pay | Admitting: Family Medicine

## 2021-05-23 VITALS — BP 116/68 | HR 62 | Ht 76.5 in | Wt 271.0 lb

## 2021-05-23 DIAGNOSIS — E1169 Type 2 diabetes mellitus with other specified complication: Secondary | ICD-10-CM

## 2021-05-23 DIAGNOSIS — E1159 Type 2 diabetes mellitus with other circulatory complications: Secondary | ICD-10-CM

## 2021-05-23 DIAGNOSIS — L84 Corns and callosities: Secondary | ICD-10-CM

## 2021-05-23 DIAGNOSIS — E11628 Type 2 diabetes mellitus with other skin complications: Secondary | ICD-10-CM | POA: Diagnosis not present

## 2021-05-23 DIAGNOSIS — E785 Hyperlipidemia, unspecified: Secondary | ICD-10-CM | POA: Diagnosis not present

## 2021-05-23 DIAGNOSIS — E118 Type 2 diabetes mellitus with unspecified complications: Secondary | ICD-10-CM | POA: Diagnosis not present

## 2021-05-23 DIAGNOSIS — Z23 Encounter for immunization: Secondary | ICD-10-CM

## 2021-05-23 DIAGNOSIS — I152 Hypertension secondary to endocrine disorders: Secondary | ICD-10-CM | POA: Diagnosis not present

## 2021-05-23 LAB — BAYER DCA HB A1C WAIVED: HB A1C (BAYER DCA - WAIVED): 6.6 % — ABNORMAL HIGH (ref 4.8–5.6)

## 2021-05-23 MED ORDER — POTASSIUM CHLORIDE ER 10 MEQ PO TBCR: 10.0000 meq | EXTENDED_RELEASE_TABLET | Freq: Every day | ORAL | 1 refills | Status: DC

## 2021-05-23 NOTE — Progress Notes (Signed)
BP 116/68    Pulse 62    Ht 6' 4.5" (1.943 m)    Wt 271 lb (122.9 kg)    SpO2 97%    BMI 32.56 kg/m    Subjective:   Patient ID: Evan [name], male    DOB: 1966/03/18, 56 y.o.   MRN: [id number]  HPI: Evan [name] is a 56 y.o. male presenting on 05/23/2021 for Medical Management of Chronic Issues and Diabetes   HPI Type 2 diabetes mellitus Patient comes in today for recheck of his diabetes. Patient has been currently taking Trulicity and Jardiance. Patient is currently on an ACE inhibitor/ARB. Patient has seen an ophthalmologist this year. Patient denies any issues with their feet. The symptom started onset as an adult htn and hld ARE RELATED TO DM   Hypertension Patient is currently on carvedilol and furosemide and spironolactone, and their blood pressure today is 116/68. Patient denies any lightheadedness or dizziness. Patient denies headaches, blurred vision, chest pains, shortness of breath, or weakness. Denies any side effects from medication and is content with current medication.   Hyperlipidemia Patient is coming in for recheck of his hyperlipidemia. The patient is currently taking spironolactone and fenofibrate and fish oils. They deny any issues with myalgias or history of liver damage from it. They deny any focal numbness or weakness or chest pain.   Relevant past medical, surgical, family and social history reviewed and updated as indicated. Interim medical history since our last visit reviewed. Allergies and medications reviewed and updated.  Review of Systems  Constitutional:  Negative for chills and fever.  Eyes:  Negative for visual disturbance.  Respiratory:  Negative for shortness of breath and wheezing.   Cardiovascular:  Negative for chest pain and leg swelling.  Musculoskeletal:  Negative for back pain and gait problem.  Skin:  Negative for rash.  Neurological:  Negative for dizziness, weakness and light-headedness.  All other systems reviewed and are  negative.  Per HPI unless specifically indicated above   Allergies as of 05/23/2021   No Known Allergies      Medication List        Accurate as of May 23, 2021 10:47 AM. If you have any questions, ask your nurse or doctor.          Accu-Chek Aviva device Test BS BID Dx E11.69   Accu-Chek Aviva Plus test strip Generic drug: glucose blood 1 each by Other route in the morning and at bedtime. Use as instructed   aspirin EC 81 MG tablet Take 81 mg by mouth daily.   atorvastatin 80 MG tablet Commonly known as: LIPITOR Take 1 tablet (80 mg total) by mouth daily at 6 PM.   BD Pen Needle Nano 2nd Gen 32G X 4 MM Misc Generic drug: Insulin Pen Needle Use daily with insulin Dx E11.9, Z79.4   carvedilol 12.5 MG tablet Commonly known as: COREG Take 1 tablet (12.5 mg total) by mouth 2 (two) times daily with a meal.   famotidine 20 MG tablet Commonly known as: Pepcid Take 1 tablet (20 mg total) by mouth 2 (two) times daily.   fenofibrate 145 MG tablet Commonly known as: TRICOR Take 1 tablet (145 mg total) by mouth daily.   Fish Oil 1000 MG Caps Take 2,000 mg by mouth daily.   fluticasone 50 MCG/ACT nasal spray Commonly known as: FLONASE PLACE 1 SPRAY INTO BOTH NOSTRILS DAILY.   furosemide 20 MG tablet Commonly known as: LASIX Take 1 tablet (20 mg  total) by mouth daily.   Jardiance 25 MG Tabs tablet Generic drug: empagliflozin Take 1 tablet (25 mg total) by mouth daily.   losartan 25 MG tablet Commonly known as: COZAAR TAKE 1 TABLET BY MOUTH EVERYDAY AT BEDTIME   potassium chloride 10 MEQ tablet Commonly known as: KLOR-CON Take 1 tablet (10 mEq total) by mouth daily.   sildenafil 20 MG tablet Commonly known as: REVATIO TAKE 1-3 TABLETS BY MOUTH AS NEEDED   spironolactone 25 MG tablet Commonly known as: ALDACTONE TAKE 1/2 TABLET BY MOUTH EVERY DAY   ticagrelor 90 MG Tabs tablet Commonly known as: Brilinta Take 1 tablet (90 mg total) by mouth 2 (two)  times daily.   Trulicity 1.5 YQ/0.3KV Sopn Generic drug: Dulaglutide INJECT 1.5 MG INTO THE SKIN ONCE A WEEK.         Objective:   BP 116/68    Pulse 62    Ht 6' 4.5" (1.943 m)    Wt 271 lb (122.9 kg)    SpO2 97%    BMI 32.56 kg/m   Wt Readings from Last 3 Encounters:  05/23/21 271 lb (122.9 kg)  02/15/21 269 lb (122 kg)  11/14/20 263 lb (119.3 kg)    Physical Exam Vitals and nursing note reviewed.  Constitutional:      General: He is not in acute distress.    Appearance: He is well-developed. He is not diaphoretic.  Eyes:     General: No scleral icterus.    Conjunctiva/sclera: Conjunctivae normal.  Neck:     Thyroid: No thyromegaly.  Cardiovascular:     Rate and Rhythm: Normal rate and regular rhythm.     Heart sounds: Normal heart sounds. No murmur heard. Pulmonary:     Effort: Pulmonary effort is normal. No respiratory distress.     Breath sounds: Normal breath sounds. No wheezing.  Musculoskeletal:        General: No swelling. Normal range of motion.     Cervical back: Neck supple.  Lymphadenopathy:     Cervical: No cervical adenopathy.  Skin:    General: Skin is warm and dry.     Findings: No rash.  Neurological:     Mental Status: He is alert and oriented to person, place, and time.     Coordination: Coordination normal.  Psychiatric:        Behavior: Behavior normal.    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  No change in medicine. Follow up plan: Return in about 3 months (around 08/21/2021), or if symptoms worsen or fail to improve, for Diabetes and hypertension.  Counseling  provided for all of the vaccine components Orders Placed This Encounter  Procedures   Varicella-zoster vaccine IM (Shingrix)   Bayer DCA Hb A1c [place], MD [facility] 05/23/2021, 10:47 AM

## 2021-06-11 DIAGNOSIS — E119 Type 2 diabetes mellitus without complications: Secondary | ICD-10-CM | POA: Diagnosis not present

## 2021-06-11 DIAGNOSIS — S39012A Strain of muscle, fascia and tendon of lower back, initial encounter: Secondary | ICD-10-CM | POA: Diagnosis not present

## 2021-06-11 DIAGNOSIS — I10 Essential (primary) hypertension: Secondary | ICD-10-CM | POA: Diagnosis not present

## 2021-06-11 DIAGNOSIS — M545 Low back pain, unspecified: Secondary | ICD-10-CM | POA: Diagnosis not present

## 2021-06-11 DIAGNOSIS — X58XXXA Exposure to other specified factors, initial encounter: Secondary | ICD-10-CM | POA: Diagnosis not present

## 2021-06-12 ENCOUNTER — Telehealth: Payer: Self-pay

## 2021-06-12 NOTE — Telephone Encounter (Signed)
Transition Care Management Follow-up Telephone Call Date of discharge and from where: 06/11/2021-[facility]  How have you been since you were released from the hospital? Pt stated he is doing fine.  Any questions or concerns? No  Items Reviewed: Did the pt receive and understand the discharge instructions provided? Yes  Medications obtained and verified?  No medications given at discharge Other? No  Any new allergies since your discharge? No  Dietary orders reviewed? No Do you have support at home? Yes   Home Care and Equipment/Supplies: Were home health services ordered? not applicable If so, what is the name of the agency? N/A  Has the agency set up a time to come to the patient's home? not applicable Were any new equipment or medical supplies ordered?  No What is the name of the medical supply agency? N/A Were you able to get the supplies/equipment? not applicable Do you have any questions related to the use of the equipment or supplies? No  Functional Questionnaire: (I = Independent and D = Dependent) ADLs: I  Bathing/Dressing- I  Meal Prep- I  Eating- I  Maintaining continence- I  Transferring/Ambulation- I  Managing Meds- I  Follow up appointments reviewed:  PCP Hospital f/u appt confirmed? No   Specialist Hospital f/u appt confirmed? No   Are transportation arrangements needed? No  If their condition worsens, is the pt aware to call PCP or go to the Emergency Dept.? Yes Was the patient provided with contact information for the PCP's office or ED? Yes Was to pt encouraged to call back with questions or concerns? Yes

## 2021-06-17 ENCOUNTER — Encounter: Payer: Self-pay | Admitting: Family Medicine

## 2021-06-17 ENCOUNTER — Ambulatory Visit (INDEPENDENT_AMBULATORY_CARE_PROVIDER_SITE_OTHER): Payer: Medicaid Other | Admitting: Family Medicine

## 2021-06-17 VITALS — BP 117/81 | HR 72 | Ht 76.5 in | Wt 274.0 lb

## 2021-06-17 DIAGNOSIS — E118 Type 2 diabetes mellitus with unspecified complications: Secondary | ICD-10-CM | POA: Diagnosis not present

## 2021-06-17 DIAGNOSIS — S39012A Strain of muscle, fascia and tendon of lower back, initial encounter: Secondary | ICD-10-CM | POA: Diagnosis not present

## 2021-06-17 DIAGNOSIS — S39012S Strain of muscle, fascia and tendon of lower back, sequela: Secondary | ICD-10-CM

## 2021-06-17 MED ORDER — ACCU-CHEK AVIVA PLUS W/DEVICE KIT: 1.0000 | PACK | Freq: Four times a day (QID) | 1 refills | Status: AC

## 2021-06-17 NOTE — Progress Notes (Signed)
BP 117/81    Pulse 72    Ht 6' 4.5" (1.943 m)    Wt 274 lb (124.3 kg)    SpO2 98%    BMI 32.92 kg/m    Subjective:   Patient ID: Evan [name], male    DOB: Feb 25, 1966, 56 y.o.   MRN: [id number]  HPI: Evan [name] is a 56 y.o. male presenting on 06/17/2021 for Back Pain (Dx with lumbar spasms at [facility])   HPI Patient was in the emergency department on 06/11/2021 for back pain.  He had an x-ray that showed degenerative facet disease of lumbar spine and early spurring but was otherwise normal.  He was given meloxicam and Robaxin.  He says his back pain is completely better, he thinks it was from change in his box spring when he started having issues and now he put a pad on top of it and he is feeling a lot better.  He says he did not even take any of the meloxicam or Robaxin but does have them  Relevant past medical, surgical, family and social history reviewed and updated as indicated. Interim medical history since our last visit reviewed. Allergies and medications reviewed and updated.  Review of Systems  Constitutional:  Negative for chills and fever.  Respiratory:  Negative for shortness of breath and wheezing.   Cardiovascular:  Negative for chest pain and leg swelling.  Musculoskeletal:  Positive for back pain. Negative for gait problem.  Skin:  Negative for rash.  All other systems reviewed and are negative.  Per HPI unless specifically indicated above   Allergies as of 06/17/2021   No Known Allergies      Medication List        Accurate as of June 17, 2021  8:43 AM. If you have any questions, ask your nurse or doctor.          Accu-Chek Aviva device Test BS BID Dx E11.69 What changed: Another medication with the same name was added. Make sure you understand how and when to take each. Changed by: Fransisca Kaufmann Chay Mazzoni, MD   Accu-Chek Aviva Plus w/Device Kit 1 each by Does not apply route 4 (four) times daily. What changed: You were already taking a  medication with the same name, and this prescription was added. Make sure you understand how and when to take each. Changed by: Fransisca Kaufmann Verble Styron, MD   Accu-Chek Aviva Plus test strip Generic drug: glucose blood 1 each by Other route in the morning and at bedtime. Use as instructed   aspirin EC 81 MG tablet Take 81 mg by mouth daily.   atorvastatin 80 MG tablet Commonly known as: LIPITOR Take 1 tablet (80 mg total) by mouth daily at 6 PM.   BD Pen Needle Nano 2nd Gen 32G X 4 MM Misc Generic drug: Insulin Pen Needle Use daily with insulin Dx E11.9, Z79.4   carvedilol 12.5 MG tablet Commonly known as: COREG Take 1 tablet (12.5 mg total) by mouth 2 (two) times daily with a meal.   famotidine 20 MG tablet Commonly known as: Pepcid Take 1 tablet (20 mg total) by mouth 2 (two) times daily.   fenofibrate 145 MG tablet Commonly known as: TRICOR Take 1 tablet (145 mg total) by mouth daily.   Fish Oil 1000 MG Caps Take 2,000 mg by mouth daily.   fluticasone 50 MCG/ACT nasal spray Commonly known as: FLONASE PLACE 1 SPRAY INTO BOTH NOSTRILS DAILY.   furosemide 20  MG tablet Commonly known as: LASIX Take 1 tablet (20 mg total) by mouth daily.   Jardiance 25 MG Tabs tablet Generic drug: empagliflozin Take 1 tablet (25 mg total) by mouth daily.   losartan 25 MG tablet Commonly known as: COZAAR TAKE 1 TABLET BY MOUTH EVERYDAY AT BEDTIME   meloxicam 7.5 MG tablet Commonly known as: MOBIC Take 1 tablet by mouth 2 (two) times daily as needed.   methocarbamol 500 MG tablet Commonly known as: ROBAXIN Take 500 mg by mouth 2 (two) times daily.   potassium chloride 10 MEQ tablet Commonly known as: KLOR-CON Take 1 tablet (10 mEq total) by mouth daily.   sildenafil 20 MG tablet Commonly known as: REVATIO TAKE 1-3 TABLETS BY MOUTH AS NEEDED   spironolactone 25 MG tablet Commonly known as: ALDACTONE TAKE 1/2 TABLET BY MOUTH EVERY DAY   ticagrelor 90 MG Tabs tablet Commonly  known as: Brilinta Take 1 tablet (90 mg total) by mouth 2 (two) times daily.   Trulicity 1.5 GH/8.2XH Sopn Generic drug: Dulaglutide INJECT 1.5 MG INTO THE SKIN ONCE A WEEK.         Objective:   BP 117/81    Pulse 72    Ht 6' 4.5" (1.943 m)    Wt 274 lb (124.3 kg)    SpO2 98%    BMI 32.92 kg/m   Wt Readings from Last 3 Encounters:  06/17/21 274 lb (124.3 kg)  05/23/21 271 lb (122.9 kg)  02/15/21 269 lb (122 kg)    Physical Exam Vitals and nursing note reviewed.  Constitutional:      General: He is not in acute distress.    Appearance: He is well-developed. He is not diaphoretic.  Eyes:     General: No scleral icterus.    Conjunctiva/sclera: Conjunctivae normal.  Neck:     Thyroid: No thyromegaly.  Cardiovascular:     Rate and Rhythm: Normal rate and regular rhythm.     Heart sounds: Normal heart sounds. No murmur heard. Pulmonary:     Effort: Pulmonary effort is normal. No respiratory distress.     Breath sounds: Normal breath sounds. No wheezing.  Musculoskeletal:        General: Normal range of motion.     Cervical back: Neck supple.  Lymphadenopathy:     Cervical: No cervical adenopathy.  Skin:    General: Skin is warm and dry.     Findings: No rash.  Neurological:     Mental Status: He is alert and oriented to person, place, and time.     Coordination: Coordination normal.  Psychiatric:        Behavior: Behavior normal.      Assessment & Plan:   Problem List Items Addressed This Visit       Endocrine   Type 2 diabetes mellitus with complications ([place])   Relevant Medications   Blood Glucose Monitoring Suppl (ACCU-CHEK AVIVA PLUS) w/Device KIT   Other Visit Diagnoses     Strain of lumbar region, sequela    -  Primary       Patient needed new meter.  It seems like his back is doing better and not knee pain today.  Just recommend the same the medicine just as needed in the future Follow up plan: Return if symptoms worsen or fail to  improve.  Counseling provided for all of the vaccine components No orders of the defined types were placed in this encounter.   Caryl Pina, MD [facility] 06/17/2021, 8:43 AM

## 2021-06-18 ENCOUNTER — Other Ambulatory Visit: Payer: Self-pay | Admitting: Family Medicine

## 2021-06-29 ENCOUNTER — Other Ambulatory Visit: Payer: Self-pay | Admitting: Family Medicine

## 2021-06-29 DIAGNOSIS — E119 Type 2 diabetes mellitus without complications: Secondary | ICD-10-CM

## 2021-06-29 DIAGNOSIS — Z794 Long term (current) use of insulin: Secondary | ICD-10-CM

## 2021-08-02 DIAGNOSIS — R1110 Vomiting, unspecified: Secondary | ICD-10-CM | POA: Diagnosis not present

## 2021-08-02 DIAGNOSIS — R0981 Nasal congestion: Secondary | ICD-10-CM | POA: Diagnosis not present

## 2021-08-02 DIAGNOSIS — R197 Diarrhea, unspecified: Secondary | ICD-10-CM | POA: Diagnosis not present

## 2021-08-02 DIAGNOSIS — R630 Anorexia: Secondary | ICD-10-CM | POA: Diagnosis not present

## 2021-08-02 DIAGNOSIS — R519 Headache, unspecified: Secondary | ICD-10-CM | POA: Diagnosis not present

## 2021-08-02 DIAGNOSIS — Z5321 Procedure and treatment not carried out due to patient leaving prior to being seen by health care provider: Secondary | ICD-10-CM | POA: Diagnosis not present

## 2021-08-05 ENCOUNTER — Telehealth: Payer: Self-pay

## 2021-08-05 NOTE — Telephone Encounter (Signed)
Transition Care Management Follow-up Telephone Call ?Date of discharge and from where: 08/02/2021 from [facility] ?How have you been since you were released from the hospital? Patient stated that he is feeling better. He is able to eat and drink without difficulty.  ?Any questions or concerns? No ? ?Items Reviewed: ?Did the pt receive and understand the discharge instructions provided? Yes  ?Medications obtained and verified? Yes  ?Other? No  ?Any new allergies since your discharge? No  ?Dietary orders reviewed? No ?Do you have support at home? Yes  ? ?Functional Questionnaire: (I = Independent and D = Dependent) ?ADLs: I ? ?Bathing/Dressing- I ? ?Meal Prep- I ? ?Eating- I ? ?Maintaining continence- I ? ?Transferring/Ambulation- I ? ?Managing Meds- I ? ? ?Follow up appointments reviewed: ? ?PCP Hospital f/u appt confirmed? No  Scheduled to see Caryl Pina, MD on 08/21/2021 @ 12:55pm. ?[facility] f/u appt confirmed? Yes   ?Are transportation arrangements needed? No  ?If their condition worsens, is the pt aware to call PCP or go to the Emergency Dept.? Yes ?Was the patient provided with contact information for the PCP's office or ED? Yes ?Was to pt encouraged to call back with questions or concerns? Yes ? ?

## 2021-08-19 ENCOUNTER — Other Ambulatory Visit: Payer: Self-pay | Admitting: Family Medicine

## 2021-08-19 DIAGNOSIS — I152 Hypertension secondary to endocrine disorders: Secondary | ICD-10-CM

## 2021-08-19 DIAGNOSIS — L84 Corns and callosities: Secondary | ICD-10-CM

## 2021-08-21 ENCOUNTER — Ambulatory Visit (INDEPENDENT_AMBULATORY_CARE_PROVIDER_SITE_OTHER): Payer: Medicaid Other | Admitting: Family Medicine

## 2021-08-21 ENCOUNTER — Encounter: Payer: Self-pay | Admitting: Family Medicine

## 2021-08-21 VITALS — BP 124/81 | HR 84 | Ht 76.5 in | Wt 262.0 lb

## 2021-08-21 DIAGNOSIS — E1169 Type 2 diabetes mellitus with other specified complication: Secondary | ICD-10-CM | POA: Diagnosis not present

## 2021-08-21 DIAGNOSIS — I152 Hypertension secondary to endocrine disorders: Secondary | ICD-10-CM

## 2021-08-21 DIAGNOSIS — E118 Type 2 diabetes mellitus with unspecified complications: Secondary | ICD-10-CM | POA: Diagnosis not present

## 2021-08-21 DIAGNOSIS — E1159 Type 2 diabetes mellitus with other circulatory complications: Secondary | ICD-10-CM

## 2021-08-21 DIAGNOSIS — L84 Corns and callosities: Secondary | ICD-10-CM | POA: Diagnosis not present

## 2021-08-21 DIAGNOSIS — E785 Hyperlipidemia, unspecified: Secondary | ICD-10-CM | POA: Diagnosis not present

## 2021-08-21 DIAGNOSIS — E11628 Type 2 diabetes mellitus with other skin complications: Secondary | ICD-10-CM | POA: Diagnosis not present

## 2021-08-21 DIAGNOSIS — Z23 Encounter for immunization: Secondary | ICD-10-CM

## 2021-08-21 LAB — BAYER DCA HB A1C WAIVED: HB A1C (BAYER DCA - WAIVED): 6.6 % — ABNORMAL HIGH (ref 4.8–5.6)

## 2021-08-21 MED ORDER — FAMOTIDINE 20 MG PO TABS: 20.0000 mg | ORAL_TABLET | Freq: Two times a day (BID) | ORAL | 3 refills | Status: DC

## 2021-08-21 MED ORDER — TRULICITY 1.5 MG/0.5ML ~~LOC~~ SOAJ: 1.5000 mg | SUBCUTANEOUS | 3 refills | Status: DC

## 2021-08-21 MED ORDER — FLUTICASONE PROPIONATE 50 MCG/ACT NA SUSP: 1.0000 | Freq: Every day | NASAL | 11 refills | Status: DC

## 2021-08-21 NOTE — Progress Notes (Signed)
? ?BP 124/81   Pulse 84   Ht 6' 4.5" (1.943 m)   Wt 262 lb (118.8 kg)   SpO2 99%   BMI 31.48 kg/m?   ? ?Subjective:  ? ?Patient ID: Evan [name], male    DOB: 1965/12/18, 56 y.o.   MRN: [id number] ? ?HPI: ?Evan [name] is a 56 y.o. male presenting on 08/21/2021 for Medical Management of Chronic Issues, Diabetes, Hyperlipidemia, and Hypertension ? ? ?HPI ?Type 2 diabetes mellitus ?Patient comes in today for recheck of his diabetes. Patient has been currently taking Jardiance and Trulicity. Patient is currently on an ACE inhibitor/ARB. Patient has not seen an ophthalmologist this year. Patient denies any issues with their feet. The symptom started onset as an adult hypertension and hyperlipidemia ARE RELATED TO DM  ? ?Hypertension ?Patient is currently on carvedilol and furosemide and losartan and spironolactone, and their blood pressure today is 124/81. Patient denies any lightheadedness or dizziness. Patient denies headaches, blurred vision, chest pains, shortness of breath, or weakness. Denies any side effects from medication and is content with current medication.  ? ?Hyperlipidemia ?Patient is coming in for recheck of his hyperlipidemia. The patient is currently taking fenofibrate and atorvastatin. They deny any issues with myalgias or history of liver damage from it. They deny any focal numbness or weakness or chest pain.  ? ?Patient has right knee osteoarthritis that still bothers him but he is doing okay with it.  He does know he needs a knee replacement at some point but does not want to do it yet ? ?Relevant past medical, surgical, family and social history reviewed and updated as indicated. Interim medical history since our last visit reviewed. ?Allergies and medications reviewed and updated. ? ?Review of Systems  ?Constitutional:  Negative for chills and fever.  ?Respiratory:  Negative for shortness of breath and wheezing.   ?Cardiovascular:  Negative for chest pain and leg swelling.   ?Musculoskeletal:  Positive for arthralgias. Negative for back pain and gait problem.  ?Skin:  Negative for rash.  ?Neurological:  Negative for dizziness, weakness and light-headedness.  ?All other systems reviewed and are negative. ? ?Per HPI unless specifically indicated above ? ? ?Allergies as of 08/21/2021   ?No Known Allergies ?  ? ?  ?Medication List  ?  ? ?  ? Accurate as of August 21, 2021  1:37 PM. If you have any questions, ask your nurse or doctor.  ?  ?  ? ?  ? ?Accu-Chek Aviva device ?Test BS BID Dx E11.69 ?  ?Accu-Chek Aviva Plus w/Device Kit ?1 each by Does not apply route 4 (four) times daily. ?  ?Accu-Chek Guide test strip ?Generic drug: glucose blood ?Check BS in the morning and at bedtime Dx E11.8 ?  ?aspirin EC 81 MG tablet ?Take 81 mg by mouth daily. ?  ?atorvastatin 80 MG tablet ?Commonly known as: LIPITOR ?Take 1 tablet (80 mg total) by mouth daily at 6 PM. ?  ?BD Pen Needle Nano 2nd Gen 32G X 4 MM Misc ?Generic drug: Insulin Pen Needle ?Use daily with insulin Dx E11.9, Z79.4 ?  ?carvedilol 12.5 MG tablet ?Commonly known as: COREG ?TAKE 1 TABLET (12.5 MG TOTAL) BY MOUTH 2 (TWO) TIMES DAILY WITH A MEAL. ?  ?famotidine 20 MG tablet ?Commonly known as: Pepcid ?Take 1 tablet (20 mg total) by mouth 2 (two) times daily. ?  ?fenofibrate 145 MG tablet ?Commonly known as: TRICOR ?Take 1 tablet (145 mg total) by mouth daily. ?  ?  Fish Oil 1000 MG Caps ?Take 2,000 mg by mouth daily. ?  ?fluticasone 50 MCG/ACT nasal spray ?Commonly known as: FLONASE ?Place 1 spray into both nostrils daily. ?  ?furosemide 20 MG tablet ?Commonly known as: LASIX ?TAKE 1 TABLET BY MOUTH EVERY DAY ?  ?Jardiance 25 MG Tabs tablet ?Generic drug: empagliflozin ?TAKE 1 TABLET (25 MG TOTAL) BY MOUTH DAILY. ?  ?losartan 25 MG tablet ?Commonly known as: COZAAR ?TAKE 1 TABLET BY MOUTH EVERYDAY AT BEDTIME ?  ?meloxicam 7.5 MG tablet ?Commonly known as: MOBIC ?Take 1 tablet by mouth 2 (two) times daily as needed. ?  ?methocarbamol 500 MG  tablet ?Commonly known as: ROBAXIN ?Take 500 mg by mouth 2 (two) times daily. ?  ?potassium chloride 10 MEQ tablet ?Commonly known as: KLOR-CON ?Take 1 tablet (10 mEq total) by mouth daily. ?  ?sildenafil 20 MG tablet ?Commonly known as: REVATIO ?TAKE 1-3 TABLETS BY MOUTH AS NEEDED ?  ?spironolactone 25 MG tablet ?Commonly known as: ALDACTONE ?TAKE 1/2 TABLET BY MOUTH EVERY DAY ?  ?ticagrelor 90 MG Tabs tablet ?Commonly known as: Brilinta ?Take 1 tablet (90 mg total) by mouth 2 (two) times daily. ?  ?Trulicity 1.5 MH/9.6QI Sopn ?Generic drug: Dulaglutide ?Inject 1.5 mg into the skin once a week. ?  ? ?  ? ? ? ?Objective:  ? ?BP 124/81   Pulse 84   Ht 6' 4.5" (1.943 m)   Wt 262 lb (118.8 kg)   SpO2 99%   BMI 31.48 kg/m?   ?Wt Readings from Last 3 Encounters:  ?08/21/21 262 lb (118.8 kg)  ?06/17/21 274 lb (124.3 kg)  ?05/23/21 271 lb (122.9 kg)  ?  ?Physical Exam ?Vitals and nursing note reviewed.  ?Constitutional:   ?   General: He is not in acute distress. ?   Appearance: He is well-developed. He is not diaphoretic.  ?Eyes:  ?   General: No scleral icterus. ?   Conjunctiva/sclera: Conjunctivae normal.  ?Neck:  ?   Thyroid: No thyromegaly.  ?Cardiovascular:  ?   Rate and Rhythm: Normal rate and regular rhythm.  ?   Heart sounds: Normal heart sounds. No murmur heard. ?Pulmonary:  ?   Effort: Pulmonary effort is normal. No respiratory distress.  ?   Breath sounds: Normal breath sounds. No wheezing.  ?Musculoskeletal:     ?   General: No swelling. Normal range of motion.  ?   Cervical back: Neck supple.  ?Lymphadenopathy:  ?   Cervical: No cervical adenopathy.  ?Skin: ?   General: Skin is warm and dry.  ?   Findings: No rash.  ?Neurological:  ?   Mental Status: He is alert and oriented to person, place, and time.  ?   Coordination: Coordination normal.  ?Psychiatric:     ?   Behavior: Behavior normal.  ? ? ? ? ?Assessment & Plan:  ? ?Problem List Items Addressed This Visit   ? ?  ? Cardiovascular and Mediastinum  ?  Hypertension associated with diabetes ([place])  ? Relevant Medications  ? Dulaglutide (TRULICITY) 1.5 WL/7.9GX SOPN  ? Other Relevant Orders  ? CBC with Differential/Platelet  ? CMP14+EGFR  ? Lipid panel  ? Bayer DCA Hb A1c Waived  ?  ? Endocrine  ? 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)  ? Relevant Medications  ? Dulaglutide (TRULICITY) 1.5 QJ/1.9ER SOPN  ? Hyperlipidemia associated with type 2 diabetes mellitus ([place])  ? Relevant Medications  ? Dulaglutide (TRULICITY) 1.5 DE/0.8XK SOPN  ?  Other Relevant Orders  ? CBC with Differential/Platelet  ? CMP14+EGFR  ? Lipid panel  ? Bayer DCA Hb A1c Waived  ? Type 2 diabetes mellitus with complications ([facility]) - Primary  ? Relevant Medications  ? Dulaglutide (TRULICITY) 1.5 ER/8.4XQ SOPN  ? Other Relevant Orders  ? CBC with Differential/Platelet  ? CMP14+EGFR  ? Lipid panel  ? Bayer DCA Hb A1c Waived  ? ?Other Visit Diagnoses   ? ? Need for shingles vaccine      ? Relevant Orders  ? Varicella-zoster vaccine IM (Shingrix) (Completed)  ? ?  ?  ?A1c 6.6 again today, looks good, no changes. ? ?Blood pressure looks good continue current medicine ?Follow up plan: ?Return in about 3 months (around 11/20/2021), or if symptoms worsen or fail to improve, for Diabetes hypertension and cholesterol. ? ?Counseling provided for all of the vaccine components ?Orders Placed This Encounter  ?Procedures  ? Varicella-zoster vaccine IM (Shingrix)  ? CBC with Differential/Platelet  ? CMP14+EGFR  ? Lipid panel  ? Bayer DCA Hb A1c Waived  ? ? ?Caryl Pina, MD ?[place] ?08/21/2021, 1:37 PM ? ? ?  ?

## 2021-08-22 LAB — CMP14+EGFR
ALT: 18 IU/L (ref 0–44)
AST: 15 IU/L (ref 0–40)
Albumin/Globulin Ratio: 1.8 (ref 1.2–2.2)
Albumin: 4.3 g/dL (ref 3.8–4.9)
Alkaline Phosphatase: 73 IU/L (ref 44–121)
BUN/Creatinine Ratio: 13 (ref 9–20)
BUN: 15 mg/dL (ref 6–24)
Bilirubin Total: 0.3 mg/dL (ref 0.0–1.2)
CO2: 25 mmol/L (ref 20–29)
Calcium: 9.3 mg/dL (ref 8.7–10.2)
Chloride: 105 mmol/L (ref 96–106)
Creatinine, Ser: 1.19 mg/dL (ref 0.76–1.27)
Globulin, Total: 2.4 g/dL (ref 1.5–4.5)
Glucose: 152 mg/dL — ABNORMAL HIGH (ref 70–99)
Potassium: 4.1 mmol/L (ref 3.5–5.2)
Sodium: 145 mmol/L — ABNORMAL HIGH (ref 134–144)
Total Protein: 6.7 g/dL (ref 6.0–8.5)
eGFR: 72 mL/min/{1.73_m2} (ref >59)

## 2021-08-22 LAB — CBC WITH DIFFERENTIAL/PLATELET
Basophils Absolute: 0.1 10*3/uL (ref 0.0–0.2)
Basos: 1 %
EOS (ABSOLUTE): 0.3 10*3/uL (ref 0.0–0.4)
Eos: 4 %
Hematocrit: 44.2 % (ref 37.5–51.0)
Hemoglobin: 14.4 g/dL (ref 13.0–17.7)
Immature Grans (Abs): 0 10*3/uL (ref 0.0–0.1)
Immature Granulocytes: 0 %
Lymphocytes Absolute: 2 10*3/uL (ref 0.7–3.1)
Lymphs: 23 %
MCH: 29.4 pg (ref 26.6–33.0)
MCHC: 32.6 g/dL (ref 31.5–35.7)
MCV: 90 fL (ref 79–97)
Monocytes Absolute: 0.7 10*3/uL (ref 0.1–0.9)
Monocytes: 8 %
Neutrophils Absolute: 5.7 10*3/uL (ref 1.4–7.0)
Neutrophils: 64 %
Platelets: 263 10*3/uL (ref 150–450)
RBC: 4.89 x10E6/uL (ref 4.14–5.80)
RDW: 12.3 % (ref 11.6–15.4)
WBC: 8.8 10*3/uL (ref 3.4–10.8)

## 2021-08-22 LAB — LIPID PANEL
Chol/HDL Ratio: 3.4 ratio (ref 0.0–5.0)
Cholesterol, Total: 114 mg/dL (ref 100–199)
HDL: 34 mg/dL — ABNORMAL LOW (ref >39)
LDL Chol Calc (NIH): 53 mg/dL (ref 0–99)
Triglycerides: 160 mg/dL — ABNORMAL HIGH (ref 0–149)
VLDL Cholesterol Cal: 27 mg/dL (ref 5–40)

## 2021-09-18 ENCOUNTER — Encounter: Payer: Self-pay | Admitting: Cardiovascular Disease

## 2021-09-18 ENCOUNTER — Ambulatory Visit: Payer: Medicaid Other | Admitting: Cardiovascular Disease

## 2021-09-18 DIAGNOSIS — I252 Old myocardial infarction: Secondary | ICD-10-CM | POA: Diagnosis not present

## 2021-09-18 DIAGNOSIS — I152 Hypertension secondary to endocrine disorders: Secondary | ICD-10-CM | POA: Diagnosis not present

## 2021-09-18 DIAGNOSIS — E1159 Type 2 diabetes mellitus with other circulatory complications: Secondary | ICD-10-CM

## 2021-09-18 DIAGNOSIS — I255 Ischemic cardiomyopathy: Secondary | ICD-10-CM | POA: Diagnosis not present

## 2021-09-18 MED ORDER — CLOPIDOGREL BISULFATE 75 MG PO TABS: 75.0000 mg | ORAL_TABLET | Freq: Every day | ORAL | 3 refills | Status: DC

## 2021-09-18 NOTE — Progress Notes (Signed)
? ? ? ?09/18/2021 ?[name]   ?[birth year]  ?[id number] ? ?Primary Physician Dettinger, Fransisca Kaufmann, MD ?Primary Cardiologist: Lorretta Harp MD [name], [place] ? ?HPI:  Evan [name] is a 56 y.o.  moderate to severely overweight divorced Caucasian male father of 2, grandfather of 2 grandchildren, 1 of whom is partially disabled.  His primary care provider is Dr. Warrick Parisian.  I last saw him in the office 09/23/2019. His risk factors include treated hypertension hyperlipidemia as well as family history.  He had a delayed presentation of anterolateral myocardial infarction 11/24/2018 and had intervention by Dr. Fletcher Anon of his proximal LAD.  He had staged RCA intervention several days later.  His EF was in the 20 to 25% range by 2D echo and he was discharged home on appropriate pharmacology and a LifeVest.  He is making lifestyle changes, has lost weight and is eating a more healthy diet. ?  ? He is mowing lawns, working out and walk frequently without limitation.  His most recent 2D echocardiogram performed after pharmacologic therapy for LV dysfunction on 02/23/2019 revealed an improvement in his EF from 20 to 25% up to 35 to 40%.  Based on this I suggested that we discontinue the LifeVest.  He says that his lifestyle change.  Is eating habits have as well.  His most recent lipid profile performed 07/14/2019 revealed total cholesterol 132, LDL 72 HDL 34. ?  ?Since I saw him in the office 2 years ago he did see [name], [name] in February 2022.  He is remained clinically stable.  He denies chest pain or shortness of breath.  His LV function has plateaued at 35 to 40% but by 2D echo performed 08/23/2020. ? ?Current Meds  ?Medication Sig  ? aspirin EC 81 MG tablet Take 81 mg by mouth daily.  ? atorvastatin (LIPITOR) 80 MG tablet Take 1 tablet (80 mg total) by mouth daily at 6 PM.  ? BD PEN NEEDLE NANO 2ND GEN 32G X 4 MM MISC Use daily with insulin Dx E11.9, Z79.4  ? Blood Glucose Monitoring Suppl (ACCU-CHEK AVIVA  PLUS) w/Device KIT 1 each by Does not apply route 4 (four) times daily.  ? Blood Glucose Monitoring Suppl (ACCU-CHEK AVIVA) device Test BS BID Dx E11.69  ? carvedilol (COREG) 12.5 MG tablet TAKE 1 TABLET (12.5 MG TOTAL) BY MOUTH 2 (TWO) TIMES DAILY WITH A MEAL.  ? clopidogrel (PLAVIX) 75 MG tablet Take 1 tablet (75 mg total) by mouth daily.  ? Dulaglutide (TRULICITY) 1.5 DD/2.2GU SOPN Inject 1.5 mg into the skin once a week.  ? famotidine (PEPCID) 20 MG tablet Take 1 tablet (20 mg total) by mouth 2 (two) times daily.  ? fenofibrate (TRICOR) 145 MG tablet Take 1 tablet (145 mg total) by mouth daily.  ? fluticasone (FLONASE) 50 MCG/ACT nasal spray Place 1 spray into both nostrils daily.  ? furosemide (LASIX) 20 MG tablet TAKE 1 TABLET BY MOUTH EVERY DAY  ? glucose blood (ACCU-CHEK GUIDE) test strip Check BS in the morning and at bedtime Dx E11.8  ? JARDIANCE 25 MG TABS tablet TAKE 1 TABLET (25 MG TOTAL) BY MOUTH DAILY.  ? losartan (COZAAR) 25 MG tablet TAKE 1 TABLET BY MOUTH EVERYDAY AT BEDTIME  ? Omega-3 Fatty Acids (FISH OIL) 1000 MG CAPS Take 2,000 mg by mouth daily.  ? potassium chloride (KLOR-CON) 10 MEQ tablet Take 1 tablet (10 mEq total) by mouth daily.  ? sildenafil (REVATIO) 20 MG tablet TAKE 1-3  TABLETS BY MOUTH AS NEEDED  ? spironolactone (ALDACTONE) 25 MG tablet TAKE 1/2 TABLET BY MOUTH EVERY DAY (Patient taking differently: Take 12.5 mg by mouth daily.)  ? [DISCONTINUED] ticagrelor (BRILINTA) 90 MG TABS tablet Take 1 tablet (90 mg total) by mouth 2 (two) times daily.  ?  ? ?No Known Allergies ? ?Social History  ? ?Socioeconomic History  ? Marital status: Single  ?  Spouse name: Not on file  ? Number of children: Not on file  ? Years of education: Not on file  ? Highest education level: Not on file  ?Occupational History  ? Not on file  ?Tobacco Use  ? Smoking status: Never  ? Smokeless tobacco: Never  ?Vaping Use  ? Vaping Use: Never used  ?Substance and Sexual Activity  ? Alcohol use: Yes  ?   Alcohol/week: 12.0 standard drinks  ?  Types: 12 Cans of beer per week  ?  Comment: 5 beers a week  ? Drug use: No  ? Sexual activity: Yes  ?  Comment: male 8 months partner  ?Other Topics Concern  ? Not on file  ?Social History Narrative  ? Not on file  ? ?Social Determinants of Health  ? ?Financial Resource Strain: Not on file  ?Food Insecurity: Not on file  ?Transportation Needs: Not on file  ?Physical Activity: Not on file  ?Stress: Not on file  ?Social Connections: Not on file  ?Intimate Partner Violence: Not on file  ?  ? ?Review of Systems: ?General: negative for chills, fever, night sweats or weight changes.  ?Cardiovascular: negative for chest pain, dyspnea on exertion, edema, orthopnea, palpitations, paroxysmal nocturnal dyspnea or shortness of breath ?Dermatological: negative for rash ?Respiratory: negative for cough or wheezing ?Urologic: negative for hematuria ?Abdominal: negative for nausea, vomiting, diarrhea, bright red blood per rectum, melena, or hematemesis ?Neurologic: negative for visual changes, syncope, or dizziness ?All other systems reviewed and are otherwise negative except as noted above. ? ? ? ?Blood pressure 101/70, pulse 68, height '6\' 4"'  (1.93 m), weight 260 lb 6.4 oz (118.1 kg), SpO2 95 %.  ?General appearance: alert and no distress ?Neck: no adenopathy, no carotid bruit, no JVD, supple, symmetrical, trachea midline, and thyroid not enlarged, symmetric, no tenderness/mass/nodules ?Lungs: clear to auscultation bilaterally ?Heart: regular rate and rhythm, S1, S2 normal, no murmur, click, rub or gallop ?Extremities: extremities normal, atraumatic, no cyanosis or edema ?Pulses: 2+ and symmetric ?Skin: Skin color, texture, turgor normal. No rashes or lesions ?Neurologic: Grossly normal ? ?EKG sinus rhythm at 68 with Q waves across the precordium consistent with an old anterior lateral/anteroseptal myocardial infarction with low limb voltage.  I personally reviewed this EKG. ? ?ASSESSMENT  AND PLAN:  ? ?Hypertension associated with diabetes ([place]) ?History of essential hypertension blood pressure measured today at 101/70.  He is on carvedilol, losartan and spironolactone. ? ?Hyperlipidemia associated with type 2 diabetes mellitus ([place]) ?History of hyperlipidemia on statin therapy with lipid profile performed 08/21/2021 revealing total cholesterol 114, LDL 53 and HDL 34. ? ?History of non-ST elevation myocardial infarction (NSTEMI) ?History of CAD status post delayed presentation anterior STEMI 11/24/2018 with intervention by Dr. Fletcher Anon on his proximal LAD.  He had staged intervention on his RCA several days later.  He has remained on aspirin and Brilinta.  He denies chest pain.  I am going to discontinue his Brilinta and begin him on clopidogrel 75 mg a day. ? ?Ischemic cardiomyopathy ?History of ischemic cardiomyopathy with an EF that has remained in  the 35 to 40% range by his most recent 2D echo performed/7/22.  He is on guideline directed optimal medical therapy and is asymptomatic. ? ? ? ? ?Lorretta Harp MD FACP,FACC,FAHA, FSCAI ?09/18/2021 ?9:54 AM ?

## 2021-09-18 NOTE — Patient Instructions (Signed)
Medication Instructions:  ? ?STOP BRILINTA ? ?START CLOPIDOGREL 75 MG ONCE DAILY ?  ?*If you need a refill on your cardiac medications before your next appointment, please call your pharmacy* ? ? ?Follow-Up: ?At [facility], you and your health needs are our priority.  As part of our continuing mission to provide you with exceptional heart care, we have created designated Provider Care Teams.  These Care Teams include your primary Cardiologist (physician) and Advanced Practice Providers (APPs -  Physician Assistants and Nurse Practitioners) who all work together to provide you with the care you need, when you need it. ? ?We recommend signing up for the patient portal called "MyChart".  Sign up information is provided on this After Visit Summary.  MyChart is used to connect with patients for Virtual Visits (Telemedicine).  Patients are able to view lab/test results, encounter notes, upcoming appointments, etc.  Non-urgent messages can be sent to your provider as well.   ?To learn more about what you can do with MyChart, go to [email or web address].   ? ?Your next appointment:   ?12 month(s) ? ?The format for your next appointment:   ?In Person ? ?Provider:   ?Quay Burow, MD   ? ? ? ? ?Important Information About Sugar ? ? ? ? ?  ?

## 2021-09-18 NOTE — Assessment & Plan Note (Signed)
History of hyperlipidemia on statin therapy with lipid profile performed 08/21/2021 revealing total cholesterol 114, LDL 53 and HDL 34. ?

## 2021-09-18 NOTE — Assessment & Plan Note (Addendum)
History of CAD status post delayed presentation anterior STEMI 11/24/2018 with intervention by Dr. Fletcher Anon on his proximal LAD.  He had staged intervention on his RCA several days later.  He has remained on aspirin and Brilinta.  He denies chest pain.  I am going to discontinue his Brilinta and begin him on clopidogrel 75 mg a day. ?

## 2021-09-18 NOTE — Assessment & Plan Note (Signed)
History of essential hypertension blood pressure measured today at 101/70.  He is on carvedilol, losartan and spironolactone. ?

## 2021-09-18 NOTE — Assessment & Plan Note (Signed)
History of ischemic cardiomyopathy with an EF that has remained in the 35 to 40% range by his most recent 2D echo performed/7/22.  He is on guideline directed optimal medical therapy and is asymptomatic. ?

## 2021-09-28 ENCOUNTER — Other Ambulatory Visit: Payer: Self-pay | Admitting: Cardiovascular Disease

## 2021-10-25 ENCOUNTER — Other Ambulatory Visit: Payer: Self-pay | Admitting: Family Medicine

## 2021-11-10 ENCOUNTER — Encounter: Payer: Self-pay | Admitting: Gastroenterology

## 2021-11-15 ENCOUNTER — Ambulatory Visit: Payer: Medicaid Other | Admitting: Family Medicine

## 2021-11-15 ENCOUNTER — Encounter: Payer: Self-pay | Admitting: Family Medicine

## 2021-11-15 VITALS — BP 114/72 | HR 84 | Temp 98.4°F | Ht 76.0 in | Wt 255.0 lb

## 2021-11-15 DIAGNOSIS — E118 Type 2 diabetes mellitus with unspecified complications: Secondary | ICD-10-CM

## 2021-11-15 DIAGNOSIS — E1169 Type 2 diabetes mellitus with other specified complication: Secondary | ICD-10-CM | POA: Diagnosis not present

## 2021-11-15 DIAGNOSIS — L84 Corns and callosities: Secondary | ICD-10-CM | POA: Diagnosis not present

## 2021-11-15 DIAGNOSIS — E1159 Type 2 diabetes mellitus with other circulatory complications: Secondary | ICD-10-CM | POA: Diagnosis not present

## 2021-11-15 DIAGNOSIS — E785 Hyperlipidemia, unspecified: Secondary | ICD-10-CM | POA: Diagnosis not present

## 2021-11-15 DIAGNOSIS — E11628 Type 2 diabetes mellitus with other skin complications: Secondary | ICD-10-CM

## 2021-11-15 DIAGNOSIS — I152 Hypertension secondary to endocrine disorders: Secondary | ICD-10-CM

## 2021-11-15 LAB — BAYER DCA HB A1C WAIVED: HB A1C (BAYER DCA - WAIVED): 6 % — ABNORMAL HIGH (ref 4.8–5.6)

## 2021-11-15 MED ORDER — SILDENAFIL CITRATE 20 MG PO TABS: 20.0000 mg | ORAL_TABLET | Freq: Every day | ORAL | 1 refills | Status: DC | PRN

## 2021-11-15 NOTE — Progress Notes (Signed)
BP 114/72   Pulse 84   Temp 98.4 F (36.9 C)   Ht 6' 4" (1.93 m)   Wt 255 lb (115.7 kg)   SpO2 99%   BMI 31.04 kg/m    Subjective:   Patient ID: Evan [name], male    DOB: 1965-11-23, 56 y.o.   MRN: [id number]  HPI: Evan [name] is a 56 y.o. male presenting on 11/15/2021 for Medical Management of Chronic Issues   HPI Type 2 diabetes mellitus Patient comes in today for recheck of his diabetes. Patient has been currently taking Jardiance. Patient is currently on an ACE inhibitor/ARB. Patient has seen an ophthalmologist this year. Patient denies any issues with their feet. The symptom started onset as an adult hypertension and hyperlipidemia ARE RELATED TO DM   Hypertension Patient is currently on carvedilol and furosemide and losartan spironolactone, and their blood pressure today is 114/72. Patient denies any lightheadedness or dizziness. Patient denies headaches, blurred vision, chest pains, shortness of breath, or weakness. Denies any side effects from medication and is content with current medication.   Hyperlipidemia Patient is coming in for recheck of his hyperlipidemia. The patient is currently taking atorvastatin and fenofibrate and fish oils. They deny any issues with myalgias or history of liver damage from it. They deny any focal numbness or weakness or chest pain.   Relevant past medical, surgical, family and social history reviewed and updated as indicated. Interim medical history since our last visit reviewed. Allergies and medications reviewed and updated.  Review of Systems  Constitutional:  Negative for chills and fever.  Eyes:  Negative for visual disturbance.  Respiratory:  Negative for shortness of breath and wheezing.   Cardiovascular:  Negative for chest pain and leg swelling.  Musculoskeletal:  Negative for back pain and gait problem.  Skin:  Negative for rash.  All other systems reviewed and are negative.   Per HPI unless specifically indicated  above   Allergies as of 11/15/2021   No Known Allergies      Medication List        Accurate as of November 15, 2021 12:01 PM. If you have any questions, ask your nurse or doctor.          Accu-Chek Aviva device Test BS BID Dx E11.69   Accu-Chek Aviva Plus w/Device Kit 1 each by Does not apply route 4 (four) times daily.   Accu-Chek Guide test strip Generic drug: glucose blood Check BS in the morning and at bedtime Dx E11.8   aspirin EC 81 MG tablet Take 81 mg by mouth daily.   atorvastatin 80 MG tablet Commonly known as: LIPITOR Take 1 tablet (80 mg total) by mouth daily at 6 PM.   BD Pen Needle Nano 2nd Gen 32G X 4 MM Misc Generic drug: Insulin Pen Needle Use daily with insulin Dx E11.9, Z79.4   carvedilol 12.5 MG tablet Commonly known as: COREG TAKE 1 TABLET (12.5 MG TOTAL) BY MOUTH 2 (TWO) TIMES DAILY WITH A MEAL.   clopidogrel 75 MG tablet Commonly known as: PLAVIX Take 1 tablet (75 mg total) by mouth daily.   famotidine 20 MG tablet Commonly known as: Pepcid Take 1 tablet (20 mg total) by mouth 2 (two) times daily.   fenofibrate 145 MG tablet Commonly known as: TRICOR Take 1 tablet (145 mg total) by mouth daily.   Fish Oil 1000 MG Caps Take 2,000 mg by mouth daily.   fluticasone 50 MCG/ACT nasal spray Commonly known as:  FLONASE Place 1 spray into both nostrils daily.   furosemide 20 MG tablet Commonly known as: LASIX TAKE 1 TABLET BY MOUTH EVERY DAY   Jardiance 25 MG Tabs tablet Generic drug: empagliflozin TAKE 1 TABLET (25 MG TOTAL) BY MOUTH DAILY.   losartan 25 MG tablet Commonly known as: COZAAR TAKE 1 TABLET BY MOUTH EVERYDAY AT BEDTIME   meloxicam 7.5 MG tablet Commonly known as: MOBIC Take 1 tablet by mouth 2 (two) times daily as needed.   methocarbamol 500 MG tablet Commonly known as: ROBAXIN Take 500 mg by mouth 2 (two) times daily.   potassium chloride 10 MEQ tablet Commonly known as: KLOR-CON Take 1 tablet (10 mEq total)  by mouth daily.   sildenafil 20 MG tablet Commonly known as: REVATIO Take 1-5 tablets (20-100 mg total) by mouth daily as needed. What changed: See the new instructions. Changed by: Joshua A Dettinger, MD   spironolactone 25 MG tablet Commonly known as: ALDACTONE TAKE 1/2 TABLET BY MOUTH EVERY DAY   Trulicity 1.5 MG/0.5ML Sopn Generic drug: Dulaglutide Inject 1.5 mg into the skin once a week.         Objective:   BP 114/72   Pulse 84   Temp 98.4 F (36.9 C)   Ht 6' 4" (1.93 m)   Wt 255 lb (115.7 kg)   SpO2 99%   BMI 31.04 kg/m   Wt Readings from Last 3 Encounters:  11/15/21 255 lb (115.7 kg)  09/18/21 260 lb 6.4 oz (118.1 kg)  08/21/21 262 lb (118.8 kg)    Physical Exam Vitals and nursing note reviewed.  Constitutional:      General: He is not in acute distress.    Appearance: He is well-developed. He is not diaphoretic.  Eyes:     General: No scleral icterus.    Conjunctiva/sclera: Conjunctivae normal.  Neck:     Thyroid: No thyromegaly.  Cardiovascular:     Rate and Rhythm: Normal rate and regular rhythm.     Heart sounds: Normal heart sounds. No murmur heard. Pulmonary:     Effort: Pulmonary effort is normal. No respiratory distress.     Breath sounds: Normal breath sounds. No wheezing.  Musculoskeletal:        General: Normal range of motion.     Cervical back: Neck supple.  Lymphadenopathy:     Cervical: No cervical adenopathy.  Skin:    General: Skin is warm and dry.     Findings: No rash.  Neurological:     Mental Status: He is alert and oriented to person, place, and time.     Coordination: Coordination normal.  Psychiatric:        Behavior: Behavior normal.     Diabetic Foot Exam - Simple   Simple Foot Form Diabetic Foot exam was performed with the following findings: Yes 11/15/2021 11:59 AM  Visual Inspection No deformities, no ulcerations, no other skin breakdown bilaterally: Yes Sensation Testing Intact to touch and monofilament  testing bilaterally: Yes Pulse Check Posterior Tibialis and Dorsalis pulse intact bilaterally: Yes Comments Has some calluses on the floor pad of both feet proximal to the great toe      Assessment & Plan:   Problem List Items Addressed This Visit       Cardiovascular and Mediastinum   Hypertension associated with diabetes (HCC)   Relevant Medications   sildenafil (REVATIO) 20 MG tablet     Endocrine   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)     Hyperlipidemia associated with type 2 diabetes mellitus (HCC)   Relevant Medications   sildenafil (REVATIO) 20 MG tablet   Type 2 diabetes mellitus with complications (HCC) - Primary   Relevant Orders   Bayer DCA Hb A1c Waived    A1c looks good at 6.0, no changes seems to be doing well.  Continue current medicine   Follow up plan: Return in about 3 months (around 02/15/2022), or if symptoms worsen or fail to improve, for Diabetes and hypertension and cholesterol.  Counseling provided for all of the vaccine components Orders Placed This Encounter  Procedures   Bayer DCA Hb A1c Waived    Joshua Dettinger, MD [facility] 11/15/2021, 12:01 PM     

## 2021-11-21 ENCOUNTER — Other Ambulatory Visit: Payer: Self-pay | Admitting: Family Medicine

## 2021-11-21 DIAGNOSIS — E11628 Type 2 diabetes mellitus with other skin complications: Secondary | ICD-10-CM

## 2022-01-19 ENCOUNTER — Other Ambulatory Visit: Payer: Self-pay | Admitting: Family Medicine

## 2022-01-19 DIAGNOSIS — E1169 Type 2 diabetes mellitus with other specified complication: Secondary | ICD-10-CM

## 2022-01-19 DIAGNOSIS — E11628 Type 2 diabetes mellitus with other skin complications: Secondary | ICD-10-CM

## 2022-02-08 ENCOUNTER — Other Ambulatory Visit: Payer: Self-pay | Admitting: Family Medicine

## 2022-02-08 DIAGNOSIS — I152 Hypertension secondary to endocrine disorders: Secondary | ICD-10-CM

## 2022-02-12 ENCOUNTER — Other Ambulatory Visit: Payer: Self-pay | Admitting: Family Medicine

## 2022-02-13 ENCOUNTER — Other Ambulatory Visit: Payer: Self-pay | Admitting: Family Medicine

## 2022-02-13 DIAGNOSIS — E11628 Type 2 diabetes mellitus with other skin complications: Secondary | ICD-10-CM

## 2022-02-13 DIAGNOSIS — E1169 Type 2 diabetes mellitus with other specified complication: Secondary | ICD-10-CM

## 2022-02-17 ENCOUNTER — Ambulatory Visit: Payer: Medicaid Other | Admitting: Family Medicine

## 2022-02-27 ENCOUNTER — Encounter: Payer: Self-pay | Admitting: Family Medicine

## 2022-02-27 ENCOUNTER — Ambulatory Visit: Payer: Medicaid Other | Admitting: Family Medicine

## 2022-02-27 VITALS — BP 116/82 | HR 83 | Temp 98.3°F | Ht 76.0 in | Wt 270.0 lb

## 2022-02-27 DIAGNOSIS — L84 Corns and callosities: Secondary | ICD-10-CM

## 2022-02-27 DIAGNOSIS — E11628 Type 2 diabetes mellitus with other skin complications: Secondary | ICD-10-CM

## 2022-02-27 DIAGNOSIS — E118 Type 2 diabetes mellitus with unspecified complications: Secondary | ICD-10-CM | POA: Diagnosis not present

## 2022-02-27 DIAGNOSIS — Z23 Encounter for immunization: Secondary | ICD-10-CM | POA: Diagnosis not present

## 2022-02-27 DIAGNOSIS — E1159 Type 2 diabetes mellitus with other circulatory complications: Secondary | ICD-10-CM | POA: Diagnosis not present

## 2022-02-27 DIAGNOSIS — E785 Hyperlipidemia, unspecified: Secondary | ICD-10-CM

## 2022-02-27 DIAGNOSIS — E1169 Type 2 diabetes mellitus with other specified complication: Secondary | ICD-10-CM

## 2022-02-27 DIAGNOSIS — I152 Hypertension secondary to endocrine disorders: Secondary | ICD-10-CM | POA: Diagnosis not present

## 2022-02-27 LAB — BAYER DCA HB A1C WAIVED: HB A1C (BAYER DCA - WAIVED): 6.9 % — ABNORMAL HIGH (ref 4.8–5.6)

## 2022-02-27 NOTE — Progress Notes (Signed)
BP 116/82   Pulse 83   Temp 98.3 F (36.8 C)   Ht 6' 4" (1.93 m)   Wt 270 lb (122.5 kg)   SpO2 96%   BMI 32.87 kg/m    Subjective:   Patient ID: Evan [name], male    DOB: July 24, 1965, 56 y.o.   MRN: [id number]  HPI: Evan [name] is a 56 y.o. male presenting on 02/27/2022 for Medical Management of Chronic Issues, Hyperlipidemia, Hypertension, and Diabetes   HPI Type 2 diabetes mellitus Patient comes in today for recheck of his diabetes. Patient has been currently taking Jardiance and Trulicity. Patient is currently on an ACE inhibitor/ARB. Patient has not seen an ophthalmologist this year. Patient denies any issues with their feet. The symptom started onset as an adult hypertension and hyperlipidemia ARE RELATED TO DM   Hypertension Patient is currently on carvedilol and furosemide and losartan and spironolactone, and their blood pressure today is 116/82. Patient denies any lightheadedness or dizziness. Patient denies headaches, blurred vision, chest pains, shortness of breath, or weakness. Denies any side effects from medication and is content with current medication.   Hyperlipidemia Patient is coming in for recheck of his hyperlipidemia. The patient is currently taking atorvastatin and fenofibrate and fish oils. They deny any issues with myalgias or history of liver damage from it. They deny any focal numbness or weakness or chest pain.   Relevant past medical, surgical, family and social history reviewed and updated as indicated. Interim medical history since our last visit reviewed. Allergies and medications reviewed and updated.  Review of Systems  Constitutional:  Negative for chills and fever.  Eyes:  Negative for visual disturbance.  Respiratory:  Negative for shortness of breath and wheezing.   Cardiovascular:  Negative for chest pain and leg swelling.  Musculoskeletal:  Negative for back pain and gait problem.  Skin:  Negative for rash.  Neurological:  Negative  for dizziness, weakness, light-headedness and headaches.  All other systems reviewed and are negative.   Per HPI unless specifically indicated above   Allergies as of 02/27/2022   No Known Allergies      Medication List        Accurate as of February 27, 2022  4:00 PM. If you have any questions, ask your nurse or doctor.          Accu-Chek Aviva device Test BS BID Dx E11.69   Accu-Chek Aviva Plus w/Device Kit 1 each by Does not apply route 4 (four) times daily.   Accu-Chek Guide test strip Generic drug: glucose blood Check BS in the morning and at bedtime Dx E11.8   aspirin EC 81 MG tablet Take 81 mg by mouth daily.   atorvastatin 80 MG tablet Commonly known as: LIPITOR Take 1 tablet (80 mg total) by mouth daily at 6 PM.   BD Pen Needle Nano 2nd Gen 32G X 4 MM Misc Generic drug: Insulin Pen Needle Use daily with insulin Dx E11.9, Z79.4   carvedilol 12.5 MG tablet Commonly known as: COREG Take 1 tablet (12.5 mg total) by mouth 2 (two) times daily with a meal.   clopidogrel 75 MG tablet Commonly known as: PLAVIX Take 1 tablet (75 mg total) by mouth daily.   empagliflozin 25 MG Tabs tablet Commonly known as: Jardiance Take 1 tablet (25 mg total) by mouth daily. (NEEDS TO BE SEEN BEFORE NEXT REFILL)   famotidine 20 MG tablet Commonly known as: Pepcid Take 1 tablet (20 mg total) by mouth  2 (two) times daily.   fenofibrate 145 MG tablet Commonly known as: TRICOR TAKE 1 TABLET (145 MG TOTAL) BY MOUTH DAILY. (NEEDS TO BE SEEN BEFORE NEXT REFILL)   Fish Oil 1000 MG Caps Take 2,000 mg by mouth daily.   fluticasone 50 MCG/ACT nasal spray Commonly known as: FLONASE Place 1 spray into both nostrils daily.   furosemide 20 MG tablet Commonly known as: LASIX TAKE 1 TABLET BY MOUTH EVERY DAY   losartan 25 MG tablet Commonly known as: COZAAR TAKE 1 TABLET BY MOUTH EVERYDAY AT BEDTIME   meloxicam 7.5 MG tablet Commonly known as: MOBIC Take 1 tablet by mouth  2 (two) times daily as needed.   methocarbamol 500 MG tablet Commonly known as: ROBAXIN Take 500 mg by mouth 2 (two) times daily.   potassium chloride 10 MEQ tablet Commonly known as: KLOR-CON Take 1 tablet (10 mEq total) by mouth daily.   sildenafil 20 MG tablet Commonly known as: REVATIO Take 1-5 tablets (20-100 mg total) by mouth daily as needed.   spironolactone 25 MG tablet Commonly known as: ALDACTONE TAKE 1/2 TABLET BY MOUTH EVERY DAY   Trulicity 1.5 MG/0.5ML Sopn Generic drug: Dulaglutide Inject 1.5 mg into the skin once a week.         Objective:   BP 116/82   Pulse 83   Temp 98.3 F (36.8 C)   Ht 6' 4" (1.93 m)   Wt 270 lb (122.5 kg)   SpO2 96%   BMI 32.87 kg/m   Wt Readings from Last 3 Encounters:  02/27/22 270 lb (122.5 kg)  11/15/21 255 lb (115.7 kg)  09/18/21 260 lb 6.4 oz (118.1 kg)    Physical Exam Vitals and nursing note reviewed.  Constitutional:      General: He is not in acute distress.    Appearance: He is well-developed. He is not diaphoretic.  Eyes:     General: No scleral icterus.    Conjunctiva/sclera: Conjunctivae normal.  Neck:     Thyroid: No thyromegaly.  Cardiovascular:     Rate and Rhythm: Normal rate and regular rhythm.     Heart sounds: Normal heart sounds. No murmur heard. Pulmonary:     Effort: Pulmonary effort is normal. No respiratory distress.     Breath sounds: Normal breath sounds. No wheezing.  Musculoskeletal:        General: No swelling. Normal range of motion.     Cervical back: Neck supple.  Lymphadenopathy:     Cervical: No cervical adenopathy.  Skin:    General: Skin is warm and dry.     Findings: No rash.  Neurological:     Mental Status: He is alert and oriented to person, place, and time.     Coordination: Coordination normal.  Psychiatric:        Behavior: Behavior normal.       Assessment & Plan:   Problem List Items Addressed This Visit       Cardiovascular and Mediastinum    Hypertension associated with diabetes (HCC)   Relevant Orders   CBC with Differential/Platelet   CMP14+EGFR   Lipid panel   Bayer DCA Hb A1c Waived     Endocrine   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)   Hyperlipidemia associated with type 2 diabetes mellitus (HCC)   Relevant Orders   CBC with Differential/Platelet   CMP14+EGFR   Lipid panel   Bayer DCA Hb A1c Waived   Type 2 diabetes mellitus with complications (  HCC) - Primary   Relevant Orders   CBC with Differential/Platelet   CMP14+EGFR   Lipid panel   Bayer DCA Hb A1c Waived    A1c is 6.9 which looks decent.  Continue current medicine, focus on diet, avoid sweets Follow up plan: Return in about 3 months (around 05/30/2022), or if symptoms worsen or fail to improve, for Diabetes and hypertension and cholesterol.  Counseling provided for all of the vaccine components Orders Placed This Encounter  Procedures   CBC with Differential/Platelet   CMP14+EGFR   Lipid panel   Bayer DCA Hb A1c Waived    Joshua Dettinger, MD [facility] 02/27/2022, 4:00 PM     

## 2022-02-28 LAB — CBC WITH DIFFERENTIAL/PLATELET
Basophils Absolute: 0.1 10*3/uL (ref 0.0–0.2)
Basos: 1 %
EOS (ABSOLUTE): 0.3 10*3/uL (ref 0.0–0.4)
Eos: 3 %
Hematocrit: 46.7 % (ref 37.5–51.0)
Hemoglobin: 15.7 g/dL (ref 13.0–17.7)
Immature Grans (Abs): 0 10*3/uL (ref 0.0–0.1)
Immature Granulocytes: 0 %
Lymphocytes Absolute: 1.9 10*3/uL (ref 0.7–3.1)
Lymphs: 22 %
MCH: 30.7 pg (ref 26.6–33.0)
MCHC: 33.6 g/dL (ref 31.5–35.7)
MCV: 91 fL (ref 79–97)
Monocytes Absolute: 0.7 10*3/uL (ref 0.1–0.9)
Monocytes: 7 %
Neutrophils Absolute: 6 10*3/uL (ref 1.4–7.0)
Neutrophils: 67 %
Platelets: 276 10*3/uL (ref 150–450)
RBC: 5.12 x10E6/uL (ref 4.14–5.80)
RDW: 12.4 % (ref 11.6–15.4)
WBC: 8.9 10*3/uL (ref 3.4–10.8)

## 2022-02-28 LAB — LIPID PANEL
Chol/HDL Ratio: 4.4 ratio (ref 0.0–5.0)
Cholesterol, Total: 190 mg/dL (ref 100–199)
HDL: 43 mg/dL (ref >39)
LDL Chol Calc (NIH): 102 mg/dL — ABNORMAL HIGH (ref 0–99)
Triglycerides: 263 mg/dL — ABNORMAL HIGH (ref 0–149)
VLDL Cholesterol Cal: 45 mg/dL — ABNORMAL HIGH (ref 5–40)

## 2022-02-28 LAB — CMP14+EGFR
ALT: 17 IU/L (ref 0–44)
AST: 16 IU/L (ref 0–40)
Albumin/Globulin Ratio: 1.8 (ref 1.2–2.2)
Albumin: 4.6 g/dL (ref 3.8–4.9)
Alkaline Phosphatase: 84 IU/L (ref 44–121)
BUN/Creatinine Ratio: 14 (ref 9–20)
BUN: 16 mg/dL (ref 6–24)
Bilirubin Total: <0.2 mg/dL (ref 0.0–1.2)
CO2: 20 mmol/L (ref 20–29)
Calcium: 9.2 mg/dL (ref 8.7–10.2)
Chloride: 104 mmol/L (ref 96–106)
Creatinine, Ser: 1.12 mg/dL (ref 0.76–1.27)
Globulin, Total: 2.5 g/dL (ref 1.5–4.5)
Glucose: 202 mg/dL — ABNORMAL HIGH (ref 70–99)
Potassium: 4.2 mmol/L (ref 3.5–5.2)
Sodium: 141 mmol/L (ref 134–144)
Total Protein: 7.1 g/dL (ref 6.0–8.5)
eGFR: 77 mL/min/{1.73_m2} (ref >59)

## 2022-03-15 ENCOUNTER — Other Ambulatory Visit: Payer: Self-pay | Admitting: Family Medicine

## 2022-03-15 DIAGNOSIS — E1169 Type 2 diabetes mellitus with other specified complication: Secondary | ICD-10-CM

## 2022-03-15 DIAGNOSIS — E11628 Type 2 diabetes mellitus with other skin complications: Secondary | ICD-10-CM

## 2022-03-24 ENCOUNTER — Other Ambulatory Visit: Payer: Self-pay | Admitting: Family Medicine

## 2022-03-24 DIAGNOSIS — E11628 Type 2 diabetes mellitus with other skin complications: Secondary | ICD-10-CM

## 2022-04-01 ENCOUNTER — Other Ambulatory Visit: Payer: Self-pay | Admitting: Family Medicine

## 2022-04-01 ENCOUNTER — Ambulatory Visit: Payer: Medicaid Other | Admitting: Gastroenterology

## 2022-04-01 DIAGNOSIS — E1159 Type 2 diabetes mellitus with other circulatory complications: Secondary | ICD-10-CM

## 2022-04-01 NOTE — Progress Notes (Deleted)
[facility] progress note:  History: Evan [name] 04/01/2022    Reason for consult/chief complaint: No chief complaint on file.   Subjective  HPI: Evan [name] was here to discuss and plan surveillance colonoscopy.  He underwent his first screening colonoscopy with me in May 2022, at which time 10 adenomatous polyps were removed.  His procedure was done in the hospital outpatient endoscopy department due to his ischemic cardiomyopathy.  It also required a preprocedure 5-day hold of Brilinta. ***   ROS:  Review of Systems   Past Medical History: Past Medical History:  Diagnosis Date   Arthritis    Atrial fibrillation (HCC)    CHF (congestive heart failure) ([place])    Diabetes mellitus without complication ([place])    Gout    Heart attack ([place]) 10/2018   Hyperlipidemia    Hypertension    Morbid obesity ([place]) 12/22/2016   Sleep apnea    had test twice unable to complete     Past Surgical History: Past Surgical History:  Procedure Laterality Date   ANKLE ARTHROSCOPY Right    COLONOSCOPY WITH PROPOFOL N/A 09/18/2020   Procedure: COLONOSCOPY WITH PROPOFOL;  Surgeon: Evan Stabler, MD;  Location: [facility];  Service: Gastroenterology;  Laterality: N/A;  09-18-2020   CORONARY STENT INTERVENTION N/A 11/24/2018   Procedure: CORONARY STENT INTERVENTION;  Surgeon: Evan Hampshire, MD;  Location: [facility];  Service: Cardiovascular;  Laterality: N/A;  prox and mid LAD   CORONARY STENT INTERVENTION N/A 11/26/2018   Procedure: CORONARY STENT INTERVENTION;  Surgeon: Evan Hampshire, MD;  Location: [facility];  Service: Cardiovascular;  Laterality: N/A;   CYST REMOVAL HAND     KNEE ARTHROSCOPY Left    LEFT HEART CATH AND CORONARY ANGIOGRAPHY N/A 11/24/2018   Procedure: LEFT HEART CATH AND CORONARY ANGIOGRAPHY;  Surgeon: Evan Hampshire, MD;  Location: [facility];  Service: Cardiovascular;  Laterality: N/A;   POLYPECTOMY  09/18/2020    Procedure: POLYPECTOMY;  Surgeon: Evan Stabler, MD;  Location: [facility];  Service: Gastroenterology;;     Family History: Family History  Problem Relation Age of Onset   Diabetes Mother    Stroke Mother    Early death Father    Heart attack Father        died from heart attack at the age of 2   Alcohol abuse Father    Heart disease Father    Breast cancer Sister    Hypertension Brother    Heart attack Maternal Grandfather        died from heart attack at the age of 49   Early death Paternal Grandfather    Heart attack Paternal Grandfather    Colon cancer Neg Hx    Esophageal cancer Neg Hx    Rectal cancer Neg Hx    Stomach cancer Neg Hx     Social History: Social History   Socioeconomic History   Marital status: Single    Spouse name: Not on file   Number of children: Not on file   Years of education: Not on file   Highest education level: Not on file  Occupational History   Not on file  Tobacco Use   Smoking status: Never   Smokeless tobacco: Never  Vaping Use   Vaping Use: Never used  Substance and Sexual Activity   Alcohol use: Yes    Alcohol/week: 12.0 standard drinks of alcohol    Types: 12 Cans  of beer per week    Comment: 5 beers a week   Drug use: No   Sexual activity: Yes    Comment: male 8 months partner  Other Topics Concern   Not on file  Social History Narrative   Not on file   Social Determinants of Health   Financial Resource Strain: Not on file  Food Insecurity: Not on file  Transportation Needs: Not on file  Physical Activity: Not on file  Stress: Not on file  Social Connections: Not on file    Allergies: No Known Allergies  Outpatient Meds: Current Outpatient Medications  Medication Sig Dispense Refill   aspirin EC 81 MG tablet Take 81 mg by mouth daily.     atorvastatin (LIPITOR) 80 MG tablet Take 1 tablet (80 mg total) by mouth daily at 6 PM. 90 tablet 3   BD PEN NEEDLE NANO 2ND GEN 32G X 4 MM MISC Use daily  with insulin Dx E11.9, Z79.4 100 each 3   Blood Glucose Monitoring Suppl (ACCU-CHEK AVIVA PLUS) w/Device KIT 1 each by Does not apply route 4 (four) times daily. 1 kit 1   Blood Glucose Monitoring Suppl (ACCU-CHEK AVIVA) device Test BS BID Dx E11.69 1 each 0   carvedilol (COREG) 12.5 MG tablet TAKE 1 TABLET (12.5MG TOTAL) BY MOUTH TWICE A DAY WITH MEALS 180 tablet 1   clopidogrel (PLAVIX) 75 MG tablet Take 1 tablet (75 mg total) by mouth daily. 90 tablet 3   Dulaglutide (TRULICITY) 1.5 XT/0.6YI SOPN Inject 1.5 mg into the skin once a week. 6 mL 3   empagliflozin (JARDIANCE) 25 MG TABS tablet Take 1 tablet (25 mg total) by mouth daily. 90 tablet 0   famotidine (PEPCID) 20 MG tablet Take 1 tablet (20 mg total) by mouth 2 (two) times daily. 180 tablet 3   fenofibrate (TRICOR) 145 MG tablet Take 1 tablet (145 mg total) by mouth daily. 90 tablet 1   fluticasone (FLONASE) 50 MCG/ACT nasal spray Place 1 spray into both nostrils daily. 16 mL 11   furosemide (LASIX) 20 MG tablet TAKE 1 TABLET BY MOUTH EVERY DAY 90 tablet 0   glucose blood (ACCU-CHEK GUIDE) test strip Check BS in the morning and at bedtime Dx E11.8 200 strip 3   losartan (COZAAR) 25 MG tablet TAKE 1 TABLET BY MOUTH EVERYDAY AT BEDTIME 90 tablet 3   meloxicam (MOBIC) 7.5 MG tablet Take 1 tablet by mouth 2 (two) times daily as needed.     methocarbamol (ROBAXIN) 500 MG tablet Take 500 mg by mouth 2 (two) times daily.     Omega-3 Fatty Acids (FISH OIL) 1000 MG CAPS Take 2,000 mg by mouth daily.     potassium chloride (KLOR-CON) 10 MEQ tablet Take 1 tablet (10 mEq total) by mouth daily. 90 tablet 1   sildenafil (REVATIO) 20 MG tablet Take 1-5 tablets (20-100 mg total) by mouth daily as needed. 50 tablet 1   spironolactone (ALDACTONE) 25 MG tablet TAKE 1/2 TABLET BY MOUTH EVERY DAY 45 tablet 3   No current facility-administered medications for this visit.      ___________________________________________________________________ Objective    Exam:  There were no vitals taken for this visit. Wt Readings from Last 3 Encounters:  02/27/22 270 lb (122.5 kg)  11/15/21 255 lb (115.7 kg)  09/18/21 260 lb 6.4 oz (118.1 kg)    General: ***  Eyes: sclera anicteric, no redness ENT: oral mucosa moist without lesions, no cervical or supraclavicular lymphadenopathy CV: ***,  no JVD, no peripheral edema Resp: clear to auscultation bilaterally, normal RR and effort noted GI: soft, *** tenderness, with active bowel sounds. No guarding or palpable organomegaly noted. Skin; warm and dry, no rash or jaundice noted Neuro: awake, alert and oriented x 3. Normal gross motor function and fluent speech  Labs:  ***  Radiologic Studies:  ***  Assessment: No diagnosis found.  ***  Plan:  ***  Thank you for the courtesy of this consult.  Please call me with any questions or concerns.  Nelida Meuse III

## 2022-05-30 ENCOUNTER — Ambulatory Visit: Payer: Medicaid Other | Admitting: Family Medicine

## 2022-05-30 DIAGNOSIS — E118 Type 2 diabetes mellitus with unspecified complications: Secondary | ICD-10-CM

## 2022-05-30 DIAGNOSIS — E11628 Type 2 diabetes mellitus with other skin complications: Secondary | ICD-10-CM

## 2022-06-03 ENCOUNTER — Ambulatory Visit: Payer: Medicaid Other | Admitting: Gastroenterology

## 2022-06-03 ENCOUNTER — Other Ambulatory Visit: Payer: Self-pay | Admitting: Family Medicine

## 2022-06-03 NOTE — Progress Notes (Deleted)
[facility] Consult Note:  History: Evan [name] 06/03/2022  Referring provider: Dettinger, Fransisca Kaufmann, MD  Reason for consult/chief complaint: No chief complaint on file.   Subjective  HPI:  *** 10 adenomatous colon polyps on screening exam May 2022   No-show for clinic visit 04/01/2022 ROS:  Review of Systems   Past Medical History: Past Medical History:  Diagnosis Date   Arthritis    Atrial fibrillation ([place])    CHF (congestive heart failure) ([place])    Diabetes mellitus without complication ([place])    Gout    Heart attack ([place]) 10/2018   Hyperlipidemia    Hypertension    Morbid obesity ([place]) 12/22/2016   Sleep apnea    had test twice unable to complete     Past Surgical History: Past Surgical History:  Procedure Laterality Date   ANKLE ARTHROSCOPY Right    COLONOSCOPY WITH PROPOFOL N/A 09/18/2020   Procedure: COLONOSCOPY WITH PROPOFOL;  Surgeon: Doran Stabler, MD;  [facility]: [facility];  Service: Gastroenterology;  Laterality: N/A;  09-18-2020   CORONARY STENT INTERVENTION N/A 11/24/2018   Procedure: CORONARY STENT INTERVENTION;  Surgeon: Wellington Hampshire, MD;  Location: [facility];  Service: Cardiovascular;  Laterality: N/A;  prox and mid LAD   CORONARY STENT INTERVENTION N/A 11/26/2018   Procedure: CORONARY STENT INTERVENTION;  Surgeon: Wellington Hampshire, MD;  Location: [facility];  Service: Cardiovascular;  Laterality: N/A;   CYST REMOVAL HAND     KNEE ARTHROSCOPY Left    LEFT HEART CATH AND CORONARY ANGIOGRAPHY N/A 11/24/2018   Procedure: LEFT HEART CATH AND CORONARY ANGIOGRAPHY;  Surgeon: Wellington Hampshire, MD;  Location: [facility];  Service: Cardiovascular;  Laterality: N/A;   POLYPECTOMY  09/18/2020   Procedure: POLYPECTOMY;  Surgeon: Doran Stabler, MD;  [facility];  Service: Gastroenterology;;     Family History: Family History  Problem Relation Age of Onset   Diabetes Mother    Stroke  Mother    Early death Father    Heart attack Father        died from heart attack at the age of 72   Alcohol abuse Father    Heart disease Father    Breast cancer Sister    Hypertension Brother    Heart attack Maternal Grandfather        died from heart attack at the age of 12   Early death Paternal Grandfather    Heart attack Paternal Grandfather    Colon cancer Neg Hx    Esophageal cancer Neg Hx    Rectal cancer Neg Hx    Stomach cancer Neg Hx     Social History: Social History   Socioeconomic History   Marital status: Single    Spouse name: Not on file   Number of children: Not on file   Years of education: Not on file   Highest education level: Not on file  Occupational History   Not on file  Tobacco Use   Smoking status: Never   Smokeless tobacco: Never  Vaping Use   Vaping Use: Never used  Substance and Sexual Activity   Alcohol use: Yes    Alcohol/week: 12.0 standard drinks of alcohol    Types: 12 Cans of beer per week    Comment: 5 beers a week   Drug use: No   Sexual activity: Yes    Comment: male 8 months partner  Other Topics Concern  Not on file  Social History Narrative   Not on file   Social Determinants of Health   Financial Resource Strain: Not on file  Food Insecurity: Not on file  Transportation Needs: Not on file  Physical Activity: Not on file  Stress: Not on file  Social Connections: Not on file    Allergies: No Known Allergies  Outpatient Meds: Current Outpatient Medications  Medication Sig Dispense Refill   aspirin EC 81 MG tablet Take 81 mg by mouth daily.     atorvastatin (LIPITOR) 80 MG tablet TAKE 1 TABLET BY MOUTH DAILY AT 6 PM. 90 tablet 0   BD PEN NEEDLE NANO 2ND GEN 32G X 4 MM MISC Use daily with insulin Dx E11.9, Z79.4 100 each 3   Blood Glucose Monitoring Suppl (ACCU-CHEK AVIVA PLUS) w/Device KIT 1 each by Does not apply route 4 (four) times daily. 1 kit 1   Blood Glucose Monitoring Suppl (ACCU-CHEK AVIVA) device  Test BS BID Dx E11.69 1 each 0   carvedilol (COREG) 12.5 MG tablet TAKE 1 TABLET (12.5MG TOTAL) BY MOUTH TWICE A DAY WITH MEALS 180 tablet 1   clopidogrel (PLAVIX) 75 MG tablet Take 1 tablet (75 mg total) by mouth daily. 90 tablet 3   Dulaglutide (TRULICITY) 1.5 0000000 SOPN Inject 1.5 mg into the skin once a week. 6 mL 3   empagliflozin (JARDIANCE) 25 MG TABS tablet Take 1 tablet (25 mg total) by mouth daily. 90 tablet 0   famotidine (PEPCID) 20 MG tablet Take 1 tablet (20 mg total) by mouth 2 (two) times daily. 180 tablet 3   fenofibrate (TRICOR) 145 MG tablet Take 1 tablet (145 mg total) by mouth daily. 90 tablet 1   fluticasone (FLONASE) 50 MCG/ACT nasal spray Place 1 spray into both nostrils daily. 16 mL 11   furosemide (LASIX) 20 MG tablet TAKE 1 TABLET BY MOUTH EVERY DAY 90 tablet 0   glucose blood (ACCU-CHEK GUIDE) test strip Check BS in the morning and at bedtime Dx E11.8 200 strip 3   losartan (COZAAR) 25 MG tablet TAKE 1 TABLET BY MOUTH EVERYDAY AT BEDTIME 90 tablet 3   meloxicam (MOBIC) 7.5 MG tablet Take 1 tablet by mouth 2 (two) times daily as needed.     methocarbamol (ROBAXIN) 500 MG tablet Take 500 mg by mouth 2 (two) times daily.     Omega-3 Fatty Acids (FISH OIL) 1000 MG CAPS Take 2,000 mg by mouth daily.     potassium chloride (KLOR-CON) 10 MEQ tablet Take 1 tablet (10 mEq total) by mouth daily. 90 tablet 1   sildenafil (REVATIO) 20 MG tablet Take 1-5 tablets (20-100 mg total) by mouth daily as needed. 50 tablet 1   spironolactone (ALDACTONE) 25 MG tablet TAKE 1/2 TABLET BY MOUTH EVERY DAY 45 tablet 3   No current facility-administered medications for this visit.      ___________________________________________________________________ Objective   Exam:  There were no vitals taken for this visit. Wt Readings from Last 3 Encounters:  02/27/22 270 lb (122.5 kg)  11/15/21 255 lb (115.7 kg)  09/18/21 260 lb 6.4 oz (118.1 kg)    General: ***  Eyes: sclera anicteric,  no redness ENT: oral mucosa moist without lesions, no cervical or supraclavicular lymphadenopathy CV: ***, no JVD, no peripheral edema Resp: clear to auscultation bilaterally, normal RR and effort noted GI: soft, *** tenderness, with active bowel sounds. No guarding or palpable organomegaly noted. Skin; warm and dry, no rash or jaundice noted Neuro:  awake, alert and oriented x 3. Normal gross motor function and fluent speech  Labs:  ***  Radiologic Studies:  ***  Assessment: No diagnosis found.  ***  Plan:  ***  Thank you for the courtesy of this consult.  Please call me with any questions or concerns.  Nelida Meuse III  CC: Referring provider noted above

## 2022-06-23 ENCOUNTER — Other Ambulatory Visit: Payer: Self-pay | Admitting: Family Medicine

## 2022-06-23 DIAGNOSIS — E11628 Type 2 diabetes mellitus with other skin complications: Secondary | ICD-10-CM

## 2022-06-30 ENCOUNTER — Encounter: Payer: Self-pay | Admitting: Family Medicine

## 2022-06-30 ENCOUNTER — Ambulatory Visit: Payer: Medicaid Other | Admitting: Family Medicine

## 2022-07-01 ENCOUNTER — Telehealth: Payer: Self-pay

## 2022-07-01 ENCOUNTER — Encounter: Payer: Self-pay | Admitting: Gastroenterology

## 2022-07-01 ENCOUNTER — Ambulatory Visit: Payer: Medicaid Other | Admitting: Gastroenterology

## 2022-07-01 VITALS — BP 138/80 | HR 87 | Ht 76.0 in | Wt 273.0 lb

## 2022-07-01 DIAGNOSIS — Z7902 Long term (current) use of antithrombotics/antiplatelets: Secondary | ICD-10-CM | POA: Diagnosis not present

## 2022-07-01 DIAGNOSIS — Z86010 Personal history of colonic polyps: Secondary | ICD-10-CM

## 2022-07-01 MED ORDER — NA SULFATE-K SULFATE-MG SULF 17.5-3.13-1.6 GM/177ML PO SOLN: 1.0000 | Freq: Once | ORAL | 0 refills | Status: AC

## 2022-07-01 NOTE — Patient Instructions (Addendum)
_______________________________________________________  If your blood pressure at your visit was 140/90 or greater, please contact your primary care physician to follow up on this.  _______________________________________________________  If you are age 57 or older, your body mass index should be between 23-30. Your Body mass index is 33.23 kg/m. If this is out of the aforementioned range listed, please consider follow up with your Primary Care Provider.  If you are age 47 or younger, your body mass index should be between 19-25. Your Body mass index is 33.23 kg/m. If this is out of the aformentioned range listed, please consider follow up with your Primary Care Provider.   ________________________________________________________  The [facility] providers would like to encourage you to use [facility] to communicate with providers for non-urgent requests or questions.  Due to long hold times on the telephone, sending your provider a message by [facility] may be a faster and more efficient way to get a response.  Please allow 48 business hours for a response.  Please remember that this is for non-urgent requests.  _______________________________________________________  Dennis Bast will be contacted by our office prior to your procedure for directions on holding your Plavix.  If you do not hear from our office 1 week prior to your scheduled procedure, please call [phone] to discuss.    You have been scheduled for a colonoscopy. Please follow written instructions given to you at your visit today.  Please pick up your prep supplies at the pharmacy within the next 1-3 days. If you use inhalers (even only as needed), please bring them with you on the day of your procedure.   Due to recent changes in healthcare laws, you may see the results of your imaging and laboratory studies on MyChart before your provider has had a chance to review them.  We understand that in some cases there may be results that are  confusing or concerning to you. Not all laboratory results come back in the same time frame and the provider may be waiting for multiple results in order to interpret others.  Please give [place] 48 hours in order for your provider to thoroughly review all the results before contacting the office for clarification of your results.    It was a pleasure to see you today!  Thank you for trusting me with your gastrointestinal care!

## 2022-07-01 NOTE — Telephone Encounter (Signed)
[facility] Pre-operative Risk Assessment     Request for surgical clearance:     Endoscopy Procedure  What type of surgery is being performed?     colonoscopy  When is this surgery scheduled?     07-28-2022  What type of clearance is required ?   Pharmacy  Are there any medications that need to be held prior to surgery and how long? Yes, Plavix, 5 days   Practice name and name of physician performing surgery?      [facility]  What is your office phone and fax number?      Phone- [phone]  [phone]  Anesthesia type (None, local, MAC, general) ?       MAC

## 2022-07-01 NOTE — Telephone Encounter (Signed)
   Patient Name: Evan [name]  DOB: [birth year] MRN: [id number]  Primary Cardiologist: Quay Burow, MD  Chart reviewed as part of pre-operative protocol coverage.  Patient can hold Plavix for 5 days prior to upcoming colonoscopy.  Please restart when hemostasis is achieved postprocedure.  I will route this recommendation to the requesting party via Epic fax function and remove from pre-op pool.  Please call with questions.  Mable Fill, Marissa Nestle, NP 07/01/2022, 4:15 PM

## 2022-07-01 NOTE — Telephone Encounter (Signed)
Hello Dr. Gwenlyn Found,  We received a preoperative clearance request for holding Plavix for 5 days prior to colonoscopy for Mr. Evan [name].   He has a history of CAD with staged PCI 11/24/2018 and 11/26/2018. Received total DES x 3 to LAD and RPDA. H/o ICM EF 35-40%. He was last seen in office on 09/18/21 he was clinically stable and denied any chest pain or SOB. He was switched from Brilinta to Plavix.  Dr. Gwenlyn Found please comment on holding Plavix for 5 days   Please route response to [facility] PREOP   Thanks!

## 2022-07-01 NOTE — Progress Notes (Signed)
Evan [name]:  History: Evan [name] 07/01/2022  Referring provider: Dettinger, Fransisca Kaufmann, MD  Reason for consult/chief complaint: Colonoscopy (Pt states he is doing well today , pt is here to discuss having an colonoscopy)   Subjective  HPI: Evan [name] is here for follow-up to discuss a surveillance colonoscopy.  I initially saw him in the office April 2022 to discuss screening colonoscopy, which was done a month later in the [facility] outpatient endoscopy department due to the patient's cardiac issues outlined in my consult [name]. Colonoscopy 09/18/2020 was a complete exam to the cecum with excellent preparation.  10 subcentimeter tubular adenomas without high-grade dysplasia were removed.  He was on Brilinta that has since been changed to Plavix. _______________  Evan [name] denies abdominal pain, altered bowel habits or rectal bleeding.  He has occasional regurgitation or heartburn.  Denies dysphagia odynophagia or vomiting or weight loss.  (This patient was a no-show for clinic appointments with me on 04/01/2022 and 06/03/2022) ROS:  Review of Systems  Constitutional:  Positive for fatigue. Negative for appetite change and unexpected weight change.  HENT:  Negative for mouth sores and voice change.   Eyes:  Negative for pain and redness.  Respiratory:  Negative for cough and shortness of breath.   Cardiovascular:  Negative for chest pain and palpitations.  Genitourinary:  Negative for dysuria and hematuria.  Musculoskeletal:  Positive for arthralgias. Negative for myalgias.  Skin:  Negative for pallor and rash.  Neurological:  Negative for weakness and headaches.  Hematological:  Negative for adenopathy.     Past Medical History: Past Medical History:  Diagnosis Date   Arthritis    Atrial fibrillation ([facility])    CHF (congestive heart failure) ([place])    Diabetes mellitus without complication ([place])    Gout    Heart attack ([place]) 10/2018    Hyperlipidemia    Hypertension    Morbid obesity ([place]) 12/22/2016   Sleep apnea    had test twice unable to complete    Past Surgical History: Past Surgical History:  Procedure Laterality Date   ANKLE ARTHROSCOPY Right    COLONOSCOPY WITH PROPOFOL N/A 09/18/2020   Procedure: COLONOSCOPY WITH PROPOFOL;  Surgeon: Doran Stabler, MD;  [facility];  Service: Gastroenterology;  Laterality: N/A;  09-18-2020   CORONARY STENT INTERVENTION N/A 11/24/2018   Procedure: CORONARY STENT INTERVENTION;  Surgeon: Wellington Hampshire, MD;  Location: [facility];  Service: Cardiovascular;  Laterality: N/A;  prox and mid LAD   CORONARY STENT INTERVENTION N/A 11/26/2018   Procedure: CORONARY STENT INTERVENTION;  Surgeon: Wellington Hampshire, MD;  Location: [facility];  Service: Cardiovascular;  Laterality: N/A;   CYST REMOVAL HAND     KNEE ARTHROSCOPY Left    LEFT HEART CATH AND CORONARY ANGIOGRAPHY N/A 11/24/2018   Procedure: LEFT HEART CATH AND CORONARY ANGIOGRAPHY;  Surgeon: Wellington Hampshire, MD;  Location: [facility];  Service: Cardiovascular;  Laterality: N/A;   POLYPECTOMY  09/18/2020   Procedure: POLYPECTOMY;  Surgeon: Doran Stabler, MD;  [facility];  Service: Gastroenterology;;     Family History: Family History  Problem Relation Age of Onset   Diabetes Mother    Stroke Mother    Early death Father    Heart attack Father        died from heart attack at the age of 74   Alcohol abuse Father    Heart disease Father  Breast cancer Sister    Hypertension Brother    Heart attack Maternal Grandfather        died from heart attack at the age of 28   Early death Paternal Grandfather    Heart attack Paternal Grandfather    Colon cancer Neg Hx    Esophageal cancer Neg Hx    Rectal cancer Neg Hx    Stomach cancer Neg Hx     Social History: Social History   Socioeconomic History   Marital status: Single    Spouse name: Not on file   Number of  children: Not on file   Years of education: Not on file   Highest education level: Not on file  Occupational History   Not on file  Tobacco Use   Smoking status: Never   Smokeless tobacco: Never  Vaping Use   Vaping Use: Never used  Substance and Sexual Activity   Alcohol use: Yes    Alcohol/week: 12.0 standard drinks of alcohol    Types: 12 Cans of beer per week    Comment: 5 beers a week   Drug use: No   Sexual activity: Yes    Comment: male 8 months partner  Other Topics Concern   Not on file  Social History Narrative   Not on file   Social Determinants of Health   Financial Resource Strain: Not on file  Food Insecurity: Not on file  Transportation Needs: Not on file  Physical Activity: Not on file  Stress: Not on file  Social Connections: Not on file    Allergies: No Known Allergies  Outpatient Meds: Current Outpatient Medications  Medication Sig Dispense Refill   aspirin EC 81 MG tablet Take 81 mg by mouth daily.     atorvastatin (LIPITOR) 80 MG tablet TAKE 1 TABLET BY MOUTH DAILY AT 6 PM. 90 tablet 0   carvedilol (COREG) 12.5 MG tablet TAKE 1 TABLET (12.5MG TOTAL) BY MOUTH TWICE A DAY WITH MEALS 180 tablet 1   clopidogrel (PLAVIX) 75 MG tablet Take 1 tablet (75 mg total) by mouth daily. 90 tablet 3   Dulaglutide (TRULICITY) 1.5 0000000 SOPN INJECT 1.5 MG INTO THE SKIN ONCE A WEEK. 6 mL 0   famotidine (PEPCID) 20 MG tablet Take 1 tablet (20 mg total) by mouth 2 (two) times daily. 180 tablet 3   fenofibrate (TRICOR) 145 MG tablet Take 1 tablet (145 mg total) by mouth daily. 90 tablet 1   fluticasone (FLONASE) 50 MCG/ACT nasal spray Place 1 spray into both nostrils daily. 16 mL 11   furosemide (LASIX) 20 MG tablet TAKE 1 TABLET BY MOUTH EVERY DAY 90 tablet 0   glucose blood (ACCU-CHEK GUIDE) test strip Check BS in the morning and at bedtime Dx E11.8 200 strip 3   JARDIANCE 25 MG TABS tablet TAKE 1 TABLET (25 MG TOTAL) BY MOUTH DAILY. 90 tablet 0   Omega-3 Fatty  Acids (FISH OIL) 1000 MG CAPS Take 2,000 mg by mouth daily.     potassium chloride (KLOR-CON) 10 MEQ tablet Take 1 tablet (10 mEq total) by mouth daily. 90 tablet 1   sildenafil (REVATIO) 20 MG tablet Take 1-5 tablets (20-100 mg total) by mouth daily as needed. 50 tablet 1   spironolactone (ALDACTONE) 25 MG tablet TAKE 1/2 TABLET BY MOUTH EVERY DAY 45 tablet 3   BD PEN NEEDLE NANO 2ND GEN 32G X 4 MM MISC Use daily with insulin Dx E11.9, Z79.4 (Patient not taking: Reported on 07/01/2022) 100  each 3   Blood Glucose Monitoring Suppl (ACCU-CHEK AVIVA PLUS) w/Device KIT 1 each by Does not apply route 4 (four) times daily. (Patient not taking: Reported on 07/01/2022) 1 kit 1   Blood Glucose Monitoring Suppl (ACCU-CHEK AVIVA) device Test BS BID Dx E11.69 (Patient not taking: Reported on 07/01/2022) 1 each 0   losartan (COZAAR) 25 MG tablet TAKE 1 TABLET BY MOUTH EVERYDAY AT BEDTIME (Patient not taking: Reported on 07/01/2022) 90 tablet 0   meloxicam (MOBIC) 7.5 MG tablet Take 1 tablet by mouth 2 (two) times daily as needed. (Patient not taking: Reported on 07/01/2022)     methocarbamol (ROBAXIN) 500 MG tablet Take 500 mg by mouth 2 (two) times daily. (Patient not taking: Reported on 07/01/2022)     No current facility-administered medications for this visit.      ___________________________________________________________________ Objective   Exam:  BP 138/80   Pulse 87   Ht 6' 4"$  (1.93 m)   Wt 273 lb (123.8 kg)   BMI 33.23 kg/m  Wt Readings from Last 3 Encounters:  07/01/22 273 lb (123.8 kg)  02/27/22 270 lb (122.5 kg)  11/15/21 255 lb (115.7 kg)    General: Not acutely ill-appearing.  Pleasant and conversational.  Ambulatory, gets on exam table easily. Eyes: sclera anicteric, no redness ENT: oral mucosa moist without lesions, no cervical or supraclavicular lymphadenopathy CV: Regular without appreciable murmur, no JVD, no peripheral edema Resp: clear to auscultation bilaterally, normal RR  and effort noted GI: soft, no tenderness, with active bowel sounds. No guarding or palpable organomegaly noted. Skin; warm and dry, no rash or jaundice noted Neuro: awake, alert and oriented x 3. Normal gross motor function and fluent speech  Labs:     Latest Ref Rng & Units 02/27/2022    3:38 PM 08/21/2021    1:08 PM 02/15/2021   11:10 AM  CBC  WBC 3.4 - 10.8 x10E3/uL 8.9  8.8  9.6   Hemoglobin 13.0 - 17.7 g/dL 15.7  14.4  15.3   Hematocrit 37.5 - 51.0 % 46.7  44.2  46.9   Platelets 150 - 450 x10E3/uL 276  263  273      Last cardiology office [name] 09/18/2021 was reviewed.  This includes description of the patient's history of ischemic cardiomyopathy with an EF of 35 to 40% on most recent echocardiogram July 2022.  (This was an improvement from his initial 20 to 25% when he presented with an MI in 2020)  No further coronary intervention since the staged intervention of his RCA at the time of MI in July 2020.  Patient was reportedly without chest pain at the time of last cardiology visit.  Assessment: Encounter Diagnoses  Name Primary?   History of colonic polyps Yes   Long term (current) use of antithrombotics/antiplatelets   Coronary artery disease with prior intervention  He is clinically stable for surveillance colonoscopy.  Will be done at [facility] endoscopy lab due to increased sedation risk related to his CHF.  Additionally increased risk of post polypectomy bleeding in a patient on DAPT.  Plavix will be held 5 days before, which she was able to do for his last procedure, and his cardiac condition has been stable since then without further intervention.  We will clear this with his cardiologist to be certain.Evan [name] understands the small risk of cardiovascular event during the brief holding the Plavix (clopidogrel), and he will remain on aspirin without interruption.  Agreeable to colonoscopy after discussion of procedure and risks.  The benefits and risks of the planned procedure  were described in detail with the patient or (when appropriate) their health care proxy.  Risks were outlined as including, but not limited to, bleeding, infection, perforation, adverse medication reaction leading to cardiac or pulmonary decompensation, pancreatitis (if ERCP).  The limitation of incomplete mucosal visualization was also discussed.  No guarantees or warranties were given.  (Preprocedure hold of GLP-1 agonist per protocol)   30 minutes were spent on this encounter (including chart review, history/exam, counseling/coordination of care, and documentation) > 50% of that time was spent on counseling and coordination of care.   Nelida Meuse III  CC: Referring provider noted above

## 2022-07-02 ENCOUNTER — Telehealth: Payer: Self-pay | Admitting: Family Medicine

## 2022-07-02 NOTE — Telephone Encounter (Signed)
Left message making pt aware that we do not have samples and that he should call other pharmacy's of his choosing to see if they have in stock. Then let us know and we will send it in for him.

## 2022-07-02 NOTE — Telephone Encounter (Signed)
Patient has not been able to get Dulaglutide (TRULICITY) 1.5 0000000 SOPN from the pharmacy and would like to know if we have any samples. Please call back.

## 2022-07-03 NOTE — Telephone Encounter (Signed)
Sample with name and dob up front in fridge. Left message informing pt of Dettinger's recommendations and to call back if needed.

## 2022-07-03 NOTE — Telephone Encounter (Signed)
Pt rc says that he was told to leave his number at difference pharmacies and they would let him in when in stock. Pt says that he has been out of medicine for 5 weeks.

## 2022-07-03 NOTE — Telephone Encounter (Signed)
Can we see if they have an Ozempic sample for him, he would typically be about 1 mg in the dose but he could take 7.5 as well just to have anything.  Kidney check to see if there is an Ozempic sample that is extra that he could have one of them.

## 2022-07-04 ENCOUNTER — Telehealth: Payer: Self-pay

## 2022-07-04 NOTE — Telephone Encounter (Signed)
Inbound call from patient.   Advised patient of previous message to hold plavix 5 days prior.   Patient still needing to be advised. Please advise at your earliest convince.   Thank you

## 2022-07-04 NOTE — Telephone Encounter (Signed)
Left message to return call for Plavix instructions. See cardiac clearance on 07-02-2022

## 2022-07-04 NOTE — Telephone Encounter (Signed)
Left message to return call for Plavix instructions.

## 2022-07-04 NOTE — Telephone Encounter (Signed)
Confirmed with the patient that he understood the requirements to hold the Plavix before the procedure

## 2022-07-21 ENCOUNTER — Encounter (HOSPITAL_COMMUNITY): Payer: Self-pay | Admitting: Gastroenterology

## 2022-07-28 ENCOUNTER — Telehealth: Payer: Self-pay | Admitting: Gastroenterology

## 2022-07-28 ENCOUNTER — Ambulatory Visit (HOSPITAL_COMMUNITY): Admission: RE | Admit: 2022-07-28 | Payer: Medicaid Other | Source: Home / Self Care | Admitting: Gastroenterology

## 2022-07-28 SURGERY — COLONOSCOPY WITH PROPOFOL
Anesthesia: Monitor Anesthesia Care

## 2022-07-28 NOTE — Progress Notes (Signed)
Attempted to call patient on cell phone and home phone . A person answered the home phone and asked why calling so early and then they hung up. Call placed at 820am. Patient a no show for appointmnent at [facility]. Cellphone no room for messages

## 2022-07-28 NOTE — Telephone Encounter (Signed)
This patient did not show for his scheduled colonoscopy with me today in the [facility] outpatient endoscopy department.  He also could not be reached by the endoscopy staff.   Please attempt to contact this patient. If you reach him, please tell him I am offering one chance to reschedule this procedure.  Hospital outpatient endoscopy slots are in short supply and they go wasted when the patient does not show. He has also had multiple prior clinic no-shows.   If he chooses to reschedule his colonoscopy with me and he either does not show for that procedure or cancels with less than a week notice, then he will be discharged from this practice.  - HD

## 2022-07-29 ENCOUNTER — Other Ambulatory Visit: Payer: Self-pay

## 2022-07-30 ENCOUNTER — Other Ambulatory Visit: Payer: Self-pay

## 2022-07-30 DIAGNOSIS — Z1211 Encounter for screening for malignant neoplasm of colon: Secondary | ICD-10-CM

## 2022-07-30 DIAGNOSIS — Z86010 Personal history of colonic polyps: Secondary | ICD-10-CM

## 2022-07-30 DIAGNOSIS — Z7902 Long term (current) use of antithrombotics/antiplatelets: Secondary | ICD-10-CM

## 2022-07-30 MED ORDER — NA SULFATE-K SULFATE-MG SULF 17.5-3.13-1.6 GM/177ML PO SOLN: 1.0000 | Freq: Once | ORAL | 0 refills | Status: AC

## 2022-07-30 NOTE — Telephone Encounter (Signed)
Mr Holthusen has been contacted. Its unclear after speaking with him if he no showed to the colonoscopy because he was sick or from the bowel prep that gave him diarrhea and nausea. He explained to me me that after drinking the prep he vomited and had diarrhea. Then he explained that all the kids in his house had some stomach flu. Mr Puello has rescheduled to 09-25-2022 at 9:15am. New instructions have been mailed to his home address and new bowel prep has been sent to the pharmacy. Would you like me to receive new cardiac clearance? Please advise.  Case number [id number]

## 2022-07-30 NOTE — Telephone Encounter (Signed)
Thank you for contacting him.  New cardiology clearance is not needed.  Same prep-procedure plavix instructions as the exam that had been scheduled for 07/28/22    - HD

## 2022-07-30 NOTE — Telephone Encounter (Signed)
Instructions and prep have been mailed to home address on file.

## 2022-08-07 ENCOUNTER — Encounter: Payer: Self-pay | Admitting: Family Medicine

## 2022-08-07 ENCOUNTER — Ambulatory Visit: Payer: Medicaid Other | Admitting: Family Medicine

## 2022-08-07 VITALS — BP 105/73 | HR 78 | Ht 76.0 in | Wt 275.0 lb

## 2022-08-07 DIAGNOSIS — Z125 Encounter for screening for malignant neoplasm of prostate: Secondary | ICD-10-CM | POA: Diagnosis not present

## 2022-08-07 DIAGNOSIS — E1169 Type 2 diabetes mellitus with other specified complication: Secondary | ICD-10-CM | POA: Diagnosis not present

## 2022-08-07 DIAGNOSIS — L84 Corns and callosities: Secondary | ICD-10-CM | POA: Diagnosis not present

## 2022-08-07 DIAGNOSIS — E118 Type 2 diabetes mellitus with unspecified complications: Secondary | ICD-10-CM

## 2022-08-07 DIAGNOSIS — M1711 Unilateral primary osteoarthritis, right knee: Secondary | ICD-10-CM

## 2022-08-07 DIAGNOSIS — E11628 Type 2 diabetes mellitus with other skin complications: Secondary | ICD-10-CM | POA: Diagnosis not present

## 2022-08-07 DIAGNOSIS — E1159 Type 2 diabetes mellitus with other circulatory complications: Secondary | ICD-10-CM

## 2022-08-07 DIAGNOSIS — E785 Hyperlipidemia, unspecified: Secondary | ICD-10-CM

## 2022-08-07 DIAGNOSIS — I152 Hypertension secondary to endocrine disorders: Secondary | ICD-10-CM

## 2022-08-07 LAB — BAYER DCA HB A1C WAIVED: HB A1C (BAYER DCA - WAIVED): 8.3 % — ABNORMAL HIGH (ref 4.8–5.6)

## 2022-08-07 MED ORDER — FAMOTIDINE 20 MG PO TABS: 20.0000 mg | ORAL_TABLET | Freq: Two times a day (BID) | ORAL | 3 refills | Status: DC

## 2022-08-07 MED ORDER — FENOFIBRATE 145 MG PO TABS: 145.0000 mg | ORAL_TABLET | Freq: Every day | ORAL | 3 refills | Status: DC

## 2022-08-07 MED ORDER — CARVEDILOL 12.5 MG PO TABS: 12.5000 mg | ORAL_TABLET | Freq: Two times a day (BID) | ORAL | 3 refills | Status: DC

## 2022-08-07 MED ORDER — EMPAGLIFLOZIN 25 MG PO TABS: 25.0000 mg | ORAL_TABLET | Freq: Every day | ORAL | 3 refills | Status: DC

## 2022-08-07 MED ORDER — SEMAGLUTIDE(0.25 OR 0.5MG/DOS) 2 MG/3ML ~~LOC~~ SOPN: 0.5000 mg | PEN_INJECTOR | SUBCUTANEOUS | 3 refills | Status: DC

## 2022-08-07 MED ORDER — FUROSEMIDE 20 MG PO TABS: 20.0000 mg | ORAL_TABLET | Freq: Every day | ORAL | 3 refills | Status: DC

## 2022-08-07 MED ORDER — LOSARTAN POTASSIUM 25 MG PO TABS: 25.0000 mg | ORAL_TABLET | Freq: Every day | ORAL | 3 refills | Status: DC

## 2022-08-07 MED ORDER — ATORVASTATIN CALCIUM 80 MG PO TABS: 80.0000 mg | ORAL_TABLET | Freq: Every day | ORAL | 3 refills | Status: DC

## 2022-08-07 NOTE — Progress Notes (Signed)
BP 105/73   Pulse 78   Ht 6\' 4"  (1.93 m)   Wt 275 lb (124.7 kg)   SpO2 96%   BMI 33.47 kg/m    Subjective:   Patient ID: Evan [name], male    DOB: May 06, 1966, 57 y.o.   MRN: [id number]  HPI: Evan [name] is a 57 y.o. male presenting on 08/07/2022 for Medical Management of Chronic Issues   HPI Type 2 diabetes mellitus Patient comes in today for recheck of his diabetes. Patient has been currently taking Jardiance and Trulicity although he has been out for 9 weeks because it has been out of stock and his insurance no longer covers it, will switch to Ozempic. Patient is currently on an ACE inhibitor/ARB. Patient has not seen an ophthalmologist this year. Patient denies any issues with their feet. The symptom started onset as an adult hypertension and hyperlipidemia and CAD ARE RELATED TO DM   Hypertension Patient is currently on Jardiance and carvedilol and losartan and furosemide, and their blood pressure today is 105/73. Patient denies any lightheadedness or dizziness. Patient denies headaches, blurred vision, chest pains, shortness of breath, or weakness. Denies any side effects from medication and is content with current medication.   Hyperlipidemia Patient is coming in for recheck of his hyperlipidemia. The patient is currently taking fish oils and atorvastatin and fenofibrate. They deny any issues with myalgias or history of liver damage from it. They deny any focal numbness or weakness or chest pain.   Right knee pain Patient has right knee pain and has had meniscal injuries in the past and has known osteoarthritis and has seen an orthopedic for but is been causing some more issues recently and he wants to go back to see the orthopedic.  Relevant past medical, surgical, family and social history reviewed and updated as indicated. Interim medical history since our last visit reviewed. Allergies and medications reviewed and updated.  Review of Systems  Constitutional:   Negative for chills and fever.  Eyes:  Negative for visual disturbance.  Respiratory:  Negative for shortness of breath and wheezing.   Cardiovascular:  Negative for chest pain and leg swelling.  Musculoskeletal:  Positive for arthralgias. Negative for back pain and gait problem.  Skin:  Negative for color change and rash.  Neurological:  Negative for dizziness.  All other systems reviewed and are negative.   Per HPI unless specifically indicated above   Allergies as of 08/07/2022   No Known Allergies      Medication List        Accurate as of August 07, 2022 11:55 AM. If you have any questions, ask your nurse or doctor.          STOP taking these medications    Trulicity 1.5 0000000 Sopn Generic drug: Dulaglutide Stopped by: Fransisca Kaufmann Tieasha Larsen, MD       TAKE these medications    Accu-Chek Aviva device Test BS BID Dx E11.69   Accu-Chek Aviva Plus w/Device Kit 1 each by Does not apply route 4 (four) times daily.   Accu-Chek Guide test strip Generic drug: glucose blood Check BS in the morning and at bedtime Dx E11.8   aspirin EC 81 MG tablet Take 81 mg by mouth daily.   atorvastatin 80 MG tablet Commonly known as: LIPITOR Take 1 tablet (80 mg total) by mouth daily. What changed: when to take this Changed by: Worthy Rancher, MD   BD Pen Needle Nano 2nd Gen 32G X  4 MM Misc Generic drug: Insulin Pen Needle Use daily with insulin Dx E11.9, Z79.4   carvedilol 12.5 MG tablet Commonly known as: COREG Take 1 tablet (12.5 mg total) by mouth 2 (two) times daily with a meal. What changed: See the new instructions. Changed by: Fransisca Kaufmann Dona Walby, MD   clopidogrel 75 MG tablet Commonly known as: PLAVIX Take 1 tablet (75 mg total) by mouth daily.   empagliflozin 25 MG Tabs tablet Commonly known as: Jardiance Take 1 tablet (25 mg total) by mouth daily.   famotidine 20 MG tablet Commonly known as: Pepcid Take 1 tablet (20 mg total) by mouth 2 (two) times  daily.   fenofibrate 145 MG tablet Commonly known as: TRICOR Take 1 tablet (145 mg total) by mouth daily.   Fish Oil 1000 MG Caps Take 2,000 mg by mouth daily.   fluticasone 50 MCG/ACT nasal spray Commonly known as: FLONASE Place 1 spray into both nostrils daily.   furosemide 20 MG tablet Commonly known as: LASIX Take 1 tablet (20 mg total) by mouth daily.   losartan 25 MG tablet Commonly known as: COZAAR Take 1 tablet (25 mg total) by mouth daily. What changed: See the new instructions. Changed by: Fransisca Kaufmann Su Duma, MD   meloxicam 7.5 MG tablet Commonly known as: MOBIC Take 1 tablet by mouth 2 (two) times daily as needed.   methocarbamol 500 MG tablet Commonly known as: ROBAXIN Take 500 mg by mouth 2 (two) times daily.   potassium chloride 10 MEQ tablet Commonly known as: KLOR-CON Take 1 tablet (10 mEq total) by mouth daily.   Semaglutide(0.25 or 0.5MG /DOS) 2 MG/3ML Sopn Inject 0.5 mg into the skin once a week. Started by: Worthy Rancher, MD   sildenafil 20 MG tablet Commonly known as: REVATIO Take 1-5 tablets (20-100 mg total) by mouth daily as needed.   spironolactone 25 MG tablet Commonly known as: ALDACTONE TAKE 1/2 TABLET BY MOUTH EVERY DAY         Objective:   BP 105/73   Pulse 78   Ht 6\' 4"  (1.93 m)   Wt 275 lb (124.7 kg)   SpO2 96%   BMI 33.47 kg/m   Wt Readings from Last 3 Encounters:  08/07/22 275 lb (124.7 kg)  07/01/22 273 lb (123.8 kg)  02/27/22 270 lb (122.5 kg)    Physical Exam Vitals and nursing note reviewed.  Constitutional:      General: He is not in acute distress.    Appearance: He is well-developed. He is not diaphoretic.  Eyes:     General: No scleral icterus.    Conjunctiva/sclera: Conjunctivae normal.  Neck:     Thyroid: No thyromegaly.  Cardiovascular:     Rate and Rhythm: Normal rate and regular rhythm.     Heart sounds: Normal heart sounds. No murmur heard. Pulmonary:     Effort: Pulmonary effort is  normal. No respiratory distress.     Breath sounds: Normal breath sounds. No wheezing.  Musculoskeletal:        General: No swelling. Normal range of motion.     Cervical back: Neck supple.     Right knee: Crepitus present. No erythema. Tenderness present over the medial joint line and lateral joint line. No LCL laxity, MCL laxity, ACL laxity or PCL laxity.  Lymphadenopathy:     Cervical: No cervical adenopathy.  Skin:    General: Skin is warm and dry.     Findings: No rash.  Neurological:  Mental Status: He is alert and oriented to person, place, and time.     Coordination: Coordination normal.  Psychiatric:        Behavior: Behavior normal.       Assessment & Plan:   Problem List Items Addressed This Visit       Cardiovascular and Mediastinum   Hypertension associated with diabetes ([place])   Relevant Medications   atorvastatin (LIPITOR) 80 MG tablet   carvedilol (COREG) 12.5 MG tablet   fenofibrate (TRICOR) 145 MG tablet   furosemide (LASIX) 20 MG tablet   empagliflozin (JARDIANCE) 25 MG TABS tablet   losartan (COZAAR) 25 MG tablet   Semaglutide,0.25 or 0.5MG /DOS, 2 MG/3ML SOPN   Other Relevant Orders   CBC with Differential/Platelet   CMP14+EGFR   Lipid panel   PSA, total and free   Bayer DCA Hb A1c Waived     Endocrine   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)   Relevant Medications   atorvastatin (LIPITOR) 80 MG tablet   fenofibrate (TRICOR) 145 MG tablet   empagliflozin (JARDIANCE) 25 MG TABS tablet   losartan (COZAAR) 25 MG tablet   Semaglutide,0.25 or 0.5MG /DOS, 2 MG/3ML SOPN   Hyperlipidemia associated with type 2 diabetes mellitus (HCC)   Relevant Medications   atorvastatin (LIPITOR) 80 MG tablet   carvedilol (COREG) 12.5 MG tablet   fenofibrate (TRICOR) 145 MG tablet   furosemide (LASIX) 20 MG tablet   empagliflozin (JARDIANCE) 25 MG TABS tablet   losartan (COZAAR) 25 MG tablet   Semaglutide,0.25 or 0.5MG /DOS, 2 MG/3ML SOPN   Other  Relevant Orders   CBC with Differential/Platelet   CMP14+EGFR   Lipid panel   PSA, total and free   Bayer DCA Hb A1c Waived   Type 2 diabetes mellitus with complications (HCC) - Primary   Relevant Medications   atorvastatin (LIPITOR) 80 MG tablet   empagliflozin (JARDIANCE) 25 MG TABS tablet   losartan (COZAAR) 25 MG tablet   Semaglutide,0.25 or 0.5MG /DOS, 2 MG/3ML SOPN   Other Relevant Orders   CBC with Differential/Platelet   CMP14+EGFR   Lipid panel   PSA, total and free   Bayer DCA Hb A1c Waived   Other Visit Diagnoses     Primary osteoarthritis of right knee       Relevant Orders   Ambulatory referral to Orthopedic Surgery       Will switch from Trulicity to Ozempic to see if it is better for [place], his A1c was 8.3 which was, he has been out of the Trulicity for 9 weeks and that is likely the cause, and we will plan to switch to Ozempic will help with that.  Also placed referral to orthopedic. Follow up plan: Return in about 3 months (around 11/07/2022), or if symptoms worsen or fail to improve, for Diabetes and hypertension and cholesterol.  Counseling provided for all of the vaccine components Orders Placed This Encounter  Procedures   CBC with Differential/Platelet   CMP14+EGFR   Lipid panel   PSA, total and free   Bayer DCA Hb A1c Waived   Ambulatory referral to [place] Fatime Biswell, MD [facility] 08/07/2022, 11:55 AM

## 2022-08-08 LAB — CBC WITH DIFFERENTIAL/PLATELET
Basophils Absolute: 0.1 10*3/uL (ref 0.0–0.2)
Basos: 1 %
EOS (ABSOLUTE): 0.2 10*3/uL (ref 0.0–0.4)
Eos: 4 %
Hematocrit: 47.8 % (ref 37.5–51.0)
Hemoglobin: 16 g/dL (ref 13.0–17.7)
Immature Grans (Abs): 0 10*3/uL (ref 0.0–0.1)
Immature Granulocytes: 0 %
Lymphocytes Absolute: 1.4 10*3/uL (ref 0.7–3.1)
Lymphs: 23 %
MCH: 30.8 pg (ref 26.6–33.0)
MCHC: 33.5 g/dL (ref 31.5–35.7)
MCV: 92 fL (ref 79–97)
Monocytes Absolute: 0.8 10*3/uL (ref 0.1–0.9)
Monocytes: 12 %
Neutrophils Absolute: 3.7 10*3/uL (ref 1.4–7.0)
Neutrophils: 60 %
Platelets: 208 10*3/uL (ref 150–450)
RBC: 5.2 x10E6/uL (ref 4.14–5.80)
RDW: 12.6 % (ref 11.6–15.4)
WBC: 6.2 10*3/uL (ref 3.4–10.8)

## 2022-08-08 LAB — CMP14+EGFR
ALT: 15 IU/L (ref 0–44)
AST: 17 IU/L (ref 0–40)
Albumin/Globulin Ratio: 1.6 (ref 1.2–2.2)
Albumin: 4.1 g/dL (ref 3.8–4.9)
Alkaline Phosphatase: 94 IU/L (ref 44–121)
BUN/Creatinine Ratio: 11 (ref 9–20)
BUN: 12 mg/dL (ref 6–24)
Bilirubin Total: 0.3 mg/dL (ref 0.0–1.2)
CO2: 23 mmol/L (ref 20–29)
Calcium: 8.9 mg/dL (ref 8.7–10.2)
Chloride: 104 mmol/L (ref 96–106)
Creatinine, Ser: 1.13 mg/dL (ref 0.76–1.27)
Globulin, Total: 2.5 g/dL (ref 1.5–4.5)
Glucose: 197 mg/dL — ABNORMAL HIGH (ref 70–99)
Potassium: 4.2 mmol/L (ref 3.5–5.2)
Sodium: 142 mmol/L (ref 134–144)
Total Protein: 6.6 g/dL (ref 6.0–8.5)
eGFR: 76 mL/min/{1.73_m2} (ref >59)

## 2022-08-08 LAB — PSA, TOTAL AND FREE
PSA, Free Pct: 30 %
PSA, Free: 0.06 ng/mL
Prostate Specific Ag, Serum: 0.2 ng/mL (ref 0.0–4.0)

## 2022-08-08 LAB — LIPID PANEL
Chol/HDL Ratio: 3.8 ratio (ref 0.0–5.0)
Cholesterol, Total: 126 mg/dL (ref 100–199)
HDL: 33 mg/dL — ABNORMAL LOW (ref >39)
LDL Chol Calc (NIH): 68 mg/dL (ref 0–99)
Triglycerides: 145 mg/dL (ref 0–149)
VLDL Cholesterol Cal: 25 mg/dL (ref 5–40)

## 2022-08-12 ENCOUNTER — Telehealth: Payer: Self-pay

## 2022-08-12 NOTE — Telephone Encounter (Signed)
Evan [name] (Key: [facility]) Rx #: [id number] Ozempic (0.25 or 0.5 MG/DOSE) 2MG /3ML pen-injectors Form [employer] [place] Form [phone] NCPDP) Created 5 days ago Sent to Plan 4 minutes ago Plan Response 3 minutes ago Submit Clinical Questions less than a minute ago Determination Wait for Determination Please wait for [facility] [facility] to return a determination.

## 2022-08-15 NOTE — Telephone Encounter (Signed)
Patient Advocate Encounter  Prior Authorization for Ozempic has been approved.    PA# [id number] Effective dates: 08/12/22 through 08/12/23

## 2022-08-20 ENCOUNTER — Ambulatory Visit: Payer: Medicaid Other | Admitting: Orthopedic Surgery

## 2022-08-27 ENCOUNTER — Ambulatory Visit (INDEPENDENT_AMBULATORY_CARE_PROVIDER_SITE_OTHER): Payer: Medicaid Other | Admitting: Orthopedic Surgery

## 2022-08-27 ENCOUNTER — Encounter: Payer: Self-pay | Admitting: Orthopedic Surgery

## 2022-08-27 ENCOUNTER — Other Ambulatory Visit (INDEPENDENT_AMBULATORY_CARE_PROVIDER_SITE_OTHER): Payer: Medicaid Other

## 2022-08-27 VITALS — BP 136/84 | HR 85 | Ht 76.5 in | Wt 273.0 lb

## 2022-08-27 DIAGNOSIS — M1711 Unilateral primary osteoarthritis, right knee: Secondary | ICD-10-CM

## 2022-08-27 DIAGNOSIS — M25561 Pain in right knee: Secondary | ICD-10-CM

## 2022-08-27 DIAGNOSIS — G8929 Other chronic pain: Secondary | ICD-10-CM

## 2022-08-27 NOTE — Patient Instructions (Signed)

## 2022-08-28 NOTE — Progress Notes (Signed)
New Patient Visit  Assessment: Evan [name] is a 57 y.o. male with the following: 1. Arthritis of right knee  Plan: Evan [name] has progressively worsening pain in his right knee.  Radiographs demonstrate moderate degenerative changes.  We discussed multiple treatment options, and he would like to proceed with a steroid injection.  We can also consider hyaluronic acid injections in the future.  Procedure was completed in clinic without issues.  He will follow-up as needed.  Procedure note injection Right knee joint   Verbal consent was obtained to inject the right knee joint  Timeout was completed to confirm the site of injection.  The skin was prepped with alcohol and ethyl chloride was sprayed at the injection site.  A 21-gauge needle was used to inject 40 mg of Depo-Medrol and 1% lidocaine (3 cc) into the right knee using an anterolateral approach.  There were no complications. A sterile bandage was applied.   Follow-up: No follow-ups on file.  Subjective:  Chief Complaint  Patient presents with   Knee Pain    R knee gotten worse in the past 6 mos to yr.   [facility] [id number]    History of Present Illness: Evan [name] is a 57 y.o. male who presents for evaluation of right knee pain.  He states that pain in his right knee off-and-on for several years.  It has been progressively worsening over the past 6 months or so.  No recent injuries.  He has had injections several years ago, but nothing recent.  Occasional over-the-counter pain medications.   Review of Systems: No fevers or chills No numbness or tingling No chest pain No shortness of breath No bowel or bladder dysfunction No GI distress No headaches   Medical History:  Past Medical History:  Diagnosis Date   Arthritis    Atrial fibrillation    CHF (congestive heart failure)    Diabetes mellitus without complication    Gout    Heart attack 10/2018   Hyperlipidemia    Hypertension    Morbid obesity  12/22/2016   Sleep apnea    had test twice unable to complete    Past Surgical History:  Procedure Laterality Date   ANKLE ARTHROSCOPY Right    COLONOSCOPY WITH PROPOFOL N/A 09/18/2020   Procedure: COLONOSCOPY WITH PROPOFOL;  Surgeon: Sherrilyn Rist, MD;  Location: [facility];  Service: Gastroenterology;  Laterality: N/A;  09-18-2020   CORONARY STENT INTERVENTION N/A 11/24/2018   Procedure: CORONARY STENT INTERVENTION;  Surgeon: Iran Ouch, MD;  Location: [facility];  Service: Cardiovascular;  Laterality: N/A;  prox and mid LAD   CORONARY STENT INTERVENTION N/A 11/26/2018   Procedure: CORONARY STENT INTERVENTION;  Surgeon: Iran Ouch, MD;  Location: [facility] CV [facility];  Service: Cardiovascular;  Laterality: N/A;   CYST REMOVAL HAND     KNEE ARTHROSCOPY Left    LEFT HEART CATH AND CORONARY ANGIOGRAPHY N/A 11/24/2018   Procedure: LEFT HEART CATH AND CORONARY ANGIOGRAPHY;  Surgeon: Iran Ouch, MD;  Location: [facility];  Service: Cardiovascular;  Laterality: N/A;   POLYPECTOMY  09/18/2020   Procedure: POLYPECTOMY;  Surgeon: Sherrilyn Rist, MD;  Location: [facility];  Service: Gastroenterology;;    Family History  Problem Relation Age of Onset   Diabetes Mother    Stroke Mother    Early death Father    Heart attack Father        died from heart attack at  the age of 106   Alcohol abuse Father    Heart disease Father    Breast cancer Sister    Hypertension Brother    Heart attack Maternal Grandfather        died from heart attack at the age of 79   Early death Paternal Grandfather    Heart attack Paternal Grandfather    Colon cancer Neg Hx    Esophageal cancer Neg Hx    Rectal cancer Neg Hx    Stomach cancer Neg Hx    Social History   Tobacco Use   Smoking status: Never   Smokeless tobacco: Never  Vaping Use   Vaping Use: Never used  Substance Use Topics   Alcohol use: Yes    Alcohol/week: 12.0 standard drinks of alcohol    Types: 12  Cans of beer per week    Comment: 5 beers a week   Drug use: No    No Known Allergies  No outpatient medications have been marked as taking for the 08/27/22 encounter (Office Visit) with Oliver Barre, MD.    Objective: BP 136/84   Pulse 85   Ht 6' 4.5" (1.943 m)   Wt 273 lb (123.8 kg)   BMI 32.80 kg/m   Physical Exam:  General: Alert and oriented. and No acute distress. Gait: Right sided antalgic gait.  Right knee without swelling.  Tenderness palpation of the medial joint line.  Positive crepitus with range of motion testing.  Range of motion from 3-120 degrees.  No obvious laxity varus valgus stress.  Negative Lachman.   IMAGING: I personally ordered and reviewed the following images   X-rays of the right knee were obtained in clinic today.  No acute injuries are noted.  There is loss of joint space within all 3 compartments.  There are associated osteophytes.  No bony lesions.  Impression: Moderate right knee degenerative changes, with loss of joint space and osteophytes in all 3 compartments.   New Medications:  No orders of the defined types were placed in this encounter.     Oliver Barre, MD  08/28/2022 11:26 AM

## 2022-09-17 ENCOUNTER — Telehealth: Payer: Self-pay

## 2022-09-17 NOTE — Telephone Encounter (Signed)
Attempted to reach patent twice on his home phone and mobile phone to confirm upcoming colonoscopy at [facility] on 09/25/22 at 9:15 am with Dr. Myrtie Neither. Mobile phone goes straight to vm and his vm is full. Home phone rings then goes to a fast busy signal. Will attempt to reach patient at a different time.

## 2022-09-18 ENCOUNTER — Encounter (HOSPITAL_COMMUNITY): Payer: Self-pay | Admitting: Gastroenterology

## 2022-09-19 NOTE — Telephone Encounter (Signed)
Called and spoke with patient to confirm colonoscopy procedure appt at [facility] with Dr. Myrtie Neither on 09/25/22 at 9:15 am. Pt confirmed that he has his prep and instructions. Pt is aware that he will need to arrive at [facility] by 7:45 am with a care partner. Pt verbalized understanding and had no concerns at the end of the call.

## 2022-09-25 ENCOUNTER — Ambulatory Visit (HOSPITAL_COMMUNITY): Admission: RE | Admit: 2022-09-25 | Payer: Medicaid Other | Source: Home / Self Care | Admitting: Gastroenterology

## 2022-09-25 ENCOUNTER — Encounter (HOSPITAL_COMMUNITY): Payer: Self-pay | Admitting: Certified Registered Nurse Anesthetist

## 2022-09-25 ENCOUNTER — Telehealth: Payer: Self-pay

## 2022-09-25 SURGERY — COLONOSCOPY WITH PROPOFOL
Anesthesia: Monitor Anesthesia Care

## 2022-09-25 MED ORDER — PROPOFOL 1000 MG/100ML IV EMUL
INTRAVENOUS | Status: AC
  Filled 2022-09-25: qty 100

## 2022-09-25 NOTE — Progress Notes (Signed)
Patient no show for colonoscopy . Left message on voicemail when attempted to call. Asked patient to call us back today and let us know coming.

## 2022-09-25 NOTE — Telephone Encounter (Signed)
The form and letter are in your office to sign please.

## 2022-09-25 NOTE — Telephone Encounter (Signed)
-----   Message from Charlie Pitter III, MD sent at 09/25/2022 11:01 AM EDT ----- Regarding: 3rd no show for [facility] colonoscopy This patient did not show for his colonoscopy at [facility] long today.  This is the third time, so per my 07/28/2022 telephone note, he is to be discharged from [facility].  Please prepare discharge letter.  -HD

## 2022-09-26 ENCOUNTER — Telehealth: Payer: Self-pay | Admitting: *Deleted

## 2022-09-26 NOTE — Telephone Encounter (Signed)
-----   Message from Henry L Danis III, MD sent at 09/25/2022 11:01 AM EDT ----- Regarding: 3rd no show for [facility] colonoscopy This patient did not show for his colonoscopy at [place] today.  This is the third time, so per my 07/28/2022 telephone note, he is to be discharged from [facility].  Please prepare discharge letter.  -HD  

## 2022-10-01 ENCOUNTER — Telehealth: Payer: Self-pay | Admitting: Gastroenterology

## 2022-10-01 NOTE — Telephone Encounter (Signed)
Patient dismissed from [facility] and all providers practicing at this clinic due to patient / provider relationship being compromised. 09/25/22

## 2022-10-06 DIAGNOSIS — R059 Cough, unspecified: Secondary | ICD-10-CM | POA: Diagnosis not present

## 2022-10-06 DIAGNOSIS — E119 Type 2 diabetes mellitus without complications: Secondary | ICD-10-CM | POA: Diagnosis not present

## 2022-10-06 DIAGNOSIS — I5022 Chronic systolic (congestive) heart failure: Secondary | ICD-10-CM | POA: Diagnosis not present

## 2022-10-06 DIAGNOSIS — E785 Hyperlipidemia, unspecified: Secondary | ICD-10-CM | POA: Diagnosis not present

## 2022-10-06 DIAGNOSIS — I5020 Unspecified systolic (congestive) heart failure: Secondary | ICD-10-CM | POA: Diagnosis not present

## 2022-10-06 DIAGNOSIS — J690 Pneumonitis due to inhalation of food and vomit: Secondary | ICD-10-CM | POA: Diagnosis not present

## 2022-10-06 DIAGNOSIS — I252 Old myocardial infarction: Secondary | ICD-10-CM | POA: Diagnosis not present

## 2022-10-06 DIAGNOSIS — J189 Pneumonia, unspecified organism: Secondary | ICD-10-CM | POA: Diagnosis not present

## 2022-10-06 DIAGNOSIS — I10 Essential (primary) hypertension: Secondary | ICD-10-CM | POA: Insufficient documentation

## 2022-10-06 DIAGNOSIS — Z7984 Long term (current) use of oral hypoglycemic drugs: Secondary | ICD-10-CM | POA: Diagnosis not present

## 2022-10-06 DIAGNOSIS — Z20822 Contact with and (suspected) exposure to covid-19: Secondary | ICD-10-CM | POA: Diagnosis not present

## 2022-10-06 DIAGNOSIS — J150 Pneumonia due to Klebsiella pneumoniae: Secondary | ICD-10-CM | POA: Diagnosis not present

## 2022-10-06 DIAGNOSIS — R079 Chest pain, unspecified: Secondary | ICD-10-CM | POA: Diagnosis not present

## 2022-10-06 DIAGNOSIS — R000 Tachycardia, unspecified: Secondary | ICD-10-CM | POA: Diagnosis not present

## 2022-10-06 DIAGNOSIS — Z79899 Other long term (current) drug therapy: Secondary | ICD-10-CM | POA: Diagnosis not present

## 2022-10-06 DIAGNOSIS — Z1152 Encounter for screening for COVID-19: Secondary | ICD-10-CM | POA: Diagnosis not present

## 2022-10-06 DIAGNOSIS — A419 Sepsis, unspecified organism: Secondary | ICD-10-CM | POA: Diagnosis not present

## 2022-10-06 DIAGNOSIS — I110 Hypertensive heart disease with heart failure: Secondary | ICD-10-CM | POA: Diagnosis not present

## 2022-10-07 DIAGNOSIS — Z7984 Long term (current) use of oral hypoglycemic drugs: Secondary | ICD-10-CM | POA: Diagnosis not present

## 2022-10-07 DIAGNOSIS — I340 Nonrheumatic mitral (valve) insufficiency: Secondary | ICD-10-CM | POA: Diagnosis not present

## 2022-10-07 DIAGNOSIS — I5020 Unspecified systolic (congestive) heart failure: Secondary | ICD-10-CM | POA: Diagnosis not present

## 2022-10-07 DIAGNOSIS — Z79899 Other long term (current) drug therapy: Secondary | ICD-10-CM | POA: Diagnosis not present

## 2022-10-07 DIAGNOSIS — A419 Sepsis, unspecified organism: Secondary | ICD-10-CM | POA: Diagnosis not present

## 2022-10-07 DIAGNOSIS — I110 Hypertensive heart disease with heart failure: Secondary | ICD-10-CM | POA: Diagnosis not present

## 2022-10-07 DIAGNOSIS — R7402 Elevation of levels of lactic acid dehydrogenase (LDH): Secondary | ICD-10-CM | POA: Diagnosis not present

## 2022-10-07 DIAGNOSIS — E785 Hyperlipidemia, unspecified: Secondary | ICD-10-CM | POA: Diagnosis not present

## 2022-10-07 DIAGNOSIS — E119 Type 2 diabetes mellitus without complications: Secondary | ICD-10-CM | POA: Diagnosis not present

## 2022-10-07 DIAGNOSIS — J189 Pneumonia, unspecified organism: Secondary | ICD-10-CM | POA: Diagnosis not present

## 2022-10-07 DIAGNOSIS — I272 Pulmonary hypertension, unspecified: Secondary | ICD-10-CM | POA: Diagnosis not present

## 2022-10-07 DIAGNOSIS — Z794 Long term (current) use of insulin: Secondary | ICD-10-CM | POA: Diagnosis not present

## 2022-10-07 DIAGNOSIS — Z792 Long term (current) use of antibiotics: Secondary | ICD-10-CM | POA: Diagnosis not present

## 2022-10-08 DIAGNOSIS — Z792 Long term (current) use of antibiotics: Secondary | ICD-10-CM | POA: Diagnosis not present

## 2022-10-08 DIAGNOSIS — I110 Hypertensive heart disease with heart failure: Secondary | ICD-10-CM | POA: Diagnosis not present

## 2022-10-08 DIAGNOSIS — I5020 Unspecified systolic (congestive) heart failure: Secondary | ICD-10-CM | POA: Diagnosis not present

## 2022-10-08 DIAGNOSIS — E119 Type 2 diabetes mellitus without complications: Secondary | ICD-10-CM | POA: Diagnosis not present

## 2022-10-08 DIAGNOSIS — Z79899 Other long term (current) drug therapy: Secondary | ICD-10-CM | POA: Diagnosis not present

## 2022-10-08 DIAGNOSIS — Z7984 Long term (current) use of oral hypoglycemic drugs: Secondary | ICD-10-CM | POA: Diagnosis not present

## 2022-10-08 DIAGNOSIS — J189 Pneumonia, unspecified organism: Secondary | ICD-10-CM | POA: Diagnosis not present

## 2022-10-09 DIAGNOSIS — J189 Pneumonia, unspecified organism: Secondary | ICD-10-CM | POA: Diagnosis not present

## 2022-10-09 DIAGNOSIS — E785 Hyperlipidemia, unspecified: Secondary | ICD-10-CM | POA: Diagnosis not present

## 2022-10-09 DIAGNOSIS — I5020 Unspecified systolic (congestive) heart failure: Secondary | ICD-10-CM | POA: Diagnosis not present

## 2022-10-09 DIAGNOSIS — I110 Hypertensive heart disease with heart failure: Secondary | ICD-10-CM | POA: Diagnosis not present

## 2022-10-13 ENCOUNTER — Other Ambulatory Visit: Payer: Self-pay | Admitting: Cardiovascular Disease

## 2022-10-20 ENCOUNTER — Encounter: Payer: Self-pay | Admitting: Family Medicine

## 2022-10-20 ENCOUNTER — Ambulatory Visit: Payer: Medicaid Other | Admitting: Family Medicine

## 2022-10-20 VITALS — BP 113/66 | HR 111 | Temp 97.4°F | Ht 76.5 in | Wt 265.0 lb

## 2022-10-20 DIAGNOSIS — R109 Unspecified abdominal pain: Secondary | ICD-10-CM

## 2022-10-20 DIAGNOSIS — J189 Pneumonia, unspecified organism: Secondary | ICD-10-CM

## 2022-10-20 NOTE — Progress Notes (Signed)
BP 113/66   Pulse (!) 111   Temp (!) 97.4 F (36.3 C)   Ht 6' 4.5" (1.943 m)   Wt 265 lb (120.2 kg)   SpO2 95%   BMI 31.84 kg/m    Subjective:   Patient ID: Evan [name], male    DOB: Nov 25, 1965, 57 y.o.   MRN: [id number]  HPI: Evan [name] is a 57 y.o. male presenting on 10/20/2022 for Hospitalization Follow-up (pneumonia)   HPI Hospital follow-up for pneumonia Patient is coming in today for hospital follow-up for pneumonia.  He was admitted on 10/06/2022 and discharged on 10/09/2022 from [facility].  He was sent home with Augmentin and azithromycin.  He was also diagnosed with systolic heart failure and had an echocardiogram showing EF of 35%.  He had right upper lobe pneumonia that was concerning for aspiration and had IV antibiotics with the ICU.  His BNP was slightly elevated was given some Lasix but it seemed like the majority of his illness was pneumonia.  He says his breathing has been doing good since he got out of the hospital he did take the antibiotic.  The only thing that he has been having is right flank pain, hurts more with deep breathing but sometimes when he walks or balances it hurts as well.  He denies any urinary burning or pain that he knows of but wonders if he might have a kidney stone.  Relevant past medical, surgical, family and social history reviewed and updated as indicated. Interim medical history since our last visit reviewed. Allergies and medications reviewed and updated.  Review of Systems  Constitutional:  Negative for chills and fever.  Eyes:  Negative for visual disturbance.  Respiratory:  Negative for shortness of breath and wheezing.   Cardiovascular:  Negative for chest pain and leg swelling.  Gastrointestinal:  Negative for abdominal distention, abdominal pain, constipation and nausea.  Genitourinary:  Positive for flank pain. Negative for dysuria, hematuria and urgency.  Musculoskeletal:  Negative for back pain and gait problem.  Skin:   Negative for rash.  Neurological:  Negative for dizziness, weakness and light-headedness.  All other systems reviewed and are negative.   Per HPI unless specifically indicated above   Allergies as of 10/20/2022   No Known Allergies      Medication List        Accurate as of October 20, 2022  4:30 PM. If you have any questions, ask your nurse or doctor.          Accu-Chek Aviva device Test BS BID Dx E11.69   Accu-Chek Aviva Plus w/Device Kit 1 each by Does not apply route 4 (four) times daily.   Accu-Chek Guide test strip Generic drug: glucose blood Check BS in the morning and at bedtime Dx E11.8   amoxicillin-clavulanate 875-125 MG tablet Commonly known as: AUGMENTIN Take 1 tablet by mouth 2 (two) times daily.   aspirin EC 81 MG tablet Take 81 mg by mouth daily.   atorvastatin 80 MG tablet Commonly known as: LIPITOR Take 1 tablet (80 mg total) by mouth daily. What changed: when to take this   azithromycin 250 MG tablet Commonly known as: ZITHROMAX Take by mouth.   BD Pen Needle Nano 2nd Gen 32G X 4 MM Misc Generic drug: Insulin Pen Needle Use daily with insulin Dx E11.9, Z79.4   benzonatate 100 MG capsule Commonly known as: TESSALON Take 100 mg by mouth 3 (three) times daily as needed.   carvedilol 12.5  MG tablet Commonly known as: COREG Take 1 tablet (12.5 mg total) by mouth 2 (two) times daily with a meal.   clopidogrel 75 MG tablet Commonly known as: PLAVIX Take 1 tablet (75 mg total) by mouth daily.   empagliflozin 25 MG Tabs tablet Commonly known as: Jardiance Take 1 tablet (25 mg total) by mouth daily.   famotidine 20 MG tablet Commonly known as: Pepcid Take 1 tablet (20 mg total) by mouth 2 (two) times daily. What changed:  when to take this reasons to take this   fenofibrate 145 MG tablet Commonly known as: TRICOR Take 1 tablet (145 mg total) by mouth daily.   Fish Oil 1000 MG Caps Take 2,000 mg by mouth daily.   fluticasone 50  MCG/ACT nasal spray Commonly known as: FLONASE Place 1 spray into both nostrils daily.   furosemide 20 MG tablet Commonly known as: LASIX Take 1 tablet (20 mg total) by mouth daily.   losartan 25 MG tablet Commonly known as: COZAAR Take 1 tablet (25 mg total) by mouth daily. What changed: when to take this   Na Sulfate-K Sulfate-Mg Sulf 17.5-3.13-1.6 GM/177ML Soln See admin instructions.   Semaglutide(0.25 or 0.5MG /DOS) 2 MG/3ML Sopn Inject 0.5 mg into the skin once a week. What changed: when to take this   sildenafil 20 MG tablet Commonly known as: REVATIO Take 1-5 tablets (20-100 mg total) by mouth daily as needed.   spironolactone 25 MG tablet Commonly known as: ALDACTONE TAKE 1/2 TABLET BY MOUTH EVERY DAY   Trulicity 1.5 MG/0.5ML Sopn Generic drug: Dulaglutide Inject 1.5 mg into the skin once a week.         Objective:   BP 113/66   Pulse (!) 111   Temp (!) 97.4 F (36.3 C)   Ht 6' 4.5" (1.943 m)   Wt 265 lb (120.2 kg)   SpO2 95%   BMI 31.84 kg/m   Wt Readings from Last 3 Encounters:  10/20/22 265 lb (120.2 kg)  08/27/22 273 lb (123.8 kg)  08/07/22 275 lb (124.7 kg)    Physical Exam Vitals and nursing note reviewed.  Constitutional:      General: He is not in acute distress.    Appearance: He is well-developed. He is not diaphoretic.  Eyes:     General: No scleral icterus.    Conjunctiva/sclera: Conjunctivae normal.  Neck:     Thyroid: No thyromegaly.  Cardiovascular:     Rate and Rhythm: Normal rate and regular rhythm.     Heart sounds: Normal heart sounds. No murmur heard. Pulmonary:     Effort: Pulmonary effort is normal. No respiratory distress.     Breath sounds: Normal breath sounds. No wheezing.  Abdominal:     General: Abdomen is flat. Bowel sounds are normal. There is no distension.     Palpations: Abdomen is soft.     Tenderness: There is no abdominal tenderness. There is no right CVA tenderness, left CVA tenderness, guarding or  rebound.  Musculoskeletal:        General: No swelling. Normal range of motion.     Cervical back: Neck supple.  Lymphadenopathy:     Cervical: No cervical adenopathy.  Skin:    General: Skin is warm and dry.     Findings: No rash.  Neurological:     Mental Status: He is alert and oriented to person, place, and time.     Coordination: Coordination normal.  Psychiatric:        Behavior:  Behavior normal.     Urinalysis: 3+ glucose but otherwise normal.  Assessment & Plan:   Problem List Items Addressed This Visit   None Visit Diagnoses     Pneumonia of right upper lobe due to infectious organism    -  Primary   Relevant Medications   amoxicillin-clavulanate (AUGMENTIN) 875-125 MG tablet   azithromycin (ZITHROMAX) 250 MG tablet   benzonatate (TESSALON) 100 MG capsule   Other Relevant Orders   CMP14+EGFR   CBC with Differential/Platelet   Flank pain       Relevant Orders   Urinalysis, Complete   Urine Culture       Sounds like he is doing better from the pneumonia, will do a urine because of flank pain.   Follow up plan: Return if symptoms worsen or fail to improve, for Patient already has an appointment on 6/24, we will see him then for follow-up.  Counseling provided for all of the vaccine components Orders Placed This Encounter  Procedures   Urine Culture   CMP14+EGFR   CBC with Differential/Platelet   Urinalysis, Complete    Arville Care, MD [place] 10/20/2022, 4:30 PM

## 2022-10-21 LAB — CBC WITH DIFFERENTIAL/PLATELET
Basophils Absolute: 0.1 10*3/uL (ref 0.0–0.2)
Basos: 1 %
EOS (ABSOLUTE): 0.2 10*3/uL (ref 0.0–0.4)
Eos: 2 %
Hematocrit: 43.3 % (ref 37.5–51.0)
Hemoglobin: 14.5 g/dL (ref 13.0–17.7)
Immature Grans (Abs): 0 10*3/uL (ref 0.0–0.1)
Immature Granulocytes: 0 %
Lymphocytes Absolute: 2.5 10*3/uL (ref 0.7–3.1)
Lymphs: 21 %
MCH: 29.8 pg (ref 26.6–33.0)
MCHC: 33.5 g/dL (ref 31.5–35.7)
MCV: 89 fL (ref 79–97)
Monocytes Absolute: 1.1 10*3/uL — ABNORMAL HIGH (ref 0.1–0.9)
Monocytes: 9 %
Neutrophils Absolute: 8.1 10*3/uL — ABNORMAL HIGH (ref 1.4–7.0)
Neutrophils: 67 %
Platelets: 616 10*3/uL — ABNORMAL HIGH (ref 150–450)
RBC: 4.87 x10E6/uL (ref 4.14–5.80)
RDW: 12.2 % (ref 11.6–15.4)
WBC: 12 10*3/uL — ABNORMAL HIGH (ref 3.4–10.8)

## 2022-10-21 LAB — URINALYSIS, COMPLETE
Bilirubin, UA: NEGATIVE
Ketones, UA: NEGATIVE
Leukocytes,UA: NEGATIVE
Nitrite, UA: NEGATIVE
Protein,UA: NEGATIVE
RBC, UA: NEGATIVE
Specific Gravity, UA: 1.01 (ref 1.005–1.030)
Urobilinogen, Ur: 0.2 mg/dL (ref 0.2–1.0)
pH, UA: 5.5 (ref 5.0–7.5)

## 2022-10-21 LAB — CMP14+EGFR
ALT: 17 IU/L (ref 0–44)
AST: 13 IU/L (ref 0–40)
Albumin/Globulin Ratio: 1.1 — ABNORMAL LOW (ref 1.2–2.2)
Albumin: 4.1 g/dL (ref 3.8–4.9)
Alkaline Phosphatase: 90 IU/L (ref 44–121)
BUN/Creatinine Ratio: 15 (ref 9–20)
BUN: 15 mg/dL (ref 6–24)
Bilirubin Total: 0.4 mg/dL (ref 0.0–1.2)
CO2: 22 mmol/L (ref 20–29)
Calcium: 9.5 mg/dL (ref 8.7–10.2)
Chloride: 103 mmol/L (ref 96–106)
Creatinine, Ser: 1 mg/dL (ref 0.76–1.27)
Globulin, Total: 3.9 g/dL (ref 1.5–4.5)
Glucose: 130 mg/dL — ABNORMAL HIGH (ref 70–99)
Potassium: 4.8 mmol/L (ref 3.5–5.2)
Sodium: 140 mmol/L (ref 134–144)
Total Protein: 8 g/dL (ref 6.0–8.5)
eGFR: 88 mL/min/{1.73_m2} (ref >59)

## 2022-10-23 ENCOUNTER — Ambulatory Visit (HOSPITAL_BASED_OUTPATIENT_CLINIC_OR_DEPARTMENT_OTHER): Payer: Medicaid Other | Admitting: Family

## 2022-10-23 LAB — URINE CULTURE

## 2022-11-03 ENCOUNTER — Ambulatory Visit (HOSPITAL_BASED_OUTPATIENT_CLINIC_OR_DEPARTMENT_OTHER): Payer: Medicaid Other | Admitting: Family

## 2022-11-10 ENCOUNTER — Encounter: Payer: Self-pay | Admitting: Family Medicine

## 2022-11-10 ENCOUNTER — Ambulatory Visit: Payer: Medicaid Other | Admitting: Family Medicine

## 2022-11-10 VITALS — BP 102/62 | HR 87 | Ht 76.5 in | Wt 262.0 lb

## 2022-11-10 DIAGNOSIS — E118 Type 2 diabetes mellitus with unspecified complications: Secondary | ICD-10-CM | POA: Diagnosis not present

## 2022-11-10 DIAGNOSIS — E11628 Type 2 diabetes mellitus with other skin complications: Secondary | ICD-10-CM | POA: Diagnosis not present

## 2022-11-10 DIAGNOSIS — L84 Corns and callosities: Secondary | ICD-10-CM

## 2022-11-10 DIAGNOSIS — I152 Hypertension secondary to endocrine disorders: Secondary | ICD-10-CM | POA: Diagnosis not present

## 2022-11-10 DIAGNOSIS — E1169 Type 2 diabetes mellitus with other specified complication: Secondary | ICD-10-CM | POA: Diagnosis not present

## 2022-11-10 DIAGNOSIS — E1159 Type 2 diabetes mellitus with other circulatory complications: Secondary | ICD-10-CM | POA: Diagnosis not present

## 2022-11-10 DIAGNOSIS — E785 Hyperlipidemia, unspecified: Secondary | ICD-10-CM | POA: Diagnosis not present

## 2022-11-10 DIAGNOSIS — D72825 Bandemia: Secondary | ICD-10-CM

## 2022-11-10 LAB — BAYER DCA HB A1C WAIVED: HB A1C (BAYER DCA - WAIVED): 7.6 % — ABNORMAL HIGH (ref 4.8–5.6)

## 2022-11-10 NOTE — Progress Notes (Signed)
BP 102/62   Pulse 87   Ht 6' 4.5" (1.943 m)   Wt 262 lb (118.8 kg)   SpO2 97%   BMI 31.48 kg/m    Subjective:   Patient ID: Evan [name], male    DOB: 1966/03/16, 57 y.o.   MRN: [id number]  HPI: Evan [name] is a 57 y.o. male presenting on 11/10/2022 for Medical Management of Chronic Issues and Diabetes   HPI Type 2 diabetes mellitus Patient comes in today for recheck of his diabetes. Patient has been currently taking Jardiance and Trulicity. Patient is currently on an ACE inhibitor/ARB. Patient has seen an ophthalmologist this year. Patient denies any new issues with their feet, he says he did have a blister a few weeks ago by him.  Antibiotic cream and peroxide on it and it is healed up now.. The symptom started onset as an adult hypertension and hyperlipidemia and CAD ARE RELATED TO DM   Hypertension Patient is currently on furosemide and losartan spironolactone, and their blood pressure today is 102/62. Patient denies any lightheadedness or dizziness. Patient denies headaches, blurred vision, chest pains, shortness of breath, or weakness. Denies any side effects from medication and is content with current medication.   Hyperlipidemia Patient is coming in for recheck of his hyperlipidemia. The patient is currently taking atorvastatin and fenofibrate and fish oil. They deny any issues with myalgias or history of liver damage from it. They deny any focal numbness or weakness or chest pain.   Relevant past medical, surgical, family and social history reviewed and updated as indicated. Interim medical history since our last visit reviewed. Allergies and medications reviewed and updated.  Review of Systems  Constitutional:  Negative for chills and fever.  Eyes:  Negative for visual disturbance.  Respiratory:  Negative for shortness of breath and wheezing.   Cardiovascular:  Negative for chest pain and leg swelling.  Musculoskeletal:  Negative for back pain and gait problem.   Skin:  Negative for rash.  Neurological:  Negative for dizziness and light-headedness.  All other systems reviewed and are negative.   Per HPI unless specifically indicated above   Allergies as of 11/10/2022   No Known Allergies      Medication List        Accurate as of November 10, 2022  1:19 PM. If you have any questions, ask your nurse or doctor.          STOP taking these medications    amoxicillin-clavulanate 875-125 MG tablet Commonly known as: AUGMENTIN Stopped by: Nils Pyle, MD   azithromycin 250 MG tablet Commonly known as: ZITHROMAX Stopped by: Elige Radon Lorriane Dehart, MD   benzonatate 100 MG capsule Commonly known as: TESSALON Stopped by: Nils Pyle, MD   carvedilol 12.5 MG tablet Commonly known as: COREG Stopped by: Nils Pyle, MD   Na Sulfate-K Sulfate-Mg Sulf 17.5-3.13-1.6 GM/177ML Soln Stopped by: Nils Pyle, MD   Semaglutide(0.25 or 0.5MG /DOS) 2 MG/3ML Sopn Stopped by: Elige Radon Teyana Pierron, MD       TAKE these medications    Accu-Chek Aviva device Test BS BID Dx E11.69   Accu-Chek Aviva Plus w/Device Kit 1 each by Does not apply route 4 (four) times daily.   Accu-Chek Guide test strip Generic drug: glucose blood Check BS in the morning and at bedtime Dx E11.8   aspirin EC 81 MG tablet Take 81 mg by mouth daily.   atorvastatin 80 MG tablet Commonly known as: LIPITOR Take 1  tablet (80 mg total) by mouth daily. What changed: when to take this   BD Pen Needle Nano 2nd Gen 32G X 4 MM Misc Generic drug: Insulin Pen Needle Use daily with insulin Dx E11.9, Z79.4   clopidogrel 75 MG tablet Commonly known as: PLAVIX Take 1 tablet (75 mg total) by mouth daily.   empagliflozin 25 MG Tabs tablet Commonly known as: Jardiance Take 1 tablet (25 mg total) by mouth daily.   famotidine 20 MG tablet Commonly known as: Pepcid Take 1 tablet (20 mg total) by mouth 2 (two) times daily. What changed:  when to take  this reasons to take this   fenofibrate 145 MG tablet Commonly known as: TRICOR Take 1 tablet (145 mg total) by mouth daily.   Fish Oil 1000 MG Caps Take 2,000 mg by mouth daily.   fluticasone 50 MCG/ACT nasal spray Commonly known as: FLONASE Place 1 spray into both nostrils daily.   furosemide 20 MG tablet Commonly known as: LASIX Take 1 tablet (20 mg total) by mouth daily.   losartan 25 MG tablet Commonly known as: COZAAR Take 1 tablet (25 mg total) by mouth daily. What changed: when to take this   sildenafil 20 MG tablet Commonly known as: REVATIO Take 1-5 tablets (20-100 mg total) by mouth daily as needed.   spironolactone 25 MG tablet Commonly known as: ALDACTONE TAKE 1/2 TABLET BY MOUTH EVERY DAY   Trulicity 1.5 MG/0.5ML Sopn Generic drug: Dulaglutide Inject 1.5 mg into the skin once a week.         Objective:   BP 102/62   Pulse 87   Ht 6' 4.5" (1.943 m)   Wt 262 lb (118.8 kg)   SpO2 97%   BMI 31.48 kg/m   Wt Readings from Last 3 Encounters:  11/10/22 262 lb (118.8 kg)  10/20/22 265 lb (120.2 kg)  08/27/22 273 lb (123.8 kg)    Physical Exam Vitals and nursing note reviewed.  Constitutional:      General: He is not in acute distress.    Appearance: He is well-developed. He is not diaphoretic.  Eyes:     General: No scleral icterus.    Conjunctiva/sclera: Conjunctivae normal.  Neck:     Thyroid: No thyromegaly.  Cardiovascular:     Rate and Rhythm: Normal rate and regular rhythm.     Heart sounds: Normal heart sounds. No murmur heard. Pulmonary:     Effort: Pulmonary effort is normal. No respiratory distress.     Breath sounds: Normal breath sounds. No wheezing.  Musculoskeletal:        General: No swelling. Normal range of motion.     Cervical back: Neck supple.  Lymphadenopathy:     Cervical: No cervical adenopathy.  Skin:    General: Skin is warm and dry.     Findings: No rash.  Neurological:     Mental Status: He is alert and  oriented to person, place, and time.     Coordination: Coordination normal.  Psychiatric:        Behavior: Behavior normal.       Assessment & Plan:   Problem List Items Addressed This Visit       Cardiovascular and Mediastinum   Hypertension associated with diabetes (HCC)     Endocrine   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) - Primary   Relevant Orders   Bayer DCA Hb A1c Waived   Hyperlipidemia associated with type 2 diabetes mellitus (HCC)  Type 2 diabetes mellitus with complications (HCC)   Relevant Orders   Bayer DCA Hb A1c Waived   Other Visit Diagnoses     Bandemia       Relevant Orders   CBC with Differential/Platelet       Continue current medicine, will check blood work on way out today.  Leukocytosis after the pneumonia, will recheck that today. Follow up plan: Return in about 3 months (around 02/10/2023), or if symptoms worsen or fail to improve, for Diabetes hypertension and cholesterol.  Counseling provided for all of the vaccine components Orders Placed This Encounter  Procedures   Bayer DCA Hb A1c Waived   CBC with Differential/Platelet    Arville Care, MD [facility] 11/10/2022, 1:19 PM

## 2022-11-11 LAB — CBC WITH DIFFERENTIAL/PLATELET
Basophils Absolute: 0.1 10*3/uL (ref 0.0–0.2)
Basos: 1 %
EOS (ABSOLUTE): 0.3 10*3/uL (ref 0.0–0.4)
Eos: 3 %
Hematocrit: 47.5 % (ref 37.5–51.0)
Hemoglobin: 15.8 g/dL (ref 13.0–17.7)
Immature Grans (Abs): 0 10*3/uL (ref 0.0–0.1)
Immature Granulocytes: 0 %
Lymphocytes Absolute: 2.1 10*3/uL (ref 0.7–3.1)
Lymphs: 24 %
MCH: 29.9 pg (ref 26.6–33.0)
MCHC: 33.3 g/dL (ref 31.5–35.7)
MCV: 90 fL (ref 79–97)
Monocytes Absolute: 0.4 10*3/uL (ref 0.1–0.9)
Monocytes: 4 %
Neutrophils Absolute: 5.8 10*3/uL (ref 1.4–7.0)
Neutrophils: 68 %
Platelets: 252 10*3/uL (ref 150–450)
RBC: 5.28 x10E6/uL (ref 4.14–5.80)
RDW: 13 % (ref 11.6–15.4)
WBC: 8.6 10*3/uL (ref 3.4–10.8)

## 2022-11-13 ENCOUNTER — Other Ambulatory Visit: Payer: Self-pay | Admitting: Cardiovascular Disease

## 2022-11-13 DIAGNOSIS — I152 Hypertension secondary to endocrine disorders: Secondary | ICD-10-CM

## 2022-11-19 ENCOUNTER — Ambulatory Visit (HOSPITAL_BASED_OUTPATIENT_CLINIC_OR_DEPARTMENT_OTHER): Payer: Medicaid Other | Admitting: Family

## 2022-11-27 ENCOUNTER — Other Ambulatory Visit: Payer: Self-pay | Admitting: Cardiovascular Disease

## 2022-11-27 DIAGNOSIS — I152 Hypertension secondary to endocrine disorders: Secondary | ICD-10-CM

## 2022-11-27 DIAGNOSIS — E1159 Type 2 diabetes mellitus with other circulatory complications: Secondary | ICD-10-CM

## 2022-12-26 DIAGNOSIS — H5213 Myopia, bilateral: Secondary | ICD-10-CM | POA: Diagnosis not present

## 2023-01-08 ENCOUNTER — Other Ambulatory Visit: Payer: Self-pay | Admitting: Cardiovascular Disease

## 2023-01-12 ENCOUNTER — Ambulatory Visit (INDEPENDENT_AMBULATORY_CARE_PROVIDER_SITE_OTHER): Payer: Medicaid Other | Admitting: Family

## 2023-01-12 ENCOUNTER — Encounter (HOSPITAL_BASED_OUTPATIENT_CLINIC_OR_DEPARTMENT_OTHER): Payer: Self-pay | Admitting: Family

## 2023-01-12 VITALS — BP 120/70 | HR 101 | Ht 76.5 in | Wt 269.0 lb

## 2023-01-12 DIAGNOSIS — I255 Ischemic cardiomyopathy: Secondary | ICD-10-CM

## 2023-01-12 DIAGNOSIS — E785 Hyperlipidemia, unspecified: Secondary | ICD-10-CM

## 2023-01-12 DIAGNOSIS — I252 Old myocardial infarction: Secondary | ICD-10-CM | POA: Diagnosis not present

## 2023-01-12 DIAGNOSIS — I152 Hypertension secondary to endocrine disorders: Secondary | ICD-10-CM | POA: Diagnosis not present

## 2023-01-12 DIAGNOSIS — E1159 Type 2 diabetes mellitus with other circulatory complications: Secondary | ICD-10-CM

## 2023-01-12 DIAGNOSIS — I25118 Atherosclerotic heart disease of native coronary artery with other forms of angina pectoris: Secondary | ICD-10-CM

## 2023-01-12 MED ORDER — CLOPIDOGREL BISULFATE 75 MG PO TABS
75.0000 mg | ORAL_TABLET | Freq: Every day | ORAL | 3 refills | Status: DC
Start: 2023-01-12 — End: 2023-04-19

## 2023-01-12 MED ORDER — METOPROLOL SUCCINATE ER 25 MG PO TB24: 25.0000 mg | ORAL_TABLET | Freq: Every day | ORAL | 3 refills | Status: DC

## 2023-01-12 MED ORDER — SPIRONOLACTONE 25 MG PO TABS
12.5000 mg | ORAL_TABLET | Freq: Every day | ORAL | 3 refills | Status: DC
Start: 2023-01-12 — End: 2023-04-21

## 2023-01-12 NOTE — Patient Instructions (Addendum)
Medication Instructions:  Your physician has recommended you make the following change in your medication:   Remain off Carvedilol.   START Metoprolol Succinate one 25 mg tablet daily This is a medication that helps to reduce workload on your heart muscle. It also helps to lower your heart rate and prevent it from racing. This will not lower your BP as much as the Carvedilol - we are using this new medication so we don't lower your blood pressure too far to where you feel lightheaded or dizzy  *If you need a refill on your cardiac medications before your next appointment, please call your pharmacy*  Testing/Procedures: Your EKG today shows sinus tachycardia which is a regular but fast heart rhythm.  Follow-Up: At [facility], you and your health needs are our priority.  As part of our continuing mission to provide you with exceptional heart care, we have created designated Provider Care Teams.  These Care Teams include your primary Cardiologist (physician) and Advanced Practice Providers (APPs -  Physician Assistants and Nurse Practitioners) who all work together to provide you with the care you need, when you need it.  We recommend signing up for the patient portal called "MyChart".  Sign up information is provided on this After Visit Summary.  MyChart is used to connect with patients for Virtual Visits (Telemedicine).  Patients are able to view lab/test results, encounter notes, upcoming appointments, etc.  Non-urgent messages can be sent to your provider as well.   To learn more about what you can do with MyChart, go to [email or web address].    Your next appointment:   6 month(s)  Provider:   Nanetta Batty, MD    Other Instructions  Put a warm washcloth on your left chest for the next couple of days. If the swelling and redness does not improve, reach out to Dr. Louanne Skye.  Heart Healthy Diet Recommendations: A low-salt diet is recommended. Meats should be grilled,  baked, or boiled. Avoid fried foods. Focus on lean protein sources like fish or chicken with vegetables and fruits. The American Heart Association is a Chief [employer] Officer!  American Heart Association Diet and Lifeystyle Recommendations   Exercise recommendations: The American Heart Association recommends 150 minutes of moderate intensity exercise weekly. Try 30 minutes of moderate intensity exercise 4-5 times per week. This could include walking, jogging, or swimming.

## 2023-01-12 NOTE — Progress Notes (Signed)
Cardiology Office Note:  .   Date:  01/12/2023  ID:  KOBY EVOY, DOB [birth year], MRN [id number] PCP: Dettinger, Elige Radon, MD   [facility] Providers Cardiologist:  Nanetta Batty, MD    History of Present Illness: .   MACHI VANPATTEN is a 57 y.o. male hypertension, hyperlipidemia, DM2, CAD s/p anterolateral MI 11/2018 with intervention to proximal LAD and staged RCA intervention several days later, ischemic cardiomyopathy (LVEF 20 to 25% at time of MI with repeat LVEF 35-40%  LVEF plateaued at 35-40% but echo 08/23/2020.  Admissions 5/20 - 10/09/2022 to [facility] with RUL pneumonia treated with IV antibiotics and cough medication.  Echo 10/07/22 LVEF 35%, gr1Dd, mild MR, LA mildly dialted, mild PAH, mild to moderately dilated LV. Carvedilol dose reduced from 25 to 12.5 mg BID.   Presents today for follow up independently. Notes boil on his left chest wall that popped up over the last couple of days. Thinks he might have pinched it while working under the floor in his daughter's home. Reports no shortness of breath nor dyspnea on exertion. Reports no chest pain, pressure, or tightness. No edema, orthopnea, PND. Reports no palpitations.    ROS: Please see the history of present illness.    All other systems reviewed and are negative.   Studies Reviewed: .        Cardiac Studies & Procedures   CARDIAC CATHETERIZATION  CARDIAC CATHETERIZATION 11/26/2018  Narrative  Previously placed Ost LAD to Prox LAD drug eluting stent is widely patent.  Balloon angioplasty was performed.  Previously placed Mid LAD-2 drug eluting stent is widely patent.  Balloon angioplasty was performed.  Mid LAD-1 lesion is 30% stenosed.  Dist Cx lesion is 99% stenosed.  Prox RCA to Mid RCA lesion is 95% stenosed.  Dist RCA lesion is 30% stenosed.  RPAV lesion is 100% stenosed.  Post intervention, there is a 0% residual stenosis.  A drug-eluting stent was successfully placed using a STENT  RESOLUTE ONYX 2.75X18.  1.  Widely patent LAD stents with unchanged coronary anatomy. 2.  LVEDP was 13 mmHg with mild hypotension.  The patient was given 250 normal saline bolus. 3.  Successful angioplasty and drug-eluting stent placement to the proximal/mid right coronary artery.  Recommendations: Continue dual antiplatelet therapy. I decreased furosemide to 40 mg once daily.  Continue other heart failure medications.  Given intermittent hypotension.  I do not recommend starting Entresto at the present time.  I elected to start small dose losartan.  If this is well-tolerated, he can be switched as an outpatient to [facility].  I agree with the LifeVest.  Findings Coronary Findings Diagnostic  Dominance: Right  Left Anterior Descending Collaterals Dist LAD filled by collaterals from RPDA.  Previously placed Ost LAD to Prox LAD drug eluting stent is widely patent. Vessel is the culprit lesion. The lesion is type C and thrombotic with left-to-right collateral flow. Mid LAD-1 lesion is 30% stenosed. The lesion is moderately calcified. Previously placed Mid LAD-2 drug eluting stent is widely patent. The lesion is not complex (non high-C) and distal to major branch.  Left Circumflex Dist Cx lesion is 99% stenosed.  First Obtuse Marginal Branch The vessel exhibits minimal luminal irregularities.  Second Obtuse Marginal Branch The vessel exhibits minimal luminal irregularities.  Right Coronary Artery Prox RCA to Mid RCA lesion is 95% stenosed. The lesion is not complex (non high-C). The lesion was not previously treated. The stenosis was measured by a visual reading. Pressure wire/FFR  was not performed on the lesion. IVUS was not performed. Dist RCA lesion is 30% stenosed.  Right Posterior Atrioventricular Artery RPAV lesion is 100% stenosed.  Intervention  Prox RCA to Mid RCA lesion Stent Lesion length:  16 mm. CATHETER LAUNCHER 6FR JR4 guide catheter was inserted. Lesion crossed  with guidewire using a WIRE RUNTHROUGH .[id number]. Pre-stent angioplasty was performed using a BALLOON SAPPHIRE 2.5X12. Maximum pressure:  10 atm. Inflation time:  30 sec. A drug-eluting stent was successfully placed using a STENT RESOLUTE ONYX [id number]. Maximum pressure: 14 atm. Inflation time: 20 sec. Post-stent angioplasty was performed using a BALLOON SAPPHIRE [place] 3.0X10. Maximum pressure:  14 atm. Inflation time:  20 sec. Post-Intervention Lesion Assessment The intervention was successful. Pre-interventional TIMI flow is 3. Post-intervention TIMI flow is 3. No complications occurred at this lesion. There is a 0% residual stenosis post intervention.   CARDIAC CATHETERIZATION 11/24/2018  Narrative  Prox RCA to Mid RCA lesion is 95% stenosed.  Dist RCA lesion is 30% stenosed.  RPAV lesion is 100% stenosed.  Dist Cx lesion is 99% stenosed.  There is severe left ventricular systolic dysfunction.  LV end diastolic pressure is severely elevated.  The left ventricular ejection fraction is less than 25% by visual estimate.  Ost LAD to Prox LAD lesion is 100% stenosed.  Post intervention, there is a 0% residual stenosis.  A drug-eluting stent was successfully placed using a STENT RESOLUTE ONYX 3.5X22.  A drug-eluting stent was successfully placed using a STENT RESOLUTE ONYX [id number].  Mid LAD-1 lesion is 30% stenosed.  Mid LAD-2 lesion is 100% stenosed.  Post intervention, there is a 0% residual stenosis.  1.  Severe three-vessel coronary artery disease.  Occluded ostial and mid LAD likely due to late presenting anterior STEMI.  Significant distal left circumflex disease but the supplied territory is small.  Significant mid RCA stenosis with occluded posterior AV groove with left-to-right collaterals. 2.  Severely reduced LV systolic function with an EF of 20 to 25% with anterior wall akinesis.  LVEDP was 32 mmHg. 3.  Successful PCI and drug-eluting stent placement to the ostial as well  as mid LAD.  Recommendations: The patient was given furosemide 40 mg IV after the case.  Continue furosemide 40 mg by mouth twice daily starting tomorrow. I started small dose carvedilol. If renal function remains stable, recommend adding a small dose ARB with plans to transition to [facility] as an outpatient.  In addition, should consider spironolactone if blood pressure allows. Recommend dual antiplatelet therapy for at least 1 year. Recommend staged mid RCA PCI likely on Friday if the patient remains stable.  The rest of his coronary artery disease should be treated medically.  Findings Coronary Findings Diagnostic  Dominance: Right  Left Anterior Descending Collaterals Dist LAD filled by collaterals from RPDA.  Ost LAD to Prox LAD lesion is 100% stenosed. Vessel is the culprit lesion. The lesion is type C and thrombotic with left-to-right collateral flow. The lesion was not previously treated. Mid LAD-1 lesion is 30% stenosed. The lesion is moderately calcified. Mid LAD-2 lesion is 100% stenosed. The lesion is not complex (non high-C) and distal to major branch. The lesion was not previously treated.  Left Circumflex Dist Cx lesion is 99% stenosed.  First Obtuse Marginal Branch The vessel exhibits minimal luminal irregularities.  Second Obtuse Marginal Branch The vessel exhibits minimal luminal irregularities.  Right Coronary Artery Prox RCA to Mid RCA lesion is 95% stenosed. Dist RCA lesion is 30% stenosed.  Right Posterior Atrioventricular Artery RPAV lesion is 100% stenosed.  Intervention  Ost LAD to Prox LAD lesion Stent Lesion length:  18 mm. CATHETER LAUNCHER 6FREBU 3.5 guide catheter was inserted. Lesion crossed with guidewire using a WIRE RUNTHROUGH .[id number]. Pre-stent angioplasty was performed using a BALLOON SAPPHIRE 2.5X15. Maximum pressure:  10 atm. Inflation time:  30 sec. A drug-eluting stent was successfully placed using a STENT RESOLUTE ONYX 3.5X22.  Maximum pressure: 12 atm. Inflation time: 15 sec. Post-stent angioplasty was performed using a BALLOON SAPPHIRE [place] [id number]. Maximum pressure:  12 atm. Inflation time:  15 sec. Post-Intervention Lesion Assessment The intervention was successful. Pre-interventional TIMI flow is 0. Post-intervention TIMI flow is 3. No complications occurred at this lesion. There is a 0% residual stenosis post intervention.  Mid LAD-2 lesion Stent Lesion length:  16 mm. CATHETER LAUNCHER 6FREBU 3.5 guide catheter was inserted. Lesion crossed with guidewire using a WIRE RUNTHROUGH .[id number]. Pre-stent angioplasty was performed using a BALLOON SAPPHIRE 2.5X15. Maximum pressure:  8 atm. Inflation time:  30 sec. A drug-eluting stent was successfully placed using a STENT RESOLUTE ONYX [id number]. Maximum pressure: 12 atm. Inflation time: 20 sec. Stent does not overlap previously placed stentPost-stent angioplasty was performed using a BALLOON SAPPHIRE [place] 3.0X12. Maximum pressure:  12 atm. Inflation time:  20 sec. Post-Intervention Lesion Assessment The intervention was successful. Pre-interventional TIMI flow is 0. Post-intervention TIMI flow is 3. No complications occurred at this lesion. There is a 0% residual stenosis post intervention.     ECHOCARDIOGRAM  ECHOCARDIOGRAM COMPLETE 08/23/2020  Narrative ECHOCARDIOGRAM REPORT    Patient Name:   GRAYLON AMIE Date of Exam: 08/23/2020 Medical Rec #:  [id number]      Height:       76.0 in Accession #:    [id number]     Weight:       288.8 lb Date of Birth:  1965-07-03      BSA:          2.590 m Patient Age:    54 years       BP:           133/89 mmHg Patient Gender: M              HR:           75 bpm. Exam Location:  [address]  Procedure: 2D Echo, Cardiac Doppler, Color Doppler and Intracardiac Opacification Agent  Indications:    I25.5 Ischemic cardiomyopathy  History:        Patient has prior history of Echocardiogram examinations, most recent 02/23/2019.  Risk Factors:Hypertension, Dyslipidemia, Diabetes and Obesity.  Sonographer:    Daphine Deutscher RDCS Referring Phys: [id number] JESSE M CLEAVER  IMPRESSIONS   1. Hypokinesis of the septum, apical anterior and apical myocardium. Left ventricular ejection fraction, by estimation, is 35 to 40%. The left ventricle has moderately decreased function. The left ventricle demonstrates regional wall motion abnormalities (see scoring diagram/findings for description). There is mild concentric left ventricular hypertrophy. Left ventricular diastolic parameters are consistent with Grade II diastolic dysfunction (pseudonormalization). 2. Right ventricular systolic function is normal. The right ventricular size is normal. 3. Left atrial size was mildly dilated. 4. The mitral valve is normal in structure. Trivial mitral valve regurgitation. No evidence of mitral stenosis. 5. The aortic valve is tricuspid. Aortic valve regurgitation is not visualized. No aortic stenosis is present. 6. The inferior vena cava is dilated in size with >50% respiratory variability, suggesting right atrial pressure of 8 mmHg.  FINDINGS Left Ventricle: Hypokinesis of the septum, apical anterior and apical myocardium. Left ventricular ejection fraction, by estimation, is 35 to 40%. The left ventricle has moderately decreased function. The left ventricle demonstrates regional wall motion abnormalities. Definity contrast agent was given IV to delineate the left ventricular endocardial borders. The left ventricular internal cavity size was normal in size. There is mild concentric left ventricular hypertrophy. Left ventricular diastolic parameters are consistent with Grade II diastolic dysfunction (pseudonormalization). Indeterminate filling pressures.  Right Ventricle: The right ventricular size is normal. No increase in right ventricular wall thickness. Right ventricular systolic function is normal.  Left Atrium: Left atrial size  was mildly dilated.  Right Atrium: Right atrial size was normal in size.  Pericardium: There is no evidence of pericardial effusion.  Mitral Valve: The mitral valve is normal in structure. Trivial mitral valve regurgitation. No evidence of mitral valve stenosis.  Tricuspid Valve: The tricuspid valve is normal in structure. Tricuspid valve regurgitation is not demonstrated. No evidence of tricuspid stenosis.  Aortic Valve: The aortic valve is tricuspid. Aortic valve regurgitation is not visualized. No aortic stenosis is present.  Pulmonic Valve: The pulmonic valve was normal in structure. Pulmonic valve regurgitation is not visualized. No evidence of pulmonic stenosis.  Aorta: The aortic root is normal in size and structure.  Venous: The inferior vena cava is dilated in size with greater than 50% respiratory variability, suggesting right atrial pressure of 8 mmHg.  IAS/Shunts: No atrial level shunt detected by color flow Doppler.   LEFT VENTRICLE PLAX 2D LVIDd:         5.00 cm  Diastology LVIDs:         4.10 cm  LV e' medial:    7.07 cm/s LV PW:         1.10 cm  LV E/e' medial:  12.2 LV IVS:        1.10 cm  LV e' lateral:   6.74 cm/s LVOT diam:     2.70 cm  LV E/e' lateral: 12.8 LV SV:         115 LV SV Index:   44 LVOT Area:     5.73 cm   RIGHT VENTRICLE             IVC RV Basal diam:  4.10 cm     IVC diam: 2.10 cm RV S prime:     20.90 cm/s TAPSE (M-mode): 3.3 cm  LEFT ATRIUM             Index       RIGHT ATRIUM           Index LA diam:        5.40 cm 2.09 cm/m  RA Area:     11.90 cm LA Vol (A2C):   81.9 ml 31.63 ml/m RA Volume:   28.70 ml  11.08 ml/m LA Vol (A4C):   71.4 ml 27.57 ml/m LA Biplane Vol: 78.5 ml 30.31 ml/m AORTIC VALVE LVOT Vmax:   110.00 cm/s LVOT Vmean:  74.100 cm/s LVOT VTI:    0.200 m  AORTA Ao Root diam: 3.90 cm  MITRAL VALVE MV Area (PHT): 3.91 cm    SHUNTS MV Decel Time: 194 msec    Systemic VTI:  0.20 m MV E velocity: 86.10 cm/s   Systemic Diam: 2.70 cm MV A velocity: 78.00 cm/s MV E/A ratio:  1.10  Chilton Si MD Electronically signed by Chilton Si MD Signature Date/Time: 08/23/2020/4:21:39 PM    Final  Risk Assessment/Calculations:             Physical Exam:   VS:  BP 120/70   Pulse (!) 101   Ht 6' 4.5" (1.943 m)   Wt 269 lb (122 kg)   BMI 32.32 kg/m    Wt Readings from Last 3 Encounters:  01/12/23 269 lb (122 kg)  11/10/22 262 lb (118.8 kg)  10/20/22 265 lb (120.2 kg)    GEN: Well nourished, well developed in no acute distress NECK: No JVD; No carotid bruits CARDIAC: RRR, no murmurs, rubs, gallops RESPIRATORY:  Clear to auscultation without rales, wheezing or rhonchi  ABDOMEN: Soft, non-tender, non-distended EXTREMITIES:  No edema; No deformity   ASSESSMENT AND PLAN: .    L chest wall tenderness - L chest wall with 0.5" in diameter hard nodule under the nipple. Notes he may have pinched it while working on his daughter's floors. Recommend  warm compress. Low suspicion Spironolactone causing gynecomastia as he has been taking for some time. However, if tenderness does not improve consider transition to Eplerenone or [place].   HTN- BP well controlled. Continue Losartan 25mg  daily, Spironolactone 12.60m daily. Add Toprol 25mg  daily for GDMT of HF. Discussed to monitor BP at home at least 2 hours after medications and sitting for 5-10 minutes.   Sinus tachycardia - Noted by EKG today. Carvedilol discontinued during recent admission at [facility], unclear why. Hesitant to resume due to recent hypotension. Will instead Rx Toprol 25mg  daily.   HLD, LDL goal 70- Continue Atorvastatin. Denies myalgias.   DM2- Continue to follow with PCP.   Ischemic cardiomyopathy- Euvolemic and well compensated on exam. GDMT includes Jardiance, Lasix, Losartan, Spironolactone. Add Toprol, as above. Could consider Entresto in the future. Low sodium diet, fluid restriction <2L, and daily weights  encouraged. Educated to contact our office for weight gain of 2 lbs overnight or 5 lbs in one week.   CAD - Stable with no anginal symptoms. No indication for ischemic evaluation.  GDMT Plavix, Aspirin, Atorvastatin. Add Toprol, as above. Recommend aiming for 150 minutes of moderate intensity activity per week and following a heart healthy diet.         Dispo: follow up in 6 months  Signed, Alver Sorrow, NP

## 2023-01-20 ENCOUNTER — Telehealth: Payer: Self-pay | Admitting: Family Medicine

## 2023-01-20 DIAGNOSIS — E11628 Type 2 diabetes mellitus with other skin complications: Secondary | ICD-10-CM

## 2023-01-20 MED ORDER — TRULICITY 1.5 MG/0.5ML ~~LOC~~ SOAJ: 1.5000 mg | SUBCUTANEOUS | 0 refills | Status: DC

## 2023-01-20 NOTE — Telephone Encounter (Signed)
  Prescription Request  01/20/2023    What is the name of the medication or equipment? TRULICITY  Have you contacted your pharmacy to request a refill? YES  Which pharmacy would you like this sent to? [facility]   Patient notified that their request is being sent to the clinical staff for review and that they should receive a response within 2 business days.

## 2023-01-20 NOTE — Telephone Encounter (Signed)
Refill sent.

## 2023-01-22 MED ORDER — SEMAGLUTIDE(0.25 OR 0.5MG/DOS) 2 MG/3ML ~~LOC~~ SOPN
0.5000 mg | PEN_INJECTOR | SUBCUTANEOUS | 5 refills | Status: DC
Start: 2023-01-22 — End: 2023-05-04

## 2023-01-22 NOTE — Telephone Encounter (Signed)
Attempted to call pt - service not working

## 2023-01-22 NOTE — Telephone Encounter (Signed)
Pt says that pharmacy is out Trulicity and wants to know if ozempic can be called in instead. Use [facility]

## 2023-01-22 NOTE — Telephone Encounter (Signed)
Sent Ozempic for him, it should be close to equivalent dose

## 2023-01-22 NOTE — Addendum Note (Signed)
Addended by: Arville Care on: 01/22/2023 10:48 AM   Modules accepted: Orders

## 2023-01-22 NOTE — Telephone Encounter (Signed)
Please advise 

## 2023-01-27 NOTE — Telephone Encounter (Signed)
Pt made aware that Ozempic has been sent for him.  Pt states that he also needs his Jardiance. He states that he keeps getting a message that it requires doctors approval to fill.  Dettinger sent in a year supply of Jardiance in March this year. Instructed pt to contact his pharmacy and make them aware. Will send new script if needed.

## 2023-02-11 ENCOUNTER — Ambulatory Visit: Payer: Medicaid Other | Admitting: Family Medicine

## 2023-02-11 ENCOUNTER — Encounter: Payer: Self-pay | Admitting: Family Medicine

## 2023-02-11 VITALS — BP 106/71 | HR 82 | Ht 76.5 in | Wt 263.0 lb

## 2023-02-11 DIAGNOSIS — L84 Corns and callosities: Secondary | ICD-10-CM | POA: Diagnosis not present

## 2023-02-11 DIAGNOSIS — Z7984 Long term (current) use of oral hypoglycemic drugs: Secondary | ICD-10-CM | POA: Diagnosis not present

## 2023-02-11 DIAGNOSIS — E1169 Type 2 diabetes mellitus with other specified complication: Secondary | ICD-10-CM | POA: Diagnosis not present

## 2023-02-11 DIAGNOSIS — E11628 Type 2 diabetes mellitus with other skin complications: Secondary | ICD-10-CM | POA: Diagnosis not present

## 2023-02-11 DIAGNOSIS — I152 Hypertension secondary to endocrine disorders: Secondary | ICD-10-CM

## 2023-02-11 DIAGNOSIS — E118 Type 2 diabetes mellitus with unspecified complications: Secondary | ICD-10-CM

## 2023-02-11 DIAGNOSIS — E1159 Type 2 diabetes mellitus with other circulatory complications: Secondary | ICD-10-CM

## 2023-02-11 DIAGNOSIS — E785 Hyperlipidemia, unspecified: Secondary | ICD-10-CM | POA: Diagnosis not present

## 2023-02-11 LAB — BAYER DCA HB A1C WAIVED: HB A1C (BAYER DCA - WAIVED): 7.4 % — ABNORMAL HIGH (ref 4.8–5.6)

## 2023-02-11 NOTE — Progress Notes (Signed)
BP 106/71   Pulse 82   Ht 6' 4.5" (1.943 m)   Wt 263 lb (119.3 kg)   SpO2 97%   BMI 31.60 kg/m    Subjective:   Patient ID: FIELDS Evan [name], male    DOB: May 11, 1966, 57 y.o.   MRN: [id number]  HPI: Evan [name] is a 57 y.o. male presenting on 02/11/2023 for Medical Management of Chronic Issues and Diabetes   HPI Type 2 diabetes mellitus Patient comes in today for recheck of his diabetes. Patient has been currently taking Jardiance and Ozempic and Trulicity. Patient is currently on an ACE inhibitor/ARB. Patient has not seen an ophthalmologist this year. Patient denies any new issues with their feet. The symptom started onset as an adult hypertension and hyperlipidemia ARE RELATED TO DM   Hypertension Patient is currently on losartan and metoprolol and spironolactone and furosemide, and their blood pressure today is 106/71. Patient denies any lightheadedness or dizziness. Patient denies headaches, blurred vision, chest pains, shortness of breath, or weakness. Denies any side effects from medication and is content with current medication.   Hyperlipidemia Patient is coming in for recheck of his hyperlipidemia. The patient is currently taking atorvastatin. They deny any issues with myalgias or history of liver damage from it. They deny any focal numbness or weakness or chest pain.   Relevant past medical, surgical, family and social history reviewed and updated as indicated. Interim medical history since our last visit reviewed. Allergies and medications reviewed and updated.  Review of Systems  Constitutional:  Negative for chills and fever.  Eyes:  Negative for visual disturbance.  Respiratory:  Negative for shortness of breath and wheezing.   Cardiovascular:  Negative for chest pain and leg swelling.  Musculoskeletal:  Negative for back pain and gait problem.  Skin:  Negative for rash.  Neurological:  Negative for dizziness, weakness and numbness.  All other systems reviewed and  are negative.   Per HPI unless specifically indicated above   Allergies as of 02/11/2023   No Known Allergies      Medication List        Accurate as of February 11, 2023 11:38 AM. If you have any questions, ask your nurse or doctor.          STOP taking these medications    Trulicity 1.5 MG/0.5ML Sopn Generic drug: Dulaglutide Stopped by: Elige Radon Kalesha Irving       TAKE these medications    Accu-Chek Aviva device Test BS BID Dx E11.69   Accu-Chek Aviva Plus w/Device Kit 1 each by Does not apply route 4 (four) times daily.   Accu-Chek Guide test strip Generic drug: glucose blood Check BS in the morning and at bedtime Dx E11.8   aspirin EC 81 MG tablet Take 81 mg by mouth daily.   atorvastatin 80 MG tablet Commonly known as: LIPITOR Take 1 tablet (80 mg total) by mouth daily. What changed: when to take this   BD Pen Needle Nano 2nd Gen 32G X 4 MM Misc Generic drug: Insulin Pen Needle Use daily with insulin Dx E11.9, Z79.4   clopidogrel 75 MG tablet Commonly known as: PLAVIX Take 1 tablet (75 mg total) by mouth daily.   empagliflozin 25 MG Tabs tablet Commonly known as: Jardiance Take 1 tablet (25 mg total) by mouth daily.   famotidine 20 MG tablet Commonly known as: Pepcid Take 1 tablet (20 mg total) by mouth 2 (two) times daily. What changed:  when to take this  reasons to take this   fenofibrate 145 MG tablet Commonly known as: TRICOR Take 1 tablet (145 mg total) by mouth daily.   Fish Oil 1000 MG Caps Take 2,000 mg by mouth daily.   fluticasone 50 MCG/ACT nasal spray Commonly known as: FLONASE Place 1 spray into both nostrils daily.   furosemide 20 MG tablet Commonly known as: LASIX Take 1 tablet (20 mg total) by mouth daily.   losartan 25 MG tablet Commonly known as: COZAAR Take 1 tablet (25 mg total) by mouth daily. What changed: when to take this   metoprolol succinate 25 MG 24 hr tablet Commonly known as: Toprol XL Take 1  tablet (25 mg total) by mouth daily.   Semaglutide(0.25 or 0.5MG /DOS) 2 MG/3ML Sopn Inject 0.5 mg into the skin once a week.   sildenafil 20 MG tablet Commonly known as: REVATIO Take 1-5 tablets (20-100 mg total) by mouth daily as needed.   spironolactone 25 MG tablet Commonly known as: ALDACTONE Take 0.5 tablets (12.5 mg total) by mouth daily.         Objective:   BP 106/71   Pulse 82   Ht 6' 4.5" (1.943 m)   Wt 263 lb (119.3 kg)   SpO2 97%   BMI 31.60 kg/m   Wt Readings from Last 3 Encounters:  02/11/23 263 lb (119.3 kg)  01/12/23 269 lb (122 kg)  11/10/22 262 lb (118.8 kg)    Physical Exam Vitals and nursing note reviewed.  Constitutional:      General: He is not in acute distress.    Appearance: He is well-developed. He is not diaphoretic.  Eyes:     General: No scleral icterus.    Conjunctiva/sclera: Conjunctivae normal.  Neck:     Thyroid: No thyromegaly.  Cardiovascular:     Rate and Rhythm: Normal rate and regular rhythm.     Heart sounds: Normal heart sounds. No murmur heard. Pulmonary:     Effort: Pulmonary effort is normal. No respiratory distress.     Breath sounds: Normal breath sounds. No wheezing.  Musculoskeletal:        General: No swelling. Normal range of motion.     Cervical back: Neck supple.  Lymphadenopathy:     Cervical: No cervical adenopathy.  Skin:    General: Skin is warm and dry.     Findings: No rash.  Neurological:     Mental Status: He is alert and oriented to person, place, and time.     Coordination: Coordination normal.  Psychiatric:        Behavior: Behavior normal.     Results for orders placed or performed in visit on 01/28/23  HM DIABETES EYE EXAM  Result Value Ref Range   HM Diabetic Eye Exam No Retinopathy No Retinopathy    Assessment & Plan:   Problem List Items Addressed This Visit       Cardiovascular and Mediastinum   Hypertension associated with diabetes (HCC)     Endocrine   Type 2 diabetes  mellitus with pressure callus (HCC) - Primary   Relevant Orders   Bayer DCA Hb A1c Waived   Lipid panel   Microalbumin / creatinine urine ratio   Hyperlipidemia associated with type 2 diabetes mellitus (HCC)   Relevant Orders   Lipid panel   Type 2 diabetes mellitus with complications (HCC)    Patient declined foot exam today. A1c is 7.4.  Slightly better than last time, continue to focus on diet  Blood pressure  looks decent.  He is asymptomatic for it being low.  Sees cardiology for this as well. Follow up plan: Return in about 3 months (around 05/13/2023), or if symptoms worsen or fail to improve, for Diabetes recheck.  Counseling provided for all of the vaccine components Orders Placed This Encounter  Procedures   Bayer DCA Hb A1c Waived   Lipid panel   Microalbumin / creatinine urine ratio    Arville Care, MD [place] [facility] 02/11/2023, 11:38 AM

## 2023-02-11 NOTE — Patient Instructions (Signed)
Cardiology recommendations  Recommendations: Continue dual antiplatelet therapy. I decreased furosemide to 40 mg once daily.  Continue other heart failure medications.  Given intermittent hypotension.  I do not recommend starting Entresto at the present time.  I elected to start small dose losartan.  If this is well-tolerated, he can be switched as an outpatient to [facility].  I agree with the Life[facility]est.

## 2023-02-12 LAB — LIPID PANEL
Chol/HDL Ratio: 3.6 ratio (ref 0.0–5.0)
Cholesterol, Total: 119 mg/dL (ref 100–199)
HDL: 33 mg/dL — ABNORMAL LOW (ref >39)
LDL Chol Calc (NIH): 70 mg/dL (ref 0–99)
Triglycerides: 77 mg/dL (ref 0–149)
VLDL Cholesterol Cal: 16 mg/dL (ref 5–40)

## 2023-02-12 LAB — MICROALBUMIN / CREATININE URINE RATIO
Creatinine, Urine: 104.8 mg/dL
Microalb/Creat Ratio: 14 mg/g creat (ref 0–29)
Microalbumin, Urine: 14.6 ug/mL

## 2023-02-25 ENCOUNTER — Telehealth: Payer: Self-pay | Admitting: Family Medicine

## 2023-02-25 NOTE — Telephone Encounter (Signed)
Pt wants to know if Dr Dettinger will send in abx for legs no better. Pt seen dr 02/11/2023. Pt says he discuss with nurse when she called him about lab results. Use [facility].

## 2023-02-25 NOTE — Telephone Encounter (Signed)
Use

## 2023-02-26 NOTE — Telephone Encounter (Signed)
Appt scheduled for 10/11 at 2:55. Pt made aware.

## 2023-02-26 NOTE — Telephone Encounter (Signed)
If he feels like he has an infection in his legs then he needs to be seen, if his legs are worsening he also needs to be seen.  Please make an appointment ASAP.

## 2023-02-27 ENCOUNTER — Ambulatory Visit (INDEPENDENT_AMBULATORY_CARE_PROVIDER_SITE_OTHER): Payer: Medicaid Other

## 2023-02-27 ENCOUNTER — Ambulatory Visit: Payer: Medicaid Other | Admitting: Family Medicine

## 2023-02-27 ENCOUNTER — Encounter: Payer: Self-pay | Admitting: Family Medicine

## 2023-02-27 VITALS — Ht 76.5 in

## 2023-02-27 DIAGNOSIS — L0291 Cutaneous abscess, unspecified: Secondary | ICD-10-CM | POA: Diagnosis not present

## 2023-02-27 DIAGNOSIS — L03116 Cellulitis of left lower limb: Secondary | ICD-10-CM | POA: Diagnosis not present

## 2023-02-27 DIAGNOSIS — M1712 Unilateral primary osteoarthritis, left knee: Secondary | ICD-10-CM | POA: Diagnosis not present

## 2023-02-27 MED ORDER — CEFTRIAXONE SODIUM 1 G IJ SOLR
1.0000 g | Freq: Once | INTRAMUSCULAR | Status: AC
Start: 2023-02-27 — End: 2023-02-27
  Administered 2023-02-27: 1 g via INTRAMUSCULAR

## 2023-02-27 MED ORDER — SULFAMETHOXAZOLE-TRIMETHOPRIM 800-160 MG PO TABS
1.0000 | ORAL_TABLET | Freq: Two times a day (BID) | ORAL | 0 refills | Status: DC
Start: 2023-02-27 — End: 2023-04-19

## 2023-02-27 NOTE — Progress Notes (Signed)
Ht 6' 4.5" (1.943 m)   BMI 31.60 kg/m    Subjective:   Patient ID: Evan [name], male    DOB: 1965-10-15, 57 y.o.   MRN: [id number]  HPI: Evan [name] is a 57 y.o. male presenting on 02/27/2023 for Rash (Bilateral arms and legs) and Wound Check (Left knee)   HPI Rash and skin infection Patient has a wound on his left knee that developed after he feels like he was under the house and working with a water heater and got a cut there and he also developed a rash around the same time.  The rash is multiple small spots on his arms and legs, especially collecting around the hands and the feet that are dark purple spots that are somewhat itchy.  The wound on his left knee is over the kneecap and has started to swell and become red and irritated and inflamed.  He denies any fevers or chills.  His blood sugars are running about the same as they have been.  Relevant past medical, surgical, family and social history reviewed and updated as indicated. Interim medical history since our last visit reviewed. Allergies and medications reviewed and updated.  Review of Systems  Constitutional:  Negative for chills and fever.  Respiratory:  Negative for shortness of breath and wheezing.   Cardiovascular:  Negative for chest pain and leg swelling.  Skin:  Positive for color change, rash and wound.  All other systems reviewed and are negative.   Per HPI unless specifically indicated above   Allergies as of 02/27/2023   No Known Allergies      Medication List        Accurate as of February 27, 2023  3:46 PM. If you have any questions, ask your nurse or doctor.          Accu-Chek Aviva device Test BS BID Dx E11.69   Accu-Chek Aviva Plus w/Device Kit 1 each by Does not apply route 4 (four) times daily.   Accu-Chek Guide test strip Generic drug: glucose blood Check BS in the morning and at bedtime Dx E11.8   aspirin EC 81 MG tablet Take 81 mg by mouth daily.   atorvastatin 80 MG  tablet Commonly known as: LIPITOR Take 1 tablet (80 mg total) by mouth daily. What changed: when to take this   BD Pen Needle Nano 2nd Gen 32G X 4 MM Misc Generic drug: Insulin Pen Needle Use daily with insulin Dx E11.9, Z79.4   clopidogrel 75 MG tablet Commonly known as: PLAVIX Take 1 tablet (75 mg total) by mouth daily.   empagliflozin 25 MG Tabs tablet Commonly known as: Jardiance Take 1 tablet (25 mg total) by mouth daily.   famotidine 20 MG tablet Commonly known as: Pepcid Take 1 tablet (20 mg total) by mouth 2 (two) times daily. What changed:  when to take this reasons to take this   fenofibrate 145 MG tablet Commonly known as: TRICOR Take 1 tablet (145 mg total) by mouth daily.   Fish Oil 1000 MG Caps Take 2,000 mg by mouth daily.   fluticasone 50 MCG/ACT nasal spray Commonly known as: FLONASE Place 1 spray into both nostrils daily.   furosemide 20 MG tablet Commonly known as: LASIX Take 1 tablet (20 mg total) by mouth daily.   losartan 25 MG tablet Commonly known as: COZAAR Take 1 tablet (25 mg total) by mouth daily. What changed: when to take this   metoprolol succinate 25 MG 24  hr tablet Commonly known as: Toprol XL Take 1 tablet (25 mg total) by mouth daily.   Semaglutide(0.25 or 0.5MG /DOS) 2 MG/3ML Sopn Inject 0.5 mg into the skin once a week.   sildenafil 20 MG tablet Commonly known as: REVATIO Take 1-5 tablets (20-100 mg total) by mouth daily as needed.   spironolactone 25 MG tablet Commonly known as: ALDACTONE Take 0.5 tablets (12.5 mg total) by mouth daily.   sulfamethoxazole-trimethoprim 800-160 MG tablet Commonly known as: BACTRIM DS Take 1 tablet by mouth 2 (two) times daily. Started by: Elige Radon Rasul Decola         Objective:   Ht 6' 4.5" (1.943 m)   BMI 31.60 kg/m   Wt Readings from Last 3 Encounters:  02/11/23 263 lb (119.3 kg)  01/12/23 269 lb (122 kg)  11/10/22 262 lb (118.8 kg)    Physical Exam Vitals and nursing  note reviewed.  Constitutional:      Appearance: Normal appearance.  Skin:    General: Skin is warm and dry.     Findings: Rash (Scattered fine papular rash on arms and legs, more significant on hands and feet.) and wound (Erythematous with purulent drainage wound on left knee overlying the kneecap) present.  Neurological:     Mental Status: He is alert.     Able to probe and get purulent drainage out of the wound just superior to the kneecap tracking proximally about 2 cm in.  Thick purulent drainage.  Left knee x-ray:  no signs of joint involvement, await radiology read  Assessment & Plan:   Problem List Items Addressed This Visit   None Visit Diagnoses     Cellulitis of left lower extremity    -  Primary   Relevant Medications   cefTRIAXone (ROCEPHIN) injection 1 g (Start on 02/27/2023  4:00 PM)   sulfamethoxazole-trimethoprim (BACTRIM DS) 800-160 MG tablet   Other Relevant Orders   DG Knee 1-2 Views Left   Abscess       Relevant Medications   cefTRIAXone (ROCEPHIN) injection 1 g (Start on 02/27/2023  4:00 PM)   sulfamethoxazole-trimethoprim (BACTRIM DS) 800-160 MG tablet   Other Relevant Orders   DG Knee 1-2 Views Left       Will give Rocephin IM here in the office and then Bactrim. Follow up plan: Return if symptoms worsen or fail to improve, for 1 week wound recheck.  Counseling provided for all of the vaccine components Orders Placed This Encounter  Procedures   DG Knee 1-2 Views Left    Arville Care, MD [facility] 02/27/2023, 3:46 PM

## 2023-03-23 ENCOUNTER — Other Ambulatory Visit: Payer: Self-pay

## 2023-03-23 ENCOUNTER — Emergency Department (HOSPITAL_COMMUNITY): Payer: Medicaid Other

## 2023-03-23 ENCOUNTER — Emergency Department (HOSPITAL_COMMUNITY)
Admission: EM | Admit: 2023-03-23 | Discharge: 2023-03-23 | Disposition: A | Discharge status: Home / Self Care | Payer: Medicaid Other | Attending: Emergency Medicine | Admitting: Emergency Medicine

## 2023-03-23 DIAGNOSIS — Z1152 Encounter for screening for COVID-19: Secondary | ICD-10-CM | POA: Diagnosis not present

## 2023-03-23 DIAGNOSIS — R0602 Shortness of breath: Secondary | ICD-10-CM | POA: Diagnosis not present

## 2023-03-23 DIAGNOSIS — Z7902 Long term (current) use of antithrombotics/antiplatelets: Secondary | ICD-10-CM | POA: Insufficient documentation

## 2023-03-23 DIAGNOSIS — Z7982 Long term (current) use of aspirin: Secondary | ICD-10-CM | POA: Diagnosis not present

## 2023-03-23 DIAGNOSIS — R791 Abnormal coagulation profile: Secondary | ICD-10-CM | POA: Diagnosis not present

## 2023-03-23 DIAGNOSIS — I509 Heart failure, unspecified: Secondary | ICD-10-CM | POA: Diagnosis not present

## 2023-03-23 DIAGNOSIS — R7989 Other specified abnormal findings of blood chemistry: Secondary | ICD-10-CM | POA: Insufficient documentation

## 2023-03-23 DIAGNOSIS — I110 Hypertensive heart disease with heart failure: Secondary | ICD-10-CM | POA: Diagnosis not present

## 2023-03-23 DIAGNOSIS — Z79899 Other long term (current) drug therapy: Secondary | ICD-10-CM | POA: Diagnosis not present

## 2023-03-23 DIAGNOSIS — R778 Other specified abnormalities of plasma proteins: Secondary | ICD-10-CM | POA: Diagnosis not present

## 2023-03-23 DIAGNOSIS — R079 Chest pain, unspecified: Secondary | ICD-10-CM | POA: Diagnosis not present

## 2023-03-23 LAB — CBC
HCT: 44.3 % (ref 39.0–52.0)
Hemoglobin: 14.2 g/dL (ref 13.0–17.0)
MCH: 29.6 pg (ref 26.0–34.0)
MCHC: 32.1 g/dL (ref 30.0–36.0)
MCV: 92.3 fL (ref 80.0–100.0)
Platelets: 217 10*3/uL (ref 150–400)
RBC: 4.8 MIL/uL (ref 4.22–5.81)
RDW: 13.7 % (ref 11.5–15.5)
WBC: 8.8 10*3/uL (ref 4.0–10.5)
nRBC: 0 % (ref 0.0–0.2)

## 2023-03-23 LAB — COMPREHENSIVE METABOLIC PANEL
ALT: 28 U/L (ref 0–44)
AST: 25 U/L (ref 15–41)
Albumin: 3.6 g/dL (ref 3.5–5.0)
Alkaline Phosphatase: 82 U/L (ref 38–126)
Anion gap: 9 (ref 5–15)
BUN: 24 mg/dL — ABNORMAL HIGH (ref 6–20)
CO2: 26 mmol/L (ref 22–32)
Calcium: 9 mg/dL (ref 8.9–10.3)
Chloride: 103 mmol/L (ref 98–111)
Creatinine, Ser: 1.1 mg/dL (ref 0.61–1.24)
GFR, Estimated: >60 mL/min (ref >60)
Glucose, Bld: 146 mg/dL — ABNORMAL HIGH (ref 70–99)
Potassium: 3.7 mmol/L (ref 3.5–5.1)
Sodium: 138 mmol/L (ref 135–145)
Total Bilirubin: 0.6 mg/dL (ref <1.2)
Total Protein: 6.9 g/dL (ref 6.5–8.1)

## 2023-03-23 LAB — RESP PANEL BY RT-PCR (RSV, FLU A&B, COVID)  RVPGX2
Influenza A by PCR: NEGATIVE
Influenza B by PCR: NEGATIVE
Resp Syncytial Virus by PCR: NEGATIVE
SARS Coronavirus 2 by RT PCR: NEGATIVE

## 2023-03-23 LAB — BRAIN NATRIURETIC PEPTIDE: B Natriuretic Peptide: 880 pg/mL — ABNORMAL HIGH (ref 0.0–100.0)

## 2023-03-23 LAB — TROPONIN I (HIGH SENSITIVITY): Troponin I (High Sensitivity): 28 ng/L — ABNORMAL HIGH (ref <18)

## 2023-03-23 LAB — D-DIMER, QUANTITATIVE: D-Dimer, Quant: 1.83 ug{FEU}/mL — ABNORMAL HIGH (ref 0.00–0.50)

## 2023-03-23 MED ORDER — ENOXAPARIN SODIUM 120 MG/0.8ML IJ SOSY
120.0000 mg | PREFILLED_SYRINGE | Freq: Once | INTRAMUSCULAR | Status: AC
  Administered 2023-03-23: 120 mg via SUBCUTANEOUS
  Filled 2023-03-23: qty 0.8

## 2023-03-23 MED ORDER — FUROSEMIDE 20 MG PO TABS: ORAL_TABLET | ORAL | 1 refills | Status: DC

## 2023-03-23 MED ORDER — FUROSEMIDE 10 MG/ML IJ SOLN
40.0000 mg | Freq: Once | INTRAMUSCULAR | Status: AC
  Administered 2023-03-23: 40 mg via INTRAVENOUS
  Filled 2023-03-23: qty 4

## 2023-03-23 MED ORDER — IOHEXOL 350 MG/ML SOLN
75.0000 mL | Freq: Once | INTRAVENOUS | Status: AC | PRN
  Administered 2023-03-23: 75 mL via INTRAVENOUS

## 2023-03-23 NOTE — ED Notes (Signed)
[facility] here to transport pt to [facility] for CT

## 2023-03-23 NOTE — ED Triage Notes (Signed)
Pt c/o sob x 3 days, states it is worse when laying down at night, denies CP, denies dizziness, states pain in his back which is baseline

## 2023-03-23 NOTE — ED Provider Notes (Signed)
Pt arrives as transfer frm [facility] ED for CT scan for PE rule out.  Worsening SOB, orthopnea for 3 days.  Reports he ran out of his home lasix 20 mg daily dose 3 days ago.  No chest pain.  Labs showing elevated BNP 880 (from 600's), Ddimer 1.82, trop 28.  Cr wnl.  Hgb wnl.  He is comfortable here on room air, HR mild bradycardia.  Plan for CT PE, IV lasix for diuresis   7 pm -CT reviewed with no emergent findings.  Patient reassessed remains asymptomatic and quite comfortable in the room, no hypoxia.  I suspect is likely mild CHF exacerbation in the setting of running out of his medications.  I prescribed him Lasix to begin taking again at home, with a higher dose for the next 5 days and then taper down to his 20 mg.  We did give him IV Lasix here and produce a decent amount of urine.  He will follow-up with his cardiologist for this issue.  I have a low suspicion for ACS at this time.  He is asymptomatic and stable for discharge.  His family would be taking him home   Terald Sleeper, MD 03/23/23 [phone]

## 2023-03-23 NOTE — ED Notes (Signed)
Per MD, Evan [name], if CT scan is positive for a blood clot pt can come back here after CT scan for Tx; However, if CT scan is negative, pt can be discharged after CT

## 2023-03-23 NOTE — ED Provider Notes (Addendum)
[place] EMERGENCY DEPARTMENT AT [facility] Provider Note   CSN: [id number] Arrival date & time: 03/23/23  1041     History  Chief Complaint  Patient presents with   Shortness of Breath    Evan [name] is a 57 y.o. male.  Pt c/o feeling sob/doe in past three days. Denies cough or uri symptoms. No fever or chills. No chest pain or discomfort, but hx cad. No leg pain or swelling. Notes hx PE, on asa currently. No recent trauma, travel, surgery or immobility. No new leg edema. No orthopnea. Dyspnea is worse w exertion. No recent change in meds.   The history is provided by the patient and medical records.  Shortness of Breath Associated symptoms: no abdominal pain, no chest pain, no cough, no diaphoresis, no fever, no headaches, no neck pain, no rash, no sore throat and no vomiting        Home Medications Prior to Admission medications   Medication Sig Start Date End Date Taking? Authorizing Provider  aspirin EC 81 MG tablet Take 81 mg by mouth daily.    [provider]  atorvastatin (LIPITOR) 80 MG tablet Take 1 tablet (80 mg total) by mouth daily. Patient taking differently: Take 80 mg by mouth daily at 6 PM. 08/07/22   Dettinger, Elige Radon, MD  BD PEN NEEDLE NANO 2ND GEN 32G X 4 MM MISC Use daily with insulin Dx E11.9, Z79.4 06/22/20   Dettinger, Elige Radon, MD  Blood Glucose Monitoring Suppl (ACCU-CHEK AVIVA PLUS) w/Device KIT 1 each by Does not apply route 4 (four) times daily. 06/17/21   Dettinger, Elige Radon, MD  Blood Glucose Monitoring Suppl (ACCU-CHEK AVIVA) device Test BS BID Dx E11.69 05/24/20   Dettinger, Elige Radon, MD  clopidogrel (PLAVIX) 75 MG tablet Take 1 tablet (75 mg total) by mouth daily. 01/12/23   Alver Sorrow, NP  empagliflozin (JARDIANCE) 25 MG TABS tablet Take 1 tablet (25 mg total) by mouth daily. 08/07/22   Dettinger, Elige Radon, MD  famotidine (PEPCID) 20 MG tablet Take 1 tablet (20 mg total) by mouth 2 (two) times daily. Patient taking  differently: Take 20 mg by mouth 2 (two) times daily as needed for heartburn or indigestion. 08/07/22   Dettinger, Elige Radon, MD  fenofibrate (TRICOR) 145 MG tablet Take 1 tablet (145 mg total) by mouth daily. 08/07/22   Dettinger, Elige Radon, MD  fluticasone (FLONASE) 50 MCG/ACT nasal spray Place 1 spray into both nostrils daily. 08/21/21   Dettinger, Elige Radon, MD  furosemide (LASIX) 20 MG tablet Take 1 tablet (20 mg total) by mouth daily. 08/07/22   Dettinger, Elige Radon, MD  glucose blood (ACCU-CHEK GUIDE) test strip Check BS in the morning and at bedtime Dx E11.8 07/01/21   Dettinger, Elige Radon, MD  losartan (COZAAR) 25 MG tablet Take 1 tablet (25 mg total) by mouth daily. Patient taking differently: Take 25 mg by mouth at bedtime. 08/07/22   Dettinger, Elige Radon, MD  metoprolol succinate (TOPROL XL) 25 MG 24 hr tablet Take 1 tablet (25 mg total) by mouth daily. 01/12/23   Alver Sorrow, NP  Omega-3 Fatty Acids (FISH OIL) 1000 MG CAPS Take 2,000 mg by mouth daily.    [provider]  Semaglutide,0.25 or 0.5MG /DOS, 2 MG/3ML SOPN Inject 0.5 mg into the skin once a week. 01/22/23   Dettinger, Elige Radon, MD  sildenafil (REVATIO) 20 MG tablet Take 1-5 tablets (20-100 mg total) by mouth daily as needed. 11/15/21  Dettinger, Elige Radon, MD  spironolactone (ALDACTONE) 25 MG tablet Take 0.5 tablets (12.5 mg total) by mouth daily. 01/12/23   Alver Sorrow, NP  sulfamethoxazole-trimethoprim (BACTRIM DS) 800-160 MG tablet Take 1 tablet by mouth 2 (two) times daily. 02/27/23   Dettinger, Elige Radon, MD      Allergies    Patient has no known allergies.    Review of Systems   Review of Systems  Constitutional:  Negative for chills, diaphoresis and fever.  HENT:  Negative for sore throat.   Eyes:  Negative for redness.  Respiratory:  Positive for shortness of breath. Negative for cough.   Cardiovascular:  Negative for chest pain, palpitations and leg swelling.  Gastrointestinal:  Negative for abdominal pain  and vomiting.  Genitourinary:  Negative for flank pain.  Musculoskeletal:  Negative for neck pain.  Skin:  Negative for rash.  Neurological:  Negative for headaches.  Hematological:  Does not bruise/bleed easily.  Psychiatric/Behavioral:  Negative for confusion.     Physical Exam Updated Vital Signs BP 117/85   Pulse 100   Temp 98.3 F (36.8 C) (Oral)   Resp 19   SpO2 99%  Physical Exam Vitals and nursing note reviewed.  Constitutional:      Appearance: Normal appearance. He is well-developed.  HENT:     Head: Atraumatic.     Nose: Nose normal.     Mouth/Throat:     Mouth: Mucous membranes are moist.     Pharynx: Oropharynx is clear.  Eyes:     General: No scleral icterus.    Conjunctiva/sclera: Conjunctivae normal.  Neck:     Trachea: No tracheal deviation.  Cardiovascular:     Rate and Rhythm: Normal rate and regular rhythm.     Pulses: Normal pulses.     Heart sounds: Normal heart sounds. No murmur heard.    No friction rub. No gallop.  Pulmonary:     Effort: Pulmonary effort is normal. No accessory muscle usage or respiratory distress.     Breath sounds: Normal breath sounds.  Abdominal:     General: There is no distension.     Palpations: Abdomen is soft.     Tenderness: There is no abdominal tenderness.  Genitourinary:    Comments: No cva tenderness. Musculoskeletal:        General: No swelling or tenderness.     Cervical back: Normal range of motion and neck supple. No rigidity.     Right lower leg: No edema.     Left lower leg: No edema.  Skin:    General: Skin is warm and dry.     Findings: No rash.  Neurological:     Mental Status: He is alert.     Comments: Alert, speech clear.   Psychiatric:        Mood and Affect: Mood normal.     ED Results / Procedures / Treatments   Labs (all labs ordered are listed, but only abnormal results are displayed) Results for orders placed or performed during the hospital encounter of 03/23/23  Resp panel by  RT-PCR (RSV, Flu A&B, Covid) Anterior Nasal Swab   Specimen: Anterior Nasal Swab  Result Value Ref Range   SARS Coronavirus 2 by RT PCR NEGATIVE NEGATIVE   Influenza A by PCR NEGATIVE NEGATIVE   Influenza B by PCR NEGATIVE NEGATIVE   Resp Syncytial Virus by PCR NEGATIVE NEGATIVE  Brain natriuretic peptide  Result Value Ref Range   B Natriuretic Peptide 880.0 (H) 0.0 -  100.0 pg/mL  CBC  Result Value Ref Range   WBC 8.8 4.0 - 10.5 K/uL   RBC 4.80 4.22 - 5.81 MIL/uL   Hemoglobin 14.2 13.0 - 17.0 g/dL   HCT 09.8 11.9 - 14.7 %   MCV 92.3 80.0 - 100.0 fL   MCH 29.6 26.0 - 34.0 pg   MCHC 32.1 30.0 - 36.0 g/dL   RDW 82.9 56.2 - 13.0 %   Platelets 217 150 - 400 K/uL   nRBC 0.0 0.0 - 0.2 %  Comprehensive metabolic panel  Result Value Ref Range   Sodium 138 135 - 145 mmol/L   Potassium 3.7 3.5 - 5.1 mmol/L   Chloride 103 98 - 111 mmol/L   CO2 26 22 - 32 mmol/L   Glucose, Bld 146 (H) 70 - 99 mg/dL   BUN 24 (H) 6 - 20 mg/dL   Creatinine, Ser 8.65 0.61 - 1.24 mg/dL   Calcium 9.0 8.9 - 78.4 mg/dL   Total Protein 6.9 6.5 - 8.1 g/dL   Albumin 3.6 3.5 - 5.0 g/dL   AST 25 15 - 41 U/L   ALT 28 0 - 44 U/L   Alkaline Phosphatase 82 38 - 126 U/L   Total Bilirubin 0.6 <1.2 mg/dL   GFR, Estimated >69 >62 mL/min   Anion gap 9 5 - 15  D-dimer, quantitative  Result Value Ref Range   D-Dimer, Quant 1.83 (H) 0.00 - 0.50 ug/mL-FEU  Troponin I (High Sensitivity)  Result Value Ref Range   Troponin I (High Sensitivity) 28 (H) <18 ng/L   DG Knee 1-2 Views Left  Result Date: 02/27/2023 CLINICAL DATA:  Left knee cellulitis EXAM: LEFT KNEE - 1-2 VIEW COMPARISON:  None Available. FINDINGS: No acute fracture or dislocation. No aggressive osseous lesion. Normal alignment. 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 Mild osteoarthritis of the medial and lateral femorotibial compartments of right knee. 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 Prepatellar soft tissue edema concerning for cellulitis. No radiopaque foreign body or soft tissue emphysema. IMPRESSION: 1. Prepatellar soft tissue edema concerning for cellulitis. 2. No acute osseous injury of the left knee. Electronically Signed   By: Elige Ko M.D.   On: 02/27/2023 17:03     EKG EKG Interpretation Date/Time:  Monday March 23 2023 11:19:03 EST Ventricular Rate:  101 PR Interval:  161 QRS Duration:  94 QT Interval:  379 QTC Calculation: 492 R Axis:   24  Text Interpretation: Sinus tachycardia Non-specific ST-t changes Confirmed by Cathren Laine ([id number]) on 03/23/2023 11:24:21 AM  Radiology No results found.  Procedures Procedures    Medications Ordered in ED Medications  enoxaparin (LOVENOX) 100 mg/mL injection 1 mg/kg (has no administration in time range)    ED Course/ Medical Decision Making/ A&P                                 Medical Decision Making Problems Addressed: Elevated d-dimer: acute illness or injury    Details: R/o PE Elevated troponin: acute illness or injury SOB (shortness of breath): acute illness or injury with systemic symptoms that poses a threat to life or bodily functions  Amount and/or Complexity of Data Reviewed External Data Reviewed: notes. Labs: ordered. Decision-making details documented in ED Course. Radiology: ordered and independent interpretation performed. Decision-making details documented in ED Course. ECG/medicine tests: ordered and independent interpretation performed. Decision-making details documented  in ED Course. Discussion of management or test interpretation with external provider(s): EDP  Risk Prescription drug management. Decision regarding hospitalization.   Iv ns. Continuous pulse ox and cardiac monitoring. Labs ordered/sent. Imaging ordered.   Differential diagnosis includes acs, pna, pe, etc. Dispo decision including potential need for admission considered - will get labs and  imaging and reassess.   Reviewed nursing notes and prior charts for additional history. External reports reviewed.   Cardiac monitor: sinus rhythm, rate 100.  Labs reviewed/interpreted by me - wbc and hgb normal. Ddimer high - will get cta r/o PE - CT at [facility] is down, will transport to [facility] ED for CT. Dr Rodena Medin accepting doc. Lovenox dose given.   Xrays reviewed/interpreted by me - no pna.   Discussed plan for transfer/transport for CTA - patient is agreeable.   Currently breathing comfortably, hr 96, rr 16, pulse ox 98%. Pt appears stable for transport.            Final Clinical Impression(s) / ED Diagnoses Final diagnoses:  None    Rx / DC Orders ED Discharge Orders     None          Cathren Laine, MD 03/23/23 1348

## 2023-03-23 NOTE — ED Notes (Signed)
Pt appears to be in A fib on the cardiac monitor, states he does have a Hx of a fib

## 2023-03-23 NOTE — ED Notes (Signed)
Pt BIB [facility] due to elevated BNP and D-Dimer 1.83. pt alert and oriented.

## 2023-03-23 NOTE — ED Notes (Signed)
Attempted to call pts mother to inform her, per pts consent, that pt is going to [facility] for a CT scan. Plan of care depends of results of CT scan

## 2023-03-23 NOTE — Discharge Instructions (Addendum)
Please call your cardiology office for follow-up in the office.  You may be having a flareup of your heart failure which can cause you to feel short of breath.  I prescribed you Lasix to begin taking an at home.

## 2023-04-13 ENCOUNTER — Ambulatory Visit: Payer: Medicaid Other | Attending: Cardiology | Admitting: Cardiology

## 2023-04-13 ENCOUNTER — Encounter: Payer: Self-pay | Admitting: Cardiology

## 2023-04-13 VITALS — BP 108/84 | HR 99 | Ht 76.0 in | Wt 258.8 lb

## 2023-04-13 DIAGNOSIS — I25118 Atherosclerotic heart disease of native coronary artery with other forms of angina pectoris: Secondary | ICD-10-CM | POA: Diagnosis not present

## 2023-04-13 DIAGNOSIS — E785 Hyperlipidemia, unspecified: Secondary | ICD-10-CM | POA: Diagnosis not present

## 2023-04-13 DIAGNOSIS — E118 Type 2 diabetes mellitus with unspecified complications: Secondary | ICD-10-CM

## 2023-04-13 DIAGNOSIS — I255 Ischemic cardiomyopathy: Secondary | ICD-10-CM | POA: Diagnosis not present

## 2023-04-13 DIAGNOSIS — I152 Hypertension secondary to endocrine disorders: Secondary | ICD-10-CM

## 2023-04-13 DIAGNOSIS — I252 Old myocardial infarction: Secondary | ICD-10-CM | POA: Diagnosis not present

## 2023-04-13 DIAGNOSIS — E1159 Type 2 diabetes mellitus with other circulatory complications: Secondary | ICD-10-CM | POA: Diagnosis not present

## 2023-04-13 DIAGNOSIS — I5020 Unspecified systolic (congestive) heart failure: Secondary | ICD-10-CM | POA: Diagnosis not present

## 2023-04-13 MED ORDER — METOPROLOL SUCCINATE ER 25 MG PO TB24: 25.0000 mg | ORAL_TABLET | Freq: Every day | ORAL | 3 refills | Status: DC

## 2023-04-13 NOTE — Patient Instructions (Signed)
Medication Instructions:  Restart plavix  Increase lasix to 40mg   Take metoprolol succinate 25 mg daily  *If you need a refill on your cardiac medications before your next appointment, please call your pharmacy*   Lab Work: BMET,BNP today  Repeat Bmet in 2 weeks If you have labs (blood work) drawn today and your tests are completely normal, you will receive your results only by: MyChart Message (if you have MyChart) OR A paper copy in the mail If you have any lab test that is abnormal or we need to change your treatment, we will call you to review the results.   Testing/Procedures: None    Follow-Up: At [facility], you and your health needs are our priority.  As part of our continuing mission to provide you with exceptional heart care, we have created designated Provider Care Teams.  These Care Teams include your primary Cardiologist (physician) and Advanced Practice Providers (APPs -  Physician Assistants and Nurse Practitioners) who all work together to provide you with the care you need, when you need it.  We recommend signing up for the patient portal called "MyChart".  Sign up information is provided on this After Visit Summary.  MyChart is used to connect with patients for Virtual Visits (Telemedicine).  Patients are able to view lab/test results, encounter notes, upcoming appointments, etc.  Non-urgent messages can be sent to your provider as well.   To learn more about what you can do with MyChart, go to [email or web address].    Your next appointment:   1 month(s)  Provider:   Reather Littler, NP

## 2023-04-13 NOTE — Progress Notes (Signed)
Cardiology Office Note    Date:  04/13/2023  ID:  Evan [name], Evan [name] Sep 26, 1965, MRN [id number] PCP:  Dettinger, Elige Radon, MD  Cardiologist:  Nanetta Batty, MD  Electrophysiologist:  None   Chief Complaint: ED follow up   History of Present Illness: .    Evan [name] is a 57 y.o. male with visit-pertinent history of hypertension, hyperlipidemia, DM2, CAD s/p anterolateral MI in 11/2018 with intervention to proximal LAD and staged RCA intervention, ischemic cardiomyopathy with LVEF 20 to 25% at time of MI with repeat LVEF 35 to 40%.  In 09/2022 he was admitted to [facility] with right upper lobe pneumonia treated with IV antibiotics and cough medication.  Echo 10/06/2022 indicated LVEF 35%, grade 1 diastolic dysfunction, mild MR, LA mildly dilated, mild PAH, mild to moderately dilated LV.  His carvedilol dose was reduced from 25 to 12.5 mg twice daily.  On 03/23/2023 he presented to [facility], ED for 4 days of worsening shortness of breath.  Patient reported that he had run out of his home Lasix 20 mg daily for 3 days prior to visit.  BNP was elevated at 880, D-dimer 1.82, CT with no emergent findings, no evidence of PE.  It was felt that he likely had a mild CHF exacerbation in setting of running out of his medication.  His Lasix was increased to 40 mg daily for 5 days then to resume 20 mg daily.  Today he presents for follow up. He reports that he has been doing "okay".  He notes that following the return to Lasix 20 mg daily after his hospital visit he noticed increased shortness of breath and lower extremity edema.  He has not been monitoring his weights at home.  He also endorses difficulty breathing when laying down flat, resolves when he sits up.  He denies chest pain, palpitations, presyncope or syncope.  Patient reports that he did not start on metoprolol succinate after last office visit in August, he notes that he did not receive a new prescription.  Labwork independently  reviewed: 03/23/2023: Sodium 138, potassium 3.7, creatinine 1.10, BNP 880  ROS: .   Today he denies chest pain, shortness of breath, lower extremity edema, fatigue, palpitations, melena, hematuria, hemoptysis, diaphoresis, weakness, presyncope, syncope, and PND.  All other systems are reviewed and otherwise negative. Studies Reviewed: Marland Kitchen    EKG:  EKG is ordered today, personally reviewed, demonstrating  EKG Interpretation Date/Time:  Monday April 13 2023 13:46:18 EST Ventricular Rate:  99 PR Interval:  156 QRS Duration:  88 QT Interval:  364 QTC Calculation: 467 R Axis:   -19  Text Interpretation: Sinus rhythm with sinus arrhythmia with occasional Premature ventricular complexes old anterior lateral/anteroseptal MI No significant change since last tracing Confirmed by Reather Littler [phone]) on 04/13/2023 3:13:05 PM   CV Studies: Cardiac Studies & Procedures   CARDIAC CATHETERIZATION  CARDIAC CATHETERIZATION 11/26/2018  Narrative  Previously placed Ost LAD to Prox LAD drug eluting stent is widely patent.  Balloon angioplasty was performed.  Previously placed Mid LAD-2 drug eluting stent is widely patent.  Balloon angioplasty was performed.  Mid LAD-1 lesion is 30% stenosed.  Dist Cx lesion is 99% stenosed.  Prox RCA to Mid RCA lesion is 95% stenosed.  Dist RCA lesion is 30% stenosed.  RPAV lesion is 100% stenosed.  Post intervention, there is a 0% residual stenosis.  A drug-eluting stent was successfully placed using a STENT RESOLUTE ONYX 2.75X18.  1.  Widely patent  LAD stents with unchanged coronary anatomy. 2.  LVEDP was 13 mmHg with mild hypotension.  The patient was given 250 normal saline bolus. 3.  Successful angioplasty and drug-eluting stent placement to the proximal/mid right coronary artery.  Recommendations: Continue dual antiplatelet therapy. I decreased furosemide to 40 mg once daily.  Continue other heart failure medications.  Given intermittent  hypotension.  I do not recommend starting Entresto at the present time.  I elected to start small dose losartan.  If this is well-tolerated, he can be switched as an outpatient to [facility].  I agree with the LifeVest.  Findings Coronary Findings Diagnostic  Dominance: Right  Left Anterior Descending Collaterals Dist LAD filled by collaterals from RPDA.  Previously placed Ost LAD to Prox LAD drug eluting stent is widely patent. Vessel is the culprit lesion. The lesion is type C and thrombotic with left-to-right collateral flow. Mid LAD-1 lesion is 30% stenosed. The lesion is moderately calcified. Previously placed Mid LAD-2 drug eluting stent is widely patent. The lesion is not complex (non high-C) and distal to major branch.  Left Circumflex Dist Cx lesion is 99% stenosed.  First Obtuse Marginal Branch The vessel exhibits minimal luminal irregularities.  Second Obtuse Marginal Branch The vessel exhibits minimal luminal irregularities.  Right Coronary Artery Prox RCA to Mid RCA lesion is 95% stenosed. The lesion is not complex (non high-C). The lesion was not previously treated. The stenosis was measured by a visual reading. Pressure wire/FFR was not performed on the lesion. IVUS was not performed. Dist RCA lesion is 30% stenosed.  Right Posterior Atrioventricular Artery RPAV lesion is 100% stenosed.  Intervention  Prox RCA to Mid RCA lesion Stent Lesion length:  16 mm. CATHETER LAUNCHER 6FR JR4 guide catheter was inserted. Lesion crossed with guidewire using a WIRE RUNTHROUGH .[id number]. Pre-stent angioplasty was performed using a BALLOON SAPPHIRE 2.5X12. Maximum pressure:  10 atm. Inflation time:  30 sec. A drug-eluting stent was successfully placed using a STENT RESOLUTE ONYX [id number]. Maximum pressure: 14 atm. Inflation time: 20 sec. Post-stent angioplasty was performed using a BALLOON SAPPHIRE [place] 3.0X10. Maximum pressure:  14 atm. Inflation time:  20 sec. Post-Intervention  Lesion Assessment The intervention was successful. Pre-interventional TIMI flow is 3. Post-intervention TIMI flow is 3. No complications occurred at this lesion. There is a 0% residual stenosis post intervention.   CARDIAC CATHETERIZATION 11/24/2018  Narrative  Prox RCA to Mid RCA lesion is 95% stenosed.  Dist RCA lesion is 30% stenosed.  RPAV lesion is 100% stenosed.  Dist Cx lesion is 99% stenosed.  There is severe left ventricular systolic dysfunction.  LV end diastolic pressure is severely elevated.  The left ventricular ejection fraction is less than 25% by visual estimate.  Ost LAD to Prox LAD lesion is 100% stenosed.  Post intervention, there is a 0% residual stenosis.  A drug-eluting stent was successfully placed using a STENT RESOLUTE ONYX 3.5X22.  A drug-eluting stent was successfully placed using a STENT RESOLUTE ONYX [id number].  Mid LAD-1 lesion is 30% stenosed.  Mid LAD-2 lesion is 100% stenosed.  Post intervention, there is a 0% residual stenosis.  1.  Severe three-vessel coronary artery disease.  Occluded ostial and mid LAD likely due to late presenting anterior STEMI.  Significant distal left circumflex disease but the supplied territory is small.  Significant mid RCA stenosis with occluded posterior AV groove with left-to-right collaterals. 2.  Severely reduced LV systolic function with an EF of 20 to 25% with anterior wall akinesis.  LVEDP was 32 mmHg. 3.  Successful PCI and drug-eluting stent placement to the ostial as well as mid LAD.  Recommendations: The patient was given furosemide 40 mg IV after the case.  Continue furosemide 40 mg by mouth twice daily starting tomorrow. I started small dose carvedilol. If renal function remains stable, recommend adding a small dose ARB with plans to transition to [facility] as an outpatient.  In addition, should consider spironolactone if blood pressure allows. Recommend dual antiplatelet therapy for at least 1  year. Recommend staged mid RCA PCI likely on Friday if the patient remains stable.  The rest of his coronary artery disease should be treated medically.  Findings Coronary Findings Diagnostic  Dominance: Right  Left Anterior Descending Collaterals Dist LAD filled by collaterals from RPDA.  Ost LAD to Prox LAD lesion is 100% stenosed. Vessel is the culprit lesion. The lesion is type C and thrombotic with left-to-right collateral flow. The lesion was not previously treated. Mid LAD-1 lesion is 30% stenosed. The lesion is moderately calcified. Mid LAD-2 lesion is 100% stenosed. The lesion is not complex (non high-C) and distal to major branch. The lesion was not previously treated.  Left Circumflex Dist Cx lesion is 99% stenosed.  First Obtuse Marginal Branch The vessel exhibits minimal luminal irregularities.  Second Obtuse Marginal Branch The vessel exhibits minimal luminal irregularities.  Right Coronary Artery Prox RCA to Mid RCA lesion is 95% stenosed. Dist RCA lesion is 30% stenosed.  Right Posterior Atrioventricular Artery RPAV lesion is 100% stenosed.  Intervention  Ost LAD to Prox LAD lesion Stent Lesion length:  18 mm. CATHETER LAUNCHER 6FREBU 3.5 guide catheter was inserted. Lesion crossed with guidewire using a WIRE RUNTHROUGH .[id number]. Pre-stent angioplasty was performed using a BALLOON SAPPHIRE 2.5X15. Maximum pressure:  10 atm. Inflation time:  30 sec. A drug-eluting stent was successfully placed using a STENT RESOLUTE ONYX 3.5X22. Maximum pressure: 12 atm. Inflation time: 15 sec. Post-stent angioplasty was performed using a BALLOON SAPPHIRE [place] [id number]. Maximum pressure:  12 atm. Inflation time:  15 sec. Post-Intervention Lesion Assessment The intervention was successful. Pre-interventional TIMI flow is 0. Post-intervention TIMI flow is 3. No complications occurred at this lesion. There is a 0% residual stenosis post intervention.  Mid LAD-2 lesion Stent Lesion  length:  16 mm. CATHETER LAUNCHER 6FREBU 3.5 guide catheter was inserted. Lesion crossed with guidewire using a WIRE RUNTHROUGH .[id number]. Pre-stent angioplasty was performed using a BALLOON SAPPHIRE 2.5X15. Maximum pressure:  8 atm. Inflation time:  30 sec. A drug-eluting stent was successfully placed using a STENT RESOLUTE ONYX [id number]. Maximum pressure: 12 atm. Inflation time: 20 sec. Stent does not overlap previously placed stentPost-stent angioplasty was performed using a BALLOON SAPPHIRE [place] 3.0X12. Maximum pressure:  12 atm. Inflation time:  20 sec. Post-Intervention Lesion Assessment The intervention was successful. Pre-interventional TIMI flow is 0. Post-intervention TIMI flow is 3. No complications occurred at this lesion. There is a 0% residual stenosis post intervention.     ECHOCARDIOGRAM  ECHOCARDIOGRAM COMPLETE 08/23/2020  Narrative ECHOCARDIOGRAM REPORT    Patient Name:   DAMICHAEL PROIA Date of Exam: 08/23/2020 Medical Rec #:  [id number]      Height:       76.0 in Accession #:    [id number]     Weight:       288.8 lb Date of Birth:  1965/09/26      BSA:          2.590 m Patient Age:    53  years       BP:           133/89 mmHg Patient Gender: M              HR:           75 bpm. Exam Location:  [address]  Procedure: 2D Echo, Cardiac Doppler, Color Doppler and Intracardiac Opacification Agent  Indications:    I25.5 Ischemic cardiomyopathy  History:        Patient has prior history of Echocardiogram examinations, most recent 02/23/2019. Risk Factors:Hypertension, Dyslipidemia, Diabetes and Obesity.  Sonographer:    Daphine Deutscher RDCS Referring Phys: [id number] JESSE M CLEAVER  IMPRESSIONS   1. Hypokinesis of the septum, apical anterior and apical myocardium. Left ventricular ejection fraction, by estimation, is 35 to 40%. The left ventricle has moderately decreased function. The left ventricle demonstrates regional wall motion abnormalities (see scoring  diagram/findings for description). There is mild concentric left ventricular hypertrophy. Left ventricular diastolic parameters are consistent with Grade II diastolic dysfunction (pseudonormalization). 2. Right ventricular systolic function is normal. The right ventricular size is normal. 3. Left atrial size was mildly dilated. 4. The mitral valve is normal in structure. Trivial mitral valve regurgitation. No evidence of mitral stenosis. 5. The aortic valve is tricuspid. Aortic valve regurgitation is not visualized. No aortic stenosis is present. 6. The inferior vena cava is dilated in size with >50% respiratory variability, suggesting right atrial pressure of 8 mmHg.  FINDINGS Left Ventricle: Hypokinesis of the septum, apical anterior and apical myocardium. Left ventricular ejection fraction, by estimation, is 35 to 40%. The left ventricle has moderately decreased function. The left ventricle demonstrates regional wall motion abnormalities. Definity contrast agent was given IV to delineate the left ventricular endocardial borders. The left ventricular internal cavity size was normal in size. There is mild concentric left ventricular hypertrophy. Left ventricular diastolic parameters are consistent with Grade II diastolic dysfunction (pseudonormalization). Indeterminate filling pressures.  Right Ventricle: The right ventricular size is normal. No increase in right ventricular wall thickness. Right ventricular systolic function is normal.  Left Atrium: Left atrial size was mildly dilated.  Right Atrium: Right atrial size was normal in size.  Pericardium: There is no evidence of pericardial effusion.  Mitral Valve: The mitral valve is normal in structure. Trivial mitral valve regurgitation. No evidence of mitral valve stenosis.  Tricuspid Valve: The tricuspid valve is normal in structure. Tricuspid valve regurgitation is not demonstrated. No evidence of tricuspid stenosis.  Aortic Valve: The  aortic valve is tricuspid. Aortic valve regurgitation is not visualized. No aortic stenosis is present.  Pulmonic Valve: The pulmonic valve was normal in structure. Pulmonic valve regurgitation is not visualized. No evidence of pulmonic stenosis.  Aorta: The aortic root is normal in size and structure.  Venous: The inferior vena cava is dilated in size with greater than 50% respiratory variability, suggesting right atrial pressure of 8 mmHg.  IAS/Shunts: No atrial level shunt detected by color flow Doppler.   LEFT VENTRICLE PLAX 2D LVIDd:         5.00 cm  Diastology LVIDs:         4.10 cm  LV e' medial:    7.07 cm/s LV PW:         1.10 cm  LV E/e' medial:  12.2 LV IVS:        1.10 cm  LV e' lateral:   6.74 cm/s LVOT diam:     2.70 cm  LV E/e' lateral: 12.8  LV SV:         115 LV SV Index:   44 LVOT Area:     5.73 cm   RIGHT VENTRICLE             IVC RV Basal diam:  4.10 cm     IVC diam: 2.10 cm RV S prime:     20.90 cm/s TAPSE (M-mode): 3.3 cm  LEFT ATRIUM             Index       RIGHT ATRIUM           Index LA diam:        5.40 cm 2.09 cm/m  RA Area:     11.90 cm LA Vol (A2C):   81.9 ml 31.63 ml/m RA Volume:   28.70 ml  11.08 ml/m LA Vol (A4C):   71.4 ml 27.57 ml/m LA Biplane Vol: 78.5 ml 30.31 ml/m AORTIC VALVE LVOT Vmax:   110.00 cm/s LVOT Vmean:  74.100 cm/s LVOT VTI:    0.200 m  AORTA Ao Root diam: 3.90 cm  MITRAL VALVE MV Area (PHT): 3.91 cm    SHUNTS MV Decel Time: 194 msec    Systemic VTI:  0.20 m MV E velocity: 86.10 cm/s  Systemic Diam: 2.70 cm MV A velocity: 78.00 cm/s MV E/A ratio:  1.10  Chilton Si MD Electronically signed by Chilton Si MD Signature Date/Time: 08/23/2020/4:21:39 PM    Final              Current Reported Medications:.    Current Meds  Medication Sig   aspirin EC 81 MG tablet Take 81 mg by mouth daily.   atorvastatin (LIPITOR) 80 MG tablet Take 1 tablet (80 mg total) by mouth daily. (Patient taking  differently: Take 80 mg by mouth daily at 6 PM.)   BD PEN NEEDLE NANO 2ND GEN 32G X 4 MM MISC Use daily with insulin Dx E11.9, Z79.4   Blood Glucose Monitoring Suppl (ACCU-CHEK AVIVA PLUS) w/Device KIT 1 each by Does not apply route 4 (four) times daily.   Blood Glucose Monitoring Suppl (ACCU-CHEK AVIVA) device Test BS BID Dx E11.69   empagliflozin (JARDIANCE) 25 MG TABS tablet Take 1 tablet (25 mg total) by mouth daily.   fenofibrate (TRICOR) 145 MG tablet Take 1 tablet (145 mg total) by mouth daily.   furosemide (LASIX) 20 MG tablet Take 1 tablet (20 mg total) by mouth daily.   glucose blood (ACCU-CHEK GUIDE) test strip Check BS in the morning and at bedtime Dx E11.8   losartan (COZAAR) 25 MG tablet Take 1 tablet (25 mg total) by mouth daily. (Patient taking differently: Take 25 mg by mouth at bedtime.)   Omega-3 Fatty Acids (FISH OIL) 1000 MG CAPS Take 2,000 mg by mouth daily.   Semaglutide,0.25 or 0.5MG /DOS, 2 MG/3ML SOPN Inject 0.5 mg into the skin once a week.   sildenafil (REVATIO) 20 MG tablet Take 1-5 tablets (20-100 mg total) by mouth daily as needed.   spironolactone (ALDACTONE) 25 MG tablet Take 0.5 tablets (12.5 mg total) by mouth daily.   Physical Exam:    VS:  BP 108/84 (BP Location: Left Arm, Patient Position: Sitting, Cuff Size: Normal)   Pulse 99   Ht 6\' 4"  (1.93 m)   Wt 258 lb 12.8 oz (117.4 kg)   SpO2 97%   BMI 31.50 kg/m    Wt Readings from Last 3 Encounters:  04/13/23 258 lb 12.8 oz (117.4 kg)  03/23/23 259 lb 1.6 oz (117.5 kg)  02/11/23 263 lb (119.3 kg)    GEN: Well nourished, well developed in no acute distress NECK: No JVD; No carotid bruits CARDIAC: RRR, no murmurs, rubs, gallops RESPIRATORY:  Clear to auscultation without rales, wheezing or rhonchi  ABDOMEN: Soft, non-tender, non-distended EXTREMITIES:  Bilateral +1 lower extremity edema to mid shin; No acute deformity   Asessement and Plan:.    Ischemic cardiomyopathy: LVEF 20 to 25% at time of MI in  2020, most recent echo on 10/07/2022 indicated LVEF 35%.  Patient presented to the ED on 03/23/2023 for worsening shortness of breath, he had run out of his home Lasix 3 days prior.  His BNP was elevated at 880, given IV Lasix with up titration of Lasix for 5 days. Today he reports increased shortness of breath, orthopnea and lower extremity edema.  On exam lung sounds are clear, he does have +1 bilateral lower extremity edema up to mid shin.  He notes that his breathing had significantly improved on Lasix 40 mg daily, worsened again with decrease to 20 mg daily.  Also reviewed his EKG which indicates heart rate of 99 bpm, discussed with patient he reports that he did not receive a prescription for metoprolol succinate 25 mg daily, will also have him start this.  Increase Lasix to 40 mg daily, check BMET and BNP today, repeat BMET in two weeks. Continue GDMT of Jardiance, losartan and spironolactone. Can consider change to [facility] on follow up, will defer now given need for increased diuretic and starting of metoprolol succinate.   Hypertension: Blood pressure today 108/84.  He reports that he does not regularly monitor his blood pressure at home, encouraged to monitor given increased Lasix and starting of metoprolol succinate.  Continue current antihypertensive regimen as noted above.  CAD: S/p anterolateral MI in 11/2018 with intervention to proximal LAD and staged RCA intervention. Stable with no anginal symptoms. No indication for ischemic evaluation. Continue aspirin, atorvastatin and Toprol. Patient reports he was previously told by a provider at [facility] to stop Plavix, unclear why. Given history of CAD instructed to resume.   Sinus tachycardia: Heart rate today 99 bpm.  Patient reports that he never started on metoprolol succinate as he was unable to obtain prescription.  Instructed to start we will send prescription of metoprolol succinate 25 mg daily to his pharmacy.  DM2: Monitored and managed  per PCP.   Hyperlipidemia: Last lipid profile 02/11/2023 indicated total cholesterol 119, HDL 33, triglycerides 77 and LDL 70.  On Lipitor 80 mg daily.    Disposition: F/u with Reather Littler, NP in four weeks.   Signed, Rip Harbour, NP

## 2023-04-18 ENCOUNTER — Emergency Department (HOSPITAL_COMMUNITY): Payer: Medicaid Other

## 2023-04-18 ENCOUNTER — Other Ambulatory Visit: Payer: Self-pay

## 2023-04-18 ENCOUNTER — Inpatient Hospital Stay (HOSPITAL_COMMUNITY)
Admission: EM | Admit: 2023-04-18 | Discharge: 2023-04-21 | DRG: 445 | Disposition: A | Discharge status: Home / Self Care | Payer: Medicaid Other | Attending: Family Medicine | Admitting: Family Medicine

## 2023-04-18 DIAGNOSIS — I110 Hypertensive heart disease with heart failure: Secondary | ICD-10-CM | POA: Diagnosis present

## 2023-04-18 DIAGNOSIS — I255 Ischemic cardiomyopathy: Secondary | ICD-10-CM | POA: Diagnosis not present

## 2023-04-18 DIAGNOSIS — E1165 Type 2 diabetes mellitus with hyperglycemia: Secondary | ICD-10-CM | POA: Diagnosis not present

## 2023-04-18 DIAGNOSIS — Z823 Family history of stroke: Secondary | ICD-10-CM

## 2023-04-18 DIAGNOSIS — R0989 Other specified symptoms and signs involving the circulatory and respiratory systems: Secondary | ICD-10-CM | POA: Diagnosis not present

## 2023-04-18 DIAGNOSIS — E1169 Type 2 diabetes mellitus with other specified complication: Secondary | ICD-10-CM | POA: Diagnosis not present

## 2023-04-18 DIAGNOSIS — Z7982 Long term (current) use of aspirin: Secondary | ICD-10-CM

## 2023-04-18 DIAGNOSIS — I252 Old myocardial infarction: Secondary | ICD-10-CM

## 2023-04-18 DIAGNOSIS — R42 Dizziness and giddiness: Secondary | ICD-10-CM

## 2023-04-18 DIAGNOSIS — Z6831 Body mass index (BMI) 31.0-31.9, adult: Secondary | ICD-10-CM | POA: Diagnosis not present

## 2023-04-18 DIAGNOSIS — Z955 Presence of coronary angioplasty implant and graft: Secondary | ICD-10-CM | POA: Diagnosis not present

## 2023-04-18 DIAGNOSIS — M109 Gout, unspecified: Secondary | ICD-10-CM | POA: Diagnosis present

## 2023-04-18 DIAGNOSIS — Z8249 Family history of ischemic heart disease and other diseases of the circulatory system: Secondary | ICD-10-CM

## 2023-04-18 DIAGNOSIS — Z79899 Other long term (current) drug therapy: Secondary | ICD-10-CM | POA: Diagnosis not present

## 2023-04-18 DIAGNOSIS — R109 Unspecified abdominal pain: Secondary | ICD-10-CM | POA: Insufficient documentation

## 2023-04-18 DIAGNOSIS — K810 Acute cholecystitis: Secondary | ICD-10-CM | POA: Diagnosis not present

## 2023-04-18 DIAGNOSIS — R7989 Other specified abnormal findings of blood chemistry: Secondary | ICD-10-CM | POA: Insufficient documentation

## 2023-04-18 DIAGNOSIS — Z833 Family history of diabetes mellitus: Secondary | ICD-10-CM | POA: Diagnosis not present

## 2023-04-18 DIAGNOSIS — G4730 Sleep apnea, unspecified: Secondary | ICD-10-CM | POA: Diagnosis present

## 2023-04-18 DIAGNOSIS — Z811 Family history of alcohol abuse and dependence: Secondary | ICD-10-CM

## 2023-04-18 DIAGNOSIS — I4891 Unspecified atrial fibrillation: Secondary | ICD-10-CM | POA: Diagnosis present

## 2023-04-18 DIAGNOSIS — I5042 Chronic combined systolic (congestive) and diastolic (congestive) heart failure: Secondary | ICD-10-CM | POA: Diagnosis not present

## 2023-04-18 DIAGNOSIS — E782 Mixed hyperlipidemia: Secondary | ICD-10-CM | POA: Diagnosis present

## 2023-04-18 DIAGNOSIS — Z7984 Long term (current) use of oral hypoglycemic drugs: Secondary | ICD-10-CM | POA: Diagnosis not present

## 2023-04-18 DIAGNOSIS — Z7985 Long-term (current) use of injectable non-insulin antidiabetic drugs: Secondary | ICD-10-CM

## 2023-04-18 DIAGNOSIS — Z803 Family history of malignant neoplasm of breast: Secondary | ICD-10-CM

## 2023-04-18 DIAGNOSIS — K219 Gastro-esophageal reflux disease without esophagitis: Secondary | ICD-10-CM | POA: Diagnosis not present

## 2023-04-18 DIAGNOSIS — Z9861 Coronary angioplasty status: Secondary | ICD-10-CM

## 2023-04-18 DIAGNOSIS — Z7902 Long term (current) use of antithrombotics/antiplatelets: Secondary | ICD-10-CM

## 2023-04-18 DIAGNOSIS — I959 Hypotension, unspecified: Secondary | ICD-10-CM | POA: Diagnosis present

## 2023-04-18 DIAGNOSIS — I2510 Atherosclerotic heart disease of native coronary artery without angina pectoris: Secondary | ICD-10-CM

## 2023-04-18 DIAGNOSIS — R0602 Shortness of breath: Secondary | ICD-10-CM | POA: Diagnosis not present

## 2023-04-18 DIAGNOSIS — R55 Syncope and collapse: Secondary | ICD-10-CM

## 2023-04-18 DIAGNOSIS — I25118 Atherosclerotic heart disease of native coronary artery with other forms of angina pectoris: Secondary | ICD-10-CM

## 2023-04-18 DIAGNOSIS — I10 Essential (primary) hypertension: Secondary | ICD-10-CM | POA: Diagnosis present

## 2023-04-18 LAB — COMPREHENSIVE METABOLIC PANEL
ALT: 29 U/L (ref 0–44)
AST: 48 U/L — ABNORMAL HIGH (ref 15–41)
Albumin: 3.5 g/dL (ref 3.5–5.0)
Alkaline Phosphatase: 82 U/L (ref 38–126)
Anion gap: 9 (ref 5–15)
BUN: 26 mg/dL — ABNORMAL HIGH (ref 6–20)
CO2: 23 mmol/L (ref 22–32)
Calcium: 9.3 mg/dL (ref 8.9–10.3)
Chloride: 104 mmol/L (ref 98–111)
Creatinine, Ser: 1.25 mg/dL — ABNORMAL HIGH (ref 0.61–1.24)
GFR, Estimated: >60 mL/min (ref >60)
Glucose, Bld: 182 mg/dL — ABNORMAL HIGH (ref 70–99)
Potassium: 4.3 mmol/L (ref 3.5–5.1)
Sodium: 136 mmol/L (ref 135–145)
Total Bilirubin: 0.8 mg/dL (ref <1.2)
Total Protein: 7.3 g/dL (ref 6.5–8.1)

## 2023-04-18 LAB — CBC WITH DIFFERENTIAL/PLATELET
Abs Immature Granulocytes: 0.03 10*3/uL (ref 0.00–0.07)
Basophils Absolute: 0.1 10*3/uL (ref 0.0–0.1)
Basophils Relative: 1 %
Eosinophils Absolute: 0.1 10*3/uL (ref 0.0–0.5)
Eosinophils Relative: 1 %
HCT: 48.9 % (ref 39.0–52.0)
Hemoglobin: 15.4 g/dL (ref 13.0–17.0)
Immature Granulocytes: 0 %
Lymphocytes Relative: 19 %
Lymphs Abs: 1.9 10*3/uL (ref 0.7–4.0)
MCH: 29.1 pg (ref 26.0–34.0)
MCHC: 31.5 g/dL (ref 30.0–36.0)
MCV: 92.4 fL (ref 80.0–100.0)
Monocytes Absolute: 0.8 10*3/uL (ref 0.1–1.0)
Monocytes Relative: 8 %
Neutro Abs: 7.2 10*3/uL (ref 1.7–7.7)
Neutrophils Relative %: 71 %
Platelets: 268 10*3/uL (ref 150–400)
RBC: 5.29 MIL/uL (ref 4.22–5.81)
RDW: 14.3 % (ref 11.5–15.5)
WBC: 10 10*3/uL (ref 4.0–10.5)
nRBC: 0 % (ref 0.0–0.2)

## 2023-04-18 LAB — BRAIN NATRIURETIC PEPTIDE: B Natriuretic Peptide: 1039 pg/mL — ABNORMAL HIGH (ref 0.0–100.0)

## 2023-04-18 LAB — TROPONIN I (HIGH SENSITIVITY)
Troponin I (High Sensitivity): 51 ng/L — ABNORMAL HIGH (ref <18)
Troponin I (High Sensitivity): 47 ng/L — ABNORMAL HIGH (ref <18)

## 2023-04-18 LAB — D-DIMER, QUANTITATIVE: D-Dimer, Quant: 1.6 ug{FEU}/mL — ABNORMAL HIGH (ref 0.00–0.50)

## 2023-04-18 LAB — CBG MONITORING, ED: Glucose-Capillary: 179 mg/dL — ABNORMAL HIGH (ref 70–99)

## 2023-04-18 MED ORDER — FENTANYL CITRATE PF 50 MCG/ML IJ SOSY
50.0000 ug | PREFILLED_SYRINGE | Freq: Once | INTRAMUSCULAR | Status: AC
  Administered 2023-04-18: 50 ug via INTRAVENOUS
  Filled 2023-04-18: qty 1

## 2023-04-18 MED ORDER — SODIUM CHLORIDE 0.9 % IV SOLN
2.0000 g | Freq: Once | INTRAVENOUS | Status: AC
  Administered 2023-04-18: 2 g via INTRAVENOUS
  Filled 2023-04-18: qty 12.5

## 2023-04-18 MED ORDER — ONDANSETRON HCL 4 MG/2ML IJ SOLN
4.0000 mg | Freq: Once | INTRAMUSCULAR | Status: AC
Start: 2023-04-18 — End: 2023-04-18
  Administered 2023-04-18: 4 mg via INTRAVENOUS
  Filled 2023-04-18: qty 2

## 2023-04-18 MED ORDER — IOHEXOL 350 MG/ML SOLN
100.0000 mL | Freq: Once | INTRAVENOUS | Status: AC | PRN
  Administered 2023-04-18: 100 mL via INTRAVENOUS

## 2023-04-18 MED ORDER — METRONIDAZOLE 500 MG/100ML IV SOLN
500.0000 mg | Freq: Two times a day (BID) | INTRAVENOUS | Status: DC
  Administered 2023-04-18 – 2023-04-21 (×6): 500 mg via INTRAVENOUS
  Filled 2023-04-18 (×6): qty 100

## 2023-04-18 NOTE — ED Notes (Signed)
Patient transported to CT 

## 2023-04-18 NOTE — ED Notes (Signed)
Pt transported back to ED room 5.

## 2023-04-18 NOTE — ED Triage Notes (Signed)
Pt arrived via POV from home c/o near syncope, dizziness and SOB. Pt presents with non-productive cough. Pt reports being prescribed new medication and today was the first day he took it. Pt dyspnea with exertion.

## 2023-04-18 NOTE — H&P (Incomplete)
History and Physical    Patient: Evan [name] DOB: 1966/05/13 DOA: 04/18/2023 DOS: the patient was seen and examined on 04/19/2023 PCP: Dettinger, Elige Radon, MD  Patient coming from: Home  Chief Complaint:  Chief Complaint  Patient presents with   Near Syncope   HPI: Evan [name] is a 57 y.o. male with medical history significant of Hypertension, hyperlipidemia, GERD, T2DM, CAD status post PCI, ischemic cardiomyopathy who presents to the emergency department due to shortness of breath, lightheadedness, abdominal pain in the lower quadrants which started this afternoon (about 45 minutes) after taking new metoprolol succinate 25 mg that was newly prescribed by his physician.  Symptoms worsens with ambulation.  He denies syncopal or near syncopal episode, but complained of bilateral swelling in feet and some dried wounds  He was recently admitted from 5/20 to 5/23 at [facility] due to right upper lobe PNA and was treated with IV antibiotics.  Patient became hypotensive during this admission and was taken off several of his BP meds at that time.  ED Course:  In the emergency department, he was hemodynamically stable.  Workup in the ED showed normal CBC and BMP except for BUN/creatinine 26/1.25 (baseline creatinine at 1.0-1.2) and blood glucose level at 182.  BNP 1039 (chronically elevated, this was 880 about 3 weeks ago).,  D-dimer 1.60, troponin 51 > 47. CT angiography of chest and CT abdomen and pelvis with contrast showed no evidence of significant pulmonary embolus. Mild interstitial changes in the lungs, possibly chronic fibrosis or acute edema. No focal consolidation. 3. Gallbladder wall thickening with pericholecystic edema, likely cholecystitis. No discrete stones are identified. Chest x-ray showed no significant interval change Patient was treated with IV cefepime, Flagyl.  Zofran was given due to nausea and IV fentanyl 50 mcg x 1 was given. General surgery was  consulted and will follow-up with patient in the morning per EDP Hospitalist was asked to admit patient for further evaluation and management.  Review of Systems: Review of systems as noted in the HPI. All other systems reviewed and are negative.   Past Medical History:  Diagnosis Date   Arthritis    Atrial fibrillation (HCC)    CHF (congestive heart failure) (HCC)    Diabetes mellitus without complication (HCC)    Gout    Heart attack (HCC) 10/2018   Hyperlipidemia    Hypertension    Morbid obesity (HCC) 12/22/2016   Sleep apnea    had test twice unable to complete   Past Surgical History:  Procedure Laterality Date   ANKLE ARTHROSCOPY Right    COLONOSCOPY WITH PROPOFOL N/A 09/18/2020   Procedure: COLONOSCOPY WITH PROPOFOL;  Surgeon: Sherrilyn Rist, MD;  Location: [facility] ENDOSCOPY;  Service: Gastroenterology;  Laterality: N/A;  09-18-2020   CORONARY STENT INTERVENTION N/A 11/24/2018   Procedure: CORONARY STENT INTERVENTION;  Surgeon: Iran Ouch, MD;  Location: [facility] INVASIVE CV LAB;  Service: Cardiovascular;  Laterality: N/A;  prox and mid LAD   CORONARY STENT INTERVENTION N/A 11/26/2018   Procedure: CORONARY STENT INTERVENTION;  Surgeon: Iran Ouch, MD;  Location: [facility] INVASIVE CV LAB;  Service: Cardiovascular;  Laterality: N/A;   CYST REMOVAL HAND     KNEE ARTHROSCOPY Left    LEFT HEART CATH AND CORONARY ANGIOGRAPHY N/A 11/24/2018   Procedure: LEFT HEART CATH AND CORONARY ANGIOGRAPHY;  Surgeon: Iran Ouch, MD;  Location: [facility] INVASIVE CV LAB;  Service: Cardiovascular;  Laterality: N/A;   POLYPECTOMY  09/18/2020   Procedure:  POLYPECTOMY;  Surgeon: Sherrilyn Rist, MD;  Location: [facility];  Service: Gastroenterology;;    Social History:  reports that he has never smoked. He has never used smokeless tobacco. He reports current alcohol use of about 12.0 standard drinks of alcohol per week. He reports that he does not use drugs.   No Known Allergies  Family History   Problem Relation Age of Onset   Diabetes Mother    Stroke Mother    Early death Father    Heart attack Father        died from heart attack at the age of 25   Alcohol abuse Father    Heart disease Father    Breast cancer Sister    Hypertension Brother    Heart attack Maternal Grandfather        died from heart attack at the age of 62   Early death Paternal Grandfather    Heart attack Paternal Grandfather    Colon cancer Neg Hx    Esophageal cancer Neg Hx    Rectal cancer Neg Hx    Stomach cancer Neg Hx      Prior to Admission medications   Medication Sig Start Date End Date Taking? Authorizing Provider  aspirin EC 81 MG tablet Take 81 mg by mouth daily.   Yes [provider]  empagliflozin (JARDIANCE) 25 MG TABS tablet Take 1 tablet (25 mg total) by mouth daily. 08/07/22  Yes Dettinger, Elige Radon, MD  metoprolol succinate (TOPROL XL) 25 MG 24 hr tablet Take 1 tablet (25 mg total) by mouth daily. 04/13/23  Yes West, Katlyn D, NP  atorvastatin (LIPITOR) 80 MG tablet Take 1 tablet (80 mg total) by mouth daily. Patient taking differently: Take 80 mg by mouth daily at 6 PM. 08/07/22   Dettinger, Elige Radon, MD  BD PEN NEEDLE NANO 2ND GEN 32G X 4 MM MISC Use daily with insulin Dx E11.9, Z79.4 06/22/20   Dettinger, Elige Radon, MD  Blood Glucose Monitoring Suppl (ACCU-CHEK AVIVA PLUS) w/Device KIT 1 each by Does not apply route 4 (four) times daily. 06/17/21   Dettinger, Elige Radon, MD  Blood Glucose Monitoring Suppl (ACCU-CHEK AVIVA) device Test BS BID Dx E11.69 05/24/20   Dettinger, Elige Radon, MD  clopidogrel (PLAVIX) 75 MG tablet Take 1 tablet (75 mg total) by mouth daily. Patient not taking: Reported on 04/13/2023 01/12/23   Alver Sorrow, NP  famotidine (PEPCID) 20 MG tablet Take 1 tablet (20 mg total) by mouth 2 (two) times daily. Patient not taking: Reported on 04/13/2023 08/07/22   Dettinger, Elige Radon, MD  fenofibrate (TRICOR) 145 MG tablet Take 1 tablet (145 mg total) by mouth daily.  08/07/22   Dettinger, Elige Radon, MD  fluticasone (FLONASE) 50 MCG/ACT nasal spray Place 1 spray into both nostrils daily. Patient not taking: Reported on 04/13/2023 08/21/21   Dettinger, Elige Radon, MD  furosemide (LASIX) 20 MG tablet Take 1 tablet (20 mg total) by mouth daily. 08/07/22   Dettinger, Elige Radon, MD  furosemide (LASIX) 20 MG tablet Take 2 tablets (40 mg total) by mouth daily for 5 days, THEN 1 tablet (20 mg total) daily. Patient not taking: Reported on 04/13/2023 03/23/23 04/27/23  Terald Sleeper, MD  glucose blood (ACCU-CHEK GUIDE) test strip Check BS in the morning and at bedtime Dx E11.8 07/01/21   Dettinger, Elige Radon, MD  losartan (COZAAR) 25 MG tablet Take 1 tablet (25 mg total) by mouth daily. Patient taking differently: Take  25 mg by mouth at bedtime. 08/07/22   Dettinger, Elige Radon, MD  Omega-3 Fatty Acids (FISH OIL) 1000 MG CAPS Take 2,000 mg by mouth daily.    [provider]  Semaglutide,0.25 or 0.5MG /DOS, 2 MG/3ML SOPN Inject 0.5 mg into the skin once a week. 01/22/23   Dettinger, Elige Radon, MD  sildenafil (REVATIO) 20 MG tablet Take 1-5 tablets (20-100 mg total) by mouth daily as needed. 11/15/21   Dettinger, Elige Radon, MD  spironolactone (ALDACTONE) 25 MG tablet Take 0.5 tablets (12.5 mg total) by mouth daily. 01/12/23   Alver Sorrow, NP  sulfamethoxazole-trimethoprim (BACTRIM DS) 800-160 MG tablet Take 1 tablet by mouth 2 (two) times daily. Patient not taking: Reported on 04/13/2023 02/27/23   Dettinger, Elige Radon, MD    Physical Exam: BP 105/79   Pulse 97   Temp 97.7 F (36.5 C) (Oral)   Resp (!) 21   Ht 6\' 4"  (1.93 m)   Wt 118 kg   SpO2 93%   BMI 31.67 kg/m   General: 57 y.o. year-old male well developed well nourished in no acute distress.  Alert and oriented x3. HEENT: NCAT, EOMI Neck: Supple, trachea medial Cardiovascular: Regular rate and rhythm with no rubs or gallops.  No thyromegaly or JVD noted.  No lower extremity edema. 2/4 pulses in all 4  extremities. Respiratory: Clear to auscultation with no wheezes or rales. Good inspiratory effort. Abdomen: Soft, nontender nondistended with normal bowel sounds x4 quadrants. Muskuloskeletal: No cyanosis, clubbing or edema noted bilaterally Neuro: CN II-XII intact, strength 5/5 x 4, sensation, reflexes intact Skin: No ulcerative lesions noted or rashes Psychiatry: Judgement and insight appear normal. Mood is appropriate for condition and setting          Labs on Admission:  Basic Metabolic Panel: Recent Labs  Lab 04/18/23 2030  NA 136  K 4.3  CL 104  CO2 23  GLUCOSE 182*  BUN 26*  CREATININE 1.25*  CALCIUM 9.3   Liver Function Tests: Recent Labs  Lab 04/18/23 2030  AST 48*  ALT 29  ALKPHOS 82  BILITOT 0.8  PROT 7.3  ALBUMIN 3.5   No results for input(s): "LIPASE", "AMYLASE" in the last 168 hours. No results for input(s): "AMMONIA" in the last 168 hours. CBC: Recent Labs  Lab 04/18/23 2030  WBC 10.0  NEUTROABS 7.2  HGB 15.4  HCT 48.9  MCV 92.4  PLT 268   Cardiac Enzymes: No results for input(s): "CKTOTAL", "CKMB", "CKMBINDEX", "TROPONINI" in the last 168 hours.  BNP (last 3 results) Recent Labs    03/23/23 1145 04/18/23 2030  BNP 880.0* 1,039.0*    ProBNP (last 3 results) No results for input(s): "PROBNP" in the last 8760 hours.  CBG: Recent Labs  Lab 04/18/23 2009  GLUCAP 179*    Radiological Exams on Admission: CT Angio Chest PE W and/or Wo Contrast  Result Date: 04/18/2023 CLINICAL DATA:  Pulmonary embolus suspected with low to intermediate probability. Positive D-dimer. Near syncope, dizziness, and shortness of breath. Nonproductive cough. Acute nonlocalized abdominal pain. EXAM: CT ANGIOGRAPHY CHEST CT ABDOMEN AND PELVIS WITH CONTRAST TECHNIQUE: Multidetector CT imaging of the chest was performed using the standard protocol during bolus administration of intravenous contrast. Multiplanar CT image reconstructions and MIPs were obtained to  evaluate the vascular anatomy. Multidetector CT imaging of the abdomen and pelvis was performed using the standard protocol during bolus administration of intravenous contrast. RADIATION DOSE REDUCTION: This exam was performed according to the departmental dose-optimization program  which includes automated exposure control, adjustment of the mA and/or kV according to patient size and/or use of iterative reconstruction technique. CONTRAST:  OMNIPAQUE IOHEXOL 350 MG/ML SOLN COMPARISON:  CT chest 03/23/2023. FINDINGS: CTA CHEST FINDINGS Cardiovascular: Good opacification of the central and segmental pulmonary arteries. No focal filling defects. No evidence of significant pulmonary embolus. Normal caliber thoracic aorta. Calcification in the coronary arteries. Coronary stents. Mild calcification in the thoracic aorta. Mediastinum/Nodes: No enlarged mediastinal, hilar, or axillary lymph nodes. Thyroid gland, trachea, and esophagus demonstrate no significant findings. Lungs/Pleura: Mild interstitial changes to the lungs may indicate chronic fibrosis or acute edema. No pleural effusions. No pneumothorax. No focal consolidation. Musculoskeletal: No chest wall abnormality. No acute or significant osseous findings. Review of the MIP images confirms the above findings. CT ABDOMEN and PELVIS FINDINGS Hepatobiliary: Gallbladder wall is thickened with pericholecystic edema and stranding. No stones are identified. Changes may represent acute cholecystitis, either acalculous or with non radiopaque stones. No bile duct dilatation. No focal liver lesions. Pancreas: Unremarkable. No pancreatic ductal dilatation or surrounding inflammatory changes. Spleen: Normal in size without focal abnormality. Adrenals/Urinary Tract: Adrenal glands are unremarkable. Kidneys are normal, without renal calculi, focal lesion, or hydronephrosis. Bladder is unremarkable. Stomach/Bowel: Stomach is within normal limits. Appendix appears normal. No  evidence of bowel wall thickening, distention, or inflammatory changes. Vascular/Lymphatic: Aortic atherosclerosis. No enlarged abdominal or pelvic lymph nodes. Reproductive: Prostate is unremarkable. Other: Small amount of free fluid around the liver, likely reactive. No free air. Abdominal wall musculature appears intact. Musculoskeletal: No acute or significant osseous findings. Review of the MIP images confirms the above findings. IMPRESSION: 1. No evidence of significant pulmonary embolus. 2. Mild interstitial changes in the lungs, possibly chronic fibrosis or acute edema. No focal consolidation. 3. Gallbladder wall thickening with pericholecystic edema, likely cholecystitis. No discrete stones are identified. 4. Aortic atherosclerosis. 5. Small amount of free fluid around the liver is likely reactive. Electronically Signed   By: Burman Nieves M.D.   On: 04/18/2023 22:29   CT ABDOMEN PELVIS W CONTRAST  Result Date: 04/18/2023 CLINICAL DATA:  Pulmonary embolus suspected with low to intermediate probability. Positive D-dimer. Near syncope, dizziness, and shortness of breath. Nonproductive cough. Acute nonlocalized abdominal pain. EXAM: CT ANGIOGRAPHY CHEST CT ABDOMEN AND PELVIS WITH CONTRAST TECHNIQUE: Multidetector CT imaging of the chest was performed using the standard protocol during bolus administration of intravenous contrast. Multiplanar CT image reconstructions and MIPs were obtained to evaluate the vascular anatomy. Multidetector CT imaging of the abdomen and pelvis was performed using the standard protocol during bolus administration of intravenous contrast. RADIATION DOSE REDUCTION: This exam was performed according to the departmental dose-optimization program which includes automated exposure control, adjustment of the mA and/or kV according to patient size and/or use of iterative reconstruction technique. CONTRAST:  OMNIPAQUE IOHEXOL 350 MG/ML SOLN COMPARISON:  CT chest 03/23/2023.  FINDINGS: CTA CHEST FINDINGS Cardiovascular: Good opacification of the central and segmental pulmonary arteries. No focal filling defects. No evidence of significant pulmonary embolus. Normal caliber thoracic aorta. Calcification in the coronary arteries. Coronary stents. Mild calcification in the thoracic aorta. Mediastinum/Nodes: No enlarged mediastinal, hilar, or axillary lymph nodes. Thyroid gland, trachea, and esophagus demonstrate no significant findings. Lungs/Pleura: Mild interstitial changes to the lungs may indicate chronic fibrosis or acute edema. No pleural effusions. No pneumothorax. No focal consolidation. Musculoskeletal: No chest wall abnormality. No acute or significant osseous findings. Review of the MIP images confirms the above findings. CT ABDOMEN and PELVIS FINDINGS Hepatobiliary:  Gallbladder wall is thickened with pericholecystic edema and stranding. No stones are identified. Changes may represent acute cholecystitis, either acalculous or with non radiopaque stones. No bile duct dilatation. No focal liver lesions. Pancreas: Unremarkable. No pancreatic ductal dilatation or surrounding inflammatory changes. Spleen: Normal in size without focal abnormality. Adrenals/Urinary Tract: Adrenal glands are unremarkable. Kidneys are normal, without renal calculi, focal lesion, or hydronephrosis. Bladder is unremarkable. Stomach/Bowel: Stomach is within normal limits. Appendix appears normal. No evidence of bowel wall thickening, distention, or inflammatory changes. Vascular/Lymphatic: Aortic atherosclerosis. No enlarged abdominal or pelvic lymph nodes. Reproductive: Prostate is unremarkable. Other: Small amount of free fluid around the liver, likely reactive. No free air. Abdominal wall musculature appears intact. Musculoskeletal: No acute or significant osseous findings. Review of the MIP images confirms the above findings. IMPRESSION: 1. No evidence of significant pulmonary embolus. 2. Mild  interstitial changes in the lungs, possibly chronic fibrosis or acute edema. No focal consolidation. 3. Gallbladder wall thickening with pericholecystic edema, likely cholecystitis. No discrete stones are identified. 4. Aortic atherosclerosis. 5. Small amount of free fluid around the liver is likely reactive. Electronically Signed   By: Burman Nieves M.D.   On: 04/18/2023 22:29   DG Chest Portable 1 View  Result Date: 04/18/2023 CLINICAL DATA:  Near-syncope.  Shortness of breath EXAM: PORTABLE CHEST 1 VIEW COMPARISON:  X-ray and CT angiogram 03/23/2023. FINDINGS: Stable enlarged cardiopericardial silhouette with some vascular congestion. No consolidation, pneumothorax or effusion. No edema. Overlapping cardiac leads. The right inferior costophrenic angle is clipped off the edge of the film. IMPRESSION: No significant interval change. Electronically Signed   By: Karen Kays M.D.   On: 04/18/2023 20:52    EKG: I independently viewed the EKG done and my findings are as followed: Normal sinus rhythm at a rate of 94 bpm  Assessment/Plan Present on Admission:  Acute cholecystitis  Essential hypertension  Mixed hyperlipidemia  GERD (gastroesophageal reflux disease)  Principal Problem:   Acute cholecystitis Active Problems:   Essential hypertension   Mixed hyperlipidemia   GERD (gastroesophageal reflux disease)   CAD S/P PCI   Abdominal pain   Elevated brain natriuretic peptide (BNP) level   Elevated troponin   Elevated d-dimer   Type 2 diabetes mellitus with hyperglycemia (HCC)   Acute cholecystitis Abdominal pain in the setting of above Patient will be admitted to MedSurg unit Continue IV LR at 100 mLs/Hr Patient was started on IV cefepime and metronidazole, we shall continue same at this time  Continue IV Dilaudid 0.5 mg q.3h p.r.n. for moderate to severe pain Continue IV Zofran p.r.n. RUQ ultrasound will be done in the morning Patient will be placed n.p.o. in anticipation for  possible surgical intervention in the morning General Surgery was consulted to follow up with patient in the morning per EDP  Elevated BNP BNP 1039; this is chronically elevated CT negative for chest was suggestive of chronic fibrosis or acute edema Consider diuretic when BP improves (BP currently soft)  Elevated D-dimer CT angiography of chest ruled out pulmonary embolism  Elevated troponin-flattened Troponin 51 > 47; patient denies chest pain  T2DM with hyperglycemia Globin A1c on 02/11/2023 was 7.4 Continue ISS and hypoglycemia protocol  Essential hypertension BP meds will be held at this time due to soft BP Metoprolol will be held at this time  Mixed hyperlipidemia Consider starting patient on statin and fenofibrate when he resumes oral intake  GERD Continue Protonix  CAD Continue aspirin Toprol-XL will be held at this time  DVT prophylaxis: SCDs (consider starting patient on chemoprophylaxis if no indication for surgical intervention)  Code Status: Full code  Family Communication: None at bedside  Consults: General Surgery  Severity of Illness: The appropriate patient status for this patient is INPATIENT. Inpatient status is judged to be reasonable and necessary in order to provide the required intensity of service to ensure the patient's safety. The patient's presenting symptoms, physical exam findings, and initial radiographic and laboratory data in the context of their chronic comorbidities is felt to place them at high risk for further clinical deterioration. Furthermore, it is not anticipated that the patient will be medically stable for discharge from the hospital within 2 midnights of admission.   * I certify that at the point of admission it is my clinical judgment that the patient will require inpatient hospital care spanning beyond 2 midnights from the point of admission due to high intensity of service, high risk for further deterioration and high frequency  of surveillance required.*  Author: Frankey Shown, DO 04/19/2023 3:22 AM  For on call review [email or web address].

## 2023-04-18 NOTE — ED Notes (Signed)
Pt lying in bed with family at bedside. Complaining of abdominal pain. Tender upon palpitation. A&O x4. Denies chest pain. Ulcers seen bilaterally on lower extremities; open none actively bleeding.

## 2023-04-18 NOTE — ED Provider Notes (Signed)
[place] EMERGENCY DEPARTMENT AT [facility] Provider Note   CSN: [id number] Arrival date & time: 04/18/23  2000     History  Chief Complaint  Patient presents with   Near Syncope    Evan [name] is a 57 y.o. male with PMH as listed below who presents with lightheadedness, abdominal pain, syncope, shortness of breath.  Abdominal pain is lower abdomen, BL LQs, improved by lying still and worsened by movement. Pt reports being prescribed new metoprolol succinate 25 mg and today was the first day he took it. Symptoms started about 45 minutes after that. Has SOB at rest as well as dyspnea with exertion. Also has orthopnea, nonproductive cough for a few days worse at night. Increased swelling in BL feet as well w/ feet pain. Was hospitalized a few months ago for PNA and had low BP, was taken off several of his BP meds at that time.    Past Medical History:  Diagnosis Date   Arthritis    Atrial fibrillation (HCC)    CHF (congestive heart failure) (HCC)    Diabetes mellitus without complication (HCC)    Gout    Heart attack (HCC) 10/2018   Hyperlipidemia    Hypertension    Morbid obesity (HCC) 12/22/2016   Sleep apnea    had test twice unable to complete       Home Medications Prior to Admission medications   Medication Sig Start Date End Date Taking? Authorizing Provider  empagliflozin (JARDIANCE) 25 MG TABS tablet Take 1 tablet (25 mg total) by mouth daily. 08/07/22  Yes Dettinger, Elige Radon, MD  metoprolol succinate (TOPROL XL) 25 MG 24 hr tablet Take 1 tablet (25 mg total) by mouth daily. 04/13/23  Yes Reather Littler D, NP  aspirin EC 81 MG tablet Take 81 mg by mouth daily.    [provider]  atorvastatin (LIPITOR) 80 MG tablet Take 1 tablet (80 mg total) by mouth daily. Patient taking differently: Take 80 mg by mouth daily at 6 PM. 08/07/22   Dettinger, Elige Radon, MD  BD PEN NEEDLE NANO 2ND GEN 32G X 4 MM MISC Use daily with insulin Dx E11.9, Z79.4 06/22/20    Dettinger, Elige Radon, MD  Blood Glucose Monitoring Suppl (ACCU-CHEK AVIVA PLUS) w/Device KIT 1 each by Does not apply route 4 (four) times daily. 06/17/21   Dettinger, Elige Radon, MD  Blood Glucose Monitoring Suppl (ACCU-CHEK AVIVA) device Test BS BID Dx E11.69 05/24/20   Dettinger, Elige Radon, MD  clopidogrel (PLAVIX) 75 MG tablet Take 1 tablet (75 mg total) by mouth daily. Patient not taking: Reported on 04/13/2023 01/12/23   Alver Sorrow, NP  famotidine (PEPCID) 20 MG tablet Take 1 tablet (20 mg total) by mouth 2 (two) times daily. Patient not taking: Reported on 04/13/2023 08/07/22   Dettinger, Elige Radon, MD  fenofibrate (TRICOR) 145 MG tablet Take 1 tablet (145 mg total) by mouth daily. 08/07/22   Dettinger, Elige Radon, MD  fluticasone (FLONASE) 50 MCG/ACT nasal spray Place 1 spray into both nostrils daily. Patient not taking: Reported on 04/13/2023 08/21/21   Dettinger, Elige Radon, MD  furosemide (LASIX) 20 MG tablet Take 1 tablet (20 mg total) by mouth daily. 08/07/22   Dettinger, Elige Radon, MD  furosemide (LASIX) 20 MG tablet Take 2 tablets (40 mg total) by mouth daily for 5 days, THEN 1 tablet (20 mg total) daily. Patient not taking: Reported on 04/13/2023 03/23/23 04/27/23  Terald Sleeper, MD  glucose  blood (ACCU-CHEK GUIDE) test strip Check BS in the morning and at bedtime Dx E11.8 07/01/21   Dettinger, Elige Radon, MD  losartan (COZAAR) 25 MG tablet Take 1 tablet (25 mg total) by mouth daily. Patient taking differently: Take 25 mg by mouth at bedtime. 08/07/22   Dettinger, Elige Radon, MD  Omega-3 Fatty Acids (FISH OIL) 1000 MG CAPS Take 2,000 mg by mouth daily.    [provider]  Semaglutide,0.25 or 0.5MG /DOS, 2 MG/3ML SOPN Inject 0.5 mg into the skin once a week. 01/22/23   Dettinger, Elige Radon, MD  sildenafil (REVATIO) 20 MG tablet Take 1-5 tablets (20-100 mg total) by mouth daily as needed. 11/15/21   Dettinger, Elige Radon, MD  spironolactone (ALDACTONE) 25 MG tablet Take 0.5 tablets (12.5 mg total)  by mouth daily. 01/12/23   Alver Sorrow, NP  sulfamethoxazole-trimethoprim (BACTRIM DS) 800-160 MG tablet Take 1 tablet by mouth 2 (two) times daily. Patient not taking: Reported on 04/13/2023 02/27/23   Dettinger, Elige Radon, MD      Allergies    Patient has no known allergies.    Review of Systems   Review of Systems A 10 point review of systems was performed and is negative unless otherwise reported in HPI.  Physical Exam Updated Vital Signs Ht 6\' 4"  (1.93 m)   Wt 118 kg   BMI 31.67 kg/m  Physical Exam General: Normal appearing male, lying in bed.  HEENT: PERRLA, Sclera anicteric, MMM, trachea midline.  Cardiology: RRR, no murmurs/rubs/gallops. BL radial and DP pulses equal bilaterally.  Resp: Normal respiratory rate and effort. CTAB, no wheezes, rhonchi, crackles.  Abd: Soft, non-tender, non-distended. No rebound tenderness or guarding.  GU: Deferred. MSK: No peripheral edema or signs of trauma. Extremities without deformity or TTP. No cyanosis or clubbing. Skin: warm, dry. No rashes or lesions. Back: No CVA tenderness Neuro: A&Ox4, CNs II-XII grossly intact. MAEs. Sensation grossly intact.  Psych: Normal mood and affect.   ED Results / Procedures / Treatments   Labs (all labs ordered are listed, but only abnormal results are displayed) Labs Reviewed  COMPREHENSIVE METABOLIC PANEL - Abnormal; Notable for the following components:   Glucose, Bld 182 (*)    BUN 26 (*)    Creatinine, Ser 1.25 (*)    AST 48 (*)    All other components within normal limits  D-DIMER, QUANTITATIVE - Abnormal; Notable for the following components:   D-Dimer, Quant 1.60 (*)    All other components within normal limits  BRAIN NATRIURETIC PEPTIDE - Abnormal; Notable for the following components:   B Natriuretic Peptide 1,039.0 (*)    All other components within normal limits  URINALYSIS, ROUTINE W REFLEX MICROSCOPIC - Abnormal; Notable for the following components:   Specific Gravity, Urine  >1.046 (*)    Glucose, UA >=500 (*)    Hgb urine dipstick SMALL (*)    Ketones, ur 5 (*)    Protein, ur 30 (*)    All other components within normal limits  TROPONIN I (HIGH SENSITIVITY) - Abnormal; Notable for the following components:   Troponin I (High Sensitivity) 51 (*)    All other components within normal limits  TROPONIN I (HIGH SENSITIVITY) - Abnormal; Notable for the following components:   Troponin I (High Sensitivity) 47 (*)    All other components within normal limits  CBC WITH DIFFERENTIAL/PLATELET  HIV ANTIBODY (ROUTINE TESTING W REFLEX)  CBC  MAGNESIUM  PHOSPHORUS    EKG EKG Interpretation Date/Time:  Saturday April 18 2023 20:12:01 EST Ventricular Rate:  94 PR Interval:  162 QRS Duration:  91 QT Interval:  368 QTC Calculation: 461 R Axis:   224  Text Interpretation: Sinus rhythm Abnormal lateral Q waves Anterior infarct, old Confirmed by Vivi Barrack [phone]) on 04/18/2023 9:08:36 PM  Radiology CXR: No significant interval change  CT PE and CT abd pelvis: 1. No evidence of significant pulmonary embolus. 2. Mild interstitial changes in the lungs, possibly chronic fibrosis or acute edema. No focal consolidation. 3. Gallbladder wall thickening with pericholecystic edema, likely cholecystitis. No discrete stones are identified. 4. Aortic atherosclerosis. 5. Small amount of free fluid around the liver is likely reactive.   Procedures Procedures    Medications Ordered in ED Medications  iohexol (OMNIPAQUE) 350 MG/ML injection 100 mL (100 mLs Intravenous Contrast Given 04/18/23 2200)  fentaNYL (SUBLIMAZE) injection 50 mcg (50 mcg Intravenous Given 04/18/23 2258)  ondansetron (ZOFRAN) injection 4 mg (4 mg Intravenous Given 04/18/23 2258)    ED Course/ Medical Decision Making/ A&P                          Medical Decision Making Amount and/or Complexity of Data Reviewed Labs: ordered. Decision-making details documented in ED Course. Radiology:  ordered. Decision-making details documented in ED Course.  Risk Prescription drug management. Decision regarding hospitalization.    This patient presents to the ED for concern of abdominal pain, lightheadedness;, this involves an extensive number of treatment options, and is a complaint that carries with it a high risk of complications and morbidity.  I considered the following differential and admission for this acute, potentially life threatening condition.   MDM:    DDX for lightheadedness, pre-syncope, and abdominal pain includes but is not limited to:  Consider orthostatic hypotension given that patient reports he took metoprolol for the first time today. Possible medication side effect, though this does not explain the abdominal pain. Abdominal exam without peritoneal signs. No evidence of acute abdomen at this time.Consider intraabdominal infection such as acute hepatobiliary disease (including acute cholecystitis or cholangitis), PUD (including gastric perforation), acute infectious processes (pneumonia, hepatitis, pyelonephritis), acute appendicitis, vascular catastrophe, bowel obstruction,  diverticulitis.  Consider anemia and electrolyte abnormalities as possible etiologies. Consider arrhythmias and ACS, although without associated symptoms, less likely. Consider hemorrhage vs CVA, although neuro intact now and no associated other symptoms. Given history, exam and workup, also consider HF w/ report of orthopnea/DOE, ACS (flat troponin, no anginal pain), aortic dissection (no chest pain), malignant arrhythmia on ekg, or GI bleed (stable hgb). Consider PE given w/ cough, lightheadedness, DOE. He has no evidence of DVT, no recent surgery/immobilization; will get d-dimer and reassess.  Clinical Course as of 04/25/23 1309  Sat Apr 18, 2023  2107 CBC with Differential wnl [HN]  2108 D-Dimer, Quant(!): 1.60 Postiive dimer, will get CT PE [HN]  2108 DG Chest Portable 1 View No significant  interval change. [HN]  2147 B Natriuretic Peptide(!): 1,039.0 Increased from 3 weeks ago [HN]  2147 Troponin I (High Sensitivity)(!): 51 Mildly elevated troponin, will trend [HN]  2240 CT ABDOMEN PELVIS W CONTRAST 1. No evidence of significant pulmonary embolus. 2. Mild interstitial changes in the lungs, possibly chronic fibrosis or acute edema. No focal consolidation. 3. Gallbladder wall thickening with pericholecystic edema, likely cholecystitis. No discrete stones are identified. 4. Aortic atherosclerosis. 5. Small amount of free fluid around the liver is likely reactive.   [HN]  2241 Troponin I (High Sensitivity)(!): 47  Flat troponin [HN]  2241 Given IV cefepime/flagyl for acute cholecystitis, consulted to surgery. [HN]  2248 D/w Dr. Robyne Peers with general surgery who recommends admission to hospitalist, [place] in the AM, and cards c/s prior to decision about surgery, as his cardiovascular complications make him a poor surgical candidate. [HN]    Clinical Course User Index [HN] Loetta Rough, MD    Labs: I Ordered, and personally interpreted labs.  The pertinent results include:  those listed above  Imaging Studies ordered: I ordered imaging studies including CXR, CTPE, CT abd pelvis I independently visualized and interpreted imaging. I agree with the radiologist interpretation  Additional history obtained from chart review.    Cardiac Monitoring: The patient was maintained on a cardiac monitor.  I personally viewed and interpreted the cardiac monitored which showed an underlying rhythm of: NSR  Reevaluation: After the interventions noted above, I reevaluated the patient and found that they have :improved  Social Determinants of Health: Lives independently  Disposition:  Admitted to hospitalist with surgery following  Co morbidities that complicate the patient evaluation  Past Medical History:  Diagnosis Date   Arthritis    Atrial fibrillation (HCC)    CHF  (congestive heart failure) (HCC)    Diabetes mellitus without complication (HCC)    Gout    Heart attack (HCC) 10/2018   Hyperlipidemia    Hypertension    Morbid obesity (HCC) 12/22/2016   Sleep apnea    had test twice unable to complete     Medicines No orders of the defined types were placed in this encounter.   I have reviewed the patients home medicines and have made adjustments as needed  Problem List / ED Course: Problem List Items Addressed This Visit       Other   History of non-ST elevation myocardial infarction (NSTEMI)   Relevant Medications   spironolactone (ALDACTONE) 25 MG tablet   Other Visit Diagnoses     Cholecystitis, acute    -  Primary   Near syncope       Relevant Medications   spironolactone (ALDACTONE) 25 MG tablet   aspirin EC 81 MG tablet   furosemide (LASIX) 20 MG tablet   atorvastatin (LIPITOR) 80 MG tablet   Dizziness       Coronary artery disease of native artery of native heart with stable angina pectoris (HCC)       Relevant Medications   fentaNYL (SUBLIMAZE) injection 50 mcg (Completed)   spironolactone (ALDACTONE) 25 MG tablet   acetaminophen (TYLENOL) 325 MG tablet   aspirin EC 81 MG tablet   furosemide (LASIX) 20 MG tablet   atorvastatin (LIPITOR) 80 MG tablet   Ischemic cardiomyopathy       Relevant Medications   spironolactone (ALDACTONE) 25 MG tablet   aspirin EC 81 MG tablet   furosemide (LASIX) 20 MG tablet   atorvastatin (LIPITOR) 80 MG tablet   Hyperlipidemia associated with type 2 diabetes mellitus (HCC)       Relevant Medications   spironolactone (ALDACTONE) 25 MG tablet   aspirin EC 81 MG tablet   furosemide (LASIX) 20 MG tablet   atorvastatin (LIPITOR) 80 MG tablet                   This note was created using dictation software, which may contain spelling or grammatical errors.    Loetta Rough, MD 04/25/23 [phone]

## 2023-04-19 ENCOUNTER — Encounter (HOSPITAL_COMMUNITY): Payer: Self-pay | Admitting: Internal Medicine

## 2023-04-19 ENCOUNTER — Inpatient Hospital Stay (HOSPITAL_COMMUNITY): Payer: Medicaid Other

## 2023-04-19 DIAGNOSIS — K828 Other specified diseases of gallbladder: Secondary | ICD-10-CM | POA: Diagnosis not present

## 2023-04-19 DIAGNOSIS — K810 Acute cholecystitis: Secondary | ICD-10-CM | POA: Diagnosis not present

## 2023-04-19 DIAGNOSIS — K838 Other specified diseases of biliary tract: Secondary | ICD-10-CM | POA: Diagnosis not present

## 2023-04-19 DIAGNOSIS — E1165 Type 2 diabetes mellitus with hyperglycemia: Secondary | ICD-10-CM | POA: Insufficient documentation

## 2023-04-19 DIAGNOSIS — R7989 Other specified abnormal findings of blood chemistry: Secondary | ICD-10-CM | POA: Insufficient documentation

## 2023-04-19 DIAGNOSIS — R109 Unspecified abdominal pain: Secondary | ICD-10-CM | POA: Insufficient documentation

## 2023-04-19 LAB — URINALYSIS, ROUTINE W REFLEX MICROSCOPIC
Bacteria, UA: NONE SEEN
Bilirubin Urine: NEGATIVE
Glucose, UA: >=500 mg/dL — AB
Ketones, ur: 5 mg/dL — AB
Leukocytes,Ua: NEGATIVE
Nitrite: NEGATIVE
Protein, ur: 30 mg/dL — AB
Specific Gravity, Urine: >1.046 — ABNORMAL HIGH (ref 1.005–1.030)
pH: 5 (ref 5.0–8.0)

## 2023-04-19 LAB — CBG MONITORING, ED
Glucose-Capillary: 114 mg/dL — ABNORMAL HIGH (ref 70–99)
Glucose-Capillary: 133 mg/dL — ABNORMAL HIGH (ref 70–99)
Glucose-Capillary: 120 mg/dL — ABNORMAL HIGH (ref 70–99)

## 2023-04-19 LAB — CBC
HCT: 46.3 % (ref 39.0–52.0)
Hemoglobin: 14.8 g/dL (ref 13.0–17.0)
MCH: 29.2 pg (ref 26.0–34.0)
MCHC: 32 g/dL (ref 30.0–36.0)
MCV: 91.3 fL (ref 80.0–100.0)
Platelets: 241 10*3/uL (ref 150–400)
RBC: 5.07 MIL/uL (ref 4.22–5.81)
RDW: 14.2 % (ref 11.5–15.5)
WBC: 10.4 10*3/uL (ref 4.0–10.5)
nRBC: 0 % (ref 0.0–0.2)

## 2023-04-19 LAB — COMPREHENSIVE METABOLIC PANEL
ALT: 42 U/L (ref 0–44)
AST: 59 U/L — ABNORMAL HIGH (ref 15–41)
Albumin: 3.3 g/dL — ABNORMAL LOW (ref 3.5–5.0)
Alkaline Phosphatase: 76 U/L (ref 38–126)
Anion gap: 11 (ref 5–15)
BUN: 24 mg/dL — ABNORMAL HIGH (ref 6–20)
CO2: 18 mmol/L — ABNORMAL LOW (ref 22–32)
Calcium: 8.8 mg/dL — ABNORMAL LOW (ref 8.9–10.3)
Chloride: 106 mmol/L (ref 98–111)
Creatinine, Ser: 1 mg/dL (ref 0.61–1.24)
GFR, Estimated: >60 mL/min (ref >60)
Glucose, Bld: 132 mg/dL — ABNORMAL HIGH (ref 70–99)
Potassium: 4 mmol/L (ref 3.5–5.1)
Sodium: 135 mmol/L (ref 135–145)
Total Bilirubin: 1.3 mg/dL — ABNORMAL HIGH (ref <1.2)
Total Protein: 6.9 g/dL (ref 6.5–8.1)

## 2023-04-19 LAB — HIV ANTIBODY (ROUTINE TESTING W REFLEX): HIV Screen 4th Generation wRfx: NONREACTIVE

## 2023-04-19 LAB — PHOSPHORUS: Phosphorus: 4 mg/dL (ref 2.5–4.6)

## 2023-04-19 LAB — HEMOGLOBIN A1C
Hgb A1c MFr Bld: 7.3 % — ABNORMAL HIGH (ref 4.8–5.6)
Mean Plasma Glucose: 162.81 mg/dL

## 2023-04-19 LAB — GLUCOSE, CAPILLARY
Glucose-Capillary: 139 mg/dL — ABNORMAL HIGH (ref 70–99)
Glucose-Capillary: 126 mg/dL — ABNORMAL HIGH (ref 70–99)
Glucose-Capillary: 194 mg/dL — ABNORMAL HIGH (ref 70–99)

## 2023-04-19 LAB — MRSA NEXT GEN BY PCR, NASAL: MRSA by PCR Next Gen: DETECTED — AB

## 2023-04-19 LAB — MAGNESIUM: Magnesium: 2.1 mg/dL (ref 1.7–2.4)

## 2023-04-19 MED ORDER — CHLORHEXIDINE GLUCONATE CLOTH 2 % EX PADS
6.0000 | MEDICATED_PAD | Freq: Every day | CUTANEOUS | Status: DC
  Administered 2023-04-19 – 2023-04-21 (×3): 6 via TOPICAL

## 2023-04-19 MED ORDER — LACTATED RINGERS IV SOLN: INTRAVENOUS | Status: AC

## 2023-04-19 MED ORDER — ACETAMINOPHEN 650 MG RE SUPP: 650.0000 mg | Freq: Four times a day (QID) | RECTAL | Status: DC | PRN

## 2023-04-19 MED ORDER — ONDANSETRON HCL 4 MG PO TABS: 4.0000 mg | ORAL_TABLET | Freq: Four times a day (QID) | ORAL | Status: DC | PRN

## 2023-04-19 MED ORDER — PANTOPRAZOLE SODIUM 40 MG IV SOLR
40.0000 mg | INTRAVENOUS | Status: DC
  Administered 2023-04-19 – 2023-04-21 (×3): 40 mg via INTRAVENOUS
  Filled 2023-04-19 (×3): qty 10

## 2023-04-19 MED ORDER — INSULIN ASPART 100 UNIT/ML IJ SOLN
0.0000 [IU] | INTRAMUSCULAR | Status: DC
  Administered 2023-04-19: 3 [IU] via SUBCUTANEOUS
  Administered 2023-04-19 – 2023-04-20 (×3): 2 [IU] via SUBCUTANEOUS
  Administered 2023-04-20: 3 [IU] via SUBCUTANEOUS
  Administered 2023-04-20: 2 [IU] via SUBCUTANEOUS
  Administered 2023-04-20: 3 [IU] via SUBCUTANEOUS
  Administered 2023-04-20: 5 [IU] via SUBCUTANEOUS
  Administered 2023-04-20 – 2023-04-21 (×3): 2 [IU] via SUBCUTANEOUS

## 2023-04-19 MED ORDER — SPIRONOLACTONE 25 MG PO TABS
25.0000 mg | ORAL_TABLET | Freq: Every day | ORAL | Status: DC
  Administered 2023-04-19 – 2023-04-21 (×3): 25 mg via ORAL
  Filled 2023-04-19 (×3): qty 1

## 2023-04-19 MED ORDER — FUROSEMIDE 20 MG PO TABS
20.0000 mg | ORAL_TABLET | Freq: Two times a day (BID) | ORAL | Status: DC
  Administered 2023-04-19 – 2023-04-21 (×5): 20 mg via ORAL
  Filled 2023-04-19 (×5): qty 1

## 2023-04-19 MED ORDER — ASPIRIN 81 MG PO TBEC
81.0000 mg | DELAYED_RELEASE_TABLET | Freq: Every day | ORAL | Status: DC
  Administered 2023-04-19 – 2023-04-21 (×3): 81 mg via ORAL
  Filled 2023-04-19 (×3): qty 1

## 2023-04-19 MED ORDER — ACETAMINOPHEN 325 MG PO TABS
650.0000 mg | ORAL_TABLET | Freq: Four times a day (QID) | ORAL | Status: DC | PRN
  Administered 2023-04-20: 650 mg via ORAL
  Filled 2023-04-19: qty 2

## 2023-04-19 MED ORDER — ONDANSETRON HCL 4 MG/2ML IJ SOLN: 4.0000 mg | Freq: Four times a day (QID) | INTRAMUSCULAR | Status: DC | PRN

## 2023-04-19 MED ORDER — SODIUM CHLORIDE 0.9 % IV SOLN
2.0000 g | Freq: Three times a day (TID) | INTRAVENOUS | Status: DC
  Administered 2023-04-19 – 2023-04-21 (×7): 2 g via INTRAVENOUS
  Filled 2023-04-19 (×7): qty 12.5

## 2023-04-19 MED ORDER — HYDROMORPHONE HCL 1 MG/ML IJ SOLN
0.5000 mg | INTRAMUSCULAR | Status: DC | PRN
  Administered 2023-04-20: 0.5 mg via INTRAVENOUS
  Filled 2023-04-19: qty 0.5

## 2023-04-19 NOTE — Plan of Care (Signed)

## 2023-04-19 NOTE — Consult Note (Signed)
[facility] Consult  Reason for Consult: Concern for acute cholecystitis Referring Physician: Dr. Jearld Fenton  Chief Complaint   Near Syncope     HPI: Evan [name] is a 57 y.o. male who presents to the hospital with lightheadedness, abdominal pain, syncope, and shortness of breath.  He was started on metoprolol this past week, and first took the medication yesterday, and he believes that is what initiated all of the symptoms.  He stated that he had some sharp abdominal pain that was worse with movement yesterday, but this is subsequently resolved.  His past medical history significant for atrial fibrillation, CHF, diabetes, hyperlipidemia, hypertension, and sleep apnea.  Per patient's EMR, he was on Plavix, though the patient states that he was taken off of this medication but he is unsure when.  He denies ever having surgery on his abdomen.  He confirms he has a history of heart attacks in the past with stent placement.  He was also recently admitted to the hospital with pneumonia, and he believes that his heart was evaluated at that time and his functionality was at 10%.  Per cardiology's note, he underwent echo in May 2024 where his EF was 35%.  He was also treated for a mild CHF exacerbation in early November when his BNP was elevated to 880.  Upon evaluation in the ED, he was noted to be hemodynamically stable.  He has no leukocytosis and LFTs are within normal limits, excluding a mildly elevated AST.  He did have mild bump in his high-sensitivity troponins to 51 and 47, and BNP was elevated at 1039.  He underwent a CTA of the chest and a CT of the chest abdomen and pelvis, which demonstrated no significant PE, mild interstitial changes in the lungs, gallbladder wall thickening with edema without stones noted, possible cholecystitis and a small amount of reactive free fluid around the liver.  Today he subsequently underwent an abdominal ultrasound which demonstrated normal gallbladder  wall thickening with some pericholecystic fluid and sludge but no gallstones or Murphy sign, findings equivocal for cholecystitis.  Past Medical History:  Diagnosis Date   Arthritis    Atrial fibrillation (HCC)    CHF (congestive heart failure) (HCC)    Diabetes mellitus without complication (HCC)    Gout    Heart attack (HCC) 10/2018   Hyperlipidemia    Hypertension    Morbid obesity (HCC) 12/22/2016   Sleep apnea    had test twice unable to complete    Past Surgical History:  Procedure Laterality Date   ANKLE ARTHROSCOPY Right    COLONOSCOPY WITH PROPOFOL N/A 09/18/2020   Procedure: COLONOSCOPY WITH PROPOFOL;  Surgeon: Sherrilyn Rist, MD;  Location: [facility];  Service: Gastroenterology;  Laterality: N/A;  09-18-2020   CORONARY STENT INTERVENTION N/A 11/24/2018   Procedure: CORONARY STENT INTERVENTION;  Surgeon: Iran Ouch, MD;  Location: [facility];  Service: Cardiovascular;  Laterality: N/A;  prox and mid LAD   CORONARY STENT INTERVENTION N/A 11/26/2018   Procedure: CORONARY STENT INTERVENTION;  Surgeon: Iran Ouch, MD;  Location: [facility] CV [facility];  Service: Cardiovascular;  Laterality: N/A;   CYST REMOVAL HAND     KNEE ARTHROSCOPY Left    LEFT HEART CATH AND CORONARY ANGIOGRAPHY N/A 11/24/2018   Procedure: LEFT HEART CATH AND CORONARY ANGIOGRAPHY;  Surgeon: Iran Ouch, MD;  Location: [facility] CV [facility];  Service: Cardiovascular;  Laterality: N/A;   POLYPECTOMY  09/18/2020   Procedure: POLYPECTOMY;  Surgeon: Sherrilyn Rist, MD;  Location: [facility];  Service: Gastroenterology;;    Family History  Problem Relation Age of Onset   Diabetes Mother    Stroke Mother    Early death Father    Heart attack Father        died from heart attack at the age of 67   Alcohol abuse Father    Heart disease Father    Breast cancer Sister    Hypertension Brother    Heart attack Maternal Grandfather        died from heart attack at the age of 53   Early  death Paternal Grandfather    Heart attack Paternal Grandfather    Colon cancer Neg Hx    Esophageal cancer Neg Hx    Rectal cancer Neg Hx    Stomach cancer Neg Hx     Social History   Tobacco Use   Smoking status: Never   Smokeless tobacco: Never  Vaping Use   Vaping status: Never Used  Substance Use Topics   Alcohol use: Yes    Alcohol/week: 12.0 standard drinks of alcohol    Types: 12 Cans of beer per week    Comment: 5 beers a week   Drug use: No    Medications: I have reviewed the patient's current medications.  No Known Allergies   ROS:  Pertinent items are noted in HPI.  Blood pressure (!) 140/95, pulse 98, temperature 98.4 F (36.9 C), temperature source Oral, resp. rate 20, height 6\' 5"  (1.956 m), weight 118.2 kg, SpO2 100%. Physical Exam Vitals reviewed.  Constitutional:      Appearance: Normal appearance.  HENT:     Head: Normocephalic and atraumatic.  Eyes:     Extraocular Movements: Extraocular movements intact.     Pupils: Pupils are equal, round, and reactive to light.  Cardiovascular:     Rate and Rhythm: Normal rate and regular rhythm.  Pulmonary:     Effort: Pulmonary effort is normal.     Breath sounds: Normal breath sounds.  Abdominal:     Comments: Abdomen soft, nondistended, no percussion tenderness, nontender to palpation, mild fullness in the right upper quadrant; no rigidity, guarding, rebound tenderness; negative Murphy sign  Musculoskeletal:        General: Normal range of motion.     Cervical back: Normal range of motion.  Skin:    General: Skin is warm and dry.  Neurological:     General: No focal deficit present.     Mental Status: He is alert and oriented to person, place, and time.  Psychiatric:        Mood and Affect: Mood normal.        Behavior: Behavior normal.     Results: Results for orders placed or performed during the hospital encounter of 04/18/23 (from the past 48 hour(s))  CBG monitoring, ED     Status:  Abnormal   Collection Time: 04/18/23  8:09 PM  Result Value Ref Range   Glucose-Capillary 179 (H) 70 - 99 mg/dL    Comment: Glucose reference range applies only to samples taken after fasting for at least 8 hours.  CBC with Differential     Status: None   Collection Time: 04/18/23  8:30 PM  Result Value Ref Range   WBC 10.0 4.0 - 10.5 K/uL   RBC 5.29 4.22 - 5.81 MIL/uL   Hemoglobin 15.4 13.0 - 17.0 g/dL   HCT 40.1 02.7 - 25.3 %  MCV 92.4 80.0 - 100.0 fL   MCH 29.1 26.0 - 34.0 pg   MCHC 31.5 30.0 - 36.0 g/dL   RDW 40.9 81.1 - 91.4 %   Platelets 268 150 - 400 K/uL   nRBC 0.0 0.0 - 0.2 %   Neutrophils Relative % 71 %   Neutro Abs 7.2 1.7 - 7.7 K/uL   Lymphocytes Relative 19 %   Lymphs Abs 1.9 0.7 - 4.0 K/uL   Monocytes Relative 8 %   Monocytes Absolute 0.8 0.1 - 1.0 K/uL   Eosinophils Relative 1 %   Eosinophils Absolute 0.1 0.0 - 0.5 K/uL   Basophils Relative 1 %   Basophils Absolute 0.1 0.0 - 0.1 K/uL   Immature Granulocytes 0 %   Abs Immature Granulocytes 0.03 0.00 - 0.07 K/uL    Comment: Performed at [facility], [address] [place], [place] [address]  Comprehensive metabolic panel     Status: Abnormal   Collection Time: 04/18/23  8:30 PM  Result Value Ref Range   Sodium 136 135 - 145 mmol/L   Potassium 4.3 3.5 - 5.1 mmol/L   Chloride 104 98 - 111 mmol/L   CO2 23 22 - 32 mmol/L   Glucose, Bld 182 (H) 70 - 99 mg/dL    Comment: Glucose reference range applies only to samples taken after fasting for at least 8 hours.   BUN 26 (H) 6 - 20 mg/dL   Creatinine, Ser 6.21 (H) 0.61 - 1.24 mg/dL   Calcium 9.3 8.9 - 30.8 mg/dL   Total Protein 7.3 6.5 - 8.1 g/dL   Albumin 3.5 3.5 - 5.0 g/dL   AST 48 (H) 15 - 41 U/L   ALT 29 0 - 44 U/L   Alkaline Phosphatase 82 38 - 126 U/L   Total Bilirubin 0.8 <1.2 mg/dL   GFR, Estimated >65 >78 mL/min    Comment: (NOTE) Calculated using the CKD-EPI Creatinine Equation (2021)    Anion gap 9 5 - 15    Comment: Performed at [facility], [address], [place], [place] [address]  D-dimer, quantitative     Status: Abnormal   Collection Time: 04/18/23  8:30 PM  Result Value Ref Range   D-Dimer, Quant 1.60 (H) 0.00 - 0.50 ug/mL-FEU    Comment: (NOTE) At the manufacturer cut-off value of 0.5 g/mL FEU, this assay has a negative predictive value of 95-100%.This assay is intended for use in conjunction with a clinical pretest probability (PTP) assessment model to exclude pulmonary embolism (PE) and deep venous thrombosis (DVT) in outpatients suspected of PE or DVT. Results should be correlated with clinical presentation. Performed at [facility], [address], [place], [place] [address]   Troponin I (High Sensitivity)     Status: Abnormal   Collection Time: 04/18/23  8:30 PM  Result Value Ref Range   Troponin I (High Sensitivity) 51 (H) <18 ng/L    Comment: (NOTE) Elevated high sensitivity troponin I (hsTnI) values and significant  changes across serial measurements may suggest ACS but many other  chronic and acute conditions are known to elevate hsTnI results.  Refer to the "Links" section for chest pain algorithms and additional  guidance. Performed at [facility], [address] [place], [place] [address]   Brain natriuretic peptide     Status: Abnormal   Collection Time: 04/18/23  8:30 PM  Result Value Ref Range   B Natriuretic Peptide 1,039.0 (H) 0.0 - 100.0 pg/mL    Comment: Performed at  [facility], [address], [place], [place] [address]  Troponin I (High Sensitivity)     Status: Abnormal   Collection Time: 04/18/23  9:58 PM  Result Value Ref Range   Troponin I (High Sensitivity) 47 (H) <18 ng/L    Comment: (NOTE) Elevated high sensitivity troponin I (hsTnI) values and significant  changes across serial measurements may suggest ACS but many other  chronic and acute conditions are known to elevate hsTnI results.  Refer to the "Links" section for chest pain algorithms and additional   guidance. Performed at [facility], [address], [place], [place] [address]   Comprehensive metabolic panel     Status: Abnormal   Collection Time: 04/19/23  3:26 AM  Result Value Ref Range   Sodium 135 135 - 145 mmol/L   Potassium 4.0 3.5 - 5.1 mmol/L   Chloride 106 98 - 111 mmol/L   CO2 18 (L) 22 - 32 mmol/L   Glucose, Bld 132 (H) 70 - 99 mg/dL    Comment: Glucose reference range applies only to samples taken after fasting for at least 8 hours.   BUN 24 (H) 6 - 20 mg/dL   Creatinine, Ser 2.95 0.61 - 1.24 mg/dL   Calcium 8.8 (L) 8.9 - 10.3 mg/dL   Total Protein 6.9 6.5 - 8.1 g/dL   Albumin 3.3 (L) 3.5 - 5.0 g/dL   AST 59 (H) 15 - 41 U/L   ALT 42 0 - 44 U/L   Alkaline Phosphatase 76 38 - 126 U/L   Total Bilirubin 1.3 (H) <1.2 mg/dL   GFR, Estimated >28 >41 mL/min    Comment: (NOTE) Calculated using the CKD-EPI Creatinine Equation (2021)    Anion gap 11 5 - 15    Comment: Performed at [facility], [address], [place], [place] [address]  CBC     Status: None   Collection Time: 04/19/23  3:26 AM  Result Value Ref Range   WBC 10.4 4.0 - 10.5 K/uL   RBC 5.07 4.22 - 5.81 MIL/uL   Hemoglobin 14.8 13.0 - 17.0 g/dL   HCT 10.2 72.5 - 36.6 %   MCV 91.3 80.0 - 100.0 fL   MCH 29.2 26.0 - 34.0 pg   MCHC 32.0 30.0 - 36.0 g/dL   RDW 44.0 34.7 - 42.5 %   Platelets 241 150 - 400 K/uL   nRBC 0.0 0.0 - 0.2 %    Comment: Performed at [facility], [address] [place], [place] [address]  Magnesium     Status: None   Collection Time: 04/19/23  3:26 AM  Result Value Ref Range   Magnesium 2.1 1.7 - 2.4 mg/dL    Comment: Performed at [facility], [address] [place], [place] [address]  Phosphorus     Status: None   Collection Time: 04/19/23  3:26 AM  Result Value Ref Range   Phosphorus 4.0 2.5 - 4.6 mg/dL    Comment: Performed at [facility], [address] [place], [place] [address]  CBG monitoring, ED     Status: Abnormal   Collection Time: 04/19/23  3:30 AM  Result  Value Ref Range   Glucose-Capillary 133 (H) 70 - 99 mg/dL    Comment: Glucose reference range applies only to samples taken after fasting for at least 8 hours.  CBG monitoring, ED     Status: Abnormal   Collection Time: 04/19/23  4:16 AM  Result Value Ref Range   Glucose-Capillary 114 (H) 70 - 99  mg/dL    Comment: Glucose reference range applies only to samples taken after fasting for at least 8 hours.  CBG monitoring, ED     Status: Abnormal   Collection Time: 04/19/23  7:21 AM  Result Value Ref Range   Glucose-Capillary 120 (H) 70 - 99 mg/dL    Comment: Glucose reference range applies only to samples taken after fasting for at least 8 hours.  Urinalysis, Routine w reflex microscopic -Urine, Clean Catch     Status: Abnormal   Collection Time: 04/19/23  8:40 AM  Result Value Ref Range   Color, Urine YELLOW YELLOW   APPearance CLEAR CLEAR   Specific Gravity, Urine >1.046 (H) 1.005 - 1.030   pH 5.0 5.0 - 8.0   Glucose, UA >=500 (A) NEGATIVE mg/dL   Hgb urine dipstick SMALL (A) NEGATIVE   Bilirubin Urine NEGATIVE NEGATIVE   Ketones, ur 5 (A) NEGATIVE mg/dL   Protein, ur 30 (A) NEGATIVE mg/dL   Nitrite NEGATIVE NEGATIVE   Leukocytes,Ua NEGATIVE NEGATIVE   RBC / HPF 0-5 0 - 5 RBC/hpf   WBC, UA 0-5 0 - 5 WBC/hpf   Bacteria, UA NONE SEEN NONE SEEN   Squamous Epithelial / HPF 0-5 0 - 5 /HPF   Mucus PRESENT     Comment: Performed at [facility], [address], [place], [place] [address]    CT Angio Chest PE W and/or Wo Contrast  Result Date: 04/18/2023 CLINICAL DATA:  Pulmonary embolus suspected with low to intermediate probability. Positive D-dimer. Near syncope, dizziness, and shortness of breath. Nonproductive cough. Acute nonlocalized abdominal pain. EXAM: CT ANGIOGRAPHY CHEST CT ABDOMEN AND PELVIS WITH CONTRAST TECHNIQUE: Multidetector CT imaging of the chest was performed using the standard protocol during bolus administration of intravenous contrast. Multiplanar CT image  reconstructions and MIPs were obtained to evaluate the vascular anatomy. Multidetector CT imaging of the abdomen and pelvis was performed using the standard protocol during bolus administration of intravenous contrast. RADIATION DOSE REDUCTION: This exam was performed according to the departmental dose-optimization program which includes automated exposure control, adjustment of the mA and/or kV according to patient size and/or use of iterative reconstruction technique. CONTRAST:  OMNIPAQUE IOHEXOL 350 MG/ML SOLN COMPARISON:  CT chest 03/23/2023. FINDINGS: CTA CHEST FINDINGS Cardiovascular: Good opacification of the central and segmental pulmonary arteries. No focal filling defects. No evidence of significant pulmonary embolus. Normal caliber thoracic aorta. Calcification in the coronary arteries. Coronary stents. Mild calcification in the thoracic aorta. Mediastinum/Nodes: No enlarged mediastinal, hilar, or axillary lymph nodes. Thyroid gland, trachea, and esophagus demonstrate no significant findings. Lungs/Pleura: Mild interstitial changes to the lungs may indicate chronic fibrosis or acute edema. No pleural effusions. No pneumothorax. No focal consolidation. Musculoskeletal: No chest wall abnormality. No acute or significant osseous findings. Review of the MIP images confirms the above findings. CT ABDOMEN and PELVIS FINDINGS Hepatobiliary: Gallbladder wall is thickened with pericholecystic edema and stranding. No stones are identified. Changes may represent acute cholecystitis, either acalculous or with non radiopaque stones. No bile duct dilatation. No focal liver lesions. Pancreas: Unremarkable. No pancreatic ductal dilatation or surrounding inflammatory changes. Spleen: Normal in size without focal abnormality. Adrenals/Urinary Tract: Adrenal glands are unremarkable. Kidneys are normal, without renal calculi, focal lesion, or hydronephrosis. Bladder is unremarkable. Stomach/Bowel: Stomach is within  normal limits. Appendix appears normal. No evidence of bowel wall thickening, distention, or inflammatory changes. Vascular/Lymphatic: Aortic atherosclerosis. No enlarged abdominal or pelvic lymph nodes. Reproductive: Prostate is unremarkable. Other: Small amount of free fluid  around the liver, likely reactive. No free air. Abdominal wall musculature appears intact. Musculoskeletal: No acute or significant osseous findings. Review of the MIP images confirms the above findings. IMPRESSION: 1. No evidence of significant pulmonary embolus. 2. Mild interstitial changes in the lungs, possibly chronic fibrosis or acute edema. No focal consolidation. 3. Gallbladder wall thickening with pericholecystic edema, likely cholecystitis. No discrete stones are identified. 4. Aortic atherosclerosis. 5. Small amount of free fluid around the liver is likely reactive. Electronically Signed   By: Burman Nieves M.D.   On: 04/18/2023 22:29   CT ABDOMEN PELVIS W CONTRAST  Result Date: 04/18/2023 CLINICAL DATA:  Pulmonary embolus suspected with low to intermediate probability. Positive D-dimer. Near syncope, dizziness, and shortness of breath. Nonproductive cough. Acute nonlocalized abdominal pain. EXAM: CT ANGIOGRAPHY CHEST CT ABDOMEN AND PELVIS WITH CONTRAST TECHNIQUE: Multidetector CT imaging of the chest was performed using the standard protocol during bolus administration of intravenous contrast. Multiplanar CT image reconstructions and MIPs were obtained to evaluate the vascular anatomy. Multidetector CT imaging of the abdomen and pelvis was performed using the standard protocol during bolus administration of intravenous contrast. RADIATION DOSE REDUCTION: This exam was performed according to the departmental dose-optimization program which includes automated exposure control, adjustment of the mA and/or kV according to patient size and/or use of iterative reconstruction technique. CONTRAST:  OMNIPAQUE IOHEXOL 350 MG/ML  SOLN COMPARISON:  CT chest 03/23/2023. FINDINGS: CTA CHEST FINDINGS Cardiovascular: Good opacification of the central and segmental pulmonary arteries. No focal filling defects. No evidence of significant pulmonary embolus. Normal caliber thoracic aorta. Calcification in the coronary arteries. Coronary stents. Mild calcification in the thoracic aorta. Mediastinum/Nodes: No enlarged mediastinal, hilar, or axillary lymph nodes. Thyroid gland, trachea, and esophagus demonstrate no significant findings. Lungs/Pleura: Mild interstitial changes to the lungs may indicate chronic fibrosis or acute edema. No pleural effusions. No pneumothorax. No focal consolidation. Musculoskeletal: No chest wall abnormality. No acute or significant osseous findings. Review of the MIP images confirms the above findings. CT ABDOMEN and PELVIS FINDINGS Hepatobiliary: Gallbladder wall is thickened with pericholecystic edema and stranding. No stones are identified. Changes may represent acute cholecystitis, either acalculous or with non radiopaque stones. No bile duct dilatation. No focal liver lesions. Pancreas: Unremarkable. No pancreatic ductal dilatation or surrounding inflammatory changes. Spleen: Normal in size without focal abnormality. Adrenals/Urinary Tract: Adrenal glands are unremarkable. Kidneys are normal, without renal calculi, focal lesion, or hydronephrosis. Bladder is unremarkable. Stomach/Bowel: Stomach is within normal limits. Appendix appears normal. No evidence of bowel wall thickening, distention, or inflammatory changes. Vascular/Lymphatic: Aortic atherosclerosis. No enlarged abdominal or pelvic lymph nodes. Reproductive: Prostate is unremarkable. Other: Small amount of free fluid around the liver, likely reactive. No free air. Abdominal wall musculature appears intact. Musculoskeletal: No acute or significant osseous findings. Review of the MIP images confirms the above findings. IMPRESSION: 1. No evidence of significant  pulmonary embolus. 2. Mild interstitial changes in the lungs, possibly chronic fibrosis or acute edema. No focal consolidation. 3. Gallbladder wall thickening with pericholecystic edema, likely cholecystitis. No discrete stones are identified. 4. Aortic atherosclerosis. 5. Small amount of free fluid around the liver is likely reactive. Electronically Signed   By: Burman Nieves M.D.   On: 04/18/2023 22:29   DG Chest Portable 1 View  Result Date: 04/18/2023 CLINICAL DATA:  Near-syncope.  Shortness of breath EXAM: PORTABLE CHEST 1 VIEW COMPARISON:  X-ray and CT angiogram 03/23/2023. FINDINGS: Stable enlarged cardiopericardial silhouette with some vascular congestion. No consolidation, pneumothorax or effusion.  No edema. Overlapping cardiac leads. The right inferior costophrenic angle is clipped off the edge of the film. IMPRESSION: No significant interval change. Electronically Signed   By: Karen Kays M.D.   On: 04/18/2023 20:52     Assessment & Plan:  Evan [name] is a 57 y.o. male who was admitted with concern for acute cholecystitis.  Imaging and blood work evaluated by myself.  -Given patient's significant cardiac history with most recent EF being 35%, elevated BNP, troponin, and D-dimer, I feel this patient is not an appropriate candidate for surgery during this admission -Patient with benign abdominal exam, normal LFTs excluding a slightly increased bilirubin today of 1.3, and no leukocytosis, we will obtain a HIDA scan to fully rule in or out acute cholecystitis -If HIDA scan is demonstrating concerns for acute cholecystitis, patient will need to undergo percutaneous cholecystostomy tube with IR.  He potentially would be able to have cholecystectomy in the future after cardiac optimization -Patient states he is not taking Plavix, but will continue to hold this while inpatient -Continue IV antibiotics -Okay for a diet at this time.  Will need to be NPO at midnight for HIDA -PRN pain  control and antiemetics -Appreciate hospitalist recommendations  All questions were answered to the satisfaction of the patient.  -- Theophilus Kinds, DO [facility] [address] [place], [place] [address] [phone] (office)

## 2023-04-19 NOTE — ED Notes (Addendum)
POC CBG was 114 glucose monitor failed to transfer data.

## 2023-04-19 NOTE — Progress Notes (Signed)
PROGRESS NOTE   Evan [name], is a 57 y.o. male, DOB - Oct 06, 1965, ZOX:[id number]  Admit date - 04/18/2023   Admitting Physician Frankey Shown, DO  Outpatient Primary MD for the patient is Dettinger, Elige Radon, MD  LOS - 1  Chief Complaint  Patient presents with   Near Syncope      Brief Narrative:   57 y.o. male with medical history significant of Hypertension, hyperlipidemia, GERD, T2DM, CAD status post PCI, ischemic cardiomyopathy admitted on 04/18/2023 with abdominal pain and concerns for acute cholecystitis    -Assessment and Plan: 1)Acute cholecystitis--nausea and abdominal pain noted -No fevers or leukocytosis CT abdomen and pelvis report noted -Right upper quadrant ultrasound pending -Continue as needed opiates and as needed antiemetics -Continue Protonix- -continue cefepime and Flagyl -defer timing of lap chole to general surgeon  2)Ischemic Cardiomyopathy/History of chronic combined systolic and Diastolic dysfunction CHF -Chest pain-free Troponin 51 >>47 -Echo from April 2022 with EF of 35 to 40% and grade 2 diastolic dysfunction Repeat echo at [facility] on 10/06/2022 indicated LVEF 35%, grade 1 diastolic dysfunction, mild MR, LA mildly dilated, mild PAH, mild to moderately dilated LV  -BNP 1039 -CTA chest with findings suggestive of chronic fibrosis versus acute edema -Resume Aldactone and Lasix Monitor input and output -Daily weights  3)CAD---S/p anterolateral MI in 11/2018 with intervention to proximal LAD and staged RCA intervention.   hold metoprolol due to BP concerns -Continue aspirin -Hold atorvastatin  4)DM2-A1c 7.4 reflecting uncontrolled DM with hyperglycemia PTA -Hold PTA Ozempic Use Novolog/Humalog Sliding scale insulin with Accu-Cheks/Fingersticks as ordered   5)GERD--c/n Protonix  Status is: Inpatient   Disposition: The patient is from: Home              Anticipated d/c is to: Home              Anticipated d/c date is: 2 days               Patient currently is not medically stable to d/c. Barriers: Not Clinically Stable-  Code Status :  -  Code Status: Full Code   Family Communication:    NA (patient is alert, awake and coherent)   DVT Prophylaxis  :   - SCDs   SCDs Start: 04/19/23 0310   Lab Results  Component Value Date   PLT 241 04/19/2023   Inpatient Medications  Scheduled Meds:  Chlorhexidine Gluconate Cloth  6 each Topical Q0600   insulin aspart  0-15 Units Subcutaneous Q4H   pantoprazole (PROTONIX) IV  40 mg Intravenous Q24H   Continuous Infusions:  ceFEPime (MAXIPIME) IV 200 mL/hr at 04/19/23 0846   metronidazole 500 mg (04/19/23 0912)   PRN Meds:.acetaminophen **OR** acetaminophen, HYDROmorphone (DILAUDID) injection, ondansetron **OR** ondansetron (ZOFRAN) IV   Anti-infectives (From admission, onward)    Start     Dose/Rate Route Frequency Ordered Stop   04/19/23 0700  ceFEPIme (MAXIPIME) 2 g in sodium chloride 0.9 % 100 mL IVPB       Note to Pharmacy: Cefepime 2 g IV q8h for CrCl < 60 mL/min   2 g 200 mL/hr over 30 Minutes Intravenous Every 8 hours 04/19/23 0258     04/18/23 2245  ceFEPIme (MAXIPIME) 2 g in sodium chloride 0.9 % 100 mL IVPB        2 g 200 mL/hr over 30 Minutes Intravenous  Once 04/18/23 2241 04/19/23 0053   04/18/23 2245  metroNIDAZOLE (FLAGYL) IVPB 500 mg        500  mg 100 mL/hr over 60 Minutes Intravenous Every 12 hours 04/18/23 2241        Subjective: [name] today has no fevers,  No chest pain,   - Complains of nausea but no emesis -Complains of abdominal pain  Objective: Vitals:   04/19/23 0740 04/19/23 0745 04/19/23 0800 04/19/23 0900  BP:  104/67 126/89 (!) 135/98  Pulse:  95 96 99  Resp:  20 18 20   Temp: 98.4 F (36.9 C)     TempSrc: Oral     SpO2:  97% 94% 96%  Weight: 118.2 kg     Height: 6\' 5"  (1.956 m)       Intake/Output Summary (Last 24 hours) at 04/19/2023 0953 Last data filed at 04/19/2023 0846 Gross per 24 hour  Intake 590.43 ml   Output 1000 ml  Net -409.57 ml   Filed Weights   04/18/23 2007 04/19/23 0740  Weight: 118 kg 118.2 kg    Physical Exam Gen:- Awake Alert,  in no apparent distress  HEENT:- [place].AT, No sclera icterus Neck-Supple Neck,No JVD,.  Lungs-  CTAB , fair symmetrical air movement CV- S1, S2 normal, regular  Abd-  +ve B.Sounds, Abd Soft, epigastric and right upper quadrant tenderness, no rebound or guarding Extremity/Skin:- Trace  edema, pedal pulses present  Psych-affect is appropriate, oriented x3 Neuro-no new focal deficits, no tremors  Data Reviewed: I have personally reviewed following labs and imaging studies  CBC: Recent Labs  Lab 04/18/23 2030 04/19/23 0326  WBC 10.0 10.4  NEUTROABS 7.2  --   HGB 15.4 14.8  HCT 48.9 46.3  MCV 92.4 91.3  PLT 268 241   Basic Metabolic Panel: Recent Labs  Lab 04/18/23 2030 04/19/23 0326  NA 136 135  K 4.3 4.0  CL 104 106  CO2 23 18*  GLUCOSE 182* 132*  BUN 26* 24*  CREATININE 1.25* 1.00  CALCIUM 9.3 8.8*  MG  --  2.1  PHOS  --  4.0   GFR: Estimated Creatinine Clearance: 116.1 mL/min (by C-G formula based on SCr of 1 mg/dL). Liver Function Tests: Recent Labs  Lab 04/18/23 2030 04/19/23 0326  AST 48* 59*  ALT 29 42  ALKPHOS 82 76  BILITOT 0.8 1.3*  PROT 7.3 6.9  ALBUMIN 3.5 3.3*   Radiology Studies: CT Angio Chest PE W and/or Wo Contrast  Result Date: 04/18/2023 CLINICAL DATA:  Pulmonary embolus suspected with low to intermediate probability. Positive D-dimer. Near syncope, dizziness, and shortness of breath. Nonproductive cough. Acute nonlocalized abdominal pain. EXAM: CT ANGIOGRAPHY CHEST CT ABDOMEN AND PELVIS WITH CONTRAST TECHNIQUE: Multidetector CT imaging of the chest was performed using the standard protocol during bolus administration of intravenous contrast. Multiplanar CT image reconstructions and MIPs were obtained to evaluate the vascular anatomy. Multidetector CT imaging of the abdomen and pelvis was performed  using the standard protocol during bolus administration of intravenous contrast. RADIATION DOSE REDUCTION: This exam was performed according to the departmental dose-optimization program which includes automated exposure control, adjustment of the mA and/or kV according to patient size and/or use of iterative reconstruction technique. CONTRAST:  OMNIPAQUE IOHEXOL 350 MG/ML SOLN COMPARISON:  CT chest 03/23/2023. FINDINGS: CTA CHEST FINDINGS Cardiovascular: Good opacification of the central and segmental pulmonary arteries. No focal filling defects. No evidence of significant pulmonary embolus. Normal caliber thoracic aorta. Calcification in the coronary arteries. Coronary stents. Mild calcification in the thoracic aorta. Mediastinum/Nodes: No enlarged mediastinal, hilar, or axillary lymph nodes. Thyroid gland, trachea, and esophagus demonstrate no  significant findings. Lungs/Pleura: Mild interstitial changes to the lungs may indicate chronic fibrosis or acute edema. No pleural effusions. No pneumothorax. No focal consolidation. Musculoskeletal: No chest wall abnormality. No acute or significant osseous findings. Review of the MIP images confirms the above findings. CT ABDOMEN and PELVIS FINDINGS Hepatobiliary: Gallbladder wall is thickened with pericholecystic edema and stranding. No stones are identified. Changes may represent acute cholecystitis, either acalculous or with non radiopaque stones. No bile duct dilatation. No focal liver lesions. Pancreas: Unremarkable. No pancreatic ductal dilatation or surrounding inflammatory changes. Spleen: Normal in size without focal abnormality. Adrenals/Urinary Tract: Adrenal glands are unremarkable. Kidneys are normal, without renal calculi, focal lesion, or hydronephrosis. Bladder is unremarkable. Stomach/Bowel: Stomach is within normal limits. Appendix appears normal. No evidence of bowel wall thickening, distention, or inflammatory changes. Vascular/Lymphatic: Aortic  atherosclerosis. No enlarged abdominal or pelvic lymph nodes. Reproductive: Prostate is unremarkable. Other: Small amount of free fluid around the liver, likely reactive. No free air. Abdominal wall musculature appears intact. Musculoskeletal: No acute or significant osseous findings. Review of the MIP images confirms the above findings. IMPRESSION: 1. No evidence of significant pulmonary embolus. 2. Mild interstitial changes in the lungs, possibly chronic fibrosis or acute edema. No focal consolidation. 3. Gallbladder wall thickening with pericholecystic edema, likely cholecystitis. No discrete stones are identified. 4. Aortic atherosclerosis. 5. Small amount of free fluid around the liver is likely reactive. Electronically Signed   By: Burman Nieves M.D.   On: 04/18/2023 22:29   CT ABDOMEN PELVIS W CONTRAST  Result Date: 04/18/2023 CLINICAL DATA:  Pulmonary embolus suspected with low to intermediate probability. Positive D-dimer. Near syncope, dizziness, and shortness of breath. Nonproductive cough. Acute nonlocalized abdominal pain. EXAM: CT ANGIOGRAPHY CHEST CT ABDOMEN AND PELVIS WITH CONTRAST TECHNIQUE: Multidetector CT imaging of the chest was performed using the standard protocol during bolus administration of intravenous contrast. Multiplanar CT image reconstructions and MIPs were obtained to evaluate the vascular anatomy. Multidetector CT imaging of the abdomen and pelvis was performed using the standard protocol during bolus administration of intravenous contrast. RADIATION DOSE REDUCTION: This exam was performed according to the departmental dose-optimization program which includes automated exposure control, adjustment of the mA and/or kV according to patient size and/or use of iterative reconstruction technique. CONTRAST:  OMNIPAQUE IOHEXOL 350 MG/ML SOLN COMPARISON:  CT chest 03/23/2023. FINDINGS: CTA CHEST FINDINGS Cardiovascular: Good opacification of the central and segmental pulmonary  arteries. No focal filling defects. No evidence of significant pulmonary embolus. Normal caliber thoracic aorta. Calcification in the coronary arteries. Coronary stents. Mild calcification in the thoracic aorta. Mediastinum/Nodes: No enlarged mediastinal, hilar, or axillary lymph nodes. Thyroid gland, trachea, and esophagus demonstrate no significant findings. Lungs/Pleura: Mild interstitial changes to the lungs may indicate chronic fibrosis or acute edema. No pleural effusions. No pneumothorax. No focal consolidation. Musculoskeletal: No chest wall abnormality. No acute or significant osseous findings. Review of the MIP images confirms the above findings. CT ABDOMEN and PELVIS FINDINGS Hepatobiliary: Gallbladder wall is thickened with pericholecystic edema and stranding. No stones are identified. Changes may represent acute cholecystitis, either acalculous or with non radiopaque stones. No bile duct dilatation. No focal liver lesions. Pancreas: Unremarkable. No pancreatic ductal dilatation or surrounding inflammatory changes. Spleen: Normal in size without focal abnormality. Adrenals/Urinary Tract: Adrenal glands are unremarkable. Kidneys are normal, without renal calculi, focal lesion, or hydronephrosis. Bladder is unremarkable. Stomach/Bowel: Stomach is within normal limits. Appendix appears normal. No evidence of bowel wall thickening, distention, or inflammatory changes. Vascular/Lymphatic: Aortic atherosclerosis.  No enlarged abdominal or pelvic lymph nodes. Reproductive: Prostate is unremarkable. Other: Small amount of free fluid around the liver, likely reactive. No free air. Abdominal wall musculature appears intact. Musculoskeletal: No acute or significant osseous findings. Review of the MIP images confirms the above findings. IMPRESSION: 1. No evidence of significant pulmonary embolus. 2. Mild interstitial changes in the lungs, possibly chronic fibrosis or acute edema. No focal consolidation. 3.  Gallbladder wall thickening with pericholecystic edema, likely cholecystitis. No discrete stones are identified. 4. Aortic atherosclerosis. 5. Small amount of free fluid around the liver is likely reactive. Electronically Signed   By: Burman Nieves M.D.   On: 04/18/2023 22:29   DG Chest Portable 1 View  Result Date: 04/18/2023 CLINICAL DATA:  Near-syncope.  Shortness of breath EXAM: PORTABLE CHEST 1 VIEW COMPARISON:  X-ray and CT angiogram 03/23/2023. FINDINGS: Stable enlarged cardiopericardial silhouette with some vascular congestion. No consolidation, pneumothorax or effusion. No edema. Overlapping cardiac leads. The right inferior costophrenic angle is clipped off the edge of the film. IMPRESSION: No significant interval change. Electronically Signed   By: Karen Kays M.D.   On: 04/18/2023 20:52    Scheduled Meds:  Chlorhexidine Gluconate Cloth  6 each Topical Q0600   insulin aspart  0-15 Units Subcutaneous Q4H   pantoprazole (PROTONIX) IV  40 mg Intravenous Q24H   Continuous Infusions:  ceFEPime (MAXIPIME) IV 200 mL/hr at 04/19/23 0846   metronidazole 500 mg (04/19/23 0912)    LOS: 1 day   Shon Hale M.D on 04/19/2023 at 9:53 AM  Go to [email or web address] - for contact info  [facility] - Office  [phone]  If 7PM-7AM, please contact night-coverage [email or web address] 04/19/2023, 9:53 AM

## 2023-04-20 ENCOUNTER — Encounter (HOSPITAL_COMMUNITY): Payer: Self-pay | Admitting: Internal Medicine

## 2023-04-20 ENCOUNTER — Inpatient Hospital Stay (HOSPITAL_COMMUNITY): Payer: Medicaid Other

## 2023-04-20 DIAGNOSIS — K810 Acute cholecystitis: Secondary | ICD-10-CM | POA: Diagnosis not present

## 2023-04-20 LAB — GLUCOSE, CAPILLARY
Glucose-Capillary: 121 mg/dL — ABNORMAL HIGH (ref 70–99)
Glucose-Capillary: 132 mg/dL — ABNORMAL HIGH (ref 70–99)
Glucose-Capillary: 147 mg/dL — ABNORMAL HIGH (ref 70–99)
Glucose-Capillary: 154 mg/dL — ABNORMAL HIGH (ref 70–99)
Glucose-Capillary: 194 mg/dL — ABNORMAL HIGH (ref 70–99)
Glucose-Capillary: 224 mg/dL — ABNORMAL HIGH (ref 70–99)

## 2023-04-20 LAB — PROTIME-INR
INR: 1.3 — ABNORMAL HIGH (ref 0.8–1.2)
Prothrombin Time: 16.6 s — ABNORMAL HIGH (ref 11.4–15.2)

## 2023-04-20 MED ORDER — METOPROLOL SUCCINATE ER 25 MG PO TB24
25.0000 mg | ORAL_TABLET | Freq: Every day | ORAL | Status: DC
  Filled 2023-04-20 (×2): qty 1

## 2023-04-20 NOTE — Progress Notes (Signed)
Mobility Specialist Progress Note:    04/20/23 1345  Mobility  Activity Refused mobility   Pt politely refused mobility d/t independent ambulation. All needs met.   Lawerance Bach Mobility Specialist Please contact via Special educational needs teacher or  Rehab office at [phone]

## 2023-04-20 NOTE — Progress Notes (Signed)
Patient was c/o feeling sob. I checked his o2 and it was 99% on RA. Patient was c/o pain in his abdomen, 7/10. PRN dilaudid was given. Patient started to become confused, he said he needed to go to the hospital. I told him he was already in the hospital. He then precedes to say his face is itching and he is seeing things. He says he doesn't feel right, said he feels like he is standing up floating. Patient seems to be having some sort of reaction maybe to the dilaudid? Vitals checked, stable. BP 106/79 (89), HR 100, RR 17, O2 97% RA. Adefeso, DO made aware.

## 2023-04-20 NOTE — Plan of Care (Signed)
  Problem: Education: Goal: Ability to describe self-care measures that may prevent or decrease complications (Diabetes Survival Skills Education) will improve Outcome: Progressing   Problem: Coping: Goal: Ability to adjust to condition or change in health will improve Outcome: Progressing   

## 2023-04-20 NOTE — Progress Notes (Signed)
Tele called, patient had 11 beat run of SVT. HR up to 181, nonsustained. Patient having no complaints of pain, sitting up at the edge of the bed talking. Adefeso, DO notified.

## 2023-04-20 NOTE — Progress Notes (Signed)
This nurse was informed by pt's family that pt has eaten. Pt is ordered to be NPO d/t HIDA scan and possible IR. Pt states, "I'm not doing the scan because I can't lay flat for 2 hours and no one told me what type of scan I was having. I'm hungry and thirsty and I want to go home." MD made aware of pt's refusal and noncompliance to NPO diet order. Will continue to monitor.

## 2023-04-20 NOTE — Progress Notes (Signed)
Tele called, patient had 17 beat run of multifocal PVC's. Adefeso, DO notified.

## 2023-04-20 NOTE — Progress Notes (Signed)
[facility] Progress Note     Subjective: Patient seen and examined.  He is resting comfortably in bed.  He denies any abdominal pain, was able to tolerate his diet yesterday without nausea and vomiting.  He was refusing HIDA scan this morning, because he does not believe that he can lay flat/breathe while laying flat for an extended period of time.  Objective: Vital signs in last 24 hours: Temp:  [97.6 F (36.4 C)-98.9 F (37.2 C)] 98.9 F (37.2 C) (12/02 0428) Pulse Rate:  [59-103] 59 (12/02 0428) BP: (106-132)/(57-92) 111/82 (12/02 0428) SpO2:  [97 %-99 %] 99 % (12/02 0428) Weight:  [010.2 kg] 119.2 kg (12/02 0428) Last BM Date : 04/19/23  Intake/Output from previous day: 12/01 0701 - 12/02 0700 In: 2427.1 [P.O.:1440; I.V.:394.9; IV Piggyback:592.2] Out: 1000 [Urine:1000] Intake/Output this shift: No intake/output data recorded.  General appearance: alert, cooperative, and no distress GI: Abdomen soft, nondistended, no percussion tenderness, nontender to palpation; no rigidity, guarding, rebound tenderness; negative Murphy sign; palpable edge of the liver and right upper quadrant  Lab Results:  Recent Labs    04/18/23 2030 04/19/23 0326  WBC 10.0 10.4  HGB 15.4 14.8  HCT 48.9 46.3  PLT 268 241   BMET Recent Labs    04/18/23 2030 04/19/23 0326  NA 136 135  K 4.3 4.0  CL 104 106  CO2 23 18*  GLUCOSE 182* 132*  BUN 26* 24*  CREATININE 1.25* 1.00  CALCIUM 9.3 8.8*   PT/INR Recent Labs    04/20/23 1105  LABPROT 16.6*  INR 1.3*    Studies/Results: US Abdomen Limited  Result Date: 04/19/2023 CLINICAL DATA:  [id number] Abdominal pain [id number] EXAM: ULTRASOUND ABDOMEN LIMITED RIGHT UPPER QUADRANT COMPARISON:  CT 04/18/2023 FINDINGS: Gallbladder: Gallbladder wall abnormally thickened measuring 10 mm. Adjacent pericholecystic fluid. Small amount of layering sludge. No shadowing stones. No sonographic Murphy sign noted by sonographer. Common bile  duct: Diameter: 3 mm. Liver: No focal lesion identified. Within normal limits in parenchymal echogenicity. Portal vein is patent on color Doppler imaging with normal direction of blood flow towards the liver. Other: None. IMPRESSION: Abnormal gallbladder wall thickening with pericholecystic fluid and sludge. No shadowing stones. Findings are equivocal for acute cholecystitis. If further evaluation is needed, consider nuclear medicine HIDA scan. Electronically Signed   By: Duanne Guess D.O.   On: 04/19/2023 10:53   CT Angio Chest PE W and/or Wo Contrast  Result Date: 04/18/2023 CLINICAL DATA:  Pulmonary embolus suspected with low to intermediate probability. Positive D-dimer. Near syncope, dizziness, and shortness of breath. Nonproductive cough. Acute nonlocalized abdominal pain. EXAM: CT ANGIOGRAPHY CHEST CT ABDOMEN AND PELVIS WITH CONTRAST TECHNIQUE: Multidetector CT imaging of the chest was performed using the standard protocol during bolus administration of intravenous contrast. Multiplanar CT image reconstructions and MIPs were obtained to evaluate the vascular anatomy. Multidetector CT imaging of the abdomen and pelvis was performed using the standard protocol during bolus administration of intravenous contrast. RADIATION DOSE REDUCTION: This exam was performed according to the departmental dose-optimization program which includes automated exposure control, adjustment of the mA and/or kV according to patient size and/or use of iterative reconstruction technique. CONTRAST:  OMNIPAQUE IOHEXOL 350 MG/ML SOLN COMPARISON:  CT chest 03/23/2023. FINDINGS: CTA CHEST FINDINGS Cardiovascular: Good opacification of the central and segmental pulmonary arteries. No focal filling defects. No evidence of significant pulmonary embolus. Normal caliber thoracic aorta. Calcification in the coronary arteries. Coronary stents. Mild calcification in the thoracic aorta. Mediastinum/Nodes: No  enlarged mediastinal,  hilar, or axillary lymph nodes. Thyroid gland, trachea, and esophagus demonstrate no significant findings. Lungs/Pleura: Mild interstitial changes to the lungs may indicate chronic fibrosis or acute edema. No pleural effusions. No pneumothorax. No focal consolidation. Musculoskeletal: No chest wall abnormality. No acute or significant osseous findings. Review of the MIP images confirms the above findings. CT ABDOMEN and PELVIS FINDINGS Hepatobiliary: Gallbladder wall is thickened with pericholecystic edema and stranding. No stones are identified. Changes may represent acute cholecystitis, either acalculous or with non radiopaque stones. No bile duct dilatation. No focal liver lesions. Pancreas: Unremarkable. No pancreatic ductal dilatation or surrounding inflammatory changes. Spleen: Normal in size without focal abnormality. Adrenals/Urinary Tract: Adrenal glands are unremarkable. Kidneys are normal, without renal calculi, focal lesion, or hydronephrosis. Bladder is unremarkable. Stomach/Bowel: Stomach is within normal limits. Appendix appears normal. No evidence of bowel wall thickening, distention, or inflammatory changes. Vascular/Lymphatic: Aortic atherosclerosis. No enlarged abdominal or pelvic lymph nodes. Reproductive: Prostate is unremarkable. Other: Small amount of free fluid around the liver, likely reactive. No free air. Abdominal wall musculature appears intact. Musculoskeletal: No acute or significant osseous findings. Review of the MIP images confirms the above findings. IMPRESSION: 1. No evidence of significant pulmonary embolus. 2. Mild interstitial changes in the lungs, possibly chronic fibrosis or acute edema. No focal consolidation. 3. Gallbladder wall thickening with pericholecystic edema, likely cholecystitis. No discrete stones are identified. 4. Aortic atherosclerosis. 5. Small amount of free fluid around the liver is likely reactive. Electronically Signed   By: Burman Nieves M.D.   On:  04/18/2023 22:29   CT ABDOMEN PELVIS W CONTRAST  Result Date: 04/18/2023 CLINICAL DATA:  Pulmonary embolus suspected with low to intermediate probability. Positive D-dimer. Near syncope, dizziness, and shortness of breath. Nonproductive cough. Acute nonlocalized abdominal pain. EXAM: CT ANGIOGRAPHY CHEST CT ABDOMEN AND PELVIS WITH CONTRAST TECHNIQUE: Multidetector CT imaging of the chest was performed using the standard protocol during bolus administration of intravenous contrast. Multiplanar CT image reconstructions and MIPs were obtained to evaluate the vascular anatomy. Multidetector CT imaging of the abdomen and pelvis was performed using the standard protocol during bolus administration of intravenous contrast. RADIATION DOSE REDUCTION: This exam was performed according to the departmental dose-optimization program which includes automated exposure control, adjustment of the mA and/or kV according to patient size and/or use of iterative reconstruction technique. CONTRAST:  OMNIPAQUE IOHEXOL 350 MG/ML SOLN COMPARISON:  CT chest 03/23/2023. FINDINGS: CTA CHEST FINDINGS Cardiovascular: Good opacification of the central and segmental pulmonary arteries. No focal filling defects. No evidence of significant pulmonary embolus. Normal caliber thoracic aorta. Calcification in the coronary arteries. Coronary stents. Mild calcification in the thoracic aorta. Mediastinum/Nodes: No enlarged mediastinal, hilar, or axillary lymph nodes. Thyroid gland, trachea, and esophagus demonstrate no significant findings. Lungs/Pleura: Mild interstitial changes to the lungs may indicate chronic fibrosis or acute edema. No pleural effusions. No pneumothorax. No focal consolidation. Musculoskeletal: No chest wall abnormality. No acute or significant osseous findings. Review of the MIP images confirms the above findings. CT ABDOMEN and PELVIS FINDINGS Hepatobiliary: Gallbladder wall is thickened with pericholecystic edema and  stranding. No stones are identified. Changes may represent acute cholecystitis, either acalculous or with non radiopaque stones. No bile duct dilatation. No focal liver lesions. Pancreas: Unremarkable. No pancreatic ductal dilatation or surrounding inflammatory changes. Spleen: Normal in size without focal abnormality. Adrenals/Urinary Tract: Adrenal glands are unremarkable. Kidneys are normal, without renal calculi, focal lesion, or hydronephrosis. Bladder is unremarkable. Stomach/Bowel: Stomach is within normal limits. Appendix  appears normal. No evidence of bowel wall thickening, distention, or inflammatory changes. Vascular/Lymphatic: Aortic atherosclerosis. No enlarged abdominal or pelvic lymph nodes. Reproductive: Prostate is unremarkable. Other: Small amount of free fluid around the liver, likely reactive. No free air. Abdominal wall musculature appears intact. Musculoskeletal: No acute or significant osseous findings. Review of the MIP images confirms the above findings. IMPRESSION: 1. No evidence of significant pulmonary embolus. 2. Mild interstitial changes in the lungs, possibly chronic fibrosis or acute edema. No focal consolidation. 3. Gallbladder wall thickening with pericholecystic edema, likely cholecystitis. No discrete stones are identified. 4. Aortic atherosclerosis. 5. Small amount of free fluid around the liver is likely reactive. Electronically Signed   By: Burman Nieves M.D.   On: 04/18/2023 22:29   DG Chest Portable 1 View  Result Date: 04/18/2023 CLINICAL DATA:  Near-syncope.  Shortness of breath EXAM: PORTABLE CHEST 1 VIEW COMPARISON:  X-ray and CT angiogram 03/23/2023. FINDINGS: Stable enlarged cardiopericardial silhouette with some vascular congestion. No consolidation, pneumothorax or effusion. No edema. Overlapping cardiac leads. The right inferior costophrenic angle is clipped off the edge of the film. IMPRESSION: No significant interval change. Electronically Signed   By:  Karen Kays M.D.   On: 04/18/2023 20:52    Anti-infectives: Anti-infectives (From admission, onward)    Start     Dose/Rate Route Frequency Ordered Stop   04/19/23 0700  ceFEPIme (MAXIPIME) 2 g in sodium chloride 0.9 % 100 mL IVPB       Note to Pharmacy: Cefepime 2 g IV q8h for CrCl < 60 mL/min   2 g 200 mL/hr over 30 Minutes Intravenous Every 8 hours 04/19/23 0258     04/18/23 2245  ceFEPIme (MAXIPIME) 2 g in sodium chloride 0.9 % 100 mL IVPB        2 g 200 mL/hr over 30 Minutes Intravenous  Once 04/18/23 2241 04/19/23 0053   04/18/23 2245  metroNIDAZOLE (FLAGYL) IVPB 500 mg        500 mg 100 mL/hr over 60 Minutes Intravenous Every 12 hours 04/18/23 2241         Assessment/Plan:  Patient is a 57 year old male who was admitted with concern for acute cholecystitis.  -Abdominal ultrasound yesterday was equivocal for acute cholecystitis, so HIDA scan was ordered -Patient is refusing HIDA scan this morning, as he does not feel he can lay flat for an extended period of time.  I had a long discussion with the patient about how we need this imaging study before we place a percutaneous cholecystostomy tube, as it will verify that the tube is necessary.  Patient is still refusing imaging.  I explained to the patient that if he refuses the imaging study, we may need to send him home with antibiotics and he will have to return to the hospital if he has worsening abdominal pain.  We also discussed that he may have further issues in the future related to his gallbladder -Discussed case with IR, and they are requiring HIDA scan prior to proceeding with percutaneous cholecystostomy tube -Per nurse, patient is still refusing HIDA scan and ate lunch even though NPO order was in place. -Again, patient is not an appropriate candidate for surgery during this admission given his significant cardiac history with most recent EF being 35%, elevated BNP, troponin, and D-dimer -Given patient's refusal to obtain  HIDA scan, I recommend that the patient go home with 7 days of total treatment of antibiotics for possible acute cholecystitis.  He needs to return to  the emergency department if he begins to have epigastric/right upper quadrant/low chest pain, nausea, vomiting, jaundice, fevers, or altered mentation -Appreciate hospitalist recommendations   LOS: 2 days    Evan [name] 04/20/2023

## 2023-04-20 NOTE — Progress Notes (Signed)
PROGRESS NOTE   Evan [name], is a 57 y.o. male, DOB - Oct 09, 1965, GMW:[id number]  Admit date - 04/18/2023   Admitting Physician Evan Shown, DO  Outpatient Primary MD for the patient is Dettinger, Elige Radon, MD  LOS - 2  Chief Complaint  Patient presents with   Near Syncope      Brief Narrative:   57 y.o. male with medical history significant of Hypertension, hyperlipidemia, GERD, T2DM, CAD status post PCI, ischemic cardiomyopathy admitted on 04/18/2023 with abdominal pain and concerns for acute cholecystitis    -Assessment and Plan: 1)Acute cholecystitis--nausea and abdominal pain noted -No fevers or leukocytosis CT abdomen and pelvis report noted -Right upper quadrant ultrasound pending -Continue as needed opiates and as needed antiemetics -Continue Protonix- -continue cefepime and Flagyl 04/20/23 -IR states they cannot place percutaneous colostomy tube without HIDA scan to confirm cholecystitis Patient initially refused HIDA scan today -Patient states that he is willing to consider doing HIDA scan in a.m. on 04/21/2023 -Will keep him n.p.o. after midnight and try again tomorrow   2)Ischemic Cardiomyopathy/History of chronic combined systolic and Diastolic dysfunction CHF -Chest pain-free Troponin 51 >>47 -Echo from April 2022 with EF of 35 to 40% and grade 2 diastolic dysfunction Repeat echo at [facility] on 10/06/2022 indicated LVEF 35%, grade 1 diastolic dysfunction, mild MR, LA mildly dilated, mild PAH, mild to moderately dilated LV  -BNP 1039 -CTA chest with findings suggestive of chronic fibrosis versus acute edema -Resume Aldactone and Lasix Monitor input and output -Daily weights  3)CAD---S/p anterolateral MI in 11/2018 with intervention to proximal LAD and staged RCA intervention.  -Chest pain-free, continue metoprolol  and aspirin -Hold atorvastatin  4)DM2-A1c 7.4 reflecting uncontrolled DM with hyperglycemia PTA -Hold PTA Ozempic Use Novolog/Humalog  Sliding scale insulin with Accu-Cheks/Fingersticks as ordered   5)GERD--c/n Protonix  Status is: Inpatient   Disposition: The patient is from: Home              Anticipated d/c is to: Home              Anticipated d/c date is: 2 days              Patient currently is not medically stable to d/c. Barriers: Not Clinically Stable-  Code Status :  -  Code Status: Full Code   Family Communication:    (patient is alert, awake and coherent)  Discussed with Girlfriend and mother at bedside--  DVT Prophylaxis  :   - SCDs   SCDs Start: 04/19/23 0310   Lab Results  Component Value Date   PLT 241 04/19/2023   Inpatient Medications  Scheduled Meds:  aspirin EC  81 mg Oral Q breakfast   Chlorhexidine Gluconate Cloth  6 each Topical Q0600   furosemide  20 mg Oral BID   insulin aspart  0-15 Units Subcutaneous Q4H   pantoprazole (PROTONIX) IV  40 mg Intravenous Q24H   spironolactone  25 mg Oral Daily   Continuous Infusions:  ceFEPime (MAXIPIME) IV 2 g (04/20/23 1518)   metronidazole 500 mg (04/20/23 1003)   PRN Meds:.acetaminophen **OR** acetaminophen, ondansetron **OR** ondansetron (ZOFRAN) IV   Anti-infectives (From admission, onward)    Start     Dose/Rate Route Frequency Ordered Stop   04/19/23 0700  ceFEPIme (MAXIPIME) 2 g in sodium chloride 0.9 % 100 mL IVPB       Note to Pharmacy: Cefepime 2 g IV q8h for CrCl < 60 mL/min   2 g 200 mL/hr over 30  Minutes Intravenous Every 8 hours 04/19/23 0258     04/18/23 2245  ceFEPIme (MAXIPIME) 2 g in sodium chloride 0.9 % 100 mL IVPB        2 g 200 mL/hr over 30 Minutes Intravenous  Once 04/18/23 2241 04/19/23 0053   04/18/23 2245  metroNIDAZOLE (FLAGYL) IVPB 500 mg        500 mg 100 mL/hr over 60 Minutes Intravenous Every 12 hours 04/18/23 2241        Subjective: Evan [name] today has no fevers,  No chest pain,   - Girlfriend and mother at bedside--- questions answered -Abdominal discomfort on and off -Requesting food LPN  Evan [name] at bedside - Patient initially refused HIDA scan today -Patient states that he is willing to consider doing HIDA scan in a.m. on 04/21/2023 -Will keep him n.p.o. after midnight and try again tomorrow  Objective: Vitals:   04/20/23 0313 04/20/23 0333 04/20/23 0428 04/20/23 1416  BP: 108/87 106/79 111/82 (!) 128/97  Pulse:  100 (!) 59 100  Resp:      Temp:   98.9 F (37.2 C) 98.7 F (37.1 C)  TempSrc:   Oral Oral  SpO2: 99% 97% 99% 100%  Weight:   119.2 kg   Height:        Intake/Output Summary (Last 24 hours) at 04/20/2023 1822 Last data filed at 04/20/2023 1518 Gross per 24 hour  Intake 865.51 ml  Output --  Net 865.51 ml   Filed Weights   04/18/23 2007 04/19/23 0740 04/20/23 0428  Weight: 118 kg 118.2 kg 119.2 kg    Physical Exam Gen:- Awake Alert,  in no apparent distress  HEENT:- [place].AT, No sclera icterus Neck-Supple Neck,No JVD,.  Lungs-  CTAB , fair symmetrical air movement CV- S1, S2 normal, regular  Abd-  +ve B.Sounds, Abd Soft, mild epigastric and right upper quadrant tenderness, no rebound or guarding Extremity/Skin:- Trace  edema, pedal pulses present  Psych-affect is appropriate, oriented x3 Neuro-no new focal deficits, no tremors  Data Reviewed: I have personally reviewed following labs and imaging studies  CBC: Recent Labs  Lab 04/18/23 2030 04/19/23 0326  WBC 10.0 10.4  NEUTROABS 7.2  --   HGB 15.4 14.8  HCT 48.9 46.3  MCV 92.4 91.3  PLT 268 241   Basic Metabolic Panel: Recent Labs  Lab 04/18/23 2030 04/19/23 0326  NA 136 135  K 4.3 4.0  CL 104 106  CO2 23 18*  GLUCOSE 182* 132*  BUN 26* 24*  CREATININE 1.25* 1.00  CALCIUM 9.3 8.8*  MG  --  2.1  PHOS  --  4.0   GFR: Estimated Creatinine Clearance: 116.5 mL/min (by C-G formula based on SCr of 1 mg/dL). Liver Function Tests: Recent Labs  Lab 04/18/23 2030 04/19/23 0326  AST 48* 59*  ALT 29 42  ALKPHOS 82 76  BILITOT 0.8 1.3*  PROT 7.3 6.9  ALBUMIN 3.5 3.3*    Radiology Studies: US Abdomen Limited  Result Date: 04/19/2023 CLINICAL DATA:  [id number] Abdominal pain [id number] EXAM: ULTRASOUND ABDOMEN LIMITED RIGHT UPPER QUADRANT COMPARISON:  CT 04/18/2023 FINDINGS: Gallbladder: Gallbladder wall abnormally thickened measuring 10 mm. Adjacent pericholecystic fluid. Small amount of layering sludge. No shadowing stones. No sonographic Murphy sign noted by sonographer. Common bile duct: Diameter: 3 mm. Liver: No focal lesion identified. Within normal limits in parenchymal echogenicity. Portal vein is patent on color Doppler imaging with normal direction of blood flow towards the liver. Other: None. IMPRESSION: Abnormal gallbladder wall thickening with  pericholecystic fluid and sludge. No shadowing stones. Findings are equivocal for acute cholecystitis. If further evaluation is needed, consider nuclear medicine HIDA scan. Electronically Signed   By: Duanne Guess D.O.   On: 04/19/2023 10:53   CT Angio Chest PE W and/or Wo Contrast  Result Date: 04/18/2023 CLINICAL DATA:  Pulmonary embolus suspected with low to intermediate probability. Positive D-dimer. Near syncope, dizziness, and shortness of breath. Nonproductive cough. Acute nonlocalized abdominal pain. EXAM: CT ANGIOGRAPHY CHEST CT ABDOMEN AND PELVIS WITH CONTRAST TECHNIQUE: Multidetector CT imaging of the chest was performed using the standard protocol during bolus administration of intravenous contrast. Multiplanar CT image reconstructions and MIPs were obtained to evaluate the vascular anatomy. Multidetector CT imaging of the abdomen and pelvis was performed using the standard protocol during bolus administration of intravenous contrast. RADIATION DOSE REDUCTION: This exam was performed according to the departmental dose-optimization program which includes automated exposure control, adjustment of the mA and/or kV according to patient size and/or use of iterative reconstruction technique. CONTRAST:  OMNIPAQUE  IOHEXOL 350 MG/ML SOLN COMPARISON:  CT chest 03/23/2023. FINDINGS: CTA CHEST FINDINGS Cardiovascular: Good opacification of the central and segmental pulmonary arteries. No focal filling defects. No evidence of significant pulmonary embolus. Normal caliber thoracic aorta. Calcification in the coronary arteries. Coronary stents. Mild calcification in the thoracic aorta. Mediastinum/Nodes: No enlarged mediastinal, hilar, or axillary lymph nodes. Thyroid gland, trachea, and esophagus demonstrate no significant findings. Lungs/Pleura: Mild interstitial changes to the lungs may indicate chronic fibrosis or acute edema. No pleural effusions. No pneumothorax. No focal consolidation. Musculoskeletal: No chest wall abnormality. No acute or significant osseous findings. Review of the MIP images confirms the above findings. CT ABDOMEN and PELVIS FINDINGS Hepatobiliary: Gallbladder wall is thickened with pericholecystic edema and stranding. No stones are identified. Changes may represent acute cholecystitis, either acalculous or with non radiopaque stones. No bile duct dilatation. No focal liver lesions. Pancreas: Unremarkable. No pancreatic ductal dilatation or surrounding inflammatory changes. Spleen: Normal in size without focal abnormality. Adrenals/Urinary Tract: Adrenal glands are unremarkable. Kidneys are normal, without renal calculi, focal lesion, or hydronephrosis. Bladder is unremarkable. Stomach/Bowel: Stomach is within normal limits. Appendix appears normal. No evidence of bowel wall thickening, distention, or inflammatory changes. Vascular/Lymphatic: Aortic atherosclerosis. No enlarged abdominal or pelvic lymph nodes. Reproductive: Prostate is unremarkable. Other: Small amount of free fluid around the liver, likely reactive. No free air. Abdominal wall musculature appears intact. Musculoskeletal: No acute or significant osseous findings. Review of the MIP images confirms the above findings. IMPRESSION: 1. No  evidence of significant pulmonary embolus. 2. Mild interstitial changes in the lungs, possibly chronic fibrosis or acute edema. No focal consolidation. 3. Gallbladder wall thickening with pericholecystic edema, likely cholecystitis. No discrete stones are identified. 4. Aortic atherosclerosis. 5. Small amount of free fluid around the liver is likely reactive. Electronically Signed   By: Burman Nieves M.D.   On: 04/18/2023 22:29   CT ABDOMEN PELVIS W CONTRAST  Result Date: 04/18/2023 CLINICAL DATA:  Pulmonary embolus suspected with low to intermediate probability. Positive D-dimer. Near syncope, dizziness, and shortness of breath. Nonproductive cough. Acute nonlocalized abdominal pain. EXAM: CT ANGIOGRAPHY CHEST CT ABDOMEN AND PELVIS WITH CONTRAST TECHNIQUE: Multidetector CT imaging of the chest was performed using the standard protocol during bolus administration of intravenous contrast. Multiplanar CT image reconstructions and MIPs were obtained to evaluate the vascular anatomy. Multidetector CT imaging of the abdomen and pelvis was performed using the standard protocol during bolus administration of intravenous contrast. RADIATION DOSE  REDUCTION: This exam was performed according to the departmental dose-optimization program which includes automated exposure control, adjustment of the mA and/or kV according to patient size and/or use of iterative reconstruction technique. CONTRAST:  OMNIPAQUE IOHEXOL 350 MG/ML SOLN COMPARISON:  CT chest 03/23/2023. FINDINGS: CTA CHEST FINDINGS Cardiovascular: Good opacification of the central and segmental pulmonary arteries. No focal filling defects. No evidence of significant pulmonary embolus. Normal caliber thoracic aorta. Calcification in the coronary arteries. Coronary stents. Mild calcification in the thoracic aorta. Mediastinum/Nodes: No enlarged mediastinal, hilar, or axillary lymph nodes. Thyroid gland, trachea, and esophagus demonstrate no significant  findings. Lungs/Pleura: Mild interstitial changes to the lungs may indicate chronic fibrosis or acute edema. No pleural effusions. No pneumothorax. No focal consolidation. Musculoskeletal: No chest wall abnormality. No acute or significant osseous findings. Review of the MIP images confirms the above findings. CT ABDOMEN and PELVIS FINDINGS Hepatobiliary: Gallbladder wall is thickened with pericholecystic edema and stranding. No stones are identified. Changes may represent acute cholecystitis, either acalculous or with non radiopaque stones. No bile duct dilatation. No focal liver lesions. Pancreas: Unremarkable. No pancreatic ductal dilatation or surrounding inflammatory changes. Spleen: Normal in size without focal abnormality. Adrenals/Urinary Tract: Adrenal glands are unremarkable. Kidneys are normal, without renal calculi, focal lesion, or hydronephrosis. Bladder is unremarkable. Stomach/Bowel: Stomach is within normal limits. Appendix appears normal. No evidence of bowel wall thickening, distention, or inflammatory changes. Vascular/Lymphatic: Aortic atherosclerosis. No enlarged abdominal or pelvic lymph nodes. Reproductive: Prostate is unremarkable. Other: Small amount of free fluid around the liver, likely reactive. No free air. Abdominal wall musculature appears intact. Musculoskeletal: No acute or significant osseous findings. Review of the MIP images confirms the above findings. IMPRESSION: 1. No evidence of significant pulmonary embolus. 2. Mild interstitial changes in the lungs, possibly chronic fibrosis or acute edema. No focal consolidation. 3. Gallbladder wall thickening with pericholecystic edema, likely cholecystitis. No discrete stones are identified. 4. Aortic atherosclerosis. 5. Small amount of free fluid around the liver is likely reactive. Electronically Signed   By: Burman Nieves M.D.   On: 04/18/2023 22:29   DG Chest Portable 1 View  Result Date: 04/18/2023 CLINICAL DATA:   Near-syncope.  Shortness of breath EXAM: PORTABLE CHEST 1 VIEW COMPARISON:  X-ray and CT angiogram 03/23/2023. FINDINGS: Stable enlarged cardiopericardial silhouette with some vascular congestion. No consolidation, pneumothorax or effusion. No edema. Overlapping cardiac leads. The right inferior costophrenic angle is clipped off the edge of the film. IMPRESSION: No significant interval change. Electronically Signed   By: Karen Kays M.D.   On: 04/18/2023 20:52    Scheduled Meds:  aspirin EC  81 mg Oral Q breakfast   Chlorhexidine Gluconate Cloth  6 each Topical Q0600   furosemide  20 mg Oral BID   insulin aspart  0-15 Units Subcutaneous Q4H   pantoprazole (PROTONIX) IV  40 mg Intravenous Q24H   spironolactone  25 mg Oral Daily   Continuous Infusions:  ceFEPime (MAXIPIME) IV 2 g (04/20/23 1518)   metronidazole 500 mg (04/20/23 1003)    LOS: 2 days   Shon Hale M.D on 04/20/2023 at 6:22 PM  Go to [email or web address] - for contact info  [facility] - Office  [phone]  If 7PM-7AM, please contact night-coverage [email or web address] 04/20/2023, 6:22 PM

## 2023-04-20 NOTE — TOC CM/SW Note (Signed)
Transition of Care [facility]) - Inpatient Brief Assessment   Patient Details  Name: Evan [name] MRN: [id number] Date of Birth: 1965/09/01  Transition of Care [facility]) CM/SW Contact:    Villa Herb, LCSWA Phone Number: 04/20/2023, 9:58 AM   Clinical Narrative: Transition of Care Department [facility]) has reviewed patient and no TOC needs have been identified at this time. We will continue to monitor patient advancement through interdisciplinary progression rounds. If new patient transition needs arise, please place a TOC consult.  Transition of Care Asessment: Insurance and Status: Insurance coverage has been reviewed Patient has primary care physician: Yes Home environment has been reviewed: from home Prior level of function:: independent Prior/Current Home Services: No current home services Social Determinants of Health Reivew: SDOH reviewed no interventions necessary Readmission risk has been reviewed: Yes Transition of care needs: no transition of care needs at this time

## 2023-04-20 NOTE — Progress Notes (Signed)
Tele called, patient had two runs of bigeminy. [place], DO notified.

## 2023-04-21 ENCOUNTER — Inpatient Hospital Stay (HOSPITAL_COMMUNITY): Payer: Medicaid Other

## 2023-04-21 DIAGNOSIS — K810 Acute cholecystitis: Secondary | ICD-10-CM | POA: Diagnosis not present

## 2023-04-21 LAB — COMPREHENSIVE METABOLIC PANEL
ALT: 122 U/L — ABNORMAL HIGH (ref 0–44)
AST: 130 U/L — ABNORMAL HIGH (ref 15–41)
Albumin: 3.1 g/dL — ABNORMAL LOW (ref 3.5–5.0)
Alkaline Phosphatase: 73 U/L (ref 38–126)
Anion gap: 9 (ref 5–15)
BUN: 21 mg/dL — ABNORMAL HIGH (ref 6–20)
CO2: 22 mmol/L (ref 22–32)
Calcium: 8.7 mg/dL — ABNORMAL LOW (ref 8.9–10.3)
Chloride: 106 mmol/L (ref 98–111)
Creatinine, Ser: 0.86 mg/dL (ref 0.61–1.24)
GFR, Estimated: >60 mL/min (ref >60)
Glucose, Bld: 132 mg/dL — ABNORMAL HIGH (ref 70–99)
Potassium: 3.5 mmol/L (ref 3.5–5.1)
Sodium: 137 mmol/L (ref 135–145)
Total Bilirubin: 0.5 mg/dL (ref <1.2)
Total Protein: 6.6 g/dL (ref 6.5–8.1)

## 2023-04-21 LAB — GLUCOSE, CAPILLARY
Glucose-Capillary: 134 mg/dL — ABNORMAL HIGH (ref 70–99)
Glucose-Capillary: 133 mg/dL — ABNORMAL HIGH (ref 70–99)

## 2023-04-21 MED ORDER — METRONIDAZOLE 500 MG PO TABS: 500.0000 mg | ORAL_TABLET | Freq: Three times a day (TID) | ORAL | 0 refills | Status: AC

## 2023-04-21 MED ORDER — SPIRONOLACTONE 25 MG PO TABS
12.5000 mg | ORAL_TABLET | Freq: Every day | ORAL | 3 refills | Status: DC
Start: 2023-04-21 — End: 2023-10-21

## 2023-04-21 MED ORDER — FAMOTIDINE 20 MG PO TABS
20.0000 mg | ORAL_TABLET | Freq: Two times a day (BID) | ORAL | 3 refills | Status: AC
Start: 2023-04-21 — End: ?

## 2023-04-21 MED ORDER — ASPIRIN 81 MG PO TBEC: 81.0000 mg | DELAYED_RELEASE_TABLET | Freq: Every day | ORAL | 12 refills | Status: DC

## 2023-04-21 MED ORDER — ACETAMINOPHEN 325 MG PO TABS: 650.0000 mg | ORAL_TABLET | Freq: Four times a day (QID) | ORAL | Status: AC | PRN

## 2023-04-21 MED ORDER — CEFDINIR 300 MG PO CAPS: 300.0000 mg | ORAL_CAPSULE | Freq: Two times a day (BID) | ORAL | 0 refills | Status: AC

## 2023-04-21 MED ORDER — FUROSEMIDE 20 MG PO TABS: 20.0000 mg | ORAL_TABLET | Freq: Two times a day (BID) | ORAL | 3 refills | Status: DC

## 2023-04-21 MED ORDER — ATORVASTATIN CALCIUM 80 MG PO TABS: 80.0000 mg | ORAL_TABLET | Freq: Every day | ORAL | 3 refills | Status: AC

## 2023-04-21 MED ORDER — PANTOPRAZOLE SODIUM 40 MG PO TBEC: 40.0000 mg | DELAYED_RELEASE_TABLET | Freq: Every day | ORAL | 1 refills | Status: DC

## 2023-04-21 NOTE — Progress Notes (Signed)
Ng Discharge Note  Admit Date:  04/18/2023 Discharge date: 04/21/2023   LONZA METHOT to be D/C'd Home per MD order.  AVS completed. Patient/caregiver able to verbalize understanding.  Discharge Medication: Allergies as of 04/21/2023   No Known Allergies      Medication List     TAKE these medications    Accu-Chek Aviva device Test BS BID Dx E11.69   Accu-Chek Aviva Plus w/Device Kit 1 each by Does not apply route 4 (four) times daily.   Accu-Chek Guide test strip Generic drug: glucose blood Check BS in the morning and at bedtime Dx E11.8   acetaminophen 325 MG tablet Commonly known as: TYLENOL Take 2 tablets (650 mg total) by mouth every 6 (six) hours as needed for mild pain (pain score 1-3) or fever (or Fever >/= 101). What changed:  medication strength how much to take reasons to take this   aspirin EC 81 MG tablet Take 1 tablet (81 mg total) by mouth daily with breakfast. Swallow whole. Start taking on: April 22, 2023 What changed:  when to take this additional instructions   atorvastatin 80 MG tablet Commonly known as: LIPITOR Take 1 tablet (80 mg total) by mouth daily. What changed: when to take this   BD Pen Needle Nano 2nd Gen 32G X 4 MM Misc Generic drug: Insulin Pen Needle Use daily with insulin Dx E11.9, Z79.4   cefdinir 300 MG capsule Commonly known as: OMNICEF Take 1 capsule (300 mg total) by mouth 2 (two) times daily for 5 days.   empagliflozin 25 MG Tabs tablet Commonly known as: Jardiance Take 1 tablet (25 mg total) by mouth daily.   famotidine 20 MG tablet Commonly known as: Pepcid Take 1 tablet (20 mg total) by mouth 2 (two) times daily. What changed: when to take this   fenofibrate 145 MG tablet Commonly known as: TRICOR Take 1 tablet (145 mg total) by mouth daily.   Fish Oil 1000 MG Caps Take 2,000 mg by mouth every other day.   furosemide 20 MG tablet Commonly known as: LASIX Take 1 tablet (20 mg total) by mouth 2 (two)  times daily. What changed: when to take this   losartan 25 MG tablet Commonly known as: COZAAR Take 1 tablet (25 mg total) by mouth daily. What changed: when to take this   metoprolol succinate 25 MG 24 hr tablet Commonly known as: Toprol XL Take 1 tablet (25 mg total) by mouth daily.   metroNIDAZOLE 500 MG tablet Commonly known as: FLAGYL Take 1 tablet (500 mg total) by mouth 3 (three) times daily for 5 days.   pantoprazole 40 MG tablet Commonly known as: Protonix Take 1 tablet (40 mg total) by mouth daily.   Semaglutide(0.25 or 0.5MG /DOS) 2 MG/3ML Sopn Inject 0.5 mg into the skin once a week.   spironolactone 25 MG tablet Commonly known as: ALDACTONE Take 0.5 tablets (12.5 mg total) by mouth daily.        Discharge Assessment: Vitals:   04/20/23 2010 04/21/23 0417  BP: (!) 116/91 (!) 106/47  Pulse: (!) 102 97  Resp: 18   Temp: 98.2 F (36.8 C)   SpO2: 100% 98%   Skin clean, dry and intact without evidence of skin break down, no evidence of skin tears noted. IV catheter discontinued intact. Site without signs and symptoms of complications - no redness or edema noted at insertion site, patient denies c/o pain - only slight tenderness at site.  Dressing with slight pressure  applied.  D/c Instructions-Education: Discharge instructions given to patient/family with verbalized understanding. D/c education completed with patient/family including follow up instructions, medication list, d/c activities limitations if indicated, with other d/c instructions as indicated by MD - patient able to verbalize understanding, all questions fully answered. Patient instructed to return to ED, call 911, or call MD for any changes in condition.  Patient escorted via WC, and D/C home via private auto.  Cristal Ford, LPN 16/05/958 45:40 AM

## 2023-04-21 NOTE — Discharge Summary (Signed)
MUSA SORICH, is a 57 y.o. male  DOB [birth year]  MRN [id number].  Admission date:  04/18/2023  Admitting Physician  Frankey Shown, DO  Discharge Date:  04/21/2023   Primary MD  Dettinger, Elige Radon, MD  Recommendations for primary care physician for things to follow:  1)Avoid ibuprofen/Advil/Aleve/Motrin/Goody Powders/Naproxen/BC powders/Meloxicam/Diclofenac/Indomethacin and other Nonsteroidal anti-inflammatory medications as these will make you more likely to bleed and can cause stomach ulcers, can also cause Kidney problems.  2)You have refused further testing/imaging studies for your gallbladder----please call or return to the ER or follow-up with your primary care physician if you have fevers, chills, nausea, vomiting or belly pain or jaundice especially on the right upper part of your belly  3)Please take your medications as prescribed including your antibiotics for your gallbladder  4)Your liver enzymes are high most likely due to gallbladder disease so please hold atorvastatin/Lipitor until your liver enzymes are rechecked in about a week or so  Admission Diagnosis  Acute cholecystitis [K81.0] Dizziness [R42] Cholecystitis, acute [K81.0] Near syncope [R55]   Discharge Diagnosis  Acute cholecystitis [K81.0] Dizziness [R42] Cholecystitis, acute [K81.0] Near syncope [R55]    Principal Problem:   Acute cholecystitis Active Problems:   Essential hypertension   Mixed hyperlipidemia   GERD (gastroesophageal reflux disease)   CAD S/P PCI   Abdominal pain   Elevated brain natriuretic peptide (BNP) level   Elevated troponin   Elevated d-dimer   Type 2 diabetes mellitus with hyperglycemia (HCC)     Past Medical History:  Diagnosis Date   Arthritis    Atrial fibrillation (HCC)    CHF (congestive heart failure) (HCC)    Diabetes mellitus without complication (HCC)    Gout    Heart attack (HCC)  10/2018   Hyperlipidemia    Hypertension    Morbid obesity (HCC) 12/22/2016   Sleep apnea    had test twice unable to complete    Past Surgical History:  Procedure Laterality Date   ANKLE ARTHROSCOPY Right    COLONOSCOPY WITH PROPOFOL N/A 09/18/2020   Procedure: COLONOSCOPY WITH PROPOFOL;  Surgeon: Sherrilyn Rist, MD;  Location: [facility];  Service: Gastroenterology;  Laterality: N/A;  09-18-2020   CORONARY STENT INTERVENTION N/A 11/24/2018   Procedure: CORONARY STENT INTERVENTION;  Surgeon: Iran Ouch, MD;  Location: [facility];  Service: Cardiovascular;  Laterality: N/A;  prox and mid LAD   CORONARY STENT INTERVENTION N/A 11/26/2018   Procedure: CORONARY STENT INTERVENTION;  Surgeon: Iran Ouch, MD;  Location: [facility];  Service: Cardiovascular;  Laterality: N/A;   CYST REMOVAL HAND     KNEE ARTHROSCOPY Left    LEFT HEART CATH AND CORONARY ANGIOGRAPHY N/A 11/24/2018   Procedure: LEFT HEART CATH AND CORONARY ANGIOGRAPHY;  Surgeon: Iran Ouch, MD;  Location: [facility];  Service: Cardiovascular;  Laterality: N/A;   POLYPECTOMY  09/18/2020   Procedure: POLYPECTOMY;  Surgeon: Sherrilyn Rist, MD;  Location: [facility];  Service: Gastroenterology;;  HPI  from the history and physical done on the day of admission:   HPI: Zavon Gausman Borsellino is a 57 y.o. male with medical history significant of Hypertension, hyperlipidemia, GERD, T2DM, CAD status post PCI, ischemic cardiomyopathy who presents to the emergency department due to shortness of breath, lightheadedness, abdominal pain in the lower quadrants which started this afternoon (about 45 minutes) after taking new metoprolol succinate 25 mg that was newly prescribed by his physician.  Symptoms worsens with ambulation.  He denies syncopal or near syncopal episode, but complained of bilateral swelling in feet and some dried wounds   He was recently admitted from 5/20 to 5/23 at [facility] due to  right upper lobe PNA and was treated with IV antibiotics.  Patient became hypotensive during this admission and was taken off several of his BP meds at that time.   ED Course:  In the emergency department, he was hemodynamically stable.  Workup in the ED showed normal CBC and BMP except for BUN/creatinine 26/1.25 (baseline creatinine at 1.0-1.2) and blood glucose level at 182.  BNP 1039 (chronically elevated, this was 880 about 3 weeks ago).,  D-dimer 1.60, troponin 51 > 47. CT angiography of chest and CT abdomen and pelvis with contrast showed no evidence of significant pulmonary embolus. Mild interstitial changes in the lungs, possibly chronic fibrosis or acute edema. No focal consolidation. 3. Gallbladder wall thickening with pericholecystic edema, likely cholecystitis. No discrete stones are identified. Chest x-ray showed no significant interval change Patient was treated with IV cefepime, Flagyl.  Zofran was given due to nausea and IV fentanyl 50 mcg x 1 was given. General surgery was consulted and will follow-up with patient in the morning per EDP Hospitalist was asked to admit patient for further evaluation and management.   Review of Systems: Review of systems as noted in the HPI. All other systems reviewed and are negative.    Hospital Course:     Brief Narrative:   57 y.o. male with medical history significant of Hypertension, hyperlipidemia, GERD, T2DM, CAD status post PCI, ischemic cardiomyopathy admitted on 04/18/2023 with abdominal pain and concerns for acute cholecystitis     -Assessment and Plan: 1)Acute cholecystitis--no further abdominal pain, no nausea vomiting -No fevers or leukocytosis CT abdomen and pelvis report noted -Right upper quadrant ultrasound pending -Continue as needed opiates and as needed antiemetics -Continue Protonix- -continue cefepime and Flagyl -IR states they cannot place percutaneous colostomy tube without HIDA scan to confirm  cholecystitis Patient initially refused HIDA scan on 04/20/23 -Patient stated that he will be willing to consider doing HIDA scan in a.m. on 04/21/2023 -We kept him n.p.o. after midnight --- however, today 04/20/2022 patient is again refusing HIDA scan and requesting discharge home -Despite persuasion from patient's mother and patient's girlfriend he continues to refuse to do HIDA scan -We offered to premedicate him with benzos in case he was claustrophobic and he still refuses HIDA scan , he is requesting discharge home -Discussed with general surgery team and we will discharge home on oral antibiotics with outpatient follow-up as needed   2)Ischemic Cardiomyopathy/History of chronic combined systolic and Diastolic dysfunction CHF -Chest pain-free Troponin 51 >>47 -Echo from April 2022 with EF of 35 to 40% and grade 2 diastolic dysfunction Repeat echo at [facility] on 10/06/2022 indicated LVEF 35%, grade 1 diastolic dysfunction, mild MR, LA mildly dilated, mild PAH, mild to moderately dilated LV  -BNP 1039 -CTA chest with findings suggestive of chronic fibrosis versus acute edema -c/n  Aldactone and Lasix  3)CAD---S/p anterolateral MI in 11/2018 with intervention to proximal LAD and staged RCA intervention.  -Chest pain-free, continue metoprolol  and aspirin -Hold atorvastatin until LFTs are rechecked in about a week or so   4)DM2-A1c 7.4 reflecting uncontrolled DM with hyperglycemia PTA -restart PTA Ozempic and Jardiance   5)GERD--c/n Pepcid    Disposition: The patient is from: Home              Anticipated d/c is to: Home  Discharge Condition: Stable  Follow UP   Follow-up Information     Pappayliou, Catherine A, DO. Schedule an appointment as soon as possible for a visit in 1 week(s).   Specialty: General Surgery Why: Gallbladder Contact information: [address] [place] [facility] [address] [phone]         Dettinger, Elige Radon, MD. Schedule an appointment as soon  as possible for a visit in 1 week(s).   Specialties: Family Medicine, Cardiology Why: Repeat CMP in about 1 week Contact information: [address] [place] [address] [phone]                 Consults obtained - Gen Surgery  Diet and Activity recommendation:  As advised  Discharge Instructions    Discharge Instructions     Call MD for:  difficulty breathing, headache or visual disturbances   Complete by: As directed    Call MD for:  persistant dizziness or light-headedness   Complete by: As directed    Call MD for:  persistant nausea and vomiting   Complete by: As directed    Call MD for:  severe uncontrolled pain   Complete by: As directed    Call MD for:  temperature >100.4   Complete by: As directed    Diet - low sodium heart healthy   Complete by: As directed    Discharge instructions   Complete by: As directed    1)Avoid ibuprofen/Advil/Aleve/Motrin/Goody Powders/Naproxen/BC powders/Meloxicam/Diclofenac/Indomethacin and other Nonsteroidal anti-inflammatory medications as these will make you more likely to bleed and can cause stomach ulcers, can also cause Kidney problems.  2)You have refused further testing/imaging studies for your gallbladder----please call or return to the ER or follow-up with your primary care physician if you have fevers, chills, nausea, vomiting or belly pain or jaundice especially on the right upper part of your belly  3)Please take your medications as prescribed including your antibiotics for your gallbladder  4)Your liver enzymes are high most likely due to gallbladder disease so please hold atorvastatin/Lipitor until your liver enzymes are rechecked in about a week or so   Increase activity slowly   Complete by: As directed        Discharge Medications     Allergies as of 04/21/2023   No Known Allergies      Medication List     TAKE these medications    Accu-Chek Aviva device Test BS BID Dx E11.69   Accu-Chek Aviva  Plus w/Device Kit 1 each by Does not apply route 4 (four) times daily.   Accu-Chek Guide test strip Generic drug: glucose blood Check BS in the morning and at bedtime Dx E11.8   acetaminophen 325 MG tablet Commonly known as: TYLENOL Take 2 tablets (650 mg total) by mouth every 6 (six) hours as needed for mild pain (pain score 1-3) or fever (or Fever >/= 101). What changed:  medication strength how much to take reasons to take this   aspirin EC 81 MG tablet Take 1  tablet (81 mg total) by mouth daily with breakfast. Swallow whole. Start taking on: April 22, 2023 What changed:  when to take this additional instructions   atorvastatin 80 MG tablet Commonly known as: LIPITOR Take 1 tablet (80 mg total) by mouth daily. What changed: when to take this   BD Pen Needle Nano 2nd Gen 32G X 4 MM Misc Generic drug: Insulin Pen Needle Use daily with insulin Dx E11.9, Z79.4   cefdinir 300 MG capsule Commonly known as: OMNICEF Take 1 capsule (300 mg total) by mouth 2 (two) times daily for 5 days.   empagliflozin 25 MG Tabs tablet Commonly known as: Jardiance Take 1 tablet (25 mg total) by mouth daily.   famotidine 20 MG tablet Commonly known as: Pepcid Take 1 tablet (20 mg total) by mouth 2 (two) times daily. What changed: when to take this   fenofibrate 145 MG tablet Commonly known as: TRICOR Take 1 tablet (145 mg total) by mouth daily.   Fish Oil 1000 MG Caps Take 2,000 mg by mouth every other day.   furosemide 20 MG tablet Commonly known as: LASIX Take 1 tablet (20 mg total) by mouth 2 (two) times daily. What changed: when to take this   losartan 25 MG tablet Commonly known as: COZAAR Take 1 tablet (25 mg total) by mouth daily. What changed: when to take this   metoprolol succinate 25 MG 24 hr tablet Commonly known as: Toprol XL Take 1 tablet (25 mg total) by mouth daily.   metroNIDAZOLE 500 MG tablet Commonly known as: FLAGYL Take 1 tablet (500 mg total) by  mouth 3 (three) times daily for 5 days.   Semaglutide(0.25 or 0.5MG /DOS) 2 MG/3ML Sopn Inject 0.5 mg into the skin once a week.   spironolactone 25 MG tablet Commonly known as: ALDACTONE Take 0.5 tablets (12.5 mg total) by mouth daily.       Major procedures and Radiology Reports - PLEASE review detailed and final reports for all details, in brief -   US Abdomen Limited  Result Date: 04/19/2023 CLINICAL DATA:  [id number] Abdominal pain [id number] EXAM: ULTRASOUND ABDOMEN LIMITED RIGHT UPPER QUADRANT COMPARISON:  CT 04/18/2023 FINDINGS: Gallbladder: Gallbladder wall abnormally thickened measuring 10 mm. Adjacent pericholecystic fluid. Small amount of layering sludge. No shadowing stones. No sonographic Murphy sign noted by sonographer. Common bile duct: Diameter: 3 mm. Liver: No focal lesion identified. Within normal limits in parenchymal echogenicity. Portal vein is patent on color Doppler imaging with normal direction of blood flow towards the liver. Other: None. IMPRESSION: Abnormal gallbladder wall thickening with pericholecystic fluid and sludge. No shadowing stones. Findings are equivocal for acute cholecystitis. If further evaluation is needed, consider nuclear medicine HIDA scan. Electronically Signed   By: Duanne Guess D.O.   On: 04/19/2023 10:53   CT Angio Chest PE W and/or Wo Contrast  Result Date: 04/18/2023 CLINICAL DATA:  Pulmonary embolus suspected with low to intermediate probability. Positive D-dimer. Near syncope, dizziness, and shortness of breath. Nonproductive cough. Acute nonlocalized abdominal pain. EXAM: CT ANGIOGRAPHY CHEST CT ABDOMEN AND PELVIS WITH CONTRAST TECHNIQUE: Multidetector CT imaging of the chest was performed using the standard protocol during bolus administration of intravenous contrast. Multiplanar CT image reconstructions and MIPs were obtained to evaluate the vascular anatomy. Multidetector CT imaging of the abdomen and pelvis was performed using the standard  protocol during bolus administration of intravenous contrast. RADIATION DOSE REDUCTION: This exam was performed according to the departmental dose-optimization program which includes automated exposure control, adjustment  of the mA and/or kV according to patient size and/or use of iterative reconstruction technique. CONTRAST:  OMNIPAQUE IOHEXOL 350 MG/ML SOLN COMPARISON:  CT chest 03/23/2023. FINDINGS: CTA CHEST FINDINGS Cardiovascular: Good opacification of the central and segmental pulmonary arteries. No focal filling defects. No evidence of significant pulmonary embolus. Normal caliber thoracic aorta. Calcification in the coronary arteries. Coronary stents. Mild calcification in the thoracic aorta. Mediastinum/Nodes: No enlarged mediastinal, hilar, or axillary lymph nodes. Thyroid gland, trachea, and esophagus demonstrate no significant findings. Lungs/Pleura: Mild interstitial changes to the lungs may indicate chronic fibrosis or acute edema. No pleural effusions. No pneumothorax. No focal consolidation. Musculoskeletal: No chest wall abnormality. No acute or significant osseous findings. Review of the MIP images confirms the above findings. CT ABDOMEN and PELVIS FINDINGS Hepatobiliary: Gallbladder wall is thickened with pericholecystic edema and stranding. No stones are identified. Changes may represent acute cholecystitis, either acalculous or with non radiopaque stones. No bile duct dilatation. No focal liver lesions. Pancreas: Unremarkable. No pancreatic ductal dilatation or surrounding inflammatory changes. Spleen: Normal in size without focal abnormality. Adrenals/Urinary Tract: Adrenal glands are unremarkable. Kidneys are normal, without renal calculi, focal lesion, or hydronephrosis. Bladder is unremarkable. Stomach/Bowel: Stomach is within normal limits. Appendix appears normal. No evidence of bowel wall thickening, distention, or inflammatory changes. Vascular/Lymphatic: Aortic atherosclerosis.  No enlarged abdominal or pelvic lymph nodes. Reproductive: Prostate is unremarkable. Other: Small amount of free fluid around the liver, likely reactive. No free air. Abdominal wall musculature appears intact. Musculoskeletal: No acute or significant osseous findings. Review of the MIP images confirms the above findings. IMPRESSION: 1. No evidence of significant pulmonary embolus. 2. Mild interstitial changes in the lungs, possibly chronic fibrosis or acute edema. No focal consolidation. 3. Gallbladder wall thickening with pericholecystic edema, likely cholecystitis. No discrete stones are identified. 4. Aortic atherosclerosis. 5. Small amount of free fluid around the liver is likely reactive. Electronically Signed   By: Burman Nieves M.D.   On: 04/18/2023 22:29   CT ABDOMEN PELVIS W CONTRAST  Result Date: 04/18/2023 CLINICAL DATA:  Pulmonary embolus suspected with low to intermediate probability. Positive D-dimer. Near syncope, dizziness, and shortness of breath. Nonproductive cough. Acute nonlocalized abdominal pain. EXAM: CT ANGIOGRAPHY CHEST CT ABDOMEN AND PELVIS WITH CONTRAST TECHNIQUE: Multidetector CT imaging of the chest was performed using the standard protocol during bolus administration of intravenous contrast. Multiplanar CT image reconstructions and MIPs were obtained to evaluate the vascular anatomy. Multidetector CT imaging of the abdomen and pelvis was performed using the standard protocol during bolus administration of intravenous contrast. RADIATION DOSE REDUCTION: This exam was performed according to the departmental dose-optimization program which includes automated exposure control, adjustment of the mA and/or kV according to patient size and/or use of iterative reconstruction technique. CONTRAST:  OMNIPAQUE IOHEXOL 350 MG/ML SOLN COMPARISON:  CT chest 03/23/2023. FINDINGS: CTA CHEST FINDINGS Cardiovascular: Good opacification of the central and segmental pulmonary arteries. No  focal filling defects. No evidence of significant pulmonary embolus. Normal caliber thoracic aorta. Calcification in the coronary arteries. Coronary stents. Mild calcification in the thoracic aorta. Mediastinum/Nodes: No enlarged mediastinal, hilar, or axillary lymph nodes. Thyroid gland, trachea, and esophagus demonstrate no significant findings. Lungs/Pleura: Mild interstitial changes to the lungs may indicate chronic fibrosis or acute edema. No pleural effusions. No pneumothorax. No focal consolidation. Musculoskeletal: No chest wall abnormality. No acute or significant osseous findings. Review of the MIP images confirms the above findings. CT ABDOMEN and PELVIS FINDINGS Hepatobiliary: Gallbladder wall is thickened with pericholecystic  edema and stranding. No stones are identified. Changes may represent acute cholecystitis, either acalculous or with non radiopaque stones. No bile duct dilatation. No focal liver lesions. Pancreas: Unremarkable. No pancreatic ductal dilatation or surrounding inflammatory changes. Spleen: Normal in size without focal abnormality. Adrenals/Urinary Tract: Adrenal glands are unremarkable. Kidneys are normal, without renal calculi, focal lesion, or hydronephrosis. Bladder is unremarkable. Stomach/Bowel: Stomach is within normal limits. Appendix appears normal. No evidence of bowel wall thickening, distention, or inflammatory changes. Vascular/Lymphatic: Aortic atherosclerosis. No enlarged abdominal or pelvic lymph nodes. Reproductive: Prostate is unremarkable. Other: Small amount of free fluid around the liver, likely reactive. No free air. Abdominal wall musculature appears intact. Musculoskeletal: No acute or significant osseous findings. Review of the MIP images confirms the above findings. IMPRESSION: 1. No evidence of significant pulmonary embolus. 2. Mild interstitial changes in the lungs, possibly chronic fibrosis or acute edema. No focal consolidation. 3. Gallbladder wall  thickening with pericholecystic edema, likely cholecystitis. No discrete stones are identified. 4. Aortic atherosclerosis. 5. Small amount of free fluid around the liver is likely reactive. Electronically Signed   By: Burman Nieves M.D.   On: 04/18/2023 22:29   DG Chest Portable 1 View  Result Date: 04/18/2023 CLINICAL DATA:  Near-syncope.  Shortness of breath EXAM: PORTABLE CHEST 1 VIEW COMPARISON:  X-ray and CT angiogram 03/23/2023. FINDINGS: Stable enlarged cardiopericardial silhouette with some vascular congestion. No consolidation, pneumothorax or effusion. No edema. Overlapping cardiac leads. The right inferior costophrenic angle is clipped off the edge of the film. IMPRESSION: No significant interval change. Electronically Signed   By: Karen Kays M.D.   On: 04/18/2023 20:52   CT Angio Chest PE W and/or Wo Contrast  Result Date: 03/23/2023 CLINICAL DATA:  PE suspected. Short of breath. Short of breath 3 days. EXAM: CT ANGIOGRAPHY CHEST WITH CONTRAST TECHNIQUE: Multidetector CT imaging of the chest was performed using the standard protocol during bolus administration of intravenous contrast. Multiplanar CT image reconstructions and MIPs were obtained to evaluate the vascular anatomy. RADIATION DOSE REDUCTION: This exam was performed according to the departmental dose-optimization program which includes automated exposure control, adjustment of the mA and/or kV according to patient size and/or use of iterative reconstruction technique. CONTRAST:  75mL OMNIPAQUE IOHEXOL 350 MG/ML SOLN COMPARISON:  None Available. FINDINGS: Cardiovascular: No filling defects within the pulmonary arteries to suggest acute pulmonary embolism. Mediastinum/Nodes: No axillary or supraclavicular adenopathy. No mediastinal or hilar adenopathy. No pericardial fluid. Esophagus normal. Lungs/Pleura: No pulmonary infarction. No pneumonia. No pleural fluid. No pneumothorax Upper Abdomen: Limited view of the liver, kidneys,  pancreas are unremarkable. Normal adrenal glands. Musculoskeletal: No aggressive osseous lesion. Review of the MIP images confirms the above findings. IMPRESSION: 1. No evidence acute pulmonary embolism. 2. No acute pulmonary findings. Electronically Signed   By: Genevive Bi M.D.   On: 03/23/2023 18:53   DG Chest Port 1 View  Result Date: 03/23/2023 CLINICAL DATA:  Shortness of breath, chest pain. EXAM: PORTABLE CHEST 1 VIEW COMPARISON:  Oct 08, 2022. FINDINGS: Stable cardiomediastinal silhouette. Both lungs are clear. The visualized skeletal structures are unremarkable. IMPRESSION: No active disease. Electronically Signed   By: Lupita Raider M.D.   On: 03/23/2023 13:45    Micro Results   Recent Results (from the past 240 hour(s))  MRSA Next Gen by PCR, Nasal     Status: Abnormal   Collection Time: 04/19/23  8:00 AM   Specimen: Nasal Mucosa; Nasal Swab  Result Value Ref Range Status  MRSA by PCR Next Gen DETECTED (A) NOT DETECTED Final    Comment: RESULT CALLED TO, READ BACK BY AND VERIFIED WITH: C TARPIN AT 1101 ON [id number] BY S DALTON (NOTE) The GeneXpert MRSA Assay (FDA approved for NASAL specimens only), is one component of a comprehensive MRSA colonization surveillance program. It is not intended to diagnose MRSA infection nor to guide or monitor treatment for MRSA infections. Test performance is not FDA approved in patients less than 42 years old. Performed at [facility], [address], [place], [place] [address]    Today   Subjective    Renzo Pleva today has no new complaints   No fever  Or chills   No Nausea, Vomiting or Diarrhea  04/21/2023 -We kept him n.p.o. after midnight --- however, today 04/20/2022 patient is again refusing HIDA scan and requesting discharge home -Despite persuasion from patient's mother and patient's girlfriend he continues to refuse to do HIDA scan -We offered to premedicate him with benzos in case he was claustrophobic and he still  refuses HIDA scan , he is requesting discharge home -Discussed with general surgery team and we will discharge home on oral antibiotics with outpatient follow-up as needed        Patient has been seen and examined prior to discharge   Objective   Blood pressure (!) 106/47, pulse 97, temperature 98.2 F (36.8 C), resp. rate 18, height 6\' 5"  (1.956 m), weight 120.3 kg, SpO2 98%.   Intake/Output Summary (Last 24 hours) at 04/21/2023 1020 Last data filed at 04/21/2023 0956 Gross per 24 hour  Intake 638.14 ml  Output --  Net 638.14 ml    Exam Gen:- Awake Alert, no acute distress  HEENT:- [email or web address], No sclera icterus Neck-Supple Neck,No JVD,.  Lungs-  CTAB , good air movement bilaterally CV- S1, S2 normal, regular Abd-  +ve B.Sounds, Abd Soft, No significant tenderness,    Extremity/Skin:- No significant edema,   good pulses Psych-affect is appropriate, oriented x3 Neuro-no new focal deficits, no tremors    Data Review   CBC w Diff:  Lab Results  Component Value Date   WBC 10.4 04/19/2023   HGB 14.8 04/19/2023   HGB 15.8 11/10/2022   HCT 46.3 04/19/2023   HCT 47.5 11/10/2022   PLT 241 04/19/2023   PLT 252 11/10/2022   LYMPHOPCT 19 04/18/2023   MONOPCT 8 04/18/2023   EOSPCT 1 04/18/2023   BASOPCT 1 04/18/2023    CMP:  Lab Results  Component Value Date   NA 137 04/21/2023   NA 140 10/20/2022   K 3.5 04/21/2023   CL 106 04/21/2023   CO2 22 04/21/2023   BUN 21 (H) 04/21/2023   BUN 15 10/20/2022   CREATININE 0.86 04/21/2023   PROT 6.6 04/21/2023   PROT 8.0 10/20/2022   ALBUMIN 3.1 (L) 04/21/2023   ALBUMIN 4.1 10/20/2022   BILITOT 0.5 04/21/2023   BILITOT 0.4 10/20/2022   ALKPHOS 73 04/21/2023   AST 130 (H) 04/21/2023   ALT 122 (H) 04/21/2023  .  Total Discharge time is about 33 minutes  Shon Hale M.D on 04/21/2023 at 10:20 AM  Go to [email or web address] -  for contact info  [facility] - Office  [phone]

## 2023-04-21 NOTE — Progress Notes (Signed)
Pt continues to refuse HIDA scan, pt is requesting discharge home. MD is aware.

## 2023-04-21 NOTE — Progress Notes (Signed)
[facility] Progress Note     Subjective: Patient seen and examined.  He is sitting comfortably at the side of the bed.  He denies abdominal pain, nausea, and vomiting.  He is again refusing this morning to undergo HIDA scan, and was told he could let the radiology tech know before 11 if he wanted to proceed with the imaging study.  Objective: Vital signs in last 24 hours: Temp:  [98.2 F (36.8 C)-98.7 F (37.1 C)] 98.2 F (36.8 C) (12/02 2010) Pulse Rate:  [97-102] 97 (12/03 0417) Resp:  [18] 18 (12/02 2010) BP: (106-128)/(47-97) 106/47 (12/03 0417) SpO2:  [98 %-100 %] 98 % (12/03 0417) Weight:  [120.3 kg] 120.3 kg (12/03 0417) Last BM Date : 04/19/23  Intake/Output from previous day: 12/02 0701 - 12/03 0700 In: 278.1 [IV Piggyback:278.1] Out: -  Intake/Output this shift: Total I/O In: 360 [P.O.:360] Out: -   General appearance: alert, cooperative, and no distress GI: Abdomen soft, nondistended, no percussion tenderness, nontender to palpation; no rigidity, guarding, rebound tenderness; negative Murphy sign; palpable edge of the liver in the right upper quadrant  Lab Results:  Recent Labs    04/18/23 2030 04/19/23 0326  WBC 10.0 10.4  HGB 15.4 14.8  HCT 48.9 46.3  PLT 268 241   BMET Recent Labs    04/19/23 0326 04/21/23 0412  NA 135 137  K 4.0 3.5  CL 106 106  CO2 18* 22  GLUCOSE 132* 132*  BUN 24* 21*  CREATININE 1.00 0.86  CALCIUM 8.8* 8.7*   PT/INR Recent Labs    04/20/23 1105  LABPROT 16.6*  INR 1.3*    Studies/Results: US Abdomen Limited  Result Date: 04/19/2023 CLINICAL DATA:  [id number] Abdominal pain [id number] EXAM: ULTRASOUND ABDOMEN LIMITED RIGHT UPPER QUADRANT COMPARISON:  CT 04/18/2023 FINDINGS: Gallbladder: Gallbladder wall abnormally thickened measuring 10 mm. Adjacent pericholecystic fluid. Small amount of layering sludge. No shadowing stones. No sonographic Murphy sign noted by sonographer. Common bile duct: Diameter: 3  mm. Liver: No focal lesion identified. Within normal limits in parenchymal echogenicity. Portal vein is patent on color Doppler imaging with normal direction of blood flow towards the liver. Other: None. IMPRESSION: Abnormal gallbladder wall thickening with pericholecystic fluid and sludge. No shadowing stones. Findings are equivocal for acute cholecystitis. If further evaluation is needed, consider nuclear medicine HIDA scan. Electronically Signed   By: Duanne Guess D.O.   On: 04/19/2023 10:53    Anti-infectives: Anti-infectives (From admission, onward)    Start     Dose/Rate Route Frequency Ordered Stop   04/21/23 0000  cefdinir (OMNICEF) 300 MG capsule        300 mg Oral 2 times daily 04/21/23 1005 04/26/23 2359   04/21/23 0000  metroNIDAZOLE (FLAGYL) 500 MG tablet        500 mg Oral 3 times daily 04/21/23 1005 04/26/23 2359   04/19/23 0700  ceFEPIme (MAXIPIME) 2 g in sodium chloride 0.9 % 100 mL IVPB       Note to Pharmacy: Cefepime 2 g IV q8h for CrCl < 60 mL/min   2 g 200 mL/hr over 30 Minutes Intravenous Every 8 hours 04/19/23 0258     04/18/23 2245  ceFEPIme (MAXIPIME) 2 g in sodium chloride 0.9 % 100 mL IVPB        2 g 200 mL/hr over 30 Minutes Intravenous  Once 04/18/23 2241 04/19/23 0053   04/18/23 2245  metroNIDAZOLE (FLAGYL) IVPB 500 mg  500 mg 100 mL/hr over 60 Minutes Intravenous Every 12 hours 04/18/23 2241         Assessment/Plan:  Patient is a 58 year old male who was admitted with concern for acute cholecystitis.  -Patient refused HIDA scan yesterday, but stated that he would undergo the procedure this morning.  Upon evaluation this morning, the patient is still refusing HIDA scan.  We discussed the importance of obtaining a HIDA scan to determine if he has acute cholecystitis.  He said he would think about proceeding with HIDA scan and would let us know by 11 AM -If patient refuses HIDA scan again today, would recommend discharge home with oral antibiotics  for total of 7 days of treatment -IR will only consider percutaneous cholecystostomy tube placement if patient undergoes HIDA scan demonstrates findings concerning for acute cholecystitis -Patient is not a candidate for surgery during this admission with his elevated BNP, troponin, D-dimer, and EF of 35% -If patient does not undergo percutaneous cholecystostomy tube insertion, he needs to return to the emergency department if he begins to have epigastric/right upper quadrant/lower chest pain, nausea, vomiting, jaundice, fevers, or altered mentation -Appreciate hospitalist recommendations   LOS: 3 days    [name] [name] 04/21/2023

## 2023-04-21 NOTE — Discharge Instructions (Addendum)
1)Avoid ibuprofen/Advil/Aleve/Motrin/Goody Powders/Naproxen/BC powders/Meloxicam/Diclofenac/Indomethacin and other Nonsteroidal anti-inflammatory medications as these will make you more likely to bleed and can cause stomach ulcers, can also cause Kidney problems.  2)You have refused further testing/imaging studies for your gallbladder----please call or return to the ER or follow-up with your primary care physician if you have fevers, chills, nausea, vomiting or belly pain or jaundice especially on the right upper part of your belly  3)Please take your medications as prescribed including your antibiotics for your gallbladder  4)Your liver enzymes are high most likely due to gallbladder disease so please hold atorvastatin/Lipitor until your liver enzymes are rechecked in about a week or so

## 2023-04-28 ENCOUNTER — Telehealth: Payer: Self-pay | Admitting: Family Medicine

## 2023-04-28 NOTE — Telephone Encounter (Signed)
Copied from CRM [id number]. Topic: Clinical - Medication Refill >> Apr 28, 2023  9:20 AM Almira Coaster wrote: Most Recent Primary Care Visit:  Provider: Arville Care A  Department: [facility]  Visit Type: OFFICE VISIT  Date: 02/27/2023  Medication: metronidazol 500 & cefdinir 300 mg  Has the patient contacted their pharmacy? Yes (Agent: If no, request that the patient contact the pharmacy for the refill. If patient does not wish to contact the pharmacy document the reason why and proceed with request.) (Agent: If yes, when and what did the pharmacy advise?)  Is this the correct pharmacy for this prescription? Yes If no, delete pharmacy and type the correct one.  This is the patient's preferred pharmacy:  [id number] - [place], [place] - [address] [place] [address] Phone: [phone] Fax: [phone]    Has the prescription been filled recently? Yes, 12/03 after being released from the hospital for a bladder infection.   Is the patient out of the medication? Yes  Has the patient been seen for an appointment in the last year OR does the patient have an upcoming appointment? Yes  Can we respond through MyChart? No, Phone call   Agent: Please be advised that Rx refills may take up to 3 business days. We ask that you follow-up with your pharmacy.

## 2023-04-28 NOTE — Telephone Encounter (Signed)
Left message making pt aware that the medications requested require an appt and especially if he is not any better. Instructed pt to call the office for an appt.

## 2023-04-29 ENCOUNTER — Telehealth: Payer: Self-pay | Admitting: Family Medicine

## 2023-04-29 NOTE — Telephone Encounter (Signed)
REFERRAL REQUEST Telephone Note  Have you been seen at our office for this problem? Pt has an appt on Thursday w/Dettinger (He came in one day early) (Advise that they may need an appointment with their PCP before a referral can be done)  Reason for Referral: Pt was in the [facility] for gallbladder infection & now has leg infection Referral discussed with patient: not here  Best contact number of patient for referral team: [phone]    Has patient been seen by a specialist for this issue before: no  Patient provider preference for referral: Dr Baltazar Najjar Patient location preference for referral: [facility] [address] [place] [phone]   Patient notified that referrals can take up to a week or longer to process. If they haven't heard anything within a week they should call back and speak with the referral department.

## 2023-04-30 ENCOUNTER — Telehealth: Payer: Self-pay | Admitting: *Deleted

## 2023-04-30 ENCOUNTER — Ambulatory Visit: Payer: Medicaid Other | Admitting: Family Medicine

## 2023-04-30 NOTE — Telephone Encounter (Signed)
Received call from patient [phone]~ telephone.   Patient reports that he was not contacted regarding scheduling a hospital follow up appointment. Requested appointment with provider.   Patient reports that he continues to have abdominal pain, nausea, belching, and gas.   Patient has not had completed any further imaging or followed up with IR for possible drainage tube.   Patient would like to proceed with HIDA to determine if surgery is required.   Please advise.

## 2023-04-30 NOTE — Telephone Encounter (Signed)
Please Review

## 2023-05-01 NOTE — Telephone Encounter (Signed)
Discussed case with Dr. Robyne Peers.  Reports that patient is not a surgical candidate due to cardiac tests. Reports that patient will need to be optimized by cardiology before any surgery can be attempted.   Advised that if patient is symptomatic, he may request PCP to order HIDA. If HIDA positive for acute cholecystitis, PCP may refer to IR for evaluation and possible percutaneous chole tube for drainage.   No need for surgical follow up at this time.   Call placed to patient and patient made aware. Agreeable to plan.   Advised if pain worsens to return to ER for further evaluation.

## 2023-05-01 NOTE — Telephone Encounter (Signed)
APPT MADE

## 2023-05-01 NOTE — Telephone Encounter (Signed)
Patient did not show for his appointment yesterday, please get an appointment ASAP so we can discuss this and look at it and get a referral for him.

## 2023-05-04 ENCOUNTER — Emergency Department (HOSPITAL_COMMUNITY): Payer: Medicaid Other

## 2023-05-04 ENCOUNTER — Encounter: Payer: Self-pay | Admitting: Family Medicine

## 2023-05-04 ENCOUNTER — Ambulatory Visit: Payer: Medicaid Other | Admitting: Family Medicine

## 2023-05-04 ENCOUNTER — Encounter (HOSPITAL_COMMUNITY): Payer: Self-pay

## 2023-05-04 ENCOUNTER — Inpatient Hospital Stay (HOSPITAL_COMMUNITY)
Admission: EM | Admit: 2023-05-04 | Discharge: 2023-05-08 | DRG: 606 | Discharge status: Against Medical Advice | Payer: Medicaid Other | Attending: Family Medicine | Admitting: Family Medicine

## 2023-05-04 ENCOUNTER — Other Ambulatory Visit: Payer: Self-pay

## 2023-05-04 VITALS — BP 115/76 | HR 113 | Temp 98.3°F | Ht 77.0 in | Wt 260.0 lb

## 2023-05-04 DIAGNOSIS — E11622 Type 2 diabetes mellitus with other skin ulcer: Secondary | ICD-10-CM

## 2023-05-04 DIAGNOSIS — Z833 Family history of diabetes mellitus: Secondary | ICD-10-CM

## 2023-05-04 DIAGNOSIS — L03115 Cellulitis of right lower limb: Secondary | ICD-10-CM | POA: Diagnosis present

## 2023-05-04 DIAGNOSIS — L089 Local infection of the skin and subcutaneous tissue, unspecified: Secondary | ICD-10-CM | POA: Diagnosis not present

## 2023-05-04 DIAGNOSIS — M19071 Primary osteoarthritis, right ankle and foot: Secondary | ICD-10-CM | POA: Diagnosis not present

## 2023-05-04 DIAGNOSIS — E11628 Type 2 diabetes mellitus with other skin complications: Secondary | ICD-10-CM

## 2023-05-04 DIAGNOSIS — E876 Hypokalemia: Secondary | ICD-10-CM | POA: Diagnosis present

## 2023-05-04 DIAGNOSIS — I513 Intracardiac thrombosis, not elsewhere classified: Secondary | ICD-10-CM | POA: Diagnosis present

## 2023-05-04 DIAGNOSIS — M1711 Unilateral primary osteoarthritis, right knee: Secondary | ICD-10-CM | POA: Diagnosis not present

## 2023-05-04 DIAGNOSIS — Z7984 Long term (current) use of oral hypoglycemic drugs: Secondary | ICD-10-CM

## 2023-05-04 DIAGNOSIS — M79661 Pain in right lower leg: Secondary | ICD-10-CM | POA: Diagnosis not present

## 2023-05-04 DIAGNOSIS — I480 Paroxysmal atrial fibrillation: Secondary | ICD-10-CM | POA: Diagnosis present

## 2023-05-04 DIAGNOSIS — Z8719 Personal history of other diseases of the digestive system: Secondary | ICD-10-CM

## 2023-05-04 DIAGNOSIS — Z811 Family history of alcohol abuse and dependence: Secondary | ICD-10-CM

## 2023-05-04 DIAGNOSIS — L97929 Non-pressure chronic ulcer of unspecified part of left lower leg with unspecified severity: Secondary | ICD-10-CM | POA: Diagnosis present

## 2023-05-04 DIAGNOSIS — Z7982 Long term (current) use of aspirin: Secondary | ICD-10-CM

## 2023-05-04 DIAGNOSIS — L958 Other vasculitis limited to the skin: Principal | ICD-10-CM | POA: Diagnosis present

## 2023-05-04 DIAGNOSIS — I255 Ischemic cardiomyopathy: Secondary | ICD-10-CM | POA: Diagnosis present

## 2023-05-04 DIAGNOSIS — Z5986 Financial insecurity: Secondary | ICD-10-CM

## 2023-05-04 DIAGNOSIS — Z79899 Other long term (current) drug therapy: Secondary | ICD-10-CM

## 2023-05-04 DIAGNOSIS — L03116 Cellulitis of left lower limb: Secondary | ICD-10-CM | POA: Diagnosis not present

## 2023-05-04 DIAGNOSIS — R600 Localized edema: Secondary | ICD-10-CM | POA: Diagnosis not present

## 2023-05-04 DIAGNOSIS — Z86718 Personal history of other venous thrombosis and embolism: Secondary | ICD-10-CM

## 2023-05-04 DIAGNOSIS — K810 Acute cholecystitis: Secondary | ICD-10-CM

## 2023-05-04 DIAGNOSIS — R000 Tachycardia, unspecified: Secondary | ICD-10-CM | POA: Diagnosis not present

## 2023-05-04 DIAGNOSIS — L97909 Non-pressure chronic ulcer of unspecified part of unspecified lower leg with unspecified severity: Secondary | ICD-10-CM

## 2023-05-04 DIAGNOSIS — Z5329 Procedure and treatment not carried out because of patient's decision for other reasons: Secondary | ICD-10-CM | POA: Diagnosis present

## 2023-05-04 DIAGNOSIS — I110 Hypertensive heart disease with heart failure: Secondary | ICD-10-CM | POA: Diagnosis present

## 2023-05-04 DIAGNOSIS — E11621 Type 2 diabetes mellitus with foot ulcer: Secondary | ICD-10-CM | POA: Diagnosis present

## 2023-05-04 DIAGNOSIS — I2510 Atherosclerotic heart disease of native coronary artery without angina pectoris: Secondary | ICD-10-CM

## 2023-05-04 DIAGNOSIS — E1165 Type 2 diabetes mellitus with hyperglycemia: Secondary | ICD-10-CM | POA: Diagnosis present

## 2023-05-04 DIAGNOSIS — L97919 Non-pressure chronic ulcer of unspecified part of right lower leg with unspecified severity: Secondary | ICD-10-CM | POA: Diagnosis present

## 2023-05-04 DIAGNOSIS — Z803 Family history of malignant neoplasm of breast: Secondary | ICD-10-CM

## 2023-05-04 DIAGNOSIS — I420 Dilated cardiomyopathy: Secondary | ICD-10-CM | POA: Diagnosis present

## 2023-05-04 DIAGNOSIS — Z823 Family history of stroke: Secondary | ICD-10-CM

## 2023-05-04 DIAGNOSIS — Z955 Presence of coronary angioplasty implant and graft: Secondary | ICD-10-CM

## 2023-05-04 DIAGNOSIS — E785 Hyperlipidemia, unspecified: Secondary | ICD-10-CM | POA: Diagnosis present

## 2023-05-04 DIAGNOSIS — F1410 Cocaine abuse, uncomplicated: Secondary | ICD-10-CM | POA: Diagnosis present

## 2023-05-04 DIAGNOSIS — I10 Essential (primary) hypertension: Secondary | ICD-10-CM | POA: Diagnosis present

## 2023-05-04 DIAGNOSIS — M19072 Primary osteoarthritis, left ankle and foot: Secondary | ICD-10-CM | POA: Diagnosis not present

## 2023-05-04 DIAGNOSIS — L03119 Cellulitis of unspecified part of limb: Secondary | ICD-10-CM

## 2023-05-04 DIAGNOSIS — I776 Arteritis, unspecified: Principal | ICD-10-CM

## 2023-05-04 DIAGNOSIS — I5043 Acute on chronic combined systolic (congestive) and diastolic (congestive) heart failure: Secondary | ICD-10-CM | POA: Diagnosis present

## 2023-05-04 DIAGNOSIS — Z6830 Body mass index (BMI) 30.0-30.9, adult: Secondary | ICD-10-CM

## 2023-05-04 DIAGNOSIS — S81802A Unspecified open wound, left lower leg, initial encounter: Secondary | ICD-10-CM | POA: Diagnosis not present

## 2023-05-04 DIAGNOSIS — M1712 Unilateral primary osteoarthritis, left knee: Secondary | ICD-10-CM | POA: Diagnosis not present

## 2023-05-04 DIAGNOSIS — I509 Heart failure, unspecified: Secondary | ICD-10-CM

## 2023-05-04 DIAGNOSIS — I252 Old myocardial infarction: Secondary | ICD-10-CM

## 2023-05-04 DIAGNOSIS — M109 Gout, unspecified: Secondary | ICD-10-CM | POA: Diagnosis present

## 2023-05-04 DIAGNOSIS — I5023 Acute on chronic systolic (congestive) heart failure: Secondary | ICD-10-CM | POA: Insufficient documentation

## 2023-05-04 DIAGNOSIS — Z8249 Family history of ischemic heart disease and other diseases of the circulatory system: Secondary | ICD-10-CM

## 2023-05-04 DIAGNOSIS — I959 Hypotension, unspecified: Secondary | ICD-10-CM | POA: Diagnosis present

## 2023-05-04 LAB — COMPREHENSIVE METABOLIC PANEL
ALT: 28 U/L (ref 0–44)
AST: 33 U/L (ref 15–41)
Albumin: 3.4 g/dL — ABNORMAL LOW (ref 3.5–5.0)
Alkaline Phosphatase: 79 U/L (ref 38–126)
Anion gap: 11 (ref 5–15)
BUN: 20 mg/dL (ref 6–20)
CO2: 20 mmol/L — ABNORMAL LOW (ref 22–32)
Calcium: 8.5 mg/dL — ABNORMAL LOW (ref 8.9–10.3)
Chloride: 101 mmol/L (ref 98–111)
Creatinine, Ser: 1.22 mg/dL (ref 0.61–1.24)
GFR, Estimated: >60 mL/min (ref >60)
Glucose, Bld: 182 mg/dL — ABNORMAL HIGH (ref 70–99)
Potassium: 3.7 mmol/L (ref 3.5–5.1)
Sodium: 132 mmol/L — ABNORMAL LOW (ref 135–145)
Total Bilirubin: 1 mg/dL (ref <1.2)
Total Protein: 7.1 g/dL (ref 6.5–8.1)

## 2023-05-04 LAB — CBC WITH DIFFERENTIAL/PLATELET
Abs Immature Granulocytes: 0.02 10*3/uL (ref 0.00–0.07)
Basophils Absolute: 0.1 10*3/uL (ref 0.0–0.1)
Basophils Relative: 1 %
Eosinophils Absolute: 0.1 10*3/uL (ref 0.0–0.5)
Eosinophils Relative: 1 %
HCT: 46.3 % (ref 39.0–52.0)
Hemoglobin: 15.2 g/dL (ref 13.0–17.0)
Immature Granulocytes: 0 %
Lymphocytes Relative: 19 %
Lymphs Abs: 2 10*3/uL (ref 0.7–4.0)
MCH: 28.5 pg (ref 26.0–34.0)
MCHC: 32.8 g/dL (ref 30.0–36.0)
MCV: 86.9 fL (ref 80.0–100.0)
Monocytes Absolute: 1 10*3/uL (ref 0.1–1.0)
Monocytes Relative: 9 %
Neutro Abs: 7.1 10*3/uL (ref 1.7–7.7)
Neutrophils Relative %: 70 %
Platelets: 312 10*3/uL (ref 150–400)
RBC: 5.33 MIL/uL (ref 4.22–5.81)
RDW: 14.5 % (ref 11.5–15.5)
WBC: 10.3 10*3/uL (ref 4.0–10.5)
nRBC: 0 % (ref 0.0–0.2)

## 2023-05-04 LAB — I-STAT CG4 LACTIC ACID, ED: Lactic Acid, Venous: 1.7 mmol/L (ref 0.5–1.9)

## 2023-05-04 NOTE — Progress Notes (Signed)
BP 115/76   Pulse (!) 113   Temp 98.3 F (36.8 C)   Ht 6\' 5"  (1.956 m)   Wt 260 lb (117.9 kg)   SpO2 97%   BMI 30.83 kg/m    Subjective:   Patient ID: DREVAN ASBILL, male    DOB: [birth year], 57 y.o.   MRN: [id number]  HPI: ULUS POTTORF is a 57 y.o. male presenting on 05/04/2023 for Wound Infection, Edema (BLE), and Heart Problem (Heart rate up/down)   HPI Hospital follow-up Patient was in the hospital on 04/18/2023 and discharged on 04/21/2023.  He was diagnosed with acute cholecystitis and was started on a antibiotics for that to reduce inflammation and infection.  He has been taking cefdinir and metronidazole for this.  He finished the antibiotic for his gallbladder that he was taking about a week ago and he says his abdominal pain and his bowel movements have gone mostly back to normal so that is feeling better.  Diabetic leg wounds Patient had developed some sores about a month ago on his lower extremities and over the past week they have become larger and infected and having purulent drainage.  Especially some large ones on his left lower extremity on the calf.  He has been feeling a lot more pain in his lower extremities and having more difficulty walking with them.  His blood sugars have also been somewhat out of control with all of this.  He thinks that initially these sores came up when he started Ozempic but then he also thinks that he may had some mites that may have attacked him that causes as well.  He says they are just not getting any better.  He denies any fevers or chills today but he does feel off and feels like his energy is down and feels drained and does not feel well.  His heart rate has been up and down and today he is tachycardic and feeling his heart rate somewhat.  During the hospital his liver enzymes were elevated so it was likely due to acute cholecystitis.  Patient requires cardiac clearance prior to cholecystectomy  Relevant past medical, surgical,  family and social history reviewed and updated as indicated. Interim medical history since our last visit reviewed. Allergies and medications reviewed and updated.  Review of Systems  Constitutional:  Positive for fatigue. Negative for chills and fever.  Eyes:  Negative for visual disturbance.  Respiratory:  Negative for shortness of breath and wheezing.   Cardiovascular:  Negative for chest pain and leg swelling.  Gastrointestinal:  Negative for abdominal distention, abdominal pain, anal bleeding and blood in stool.  Musculoskeletal:  Negative for back pain and gait problem.  Skin:  Positive for color change, rash and wound.  Neurological:  Positive for light-headedness. Negative for dizziness and weakness.  All other systems reviewed and are negative.   Per HPI unless specifically indicated above   Allergies as of 05/04/2023   No Known Allergies      Medication List        Accurate as of May 04, 2023  4:26 PM. If you have any questions, ask your nurse or doctor.          STOP taking these medications    Semaglutide(0.25 or 0.5MG /DOS) 2 MG/3ML Sopn Stopped by: Elige Radon Huong Luthi       TAKE these medications    Accu-Chek Aviva device Test BS BID Dx E11.69   Accu-Chek Aviva Plus w/Device Kit 1 each by Does  not apply route 4 (four) times daily.   Accu-Chek Guide test strip Generic drug: glucose blood Check BS in the morning and at bedtime Dx E11.8   acetaminophen 325 MG tablet Commonly known as: TYLENOL Take 2 tablets (650 mg total) by mouth every 6 (six) hours as needed for mild pain (pain score 1-3) or fever (or Fever >/= 101).   aspirin EC 81 MG tablet Take 1 tablet (81 mg total) by mouth daily with breakfast. Swallow whole.   atorvastatin 80 MG tablet Commonly known as: LIPITOR Take 1 tablet (80 mg total) by mouth daily.   BD Pen Needle Nano 2nd Gen 32G X 4 MM Misc Generic drug: Insulin Pen Needle Use daily with insulin Dx E11.9, Z79.4    empagliflozin 25 MG Tabs tablet Commonly known as: Jardiance Take 1 tablet (25 mg total) by mouth daily.   famotidine 20 MG tablet Commonly known as: Pepcid Take 1 tablet (20 mg total) by mouth 2 (two) times daily.   fenofibrate 145 MG tablet Commonly known as: TRICOR Take 1 tablet (145 mg total) by mouth daily.   Fish Oil 1000 MG Caps Take 2,000 mg by mouth every other day.   furosemide 20 MG tablet Commonly known as: LASIX Take 1 tablet (20 mg total) by mouth 2 (two) times daily.   losartan 25 MG tablet Commonly known as: COZAAR Take 1 tablet (25 mg total) by mouth daily. What changed: when to take this   metoprolol succinate 25 MG 24 hr tablet Commonly known as: Toprol XL Take 1 tablet (25 mg total) by mouth daily.   spironolactone 25 MG tablet Commonly known as: ALDACTONE Take 0.5 tablets (12.5 mg total) by mouth daily.         Objective:   BP 115/76   Pulse (!) 113   Temp 98.3 F (36.8 C)   Ht 6\' 5"  (1.956 m)   Wt 260 lb (117.9 kg)   SpO2 97%   BMI 30.83 kg/m   Wt Readings from Last 3 Encounters:  05/04/23 260 lb (117.9 kg)  04/21/23 265 lb 3.4 oz (120.3 kg)  04/13/23 258 lb 12.8 oz (117.4 kg)    Physical Exam Vitals and nursing note reviewed.  Constitutional:      General: He is not in acute distress.    Appearance: He is well-developed. He is not diaphoretic.  Eyes:     General: No scleral icterus.    Conjunctiva/sclera: Conjunctivae normal.  Neck:     Thyroid: No thyromegaly.  Cardiovascular:     Rate and Rhythm: Normal rate and regular rhythm.     Heart sounds: Normal heart sounds. No murmur heard. Pulmonary:     Effort: Pulmonary effort is normal. No respiratory distress.     Breath sounds: Normal breath sounds. No wheezing.  Musculoskeletal:        General: Normal range of motion.     Cervical back: Neck supple.  Lymphadenopathy:     Cervical: No cervical adenopathy.  Skin:    General: Skin is warm and dry.     Findings: Wound  present. No rash.       Neurological:     Mental Status: He is alert and oriented to person, place, and time.     Coordination: Coordination normal.  Psychiatric:        Behavior: Behavior normal.       Assessment & Plan:   Problem List Items Addressed This Visit       Digestive  Acute cholecystitis   Other Visit Diagnoses       Diabetic leg ulcer (HCC)    -  Primary       Sounds like the acute cholecystitis is doing better and is calm down is not having abdominal pain  Heart rate here in the office went between 24 and 113 and he is feeling fatigued and has these ulcerated sores that look infected on both of his lower extremities and the concern is that he is going to lose his lower extremities if we do not do something ASAP and recommended that he go to the emergency department so we can get him in with wound care as soon as possible.  Called ahead to the emergency department so that they would know he was coming and for what reason.  Likely needs IV antibiotics as well and also needs imaging to rule out osteomyelitis. Follow up plan: Return if symptoms worsen or fail to improve.  Counseling provided for all of the vaccine components No orders of the defined types were placed in this encounter.   Arville Care, MD [facility] 05/04/2023, 4:26 PM

## 2023-05-04 NOTE — ED Notes (Signed)
Followed up regarding pending I stat lactic- redrawn and sent to I stat lab for results.

## 2023-05-04 NOTE — ED Triage Notes (Signed)
Pt sent by PCP for septic evaluation. Pt has multiple diabetic wounds to both lower legs with redness and pain. Drainage noted.

## 2023-05-04 NOTE — ED Provider Triage Note (Signed)
Emergency Medicine Provider Triage Evaluation Note  Evan [name] , a 57 y.o. male  was evaluated in triage.  Pt complains of diabetic leg wounds. States that same has been ongoing x 2 months, worsening in the last week. Reportedly patient has been more confused in the last week as well. Went to pcp and was told to come here with concern for sepsis. No fevers or chills.  Review of Systems  Positive:  Negative:   Physical Exam  BP 110/82   Pulse (!) 108   Temp 97.7 F (36.5 C) (Oral)   Resp (!) 23   SpO2 98%  Gen:   Awake, no distress   Resp:  Normal effort  MSK:   Moves extremities without difficulty  Other:  Leg wounds with erythema and warmth, purulent drainage from left lower leg wound     Medical Decision Making  Medically screening exam initiated at 7:03 PM.  Appropriate orders placed.  Evan [name] was informed that the remainder of the evaluation will be completed by another provider, this initial triage assessment does not replace that evaluation, and the importance of remaining in the ED until their evaluation is complete.  Work up initiated   Vear Clock 05/04/23 1906

## 2023-05-05 ENCOUNTER — Other Ambulatory Visit (HOSPITAL_COMMUNITY): Payer: Self-pay

## 2023-05-05 ENCOUNTER — Telehealth (HOSPITAL_COMMUNITY): Payer: Self-pay | Admitting: Pharmacy Technician

## 2023-05-05 ENCOUNTER — Inpatient Hospital Stay (HOSPITAL_COMMUNITY): Payer: Medicaid Other

## 2023-05-05 ENCOUNTER — Encounter: Payer: Self-pay | Admitting: Family Medicine

## 2023-05-05 DIAGNOSIS — L03119 Cellulitis of unspecified part of limb: Secondary | ICD-10-CM

## 2023-05-05 DIAGNOSIS — M1711 Unilateral primary osteoarthritis, right knee: Secondary | ICD-10-CM | POA: Diagnosis not present

## 2023-05-05 DIAGNOSIS — I5043 Acute on chronic combined systolic (congestive) and diastolic (congestive) heart failure: Secondary | ICD-10-CM | POA: Diagnosis not present

## 2023-05-05 DIAGNOSIS — Z5329 Procedure and treatment not carried out because of patient's decision for other reasons: Secondary | ICD-10-CM | POA: Diagnosis not present

## 2023-05-05 DIAGNOSIS — L97919 Non-pressure chronic ulcer of unspecified part of right lower leg with unspecified severity: Secondary | ICD-10-CM | POA: Diagnosis present

## 2023-05-05 DIAGNOSIS — Z8249 Family history of ischemic heart disease and other diseases of the circulatory system: Secondary | ICD-10-CM | POA: Diagnosis not present

## 2023-05-05 DIAGNOSIS — I509 Heart failure, unspecified: Secondary | ICD-10-CM

## 2023-05-05 DIAGNOSIS — M79606 Pain in leg, unspecified: Secondary | ICD-10-CM | POA: Diagnosis not present

## 2023-05-05 DIAGNOSIS — I10 Essential (primary) hypertension: Secondary | ICD-10-CM

## 2023-05-05 DIAGNOSIS — I5023 Acute on chronic systolic (congestive) heart failure: Secondary | ICD-10-CM | POA: Insufficient documentation

## 2023-05-05 DIAGNOSIS — E11628 Type 2 diabetes mellitus with other skin complications: Secondary | ICD-10-CM | POA: Diagnosis not present

## 2023-05-05 DIAGNOSIS — I959 Hypotension, unspecified: Secondary | ICD-10-CM | POA: Diagnosis not present

## 2023-05-05 DIAGNOSIS — E1165 Type 2 diabetes mellitus with hyperglycemia: Secondary | ICD-10-CM | POA: Diagnosis not present

## 2023-05-05 DIAGNOSIS — L97909 Non-pressure chronic ulcer of unspecified part of unspecified lower leg with unspecified severity: Secondary | ICD-10-CM | POA: Insufficient documentation

## 2023-05-05 DIAGNOSIS — I420 Dilated cardiomyopathy: Secondary | ICD-10-CM | POA: Diagnosis not present

## 2023-05-05 DIAGNOSIS — L958 Other vasculitis limited to the skin: Secondary | ICD-10-CM | POA: Diagnosis not present

## 2023-05-05 DIAGNOSIS — R7989 Other specified abnormal findings of blood chemistry: Secondary | ICD-10-CM

## 2023-05-05 DIAGNOSIS — I480 Paroxysmal atrial fibrillation: Secondary | ICD-10-CM | POA: Diagnosis not present

## 2023-05-05 DIAGNOSIS — I513 Intracardiac thrombosis, not elsewhere classified: Secondary | ICD-10-CM | POA: Diagnosis not present

## 2023-05-05 DIAGNOSIS — I2510 Atherosclerotic heart disease of native coronary artery without angina pectoris: Secondary | ICD-10-CM | POA: Diagnosis not present

## 2023-05-05 DIAGNOSIS — I776 Arteritis, unspecified: Secondary | ICD-10-CM | POA: Diagnosis not present

## 2023-05-05 DIAGNOSIS — Z9861 Coronary angioplasty status: Secondary | ICD-10-CM | POA: Diagnosis not present

## 2023-05-05 DIAGNOSIS — E785 Hyperlipidemia, unspecified: Secondary | ICD-10-CM | POA: Diagnosis not present

## 2023-05-05 DIAGNOSIS — L03116 Cellulitis of left lower limb: Secondary | ICD-10-CM | POA: Diagnosis not present

## 2023-05-05 DIAGNOSIS — F1410 Cocaine abuse, uncomplicated: Secondary | ICD-10-CM | POA: Diagnosis present

## 2023-05-05 DIAGNOSIS — M1712 Unilateral primary osteoarthritis, left knee: Secondary | ICD-10-CM | POA: Diagnosis not present

## 2023-05-05 DIAGNOSIS — Z8719 Personal history of other diseases of the digestive system: Secondary | ICD-10-CM

## 2023-05-05 DIAGNOSIS — Z955 Presence of coronary angioplasty implant and graft: Secondary | ICD-10-CM | POA: Diagnosis not present

## 2023-05-05 DIAGNOSIS — Z7984 Long term (current) use of oral hypoglycemic drugs: Secondary | ICD-10-CM | POA: Diagnosis not present

## 2023-05-05 DIAGNOSIS — I110 Hypertensive heart disease with heart failure: Secondary | ICD-10-CM | POA: Diagnosis not present

## 2023-05-05 DIAGNOSIS — M79661 Pain in right lower leg: Secondary | ICD-10-CM | POA: Diagnosis not present

## 2023-05-05 DIAGNOSIS — L03115 Cellulitis of right lower limb: Secondary | ICD-10-CM | POA: Diagnosis not present

## 2023-05-05 DIAGNOSIS — L97929 Non-pressure chronic ulcer of unspecified part of left lower leg with unspecified severity: Secondary | ICD-10-CM | POA: Diagnosis not present

## 2023-05-05 DIAGNOSIS — E11622 Type 2 diabetes mellitus with other skin ulcer: Secondary | ICD-10-CM | POA: Insufficient documentation

## 2023-05-05 DIAGNOSIS — L089 Local infection of the skin and subcutaneous tissue, unspecified: Secondary | ICD-10-CM | POA: Diagnosis not present

## 2023-05-05 DIAGNOSIS — R600 Localized edema: Secondary | ICD-10-CM | POA: Diagnosis not present

## 2023-05-05 DIAGNOSIS — S81802A Unspecified open wound, left lower leg, initial encounter: Secondary | ICD-10-CM | POA: Diagnosis not present

## 2023-05-05 DIAGNOSIS — E11621 Type 2 diabetes mellitus with foot ulcer: Secondary | ICD-10-CM | POA: Diagnosis present

## 2023-05-05 DIAGNOSIS — E876 Hypokalemia: Secondary | ICD-10-CM | POA: Diagnosis not present

## 2023-05-05 DIAGNOSIS — M19071 Primary osteoarthritis, right ankle and foot: Secondary | ICD-10-CM | POA: Diagnosis not present

## 2023-05-05 DIAGNOSIS — Z86718 Personal history of other venous thrombosis and embolism: Secondary | ICD-10-CM | POA: Diagnosis not present

## 2023-05-05 DIAGNOSIS — I252 Old myocardial infarction: Secondary | ICD-10-CM | POA: Diagnosis not present

## 2023-05-05 DIAGNOSIS — R000 Tachycardia, unspecified: Secondary | ICD-10-CM | POA: Diagnosis not present

## 2023-05-05 DIAGNOSIS — M19072 Primary osteoarthritis, left ankle and foot: Secondary | ICD-10-CM | POA: Diagnosis not present

## 2023-05-05 LAB — HIV ANTIBODY (ROUTINE TESTING W REFLEX): HIV Screen 4th Generation wRfx: NONREACTIVE

## 2023-05-05 LAB — BRAIN NATRIURETIC PEPTIDE: B Natriuretic Peptide: 1045.5 pg/mL — ABNORMAL HIGH (ref 0.0–100.0)

## 2023-05-05 LAB — RAPID URINE DRUG SCREEN, HOSP PERFORMED
Amphetamines: POSITIVE — AB
Barbiturates: NOT DETECTED
Benzodiazepines: NOT DETECTED
Cocaine: POSITIVE — AB
Opiates: POSITIVE — AB
Tetrahydrocannabinol: NOT DETECTED

## 2023-05-05 LAB — HEPATITIS PANEL, ACUTE
HCV Ab: NONREACTIVE
Hep A IgM: NONREACTIVE
Hep B C IgM: NONREACTIVE
Hepatitis B Surface Ag: NONREACTIVE

## 2023-05-05 LAB — ECHOCARDIOGRAM COMPLETE
AR max vel: 2.05 cm2
AV Area VTI: 2.32 cm2
AV Area mean vel: 2.03 cm2
AV Mean grad: 3 mm[Hg]
AV Peak grad: 4.5 mm[Hg]
Ao pk vel: 1.06 m/s
Area-P 1/2: 6.17 cm2
Est EF: 20
Height: 77 in
S' Lateral: 5.8 cm
Weight: 4160 [oz_av]

## 2023-05-05 LAB — SEDIMENTATION RATE
Sed Rate: 1 mm/h (ref 0–16)
Sed Rate: 3 mm/h (ref 0–16)

## 2023-05-05 LAB — VAS US ABI WITH/WO TBI: Right ABI: 1.1

## 2023-05-05 LAB — CBG MONITORING, ED
Glucose-Capillary: 114 mg/dL — ABNORMAL HIGH (ref 70–99)
Glucose-Capillary: 153 mg/dL — ABNORMAL HIGH (ref 70–99)
Glucose-Capillary: 159 mg/dL — ABNORMAL HIGH (ref 70–99)
Glucose-Capillary: 172 mg/dL — ABNORMAL HIGH (ref 70–99)
Glucose-Capillary: 196 mg/dL — ABNORMAL HIGH (ref 70–99)
Glucose-Capillary: 200 mg/dL — ABNORMAL HIGH (ref 70–99)

## 2023-05-05 LAB — D-DIMER, QUANTITATIVE: D-Dimer, Quant: 1.73 ug{FEU}/mL — ABNORMAL HIGH (ref 0.00–0.50)

## 2023-05-05 LAB — PREALBUMIN: Prealbumin: 12 mg/dL — ABNORMAL LOW (ref 18–38)

## 2023-05-05 LAB — C-REACTIVE PROTEIN
CRP: 2.5 mg/dL — ABNORMAL HIGH (ref <1.0)
CRP: 2 mg/dL — ABNORMAL HIGH (ref <1.0)

## 2023-05-05 LAB — HEPARIN LEVEL (UNFRACTIONATED): Heparin Unfractionated: 0.41 [IU]/mL (ref 0.30–0.70)

## 2023-05-05 MED ORDER — ACETAMINOPHEN 325 MG PO TABS
650.0000 mg | ORAL_TABLET | Freq: Four times a day (QID) | ORAL | Status: DC | PRN
  Administered 2023-05-08: 650 mg via ORAL
  Filled 2023-05-05: qty 2

## 2023-05-05 MED ORDER — SPIRONOLACTONE 12.5 MG HALF TABLET
12.5000 mg | ORAL_TABLET | Freq: Every day | ORAL | Status: DC
  Administered 2023-05-05 – 2023-05-08 (×4): 12.5 mg via ORAL
  Filled 2023-05-05 (×4): qty 1

## 2023-05-05 MED ORDER — FAMOTIDINE 20 MG PO TABS
20.0000 mg | ORAL_TABLET | Freq: Two times a day (BID) | ORAL | Status: DC
  Administered 2023-05-05 – 2023-05-08 (×8): 20 mg via ORAL
  Filled 2023-05-05 (×8): qty 1

## 2023-05-05 MED ORDER — ATORVASTATIN CALCIUM 40 MG PO TABS
80.0000 mg | ORAL_TABLET | Freq: Every day | ORAL | Status: DC
  Administered 2023-05-05 – 2023-05-08 (×4): 80 mg via ORAL
  Filled 2023-05-05 (×4): qty 2

## 2023-05-05 MED ORDER — FENOFIBRATE 54 MG PO TABS
54.0000 mg | ORAL_TABLET | Freq: Every day | ORAL | Status: DC
  Administered 2023-05-05 – 2023-05-08 (×4): 54 mg via ORAL
  Filled 2023-05-05 (×4): qty 1

## 2023-05-05 MED ORDER — SODIUM CHLORIDE 0.9 % IV SOLN
2.0000 g | Freq: Once | INTRAVENOUS | Status: AC
  Administered 2023-05-05: 2 g via INTRAVENOUS
  Filled 2023-05-05: qty 12.5

## 2023-05-05 MED ORDER — FUROSEMIDE 40 MG PO TABS: 20.0000 mg | ORAL_TABLET | Freq: Two times a day (BID) | ORAL | Status: DC

## 2023-05-05 MED ORDER — HEPARIN BOLUS VIA INFUSION
5000.0000 [IU] | Freq: Once | INTRAVENOUS | Status: AC
Start: 2023-05-05 — End: 2023-05-05
  Administered 2023-05-05: 5000 [IU] via INTRAVENOUS
  Filled 2023-05-05: qty 5000

## 2023-05-05 MED ORDER — PERFLUTREN LIPID MICROSPHERE
1.0000 mL | INTRAVENOUS | Status: AC | PRN
  Administered 2023-05-05: 2 mL via INTRAVENOUS

## 2023-05-05 MED ORDER — MORPHINE SULFATE (PF) 2 MG/ML IV SOLN: 2.0000 mg | INTRAVENOUS | Status: DC | PRN

## 2023-05-05 MED ORDER — EMPAGLIFLOZIN 25 MG PO TABS
25.0000 mg | ORAL_TABLET | Freq: Every day | ORAL | Status: DC
  Administered 2023-05-05 – 2023-05-08 (×4): 25 mg via ORAL
  Filled 2023-05-05 (×4): qty 1

## 2023-05-05 MED ORDER — HEPARIN (PORCINE) 25000 UT/250ML-% IV SOLN
2000.0000 [IU]/h | INTRAVENOUS | Status: DC
  Administered 2023-05-05 – 2023-05-07 (×4): 2000 [IU]/h via INTRAVENOUS
  Filled 2023-05-05 (×4): qty 250

## 2023-05-05 MED ORDER — VANCOMYCIN HCL 1.5 G IV SOLR
1500.0000 mg | Freq: Two times a day (BID) | INTRAVENOUS | Status: DC
  Filled 2023-05-05: qty 30

## 2023-05-05 MED ORDER — SODIUM CHLORIDE 0.9 % IV SOLN
2.0000 g | Freq: Three times a day (TID) | INTRAVENOUS | Status: DC
  Administered 2023-05-05: 2 g via INTRAVENOUS
  Filled 2023-05-05: qty 12.5

## 2023-05-05 MED ORDER — METRONIDAZOLE 500 MG PO TABS
500.0000 mg | ORAL_TABLET | Freq: Two times a day (BID) | ORAL | Status: DC
  Administered 2023-05-05 (×2): 500 mg via ORAL
  Filled 2023-05-05 (×2): qty 1

## 2023-05-05 MED ORDER — ONDANSETRON HCL 4 MG PO TABS: 4.0000 mg | ORAL_TABLET | Freq: Four times a day (QID) | ORAL | Status: DC | PRN

## 2023-05-05 MED ORDER — HYDROCODONE-ACETAMINOPHEN 5-325 MG PO TABS
1.0000 | ORAL_TABLET | ORAL | Status: DC | PRN
Start: 2023-05-05 — End: 2023-05-08
  Administered 2023-05-05 – 2023-05-06 (×2): 2 via ORAL
  Administered 2023-05-07: 1 via ORAL
  Filled 2023-05-05: qty 1
  Filled 2023-05-05 (×2): qty 2

## 2023-05-05 MED ORDER — METOPROLOL TARTRATE 25 MG PO TABS: 12.5000 mg | ORAL_TABLET | Freq: Two times a day (BID) | ORAL | Status: DC

## 2023-05-05 MED ORDER — FUROSEMIDE 10 MG/ML IJ SOLN
40.0000 mg | Freq: Two times a day (BID) | INTRAMUSCULAR | Status: DC
  Administered 2023-05-05 – 2023-05-06 (×3): 40 mg via INTRAVENOUS
  Filled 2023-05-05 (×3): qty 4

## 2023-05-05 MED ORDER — ONDANSETRON HCL 4 MG/2ML IJ SOLN: 4.0000 mg | Freq: Four times a day (QID) | INTRAMUSCULAR | Status: DC | PRN

## 2023-05-05 MED ORDER — ASPIRIN 81 MG PO TBEC
81.0000 mg | DELAYED_RELEASE_TABLET | Freq: Every day | ORAL | Status: DC
  Administered 2023-05-05 – 2023-05-08 (×4): 81 mg via ORAL
  Filled 2023-05-05 (×4): qty 1

## 2023-05-05 MED ORDER — VANCOMYCIN HCL 2000 MG/400ML IV SOLN
2000.0000 mg | Freq: Once | INTRAVENOUS | Status: AC
  Administered 2023-05-05: 2000 mg via INTRAVENOUS
  Filled 2023-05-05: qty 400

## 2023-05-05 MED ORDER — HYDROCODONE-ACETAMINOPHEN 5-325 MG PO TABS
1.0000 | ORAL_TABLET | Freq: Once | ORAL | Status: AC
  Administered 2023-05-05: 1 via ORAL
  Filled 2023-05-05 (×2): qty 1

## 2023-05-05 MED ORDER — INSULIN ASPART 100 UNIT/ML IJ SOLN
0.0000 [IU] | INTRAMUSCULAR | Status: DC
  Administered 2023-05-05 (×2): 3 [IU] via SUBCUTANEOUS
  Administered 2023-05-05 – 2023-05-06 (×2): 2 [IU] via SUBCUTANEOUS
  Administered 2023-05-06 – 2023-05-07 (×4): 3 [IU] via SUBCUTANEOUS
  Administered 2023-05-07 (×2): 2 [IU] via SUBCUTANEOUS
  Administered 2023-05-07 (×2): 3 [IU] via SUBCUTANEOUS
  Administered 2023-05-07: 2 [IU] via SUBCUTANEOUS
  Administered 2023-05-07: 3 [IU] via SUBCUTANEOUS
  Administered 2023-05-08: 5 [IU] via SUBCUTANEOUS
  Administered 2023-05-08: 2 [IU] via SUBCUTANEOUS
  Filled 2023-05-05: qty 0.15

## 2023-05-05 MED ORDER — ACETAMINOPHEN 650 MG RE SUPP: 650.0000 mg | Freq: Four times a day (QID) | RECTAL | Status: DC | PRN

## 2023-05-05 MED ORDER — LOSARTAN POTASSIUM 25 MG PO TABS: 25.0000 mg | ORAL_TABLET | Freq: Every day | ORAL | Status: DC

## 2023-05-05 NOTE — ED Provider Notes (Signed)
[place] [facility]ENC[facility] DEPARTMENT AT [facility] Provider Note   CSN: [id number] Arrival date & time: 05/04/23  1752     History  Chief Complaint  Patient presents with   Wound Infection    Evan [name] is a 57 y.o. male.  The history is provided by the patient and medical records.  Evan [name] is a 57 y.o. male who presents to the Emergency Department complaining of leg wounds.  He presents the emergency department upon referral from his PCP for evaluation of wounds to bilateral lower extremities.  He was discharged from the hospital on December 3 after admission for cholecystitis that was treated on 5 days of oral antibiotics.  Since hospital discharge he has not had any fever or chills but has experienced nausea.  He does not have any vomiting or abdominal pain.  He has a chronic leg wounds to bilateral legs that have been looking worse and draining a little bit for a few months.  Today they drained a lot more after they were assessed in the office.  He does have pain to bilateral legs.  Daughters at the bedside states that the wounds have significantly worsened since hospital discharge.  He is compliant with all of his home medications.  No tobacco, alcohol, drug use.      Home Medications Prior to Admission medications   Medication Sig Start Date End Date Taking? Authorizing Provider  acetaminophen (TYLENOL) 325 MG tablet Take 2 tablets (650 mg total) by mouth every 6 (six) hours as needed for mild pain (pain score 1-3) or fever (or Fever >/= 101). 04/21/23  Yes Shon Hale, MD  aspirin EC 81 MG tablet Take 1 tablet (81 mg total) by mouth daily with breakfast. Swallow whole. 04/22/23  Yes Emokpae, Courage, MD  empagliflozin (JARDIANCE) 25 MG TABS tablet Take 1 tablet (25 mg total) by mouth daily. 08/07/22  Yes Dettinger, Elige Radon, MD  famotidine (PEPCID) 20 MG tablet Take 1 tablet (20 mg total) by mouth 2 (two) times daily. 04/21/23  Yes Emokpae, Courage, MD   fenofibrate (TRICOR) 145 MG tablet Take 1 tablet (145 mg total) by mouth daily. 08/07/22  Yes Dettinger, Elige Radon, MD  furosemide (LASIX) 20 MG tablet Take 1 tablet (20 mg total) by mouth 2 (two) times daily. 04/21/23  Yes Emokpae, Courage, MD  losartan (COZAAR) 25 MG tablet Take 1 tablet (25 mg total) by mouth daily. Patient taking differently: Take 25 mg by mouth at bedtime. 08/07/22  Yes Dettinger, Elige Radon, MD  Omega-3 Fatty Acids (FISH OIL) 1000 MG CAPS Take 2,000 mg by mouth every other day.   Yes [provider]  pantoprazole (PROTONIX) 40 MG tablet Take 40 mg by mouth daily. 04/21/23  Yes [provider]  spironolactone (ALDACTONE) 25 MG tablet Take 0.5 tablets (12.5 mg total) by mouth daily. 04/21/23  Yes Emokpae, Courage, MD  atorvastatin (LIPITOR) 80 MG tablet Take 1 tablet (80 mg total) by mouth daily. Patient not taking: Reported on 05/05/2023 04/21/23   Shon Hale, MD  BD PEN NEEDLE NANO 2ND GEN 32G X 4 MM MISC Use daily with insulin Dx E11.9, Z79.4 06/22/20   Dettinger, Elige Radon, MD  Blood Glucose Monitoring Suppl (ACCU-CHEK AVIVA PLUS) w/Device KIT 1 each by Does not apply route 4 (four) times daily. 06/17/21   Dettinger, Elige Radon, MD  Blood Glucose Monitoring Suppl [facility] AVIVA) device Test BS BID Dx E11.69 05/24/20   Dettinger, Elige Radon, MD  glucose blood (  ACCU-CHEK GUIDE) test strip Check BS in the morning and at bedtime Dx E11.8 07/01/21   Dettinger, Elige Radon, MD  metoprolol succinate (TOPROL XL) 25 MG 24 hr tablet Take 1 tablet (25 mg total) by mouth daily. Patient not taking: Reported on 05/05/2023 04/13/23   Reather Littler D, NP      Allergies    Patient has no known allergies.    Review of Systems   Review of Systems  All other systems reviewed and are negative.   Physical Exam Updated Vital Signs BP 112/87   Pulse (!) 116   Temp 98 F (36.7 C) (Oral)   Resp 18   SpO2 95%  Physical Exam Vitals and nursing note reviewed.  Constitutional:       Appearance: He is well-developed.  HENT:     Head: Normocephalic and atraumatic.  Cardiovascular:     Rate and Rhythm: Regular rhythm. Tachycardia present.     Heart sounds: No murmur heard. Pulmonary:     Effort: Pulmonary effort is normal. No respiratory distress.     Breath sounds: Normal breath sounds.  Abdominal:     Palpations: Abdomen is soft.     Tenderness: There is no abdominal tenderness. There is no guarding or rebound.  Musculoskeletal:     Comments: Multiple ulcerated eschars to ble with pitting edema and erythema to BLE.  Unable to palpate pedal pulses bilaterally.  Doppler pulses present bilaterally. Feet are warm with brisk cap refill  Skin:    General: Skin is warm and dry.  Neurological:     Mental Status: He is alert and oriented to person, place, and time.  Psychiatric:        Behavior: Behavior normal.     ED Results / Procedures / Treatments   Labs (all labs ordered are listed, but only abnormal results are displayed) Labs Reviewed  COMPREHENSIVE METABOLIC PANEL - Abnormal; Notable for the following components:      Result Value   Sodium 132 (*)    CO2 20 (*)    Glucose, Bld 182 (*)    Calcium 8.5 (*)    Albumin 3.4 (*)    All other components within normal limits  BRAIN NATRIURETIC PEPTIDE - Abnormal; Notable for the following components:   B Natriuretic Peptide 1,045.5 (*)    All other components within normal limits  C-REACTIVE PROTEIN - Abnormal; Notable for the following components:   CRP 2.5 (*)    All other components within normal limits  CBG MONITORING, ED - Abnormal; Notable for the following components:   Glucose-Capillary 200 (*)    All other components within normal limits  CULTURE, BLOOD (ROUTINE X 2)  CULTURE, BLOOD (ROUTINE X 2)  CBC WITH DIFFERENTIAL/PLATELET  SEDIMENTATION RATE  PREALBUMIN  D-DIMER, QUANTITATIVE  I-STAT CG4 LACTIC ACID, ED    EKG None  Radiology DG Tibia/Fibula Right Result Date: 05/04/2023 CLINICAL  DATA:  Diabetic wounds.  Pain and redness. EXAM: RIGHT TIBIA AND FIBULA - 2 VIEW COMPARISON:  None Available. FINDINGS: There is no evidence of fracture or other focal bone lesions. No erosive change or periostitis. Degenerative change of the knee and ankle. Chondrocalcinosis of the knee. Areas of subcutaneous edema, no soft tissue gas or radiopaque foreign body. IMPRESSION: 1. Subcutaneous edema. No soft tissue gas or radiopaque foreign body. 2. No radiographic evidence of osteomyelitis. 3. Degenerative change of the knee and ankle. Electronically Signed   By: Narda Rutherford M.D.   On: 05/04/2023 21:03  DG Tibia/Fibula Left Result Date: 05/04/2023 CLINICAL DATA:  Bilateral lower extremity diabetic wounds EXAM: LEFT TIBIA AND FIBULA - 2 VIEW COMPARISON:  None Available. FINDINGS: Normal alignment. No acute fracture or dislocation. Degenerative changes are noted within the left knee and left ankle. No osseous erosions or abnormal periosteal reaction. Mild vascular calcification noted. IMPRESSION: 1. Degenerative changes. No radiographic evidence of osteomyelitis. Electronically Signed   By: Helyn Numbers M.D.   On: 05/04/2023 21:02    Procedures Procedures    Medications Ordered in ED Medications  metroNIDAZOLE (FLAGYL) tablet 500 mg (500 mg Oral Given 05/05/23 0123)  aspirin EC tablet 81 mg (has no administration in time range)  atorvastatin (LIPITOR) tablet 80 mg (has no administration in time range)  fenofibrate tablet 54 mg (has no administration in time range)  losartan (COZAAR) tablet 25 mg (has no administration in time range)  spironolactone (ALDACTONE) tablet 12.5 mg (has no administration in time range)  empagliflozin (JARDIANCE) tablet 25 mg (has no administration in time range)  famotidine (PEPCID) tablet 20 mg (20 mg Oral Given 05/05/23 0215)  acetaminophen (TYLENOL) tablet 650 mg (has no administration in time range)    Or  acetaminophen (TYLENOL) suppository 650 mg (has no  administration in time range)  ondansetron (ZOFRAN) tablet 4 mg (has no administration in time range)    Or  ondansetron (ZOFRAN) injection 4 mg (has no administration in time range)  insulin aspart (novoLOG) injection 0-15 Units (3 Units Subcutaneous Given 05/05/23 0532)  HYDROcodone-acetaminophen (NORCO/VICODIN) 5-325 MG per tablet 1-2 tablet (has no administration in time range)  morphine (PF) 2 MG/ML injection 2 mg (has no administration in time range)  furosemide (LASIX) injection 40 mg (40 mg Intravenous Given 05/05/23 0532)  ceFEPIme (MAXIPIME) 2 g in sodium chloride 0.9 % 100 mL IVPB (0 g Intravenous Stopped 05/05/23 0203)  HYDROcodone-acetaminophen (NORCO/VICODIN) 5-325 MG per tablet 1 tablet (1 tablet Oral Given 05/05/23 0214)    ED Course/ Medical Decision Making/ A&P                                 Medical Decision Making Amount and/or Complexity of Data Reviewed Labs: ordered.  Risk Prescription drug management. Decision regarding hospitalization.   Patient with history of diabetes here for evaluation of progressive wounds to bilateral lower extremities.  He has multiple wounds and eschars to bilateral legs.  He is mildly tachycardic.  He also has lower extremity edema.  Concern for cellulitis due to diabetic wound.  He has warm feet but unable to palpate pedal pulses.  These can be dopplered.  Will start on antibiotics.  Plan to admit for ongoing care.  Patient and family at bedside updated findings of studies and recommendation for admission and they are in agreement with treatment plan.        Final Clinical Impression(s) / ED Diagnoses Final diagnoses:  Wound infection  Cellulitis of lower extremity, unspecified laterality    Rx / DC Orders ED Discharge Orders     None         Tilden Fossa, MD 05/05/23 [phone]

## 2023-05-05 NOTE — Assessment & Plan Note (Signed)
No complaints of chest pain Continue aspirin and atorvastatin and metoprolol

## 2023-05-05 NOTE — Assessment & Plan Note (Addendum)
Clinically fluid overloaded with lower extremity edema, dyspnea on exertion BNP resulting over thousand Possibly triggered by fluid administration during recent past hospitalization We will get chest x-ray IV Lasix Continue GDMT with spironolactone, metoprolol, losartan, Jardiance  Daily weights with intake and output monitoring Will get echocardiogram

## 2023-05-05 NOTE — Progress Notes (Signed)
PHARMACY - ANTICOAGULATION CONSULT NOTE  Pharmacy Consult for IV heparin Indication: LV thrombus  No Known Allergies  Patient Measurements:   Heparin Dosing Weight: 113 kg  Vital Signs: Temp: 98.2 F (36.8 C) (12/17 1105) Temp Source: Oral (12/17 1105) BP: 102/85 (12/17 1111) Pulse Rate: 107 (12/17 1111)  Labs: Recent Labs    05/04/23 1927  HGB 15.2  HCT 46.3  PLT 312  CREATININE 1.22    Estimated Creatinine Clearance: 95.1 mL/min (by C-G formula based on SCr of 1.22 mg/dL).   Medical History: Past Medical History:  Diagnosis Date   Arthritis    Atrial fibrillation (HCC)    CHF (congestive heart failure) (HCC)    Diabetes mellitus without complication (HCC)    Gout    Heart attack (HCC) 10/2018   Hyperlipidemia    Hypertension    Morbid obesity (HCC) 12/22/2016   Sleep apnea    had test twice unable to complete    Medications:  (Not in a hospital admission)  Scheduled:   aspirin EC  81 mg Oral Q breakfast   atorvastatin  80 mg Oral Daily   empagliflozin  25 mg Oral Daily   famotidine  20 mg Oral BID   fenofibrate  54 mg Oral Daily   furosemide  40 mg Intravenous Q12H   heparin  5,000 Units Intravenous Once   insulin aspart  0-15 Units Subcutaneous Q4H   metroNIDAZOLE  500 mg Oral Q12H   spironolactone  12.5 mg Oral Daily   PRN: acetaminophen **OR** acetaminophen, HYDROcodone-acetaminophen, morphine injection, ondansetron **OR** ondansetron (ZOFRAN) IV, perflutren lipid microspheres (DEFINITY) IV suspension  Assessment: 49 yoM with PMH CAD, CHF known to pharmacy from antibiotic dosing. Briefly; admitted with concern for LE edema and possible DM wounds. TTE done for CHF workup shows new LV thrombus; Cardiology consulted and Pharmacy to dose IV heparin .  Baseline INR, aPTT: not done Prior anticoagulation: none (only on ASA 81 mg PTA for CAD)  Significant events:  Today, 05/05/2023: CBC: WNL SCr slightly elevated (baseline ~0.9) No bleeding or  infusion issues per nursing  Goal of Therapy: Heparin level 0.3-0.7 units/ml Monitor platelets by anticoagulation protocol: Yes  Plan: Heparin 5000 units IV bolus x 1 Heparin 2000 units/hr IV infusion Check heparin level 8 hrs after start Daily CBC, daily heparin level once stable Monitor for signs of bleeding or thrombosis  Bernadene Person, PharmD, BCPS [phone] 05/05/2023, 12:43 PM

## 2023-05-05 NOTE — H&P (Addendum)
History and Physical    Patient: Evan [name] NFA:[id number] DOB: 07-23-1965 DOA: 05/04/2023 DOS: the patient was seen and examined on 05/05/2023 PCP: Dettinger, Elige Radon, MD  Patient coming from: Home  Chief Complaint:  Chief Complaint  Patient presents with   Wound Infection    HPI: Evan [name] is a 57 y.o. male with medical history significant for Hypertension, T2DM, CAD status post PCI, ischemic cardiomyopathy with systolic CHF (EF 46% 09/2022), recently hospitalized from 11/30 to 04/21/2023 with acute cholecystitis presenting with a near syncopal episode, treated medically with antibiotics(patient refused HIDA scan to enable IR to perform cholecystostomy) who was sent in by his PCP with concerns for infections of lower extremity wounds.  Patient reports a 2-week history of lower extremity edema, dyspnea on exertion.  He has scattered wounds throughout bilateral lower extremities for the past 2 months which he attributes to chigger bites and states that they have worsened along with his increased lower extremity edema.  He reports increased redness, pain and weeping from the wounds.  He has associated generalized malaise and feels like his heart is racing.  Denies chest pain, fevers, abdominal pain.   ED course and data review: On arrival tachycardic to 116 and tachypneic to 23 with otherwise normal vitals. Pulses dopplerable in the ED Labs notable for  normal CBC with differential and lactic acid 1.7. CMP mostly unremarkable, with very minor electrolyte abnormalities. BNP resulted after admission >1000 Right Tib-fib x-ray showing subcutaneous edema with no soft tissue gas or radiopaque foreign body. Left tib-fib with no abnormalities Patient started on cefepime and Flagyl   Hospitalist consulted for admission.   Review of Systems: As mentioned in the history of present illness. All other systems reviewed and are negative.  Past Medical History:  Diagnosis Date   Arthritis     Atrial fibrillation (HCC)    CHF (congestive heart failure) (HCC)    Diabetes mellitus without complication (HCC)    Gout    Heart attack (HCC) 10/2018   Hyperlipidemia    Hypertension    Morbid obesity (HCC) 12/22/2016   Sleep apnea    had test twice unable to complete   Past Surgical History:  Procedure Laterality Date   ANKLE ARTHROSCOPY Right    COLONOSCOPY WITH PROPOFOL N/A 09/18/2020   Procedure: COLONOSCOPY WITH PROPOFOL;  Surgeon: Evan Rist, MD;  Location: [facility];  Service: Gastroenterology;  Laterality: N/A;  09-18-2020   CORONARY STENT INTERVENTION N/A 11/24/2018   Procedure: CORONARY STENT INTERVENTION;  Surgeon: Evan Ouch, MD;  Location: [facility];  Service: Cardiovascular;  Laterality: N/A;  prox and mid LAD   CORONARY STENT INTERVENTION N/A 11/26/2018   Procedure: CORONARY STENT INTERVENTION;  Surgeon: Evan Ouch, MD;  Location: [facility];  Service: Cardiovascular;  Laterality: N/A;   CYST REMOVAL HAND     KNEE ARTHROSCOPY Left    LEFT HEART CATH AND CORONARY ANGIOGRAPHY N/A 11/24/2018   Procedure: LEFT HEART CATH AND CORONARY ANGIOGRAPHY;  Surgeon: Evan Ouch, MD;  Location: [facility];  Service: Cardiovascular;  Laterality: N/A;   POLYPECTOMY  09/18/2020   Procedure: POLYPECTOMY;  Surgeon: Evan Rist, MD;  Location: [facility];  Service: Gastroenterology;;   Social History:  reports that he has never smoked. He has never used smokeless tobacco. He reports current alcohol use of about 12.0 standard drinks of alcohol per week. He reports that he does not use drugs.  No  Known Allergies  Family History  Problem Relation Age of Onset   Diabetes Mother    Stroke Mother    Early death Father    Heart attack Father        died from heart attack at the age of 104   Alcohol abuse Father    Heart disease Father    Breast cancer Sister    Hypertension Brother    Heart attack Maternal Grandfather        died from  heart attack at the age of 48   Early death Paternal Grandfather    Heart attack Paternal Grandfather    Colon cancer Neg Hx    Esophageal cancer Neg Hx    Rectal cancer Neg Hx    Stomach cancer Neg Hx     Prior to Admission medications   Medication Sig Start Date End Date Taking? Authorizing Provider  acetaminophen (TYLENOL) 325 MG tablet Take 2 tablets (650 mg total) by mouth every 6 (six) hours as needed for mild pain (pain score 1-3) or fever (or Fever >/= 101). 04/21/23   Evan Hale, MD  aspirin EC 81 MG tablet Take 1 tablet (81 mg total) by mouth daily with breakfast. Swallow whole. 04/22/23   Evan Hale, MD  atorvastatin (LIPITOR) 80 MG tablet Take 1 tablet (80 mg total) by mouth daily. 04/21/23   Evan Hale, MD  BD PEN NEEDLE NANO 2ND GEN 32G X 4 MM MISC Use daily with insulin Dx E11.9, Z79.4 06/22/20   Dettinger, Elige Radon, MD  Blood Glucose Monitoring Suppl (ACCU-CHEK AVIVA PLUS) w/Device KIT 1 each by Does not apply route 4 (four) times daily. 06/17/21   Dettinger, Elige Radon, MD  Blood Glucose Monitoring Suppl (ACCU-CHEK AVIVA) device Test BS BID Dx E11.69 05/24/20   Dettinger, Elige Radon, MD  empagliflozin (JARDIANCE) 25 MG TABS tablet Take 1 tablet (25 mg total) by mouth daily. 08/07/22   Dettinger, Elige Radon, MD  famotidine (PEPCID) 20 MG tablet Take 1 tablet (20 mg total) by mouth 2 (two) times daily. 04/21/23   Evan Hale, MD  fenofibrate (TRICOR) 145 MG tablet Take 1 tablet (145 mg total) by mouth daily. 08/07/22   Dettinger, Elige Radon, MD  furosemide (LASIX) 20 MG tablet Take 1 tablet (20 mg total) by mouth 2 (two) times daily. 04/21/23   Evan Hale, MD  glucose blood (ACCU-CHEK GUIDE) test strip Check BS in the morning and at bedtime Dx E11.8 07/01/21   Dettinger, Elige Radon, MD  losartan (COZAAR) 25 MG tablet Take 1 tablet (25 mg total) by mouth daily. Patient taking differently: Take 25 mg by mouth at bedtime. 08/07/22   Dettinger, Elige Radon, MD  metoprolol  succinate (TOPROL XL) 25 MG 24 hr tablet Take 1 tablet (25 mg total) by mouth daily. 04/13/23   Evan Littler D, NP  Omega-3 Fatty Acids (FISH OIL) 1000 MG CAPS Take 2,000 mg by mouth every other day.    [provider]  spironolactone (ALDACTONE) 25 MG tablet Take 0.5 tablets (12.5 mg total) by mouth daily. 04/21/23   Evan Hale, MD    Physical Exam: Vitals:   05/04/23 1835 05/04/23 2237  BP: 110/82 (!) 130/96  Pulse: (!) 108 (!) 116  Resp: (!) 23 18  Temp: 97.7 F (36.5 C) 97.8 F (36.6 C)  TempSrc: Oral Oral  SpO2: 98% 95%   Physical Exam Vitals and nursing note reviewed.  Constitutional:      General: He is not in acute  distress. HENT:     Head: Normocephalic and atraumatic.  Cardiovascular:     Rate and Rhythm: Normal rate and regular rhythm.     Heart sounds: Normal heart sounds.  Pulmonary:     Effort: Pulmonary effort is normal.     Breath sounds: Normal breath sounds.  Abdominal:     Palpations: Abdomen is soft.     Tenderness: There is no abdominal tenderness.  Musculoskeletal:     Right lower leg: Edema present.     Left lower leg: Edema present.     Comments: Multiple scattered wounds, some with necrotic bases on lower extremities. See pic  Neurological:     Mental Status: Mental status is at baseline.          Labs on Admission: I have personally reviewed following labs and imaging studies  CBC: Recent Labs  Lab 05/04/23 1927  WBC 10.3  NEUTROABS 7.1  HGB 15.2  HCT 46.3  MCV 86.9  PLT 312   Basic Metabolic Panel: Recent Labs  Lab 05/04/23 1927  NA 132*  K 3.7  CL 101  CO2 20*  GLUCOSE 182*  BUN 20  CREATININE 1.22  CALCIUM 8.5*   GFR: Estimated Creatinine Clearance: 95.1 mL/min (by C-G formula based on SCr of 1.22 mg/dL). Liver Function Tests: Recent Labs  Lab 05/04/23 1927  AST 33  ALT 28  ALKPHOS 79  BILITOT 1.0  PROT 7.1  ALBUMIN 3.4*   No results for input(s): "LIPASE", "AMYLASE" in the last 168  hours. No results for input(s): "AMMONIA" in the last 168 hours. Coagulation Profile: No results for input(s): "INR", "PROTIME" in the last 168 hours. Cardiac Enzymes: No results for input(s): "CKTOTAL", "CKMB", "CKMBINDEX", "TROPONINI" in the last 168 hours. BNP (last 3 results) No results for input(s): "PROBNP" in the last 8760 hours. HbA1C: No results for input(s): "HGBA1C" in the last 72 hours. CBG: No results for input(s): "GLUCAP" in the last 168 hours. Lipid Profile: No results for input(s): "CHOL", "HDL", "LDLCALC", "TRIG", "CHOLHDL", "LDLDIRECT" in the last 72 hours. Thyroid Function Tests: No results for input(s): "TSH", "T4TOTAL", "FREET4", "T3FREE", "THYROIDAB" in the last 72 hours. Anemia Panel: No results for input(s): "VITAMINB12", "FOLATE", "FERRITIN", "TIBC", "IRON", "RETICCTPCT" in the last 72 hours. Urine analysis:    Component Value Date/Time   COLORURINE YELLOW 04/19/2023 0840   APPEARANCEUR CLEAR 04/19/2023 0840   APPEARANCEUR Clear 10/20/2022 1623   LABSPEC >1.046 (H) 04/19/2023 0840   PHURINE 5.0 04/19/2023 0840   GLUCOSEU >=500 (A) 04/19/2023 0840   HGBUR SMALL (A) 04/19/2023 0840   BILIRUBINUR NEGATIVE 04/19/2023 0840   BILIRUBINUR Negative 10/20/2022 1623   KETONESUR 5 (A) 04/19/2023 0840   PROTEINUR 30 (A) 04/19/2023 0840   NITRITE NEGATIVE 04/19/2023 0840   LEUKOCYTESUR NEGATIVE 04/19/2023 0840    Radiological Exams on Admission: DG Tibia/Fibula Right Result Date: 05/04/2023 CLINICAL DATA:  Diabetic wounds.  Pain and redness. EXAM: RIGHT TIBIA AND FIBULA - 2 VIEW COMPARISON:  None Available. FINDINGS: There is no evidence of fracture or other focal bone lesions. No erosive change or periostitis. Degenerative change of the knee and ankle. Chondrocalcinosis of the knee. Areas of subcutaneous edema, no soft tissue gas or radiopaque foreign body. IMPRESSION: 1. Subcutaneous edema. No soft tissue gas or radiopaque foreign body. 2. No radiographic  evidence of osteomyelitis. 3. Degenerative change of the knee and ankle. Electronically Signed   By: Narda Rutherford M.[name].   On: 05/04/2023 21:03   DG Tibia/Fibula Left Result  Date: 05/04/2023 CLINICAL DATA:  Bilateral lower extremity diabetic wounds EXAM: LEFT TIBIA AND FIBULA - 2 VIEW COMPARISON:  None Available. FINDINGS: Normal alignment. No acute fracture or dislocation. Degenerative changes are noted within the left knee and left ankle. No osseous erosions or abnormal periosteal reaction. Mild vascular calcification noted. IMPRESSION: 1. Degenerative changes. No radiographic evidence of osteomyelitis. Electronically Signed   By: Helyn Numbers M.[name].   On: 05/04/2023 21:02     Data Reviewed: Relevant notes from primary care and specialist visits, past discharge summaries as available in EHR, including Care Everywhere. Prior diagnostic testing as pertinent to current admission diagnoses Updated medications and problem lists for reconciliation ED course, including vitals, labs, imaging, treatment and response to treatment Triage notes, nursing and pharmacy notes and ED provider's notes Notable results as noted in HPI   Assessment and Plan: Cellulitis in diabetic foot (HCC) Diabetic foot ulcers with necrosis to the fat and suspected infection Rocephin and vancomycin Vascular consult -will keep n.p.o. tonight Wound care Keep legs elevated Multimodal pain control Follow up ABIs ordered from the ED Given lower extremity edema and recent hospitalization, will get [name]-dimer, if elevated will get lower extremity ultrasound  Acute on chronic HFrEF (heart failure with reduced ejection fraction) (HCC) Clinically fluid overloaded with lower extremity edema, dyspnea on exertion BNP resulting over thousand Possibly triggered by fluid administration during recent past hospitalization We will get chest x-ray IV Lasix Continue GDMT with spironolactone, metoprolol, losartan, Jardiance  Daily weights  with intake and output monitoring Will get echocardiogram  CAD S/P PCI No complaints of chest pain Continue aspirin and atorvastatin and metoprolol  History of acute cholecystitis 04/18/23 treated conservatively No acute complaints  Patient had refused HIDA scan to enable IR to perform cholecystostomy(per DC summary) Completed antibiotics a few days prior  Essential hypertension Continue losartan and metoprolol    DVT prophylaxis: Lovenox  Consults: vascular, Dr Karin Lieu  Advance Care Planning:   Code Status: Prior   Family Communication: none  Disposition Plan: Back to previous home environment  Severity of Illness: The appropriate patient status for this patient is INPATIENT. Inpatient status is judged to be reasonable and necessary in order to provide the required intensity of service to ensure the patient's safety. The patient's presenting symptoms, physical exam findings, and initial radiographic and laboratory data in the context of their chronic comorbidities is felt to place them at high risk for further clinical deterioration. Furthermore, it is not anticipated that the patient will be medically stable for discharge from the hospital within 2 midnights of admission.   * I certify that at the point of admission it is my clinical judgment that the patient will require inpatient hospital care spanning beyond 2 midnights from the point of admission due to high intensity of service, high risk for further deterioration and high frequency of surveillance required.*  Author: Andris Baumann, MD 05/05/2023 1:56 AM  For on call review [email or web address].

## 2023-05-05 NOTE — Progress Notes (Signed)
ABI's and bilateral lower extremity venous duplex has been completed. Preliminary results can be found in CV Proc through chart review.   05/05/23 8:48 AM Olen Cordial RVT

## 2023-05-05 NOTE — Consult Note (Signed)
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  Hx of polysubstance abuse including cocaine Hx of picking at lesions due to concerns for parasites, mites LV thrombus CAD CHF Cholecystitis this November  Plan:  DC all antibiotics Follow-up urine drug screen I have ordered an ANCA CRP sed rate ANA anticardiolipin antibody antibeta-2 microglobulin rheumatoid factor, hepatitis panel, HIV cryoglobulins He should be transferred to a tertiary care center such as [facility], [place] or [facility] where he can have a proper skin biopsy, be seen by Dermatology and Rheumatology In the interim would ask Hematology to see tomorrow to assist in workup for his LV thrombus  Principal Problem:   Diabetic foot ulcer (HCC) Active Problems:   Essential hypertension   CAD S/P PCI   History of acute cholecystitis 04/18/23 treated conservatively   Acute on chronic HFrEF (heart failure with reduced ejection fraction) (HCC)   Cellulitis in diabetic foot (HCC)   CHF (congestive heart failure) (HCC)   Bilateral leg ulcer (HCC)   Acute on chronic combined systolic and diastolic CHF (congestive heart failure) (HCC)   Scheduled Meds:  aspirin EC  81 mg Oral Q breakfast   atorvastatin  80 mg Oral Daily   empagliflozin  25 mg Oral Daily   famotidine  20 mg Oral BID   fenofibrate  54 mg Oral Daily   furosemide  40 mg Intravenous Q12H   insulin aspart  0-15 Units Subcutaneous Q4H   spironolactone  12.5 mg Oral Daily   Continuous Infusions:  heparin 2,000 Units/hr (05/05/23 1302)   PRN Meds:.acetaminophen **OR** acetaminophen, HYDROcodone-acetaminophen, morphine injection, ondansetron **OR** ondansetron (ZOFRAN) IV  HPI: Evan [name] is a 57 y.o. male with past medical his significant for diabetes  mellitus coronary artery disease ischemic cardiomyopathy CHF who also has had cholecystitis but was not deemed a appropriate surgical candidate for cholecystectomy who has had a 74-month history of skin lesions on his lower extremities first evaluated by his primary care physician at [facility].  The patient initially thought he had been bitten by multiple chigger bites as he had diffuse erythematous lesions on both legs.  He was seen by his primary care physician who noticed some purulence and gave him a dose of ceftriaxone and sent him out with Bactrim.  Since then however these wounds have continued to persist and now have necrotic vasculitic type of appearance.  In questioning him about drug use he did admit that "I have done at including admitting to using cocaine--though not in past few weeks he claimed. I did not ask him about methamphatamine. He DID DENY ANY history of IVDU.  He did perseverate about the idea of parasites being in the skin and mentioning that he had dugout some things that look like mites that then he had burst.  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    He is now been sent to the hospital due to persistence and worsening of his lower extremity pathology.  Apparently PCP and others of thought that these were diabetic ulcers but this is NOT AT ALL the way diabetic ulcers appear. They nearly invariably occur on the feet not  on the legs.  Feet I examined and have no ulcers whatsoever.  During his workup in the ER he had an echocardiogram that shows a left ventricular thrombus.  He does have a history of blood clots in the past which he says was blamed on smoking.  His daughter he was on the phone with him when I visited him stated that she had multiple autoimmune conditions herself.   These ulcers are NOT infections from pyogenic bacteria  --though I dont doubt that they may become or have been superinfected esp sine he picks at the wounds himself.  Urine drug screen is already been ordered and hopefully this might be informative.  I have also ordered an ANCA as well as a CRP sed rate ANA antiphospholipid antibodies and anticardiolipin antibodies hepatitis panel and HIV test as well as cryoglobulins  To his LV thrombus workup I think it be prudent to have hematology see him while he is here.  With regards to his apparent vasculitis I think the best thing for him would be to be transferred to a tertiary care center with dermatology and rheumatology expertise.  Certainly a biopsy could be performed here though general surgery no longer do skin biopsies. I could attempt a punch biopsy tomorrow but I would need to find the equipment. He may also bleed quite a bit on heparin.  I really think though that a biopsy done by Dermatology with a  wider specimen than a punch would be more helpful.  I would not give him any more antibiotics.  I have personally spent 110 minutes involved in face-to-face and non-face-to-face activities for this patient on the day of the visit. Professional time spent includes the following activities: Preparing to see the patient (review of tests), Obtaining and/or reviewing separately obtained history (admission/discharge record), Performing a medically appropriate examination and/or evaluation , Ordering medications/tests/procedures, referring and communicating with other health care professionals, Documenting clinical information in the EMR, Independently interpreting results (not separately reported), Communicating results to the patient/family/caregiver, Counseling and educating the patient/family/caregiver and Care coordination (not separately reported).     Review of Systems: Review of Systems  Constitutional:  Negative for chills, fever, malaise/fatigue and weight loss.  HENT:  Negative for congestion and  sore throat.   Eyes:  Negative for blurred vision and photophobia.  Respiratory:  Positive for shortness of breath. Negative for cough and wheezing.   Cardiovascular:  Positive for leg swelling. Negative for chest pain and palpitations.  Gastrointestinal:  Negative for abdominal pain, blood in stool, constipation, diarrhea, heartburn, melena, nausea and vomiting.  Genitourinary:  Negative for dysuria, flank pain and hematuria.  Musculoskeletal:  Negative for back pain, falls, joint pain and myalgias.  Skin:  Negative for itching and rash.  Neurological:  Negative for dizziness, focal weakness, loss of consciousness, weakness and headaches.  Endo/Heme/Allergies:  Does not bruise/bleed easily.  Psychiatric/Behavioral:  Negative for depression and suicidal ideas. The patient does not have insomnia.     Past Medical History:  Diagnosis Date   Arthritis    Atrial fibrillation (HCC)    CHF (congestive heart failure) (HCC)    Diabetes mellitus without complication (HCC)    Gout    Heart attack (HCC) 10/2018   Hyperlipidemia    Hypertension    Morbid obesity (HCC) 12/22/2016   Sleep apnea    had test twice unable to complete    Social History   Tobacco Use   Smoking status: Never   Smokeless tobacco: Never  Vaping Use   Vaping status: Never Used  Substance Use Topics   Alcohol use: Yes    Alcohol/week: 12.0 standard drinks of alcohol    Types: 12 Cans of beer per week    Comment: 5 beers a week   Drug use: No    Family History  Problem Relation Age of Onset   Diabetes Mother    Stroke Mother    Early death Father    Heart attack Father        died from heart attack at the age of 74   Alcohol abuse Father    Heart disease Father    Breast cancer Sister    Hypertension Brother    Heart attack Maternal Grandfather        died from heart attack at the age of 38   Early death Paternal Grandfather    Heart attack Paternal Grandfather    Colon cancer Neg Hx    Esophageal  cancer Neg Hx    Rectal cancer Neg Hx    Stomach cancer Neg Hx    No Known Allergies  OBJECTIVE: Blood pressure (!) 132/117, pulse 72, temperature 98 F (36.7 C), temperature source Oral, resp. rate 18, SpO2 95%.  Physical Exam Constitutional:      Appearance: He is well-developed.  HENT:     Head: Normocephalic and atraumatic.  Eyes:     Conjunctiva/sclera: Conjunctivae normal.  Cardiovascular:     Rate and Rhythm: Normal rate and regular rhythm.  Pulmonary:     Effort: Pulmonary effort is normal. No respiratory distress.     Breath sounds: No wheezing.  Abdominal:     General: There is no distension.     Palpations: Abdomen is soft.  Musculoskeletal:        General: No tenderness.     Cervical back: Normal range of motion and neck supple.  Skin:    General: Skin is warm and dry.     Coloration: Skin is not pale.     Findings: No erythema or rash.  Neurological:     General: No focal deficit present.     Mental Status: He is alert and oriented to person, place, and time.  Psychiatric:        Mood and Affect: Mood normal.        Behavior: Behavior normal.        Thought Content: Thought content normal.        Judgment: Judgment normal.    Legs 05/05/2023:         Lab Results Lab Results  Component Value Date   WBC 10.3 05/04/2023   HGB 15.2 05/04/2023   HCT 46.3 05/04/2023   MCV 86.9 05/04/2023   PLT 312 05/04/2023    Lab Results  Component Value Date   CREATININE 1.22 05/04/2023   BUN 20 05/04/2023   NA 132 (L) 05/04/2023   K 3.7 05/04/2023   CL 101 05/04/2023   CO2 20 (L) 05/04/2023    Lab Results  Component Value Date   ALT 28 05/04/2023   AST 33 05/04/2023   ALKPHOS 79 05/04/2023   BILITOT 1.0 05/04/2023     Microbiology: No results found for this or any previous visit (from the past 240 hours).  Acey Lav, MD [facility] for Infectious Disease [facility] [phone] pager  05/05/2023, 5:02 PM

## 2023-05-05 NOTE — Assessment & Plan Note (Addendum)
No acute complaints  Patient had refused HIDA scan to enable IR to perform cholecystostomy(per DC summary) Completed antibiotics a few days prior

## 2023-05-05 NOTE — Progress Notes (Addendum)
PROGRESS NOTE  Evan [name] VHQ:[id number] DOB: [birth year]   PCP: Dettinger, Elige Radon, MD  Patient is from: Home.  DOA: 05/04/2023 LOS: 0  Chief complaints Chief Complaint  Patient presents with   Wound Infection     Brief Narrative / Interim history: 57 year old M with PMH of sCHF/ICM, CAD/PCI, DM-2, polysubstance use, recent acute cholecystitis for which she was treated with antibiotics after he refused HIDA scan and biliary drain sent to ED by PCP due to bilateral lower extremity "wound infection".  He reports increased redness, pain and weeping from his legs.  He also had dyspnea on exertion and BLE edema for about 2 weeks.  He was admitted with working diagnosis of diabetic wound infection and acute on chronic CHF.  BNP about 1000.  No fever or leukocytosis.  Bilateral leg x-ray with subcutaneous edema.  Cultures obtained.  Started on antibiotics.  TTE and ABI ordered.  Vascular surgery consulted.  TTE with LVEF of 20%, GH, G3 DD, LV thrombus, moderate MR and RVSP of 59.6 mmHg.  Started on IV heparin.  Cardiology and ID consulted.  Subjective: Seen and examined earlier this morning.  No major events overnight of this morning.  He reports DOE and edema.  Reports having worsening ulcers in his legs and hands over the 3 to 4 weeks.  Initially started on his right knee about 5 to 6 weeks ago.  No pruritus.   Objective: Vitals:   05/05/23 0804 05/05/23 1015 05/05/23 1105 05/05/23 1111  BP: 106/79 102/85  102/85  Pulse: 64   (!) 107  Resp: 18 16  16   Temp: 98.2 F (36.8 C)  98.2 F (36.8 C)   TempSrc: Oral  Oral   SpO2: 98%   93%    Examination:  GENERAL: No apparent distress.  Nontoxic. HEENT: MMM.  Vision and hearing grossly intact.  NECK: Supple.  No apparent JVD.  RESP:  No IWOB.  Fair aeration bilaterally. CVS:  RRR. Heart sounds normal.  ABD/GI/GU: BS+. Abd soft, NTND.  MSK/EXT:  Moves extremities.  Scattered BLE and bilateral hand ulceration.  Some with purulent  drainage.  2+ BLE edema. SKIN: As above. NEURO: Awake, alert and oriented appropriately.  No apparent focal neuro deficit. PSYCH: Calm. Normal affect.   Procedures:  None  Microbiology summarized: Blood cultures pending  Assessment and plan: Bilateral lower extremity and hand ulceration with infection and diabetic patient: Unclear etiology of this.  No pruritus.  Progressive.  Some with purulent drainage.  He has underlying CHF with significant edema.  History of cocaine use but patient denies substance use or IVDU other than occasional marijuana.  CRP 2.5.  ESR 1.0. -Continue blood cultures -Continue Flagyl.  Add cefepime and vancomycin -Follow ABI and lower extremity venous Doppler -ID consulted -Manage CHF as below -Check UDS -Follow cultures -Encourage leg elevation -Wound care consult   Acute on chronic HFrEF/BiV HF/LV thrombus: TTE with LVEF of 20%, GH, G3 DD, LV thrombus, moderate MR and RVSP of 59.6 mmHg.  TTE finding worse compared to his TTE in 2022.  Had DOE, significant edema.  BNP elevated to thousands.  Started on IV Lasix. -Continue IV Lasix 40 mg twice daily -Resume low-dose metoprolol if UDS negative for cocaine -Continue home Aldactone and Jardiance -Hold losartan for now -Strict intake and output, daily weight, renal functions and electrolytes.  Morning labs pending. -Start IV heparin for LV thrombus -Cardiology consulted  LV thrombus -Start IV heparin -Cardiology consult as above  NIDDM-2 with  hyperglycemia: A1c 7.3% on 12/1. Recent Labs  Lab 05/05/23 0521 05/05/23 0754 05/05/23 1147  GLUCAP 200* 159* 114*  -Continue current insulin regimen  History of CAD/PCI: No anginal symptoms. -Continue aspirin and Lipitor   History of acute cholecystitis 04/18/23 treated conservatively with antibiotics.  No GI symptoms.   Essential hypertension: Normotensive but soft. -Hold losartan metoprolol  Elevated D-dimer: Mild.  Likely due to LV thrombus. -Check  lower extremity venous Doppler -IV heparin for LV thrombus    There is no height or weight on file to calculate BMI.           DVT prophylaxis:  SCDs Start: 05/05/23 0204 On full dose anticoagulation.  Code Status: Full code Family Communication: None at bedside Level of care: Telemetry Status is: Inpatient Remains inpatient appropriate because: Acute on chronic combined CHF, LV thrombus and bilateral lower extremity ulcer with infection   Final disposition: TBD Consultants:  Infectious disease Cardiology Vascular surgery?  55 minutes with more than 50% spent in reviewing records, counseling patient/family and coordinating care.   Sch Meds:  Scheduled Meds:  aspirin EC  81 mg Oral Q breakfast   atorvastatin  80 mg Oral Daily   empagliflozin  25 mg Oral Daily   famotidine  20 mg Oral BID   fenofibrate  54 mg Oral Daily   furosemide  40 mg Intravenous Q12H   insulin aspart  0-15 Units Subcutaneous Q4H   metroNIDAZOLE  500 mg Oral Q12H   spironolactone  12.5 mg Oral Daily   Continuous Infusions:  ceFEPime (MAXIPIME) IV Stopped (05/05/23 0958)   vancomycin     vancomycin     PRN Meds:.acetaminophen **OR** acetaminophen, HYDROcodone-acetaminophen, morphine injection, ondansetron **OR** ondansetron (ZOFRAN) IV, perflutren lipid microspheres (DEFINITY) IV suspension  Antimicrobials: Anti-infectives (From admission, onward)    Start     Dose/Rate Route Frequency Ordered Stop   05/05/23 2200  Vancomycin (VANCOCIN) 1,500 mg in sodium chloride 0.9 % 500 mL IVPB        1,500 mg 250 mL/hr over 120 Minutes Intravenous 2 times daily 05/05/23 1120     05/05/23 1130  vancomycin (VANCOREADY) IVPB 2000 mg/400 mL        2,000 mg 200 mL/hr over 120 Minutes Intravenous  Once 05/05/23 1120     05/05/23 0930  ceFEPIme (MAXIPIME) 2 g in sodium chloride 0.9 % 100 mL IVPB        2 g 200 mL/hr over 30 Minutes Intravenous Every 8 hours 05/05/23 0759     05/05/23 0115  ceFEPIme  (MAXIPIME) 2 g in sodium chloride 0.9 % 100 mL IVPB        2 g 200 mL/hr over 30 Minutes Intravenous  Once 05/05/23 0106 05/05/23 0203   05/05/23 0100  metroNIDAZOLE (FLAGYL) tablet 500 mg        500 mg Oral Every 12 hours 05/05/23 0057 05/11/23 2159        I have personally reviewed the following labs and images: CBC: Recent Labs  Lab 05/04/23 1927  WBC 10.3  NEUTROABS 7.1  HGB 15.2  HCT 46.3  MCV 86.9  PLT 312   BMP &GFR Recent Labs  Lab 05/04/23 1927  NA 132*  K 3.7  CL 101  CO2 20*  GLUCOSE 182*  BUN 20  CREATININE 1.22  CALCIUM 8.5*   Estimated Creatinine Clearance: 95.1 mL/min (by C-G formula based on SCr of 1.22 mg/dL). Liver & Pancreas: Recent Labs  Lab 05/04/23 1927  AST 33  ALT 28  ALKPHOS 79  BILITOT 1.0  PROT 7.1  ALBUMIN 3.4*   No results for input(s): "LIPASE", "AMYLASE" in the last 168 hours. No results for input(s): "AMMONIA" in the last 168 hours. Diabetic: No results for input(s): "HGBA1C" in the last 72 hours. Recent Labs  Lab 05/05/23 0521 05/05/23 0754 05/05/23 1147  GLUCAP 200* 159* 114*   Cardiac Enzymes: No results for input(s): "CKTOTAL", "CKMB", "CKMBINDEX", "TROPONINI" in the last 168 hours. No results for input(s): "PROBNP" in the last 8760 hours. Coagulation Profile: No results for input(s): "INR", "PROTIME" in the last 168 hours. Thyroid Function Tests: No results for input(s): "TSH", "T4TOTAL", "FREET4", "T3FREE", "THYROIDAB" in the last 72 hours. Lipid Profile: No results for input(s): "CHOL", "HDL", "LDLCALC", "TRIG", "CHOLHDL", "LDLDIRECT" in the last 72 hours. Anemia Panel: No results for input(s): "VITAMINB12", "FOLATE", "FERRITIN", "TIBC", "IRON", "RETICCTPCT" in the last 72 hours. Urine analysis:    Component Value Date/Time   COLORURINE YELLOW 04/19/2023 0840   APPEARANCEUR CLEAR 04/19/2023 0840   APPEARANCEUR Clear 10/20/2022 1623   LABSPEC >1.046 (H) 04/19/2023 0840   PHURINE 5.0 04/19/2023 0840    GLUCOSEU >=500 (A) 04/19/2023 0840   HGBUR SMALL (A) 04/19/2023 0840   BILIRUBINUR NEGATIVE 04/19/2023 0840   BILIRUBINUR Negative 10/20/2022 1623   KETONESUR 5 (A) 04/19/2023 0840   PROTEINUR 30 (A) 04/19/2023 0840   NITRITE NEGATIVE 04/19/2023 0840   LEUKOCYTESUR NEGATIVE 04/19/2023 0840   Sepsis Labs: Invalid input(s): "PROCALCITONIN", "LACTICIDVEN"  Microbiology: No results found for this or any previous visit (from the past 240 hours).  Radiology Studies: ECHOCARDIOGRAM COMPLETE Result Date: 05/05/2023    ECHOCARDIOGRAM REPORT   Patient Name:   Evan [name] Date of Exam: 05/05/2023 Medical Rec #:  [id number]      Height:       77.0 in Accession #:    [id number]     Weight:       260.0 lb Date of Birth:  July 07, 1965      BSA:          2.501 m Patient Age:    57 years       BP:           106/79 mmHg Patient Gender: M              HR:           100 bpm. Exam Location:  Inpatient Procedure: 2D Echo, Cardiac Doppler, Color Doppler and Intracardiac            Opacification Agent Indications:    CHF  History:        Patient has prior history of Echocardiogram examinations, most                 recent 08/23/2020. Cardiomyopathy, Previous Myocardial Infarction,                 Arrythmias:Atrial Fibrillation; Risk Factors:Diabetes,                 Hypertension and Sleep Apnea.  Sonographer:    Darlys Gales Referring Phys: [id number] HAZEL V DUNCAN IMPRESSIONS  1. Left ventricular ejection fraction, by estimation, is 20%. The left ventricle has severely decreased function. The left ventricle demonstrates global hypokinesis. The left ventricular internal cavity size was mildly dilated. Left ventricular diastolic parameters are consistent with Grade III diastolic dysfunction (restrictive). There was a thrombus at the LV apex.  2. Right ventricular systolic function is moderately reduced. The right ventricular size  is mildly enlarged. There is moderately elevated pulmonary artery systolic pressure. The estimated  right ventricular systolic pressure is 59.6 mmHg.  3. Left atrial size was moderately dilated.  4. Right atrial size was mildly dilated.  5. The mitral valve is abnormal. Moderate mitral valve regurgitation. No evidence of mitral stenosis.  6. The aortic valve is tricuspid. There is mild calcification of the aortic valve. Aortic valve regurgitation is not visualized. No aortic stenosis is present.  7. The inferior vena cava is dilated in size with <50% respiratory variability, suggesting right atrial pressure of 15 mmHg. FINDINGS  Left Ventricle: Left ventricular ejection fraction, by estimation, is 20%. The left ventricle has severely decreased function. The left ventricle demonstrates global hypokinesis. The left ventricular internal cavity size was mildly dilated. There is no left ventricular hypertrophy. Left ventricular diastolic parameters are consistent with Grade III diastolic dysfunction (restrictive). Right Ventricle: The right ventricular size is mildly enlarged. No increase in right ventricular wall thickness. Right ventricular systolic function is moderately reduced. There is moderately elevated pulmonary artery systolic pressure. The tricuspid regurgitant velocity is 3.34 m/s, and with an assumed right atrial pressure of 15 mmHg, the estimated right ventricular systolic pressure is 59.6 mmHg. Left Atrium: Left atrial size was moderately dilated. Right Atrium: Right atrial size was mildly dilated. Pericardium: Trivial pericardial effusion is present. Mitral Valve: The mitral valve is abnormal. Mild mitral annular calcification. Moderate mitral valve regurgitation. No evidence of mitral valve stenosis. Tricuspid Valve: The tricuspid valve is normal in structure. Tricuspid valve regurgitation is mild. Aortic Valve: The aortic valve is tricuspid. There is mild calcification of the aortic valve. Aortic valve regurgitation is not visualized. No aortic stenosis is present. Aortic valve mean gradient measures  3.0 mmHg. Aortic valve peak gradient measures 4.5 mmHg. Aortic valve area, by VTI measures 2.32 cm. Pulmonic Valve: The pulmonic valve was normal in structure. Pulmonic valve regurgitation is not visualized. Aorta: The aortic root is normal in size and structure. Venous: The inferior vena cava is dilated in size with less than 50% respiratory variability, suggesting right atrial pressure of 15 mmHg. IAS/Shunts: No atrial level shunt detected by color flow Doppler.  LEFT VENTRICLE PLAX 2D LVIDd:         6.20 cm LVIDs:         5.80 cm LV PW:         0.90 cm LV IVS:        0.60 cm LVOT diam:     2.10 cm LV SV:         33 LV SV Index:   13 LVOT Area:     3.46 cm  RIGHT VENTRICLE RV S prime:     10.30 cm/s TAPSE (M-mode): 0.8 cm LEFT ATRIUM              Index        RIGHT ATRIUM           Index LA Vol (A2C):   155.0 ml 61.98 ml/m  RA Area:     22.10 cm LA Vol (A4C):   117.0 ml 46.79 ml/m  RA Volume:   71.30 ml  28.51 ml/m LA Biplane Vol: 135.0 ml 53.99 ml/m  AORTIC VALVE AV Area (Vmax):    2.05 cm AV Area (Vmean):   2.03 cm AV Area (VTI):     2.32 cm AV Vmax:           106.00 cm/s AV Vmean:  75.700 cm/s AV VTI:            0.143 m AV Peak Grad:      4.5 mmHg AV Mean Grad:      3.0 mmHg LVOT Vmax:         62.70 cm/s LVOT Vmean:        44.400 cm/s LVOT VTI:          0.096 m LVOT/AV VTI ratio: 0.67  AORTA Ao Root diam: 3.20 cm MITRAL VALVE                TRICUSPID VALVE MV Area (PHT): 6.17 cm     TR Peak grad:   44.6 mmHg MV Decel Time: 123 msec     TR Vmax:        334.00 cm/s MV E velocity: 110.00 cm/s                             SHUNTS                             Systemic VTI:  0.10 m                             Systemic Diam: 2.10 cm Dalton McleanMD Electronically signed by Wilfred Lacy Signature Date/Time: 05/05/2023/11:36:02 AM    Final    VAS [place] LOWER EXTREMITY VENOUS (DVT) Result Date: 05/05/2023  Lower Venous DVT Study Patient Name:  Evan [name]  Date of Exam:   05/05/2023 Medical Rec #:  [id number]       Accession #:    [id number] Date of Birth: March 24, 1966       Patient Gender: M Patient Age:   16 years Exam Location:  [facility] Procedure:      VAS [place] LOWER EXTREMITY VENOUS (DVT) Referring Phys: Alwyn Ren Kellianne Ek --------------------------------------------------------------------------------  Indications: Elevated Ddimer.  Risk Factors: None identified. Limitations: Open wound. Comparison Study: No prior studies. Performing Technologist: Chanda Busing RVT  Examination Guidelines: A complete evaluation includes B-mode imaging, spectral Doppler, color Doppler, and power Doppler as needed of all accessible portions of each vessel. Bilateral testing is considered an integral part of a complete examination. Limited examinations for reoccurring indications may be performed as noted. The reflux portion of the exam is performed with the patient in reverse Trendelenburg.  +---------+---------------+---------+-----------+----------+--------------+ RIGHT    CompressibilityPhasicitySpontaneityPropertiesThrombus Aging +---------+---------------+---------+-----------+----------+--------------+ CFV      Full           Yes      Yes                                 +---------+---------------+---------+-----------+----------+--------------+ SFJ      Full                                                        +---------+---------------+---------+-----------+----------+--------------+ FV Prox  Full                                                        +---------+---------------+---------+-----------+----------+--------------+  FV Mid   Full                                                        +---------+---------------+---------+-----------+----------+--------------+ FV DistalFull                                                        +---------+---------------+---------+-----------+----------+--------------+ PFV      Full                                                         +---------+---------------+---------+-----------+----------+--------------+ POP      Full           Yes      Yes                                 +---------+---------------+---------+-----------+----------+--------------+ PTV      Full                                                        +---------+---------------+---------+-----------+----------+--------------+ PERO     Full                                                        +---------+---------------+---------+-----------+----------+--------------+   +---------+---------------+---------+-----------+----------+--------------+ LEFT     CompressibilityPhasicitySpontaneityPropertiesThrombus Aging +---------+---------------+---------+-----------+----------+--------------+ CFV      Full           Yes      Yes                                 +---------+---------------+---------+-----------+----------+--------------+ SFJ      Full                                                        +---------+---------------+---------+-----------+----------+--------------+ FV Prox  Full                                                        +---------+---------------+---------+-----------+----------+--------------+ FV Mid   Full                                                        +---------+---------------+---------+-----------+----------+--------------+  FV DistalFull                                                        +---------+---------------+---------+-----------+----------+--------------+ PFV      Full                                                        +---------+---------------+---------+-----------+----------+--------------+ POP      Full           Yes      Yes                                 +---------+---------------+---------+-----------+----------+--------------+ PTV      Full                                                         +---------+---------------+---------+-----------+----------+--------------+ PERO     Full                                                        +---------+---------------+---------+-----------+----------+--------------+    Summary: RIGHT: - There is no evidence of deep vein thrombosis in the lower extremity. However, portions of this examination were limited- see technologist comments above.  - No cystic structure found in the popliteal fossa.  LEFT: - There is no evidence of deep vein thrombosis in the lower extremity. However, portions of this examination were limited- see technologist comments above.  - No cystic structure found in the popliteal fossa.  *See table(s) above for measurements and observations.    Preliminary    VAS [place] ABI WITH/WO TBI Result Date: 05/05/2023  LOWER EXTREMITY DOPPLER STUDY Patient Name:  Evan [name]  Date of Exam:   05/05/2023 Medical Rec #: [id number]       Accession #:    [id number] Date of Birth: [birth year]       Patient Gender: M Patient Age:   58 years Exam Location:  [facility] Procedure:      VAS [place] ABI WITH/WO TBI Referring Phys: ELIZABETH REES --------------------------------------------------------------------------------  Indications: Rest pain, and ulceration. High Risk Factors: Hypertension, hyperlipidemia, Diabetes.  Limitations: Today's exam was limited due to an open wound and involuntary              patient movement. Comparison Study: No prior studies. Performing Technologist: Olen Cordial RVT  Examination Guidelines: A complete evaluation includes at minimum, Doppler waveform signals and systolic blood pressure reading at the level of bilateral brachial, anterior tibial, and posterior tibial arteries, when vessel segments are accessible. Bilateral testing is considered an integral part of a complete examination. Photoelectric Plethysmograph (PPG) waveforms and toe systolic pressure readings are included as required and additional duplex  testing as needed. Limited examinations for reoccurring indications may be performed  as noted.  ABI Findings: +---------+------------------+-----+-----------+--------+ Right    Rt Pressure (mmHg)IndexWaveform   Comment  +---------+------------------+-----+-----------+--------+ Brachial 95                     triphasic           +---------+------------------+-----+-----------+--------+ PTA      119               1.10 multiphasic         +---------+------------------+-----+-----------+--------+ DP       109               1.01 multiphasic         +---------+------------------+-----+-----------+--------+ Great Toe70                0.65                     +---------+------------------+-----+-----------+--------+ +---------+------------------+-----+-----------+-------+ Left     Lt Pressure (mmHg)IndexWaveform   Comment +---------+------------------+-----+-----------+-------+ Brachial 108                    triphasic          +---------+------------------+-----+-----------+-------+ PTA      112               1.04 multiphasic        +---------+------------------+-----+-----------+-------+ DP       241               2.23 multiphasic        +---------+------------------+-----+-----------+-------+ Great Toe83                0.77                    +---------+------------------+-----+-----------+-------+ +-------+-----------+-----------+------------+------------+ ABI/TBIToday's ABIToday's TBIPrevious ABIPrevious TBI +-------+-----------+-----------+------------+------------+ Right  1.1        0.65                                +-------+-----------+-----------+------------+------------+ Left   Dwight         0.77                                +-------+-----------+-----------+------------+------------+  Summary: Right: Resting right ankle-brachial index is within normal range. The right toe-brachial index is abnormal. Left: Resting left ankle-brachial  index indicates noncompressible left lower extremity arteries. The left toe-brachial index is normal. *See table(s) above for measurements and observations.     Preliminary    DG Tibia/Fibula Right Result Date: 05/04/2023 CLINICAL DATA:  Diabetic wounds.  Pain and redness. EXAM: RIGHT TIBIA AND FIBULA - 2 VIEW COMPARISON:  None Available. FINDINGS: There is no evidence of fracture or other focal bone lesions. No erosive change or periostitis. Degenerative change of the knee and ankle. Chondrocalcinosis of the knee. Areas of subcutaneous edema, no soft tissue gas or radiopaque foreign body. IMPRESSION: 1. Subcutaneous edema. No soft tissue gas or radiopaque foreign body. 2. No radiographic evidence of osteomyelitis. 3. Degenerative change of the knee and ankle. Electronically Signed   By: Narda Rutherford M.D.   On: 05/04/2023 21:03   DG Tibia/Fibula Left Result Date: 05/04/2023 CLINICAL DATA:  Bilateral lower extremity diabetic wounds EXAM: LEFT TIBIA AND FIBULA - 2 VIEW COMPARISON:  None Available. FINDINGS: Normal alignment. No acute fracture or dislocation. Degenerative changes are noted within the left knee and left  ankle. No osseous erosions or abnormal periosteal reaction. Mild vascular calcification noted. IMPRESSION: 1. Degenerative changes. No radiographic evidence of osteomyelitis. Electronically Signed   By: Helyn Numbers M.D.   On: 05/04/2023 21:02      [facility]  If 7PM-7AM, please contact night-coverage [email or web address] 05/05/2023, 12:08 PM home.

## 2023-05-05 NOTE — Progress Notes (Signed)
ED Pharmacy Antibiotic Sign Off An antibiotic consult was received from an ED provider for Cefepime per pharmacy dosing for diabetic wound infection, r/o osteomyelitis. A chart review was completed to assess appropriateness.   The following one time order(s) were placed:  Cefepime 2gm IV x1  Further antibiotic and/or antibiotic pharmacy consults should be ordered by the admitting provider if indicated.   Thank you for allowing pharmacy to be a part of this patient's care.   Junita Push, [facility]  Clinical Pharmacist 05/05/23 1:07 AM

## 2023-05-05 NOTE — Telephone Encounter (Signed)
Patient Product/process development scientist completed.    The patient is insured through [facility].     Ran test claim for Eliquis 5 mg and the current 30 day co-pay is $4.00.  Ran test claim for Xarelto 20 mg and the current 30 day co-pay is $4.00.  This test claim was processed through [facility]- copay amounts may vary at other pharmacies due to pharmacy/plan contracts, or as the patient moves through the different stages of their insurance plan.     Evan [name], CPHT Pharmacy Technician III Certified Patient Advocate [facility] Patient Advocate Team Direct Number: [phone]  Fax: [phone]

## 2023-05-05 NOTE — Assessment & Plan Note (Addendum)
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 tonight Wound care Keep legs elevated Multimodal pain control Follow up ABIs ordered from the ED Given lower extremity edema and recent hospitalization, will get D-dimer, if elevated will get lower extremity ultrasound

## 2023-05-05 NOTE — Progress Notes (Signed)
Pharmacy Antibiotic Note  Evan [name] is a 57 y.o. male with PMH CAD, CHF, recent admission for cholecystitis (treated only with abx), admitted on 05/04/2023 with concern for DM2 foot infection in the setting of 2-wk history of worsening pedal edema Pharmacy has been consulted for vancomycin dosing; already on cefepime and Flagyl per MD.  Plan: Vancomycin 2000 mg IV now, then 1500 mg IV q12 hr (est AUC 474 based on SCr 1.22; Vd 0.72 - note borderline BMI) Measure vancomycin AUC at steady state as indicated SCr q48 while on vanc Continue Cefepime and Flagyl per MD; dosing appropriate    Temp (24hrs), Avg:98 F (36.7 C), Min:97.7 F (36.5 C), Max:98.3 F (36.8 C)  Recent Labs  Lab 05/04/23 1927 05/04/23 2324  WBC 10.3  --   CREATININE 1.22  --   LATICACIDVEN  --  1.7    Estimated Creatinine Clearance: 95.1 mL/min (by C-G formula based on SCr of 1.22 mg/dL).    No Known Allergies  Antimicrobials this admission: 12/17 cefepime >>  12/17 vancomycin >>  12/17 metronidazole (PO) >>   Dose adjustments this admission: N/a  Microbiology results: 12/17 BCx: sent   Thank you for allowing pharmacy to be a part of this patient's care.  Tysha Grismore A 05/05/2023 11:22 AM

## 2023-05-05 NOTE — ED Notes (Signed)
Called pharmacy. Pt's UFH currently therapeutic, no bleeding or complications to PIV site. No changes are needed at this time.

## 2023-05-05 NOTE — Progress Notes (Signed)
PHARMACY - ANTICOAGULATION CONSULT NOTE  Pharmacy Consult for IV heparin Indication: LV thrombus  No Known Allergies  Patient Measurements:   Heparin Dosing Weight: 113 kg  Vital Signs: Temp: 97.7 F (36.5 C) (12/17 1935) Temp Source: Oral (12/17 1935) BP: 108/87 (12/17 1935) Pulse Rate: 108 (12/17 1935)  Labs: Recent Labs    05/04/23 1927 05/05/23 2038  HGB 15.2  --   HCT 46.3  --   PLT 312  --   HEPARINUNFRC  --  0.41  CREATININE 1.22  --     Estimated Creatinine Clearance: 95.1 mL/min (by C-G formula based on SCr of 1.22 mg/dL).  Assessment: 14 yoM with PMH CAD, CHF known to pharmacy from antibiotic dosing. Briefly; admitted with concern for LE edema and possible DM wounds. TTE done for CHF workup shows new LV thrombus; Cardiology consulted and Pharmacy to dose IV heparin .  Baseline INR, aPTT: not done Prior anticoagulation: none (only on ASA 81 mg PTA for CAD)  Significant events:  Today, 05/05/2023: CBC: WNL SCr slightly elevated (baseline ~0.9) First heparin level 0.41 - therapeutic after 5000 unit IV heparin bolus and drip at 2000 units/hr Per RN no bleeding/bruising or problems with IV heparin infusion  Goal of Therapy: Heparin level 0.3-0.7 units/ml Monitor platelets by anticoagulation protocol: Yes  Plan: Continue Heparin 2000 units/hr IV infusion Daily CBC, daily heparin level  Monitor for signs of bleeding or thrombosis  Herby Abraham, Pharm.D Use secure chat for questions 05/05/2023 9:25 PM

## 2023-05-05 NOTE — Assessment & Plan Note (Signed)
-  Continue losartan and metoprolol 

## 2023-05-05 NOTE — Consult Note (Signed)
Cardiology Consultation   Patient ID: Evan [name] MRN: [id number]; DOB: 01-27-1966  Admit date: 05/04/2023 Date of Consult: 05/05/2023  PCP:  Dettinger, Elige Radon, MD   [facility] Providers Cardiologist:  Nanetta Batty, MD        Patient Profile:   Evan [name] is a 57 y.o. male with a hx of CAD status post PCI 11/2018 (DES to LAD and R PDA), hypertension, hyperlipidemia, ischemic cardiomyopathy (LVEF 35%), PAF, morbid obesity, OSA, and diabetes who is being seen 05/05/2023 for the evaluation of worsening systolic dysfunction at the request of Dr. Alanda Slim.  History of Present Illness:   Evan [name] presents with a couple of weeks of leg swelling and concurrent dyspnea. He denies any chest pain or pressure. He has not been monitoring his weight at home and has not noticed any changes in how his clothes fit. He has been coughing frequently but denies expectoration, fever, or chills.  Five years ago, he experienced a heart attack, which he describes as feeling like a kick in the chest after climbing a hill. He denies any recent similar symptoms. He reports adherence to his heart medications and his cholesterol levels are reportedly well-controlled.  Despite his symptoms, he has been able to maintain his usual exercise routine, which includes walking his dog three to four times a day. He denies any exercise intolerance.  He was recently hospitalized 11/30-12/3 with acute cholecystitis.  He initially presented with near syncope and was treated medically.  He refused HIDA scan and cholecystostomy.  He saw his PCP was concerned for lower extremity wounds.  He noted 2 weeks of lower extremity edema and exertional dyspnea.  He attributed his lower extremity wounds to chigger bites and noted increased redness, pain, and weeping.  He also reported heart racing.  In the ED he was tachycardic to 116 and tachypnea to 23.  Lower extremity ultrasound revealed subcutaneous edema but no  definite evidence of osteomyelitis.  He was started on broad-spectrum antibiotics.  Vascular and wound teams were consulted.   Venous Dopplers were negative for DVT.  ABI was normal on the right and noncompressible on the left.  Given the lower extremity edema an echo was ordered.  Echo revealed LVEF 20% with global hypokinesis and grade 3 diastolic dysfunction.  There is a thrombus in the left ventricular apex.  Right ventricular function was also moderately reduced.  PASP was 59.6 mmHg.  He was noted to have moderate mitral regurgitation and right atrial pressure was 15 mmHg.   Past Medical History:  Diagnosis Date   Arthritis    Atrial fibrillation (HCC)    CHF (congestive heart failure) (HCC)    Diabetes mellitus without complication (HCC)    Gout    Heart attack (HCC) 10/2018   Hyperlipidemia    Hypertension    Morbid obesity (HCC) 12/22/2016   Sleep apnea    had test twice unable to complete    Past Surgical History:  Procedure Laterality Date   ANKLE ARTHROSCOPY Right    COLONOSCOPY WITH PROPOFOL N/A 09/18/2020   Procedure: COLONOSCOPY WITH PROPOFOL;  Surgeon: Sherrilyn Rist, MD;  Location: [facility];  Service: Gastroenterology;  Laterality: N/A;  09-18-2020   CORONARY STENT INTERVENTION N/A 11/24/2018   Procedure: CORONARY STENT INTERVENTION;  Surgeon: Iran Ouch, MD;  Location: [facility];  Service: Cardiovascular;  Laterality: N/A;  prox and mid LAD   CORONARY STENT INTERVENTION N/A 11/26/2018   Procedure: CORONARY STENT  INTERVENTION;  Surgeon: Iran Ouch, MD;  Location: [facility];  Service: Cardiovascular;  Laterality: N/A;   CYST REMOVAL HAND     KNEE ARTHROSCOPY Left    LEFT HEART CATH AND CORONARY ANGIOGRAPHY N/A 11/24/2018   Procedure: LEFT HEART CATH AND CORONARY ANGIOGRAPHY;  Surgeon: Iran Ouch, MD;  Location: [facility];  Service: Cardiovascular;  Laterality: N/A;   POLYPECTOMY  09/18/2020   Procedure: POLYPECTOMY;  Surgeon:  Sherrilyn Rist, MD;  Location: [facility];  Service: Gastroenterology;;     Home Medications:  Prior to Admission medications   Medication Sig Start Date End Date Taking? Authorizing Provider  acetaminophen (TYLENOL) 325 MG tablet Take 2 tablets (650 mg total) by mouth every 6 (six) hours as needed for mild pain (pain score 1-3) or fever (or Fever >/= 101). 04/21/23  Yes Shon Hale, MD  aspirin EC 81 MG tablet Take 1 tablet (81 mg total) by mouth daily with breakfast. Swallow whole. 04/22/23  Yes Emokpae, Courage, MD  empagliflozin (JARDIANCE) 25 MG TABS tablet Take 1 tablet (25 mg total) by mouth daily. 08/07/22  Yes Dettinger, Elige Radon, MD  famotidine (PEPCID) 20 MG tablet Take 1 tablet (20 mg total) by mouth 2 (two) times daily. 04/21/23  Yes Emokpae, Courage, MD  fenofibrate (TRICOR) 145 MG tablet Take 1 tablet (145 mg total) by mouth daily. 08/07/22  Yes Dettinger, Elige Radon, MD  furosemide (LASIX) 20 MG tablet Take 1 tablet (20 mg total) by mouth 2 (two) times daily. 04/21/23  Yes Emokpae, Courage, MD  losartan (COZAAR) 25 MG tablet Take 1 tablet (25 mg total) by mouth daily. Patient taking differently: Take 25 mg by mouth at bedtime. 08/07/22  Yes Dettinger, Elige Radon, MD  Omega-3 Fatty Acids (FISH OIL) 1000 MG CAPS Take 2,000 mg by mouth every other day.   Yes [provider]  pantoprazole (PROTONIX) 40 MG tablet Take 40 mg by mouth daily. 04/21/23  Yes [provider]  spironolactone (ALDACTONE) 25 MG tablet Take 0.5 tablets (12.5 mg total) by mouth daily. 04/21/23  Yes Emokpae, Courage, MD  atorvastatin (LIPITOR) 80 MG tablet Take 1 tablet (80 mg total) by mouth daily. Patient not taking: Reported on 05/05/2023 04/21/23   Shon Hale, MD  BD PEN NEEDLE NANO 2ND GEN 32G X 4 MM MISC Use daily with insulin Dx E11.9, Z79.4 06/22/20   Dettinger, Elige Radon, MD  Blood Glucose Monitoring Suppl (ACCU-CHEK AVIVA PLUS) w/Device KIT 1 each by Does not apply route 4 (four) times  daily. 06/17/21   Dettinger, Elige Radon, MD  Blood Glucose Monitoring Suppl (ACCU-CHEK AVIVA) device Test BS BID Dx E11.69 05/24/20   Dettinger, Elige Radon, MD  glucose blood (ACCU-CHEK GUIDE) test strip Check BS in the morning and at bedtime Dx E11.8 07/01/21   Dettinger, Elige Radon, MD  metoprolol succinate (TOPROL XL) 25 MG 24 hr tablet Take 1 tablet (25 mg total) by mouth daily. Patient not taking: Reported on 05/05/2023 04/13/23   Reather Littler D, NP    Inpatient Medications: Scheduled Meds:  aspirin EC  81 mg Oral Q breakfast   atorvastatin  80 mg Oral Daily   empagliflozin  25 mg Oral Daily   famotidine  20 mg Oral BID   fenofibrate  54 mg Oral Daily   furosemide  40 mg Intravenous Q12H   insulin aspart  0-15 Units Subcutaneous Q4H   metroNIDAZOLE  500 mg Oral Q12H   spironolactone  12.5 mg Oral Daily   Continuous Infusions:  ceFEPime (MAXIPIME) IV Stopped (05/05/23 8295)   heparin 2,000 Units/hr (05/05/23 1302)   vancomycin     vancomycin 2,000 mg (05/05/23 1229)   PRN Meds: acetaminophen **OR** acetaminophen, HYDROcodone-acetaminophen, morphine injection, ondansetron **OR** ondansetron (ZOFRAN) IV  Allergies:   No Known Allergies  Social History:   Social History   Socioeconomic History   Marital status: Single    Spouse name: Not on file   Number of children: Not on file   Years of education: Not on file   Highest education level: Not on file  Occupational History   Not on file  Tobacco Use   Smoking status: Never   Smokeless tobacco: Never  Vaping Use   Vaping status: Never Used  Substance and Sexual Activity   Alcohol use: Yes    Alcohol/week: 12.0 standard drinks of alcohol    Types: 12 Cans of beer per week    Comment: 5 beers a week   Drug use: No   Sexual activity: Yes    Comment: male 8 months partner  Other Topics Concern   Not on file  Social History Narrative   Not on file   Social Drivers of Health   Financial Resource Strain: Medium Risk  (10/07/2022)   Received from [facility], [facility]   Overall Financial Resource Strain (CARDIA)    Difficulty of Paying Living Expenses: Somewhat hard  Food Insecurity: No Food Insecurity (04/19/2023)   Hunger Vital Sign    Worried About Running Out of Food in the Last Year: Never true    Ran Out of Food in the Last Year: Never true  Transportation Needs: No Transportation Needs (04/19/2023)   PRAPARE - Administrator, Civil Service (Medical): No    Lack of Transportation (Non-Medical): No  Physical Activity: Not on file  Stress: Not on file  Social Connections: Not on file  Intimate Partner Violence: Not At Risk (04/19/2023)   Humiliation, Afraid, Rape, and Kick questionnaire    Fear of Current or Ex-Partner: No    Emotionally Abused: No    Physically Abused: No    Sexually Abused: No    Family History:    Family History  Problem Relation Age of Onset   Diabetes Mother    Stroke Mother    Early death Father    Heart attack Father        died from heart attack at the age of 64   Alcohol abuse Father    Heart disease Father    Breast cancer Sister    Hypertension Brother    Heart attack Maternal Grandfather        died from heart attack at the age of 28   Early death Paternal Grandfather    Heart attack Paternal Grandfather    Colon cancer Neg Hx    Esophageal cancer Neg Hx    Rectal cancer Neg Hx    Stomach cancer Neg Hx      ROS:  Please see the history of present illness.  All other ROS reviewed and negative.     Physical Exam/Data:   Vitals:   05/05/23 0804 05/05/23 1015 05/05/23 1105 05/05/23 1111  BP: 106/79 102/85  102/85  Pulse: 64   (!) 107  Resp: 18 16  16   Temp: 98.2 F (36.8 C)  98.2 F (36.8 C)   TempSrc: Oral  Oral   SpO2: 98%   93%  Intake/Output Summary (Last 24 hours) at 05/05/2023 1328 Last data filed at 05/05/2023 0958 Gross per 24 hour  Intake 100 ml  Output 750 ml  Net -650 ml      05/04/2023    4:04 PM  04/21/2023    4:17 AM 04/20/2023    4:28 AM  Last 3 Weights  Weight (lbs) 260 lb 265 lb 3.4 oz 262 lb 12.6 oz  Weight (kg) 117.935 kg 120.3 kg 119.2 kg     VS:  BP (!) 132/117   Pulse 72   Temp 98.2 F (36.8 C) (Oral)   Resp 18   SpO2 95%  , BMI There is no height or weight on file to calculate BMI. GENERAL:  Well appearing HEENT: Pupils equal round and reactive, fundi not visualized, oral mucosa unremarkable NECK:  + jugular venous distention, waveform within normal limits, carotid upstroke brisk and symmetric, no bruits, no thyromegaly LUNGS:  Clear to auscultation bilaterally HEART:  RRR.  PMI not displaced or sustained,S1 and S2 within normal limits, no S3, no S4, no clicks, no rubs, no murmurs ABD:  Flat, positive bowel sounds normal in frequency in pitch, no bruits, no rebound, no guarding, no midline pulsatile mass, no hepatomegaly, no splenomegaly EXT:  2 plus pulses throughout, 2+ edema, no cyanosis no clubbing SKIN:  Multiple lower extremity eschars with surrounding erythema  NEURO:  Cranial nerves II through XII grossly intact, motor grossly intact throughout PSYCH:  Cognitively intact, oriented to person place and time   EKG:  The EKG was personally reviewed and demonstrates:  Sinus tachycardia.  Rate 111.  PAC.  LAD.  Prior anterior infarct. Telemetry:  Telemetry was personally reviewed and demonstrates:  Sinus rhythm.  Relevant CV Studies:  Echo 05/05/23:  1. Left ventricular ejection fraction, by estimation, is 20%. The left  ventricle has severely decreased function. The left ventricle demonstrates  global hypokinesis. The left ventricular internal cavity size was mildly  dilated. Left ventricular  diastolic parameters are consistent with Grade III diastolic dysfunction  (restrictive). There was a thrombus at the LV apex.   2. Right ventricular systolic function is moderately reduced. The right  ventricular size is mildly enlarged. There is moderately elevated   pulmonary artery systolic pressure. The estimated right ventricular  systolic pressure is 59.6 mmHg.   3. Left atrial size was moderately dilated.   4. Right atrial size was mildly dilated.   5. The mitral valve is abnormal. Moderate mitral valve regurgitation. No  evidence of mitral stenosis.   6. The aortic valve is tricuspid. There is mild calcification of the  aortic valve. Aortic valve regurgitation is not visualized. No aortic  stenosis is present.   7. The inferior vena cava is dilated in size with <50% respiratory  variability, suggesting right atrial pressure of 15 mmHg.   LHC 11/24/2018:   Prox RCA to Mid RCA lesion is 95% stenosed. Dist RCA lesion is 30% stenosed. RPAV lesion is 100% stenosed. Dist Cx lesion is 99% stenosed. There is severe left ventricular systolic dysfunction. LV end diastolic pressure is severely elevated. The left ventricular ejection fraction is less than 25% by visual estimate. Ost LAD to Prox LAD lesion is 100% stenosed. Post intervention, there is a 0% residual stenosis. A drug-eluting stent was successfully placed using a STENT RESOLUTE ONYX 3.5X22. A drug-eluting stent was successfully placed using a STENT RESOLUTE ONYX [id number]. Mid LAD-1 lesion is 30% stenosed. Mid LAD-2 lesion is 100% stenosed. Post intervention, there is a  0% residual stenosis.   1.  Severe three-vessel coronary artery disease.  Occluded ostial and mid LAD likely due to late presenting anterior STEMI.  Significant distal left circumflex disease but the supplied territory is small.  Significant mid RCA stenosis with occluded posterior AV groove with left-to-right collaterals. 2.  Severely reduced LV systolic function with an EF of 20 to 25% with anterior wall akinesis.  LVEDP was 32 mmHg. 3.  Successful PCI and drug-eluting stent placement to the ostial as well as mid LAD.  Laboratory Data:  High Sensitivity Troponin:   Recent Labs  Lab 04/18/23 2030 04/18/23 2158   TROPONINIHS 51* 47*     Chemistry Recent Labs  Lab 05/04/23 1927  NA 132*  K 3.7  CL 101  CO2 20*  GLUCOSE 182*  BUN 20  CREATININE 1.22  CALCIUM 8.5*  GFRNONAA >60  ANIONGAP 11    Recent Labs  Lab 05/04/23 1927  PROT 7.1  ALBUMIN 3.4*  AST 33  ALT 28  ALKPHOS 79  BILITOT 1.0   Lipids No results for input(s): "CHOL", "TRIG", "HDL", "LABVLDL", "LDLCALC", "CHOLHDL" in the last 168 hours.  Hematology Recent Labs  Lab 05/04/23 1927  WBC 10.3  RBC 5.33  HGB 15.2  HCT 46.3  MCV 86.9  MCH 28.5  MCHC 32.8  RDW 14.5  PLT 312   Thyroid No results for input(s): "TSH", "FREET4" in the last 168 hours.  BNP Recent Labs  Lab 05/05/23 0125  BNP 1,045.5*    DDimer  Recent Labs  Lab 05/05/23 0520  DDIMER 1.73*    Radiology/Studies:  ECHOCARDIOGRAM COMPLETE Result Date: 05/05/2023    ECHOCARDIOGRAM REPORT   Patient Name:   Evan [name] Date of Exam: 05/05/2023 Medical Rec #:  [id number]      Height:       77.0 in Accession #:    [id number]     Weight:       260.0 lb Date of Birth:  [birth year]      BSA:          2.501 m Patient Age:    57 years       BP:           106/79 mmHg Patient Gender: M              HR:           100 bpm. Exam Location:  Inpatient Procedure: 2D Echo, Cardiac Doppler, Color Doppler and Intracardiac            Opacification Agent Indications:    CHF  History:        Patient has prior history of Echocardiogram examinations, most                 recent 08/23/2020. Cardiomyopathy, Previous Myocardial Infarction,                 Arrythmias:Atrial Fibrillation; Risk Factors:Diabetes,                 Hypertension and Sleep Apnea.  Sonographer:    Darlys Gales Referring Phys: [id number] HAZEL V DUNCAN IMPRESSIONS  1. Left ventricular ejection fraction, by estimation, is 20%. The left ventricle has severely decreased function. The left ventricle demonstrates global hypokinesis. The left ventricular internal cavity size was mildly dilated. Left ventricular diastolic  parameters are consistent with Grade III diastolic dysfunction (restrictive). There was a thrombus at the LV apex.  2. Right ventricular systolic function is moderately reduced.  The right ventricular size is mildly enlarged. There is moderately elevated pulmonary artery systolic pressure. The estimated right ventricular systolic pressure is 59.6 mmHg.  3. Left atrial size was moderately dilated.  4. Right atrial size was mildly dilated.  5. The mitral valve is abnormal. Moderate mitral valve regurgitation. No evidence of mitral stenosis.  6. The aortic valve is tricuspid. There is mild calcification of the aortic valve. Aortic valve regurgitation is not visualized. No aortic stenosis is present.  7. The inferior vena cava is dilated in size with <50% respiratory variability, suggesting right atrial pressure of 15 mmHg. FINDINGS  Left Ventricle: Left ventricular ejection fraction, by estimation, is 20%. The left ventricle has severely decreased function. The left ventricle demonstrates global hypokinesis. The left ventricular internal cavity size was mildly dilated. There is no left ventricular hypertrophy. Left ventricular diastolic parameters are consistent with Grade III diastolic dysfunction (restrictive). Right Ventricle: The right ventricular size is mildly enlarged. No increase in right ventricular wall thickness. Right ventricular systolic function is moderately reduced. There is moderately elevated pulmonary artery systolic pressure. The tricuspid regurgitant velocity is 3.34 m/s, and with an assumed right atrial pressure of 15 mmHg, the estimated right ventricular systolic pressure is 59.6 mmHg. Left Atrium: Left atrial size was moderately dilated. Right Atrium: Right atrial size was mildly dilated. Pericardium: Trivial pericardial effusion is present. Mitral Valve: The mitral valve is abnormal. Mild mitral annular calcification. Moderate mitral valve regurgitation. No evidence of mitral valve stenosis.  Tricuspid Valve: The tricuspid valve is normal in structure. Tricuspid valve regurgitation is mild. Aortic Valve: The aortic valve is tricuspid. There is mild calcification of the aortic valve. Aortic valve regurgitation is not visualized. No aortic stenosis is present. Aortic valve mean gradient measures 3.0 mmHg. Aortic valve peak gradient measures 4.5 mmHg. Aortic valve area, by VTI measures 2.32 cm. Pulmonic Valve: The pulmonic valve was normal in structure. Pulmonic valve regurgitation is not visualized. Aorta: The aortic root is normal in size and structure. Venous: The inferior vena cava is dilated in size with less than 50% respiratory variability, suggesting right atrial pressure of 15 mmHg. IAS/Shunts: No atrial level shunt detected by color flow Doppler.  LEFT VENTRICLE PLAX 2D LVIDd:         6.20 cm LVIDs:         5.80 cm LV PW:         0.90 cm LV IVS:        0.60 cm LVOT diam:     2.10 cm LV SV:         33 LV SV Index:   13 LVOT Area:     3.46 cm  RIGHT VENTRICLE RV S prime:     10.30 cm/s TAPSE (M-mode): 0.8 cm LEFT ATRIUM              Index        RIGHT ATRIUM           Index LA Vol (A2C):   155.0 ml 61.98 ml/m  RA Area:     22.10 cm LA Vol (A4C):   117.0 ml 46.79 ml/m  RA Volume:   71.30 ml  28.51 ml/m LA Biplane Vol: 135.0 ml 53.99 ml/m  AORTIC VALVE AV Area (Vmax):    2.05 cm AV Area (Vmean):   2.03 cm AV Area (VTI):     2.32 cm AV Vmax:           106.00 cm/s AV Vmean:  75.700 cm/s AV VTI:            0.143 m AV Peak Grad:      4.5 mmHg AV Mean Grad:      3.0 mmHg LVOT Vmax:         62.70 cm/s LVOT Vmean:        44.400 cm/s LVOT VTI:          0.096 m LVOT/AV VTI ratio: 0.67  AORTA Ao Root diam: 3.20 cm MITRAL VALVE                TRICUSPID VALVE MV Area (PHT): 6.17 cm     TR Peak grad:   44.6 mmHg MV Decel Time: 123 msec     TR Vmax:        334.00 cm/s MV E velocity: 110.00 cm/s                             SHUNTS                             Systemic VTI:  0.10 m                              Systemic Diam: 2.10 cm Dalton McleanMD Electronically signed by Wilfred Lacy Signature Date/Time: 05/05/2023/11:36:02 AM    Final    VAS [place] LOWER EXTREMITY VENOUS (DVT) Result Date: 05/05/2023  Lower Venous DVT Study Patient Name:  Evan [name]  Date of Exam:   05/05/2023 Medical Rec #: [id number]       Accession #:    [id number] Date of Birth: 14-Sep-1965       Patient Gender: M Patient Age:   32 years Exam Location:  [facility] Procedure:      VAS [place] LOWER EXTREMITY VENOUS (DVT) Referring Phys: Alwyn Ren GONFA --------------------------------------------------------------------------------  Indications: Elevated Ddimer.  Risk Factors: None identified. Limitations: Open wound. Comparison Study: No prior studies. Performing Technologist: Chanda Busing RVT  Examination Guidelines: A complete evaluation includes B-mode imaging, spectral Doppler, color Doppler, and power Doppler as needed of all accessible portions of each vessel. Bilateral testing is considered an integral part of a complete examination. Limited examinations for reoccurring indications may be performed as noted. The reflux portion of the exam is performed with the patient in reverse Trendelenburg.  +---------+---------------+---------+-----------+----------+--------------+ RIGHT    CompressibilityPhasicitySpontaneityPropertiesThrombus Aging +---------+---------------+---------+-----------+----------+--------------+ CFV      Full           Yes      Yes                                 +---------+---------------+---------+-----------+----------+--------------+ SFJ      Full                                                        +---------+---------------+---------+-----------+----------+--------------+ FV Prox  Full                                                        +---------+---------------+---------+-----------+----------+--------------+  FV Mid   Full                                                         +---------+---------------+---------+-----------+----------+--------------+ FV DistalFull                                                        +---------+---------------+---------+-----------+----------+--------------+ PFV      Full                                                        +---------+---------------+---------+-----------+----------+--------------+ POP      Full           Yes      Yes                                 +---------+---------------+---------+-----------+----------+--------------+ PTV      Full                                                        +---------+---------------+---------+-----------+----------+--------------+ PERO     Full                                                        +---------+---------------+---------+-----------+----------+--------------+   +---------+---------------+---------+-----------+----------+--------------+ LEFT     CompressibilityPhasicitySpontaneityPropertiesThrombus Aging +---------+---------------+---------+-----------+----------+--------------+ CFV      Full           Yes      Yes                                 +---------+---------------+---------+-----------+----------+--------------+ SFJ      Full                                                        +---------+---------------+---------+-----------+----------+--------------+ FV Prox  Full                                                        +---------+---------------+---------+-----------+----------+--------------+ FV Mid   Full                                                        +---------+---------------+---------+-----------+----------+--------------+  FV DistalFull                                                        +---------+---------------+---------+-----------+----------+--------------+ PFV      Full                                                         +---------+---------------+---------+-----------+----------+--------------+ POP      Full           Yes      Yes                                 +---------+---------------+---------+-----------+----------+--------------+ PTV      Full                                                        +---------+---------------+---------+-----------+----------+--------------+ PERO     Full                                                        +---------+---------------+---------+-----------+----------+--------------+    Summary: RIGHT: - There is no evidence of deep vein thrombosis in the lower extremity. However, portions of this examination were limited- see technologist comments above.  - No cystic structure found in the popliteal fossa.  LEFT: - There is no evidence of deep vein thrombosis in the lower extremity. However, portions of this examination were limited- see technologist comments above.  - No cystic structure found in the popliteal fossa.  *See table(s) above for measurements and observations.    Preliminary    VAS [place] ABI WITH/WO TBI Result Date: 05/05/2023  LOWER EXTREMITY DOPPLER STUDY Patient Name:  Evan [name]  Date of Exam:   05/05/2023 Medical Rec #: [id number]       Accession #:    [id number] Date of Birth: 1965-06-26       Patient Gender: M Patient Age:   58 years Exam Location:  [facility] Procedure:      VAS [place] ABI WITH/WO TBI Referring Phys: ELIZABETH REES --------------------------------------------------------------------------------  Indications: Rest pain, and ulceration. High Risk Factors: Hypertension, hyperlipidemia, Diabetes.  Limitations: Today's exam was limited due to an open wound and involuntary              patient movement. Comparison Study: No prior studies. Performing Technologist: Olen Cordial RVT  Examination Guidelines: A complete evaluation includes at minimum, Doppler waveform signals and systolic blood pressure reading at the level of bilateral  brachial, anterior tibial, and posterior tibial arteries, when vessel segments are accessible. Bilateral testing is considered an integral part of a complete examination. Photoelectric Plethysmograph (PPG) waveforms and toe systolic pressure readings are included as required and additional duplex testing as needed. Limited examinations for reoccurring indications may be performed  as noted.  ABI Findings: +---------+------------------+-----+-----------+--------+ Right    Rt Pressure (mmHg)IndexWaveform   Comment  +---------+------------------+-----+-----------+--------+ Brachial 95                     triphasic           +---------+------------------+-----+-----------+--------+ PTA      119               1.10 multiphasic         +---------+------------------+-----+-----------+--------+ DP       109               1.01 multiphasic         +---------+------------------+-----+-----------+--------+ Great Toe70                0.65                     +---------+------------------+-----+-----------+--------+ +---------+------------------+-----+-----------+-------+ Left     Lt Pressure (mmHg)IndexWaveform   Comment +---------+------------------+-----+-----------+-------+ Brachial 108                    triphasic          +---------+------------------+-----+-----------+-------+ PTA      112               1.04 multiphasic        +---------+------------------+-----+-----------+-------+ DP       241               2.23 multiphasic        +---------+------------------+-----+-----------+-------+ Great Toe83                0.77                    +---------+------------------+-----+-----------+-------+ +-------+-----------+-----------+------------+------------+ ABI/TBIToday's ABIToday's TBIPrevious ABIPrevious TBI +-------+-----------+-----------+------------+------------+ Right  1.1        0.65                                 +-------+-----------+-----------+------------+------------+ Left   [place]         0.77                                +-------+-----------+-----------+------------+------------+  Summary: Right: Resting right ankle-brachial index is within normal range. The right toe-brachial index is abnormal. Left: Resting left ankle-brachial index indicates noncompressible left lower extremity arteries. The left toe-brachial index is normal. *See table(s) above for measurements and observations.     Preliminary    DG Tibia/Fibula Right Result Date: 05/04/2023 CLINICAL DATA:  Diabetic wounds.  Pain and redness. EXAM: RIGHT TIBIA AND FIBULA - 2 VIEW COMPARISON:  None Available. FINDINGS: There is no evidence of fracture or other focal bone lesions. No erosive change or periostitis. Degenerative change of the knee and ankle. Chondrocalcinosis of the knee. Areas of subcutaneous edema, no soft tissue gas or radiopaque foreign body. IMPRESSION: 1. Subcutaneous edema. No soft tissue gas or radiopaque foreign body. 2. No radiographic evidence of osteomyelitis. 3. Degenerative change of the knee and ankle. Electronically Signed   By: Narda Rutherford M.D.   On: 05/04/2023 21:03   DG Tibia/Fibula Left Result Date: 05/04/2023 CLINICAL DATA:  Bilateral lower extremity diabetic wounds EXAM: LEFT TIBIA AND FIBULA - 2 VIEW COMPARISON:  None Available. FINDINGS: Normal alignment. No acute fracture or dislocation. Degenerative changes are noted within the left knee and left  ankle. No osseous erosions or abnormal periosteal reaction. Mild vascular calcification noted. IMPRESSION: 1. Degenerative changes. No radiographic evidence of osteomyelitis. Electronically Signed   By: Helyn Numbers M.D.   On: 05/04/2023 21:02    Assessment and Plan:   # Acute on chronic systolic and diastolic heart failure:  # Hypertension:  Mr. Zeltner is volume overloaded on exam.  LVEF 20% on echo this admission.  Reduced from 35-40% in 2022.  It was  25-30% when first diagnosed with heart failure. He was taking his medications at home as prescribed.  Recommend lasix 40mg  IV q12h. Continue Jardiance and spironolactone.  He doesn't have any ischemic symptoms at this time.  No plans for further ischemic evaluation at this time.    # Left ventricular thrombus:  Noted on echo this admission.  No evidence of stroke clinically.  On IV heparin for now.  Plan to transition to Eliquis or Xarelto prior to discharge.   # CAD: # Hyperlipidemia:  Continue aspirin, fenofibrate and atorvastatin. Metoprolol has been held until Utox returns to rule out cocaine.   Risk Assessment/Risk Scores:        New York Heart Association (NYHA) Functional Class NYHA Class III        For questions or updates, please contact [facility] Please consult [email or web address] for contact info under    Signed, Chilton Si, MD  05/05/2023 1:28 PM

## 2023-05-06 ENCOUNTER — Other Ambulatory Visit: Payer: Self-pay | Admitting: Infectious Disease

## 2023-05-06 ENCOUNTER — Inpatient Hospital Stay (HOSPITAL_COMMUNITY): Payer: Medicaid Other

## 2023-05-06 DIAGNOSIS — L97919 Non-pressure chronic ulcer of unspecified part of right lower leg with unspecified severity: Secondary | ICD-10-CM | POA: Diagnosis not present

## 2023-05-06 DIAGNOSIS — I776 Arteritis, unspecified: Secondary | ICD-10-CM | POA: Diagnosis not present

## 2023-05-06 DIAGNOSIS — L97929 Non-pressure chronic ulcer of unspecified part of left lower leg with unspecified severity: Secondary | ICD-10-CM | POA: Diagnosis not present

## 2023-05-06 DIAGNOSIS — I2510 Atherosclerotic heart disease of native coronary artery without angina pectoris: Secondary | ICD-10-CM | POA: Diagnosis not present

## 2023-05-06 DIAGNOSIS — Z9861 Coronary angioplasty status: Secondary | ICD-10-CM | POA: Diagnosis not present

## 2023-05-06 DIAGNOSIS — I5043 Acute on chronic combined systolic (congestive) and diastolic (congestive) heart failure: Secondary | ICD-10-CM

## 2023-05-06 DIAGNOSIS — I509 Heart failure, unspecified: Secondary | ICD-10-CM | POA: Diagnosis not present

## 2023-05-06 DIAGNOSIS — I517 Cardiomegaly: Secondary | ICD-10-CM | POA: Diagnosis not present

## 2023-05-06 LAB — BASIC METABOLIC PANEL
Anion gap: 10 (ref 5–15)
BUN: 24 mg/dL — ABNORMAL HIGH (ref 6–20)
CO2: 22 mmol/L (ref 22–32)
Calcium: 8.5 mg/dL — ABNORMAL LOW (ref 8.9–10.3)
Chloride: 100 mmol/L (ref 98–111)
Creatinine, Ser: 1.15 mg/dL (ref 0.61–1.24)
GFR, Estimated: >60 mL/min (ref >60)
Glucose, Bld: 171 mg/dL — ABNORMAL HIGH (ref 70–99)
Potassium: 3.9 mmol/L (ref 3.5–5.1)
Sodium: 132 mmol/L — ABNORMAL LOW (ref 135–145)

## 2023-05-06 LAB — RENAL FUNCTION PANEL
Albumin: 3 g/dL — ABNORMAL LOW (ref 3.5–5.0)
Anion gap: 8 (ref 5–15)
BUN: 24 mg/dL — ABNORMAL HIGH (ref 6–20)
CO2: 25 mmol/L (ref 22–32)
Calcium: 8.1 mg/dL — ABNORMAL LOW (ref 8.9–10.3)
Chloride: 101 mmol/L (ref 98–111)
Creatinine, Ser: 1.16 mg/dL (ref 0.61–1.24)
GFR, Estimated: >60 mL/min (ref >60)
Glucose, Bld: 135 mg/dL — ABNORMAL HIGH (ref 70–99)
Phosphorus: 3.6 mg/dL (ref 2.5–4.6)
Potassium: 3 mmol/L — ABNORMAL LOW (ref 3.5–5.1)
Sodium: 134 mmol/L — ABNORMAL LOW (ref 135–145)

## 2023-05-06 LAB — CBC
HCT: 44.9 % (ref 39.0–52.0)
Hemoglobin: 14.7 g/dL (ref 13.0–17.0)
MCH: 28.6 pg (ref 26.0–34.0)
MCHC: 32.7 g/dL (ref 30.0–36.0)
MCV: 87.4 fL (ref 80.0–100.0)
Platelets: 270 10*3/uL (ref 150–400)
RBC: 5.14 MIL/uL (ref 4.22–5.81)
RDW: 14.6 % (ref 11.5–15.5)
WBC: 8.6 10*3/uL (ref 4.0–10.5)
nRBC: 0 % (ref 0.0–0.2)

## 2023-05-06 LAB — HEPARIN LEVEL (UNFRACTIONATED): Heparin Unfractionated: 0.48 [IU]/mL (ref 0.30–0.70)

## 2023-05-06 LAB — ANA W/REFLEX IF POSITIVE: Anti Nuclear Antibody (ANA): NEGATIVE

## 2023-05-06 LAB — GLUCOSE, CAPILLARY
Glucose-Capillary: 177 mg/dL — ABNORMAL HIGH (ref 70–99)
Glucose-Capillary: 137 mg/dL — ABNORMAL HIGH (ref 70–99)

## 2023-05-06 LAB — CBG MONITORING, ED
Glucose-Capillary: 147 mg/dL — ABNORMAL HIGH (ref 70–99)
Glucose-Capillary: 111 mg/dL — ABNORMAL HIGH (ref 70–99)
Glucose-Capillary: 181 mg/dL — ABNORMAL HIGH (ref 70–99)

## 2023-05-06 LAB — RHEUMATOID FACTOR: Rheumatoid fact SerPl-aCnc: <10 [IU]/mL (ref <14.0)

## 2023-05-06 LAB — MAGNESIUM: Magnesium: 2 mg/dL (ref 1.7–2.4)

## 2023-05-06 MED ORDER — ORAL CARE MOUTH RINSE: 15.0000 mL | OROMUCOSAL | Status: DC | PRN

## 2023-05-06 MED ORDER — FUROSEMIDE 10 MG/ML IJ SOLN
80.0000 mg | Freq: Two times a day (BID) | INTRAMUSCULAR | Status: DC
  Administered 2023-05-06 – 2023-05-08 (×4): 80 mg via INTRAVENOUS
  Filled 2023-05-06 (×5): qty 8

## 2023-05-06 MED ORDER — POTASSIUM CHLORIDE 20 MEQ PO PACK
40.0000 meq | PACK | Freq: Once | ORAL | Status: AC
  Administered 2023-05-06: 40 meq via ORAL
  Filled 2023-05-06: qty 2

## 2023-05-06 MED ORDER — LIDOCAINE HCL (PF) 2 % IJ SOLN: 0.0000 mL | Freq: Once | INTRAMUSCULAR | Status: DC | PRN

## 2023-05-06 MED ORDER — LIDOCAINE HCL 1 % IJ SOLN
5.0000 mL | Freq: Once | INTRAMUSCULAR | Status: AC
  Administered 2023-05-06: 5 mL via INTRADERMAL
  Filled 2023-05-06: qty 20

## 2023-05-06 MED ORDER — MUPIROCIN CALCIUM 2 % EX CREA
TOPICAL_CREAM | Freq: Two times a day (BID) | CUTANEOUS | Status: DC
  Filled 2023-05-06 (×2): qty 15

## 2023-05-06 NOTE — Progress Notes (Signed)
Procedure: punch biopsies of 2 sites:   Indication: Vasculitic skin lesions  Permission: Informed consent obtained and documented in the chart risk of the procedure including bleeding and infection explained benefits including arriving had a firm diagnosis with regards to his vasculitic pathology explained.  Patient consented to the procedure and agreed to go forward.  1% lidocaine was copiously ducted and irrigated into 1 quite necrotic lesion on the left leg.  After 3 minutes of time for the numbing to kick in I obtained a punch biopsy here which was sent in formalin to the pathology lab.  I infiltrated a second site with lidocaine and took a punch biopsy from the edge of the necrotic lesion.  This was sent in separate specimen so that special stains could be obtained.  Estimated blood loss minimal

## 2023-05-06 NOTE — Progress Notes (Addendum)
Patient Name: Evan [name] Date of Encounter: 05/06/2023 [facility] Cardiologist: Nanetta Batty, MD   Interval Summary  .    Reports no significant complaints.  No chest pain or shortness of breath.  States that his peripheral edema is generally unchanged.  Still volume up, tachycardic today.  Vital Signs .    Vitals:   05/06/23 0400 05/06/23 0530 05/06/23 0600 05/06/23 0903  BP: 111/84  109/84 94/81  Pulse: (!) 101  (!) 51 (!) 107  Resp: 17  18 16   Temp:  97.6 F (36.4 C)  97.7 F (36.5 C)  TempSrc:    Oral  SpO2: 97%  99% 98%    Intake/Output Summary (Last 24 hours) at 05/06/2023 1017 Last data filed at 05/06/2023 0724 Gross per 24 hour  Intake 813.4 ml  Output --  Net 813.4 ml      05/04/2023    4:04 PM 04/21/2023    4:17 AM 04/20/2023    4:28 AM  Last 3 Weights  Weight (lbs) 260 lb 265 lb 3.4 oz 262 lb 12.6 oz  Weight (kg) 117.935 kg 120.3 kg 119.2 kg      Telemetry/ECG    Sinus heart rates between 100-110 with PVCs.- Personally Reviewed  CV Studies    Echocardiogram 05/05/2023  1. Left ventricular ejection fraction, by estimation, is 20%. The left  ventricle has severely decreased function. The left ventricle demonstrates  global hypokinesis. The left ventricular internal cavity size was mildly  dilated. Left ventricular  diastolic parameters are consistent with Grade III diastolic dysfunction  (restrictive). There was a thrombus at the LV apex.   2. Right ventricular systolic function is moderately reduced. The right  ventricular size is mildly enlarged. There is moderately elevated  pulmonary artery systolic pressure. The estimated right ventricular  systolic pressure is 59.6 mmHg.   3. Left atrial size was moderately dilated.   4. Right atrial size was mildly dilated.   5. The mitral valve is abnormal. Moderate mitral valve regurgitation. No  evidence of mitral stenosis.   6. The aortic valve is tricuspid. There is mild  calcification of the  aortic valve. Aortic valve regurgitation is not visualized. No aortic  stenosis is present.   7. The inferior vena cava is dilated in size with <50% respiratory  variability, suggesting right atrial pressure of 15 mmHg.   Left heart catheterization 11/24/2018 Prox RCA to Mid RCA lesion is 95% stenosed. Dist RCA lesion is 30% stenosed. RPAV lesion is 100% stenosed. Dist Cx lesion is 99% stenosed. There is severe left ventricular systolic dysfunction. LV end diastolic pressure is severely elevated. The left ventricular ejection fraction is less than 25% by visual estimate. Ost LAD to Prox LAD lesion is 100% stenosed. Post intervention, there is a 0% residual stenosis. A drug-eluting stent was successfully placed using a STENT RESOLUTE ONYX 3.5X22. A drug-eluting stent was successfully placed using a STENT RESOLUTE ONYX [id number]. Mid LAD-1 lesion is 30% stenosed. Mid LAD-2 lesion is 100% stenosed. Post intervention, there is a 0% residual stenosis.   1.  Severe three-vessel coronary artery disease.  Occluded ostial and mid LAD likely due to late presenting anterior STEMI.  Significant distal left circumflex disease but the supplied territory is small.  Significant mid RCA stenosis with occluded posterior AV groove with left-to-right collaterals. 2.  Severely reduced LV systolic function with an EF of 20 to 25% with anterior wall akinesis.  LVEDP was 32 mmHg. 3.  Successful PCI and  drug-eluting stent placement to the ostial as well as mid LAD.   Staged procedure 11/26/2018 Previously placed Ost LAD to Prox LAD drug eluting stent is widely patent. Balloon angioplasty was performed. Previously placed Mid LAD-2 drug eluting stent is widely patent. Balloon angioplasty was performed. Mid LAD-1 lesion is 30% stenosed. Dist Cx lesion is 99% stenosed. Prox RCA to Mid RCA lesion is 95% stenosed. Dist RCA lesion is 30% stenosed. RPAV lesion is 100% stenosed. Post intervention,  there is a 0% residual stenosis. A drug-eluting stent was successfully placed using a STENT RESOLUTE ONYX 2.75X18.   1.  Widely patent LAD stents with unchanged coronary anatomy. 2.  LVEDP was 13 mmHg with mild hypotension.  The patient was given 250 normal saline bolus. 3.  Successful angioplasty and drug-eluting stent placement to the proximal/mid right coronary artery.   Physical Exam .   GEN: No acute distress.   Neck: + JVD Cardiac: RRR, no murmurs, rubs, or gallops.  Anterior erythema of his chest Respiratory: Clear to auscultation bilaterally. GI: Soft, nontender, non-distended  MS: 2+ edema  Diffusely on lower extremity he has necrotic ulcerations that are weeping and erythematous.  Multiple lesions anywhere between 2 to 6 cm roughly  Patient Profile    Evan [name] is a 57 y.o. male has hx of  CAD status post PCI 11/2018 (DES to LAD and R PDA),ischemic cardiomyopathy (LVEF 35%), polysubstance abuse, PAF, OSA, and diabetes  and admitted on 05/05/2023.  Currently patient being evaluated for necrotic looking leg wounds, acute on chronic HFrEF with worsening EF, LV thrombus.  Previous LVEF was 35 to 40%.  Assessment & Plan .     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  Moderately reduced RV function with a RVSP of 59.6.  With new LV thrombus cannot exclude possible infarct however cannot also exclude cocaine induced cardiomyopathy.  No obvious evidence of ischemic symptoms and with concurrent cocaine high risk cath candidate.  For now we will medically manage with no plans for ischemic evaluation for the time being.  Still volume up, inaccurate I's and O's but no significant shortness of breath. Currently on IV Lasix 40 mg twice daily.  Unsure of his volume status yesterday, may need to increase this Continue Jardiance 25 mg,  spironolactone 12.5 mg.  Reports compliancy with meds.  New LV thrombus Noted on surface echocardiogram medically managing with IV heparin.  Transition back to DOAC at discharge.  Defer to MD duration of IV heparin  CAD status post DES to proximal LAD and staged RCA 2020 No anginal symptoms here. Continue aspirin, atorvastatin, fenofibrate.  Toprol-XL being held due to cocaine and soft pressures.  Polysubstance abuse UDS positive for cocaine, opioids, amphetamines.  Reports 30+ year history of cocaine.  Hypokalemia 3.0 today.  Will supplement  Vasculitic skin lesions on lower extremity Manage per infectious disease.  Recommended transfer to tertiary facility.  Reports these been chronic for at least 2 to 3 months.   For questions or updates, please contact [facility] Please consult [email or web address] for contact info under        Signed, Abagail Kitchens, PA-C

## 2023-05-06 NOTE — Progress Notes (Signed)
Subjective: No new complaints   Antibiotics:  Anti-infectives (From admission, onward)    Start     Dose/Rate Route Frequency Ordered Stop   05/05/23 2200  Vancomycin (VANCOCIN) 1,500 mg in sodium chloride 0.9 % 500 mL IVPB  Status:  Discontinued        1,500 mg 250 mL/hr over 120 Minutes Intravenous 2 times daily 05/05/23 1120 05/05/23 1625   05/05/23 1130  vancomycin (VANCOREADY) IVPB 2000 mg/400 mL        2,000 mg 200 mL/hr over 120 Minutes Intravenous  Once 05/05/23 1120 05/05/23 1432   05/05/23 0930  ceFEPIme (MAXIPIME) 2 g in sodium chloride 0.9 % 100 mL IVPB  Status:  Discontinued        2 g 200 mL/hr over 30 Minutes Intravenous Every 8 hours 05/05/23 0759 05/05/23 1625   05/05/23 0115  ceFEPIme (MAXIPIME) 2 g in sodium chloride 0.9 % 100 mL IVPB        2 g 200 mL/hr over 30 Minutes Intravenous  Once 05/05/23 0106 05/05/23 0203   05/05/23 0100  metroNIDAZOLE (FLAGYL) tablet 500 mg  Status:  Discontinued        500 mg Oral Every 12 hours 05/05/23 0057 05/05/23 1625       Medications: Scheduled Meds:  aspirin EC  81 mg Oral Q breakfast   atorvastatin  80 mg Oral Daily   empagliflozin  25 mg Oral Daily   famotidine  20 mg Oral BID   fenofibrate  54 mg Oral Daily   furosemide  40 mg Intravenous Q12H   insulin aspart  0-15 Units Subcutaneous Q4H   spironolactone  12.5 mg Oral Daily   Continuous Infusions:  heparin 2,000 Units/hr (05/06/23 0724)   PRN Meds:.acetaminophen **OR** acetaminophen, HYDROcodone-acetaminophen, morphine injection, ondansetron **OR** ondansetron (ZOFRAN) IV    Objective: Weight change:   Intake/Output Summary (Last 24 hours) at 05/06/2023 1217 Last data filed at 05/06/2023 0724 Gross per 24 hour  Intake 813.4 ml  Output --  Net 813.4 ml   Blood pressure 113/76, pulse (!) 105, temperature 97.7 F (36.5 C), temperature source Oral, resp. rate (!) 21, SpO2 97%. Temp:  [97.5 F (36.4 C)-98 F (36.7 C)] 97.7 F (36.5 C)  (12/18 0903) Pulse Rate:  [51-111] 105 (12/18 1000) Resp:  [16-24] 21 (12/18 1000) BP: (94-132)/(76-117) 113/76 (12/18 1000) SpO2:  [95 %-100 %] 97 % (12/18 1000)  Physical Exam: Physical Exam Constitutional:      Appearance: He is well-developed.  HENT:     Head: Normocephalic and atraumatic.  Eyes:     Conjunctiva/sclera: Conjunctivae normal.  Cardiovascular:     Rate and Rhythm: Normal rate and regular rhythm.  Pulmonary:     Effort: Pulmonary effort is normal. No respiratory distress.     Breath sounds: No wheezing.  Abdominal:     General: There is no distension.     Palpations: Abdomen is soft.  Musculoskeletal:        General: Normal range of motion.     Cervical back: Normal range of motion and neck supple.  Skin:    General: Skin is warm and dry.     Findings: Rash present. No erythema.  Neurological:     General: No focal deficit present.     Mental Status: He is alert and oriented to person, place, and time.  Psychiatric:        Mood and Affect: Mood normal.  Behavior: Behavior normal.        Thought Content: Thought content normal.        Judgment: Judgment normal.     Vasculitic lesions on bilateral lower extremities 05/06/2023:        CBC:    BMET Recent Labs    05/04/23 1927 05/06/23 0530  NA 132* 134*  K 3.7 3.0*  CL 101 101  CO2 20* 25  GLUCOSE 182* 135*  BUN 20 24*  CREATININE 1.22 1.16  CALCIUM 8.5* 8.1*     Liver Panel  Recent Labs    05/04/23 1927 05/06/23 0530  PROT 7.1  --   ALBUMIN 3.4* 3.0*  AST 33  --   ALT 28  --   ALKPHOS 79  --   BILITOT 1.0  --        Sedimentation Rate Recent Labs    05/05/23 1700  ESRSEDRATE 3   C-Reactive Protein Recent Labs    05/05/23 0125 05/05/23 1700  CRP 2.5* 2.0*    Micro Results: Recent Results (from the past 720 hours)  MRSA Next Gen by PCR, Nasal     Status: Abnormal   Collection Time: 04/19/23  8:00 AM   Specimen: Nasal Mucosa; Nasal Swab  Result  Value Ref Range Status   MRSA by PCR Next Gen DETECTED (A) NOT DETECTED Final    Comment: RESULT CALLED TO, READ BACK BY AND VERIFIED WITH: C TARPIN AT 1101 ON [id number] BY S DALTON (NOTE) The GeneXpert MRSA Assay (FDA approved for NASAL specimens only), is one component of a comprehensive MRSA colonization surveillance program. It is not intended to diagnose MRSA infection nor to guide or monitor treatment for MRSA infections. Test performance is not FDA approved in patients less than 93 years old. Performed at [facility], [address], [place], [place] [address]   Blood Cultures x 2 sites     Status: None (Preliminary result)   Collection Time: 05/05/23 12:05 AM   Specimen: BLOOD  Result Value Ref Range Status   Specimen Description   Final    BLOOD LEFT ANTECUBITAL Performed at [facility], [address], [place], [place] [address]    Special Requests   Final    BOTTLES DRAWN AEROBIC AND ANAEROBIC Blood Culture adequate volume Performed at [facility], [address], [place], [place] [address]    Culture   Final    NO GROWTH 1 DAY Performed at [facility], [address], [place], [place] [address]    Report Status PENDING  Incomplete  Blood Cultures x 2 sites     Status: None (Preliminary result)   Collection Time: 05/05/23 12:17 AM   Specimen: BLOOD  Result Value Ref Range Status   Specimen Description   Final    BLOOD RIGHT ANTECUBITAL Performed at [facility], [address] [place], [place] [address]    Special Requests   Final    BOTTLES DRAWN AEROBIC AND ANAEROBIC Blood Culture adequate volume Performed at [facility] [place] [facility], [address], [place], [place] [address]    Culture   Final    NO GROWTH 1 DAY Performed at [facility], [address] [place], [place] [address]    Report Status PENDING  Incomplete    Studies/Results: DG Chest Port 1 View Result Date:  05/06/2023 CLINICAL DATA:  Congestive heart failure. EXAM: PORTABLE CHEST 1 VIEW COMPARISON:  04/18/2023 FINDINGS: Unchanged cardiac enlargement. No  signs of pleural effusion, interstitial edema or airspace disease. Visualized osseous structures are unremarkable. IMPRESSION: Cardiomegaly. No acute findings. Electronically Signed   By: Signa Kell M.D.   On: 05/06/2023 06:10   VAS [place] LOWER EXTREMITY VENOUS (DVT) Result Date: 05/05/2023  Lower Venous DVT Study Patient Name:  Evan [name]  Date of Exam:   05/05/2023 Medical Rec #: [id number]       Accession #:    [id number] Date of Birth: [birth year]       Patient Gender: M Patient Age:   13 years Exam Location:  [facility] Procedure:      VAS [place] LOWER EXTREMITY VENOUS (DVT) Referring Phys: Alwyn Ren GONFA --------------------------------------------------------------------------------  Indications: Elevated Ddimer.  Risk Factors: None identified. Limitations: Open wound. Comparison Study: No prior studies. Performing Technologist: Chanda Busing RVT  Examination Guidelines: A complete evaluation includes B-mode imaging, spectral Doppler, color Doppler, and power Doppler as needed of all accessible portions of each vessel. Bilateral testing is considered an integral part of a complete examination. Limited examinations for reoccurring indications may be performed as noted. The reflux portion of the exam is performed with the patient in reverse Trendelenburg.  +---------+---------------+---------+-----------+----------+--------------+ RIGHT    CompressibilityPhasicitySpontaneityPropertiesThrombus Aging +---------+---------------+---------+-----------+----------+--------------+ CFV      Full           Yes      Yes                                 +---------+---------------+---------+-----------+----------+--------------+ SFJ      Full                                                         +---------+---------------+---------+-----------+----------+--------------+ FV Prox  Full                                                        +---------+---------------+---------+-----------+----------+--------------+ FV Mid   Full                                                        +---------+---------------+---------+-----------+----------+--------------+ FV DistalFull                                                        +---------+---------------+---------+-----------+----------+--------------+ PFV      Full                                                        +---------+---------------+---------+-----------+----------+--------------+ POP      Full           Yes      Yes                                 +---------+---------------+---------+-----------+----------+--------------+  PTV      Full                                                        +---------+---------------+---------+-----------+----------+--------------+ PERO     Full                                                        +---------+---------------+---------+-----------+----------+--------------+   +---------+---------------+---------+-----------+----------+--------------+ LEFT     CompressibilityPhasicitySpontaneityPropertiesThrombus Aging +---------+---------------+---------+-----------+----------+--------------+ CFV      Full           Yes      Yes                                 +---------+---------------+---------+-----------+----------+--------------+ SFJ      Full                                                        +---------+---------------+---------+-----------+----------+--------------+ FV Prox  Full                                                        +---------+---------------+---------+-----------+----------+--------------+ FV Mid   Full                                                         +---------+---------------+---------+-----------+----------+--------------+ FV DistalFull                                                        +---------+---------------+---------+-----------+----------+--------------+ PFV      Full                                                        +---------+---------------+---------+-----------+----------+--------------+ POP      Full           Yes      Yes                                 +---------+---------------+---------+-----------+----------+--------------+ PTV      Full                                                        +---------+---------------+---------+-----------+----------+--------------+  PERO     Full                                                        +---------+---------------+---------+-----------+----------+--------------+     Summary: RIGHT: - There is no evidence of deep vein thrombosis in the lower extremity. However, portions of this examination were limited- see technologist comments above.  - No cystic structure found in the popliteal fossa.  LEFT: - There is no evidence of deep vein thrombosis in the lower extremity. However, portions of this examination were limited- see technologist comments above.  - No cystic structure found in the popliteal fossa.  *See table(s) above for measurements and observations. Electronically signed by Lemar Livings MD on 05/05/2023 at 1:38:32 PM.    Final    VAS [place] ABI WITH/WO TBI Result Date: 05/05/2023  LOWER EXTREMITY DOPPLER STUDY Patient Name:  Evan [name]  Date of Exam:   05/05/2023 Medical Rec #: [id number]       Accession #:    [id number] Date of Birth: Oct 16, 1965       Patient Gender: M Patient Age:   43 years Exam Location:  [facility] Procedure:      VAS [place] ABI WITH/WO TBI Referring Phys: ELIZABETH REES --------------------------------------------------------------------------------  Indications: Rest pain, and ulceration. High Risk Factors:  Hypertension, hyperlipidemia, Diabetes.  Limitations: Today's exam was limited due to an open wound and involuntary              patient movement. Comparison Study: No prior studies. Performing Technologist: Olen Cordial RVT  Examination Guidelines: A complete evaluation includes at minimum, Doppler waveform signals and systolic blood pressure reading at the level of bilateral brachial, anterior tibial, and posterior tibial arteries, when vessel segments are accessible. Bilateral testing is considered an integral part of a complete examination. Photoelectric Plethysmograph (PPG) waveforms and toe systolic pressure readings are included as required and additional duplex testing as needed. Limited examinations for reoccurring indications may be performed as noted.  ABI Findings: +---------+------------------+-----+-----------+--------+ Right    Rt Pressure (mmHg)IndexWaveform   Comment  +---------+------------------+-----+-----------+--------+ Brachial 95                     triphasic           +---------+------------------+-----+-----------+--------+ PTA      119               1.10 multiphasic         +---------+------------------+-----+-----------+--------+ DP       109               1.01 multiphasic         +---------+------------------+-----+-----------+--------+ Great Toe70                0.65                     +---------+------------------+-----+-----------+--------+ +---------+------------------+-----+-----------+-------+ Left     Lt Pressure (mmHg)IndexWaveform   Comment +---------+------------------+-----+-----------+-------+ Brachial 108                    triphasic          +---------+------------------+-----+-----------+-------+ PTA      112               1.04 multiphasic        +---------+------------------+-----+-----------+-------+  DP       241               2.23 multiphasic        +---------+------------------+-----+-----------+-------+ Great  Toe83                0.77                    +---------+------------------+-----+-----------+-------+ +-------+-----------+-----------+------------+------------+ ABI/TBIToday's ABIToday's TBIPrevious ABIPrevious TBI +-------+-----------+-----------+------------+------------+ Right  1.1        0.65                                +-------+-----------+-----------+------------+------------+ Left            0.77                                +-------+-----------+-----------+------------+------------+  Summary: Right: Resting right ankle-brachial index is within normal range. The right toe-brachial index is abnormal. Left: Resting left ankle-brachial index indicates noncompressible left lower extremity arteries. The left toe-brachial index is normal. *See table(s) above for measurements and observations.  Electronically signed by Lemar Livings MD on 05/05/2023 at 1:38:24 PM.    Final    ECHOCARDIOGRAM COMPLETE Result Date: 05/05/2023    ECHOCARDIOGRAM REPORT   Patient Name:   Evan [name] Date of Exam: 05/05/2023 Medical Rec #:  [id number]      Height:       77.0 in Accession #:    [id number]     Weight:       260.0 lb Date of Birth:  1965/09/22      BSA:          2.501 m Patient Age:    57 years       BP:           106/79 mmHg Patient Gender: M              HR:           100 bpm. Exam Location:  Inpatient Procedure: 2D Echo, Cardiac Doppler, Color Doppler and Intracardiac            Opacification Agent Indications:    CHF  History:        Patient has prior history of Echocardiogram examinations, most                 recent 08/23/2020. Cardiomyopathy, Previous Myocardial Infarction,                 Arrythmias:Atrial Fibrillation; Risk Factors:Diabetes,                 Hypertension and Sleep Apnea.  Sonographer:    Darlys Gales Referring Phys: [id number] HAZEL V DUNCAN IMPRESSIONS  1. Left ventricular ejection fraction, by estimation, is 20%. The left ventricle has severely decreased function. The  left ventricle demonstrates global hypokinesis. The left ventricular internal cavity size was mildly dilated. Left ventricular diastolic parameters are consistent with Grade III diastolic dysfunction (restrictive). There was a thrombus at the LV apex.  2. Right ventricular systolic function is moderately reduced. The right ventricular size is mildly enlarged. There is moderately elevated pulmonary artery systolic pressure. The estimated right ventricular systolic pressure is 59.6 mmHg.  3. Left atrial size was moderately dilated.  4. Right atrial size was mildly dilated.  5. The mitral valve is abnormal. Moderate mitral valve  regurgitation. No evidence of mitral stenosis.  6. The aortic valve is tricuspid. There is mild calcification of the aortic valve. Aortic valve regurgitation is not visualized. No aortic stenosis is present.  7. The inferior vena cava is dilated in size with <50% respiratory variability, suggesting right atrial pressure of 15 mmHg. FINDINGS  Left Ventricle: Left ventricular ejection fraction, by estimation, is 20%. The left ventricle has severely decreased function. The left ventricle demonstrates global hypokinesis. The left ventricular internal cavity size was mildly dilated. There is no left ventricular hypertrophy. Left ventricular diastolic parameters are consistent with Grade III diastolic dysfunction (restrictive). Right Ventricle: The right ventricular size is mildly enlarged. No increase in right ventricular wall thickness. Right ventricular systolic function is moderately reduced. There is moderately elevated pulmonary artery systolic pressure. The tricuspid regurgitant velocity is 3.34 m/s, and with an assumed right atrial pressure of 15 mmHg, the estimated right ventricular systolic pressure is 59.6 mmHg. Left Atrium: Left atrial size was moderately dilated. Right Atrium: Right atrial size was mildly dilated. Pericardium: Trivial pericardial effusion is present. Mitral Valve: The  mitral valve is abnormal. Mild mitral annular calcification. Moderate mitral valve regurgitation. No evidence of mitral valve stenosis. Tricuspid Valve: The tricuspid valve is normal in structure. Tricuspid valve regurgitation is mild. Aortic Valve: The aortic valve is tricuspid. There is mild calcification of the aortic valve. Aortic valve regurgitation is not visualized. No aortic stenosis is present. Aortic valve mean gradient measures 3.0 mmHg. Aortic valve peak gradient measures 4.5 mmHg. Aortic valve area, by VTI measures 2.32 cm. Pulmonic Valve: The pulmonic valve was normal in structure. Pulmonic valve regurgitation is not visualized. Aorta: The aortic root is normal in size and structure. Venous: The inferior vena cava is dilated in size with less than 50% respiratory variability, suggesting right atrial pressure of 15 mmHg. IAS/Shunts: No atrial level shunt detected by color flow Doppler.  LEFT VENTRICLE PLAX 2D LVIDd:         6.20 cm LVIDs:         5.80 cm LV PW:         0.90 cm LV IVS:        0.60 cm LVOT diam:     2.10 cm LV SV:         33 LV SV Index:   13 LVOT Area:     3.46 cm  RIGHT VENTRICLE RV S prime:     10.30 cm/s TAPSE (M-mode): 0.8 cm LEFT ATRIUM              Index        RIGHT ATRIUM           Index LA Vol (A2C):   155.0 ml 61.98 ml/m  RA Area:     22.10 cm LA Vol (A4C):   117.0 ml 46.79 ml/m  RA Volume:   71.30 ml  28.51 ml/m LA Biplane Vol: 135.0 ml 53.99 ml/m  AORTIC VALVE AV Area (Vmax):    2.05 cm AV Area (Vmean):   2.03 cm AV Area (VTI):     2.32 cm AV Vmax:           106.00 cm/s AV Vmean:          75.700 cm/s AV VTI:            0.143 m AV Peak Grad:      4.5 mmHg AV Mean Grad:      3.0 mmHg LVOT Vmax:  62.70 cm/s LVOT Vmean:        44.400 cm/s LVOT VTI:          0.096 m LVOT/AV VTI ratio: 0.67  AORTA Ao Root diam: 3.20 cm MITRAL VALVE                TRICUSPID VALVE MV Area (PHT): 6.17 cm     TR Peak grad:   44.6 mmHg MV Decel Time: 123 msec     TR Vmax:        334.00  cm/s MV E velocity: 110.00 cm/s                             SHUNTS                             Systemic VTI:  0.10 m                             Systemic Diam: 2.10 cm Dalton McleanMD Electronically signed by Wilfred Lacy Signature Date/Time: 05/05/2023/11:36:02 AM    Final    DG Tibia/Fibula Right Result Date: 05/04/2023 CLINICAL DATA:  Diabetic wounds.  Pain and redness. EXAM: RIGHT TIBIA AND FIBULA - 2 VIEW COMPARISON:  None Available. FINDINGS: There is no evidence of fracture or other focal bone lesions. No erosive change or periostitis. Degenerative change of the knee and ankle. Chondrocalcinosis of the knee. Areas of subcutaneous edema, no soft tissue gas or radiopaque foreign body. IMPRESSION: 1. Subcutaneous edema. No soft tissue gas or radiopaque foreign body. 2. No radiographic evidence of osteomyelitis. 3. Degenerative change of the knee and ankle. Electronically Signed   By: Narda Rutherford M.D.   On: 05/04/2023 21:03   DG Tibia/Fibula Left Result Date: 05/04/2023 CLINICAL DATA:  Bilateral lower extremity diabetic wounds EXAM: LEFT TIBIA AND FIBULA - 2 VIEW COMPARISON:  None Available. FINDINGS: Normal alignment. No acute fracture or dislocation. Degenerative changes are noted within the left knee and left ankle. No osseous erosions or abnormal periosteal reaction. Mild vascular calcification noted. IMPRESSION: 1. Degenerative changes. No radiographic evidence of osteomyelitis. Electronically Signed   By: Helyn Numbers M.D.   On: 05/04/2023 21:02      Assessment/Plan:  INTERVAL HISTORY: UDS + for cocaine, amphetamines and opiates that he claims the latter Arthrum drugs he was given here in the ER   Principal Problem:   Vasculitis (HCC) Active Problems:   Essential hypertension   CAD S/P PCI   Diabetic foot ulcer (HCC)   History of acute cholecystitis 04/18/23 treated conservatively   Acute on chronic HFrEF (heart failure with reduced ejection fraction) (HCC)   Cellulitis  in diabetic foot (HCC)   CHF (congestive heart failure) (HCC)   Bilateral leg ulcer (HCC)   Acute on chronic combined systolic and diastolic CHF (congestive heart failure) (HCC)    Evan [name] is a 57 y.o. male with bilateral necrotic vasculitic appearing lesions scattered across both legs that have been present for several months. He also has been found to have a left ventricular thrombus.  #1 vasculitic skin lesions:  I obtained 2 areas for punch biopsy 1 sent for conventional path and 1 for special stains.  Top of my differential would be a a cocaine and (levamisole) induced vasculitis  Certainly methamphetamine would not help and particular could be contributing to his symptoms  of potential delusional parasitosis.  Ultimately he be best served by transfer to a tertiary care center with dermatology and rest rheumatology expertise.  In the interim I would be supportive of giving him systemic corticosteroids.  Is imperative that he stops using cocaine.  2.  Cardiomyopathy with coronary artery disease status post stents dilated cardiomyopathy and left ventricular thrombus: Being seen by cardiology:  Discussed case with cardiology PA Yvonna Alanis and they feel thrombus more likely due to his cardiomyopathy, potentially an MI  IN order to save IVF I would favor DC  his heparin and put him on DOAC   I have personally spent 54 minutes involved in face-to-face and non-face-to-face activities for this patient on the day of the visit. Professional time spent includes the following activities: Preparing to see the patient (review of tests), Obtaining and/or reviewing separately obtained history (admission/discharge record), Performing a medically appropriate examination and/or evaluation , Ordering medications/tests/procedures, referring and communicating with other health care professionals, Documenting clinical information in the EMR, Independently interpreting results (not separately  reported), Communicating results to the patient/family/caregiver, Counseling and educating the patient/family/caregiver and Care coordination (not separately reported).   I will sign off for now please call with further questions.   LOS: 1 day   Acey Lav 05/06/2023, 12:17 PM

## 2023-05-06 NOTE — Plan of Care (Signed)
  Problem: Coping: Goal: Ability to adjust to condition or change in health will improve Outcome: Progressing   Problem: Fluid Volume: Goal: Ability to maintain a balanced intake and output will improve Outcome: Progressing   Problem: Metabolic: Goal: Ability to maintain appropriate glucose levels will improve Outcome: Progressing   Problem: Skin Integrity: Goal: Risk for impaired skin integrity will decrease Outcome: Progressing   Problem: Tissue Perfusion: Goal: Adequacy of tissue perfusion will improve Outcome: Progressing

## 2023-05-06 NOTE — Consult Note (Addendum)
[facility]  [phone] [phone]    HEMATOLOGY CONSULTATION  PURPOSE OF CONSULTATION/CHIEF COMPLAINT:  Referring MD:  Dr. Nolberto Hanlon   HPI: This is a 57 year old male patient who was sent to the ED by his PCP due to concerns for lower extremity wound infections.  Patient gives history of lower extremity edema times approximately 4-6 weeks with dyspnea on exertion.  Reports wounds have become increasingly red and painful. Workup was done including labs and echocardiogram which showed an incidental finding of left ventricular thrombus.  Hematology is now, consulted. Patient assessed and examined today sitting up in bed eating, daughter at bedside.  Admits to dyspnea on and off for 5 years since heart attack.  Patient notes that he had pneumonia earlier in the year which he thought contributed to his difficulty breathing.  Denies dizziness, nausea, vomiting, diarrhea, loss of appetite, and denies GI bleeding.  Past medical history significant for hypertension, hyperlipidemia, diabetes, CAD, ischemic cardiomyopathy, and CHF.   Patient had recent hospitalization 04/18/2023 to 04/21/2023 for acute cholecystitis. Surgical history is noncontributory. Family history is significant for his father with clots in his legs and maternal grandmother with clots.  Patient states his father and his paternal grandfather both died in their 59s of heart attacks. Social history significant for polysubstance use including cocaine, admits to alcohol 1-2 drinks per day, denies tobacco use.  Reports that [place] and [place] many times in the woods around it.  Works with his Firefighter, does Event organiser.    ASSESSMENT AND PLAN:  LV Thrombus -Echocardiogram done 12/17 which had incidental finding of thrombus at LV apex.   -Of note, patient had CT angio chest done 04/18/23 which showed no significant PE at that time.   -on IV heparin, continue per protocol.    -Recommend switch to Eliquis upon discharge.  Patient is agreeable informed and aware that he may need minimum several months anticoagulation upon discharge. -Monitor for signs of active bleeding -Cardiology following, recommend close Cardio follow up upon discharge.   2. Diabetes -Monitor blood glucose levels -Insulin and diabetic meds per orders -Medicine following  3. Hypertension/Hyperlipidemia -Monitor BP level -Administer antihypertensives as ordered -Medicine following  4. Bil LE Edema/ LE Wounds -Multiple bilateral lower extremity wounds, some erythematous and some are necrotic in the middle -Wound care -Patient is afebrile. -Skin biopsy done today 05/06/2023.  Path pending.   -Noted plan to transfer patient to [facility] or [facility] for further dermatology and rheumatology workup. -ID following   Past Medical History:  Diagnosis Date   Arthritis    Atrial fibrillation (HCC)    CHF (congestive heart failure) (HCC)    Diabetes mellitus without complication (HCC)    Gout    Heart attack (HCC) 10/2018   Hyperlipidemia    Hypertension    Morbid obesity (HCC) 12/22/2016   Sleep apnea    had test twice unable to complete  :  Past Surgical History:  Procedure Laterality Date   ANKLE ARTHROSCOPY Right    COLONOSCOPY WITH PROPOFOL N/A 09/18/2020   Procedure: COLONOSCOPY WITH PROPOFOL;  Surgeon: Sherrilyn Rist, MD;  Location: [facility]OS[facility];  Service: Gastroenterology;  Laterality: N/A;  09-18-2020   CORONARY STENT INTERVENTION N/A 11/24/2018   Procedure: CORONARY STENT INTERVENTION;  Surgeon: Iran Ouch, MD;  Location: [facility] INVASIVE CV LAB;  Service: Cardiovascular;  Laterality: N/A;  prox and mid LAD   CORONARY STENT INTERVENTION N/A 11/26/2018   Procedure:  CORONARY STENT INTERVENTION;  Surgeon: Iran Ouch, MD;  Location: [facility] INVAS[facility] CV LAB;  Service: Cardiovascular;  Laterality: N/A;   CYST REMOVAL HAND     KNEE ARTHROSCOPY Left    LEFT HEART CATH AND CORONARY  ANGIOGRAPHY N/A 11/24/2018   Procedure: LEFT HEART CATH AND CORONARY ANGIOGRAPHY;  Surgeon: Iran Ouch, MD;  Location: [facility]VAS[facility] CV [facility];  Service: Cardiovascular;  Laterality: N/A;   POLYPECTOMY  09/18/2020   Procedure: POLYPECTOMY;  Surgeon: Sherrilyn Rist, MD;  Location: [facility];  Service: Gastroenterology;;  :  No Known Allergies:   Family History  Problem Relation Age of Onset   Diabetes Mother    Stroke Mother    Early death Father    Heart attack Father        died from heart attack at the age of 50   Alcohol abuse Father    Heart disease Father    Breast cancer Sister    Hypertension Brother    Heart attack Maternal Grandfather        died from heart attack at the age of 50   Early death Paternal Grandfather    Heart attack Paternal Grandfather    Colon cancer Neg Hx    Esophageal cancer Neg Hx    Rectal cancer Neg Hx    Stomach cancer Neg Hx   :   Social History   Socioeconomic History   Marital status: Single    Spouse name: Not on file   Number of children: Not on file   Years of education: Not on file   Highest education level: Not on file  Occupational History   Not on file  Tobacco Use   Smoking status: Never   Smokeless tobacco: Never  Vaping Use   Vaping status: Never Used  Substance and Sexual Activity   Alcohol use: Yes    Alcohol/week: 12.0 standard drinks of alcohol    Types: 12 Cans of beer per week    Comment: 5 beers a week   Drug use: No   Sexual activity: Yes    Comment: male 8 months partner  Other Topics Concern   Not on file  Social History Narrative   Not on file   Social Drivers of Health   Financial Resource Strain: Medium Risk (10/07/2022)   Received from [facility], [facility]   Overall Financial Resource Strain (CARDIA)    Difficulty of Paying Living Expenses: Somewhat hard  Food Insecurity: No Food Insecurity (04/19/2023)   Hunger Vital Sign    Worried About Running Out of Food in the Last  Year: Never true    Ran Out of Food in the Last Year: Never true  Transportation Needs: No Transportation Needs (04/19/2023)   PRAPARE - Administrator, Civil Service (Medical): No    Lack of Transportation (Non-Medical): No  Physical Activity: Not on file  Stress: Not on file  Social Connections: Not on file  Intimate Partner Violence: Not At Risk (04/19/2023)   Humiliation, Afraid, Rape, and Kick questionnaire    Fear of Current or Ex-Partner: No    Emotionally Abused: No    Physically Abused: No    Sexually Abused: No  :   CURRENT MEDS: Current Facility-Administered Medications  Medication Dose Route Frequency Provider Last Rate Last Admin   acetaminophen (TYLENOL) tablet 650 mg  650 mg Oral Q6H PRN Andris Baumann, MD       Or  acetaminophen (TYLENOL) suppository 650 mg  650 mg Rectal Q6H PRN Andris Baumann, MD       aspirin EC tablet 81 mg  81 mg Oral Q breakfast Andris Baumann, MD   81 mg at 05/06/23 0907   atorvastatin (LIPITOR) tablet 80 mg  80 mg Oral Daily Andris Baumann, MD   80 mg at 05/06/23 0906   empagliflozin (JARDIANCE) tablet 25 mg  25 mg Oral Daily Andris Baumann, MD   25 mg at 05/06/23 1104   famotidine (PEPCID) tablet 20 mg  20 mg Oral BID Andris Baumann, MD   20 mg at 05/06/23 1610   fenofibrate tablet 54 mg  54 mg Oral Daily Andris Baumann, MD   54 mg at 05/06/23 1104   furosemide (LASIX) injection 40 mg  40 mg Intravenous Q12H Andris Baumann, MD   40 mg at 05/06/23 0515   heparin ADULT infusion 100 units/mL (25000 units/268mL)  2,000 Units/hr Intravenous Continuous Danford Bad, RPH 20 mL/hr at 05/06/23 0724 2,000 Units/hr at 05/06/23 0724   HYDROcodone-acetaminophen (NORCO/VICODIN) 5-325 MG per tablet 1-2 tablet  1-2 tablet Oral Q4H PRN Andris Baumann, MD   2 tablet at 05/05/23 2019   insulin aspart (novoLOG) injection 0-15 Units  0-15 Units Subcutaneous Q4H Andris Baumann, MD   3 Units at 05/06/23 1253   morphine (PF) 2 MG/ML injection  2 mg  2 mg Intravenous Q2H PRN Andris Baumann, MD       mupirocin cream (BACTROBAN) 2 %   Topical BID Nolberto Hanlon, MD       ondansetron [facility]) tablet 4 mg  4 mg Oral Q6H PRN Andris Baumann, MD       Or   ondansetron [facility]) injection 4 mg  4 mg Intravenous Q6H PRN Andris Baumann, MD       potassium chloride (KLOR-CON) packet 40 mEq  40 mEq Oral Once Nolberto Hanlon, MD       spironolactone (ALDACTONE) tablet 12.5 mg  12.5 mg Oral Daily Andris Baumann, MD   12.5 mg at 05/06/23 1104    REVIEW OF SYSTEMS:   Constitutional: Denies fevers, chills or abnormal night sweats Eyes: Denies blurriness of vision, double vision or watery eyes Ears, nose, mouth, throat, and face: Denies mucositis or sore throat Respiratory: +dyspnea on exertion Cardiovascular: Denies palpitation, chest discomfort or lower extremity swelling Gastrointestinal: Denies nausea, heartburn or change in bowel habits Skin: +bil lower extremity lesions  Lymphatics: Denies new lymphadenopathy or easy bruising Neurological: Denies numbness, tingling or new weaknesses Behavioral/Psych: Mood is stable, no new changes  All other systems were reviewed with the patient and are negative.  PHYSICAL EXAMINATION: ECOG PERFORMANCE STATUS: 1 - Symptomatic but completely ambulatory  Vitals:   05/06/23 1247 05/06/23 1254  BP:  (!) 114/102  Pulse:  (!) 102  Resp:  16  Temp: 98.7 F (37.1 C) 98.7 F (37.1 C)  SpO2:  93%   There were no vitals filed for this visit.  GENERAL: alert, no distress and comfortable SKIN: skin color, texture, turgor are normal, no rashes or significant lesions EYES: normal, conjunctiva are pink and non-injected, sclera clear OROPHARYNX: no exudate, no erythema and lips, buccal mucosa, and tongue normal  NECK: supple, thyroid normal size, non-tender, without nodularity LYMPH: no palpable lymphadenopathy in the cervical, axillary or inguinal LUNGS: clear to auscultation and percussion with normal  breathing effort HEART: regular rate & rhythm and no murmurs  and no lower extremity edema Skin: +bil lower extremity lesions  ABDOMEN: abdomen soft, non-tender and normal bowel sounds MUSCULOSKELETAL: no cyanosis of digits and no clubbing  PSYCH: alert & oriented x 3 with fluent speech NEURO: no focal motor/sensory deficits   LABS: Lab Results  Component Value Date   WBC 8.6 05/06/2023   HGB 14.7 05/06/2023   HCT 44.9 05/06/2023   MCV 87.4 05/06/2023   PLT 270 05/06/2023    Lab Results  Component Value Date   WBC 8.6 05/06/2023   HGB 14.7 05/06/2023   HCT 44.9 05/06/2023   PLT 270 05/06/2023   GLUCOSE 135 (H) 05/06/2023   CHOL 119 02/11/2023   TRIG 77 02/11/2023   HDL 33 (L) 02/11/2023   LDLCALC 70 02/11/2023   ALT 28 05/04/2023   AST 33 05/04/2023   NA 134 (L) 05/06/2023   K 3.0 (L) 05/06/2023   CL 101 05/06/2023   CREATININE 1.16 05/06/2023   BUN 24 (H) 05/06/2023   CO2 25 05/06/2023   INR 1.3 (H) 04/20/2023   HGBA1C 7.3 (H) 04/19/2023    DG Chest Port 1 View Result Date: 05/06/2023 CLINICAL DATA:  Congestive heart failure. EXAM: PORTABLE CHEST 1 VIEW COMPARISON:  04/18/2023 FINDINGS: Unchanged cardiac enlargement. No signs of pleural effusion, interstitial edema or airspace disease. Visualized osseous structures are unremarkable. IMPRESSION: Cardiomegaly. No acute findings. Electronically Signed   By: Signa Kell M.D.   On: 05/06/2023 06:10   VAS [place] LOWER EXTREMITY VENOUS (DVT) Result Date: 05/05/2023  Lower Venous DVT Study Patient Name:  GREOGORY SLABY  Date of Exam:   05/05/2023 Medical Rec #: [id number]       Accession #:    [id number] Date of Birth: [birth year]       Patient Gender: M Patient Age:   35 years Exam Location:  [facility] Procedure:      VAS [place] LOWER EXTREMITY VENOUS (DVT) Referring Phys: Alwyn Ren GONFA --------------------------------------------------------------------------------  Indications: Elevated Ddimer.  Risk Factors: None  identified. Limitations: Open wound. Comparison Study: No prior studies. Performing Technologist: Chanda Busing RVT  Examination Guidelines: A complete evaluation includes B-mode imaging, spectral Doppler, color Doppler, and power Doppler as needed of all accessible portions of each vessel. Bilateral testing is considered an integral part of a complete examination. Limited examinations for reoccurring indications may be performed as noted. The reflux portion of the exam is performed with the patient in reverse Trendelenburg.  +---------+---------------+---------+-----------+----------+--------------+ RIGHT    CompressibilityPhasicitySpontaneityPropertiesThrombus Aging +---------+---------------+---------+-----------+----------+--------------+ CFV      Full           Yes      Yes                                 +---------+---------------+---------+-----------+----------+--------------+ SFJ      Full                                                        +---------+---------------+---------+-----------+----------+--------------+ FV Prox  Full                                                        +---------+---------------+---------+-----------+----------+--------------+  FV Mid   Full                                                        +---------+---------------+---------+-----------+----------+--------------+ FV DistalFull                                                        +---------+---------------+---------+-----------+----------+--------------+ PFV      Full                                                        +---------+---------------+---------+-----------+----------+--------------+ POP      Full           Yes      Yes                                 +---------+---------------+---------+-----------+----------+--------------+ PTV      Full                                                         +---------+---------------+---------+-----------+----------+--------------+ PERO     Full                                                        +---------+---------------+---------+-----------+----------+--------------+   +---------+---------------+---------+-----------+----------+--------------+ LEFT     CompressibilityPhasicitySpontaneityPropertiesThrombus Aging +---------+---------------+---------+-----------+----------+--------------+ CFV      Full           Yes      Yes                                 +---------+---------------+---------+-----------+----------+--------------+ SFJ      Full                                                        +---------+---------------+---------+-----------+----------+--------------+ FV Prox  Full                                                        +---------+---------------+---------+-----------+----------+--------------+ FV Mid   Full                                                        +---------+---------------+---------+-----------+----------+--------------+  FV DistalFull                                                        +---------+---------------+---------+-----------+----------+--------------+ PFV      Full                                                        +---------+---------------+---------+-----------+----------+--------------+ POP      Full           Yes      Yes                                 +---------+---------------+---------+-----------+----------+--------------+ PTV      Full                                                        +---------+---------------+---------+-----------+----------+--------------+ PERO     Full                                                        +---------+---------------+---------+-----------+----------+--------------+     Summary: RIGHT: - There is no evidence of deep vein thrombosis in the lower extremity. However, portions of this  examination were limited- see technologist comments above.  - No cystic structure found in the popliteal fossa.  LEFT: - There is no evidence of deep vein thrombosis in the lower extremity. However, portions of this examination were limited- see technologist comments above.  - No cystic structure found in the popliteal fossa.  *See table(s) above for measurements and observations. Electronically signed by Lemar Livings MD on 05/05/2023 at 1:38:32 PM.    Final    VAS [place] ABI WITH/WO TBI Result Date: 05/05/2023  LOWER EXTREMITY DOPPLER STUDY Patient Name:  THEDORE JEANMARIE  Date of Exam:   05/05/2023 Medical Rec #: [id number]       Accession #:    [id number] Date of Birth: 1965/11/02       Patient Gender: M Patient Age:   87 years Exam Location:  [facility] Procedure:      VAS [place] ABI WITH/WO TBI Referring Phys: ELIZABETH REES --------------------------------------------------------------------------------  Indications: Rest pain, and ulceration. High Risk Factors: Hypertension, hyperlipidemia, Diabetes.  Limitations: Today's exam was limited due to an open wound and involuntary              patient movement. Comparison Study: No prior studies. Performing Technologist: Olen Cordial RVT  Examination Guidelines: A complete evaluation includes at minimum, Doppler waveform signals and systolic blood pressure reading at the level of bilateral brachial, anterior tibial, and posterior tibial arteries, when vessel segments are accessible. Bilateral testing is considered an integral part of a complete examination. Photoelectric Plethysmograph (PPG) waveforms and toe systolic pressure readings are included as required and additional duplex  testing as needed. Limited examinations for reoccurring indications may be performed as noted.  ABI Findings: +---------+------------------+-----+-----------+--------+ Right    Rt Pressure (mmHg)IndexWaveform   Comment  +---------+------------------+-----+-----------+--------+  Brachial 95                     triphasic           +---------+------------------+-----+-----------+--------+ PTA      119               1.10 multiphasic         +---------+------------------+-----+-----------+--------+ DP       109               1.01 multiphasic         +---------+------------------+-----+-----------+--------+ Great Toe70                0.65                     +---------+------------------+-----+-----------+--------+ +---------+------------------+-----+-----------+-------+ Left     Lt Pressure (mmHg)IndexWaveform   Comment +---------+------------------+-----+-----------+-------+ Brachial 108                    triphasic          +---------+------------------+-----+-----------+-------+ PTA      112               1.04 multiphasic        +---------+------------------+-----+-----------+-------+ DP       241               2.23 multiphasic        +---------+------------------+-----+-----------+-------+ Great Toe83                0.77                    +---------+------------------+-----+-----------+-------+ +-------+-----------+-----------+------------+------------+ ABI/TBIToday's ABIToday's TBIPrevious ABIPrevious TBI +-------+-----------+-----------+------------+------------+ Right  1.1        0.65                                +-------+-----------+-----------+------------+------------+ Left   [place]         0.77                                +-------+-----------+-----------+------------+------------+  Summary: Right: Resting right ankle-brachial index is within normal range. The right toe-brachial index is abnormal. Left: Resting left ankle-brachial index indicates noncompressible left lower extremity arteries. The left toe-brachial index is normal. *See table(s) above for measurements and observations.  Electronically signed by Lemar Livings MD on 05/05/2023 at 1:38:24 PM.    Final    ECHOCARDIOGRAM COMPLETE Result Date:  05/05/2023    ECHOCARDIOGRAM REPORT   Patient Name:   DERRIEN GADZINSKI Date of Exam: 05/05/2023 Medical Rec #:  [id number]      Height:       77.0 in Accession #:    [id number]     Weight:       260.0 lb Date of Birth:  1965-10-05      BSA:          2.501 m Patient Age:    57 years       BP:           106/79 mmHg Patient Gender: M              HR:  100 bpm. Exam Location:  Inpatient Procedure: 2D Echo, Cardiac Doppler, Color Doppler and Intracardiac            Opacification Agent Indications:    CHF  History:        Patient has prior history of Echocardiogram examinations, most                 recent 08/23/2020. Cardiomyopathy, Previous Myocardial Infarction,                 Arrythmias:Atrial Fibrillation; Risk Factors:Diabetes,                 Hypertension and Sleep Apnea.  Sonographer:    Darlys Gales Referring Phys: [id number] HAZEL V DUNCAN IMPRESSIONS  1. Left ventricular ejection fraction, by estimation, is 20%. The left ventricle has severely decreased function. The left ventricle demonstrates global hypokinesis. The left ventricular internal cavity size was mildly dilated. Left ventricular diastolic parameters are consistent with Grade III diastolic dysfunction (restrictive). There was a thrombus at the LV apex.  2. Right ventricular systolic function is moderately reduced. The right ventricular size is mildly enlarged. There is moderately elevated pulmonary artery systolic pressure. The estimated right ventricular systolic pressure is 59.6 mmHg.  3. Left atrial size was moderately dilated.  4. Right atrial size was mildly dilated.  5. The mitral valve is abnormal. Moderate mitral valve regurgitation. No evidence of mitral stenosis.  6. The aortic valve is tricuspid. There is mild calcification of the aortic valve. Aortic valve regurgitation is not visualized. No aortic stenosis is present.  7. The inferior vena cava is dilated in size with <50% respiratory variability, suggesting right atrial pressure of  15 mmHg. FINDINGS  Left Ventricle: Left ventricular ejection fraction, by estimation, is 20%. The left ventricle has severely decreased function. The left ventricle demonstrates global hypokinesis. The left ventricular internal cavity size was mildly dilated. There is no left ventricular hypertrophy. Left ventricular diastolic parameters are consistent with Grade III diastolic dysfunction (restrictive). Right Ventricle: The right ventricular size is mildly enlarged. No increase in right ventricular wall thickness. Right ventricular systolic function is moderately reduced. There is moderately elevated pulmonary artery systolic pressure. The tricuspid regurgitant velocity is 3.34 m/s, and with an assumed right atrial pressure of 15 mmHg, the estimated right ventricular systolic pressure is 59.6 mmHg. Left Atrium: Left atrial size was moderately dilated. Right Atrium: Right atrial size was mildly dilated. Pericardium: Trivial pericardial effusion is present. Mitral Valve: The mitral valve is abnormal. Mild mitral annular calcification. Moderate mitral valve regurgitation. No evidence of mitral valve stenosis. Tricuspid Valve: The tricuspid valve is normal in structure. Tricuspid valve regurgitation is mild. Aortic Valve: The aortic valve is tricuspid. There is mild calcification of the aortic valve. Aortic valve regurgitation is not visualized. No aortic stenosis is present. Aortic valve mean gradient measures 3.0 mmHg. Aortic valve peak gradient measures 4.5 mmHg. Aortic valve area, by VTI measures 2.32 cm. Pulmonic Valve: The pulmonic valve was normal in structure. Pulmonic valve regurgitation is not visualized. Aorta: The aortic root is normal in size and structure. Venous: The inferior vena cava is dilated in size with less than 50% respiratory variability, suggesting right atrial pressure of 15 mmHg. IAS/Shunts: No atrial level shunt detected by color flow Doppler.  LEFT VENTRICLE PLAX 2D LVIDd:         6.20 cm  LVIDs:         5.80 cm LV PW:  0.90 cm LV IVS:        0.60 cm LVOT diam:     2.10 cm LV SV:         33 LV SV Index:   13 LVOT Area:     3.46 cm  RIGHT VENTRICLE RV S prime:     10.30 cm/s TAPSE (M-mode): 0.8 cm LEFT ATRIUM              Index        RIGHT ATRIUM           Index LA Vol (A2C):   155.0 ml 61.98 ml/m  RA Area:     22.10 cm LA Vol (A4C):   117.0 ml 46.79 ml/m  RA Volume:   71.30 ml  28.51 ml/m LA Biplane Vol: 135.0 ml 53.99 ml/m  AORTIC VALVE AV Area (Vmax):    2.05 cm AV Area (Vmean):   2.03 cm AV Area (VTI):     2.32 cm AV Vmax:           106.00 cm/s AV Vmean:          75.700 cm/s AV VTI:            0.143 m AV Peak Grad:      4.5 mmHg AV Mean Grad:      3.0 mmHg LVOT Vmax:         62.70 cm/s LVOT Vmean:        44.400 cm/s LVOT VTI:          0.096 m LVOT/AV VTI ratio: 0.67  AORTA Ao Root diam: 3.20 cm MITRAL VALVE                TRICUSPID VALVE MV Area (PHT): 6.17 cm     TR Peak grad:   44.6 mmHg MV Decel Time: 123 msec     TR Vmax:        334.00 cm/s MV E velocity: 110.00 cm/s                             SHUNTS                             Systemic VTI:  0.10 m                             Systemic Diam: 2.10 cm Dalton McleanMD Electronically signed by Wilfred Lacy Signature Date/Time: 05/05/2023/11:36:02 AM    Final    DG Tibia/Fibula Right Result Date: 05/04/2023 CLINICAL DATA:  Diabetic wounds.  Pain and redness. EXAM: RIGHT TIBIA AND FIBULA - 2 VIEW COMPARISON:  None Available. FINDINGS: There is no evidence of fracture or other focal bone lesions. No erosive change or periostitis. Degenerative change of the knee and ankle. Chondrocalcinosis of the knee. Areas of subcutaneous edema, no soft tissue gas or radiopaque foreign body. IMPRESSION: 1. Subcutaneous edema. No soft tissue gas or radiopaque foreign body. 2. No radiographic evidence of osteomyelitis. 3. Degenerative change of the knee and ankle. Electronically Signed   By: Narda Rutherford M.D.   On: 05/04/2023 21:03   DG  Tibia/Fibula Left Result Date: 05/04/2023 CLINICAL DATA:  Bilateral lower extremity diabetic wounds EXAM: LEFT TIBIA AND FIBULA - 2 VIEW COMPARISON:  None Available. FINDINGS: Normal alignment. No acute fracture or dislocation. Degenerative changes are noted within the  left knee and left ankle. No osseous erosions or abnormal periosteal reaction. Mild vascular calcification noted. IMPRESSION: 1. Degenerative changes. No radiographic evidence of osteomyelitis. Electronically Signed   By: Helyn Numbers M.D.   On: 05/04/2023 21:02   US Abdomen Limited Result Date: 04/19/2023 CLINICAL DATA:  [id number] Abdominal pain [id number] EXAM: ULTRASOUND ABDOMEN LIMITED RIGHT UPPER QUADRANT COMPARISON:  CT 04/18/2023 FINDINGS: Gallbladder: Gallbladder wall abnormally thickened measuring 10 mm. Adjacent pericholecystic fluid. Small amount of layering sludge. No shadowing stones. No sonographic Murphy sign noted by sonographer. Common bile duct: Diameter: 3 mm. Liver: No focal lesion identified. Within normal limits in parenchymal echogenicity. Portal vein is patent on color Doppler imaging with normal direction of blood flow towards the liver. Other: None. IMPRESSION: Abnormal gallbladder wall thickening with pericholecystic fluid and sludge. No shadowing stones. Findings are equivocal for acute cholecystitis. If further evaluation is needed, consider nuclear medicine HIDA scan. Electronically Signed   By: Duanne Guess D.O.   On: 04/19/2023 10:53   CT Angio Chest PE W and/or Wo Contrast Result Date: 04/18/2023 CLINICAL DATA:  Pulmonary embolus suspected with low to intermediate probability. Positive D-dimer. Near syncope, dizziness, and shortness of breath. Nonproductive cough. Acute nonlocalized abdominal pain. EXAM: CT ANGIOGRAPHY CHEST CT ABDOMEN AND PELVIS WITH CONTRAST TECHNIQUE: Multidetector CT imaging of the chest was performed using the standard protocol during bolus administration of intravenous contrast.  Multiplanar CT image reconstructions and MIPs were obtained to evaluate the vascular anatomy. Multidetector CT imaging of the abdomen and pelvis was performed using the standard protocol during bolus administration of intravenous contrast. RADIATION DOSE REDUCTION: This exam was performed according to the departmental dose-optimization program which includes automated exposure control, adjustment of the mA and/or kV according to patient size and/or use of iterative reconstruction technique. CONTRAST:  OMNIPAQUE IOHEXOL 350 MG/ML SOLN COMPARISON:  CT chest 03/23/2023. FINDINGS: CTA CHEST FINDINGS Cardiovascular: Good opacification of the central and segmental pulmonary arteries. No focal filling defects. No evidence of significant pulmonary embolus. Normal caliber thoracic aorta. Calcification in the coronary arteries. Coronary stents. Mild calcification in the thoracic aorta. Mediastinum/Nodes: No enlarged mediastinal, hilar, or axillary lymph nodes. Thyroid gland, trachea, and esophagus demonstrate no significant findings. Lungs/Pleura: Mild interstitial changes to the lungs may indicate chronic fibrosis or acute edema. No pleural effusions. No pneumothorax. No focal consolidation. Musculoskeletal: No chest wall abnormality. No acute or significant osseous findings. Review of the MIP images confirms the above findings. CT ABDOMEN and PELVIS FINDINGS Hepatobiliary: Gallbladder wall is thickened with pericholecystic edema and stranding. No stones are identified. Changes may represent acute cholecystitis, either acalculous or with non radiopaque stones. No bile duct dilatation. No focal liver lesions. Pancreas: Unremarkable. No pancreatic ductal dilatation or surrounding inflammatory changes. Spleen: Normal in size without focal abnormality. Adrenals/Urinary Tract: Adrenal glands are unremarkable. Kidneys are normal, without renal calculi, focal lesion, or hydronephrosis. Bladder is unremarkable. Stomach/Bowel:  Stomach is within normal limits. Appendix appears normal. No evidence of bowel wall thickening, distention, or inflammatory changes. Vascular/Lymphatic: Aortic atherosclerosis. No enlarged abdominal or pelvic lymph nodes. Reproductive: Prostate is unremarkable. Other: Small amount of free fluid around the liver, likely reactive. No free air. Abdominal wall musculature appears intact. Musculoskeletal: No acute or significant osseous findings. Review of the MIP images confirms the above findings. IMPRESSION: 1. No evidence of significant pulmonary embolus. 2. Mild interstitial changes in the lungs, possibly chronic fibrosis or acute edema. No focal consolidation. 3. Gallbladder wall thickening with pericholecystic edema, likely cholecystitis. No discrete  stones are identified. 4. Aortic atherosclerosis. 5. Small amount of free fluid around the liver is likely reactive. Electronically Signed   By: Burman Nieves M.D.   On: 04/18/2023 22:29   CT ABDOMEN PELVIS W CONTRAST Result Date: 04/18/2023 CLINICAL DATA:  Pulmonary embolus suspected with low to intermediate probability. Positive D-dimer. Near syncope, dizziness, and shortness of breath. Nonproductive cough. Acute nonlocalized abdominal pain. EXAM: CT ANGIOGRAPHY CHEST CT ABDOMEN AND PELVIS WITH CONTRAST TECHNIQUE: Multidetector CT imaging of the chest was performed using the standard protocol during bolus administration of intravenous contrast. Multiplanar CT image reconstructions and MIPs were obtained to evaluate the vascular anatomy. Multidetector CT imaging of the abdomen and pelvis was performed using the standard protocol during bolus administration of intravenous contrast. RADIATION DOSE REDUCTION: This exam was performed according to the departmental dose-optimization program which includes automated exposure control, adjustment of the mA and/or kV according to patient size and/or use of iterative reconstruction technique. CONTRAST:  OMNIPAQUE  IOHEXOL 350 MG/ML SOLN COMPARISON:  CT chest 03/23/2023. FINDINGS: CTA CHEST FINDINGS Cardiovascular: Good opacification of the central and segmental pulmonary arteries. No focal filling defects. No evidence of significant pulmonary embolus. Normal caliber thoracic aorta. Calcification in the coronary arteries. Coronary stents. Mild calcification in the thoracic aorta. Mediastinum/Nodes: No enlarged mediastinal, hilar, or axillary lymph nodes. Thyroid gland, trachea, and esophagus demonstrate no significant findings. Lungs/Pleura: Mild interstitial changes to the lungs may indicate chronic fibrosis or acute edema. No pleural effusions. No pneumothorax. No focal consolidation. Musculoskeletal: No chest wall abnormality. No acute or significant osseous findings. Review of the MIP images confirms the above findings. CT ABDOMEN and PELVIS FINDINGS Hepatobiliary: Gallbladder wall is thickened with pericholecystic edema and stranding. No stones are identified. Changes may represent acute cholecystitis, either acalculous or with non radiopaque stones. No bile duct dilatation. No focal liver lesions. Pancreas: Unremarkable. No pancreatic ductal dilatation or surrounding inflammatory changes. Spleen: Normal in size without focal abnormality. Adrenals/Urinary Tract: Adrenal glands are unremarkable. Kidneys are normal, without renal calculi, focal lesion, or hydronephrosis. Bladder is unremarkable. Stomach/Bowel: Stomach is within normal limits. Appendix appears normal. No evidence of bowel wall thickening, distention, or inflammatory changes. Vascular/Lymphatic: Aortic atherosclerosis. No enlarged abdominal or pelvic lymph nodes. Reproductive: Prostate is unremarkable. Other: Small amount of free fluid around the liver, likely reactive. No free air. Abdominal wall musculature appears intact. Musculoskeletal: No acute or significant osseous findings. Review of the MIP images confirms the above findings. IMPRESSION: 1. No  evidence of significant pulmonary embolus. 2. Mild interstitial changes in the lungs, possibly chronic fibrosis or acute edema. No focal consolidation. 3. Gallbladder wall thickening with pericholecystic edema, likely cholecystitis. No discrete stones are identified. 4. Aortic atherosclerosis. 5. Small amount of free fluid around the liver is likely reactive. Electronically Signed   By: Burman Nieves M.D.   On: 04/18/2023 22:29   DG Chest Portable 1 View Result Date: 04/18/2023 CLINICAL DATA:  Near-syncope.  Shortness of breath EXAM: PORTABLE CHEST 1 VIEW COMPARISON:  X-ray and CT angiogram 03/23/2023. FINDINGS: Stable enlarged cardiopericardial silhouette with some vascular congestion. No consolidation, pneumothorax or effusion. No edema. Overlapping cardiac leads. The right inferior costophrenic angle is clipped off the edge of the film. IMPRESSION: No significant interval change. Electronically Signed   By: Karen Kays M.D.   On: 04/18/2023 20:52     The total time spent in the appointment was 40 minutes encounter with patients including review of chart and various tests results, discussions about plan of  care and coordination of care plan   All questions were answered. The patient knows to call the clinic with any problems, questions or concerns. No barriers to learning was detected.  Thank you for the courtesy of this consultation, Dawson Bills, NP  12/18/20241:10 PM  ADDENDUM: Hematology/Oncology Attending: The patient is seen and examined.  I agree with the above note.  He is a 57 years old white male with multiple ulcers as well as infection of the lower extremities after helping a friend's father and removing some junk from his home.  The patient also has a history of coronary artery disease, congestive heart failure, ischemic cardiomyopathy the patient has been on several courses of antibiotics in the past but no improvement in his condition.  He was admitted to the hospital with  shortness of breath and for management of the infection of the lower extremities.  During his evaluation he had 2D echo performed yesterday and incidentally it showed left ventricular ejection fraction of around 20% with global hypokinesia of the left ventricle.  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  We were consulted regarding anticoagulation.  The patient is followed by Dr. Allyson Sabal with cardiology. He has family history of deep venous thrombosis. I strongly believe that the left ventricular thrombus is secondary to his cardiac condition and ischemic cardiomyopathy and dilated left ventricle.  I recommended for the patient to continue on heparin during his hospitalization and switching to Eliquis after discharge.  He will need follow-up visit and evaluation by his cardiologist but I do not see any need for hematology follow-up on outpatient at this point. Thank you for allowing me to participate in the care of Mr. Trana.  Please call if you have any questions.  Disclaimer: This note was dictated with voice recognition software. Similar sounding words can inadvertently be transcribed and may be missed upon review. Lajuana Matte, MD

## 2023-05-06 NOTE — Consult Note (Signed)
WOC Nurse Consult Note: Reason for Consult:LE ulcerations  Wound type: unclear etiology Full thickness necrotic lesions on the left and right lower legs  ABIs non compressible on the left and normal on the right LE.  Punch bx performed 12/18 per ID.  Agree with initial differential dx of drug abuse related lesions either levamisole or xylazine. Do not feel they are DM or vascular related. ID has recommended transfer to tertiary care center with dermatology expertise.  Pressure Injury POA: NA Measurement: see nursing flow sheets Wound UJW:[id number] are all necrotic  Drainage (amount, consistency, odor) none documented  Periwound: edema, patient with history of CHF Dressing procedure/placement/frequency: Cleanse wounds with Vashe Hart [place] [phone]), pat dry.  Apply single layer of xeroform, top with dry dressing, wrap with kerlix. Change daily  FU outpatient with wound care center of patient's choice for long term management.   Wounds WILL WORSEN if patient continues to have issues with polysubstance abuse   Re consult if needed, will not follow at this time. Thanks  Cydney Alvarenga [employer], RN,CWOCN, CNS, CWON-AP [phone])

## 2023-05-06 NOTE — Progress Notes (Signed)
Progress Note   Patient: Evan [name]:[id number] DOB: 1965-09-27 DOA: 05/04/2023     1 DOS: the patient was seen and examined on 05/06/2023   Brief hospital course: No notes on file 57 year old M with PMH of sCHF/ICM, CAD/PCI, DM-2, polysubstance use, recent acute cholecystitis for which she was treated with antibiotics after he refused HIDA scan and biliary drain sent to ED by PCP due to bilateral lower extremity "wound infection".  He reports increased redness, pain and weeping from his legs.  He also had dyspnea on exertion and BLE edema for about 2 weeks.  He was admitted with working diagnosis of diabetic wound infection and acute on chronic CHF.  BNP about 1000.  No fever or leukocytosis.  Bilateral leg x-ray with subcutaneous edema.  Cultures obtained.  Started on antibiotics.  TTE and ABI ordered.     TTE with LVEF of 20%, GH, G3 DD, LV thrombus, moderate MR and RVSP of 59.6 mmHg.  Started on IV heparin.  Cardiology and ID consulted. Assessment and Plan:  Bilateral lower extremity and hand ulceration with infection and diabetic patient: see photos in media section of chart. Ugoing on for 5-6 weeks per patient.  Some with purulent drainage.   History of cocaine use but patient denies substance use or IVDU other than occasional marijuana.  CRP 2.5.  ESR 1.0. s/p vancomyicin and cefepiem. ID has curently held of all abx since 05/05/2023 --ve  blood cultures - although right toe  ABI is abnormal. Lesion nto consistent with major vasulcar insuficiency. No DVT -Wound care consult - punch biopsy done by ID on 12/18 - await results. - acute hepatitis panel in wnl Pending anca, cardiolipin, beta-2-glycoprotein, ana, RF factor, cryoglobulins.    Acute on chronic HFrEF/BiV HF/LV thrombus: TTE with LVEF of 20%, GH, G3 DD, LV thrombus, moderate MR and RVSP of 59.6 mmHg.  TTE finding worse compared to his TTE in 2022.  Had DOE, significant edema.  BNP elevated to thousands.  Started on IV  Lasix. -Continue IV Lasix 40 mg twice daily, monitor weight and I/o - deferred metoprolol as UDS + ve for cocaine -Continue home Aldactone and Jardiance -Hold losartan for now --Start IV heparin for LV thrombus -Cardiology consulted, eval appreciated.   LV thrombus -incidentally discovered during echo for fludi overload IV heparin - I have requested consult from [facility] service Dr. Arbutus Ped.   NIDDM-2 with hyperglycemia: A1c 7.3% on 12/1. Last Labs       Recent Labs  Lab 05/05/23 0521 05/05/23 0754 05/05/23 1147  GLUCAP 200* 159* 114*    -Continue current insulin regimen   History of CAD/PCI: No anginal symptoms. -Continue aspirin and Lipitor   History of acute cholecystitis 04/18/23 treated conservatively with antibiotics.  No GI symptoms.   Essential hypertension: Normotensive but soft. -Hold losartan metoprolol   Elevated D-dimer: Mild.  Likely due to LV thrombus. -Check lower extremity venous Doppler -IV heparin for LV thrombus   Tachycardia - POA. sinus  Polysubstance use - including cocaine. However, patient denies iv drug use.     Subjective: patient tolerating diet, no vomiting, no diahrea, no rigors.  Physical Exam: Vitals:   05/06/23 0530 05/06/23 0600 05/06/23 0903 05/06/23 1000  BP:  109/84 94/81 113/76  Pulse:  (!) 51 (!) 107 (!) 105  Resp:  18 16 (!) 21  Temp: 97.6 F (36.4 C)  97.7 F (36.5 C)   TempSrc:   Oral   SpO2:  99% 98% 97%  Dental: Alert awake oriented x 3 no distress Respiratory exam: Bilateral intravesicular Cardiovascular exam S1-S2 normal Abdomen slight obesity apparent Soft Extremities 1+ edema.  Patient has ulcers on legs bilateral also on thighs, some are new similar more chronic appearing larger up to 2 cm x 2 cm across, see representative photo at the time of admission in the media section. Data Reviewed:  Labs on Admission:  Results for orders placed or performed during the hospital encounter of 05/04/23 (from the past  24 hours)  Rapid urine drug screen (hospital performed)     Status: Abnormal   Collection Time: 05/05/23  5:00 PM  Result Value Ref Range   Opiates POSITIVE (A) NONE DETECTED   Cocaine POSITIVE (A) NONE DETECTED   Benzodiazepines NONE DETECTED NONE DETECTED   Amphetamines POSITIVE (A) NONE DETECTED   Tetrahydrocannabinol NONE DETECTED NONE DETECTED   Barbiturates NONE DETECTED NONE DETECTED  Hepatitis panel, acute     Status: None   Collection Time: 05/05/23  5:00 PM  Result Value Ref Range   Hepatitis B Surface Ag NON REACTIVE NON REACTIVE   HCV Ab NON REACTIVE NON REACTIVE   Hep A IgM NON REACTIVE NON REACTIVE   Hep B C IgM NON REACTIVE NON REACTIVE  HIV Antibody (routine testing w rflx)     Status: None   Collection Time: 05/05/23  5:00 PM  Result Value Ref Range   HIV Screen 4th Generation wRfx Non Reactive Non Reactive  C-reactive protein     Status: Abnormal   Collection Time: 05/05/23  5:00 PM  Result Value Ref Range   CRP 2.0 (H) <1.0 mg/dL  Sedimentation rate     Status: None   Collection Time: 05/05/23  5:00 PM  Result Value Ref Range   Sed Rate 3 0 - 16 mm/hr  CBG monitoring, ED     Status: Abnormal   Collection Time: 05/05/23  5:21 PM  Result Value Ref Range   Glucose-Capillary 196 (H) 70 - 99 mg/dL  CBG monitoring, ED     Status: Abnormal   Collection Time: 05/05/23  8:12 PM  Result Value Ref Range   Glucose-Capillary 172 (H) 70 - 99 mg/dL  Heparin level (unfractionated)     Status: None   Collection Time: 05/05/23  8:38 PM  Result Value Ref Range   Heparin Unfractionated 0.41 0.30 - 0.70 IU/mL  CBG monitoring, ED     Status: Abnormal   Collection Time: 05/05/23 11:52 PM  Result Value Ref Range   Glucose-Capillary 153 (H) 70 - 99 mg/dL  Heparin level (unfractionated)     Status: None   Collection Time: 05/06/23  3:07 AM  Result Value Ref Range   Heparin Unfractionated 0.48 0.30 - 0.70 IU/mL  CBG monitoring, ED     Status: Abnormal   Collection Time:  05/06/23  5:22 AM  Result Value Ref Range   Glucose-Capillary 147 (H) 70 - 99 mg/dL  Renal function panel     Status: Abnormal   Collection Time: 05/06/23  5:30 AM  Result Value Ref Range   Sodium 134 (L) 135 - 145 mmol/L   Potassium 3.0 (L) 3.5 - 5.1 mmol/L   Chloride 101 98 - 111 mmol/L   CO2 25 22 - 32 mmol/L   Glucose, Bld 135 (H) 70 - 99 mg/dL   BUN 24 (H) 6 - 20 mg/dL   Creatinine, Ser 8.11 0.61 - 1.24 mg/dL   Calcium 8.1 (L) 8.9 - 10.3 mg/dL  Phosphorus 3.6 2.5 - 4.6 mg/dL   Albumin 3.0 (L) 3.5 - 5.0 g/dL   GFR, Estimated >40 >98 mL/min   Anion gap 8 5 - 15  Magnesium     Status: None   Collection Time: 05/06/23  5:30 AM  Result Value Ref Range   Magnesium 2.0 1.7 - 2.4 mg/dL  CBC     Status: None   Collection Time: 05/06/23  5:30 AM  Result Value Ref Range   WBC 8.6 4.0 - 10.5 K/uL   RBC 5.14 4.22 - 5.81 MIL/uL   Hemoglobin 14.7 13.0 - 17.0 g/dL   HCT 11.9 14.7 - 82.9 %   MCV 87.4 80.0 - 100.0 fL   MCH 28.6 26.0 - 34.0 pg   MCHC 32.7 30.0 - 36.0 g/dL   RDW 56.2 13.0 - 86.5 %   Platelets 270 150 - 400 K/uL   nRBC 0.0 0.0 - 0.2 %  CBG monitoring, ED     Status: Abnormal   Collection Time: 05/06/23  8:20 AM  Result Value Ref Range   Glucose-Capillary 111 (H) 70 - 99 mg/dL  CBG monitoring, ED     Status: Abnormal   Collection Time: 05/06/23 12:25 PM  Result Value Ref Range   Glucose-Capillary 181 (H) 70 - 99 mg/dL   Basic Metabolic Panel: Recent Labs  Lab 05/04/23 1927 05/06/23 0530  NA 132* 134*  K 3.7 3.0*  CL 101 101  CO2 20* 25  GLUCOSE 182* 135*  BUN 20 24*  CREATININE 1.22 1.16  CALCIUM 8.5* 8.1*  MG  --  2.0  PHOS  --  3.6   Liver Function Tests: Recent Labs  Lab 05/04/23 1927 05/06/23 0530  AST 33  --   ALT 28  --   ALKPHOS 79  --   BILITOT 1.0  --   PROT 7.1  --   ALBUMIN 3.4* 3.0*   No results for input(s): "LIPASE", "AMYLASE" in the last 168 hours. No results for input(s): "AMMONIA" in the last 168 hours. CBC: Recent Labs   Lab 05/04/23 1927 05/06/23 0530  WBC 10.3 8.6  NEUTROABS 7.1  --   HGB 15.2 14.7  HCT 46.3 44.9  MCV 86.9 87.4  PLT 312 270   Cardiac Enzymes: No results for input(s): "CKTOTAL", "CKMB", "CKMBINDEX", "TROPONINIHS" in the last 168 hours.  BNP (last 3 results) No results for input(s): "PROBNP" in the last 8760 hours. CBG: Recent Labs  Lab 05/05/23 2012 05/05/23 2352 05/06/23 0522 05/06/23 0820 05/06/23 1225  GLUCAP 172* 153* 147* 111* 181*    Radiological Exams on Admission:  DG Chest Port 1 View Result Date: 05/06/2023 CLINICAL DATA:  Congestive heart failure. EXAM: PORTABLE CHEST 1 VIEW COMPARISON:  04/18/2023 FINDINGS: Unchanged cardiac enlargement. No signs of pleural effusion, interstitial edema or airspace disease. Visualized osseous structures are unremarkable. IMPRESSION: Cardiomegaly. No acute findings. Electronically Signed   By: Signa Kell M.D.   On: 05/06/2023 06:10   VAS [place] LOWER EXTREMITY VENOUS (DVT) Result Date: 05/05/2023  Lower Venous DVT Study Patient Name:  KEVONTE ARENSDORF  Date of Exam:   05/05/2023 Medical Rec #: [id number]       Accession #:    [id number] Date of Birth: [birth year]       Patient Gender: M Patient Age:   17 years Exam Location[facility]  [facility] Procedure:      VAS [place] LOWER EXTREMITY VENOUS (DVT) Referring Phys: Alwyn Ren GONFA --------------------------------------------------------------------------------  Indications: Elevated Ddimer.  Risk Factors: None identified. Limitations: Open wound. Comparison Study: No prior studies. Performing Technologist: Chanda Busing RVT  Examination Guidelines: A complete evaluation includes B-mode imaging, spectral Doppler, color Doppler, and power Doppler as needed of all accessible portions of each vessel. Bilateral testing is considered an integral part of a complete examination. Limited examinations for reoccurring indications may be performed as noted. The reflux portion of the exam is performed with  the patient in reverse Trendelenburg.  +---------+---------------+---------+-----------+----------+--------------+ RIGHT    CompressibilityPhasicitySpontaneityPropertiesThrombus Aging +---------+---------------+---------+-----------+----------+--------------+ CFV      Full           Yes      Yes                                 +---------+---------------+---------+-----------+----------+--------------+ SFJ      Full                                                        +---------+---------------+---------+-----------+----------+--------------+ FV Prox  Full                                                        +---------+---------------+---------+-----------+----------+--------------+ FV Mid   Full                                                        +---------+---------------+---------+-----------+----------+--------------+ FV DistalFull                                                        +---------+---------------+---------+-----------+----------+--------------+ PFV      Full                                                        +---------+---------------+---------+-----------+----------+--------------+ POP      Full           Yes      Yes                                 +---------+---------------+---------+-----------+----------+--------------+ PTV      Full                                                        +---------+---------------+---------+-----------+----------+--------------+ PERO     Full                                                        +---------+---------------+---------+-----------+----------+--------------+   +---------+---------------+---------+-----------+----------+--------------+  LEFT     CompressibilityPhasicitySpontaneityPropertiesThrombus Aging +---------+---------------+---------+-----------+----------+--------------+ CFV      Full           Yes      Yes                                  +---------+---------------+---------+-----------+----------+--------------+ SFJ      Full                                                        +---------+---------------+---------+-----------+----------+--------------+ FV Prox  Full                                                        +---------+---------------+---------+-----------+----------+--------------+ FV Mid   Full                                                        +---------+---------------+---------+-----------+----------+--------------+ FV DistalFull                                                        +---------+---------------+---------+-----------+----------+--------------+ PFV      Full                                                        +---------+---------------+---------+-----------+----------+--------------+ POP      Full           Yes      Yes                                 +---------+---------------+---------+-----------+----------+--------------+ PTV      Full                                                        +---------+---------------+---------+-----------+----------+--------------+ PERO     Full                                                        +---------+---------------+---------+-----------+----------+--------------+     Summary: RIGHT: - There is no evidence of deep vein thrombosis in the lower extremity. However, portions of this examination were limited- see technologist comments above.  - No cystic structure found in the popliteal fossa.  LEFT: - There is no evidence  of deep vein thrombosis in the lower extremity. However, portions of this examination were limited- see technologist comments above.  - No cystic structure found in the popliteal fossa.  *See table(s) above for measurements and observations. Electronically signed by Lemar Livings MD on 05/05/2023 at 1:38:32 PM.    Final    VAS [place] ABI WITH/WO TBI Result Date: 05/05/2023  LOWER EXTREMITY  DOPPLER STUDY Patient Name:  JOHNATON ORMOND  Date of Exam:   05/05/2023 Medical Rec #: [id number]       Accession #:    [id number] Date of Birth: Sep 07, 1965       Patient Gender: M Patient Age:   39 years Exam Location:  [facility] Procedure:      VAS [place] ABI WITH/WO TBI Referring Phys: ELIZABETH REES --------------------------------------------------------------------------------  Indications: Rest pain, and ulceration. High Risk Factors: Hypertension, hyperlipidemia, Diabetes.  Limitations: Today's exam was limited due to an open wound and involuntary              patient movement. Comparison Study: No prior studies. Performing Technologist: Olen Cordial RVT  Examination Guidelines: A complete evaluation includes at minimum, Doppler waveform signals and systolic blood pressure reading at the level of bilateral brachial, anterior tibial, and posterior tibial arteries, when vessel segments are accessible. Bilateral testing is considered an integral part of a complete examination. Photoelectric Plethysmograph (PPG) waveforms and toe systolic pressure readings are included as required and additional duplex testing as needed. Limited examinations for reoccurring indications may be performed as noted.  ABI Findings: +---------+------------------+-----+-----------+--------+ Right    Rt Pressure (mmHg)IndexWaveform   Comment  +---------+------------------+-----+-----------+--------+ Brachial 95                     triphasic           +---------+------------------+-----+-----------+--------+ PTA      119               1.10 multiphasic         +---------+------------------+-----+-----------+--------+ DP       109               1.01 multiphasic         +---------+------------------+-----+-----------+--------+ Great Toe70                0.65                     +---------+------------------+-----+-----------+--------+ +---------+------------------+-----+-----------+-------+ Left     Lt  Pressure (mmHg)IndexWaveform   Comment +---------+------------------+-----+-----------+-------+ Brachial 108                    triphasic          +---------+------------------+-----+-----------+-------+ PTA      112               1.04 multiphasic        +---------+------------------+-----+-----------+-------+ DP       241               2.23 multiphasic        +---------+------------------+-----+-----------+-------+ Great Toe83                0.77                    +---------+------------------+-----+-----------+-------+ +-------+-----------+-----------+------------+------------+ ABI/TBIToday's ABIToday's TBIPrevious ABIPrevious TBI +-------+-----------+-----------+------------+------------+ Right  1.1        0.65                                +-------+-----------+-----------+------------+------------+  Left   [place]         0.77                                +-------+-----------+-----------+------------+------------+  Summary: Right: Resting right ankle-brachial index is within normal range. The right toe-brachial index is abnormal. Left: Resting left ankle-brachial index indicates noncompressible left lower extremity arteries. The left toe-brachial index is normal. *See table(s) above for measurements and observations.  Electronically signed by Lemar Livings MD on 05/05/2023 at 1:38:24 PM.    Final    ECHOCARDIOGRAM COMPLETE Result Date: 05/05/2023    ECHOCARDIOGRAM REPORT   Patient Name:   TABIAS FICK Date of Exam: 05/05/2023 Medical Rec #:  [id number]      Height:       77.0 in Accession #:    [id number]     Weight:       260.0 lb Date of Birth:  Jul 09, 1965      BSA:          2.501 m Patient Age:    57 years       BP:           106/79 mmHg Patient Gender: M              HR:           100 bpm. Exam Location:  Inpatient Procedure: 2D Echo, Cardiac Doppler, Color Doppler and Intracardiac            Opacification Agent Indications:    CHF  History:        Patient has  prior history of Echocardiogram examinations, most                 recent 08/23/2020. Cardiomyopathy, Previous Myocardial Infarction,                 Arrythmias:Atrial Fibrillation; Risk Factors:Diabetes,                 Hypertension and Sleep Apnea.  Sonographer:    Darlys Gales Referring Phys: [id number] HAZEL V DUNCAN IMPRESSIONS  1. Left ventricular ejection fraction, by estimation, is 20%. The left ventricle has severely decreased function. The left ventricle demonstrates global hypokinesis. The left ventricular internal cavity size was mildly dilated. Left ventricular diastolic parameters are consistent with Grade III diastolic dysfunction (restrictive). There was a thrombus at the LV apex.  2. Right ventricular systolic function is moderately reduced. The right ventricular size is mildly enlarged. There is moderately elevated pulmonary artery systolic pressure. The estimated right ventricular systolic pressure is 59.6 mmHg.  3. Left atrial size was moderately dilated.  4. Right atrial size was mildly dilated.  5. The mitral valve is abnormal. Moderate mitral valve regurgitation. No evidence of mitral stenosis.  6. The aortic valve is tricuspid. There is mild calcification of the aortic valve. Aortic valve regurgitation is not visualized. No aortic stenosis is present.  7. The inferior vena cava is dilated in size with <50% respiratory variability, suggesting right atrial pressure of 15 mmHg. FINDINGS  Left Ventricle: Left ventricular ejection fraction, by estimation, is 20%. The left ventricle has severely decreased function. The left ventricle demonstrates global hypokinesis. The left ventricular internal cavity size was mildly dilated. There is no left ventricular hypertrophy. Left ventricular diastolic parameters are consistent with Grade III diastolic dysfunction (restrictive). Right Ventricle: The right ventricular size is mildly enlarged. No increase in right ventricular wall thickness.  Right ventricular  systolic function is moderately reduced. There is moderately elevated pulmonary artery systolic pressure. The tricuspid regurgitant velocity is 3.34 m/s, and with an assumed right atrial pressure of 15 mmHg, the estimated right ventricular systolic pressure is 59.6 mmHg. Left Atrium: Left atrial size was moderately dilated. Right Atrium: Right atrial size was mildly dilated. Pericardium: Trivial pericardial effusion is present. Mitral Valve: The mitral valve is abnormal. Mild mitral annular calcification. Moderate mitral valve regurgitation. No evidence of mitral valve stenosis. Tricuspid Valve: The tricuspid valve is normal in structure. Tricuspid valve regurgitation is mild. Aortic Valve: The aortic valve is tricuspid. There is mild calcification of the aortic valve. Aortic valve regurgitation is not visualized. No aortic stenosis is present. Aortic valve mean gradient measures 3.0 mmHg. Aortic valve peak gradient measures 4.5 mmHg. Aortic valve area, by VTI measures 2.32 cm. Pulmonic Valve: The pulmonic valve was normal in structure. Pulmonic valve regurgitation is not visualized. Aorta: The aortic root is normal in size and structure. Venous: The inferior vena cava is dilated in size with less than 50% respiratory variability, suggesting right atrial pressure of 15 mmHg. IAS/Shunts: No atrial level shunt detected by color flow Doppler.  LEFT VENTRICLE PLAX 2D LVIDd:         6.20 cm LVIDs:         5.80 cm LV PW:         0.90 cm LV IVS:        0.60 cm LVOT diam:     2.10 cm LV SV:         33 LV SV Index:   13 LVOT Area:     3.46 cm  RIGHT VENTRICLE RV S prime:     10.30 cm/s TAPSE (M-mode): 0.8 cm LEFT ATRIUM              Index        RIGHT ATRIUM           Index LA Vol (A2C):   155.0 ml 61.98 ml/m  RA Area:     22.10 cm LA Vol (A4C):   117.0 ml 46.79 ml/m  RA Volume:   71.30 ml  28.51 ml/m LA Biplane Vol: 135.0 ml 53.99 ml/m  AORTIC VALVE AV Area (Vmax):    2.05 cm AV Area (Vmean):   2.03 cm AV Area  (VTI):     2.32 cm AV Vmax:           106.00 cm/s AV Vmean:          75.700 cm/s AV VTI:            0.143 m AV Peak Grad:      4.5 mmHg AV Mean Grad:      3.0 mmHg LVOT Vmax:         62.70 cm/s LVOT Vmean:        44.400 cm/s LVOT VTI:          0.096 m LVOT/AV VTI ratio: 0.67  AORTA Ao Root diam: 3.20 cm MITRAL VALVE                TRICUSPID VALVE MV Area (PHT): 6.17 cm     TR Peak grad:   44.6 mmHg MV Decel Time: 123 msec     TR Vmax:        334.00 cm/s MV E velocity: 110.00 cm/s  SHUNTS                             Systemic VTI:  0.10 m                             Systemic Diam: 2.10 cm Dalton McleanMD Electronically signed by Wilfred Lacy Signature Date/Time: 05/05/2023/11:36:02 AM    Final    DG Tibia/Fibula Right Result Date: 05/04/2023 CLINICAL DATA:  Diabetic wounds.  Pain and redness. EXAM: RIGHT TIBIA AND FIBULA - 2 VIEW COMPARISON:  None Available. FINDINGS: There is no evidence of fracture or other focal bone lesions. No erosive change or periostitis. Degenerative change of the knee and ankle. Chondrocalcinosis of the knee. Areas of subcutaneous edema, no soft tissue gas or radiopaque foreign body. IMPRESSION: 1. Subcutaneous edema. No soft tissue gas or radiopaque foreign body. 2. No radiographic evidence of osteomyelitis. 3. Degenerative change of the knee and ankle. Electronically Signed   By: Narda Rutherford M.D.   On: 05/04/2023 21:03   DG Tibia/Fibula Left Result Date: 05/04/2023 CLINICAL DATA:  Bilateral lower extremity diabetic wounds EXAM: LEFT TIBIA AND FIBULA - 2 VIEW COMPARISON:  None Available. FINDINGS: Normal alignment. No acute fracture or dislocation. Degenerative changes are noted within the left knee and left ankle. No osseous erosions or abnormal periosteal reaction. Mild vascular calcification noted. IMPRESSION: 1. Degenerative changes. No radiographic evidence of osteomyelitis. Electronically Signed   By: Helyn Numbers M.D.   On: 05/04/2023  21:02      Family Communication: per pateint.  Disposition: Status is: Inpatient Remains inpatient appropriate because: needs further evaluation of ulceration of lower extremities.  Planned Discharge Destination: Home    Time spent: 45 minutes  Author: Nolberto Hanlon, MD 05/06/2023 12:29 PM  For on call review [email or web address].

## 2023-05-06 NOTE — Progress Notes (Signed)
PHARMACY - ANTICOAGULATION CONSULT NOTE  Pharmacy Consult for IV heparin Indication: LV thrombus  No Known Allergies  Patient Measurements:   Heparin Dosing Weight: 113 kg  Vital Signs: Temp: 97.5 F (36.4 C) (12/17 2322) Temp Source: Oral (12/17 2322) BP: 99/88 (12/18 0240) Pulse Rate: 94 (12/18 0240)  Labs: Recent Labs    05/04/23 1927 05/05/23 2038 05/06/23 0307  HGB 15.2  --   --   HCT 46.3  --   --   PLT 312  --   --   HEPARINUNFRC  --  0.41 0.48  CREATININE 1.22  --   --     Estimated Creatinine Clearance: 95.1 mL/min (by C-G formula based on SCr of 1.22 mg/dL).  Assessment: 52 yoM with PMH CAD, CHF known to pharmacy from antibiotic dosing. Briefly; admitted with concern for LE edema and possible DM wounds. TTE done for CHF workup shows new LV thrombus; Cardiology consulted and Pharmacy to dose IV heparin . Not on anticoagulation PTA.  Baseline CBC WNL.   Significant events:  Today, 05/06/2023: Heparin level 0.48- remains therapeutic on IV heparin 2000 units/hr CBC: WNL 12/17 SCr slightly elevated (baseline ~0.9) Per RN no bleeding/bruising or problems with IV heparin infusion  Goal of Therapy: Heparin level 0.3-0.7 units/ml Monitor platelets by anticoagulation protocol: Yes  Plan: Continue Heparin 2000 units/hr IV infusion Daily CBC, daily heparin level  Monitor for signs of bleeding or thrombosis  Junita Push, PharmD, BCPS 05/06/2023 3:53 AM

## 2023-05-06 NOTE — Progress Notes (Signed)
   05/06/23 1400  Spiritual Encounters  Type of Visit Initial  Care provided to: Pt and family  Conversation partners present during encounter Nurse  Referral source Chaplain assessment  Reason for visit Advance directives  OnCall Visit No  Spiritual Framework  Presenting Themes Courage hope and growth   Chaplain received a spiritual consult for an AD. Chaplain gave patient the packed and educated him and his daughter about the Advance Directive. Chaplain services remain available to help patient with filling out paperwork and getting it notarized.

## 2023-05-07 DIAGNOSIS — I5043 Acute on chronic combined systolic (congestive) and diastolic (congestive) heart failure: Secondary | ICD-10-CM | POA: Diagnosis not present

## 2023-05-07 DIAGNOSIS — I2510 Atherosclerotic heart disease of native coronary artery without angina pectoris: Secondary | ICD-10-CM | POA: Diagnosis not present

## 2023-05-07 DIAGNOSIS — E11628 Type 2 diabetes mellitus with other skin complications: Secondary | ICD-10-CM | POA: Diagnosis not present

## 2023-05-07 DIAGNOSIS — I513 Intracardiac thrombosis, not elsewhere classified: Secondary | ICD-10-CM | POA: Diagnosis not present

## 2023-05-07 DIAGNOSIS — L97919 Non-pressure chronic ulcer of unspecified part of right lower leg with unspecified severity: Secondary | ICD-10-CM | POA: Diagnosis not present

## 2023-05-07 DIAGNOSIS — L97929 Non-pressure chronic ulcer of unspecified part of left lower leg with unspecified severity: Secondary | ICD-10-CM | POA: Diagnosis not present

## 2023-05-07 DIAGNOSIS — I420 Dilated cardiomyopathy: Secondary | ICD-10-CM | POA: Diagnosis not present

## 2023-05-07 DIAGNOSIS — L03116 Cellulitis of left lower limb: Secondary | ICD-10-CM | POA: Diagnosis not present

## 2023-05-07 DIAGNOSIS — I776 Arteritis, unspecified: Secondary | ICD-10-CM | POA: Diagnosis not present

## 2023-05-07 DIAGNOSIS — Z9861 Coronary angioplasty status: Secondary | ICD-10-CM | POA: Diagnosis not present

## 2023-05-07 DIAGNOSIS — I110 Hypertensive heart disease with heart failure: Secondary | ICD-10-CM | POA: Diagnosis not present

## 2023-05-07 DIAGNOSIS — L03115 Cellulitis of right lower limb: Secondary | ICD-10-CM | POA: Diagnosis not present

## 2023-05-07 LAB — BASIC METABOLIC PANEL
Anion gap: 13 (ref 5–15)
BUN: 28 mg/dL — ABNORMAL HIGH (ref 6–20)
CO2: 23 mmol/L (ref 22–32)
Calcium: 8.8 mg/dL — ABNORMAL LOW (ref 8.9–10.3)
Chloride: 99 mmol/L (ref 98–111)
Creatinine, Ser: 1.17 mg/dL (ref 0.61–1.24)
GFR, Estimated: >60 mL/min (ref >60)
Glucose, Bld: 134 mg/dL — ABNORMAL HIGH (ref 70–99)
Potassium: 3.1 mmol/L — ABNORMAL LOW (ref 3.5–5.1)
Sodium: 135 mmol/L (ref 135–145)

## 2023-05-07 LAB — CBC
HCT: 48.2 % (ref 39.0–52.0)
Hemoglobin: 15.4 g/dL (ref 13.0–17.0)
MCH: 27.7 pg (ref 26.0–34.0)
MCHC: 32 g/dL (ref 30.0–36.0)
MCV: 86.8 fL (ref 80.0–100.0)
Platelets: 303 10*3/uL (ref 150–400)
RBC: 5.55 MIL/uL (ref 4.22–5.81)
RDW: 14.6 % (ref 11.5–15.5)
WBC: 8.6 10*3/uL (ref 4.0–10.5)
nRBC: 0 % (ref 0.0–0.2)

## 2023-05-07 LAB — ANCA TITERS
Atypical P-ANCA titer: <1:20 {titer}
C-ANCA: <1:20 {titer}
P-ANCA: <1:20 {titer}

## 2023-05-07 LAB — GLUCOSE, CAPILLARY
Glucose-Capillary: 130 mg/dL — ABNORMAL HIGH (ref 70–99)
Glucose-Capillary: 132 mg/dL — ABNORMAL HIGH (ref 70–99)
Glucose-Capillary: 148 mg/dL — ABNORMAL HIGH (ref 70–99)
Glucose-Capillary: 156 mg/dL — ABNORMAL HIGH (ref 70–99)
Glucose-Capillary: 162 mg/dL — ABNORMAL HIGH (ref 70–99)
Glucose-Capillary: 165 mg/dL — ABNORMAL HIGH (ref 70–99)
Glucose-Capillary: 173 mg/dL — ABNORMAL HIGH (ref 70–99)

## 2023-05-07 LAB — HEPARIN LEVEL (UNFRACTIONATED)
Heparin Unfractionated: 0.3 [IU]/mL (ref 0.30–0.70)
Heparin Unfractionated: 0.33 [IU]/mL (ref 0.30–0.70)

## 2023-05-07 LAB — PHOSPHORUS: Phosphorus: 4 mg/dL (ref 2.5–4.6)

## 2023-05-07 LAB — MAGNESIUM: Magnesium: 2.1 mg/dL (ref 1.7–2.4)

## 2023-05-07 MED ORDER — POTASSIUM CHLORIDE 20 MEQ PO PACK: 40.0000 meq | PACK | Freq: Once | ORAL | Status: DC

## 2023-05-07 MED ORDER — POTASSIUM CHLORIDE CRYS ER 20 MEQ PO TBCR
40.0000 meq | EXTENDED_RELEASE_TABLET | Freq: Four times a day (QID) | ORAL | Status: AC
  Administered 2023-05-07 (×2): 40 meq via ORAL
  Filled 2023-05-07 (×2): qty 2

## 2023-05-07 MED ORDER — HEPARIN (PORCINE) 25000 UT/250ML-% IV SOLN
2100.0000 [IU]/h | INTRAVENOUS | Status: DC
  Administered 2023-05-07 – 2023-05-08 (×2): 2100 [IU]/h via INTRAVENOUS
  Filled 2023-05-07 (×2): qty 250

## 2023-05-07 NOTE — Progress Notes (Signed)
PROGRESS NOTE    Evan [name]  FAO:[id number] DOB: 1965/12/29 DOA: 05/04/2023 PCP: Dettinger, Elige Radon, MD   Brief Narrative:  This 57 yrs old Male with PMH significant for sCHF/ICM, CAD/PCI, DM-2, polysubstance use, recent acute cholecystitis for which he was treated with antibiotics after he refused HIDA scan and biliary drain sent to ED by PCP due to bilateral lower extremity "wound infection".  He reports increased redness, pain and weeping from his legs.  He also had dyspnea on exertion and BLE edema for about 2 weeks.  He was admitted with working diagnosis of diabetic wound infection and acute on chronic CHF.  BNP about 1000.  No fever or leukocytosis.  Bilateral leg x-ray with subcutaneous edema.  Cultures obtained.  Started on antibiotics.  TTE and ABI ordered.   TTE with LVEF of 20%, GH, G3 DD, LV thrombus, moderate MR and RVSP of 59.6 mmHg.  Started on IV heparin.  Cardiology and ID consulted.  Assessment & Plan:   Principal Problem:   Vasculitis (HCC) Active Problems:   Cellulitis in diabetic foot (HCC)   CAD S/P PCI   Acute on chronic HFrEF (heart failure with reduced ejection fraction) (HCC)   History of acute cholecystitis 04/18/23 treated conservatively   Essential hypertension   Diabetic foot ulcer (HCC)   CHF (congestive heart failure) (HCC)   Bilateral leg ulcer (HCC)   Acute on chronic combined systolic and diastolic CHF (congestive heart failure) (HCC)  Bilateral LE / Hand ulceration/ infection,  Vasculitic skin lesions: Patient reports vasculitis skin lesions for 5-6 weeks. Now has purulent drainage.    History of cocaine use but patient denies substance use or IVDU other than occasional marijuana.   CRP 2.5.  ESR 1.0.  s/p vancomyicin and cefepime. ID has discontinued all abx since 05/05/2023 Blood cultures has been negative so far. Although right toe  ABI is abnormal. Lesion consistent with major vasulcar insuficiency. No DVT Wound care consulted.  ID thinks  could be cocaine or levamisole induced vasculitis. Punch biopsy done by ID on 12/18 - awaiting results. Acute hepatitis panel in wnl Pending anca, cardiolipin, beta-2-glycoprotein, ana, RF factor, cryoglobulins. ID suggested transfer to tertiary care unit where dermatology and rheumatology expertise is available. Meanwhile continue with systemic steroids.     Acute on chronic systolic CHF /BiV HF/LV thrombus:  TTE with LVEF of 20%, GH, G3 DD, LV thrombus, moderate MR and RVSP of 59.6 mmHg.   TTE finding worse compared to his TTE in 2022.  Had DOE, significant edema.  BNP elevated to 1000.  -Continue IV Lasix 40 mg twice daily, monitor weight and I/o -Deferred metoprolol as UDS + ve for cocaine. -Continue home Aldactone and Jardiance -Hold losartan for now -Continue IV heparin for LV thrombus -Cardiology consulted, eval appreciated.   LV thrombus -incidentally discovered during echo for fluid overload, started on IV heparin -Heparin switched to Eliquis upon discharge.   NIDDM-2 with hyperglycemia: A1c 7.3% on 12/1. Last Labs           Recent Labs  Lab 05/05/23 0521 05/05/23 0754 05/05/23 1147  GLUCAP 200* 159* 114*    -Continue current insulin regimen   History of CAD/PCI: No anginal symptoms. -Continue aspirin and Lipitor   History of acute cholecystitis 04/18/23 treated conservatively with antibiotics.  No GI symptoms.   Essential hypertension: Normotensive but soft. -Hold losartan metoprolol   Elevated D-dimer: Mild.  Likely due to LV thrombus. -Check lower extremity venous Doppler -IV heparin for LV  thrombus   Tachycardia - POA. sinus   Polysubstance use - including cocaine. However, patient denies iv drug use.     DVT prophylaxis: Heparin Code Status: Full code Family Communication: No family at bed side Disposition Plan:   Status is: Inpatient Remains inpatient appropriate because: Admitted for cocaine induced vasculitis.     Consultants:   Cardiology Infectious Diseases.  Procedures:  Antimicrobials:  Anti-infectives (From admission, onward)    Start     Dose/Rate Route Frequency Ordered Stop   05/05/23 2200  Vancomycin (VANCOCIN) 1,500 mg in sodium chloride 0.9 % 500 mL IVPB  Status:  Discontinued        1,500 mg 250 mL/hr over 120 Minutes Intravenous 2 times daily 05/05/23 1120 05/05/23 1625   05/05/23 1130  vancomycin (VANCOREADY) IVPB 2000 mg/400 mL        2,000 mg 200 mL/hr over 120 Minutes Intravenous  Once 05/05/23 1120 05/05/23 1432   05/05/23 0930  ceFEPIme (MAXIPIME) 2 g in sodium chloride 0.9 % 100 mL IVPB  Status:  Discontinued        2 g 200 mL/hr over 30 Minutes Intravenous Every 8 hours 05/05/23 0759 05/05/23 1625   05/05/23 0115  ceFEPIme (MAXIPIME) 2 g in sodium chloride 0.9 % 100 mL IVPB        2 g 200 mL/hr over 30 Minutes Intravenous  Once 05/05/23 0106 05/05/23 0203   05/05/23 0100  metroNIDAZOLE (FLAGYL) tablet 500 mg  Status:  Discontinued        500 mg Oral Every 12 hours 05/05/23 0057 05/05/23 1625      Subjective: Patient was seen and examined at bedside.  Overnight events noted.  Patient was sitting comfortably on the edge of the bed stated he is awaiting transfer to [facility].  Objective: Vitals:   05/07/23 0409 05/07/23 0500 05/07/23 0531 05/07/23 1303  BP: 100/85  (!) 141/84 103/84  Pulse: 96  100 89  Resp: 20  20 16   Temp: 97.9 F (36.6 C)  97.8 F (36.6 C) 97.7 F (36.5 C)  TempSrc: Oral  Oral Oral  SpO2: 98%  99%   Weight:  111.9 kg    Height:        Intake/Output Summary (Last 24 hours) at 05/07/2023 1458 Last data filed at 05/07/2023 1441 Gross per 24 hour  Intake 1186.08 ml  Output 2600 ml  Net -1413.92 ml   Filed Weights   05/06/23 1359 05/07/23 0500  Weight: 114.4 kg 111.9 kg    Examination:  General exam: Appears calm and comfortable, not in any acute distress Respiratory system: Clear to auscultation. Respiratory effort normal.  RR  16 Cardiovascular system: S1 & S2 heard, RRR. No JVD, murmurs, rubs, gallops or clicks. Gastrointestinal system: Abdomen is non distended, soft and non tender. Normal bowel sounds heard. Central nervous system: Alert and oriented x 3. No focal neurological deficits. Extremities: Edema+, patient has ulcers on the legs both thighs seems chronic appearing. Skin: No rashes, lesions or ulcers Psychiatry: Judgement and insight appear normal. Mood & affect appropriate.     Data Reviewed: I have personally reviewed following labs and imaging studies  CBC: Recent Labs  Lab 05/04/23 1927 05/06/23 0530 05/07/23 0434  WBC 10.3 8.6 8.6  NEUTROABS 7.1  --   --   HGB 15.2 14.7 15.4  HCT 46.3 44.9 48.2  MCV 86.9 87.4 86.8  PLT 312 270 303   Basic Metabolic Panel: Recent Labs  Lab 05/04/23 1927  05/06/23 0530 05/06/23 1537 05/07/23 0434  NA 132* 134* 132* 135  K 3.7 3.0* 3.9 3.1*  CL 101 101 100 99  CO2 20* 25 22 23   GLUCOSE 182* 135* 171* 134*  BUN 20 24* 24* 28*  CREATININE 1.22 1.16 1.15 1.17  CALCIUM 8.5* 8.1* 8.5* 8.8*  MG  --  2.0  --  2.1  PHOS  --  3.6  --  4.0   GFR: Estimated Creatinine Clearance: 96.8 mL/min (by C-G formula based on SCr of 1.17 mg/dL). Liver Function Tests: Recent Labs  Lab 05/04/23 1927 05/06/23 0530  AST 33  --   ALT 28  --   ALKPHOS 79  --   BILITOT 1.0  --   PROT 7.1  --   ALBUMIN 3.4* 3.0*   No results for input(s): "LIPASE", "AMYLASE" in the last 168 hours. No results for input(s): "AMMONIA" in the last 168 hours. Coagulation Profile: No results for input(s): "INR", "PROTIME" in the last 168 hours. Cardiac Enzymes: No results for input(s): "CKTOTAL", "CKMB", "CKMBINDEX", "TROPONINI" in the last 168 hours. BNP (last 3 results) No results for input(s): "PROBNP" in the last 8760 hours. HbA1C: No results for input(s): "HGBA1C" in the last 72 hours. CBG: Recent Labs  Lab 05/06/23 1957 05/07/23 0016 05/07/23 0403 05/07/23 0749  05/07/23 1151  GLUCAP 137* 130* 148* 132* 156*   Lipid Profile: No results for input(s): "CHOL", "HDL", "LDLCALC", "TRIG", "CHOLHDL", "LDLDIRECT" in the last 72 hours. Thyroid Function Tests: No results for input(s): "TSH", "T4TOTAL", "FREET4", "T3FREE", "THYROIDAB" in the last 72 hours. Anemia Panel: No results for input(s): "VITAMINB12", "FOLATE", "FERRITIN", "TIBC", "IRON", "RETICCTPCT" in the last 72 hours. Sepsis Labs: Recent Labs  Lab 05/04/23 2324  LATICACIDVEN 1.7    Recent Results (from the past 240 hours)  Blood Cultures x 2 sites     Status: None (Preliminary result)   Collection Time: 05/05/23 12:05 AM   Specimen: BLOOD  Result Value Ref Range Status   Specimen Description   Final    BLOOD LEFT ANTECUBITAL Performed at [facility], [address], [place], [place] [address]    Special Requests   Final    BOTTLES DRAWN AEROBIC AND ANAEROBIC Blood Culture adequate volume Performed at [facility], [address], [place], [place] [address]    Culture   Final    NO GROWTH 2 DAYS Performed at [facility], [address] [place], [place] [address]    Report Status PENDING  Incomplete  Blood Cultures x 2 sites     Status: None (Preliminary result)   Collection Time: 05/05/23 12:17 AM   Specimen: BLOOD  Result Value Ref Range Status   Specimen Description   Final    BLOOD RIGHT ANTECUBITAL Performed at [facility], [address], [place], [place] [address]    Special Requests   Final    BOTTLES DRAWN AEROBIC AND ANAEROBIC Blood Culture adequate volume Performed at [facility], [address] [place], [place] [address]    Culture   Final    NO GROWTH 2 DAYS Performed at [facility], [address], [place], [place] [address]    Report Status PENDING  Incomplete    Radiology Studies: DG Chest Port 1 View Result Date: 05/06/2023 CLINICAL DATA:  Congestive heart  failure. EXAM: PORTABLE CHEST 1 VIEW COMPARISON:  04/18/2023 FINDINGS: Unchanged cardiac enlargement. No signs of pleural effusion, interstitial edema or airspace  disease. Visualized osseous structures are unremarkable. IMPRESSION: Cardiomegaly. No acute findings. Electronically Signed   By: Signa Kell M.D.   On: 05/06/2023 06:10   Scheduled Meds:  aspirin EC  81 mg Oral Q breakfast   atorvastatin  80 mg Oral Daily   empagliflozin  25 mg Oral Daily   famotidine  20 mg Oral BID   fenofibrate  54 mg Oral Daily   furosemide  80 mg Intravenous Q12H   insulin aspart  0-15 Units Subcutaneous Q4H   mupirocin cream   Topical BID   spironolactone  12.5 mg Oral Daily   Continuous Infusions:  heparin 2,100 Units/hr (05/07/23 0943)     LOS: 2 days    Time spent: 50 mins    Willeen Niece, MD [facility]   If 7PM-7AM, please contact night-coverage

## 2023-05-07 NOTE — Progress Notes (Signed)
Patient Name: Evan [name] Date of Encounter: 05/07/2023 [facility] Cardiologist: Nanetta Batty, MD   Interval Summary  .    Reports no significant complaints.  No chest pain or shortness of breath.  States that his peripheral edema is generally unchanged.  Still volume up, tachycardic today.  Vital Signs .    Vitals:   05/06/23 2330 05/07/23 0409 05/07/23 0500 05/07/23 0531  BP: 105/71 100/85  (!) 141/84  Pulse: (!) 102 96  100  Resp: 20 20  20   Temp: 98.2 F (36.8 C) 97.9 F (36.6 C)  97.8 F (36.6 C)  TempSrc: Oral Oral  Oral  SpO2: 93% 98%  99%  Weight:   111.9 kg   Height:        Intake/Output Summary (Last 24 hours) at 05/07/2023 1022 Last data filed at 05/07/2023 1009 Gross per 24 hour  Intake 946.08 ml  Output 2600 ml  Net -1653.92 ml      05/07/2023    5:00 AM 05/06/2023    1:59 PM 05/04/2023    4:04 PM  Last 3 Weights  Weight (lbs) 246 lb 9.6 oz 252 lb 3.2 oz 260 lb  Weight (kg) 111.857 kg 114.397 kg 117.935 kg      Telemetry/ECG    Sinus heart rates between 100-110 with PVCs.- Personally Reviewed  CV Studies    Echocardiogram 05/05/2023  1. Left ventricular ejection fraction, by estimation, is 20%. The left  ventricle has severely decreased function. The left ventricle demonstrates  global hypokinesis. The left ventricular internal cavity size was mildly  dilated. Left ventricular  diastolic parameters are consistent with Grade III diastolic dysfunction  (restrictive). There was a thrombus at the LV apex.   2. Right ventricular systolic function is moderately reduced. The right  ventricular size is mildly enlarged. There is moderately elevated  pulmonary artery systolic pressure. The estimated right ventricular  systolic pressure is 59.6 mmHg.   3. Left atrial size was moderately dilated.   4. Right atrial size was mildly dilated.   5. The mitral valve is abnormal. Moderate mitral valve regurgitation. No  evidence of mitral  stenosis.   6. The aortic valve is tricuspid. There is mild calcification of the  aortic valve. Aortic valve regurgitation is not visualized. No aortic  stenosis is present.   7. The inferior vena cava is dilated in size with <50% respiratory  variability, suggesting right atrial pressure of 15 mmHg.   Left heart catheterization 11/24/2018 Prox RCA to Mid RCA lesion is 95% stenosed. Dist RCA lesion is 30% stenosed. RPAV lesion is 100% stenosed. Dist Cx lesion is 99% stenosed. There is severe left ventricular systolic dysfunction. LV end diastolic pressure is severely elevated. The left ventricular ejection fraction is less than 25% by visual estimate. Ost LAD to Prox LAD lesion is 100% stenosed. Post intervention, there is a 0% residual stenosis. A drug-eluting stent was successfully placed using a STENT RESOLUTE ONYX 3.5X22. A drug-eluting stent was successfully placed using a STENT RESOLUTE ONYX [id number]. Mid LAD-1 lesion is 30% stenosed. Mid LAD-2 lesion is 100% stenosed. Post intervention, there is a 0% residual stenosis.   1.  Severe three-vessel coronary artery disease.  Occluded ostial and mid LAD likely due to late presenting anterior STEMI.  Significant distal left circumflex disease but the supplied territory is small.  Significant mid RCA stenosis with occluded posterior AV groove with left-to-right collaterals. 2.  Severely reduced LV systolic function with an EF of 20  to 25% with anterior wall akinesis.  LVEDP was 32 mmHg. 3.  Successful PCI and drug-eluting stent placement to the ostial as well as mid LAD.   Staged procedure 11/26/2018 Previously placed Ost LAD to Prox LAD drug eluting stent is widely patent. Balloon angioplasty was performed. Previously placed Mid LAD-2 drug eluting stent is widely patent. Balloon angioplasty was performed. Mid LAD-1 lesion is 30% stenosed. Dist Cx lesion is 99% stenosed. Prox RCA to Mid RCA lesion is 95% stenosed. Dist RCA lesion is 30%  stenosed. RPAV lesion is 100% stenosed. Post intervention, there is a 0% residual stenosis. A drug-eluting stent was successfully placed using a STENT RESOLUTE ONYX 2.75X18.   1.  Widely patent LAD stents with unchanged coronary anatomy. 2.  LVEDP was 13 mmHg with mild hypotension.  The patient was given 250 normal saline bolus. 3.  Successful angioplasty and drug-eluting stent placement to the proximal/mid right coronary artery.   Physical Exam .   GEN: No acute distress.   Neck: + JVD Cardiac: RRR, no murmurs, rubs, or gallops.   Respiratory: Clear to auscultation bilaterally. GI: Soft, nontender, non-distended  MS: 2+ edema  Wrapped lower leg wounds  Patient Profile    Evan [name] is a 57 y.o. male has hx of  CAD status post PCI 11/2018 (DES to LAD and R PDA),ischemic cardiomyopathy (LVEF 35%), polysubstance abuse, PAF, OSA, and diabetes  and admitted on 05/05/2023.  Currently patient being evaluated for necrotic looking leg wounds, acute on chronic HFrEF with worsening EF, LV thrombus.  Previous LVEF was 35 to 40%.  Assessment & Plan .     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  Moderately reduced RV function with a RVSP of 59.6.  With new LV thrombus cannot exclude possible infarct however cannot also exclude cocaine induced cardiomyopathy.  No obvious evidence of ischemic symptoms and with concurrent cocaine high risk cath candidate.  For now we will medically manage with no plans for ischemic evaluation for the time being.    Still volume up on physical exam but asymptomatic and appears to be diuresing better on increased dose of IV Lasix 80 mg.  Potassium is being supplemented.  May consider increasing spironolactone to 25 mg daily if BP remains stable. Continue Jardiance 25 mg, spironolactone 12.5 mg.  Reports compliancy with  meds. Eventually may consider carvedilol given cocaine history when more diuresed  New LV thrombus Noted on surface echocardiogram medically managing with IV heparin.  Transition back to [facility] at discharge.  Defer to MD duration of IV heparin  CAD status post DES to proximal LAD and staged RCA 2020 No anginal symptoms here. Continue aspirin, atorvastatin, fenofibrate.    Polysubstance abuse UDS positive for cocaine, opioids, amphetamines.  Reports 30+ year history of cocaine.  Hypokalemia Supplementing  Vasculitic skin lesions on lower extremity Manage per infectious disease.  Recommended transfer to tertiary facility.  Reports these been chronic for at least 2 to 3 months.  Punch biopsy pending.   For questions or updates, please contact [facility] Please consult [email or web address] for contact info under        Signed, Abagail Kitchens, PA-C

## 2023-05-07 NOTE — TOC Initial Note (Signed)
Transition of Care [facility]) - Initial/Assessment Note    Patient Details  Name: Evan [name] MRN: [id number] Date of Birth: [birth year]  Transition of Care [facility]) CM/SW Contact:    Howell Rucks, RN Phone Number: 05/07/2023, 11:33 AM  Clinical Narrative: Met with pt at bedside to introduce role of TOC/NCM and review for dc planning. Pt reports she has an established PCP and pharmacy, no current home care services or home DME, reports she lives with his dtr and feels safe returning home, confirmed he has transportation available at discharge. PT eval, await recommendation. TOC will continue to follow.                  Expected Discharge Plan: Home w Home Health Services Barriers to Discharge: Continued Medical Work up   Patient Goals and CMS Choice Patient states their goals for this hospitalization and ongoing recovery are:: return home with family          Expected Discharge Plan and Services       Living arrangements for the past 2 months: Single Family Home                                      Prior Living Arrangements/Services Living arrangements for the past 2 months: Single Family Home Lives with:: Adult Children Patient language and need for interpreter reviewed:: Yes Do you feel safe going back to the place where you live?: Yes      Need for Family Participation in Patient Care: Yes (Comment) Care giver support system in place?: Yes (comment)   Criminal Activity/Legal Involvement Pertinent to Current Situation/Hospitalization: No - Comment as needed  Activities of Daily Living   ADL Screening (condition at time of admission) Independently performs ADLs?: Yes (appropriate for developmental age) Is the patient deaf or have difficulty hearing?: No Does the patient have difficulty seeing, even when wearing glasses/contacts?: No Does the patient have difficulty concentrating, remembering, or making decisions?: No  Permission Sought/Granted                   Emotional Assessment Appearance:: Appears stated age Attitude/Demeanor/Rapport: Gracious Affect (typically observed): Accepting Orientation: : Oriented to Self, Oriented to Place, Oriented to  Time, Oriented to Situation Alcohol / Substance Use: Alcohol Use Psych Involvement: No (comment)  Admission diagnosis:  CHF (congestive heart failure) (HCC) [I50.9] Diabetic foot ulcer (HCC) [W09.811, L97.509] Wound infection [T14.8XXA, L08.9] Bilateral leg ulcer (HCC) [L97.919, L97.929] Acute on chronic combined systolic and diastolic CHF (congestive heart failure) (HCC) [I50.43] Cellulitis of lower extremity, unspecified laterality [L03.119] Patient Active Problem List   Diagnosis Date Noted   Diabetic foot ulcer (HCC) 05/05/2023   History of acute cholecystitis 04/18/23 treated conservatively 05/05/2023   Acute on chronic HFrEF (heart failure with reduced ejection fraction) (HCC) 05/05/2023   Diabetic ulcer of lower leg (HCC) 05/05/2023   Cellulitis in diabetic foot (HCC) 05/05/2023   CHF (congestive heart failure) (HCC) 05/05/2023   Bilateral leg ulcer (HCC) 05/05/2023   Acute on chronic combined systolic and diastolic CHF (congestive heart failure) (HCC) 05/05/2023   Vasculitis (HCC) 05/05/2023   Abdominal pain 04/19/2023   Elevated brain natriuretic peptide (BNP) level 04/19/2023   Elevated troponin 04/19/2023   Elevated d-dimer 04/19/2023   Type 2 diabetes mellitus with hyperglycemia (HCC) 04/19/2023   Acute cholecystitis 04/18/2023   Hypertension 10/06/2022   Type 2 diabetes mellitus with complications (HCC)  02/15/2021   CAD S/P PCI 12/09/2018   Left ventricular dysfunction    History of non-ST elevation myocardial infarction (NSTEMI) 11/24/2018   Obesity (BMI 30.0-34.9) 01/05/2018   GERD (gastroesophageal reflux disease) 07/08/2017   Mixed hyperlipidemia 12/22/2016   Essential hypertension 09/22/2016   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) 09/22/2016    Gout 09/22/2016   PCP:  Dettinger, Elige Radon, MD Pharmacy:   [facility] [phone] - [place], [place] - [address] [place] [address] Phone: [phone] Fax: [phone]     Social Drivers of Health (SDOH) Social History: SDOH Screenings   Food Insecurity: No Food Insecurity (05/06/2023)  Housing: Unknown (05/06/2023)  Transportation Needs: No Transportation Needs (05/06/2023)  Utilities: Not At Risk (05/06/2023)  Depression (PHQ2-9): Medium Risk (05/04/2023)  Financial Resource Strain: Medium Risk (10/07/2022)   Received from [facility]  Tobacco Use: Low Risk  (05/04/2023)   SDOH Interventions:     Readmission Risk Interventions    05/07/2023   11:32 AM  Readmission Risk Prevention Plan  Transportation Screening Complete  PCP or Specialist Appt within 5-7 Days Complete  Home Care Screening Complete  Medication Review (RN CM) Complete

## 2023-05-07 NOTE — Progress Notes (Signed)
   05/06/23 2335  Provider Notification  Provider Name/Title A. Virgel Manifold NP  Date Provider Notified 05/06/23  Time Provider Notified 2335  Method of Notification Page  Notification Reason New onset of dysrhythmia  Type of New Onset of Dysrhythmia Ventricular tachycardia (9 beats)  Symptoms of New Onset of Dysrhythmia Asymptomatic  Provider response No new orders  Date of Provider Response 05/06/23  Time of Provider Response [id number]

## 2023-05-07 NOTE — Progress Notes (Signed)
   05/07/23 0531  Provider Notification  Provider Name/Title A. Virgel Manifold NP  Date Provider Notified 05/07/23  Time Provider Notified [phone]  Method of Notification Page  Notification Reason New onset of dysrhythmia  Type of New Onset of Dysrhythmia Ventricular tachycardia (15 beats)  Symptoms of New Onset of Dysrhythmia Asymptomatic  Provider response See new orders  Date of Provider Response 05/07/23  Time of Provider Response [phone]   An EKG was done and seen by NP.

## 2023-05-07 NOTE — Progress Notes (Signed)
PHARMACY - ANTICOAGULATION CONSULT NOTE  Pharmacy Consult for IV heparin Indication: LV thrombus  No Known Allergies  Patient Measurements: Height: 6\' 5"  (195.6 cm) Weight: 111.9 kg (246 lb 9.6 oz) IBW/kg (Calculated) : 89.1 Heparin Dosing Weight: 113 kg  Vital Signs: Temp: 97.8 F (36.6 C) (12/19 0531) Temp Source: Oral (12/19 0531) BP: 141/84 (12/19 0531) Pulse Rate: 100 (12/19 0531)  Labs: Recent Labs    05/04/23 1927 05/05/23 2038 05/06/23 0307 05/06/23 0530 05/06/23 1537 05/07/23 0434  HGB 15.2  --   --  14.7  --  15.4  HCT 46.3  --   --  44.9  --  48.2  PLT 312  --   --  270  --  303  HEPARINUNFRC  --  0.41 0.48  --   --  0.30  CREATININE 1.22  --   --  1.16 1.15 1.17    Estimated Creatinine Clearance: 96.8 mL/min (by C-G formula based on SCr of 1.17 mg/dL).  Assessment: 32 yoM with PMH CAD, CHF known to pharmacy from antibiotic dosing. Briefly; admitted with concern for LE edema and possible DM wounds. TTE done for CHF workup shows new LV thrombus; Cardiology consulted and Pharmacy to dose IV heparin . Not on anticoagulation PTA.  Baseline CBC WNL.   Significant events:  Today, 05/07/2023: Heparin level remains therapeutic on IV heparin 2000 units/hr but has dropped to low end of therapeutic range CBC: WNL 12/19 SCr slightly elevated (baseline ~0.9) Per RN no bleeding/bruising or problems with IV heparin infusion  Goal of Therapy: Heparin level 0.3-0.7 units/ml Monitor platelets by anticoagulation protocol: Yes  Plan: Increase IV heparin from 2000 to 2100 units/hr.  Recheck heparin level in 6 hours after increase Daily CBC, daily heparin level  Monitor for signs of bleeding or thrombosis   Hessie Knows, PharmD, BCPS Secure Chat if ?s 05/07/2023 8:46 AM

## 2023-05-07 NOTE — Progress Notes (Signed)
PHARMACY - ANTICOAGULATION CONSULT NOTE  Pharmacy Consult for IV heparin Indication: LV thrombus  No Known Allergies  Patient Measurements: Height: 6\' 5"  (195.6 cm) Weight: 111.9 kg (246 lb 9.6 oz) IBW/kg (Calculated) : 89.1 Heparin Dosing Weight: 113 kg  Vital Signs: Temp: 97.7 F (36.5 C) (12/19 1303) Temp Source: Oral (12/19 1303) BP: 103/84 (12/19 1303) Pulse Rate: 89 (12/19 1303)  Labs: Recent Labs    05/04/23 1927 05/05/23 2038 05/06/23 0307 05/06/23 0530 05/06/23 1537 05/07/23 0434 05/07/23 1518  HGB 15.2  --   --  14.7  --  15.4  --   HCT 46.3  --   --  44.9  --  48.2  --   PLT 312  --   --  270  --  303  --   HEPARINUNFRC  --    < > 0.48  --   --  0.30 0.33  CREATININE 1.22  --   --  1.16 1.15 1.17  --    < > = values in this interval not displayed.    Estimated Creatinine Clearance: 96.8 mL/min (by C-G formula based on SCr of 1.17 mg/dL).  Assessment: 42 yoM with PMH CAD, CHF known to pharmacy from antibiotic dosing. Briefly; admitted with concern for LE edema and possible DM wounds. TTE done for CHF workup shows new LV thrombus; Cardiology consulted and Pharmacy to dose IV heparin . Not on anticoagulation PTA.  Baseline CBC WNL.   Significant events:  Today, 05/07/2023: Heparin level is 0.33, remains therapeutic on IV heparin 2100 units/hr   CBC: WNL 12/19 SCr slightly elevated (baseline ~0.9) Per RN no bleeding/bruising or problems with IV heparin infusion  Goal of Therapy: Heparin level 0.3-0.7 units/ml Monitor platelets by anticoagulation protocol: Yes  Plan: Continue  IV heparin at 2100   Daily CBC, daily heparin level  Monitor for signs of bleeding or thrombosis   Adalberto Cole, PharmD, BCPS 05/07/2023 4:27 PM

## 2023-05-07 NOTE — Plan of Care (Signed)
  Problem: Coping: Goal: Ability to adjust to condition or change in health will improve Outcome: Progressing   Problem: Clinical Measurements: Goal: Ability to maintain clinical measurements within normal limits will improve Outcome: Progressing   Problem: Skin Integrity: Goal: Skin integrity will improve Outcome: Progressing

## 2023-05-08 DIAGNOSIS — E785 Hyperlipidemia, unspecified: Secondary | ICD-10-CM | POA: Diagnosis not present

## 2023-05-08 DIAGNOSIS — Z794 Long term (current) use of insulin: Secondary | ICD-10-CM | POA: Diagnosis not present

## 2023-05-08 DIAGNOSIS — I2510 Atherosclerotic heart disease of native coronary artery without angina pectoris: Secondary | ICD-10-CM | POA: Diagnosis not present

## 2023-05-08 DIAGNOSIS — I110 Hypertensive heart disease with heart failure: Secondary | ICD-10-CM | POA: Diagnosis not present

## 2023-05-08 DIAGNOSIS — I513 Intracardiac thrombosis, not elsewhere classified: Secondary | ICD-10-CM | POA: Diagnosis not present

## 2023-05-08 DIAGNOSIS — Z79899 Other long term (current) drug therapy: Secondary | ICD-10-CM | POA: Diagnosis not present

## 2023-05-08 DIAGNOSIS — I255 Ischemic cardiomyopathy: Secondary | ICD-10-CM | POA: Diagnosis not present

## 2023-05-08 DIAGNOSIS — Z7982 Long term (current) use of aspirin: Secondary | ICD-10-CM | POA: Diagnosis not present

## 2023-05-08 DIAGNOSIS — L281 Prurigo nodularis: Secondary | ICD-10-CM | POA: Diagnosis not present

## 2023-05-08 DIAGNOSIS — I509 Heart failure, unspecified: Secondary | ICD-10-CM | POA: Diagnosis not present

## 2023-05-08 DIAGNOSIS — I776 Arteritis, unspecified: Secondary | ICD-10-CM | POA: Diagnosis not present

## 2023-05-08 DIAGNOSIS — Z8701 Personal history of pneumonia (recurrent): Secondary | ICD-10-CM | POA: Diagnosis not present

## 2023-05-08 DIAGNOSIS — I5022 Chronic systolic (congestive) heart failure: Secondary | ICD-10-CM | POA: Diagnosis not present

## 2023-05-08 DIAGNOSIS — E119 Type 2 diabetes mellitus without complications: Secondary | ICD-10-CM | POA: Diagnosis not present

## 2023-05-08 DIAGNOSIS — Z888 Allergy status to other drugs, medicaments and biological substances status: Secondary | ICD-10-CM | POA: Diagnosis not present

## 2023-05-08 DIAGNOSIS — M79662 Pain in left lower leg: Secondary | ICD-10-CM | POA: Diagnosis not present

## 2023-05-08 DIAGNOSIS — Z955 Presence of coronary angioplasty implant and graft: Secondary | ICD-10-CM | POA: Diagnosis not present

## 2023-05-08 DIAGNOSIS — I480 Paroxysmal atrial fibrillation: Secondary | ICD-10-CM | POA: Diagnosis not present

## 2023-05-08 DIAGNOSIS — Z7985 Long-term (current) use of injectable non-insulin antidiabetic drugs: Secondary | ICD-10-CM | POA: Diagnosis not present

## 2023-05-08 DIAGNOSIS — G4733 Obstructive sleep apnea (adult) (pediatric): Secondary | ICD-10-CM | POA: Diagnosis not present

## 2023-05-08 DIAGNOSIS — I252 Old myocardial infarction: Secondary | ICD-10-CM | POA: Diagnosis not present

## 2023-05-08 LAB — BASIC METABOLIC PANEL
Anion gap: 10 (ref 5–15)
BUN: 26 mg/dL — ABNORMAL HIGH (ref 6–20)
CO2: 25 mmol/L (ref 22–32)
Calcium: 8.7 mg/dL — ABNORMAL LOW (ref 8.9–10.3)
Chloride: 100 mmol/L (ref 98–111)
Creatinine, Ser: 1.1 mg/dL (ref 0.61–1.24)
GFR, Estimated: >60 mL/min (ref >60)
Glucose, Bld: 129 mg/dL — ABNORMAL HIGH (ref 70–99)
Potassium: 3.7 mmol/L (ref 3.5–5.1)
Sodium: 135 mmol/L (ref 135–145)

## 2023-05-08 LAB — GLUCOSE, CAPILLARY
Glucose-Capillary: 137 mg/dL — ABNORMAL HIGH (ref 70–99)
Glucose-Capillary: 410 mg/dL — ABNORMAL HIGH (ref 70–99)
Glucose-Capillary: 223 mg/dL — ABNORMAL HIGH (ref 70–99)

## 2023-05-08 LAB — CBC
HCT: 45.6 % (ref 39.0–52.0)
Hemoglobin: 14.5 g/dL (ref 13.0–17.0)
MCH: 27.5 pg (ref 26.0–34.0)
MCHC: 31.8 g/dL (ref 30.0–36.0)
MCV: 86.5 fL (ref 80.0–100.0)
Platelets: 257 10*3/uL (ref 150–400)
RBC: 5.27 MIL/uL (ref 4.22–5.81)
RDW: 14.7 % (ref 11.5–15.5)
WBC: 8.4 10*3/uL (ref 4.0–10.5)
nRBC: 0 % (ref 0.0–0.2)

## 2023-05-08 LAB — HEPARIN LEVEL (UNFRACTIONATED): Heparin Unfractionated: 0.34 [IU]/mL (ref 0.30–0.70)

## 2023-05-08 NOTE — Progress Notes (Signed)
Heart Failure Nurse Navigator Progress Note    Was able to reach patient on his cell phone, Called to interview for HF TOC follow up appointment for a EF of 20%. Patient reported that he has signed himself out of the hospital AMA and was in the car already.   Navigator will sign off at this time.   Rhae Hammock, BSN, Scientist, clinical (histocompatibility and immunogenetics) Only

## 2023-05-08 NOTE — Progress Notes (Signed)
Patient came to the nurses station asking about checking himself out.  This nurse explained that it would be AMA and policy explained.  Paperwork signed and IV was removed.  MD and primary nurse made aware.  Patient left hospital with all belongings.  Levora Angel, RN

## 2023-05-08 NOTE — Progress Notes (Signed)
This RN received call from [facility] stating that transfer request for pt had been denied.

## 2023-05-08 NOTE — Discharge Summary (Signed)
Physician Discharge Summary  Evan [name] GLO:[id number] DOB: August 28, 1965 DOA: 05/04/2023  PCP: Dettinger, Elige Radon, MD  Admit date: 05/04/2023  Discharge date: 05/08/2023  Admitted From: Home Disposition:  Left AMA  Recommendations for Outpatient Follow-up:  Follow up with PCP in 1-2 weeks Please obtain BMP/CBC in one week   Home Health:None Equipment/Devices:None  Discharge Condition: Fair CODE STATUS:Full code Diet recommendation: Heart Healthy   Brief Summary / Hospital Course: This 57 yrs old Male with PMH significant for sCHF/ICM, CAD/PCI, DM-2, polysubstance use, recent acute cholecystitis for which he was treated with antibiotics after he refused HIDA scan and biliary drain sent to ED by PCP due to bilateral lower extremity "wound infection". He reports increased redness, pain and weeping from his legs. He also had dyspnea on exertion and BLE edema for about 2 weeks. He was admitted with working diagnosis of diabetic wound infection and acute on chronic CHF. BNP about 1000. No fever or leukocytosis. Bilateral leg x-ray with subcutaneous edema. Cultures obtained. Started on antibiotics. TTE and ABI ordered. TTE with LVEF of 20%, GH, G3 DD, LV thrombus, moderate MR and RVSP of 59.6 mmHg. Started on IV heparin. Cardiology and ID consulted. Infectious disease thinks it is vasculitis does not seems infected.  Antibiotic discontinued.  Patient was given a dose of steroids. Infectious disease and cardiology recommended patient should be transferred to tertiary care center for further evaluation where inpatient dermatology and rheumatology is available.  Transfer requested for [name] which was denied.  Patient got frustrated with the wait for transfer.  He stated he will sign AMA and if he will go to [facility] ER to be admitted.  Patient was explained the risks associated with AMA.  he understood.   Discharge Diagnoses:  Principal Problem:   Vasculitis (HCC) Active Problems:    Cellulitis in diabetic foot (HCC)   CAD S/P PCI   Acute on chronic HFrEF (heart failure with reduced ejection fraction) (HCC)   History of acute cholecystitis 04/18/23 treated conservatively   Essential hypertension   Diabetic foot ulcer (HCC)   CHF (congestive heart failure) (HCC)   Bilateral leg ulcer (HCC)   Acute on chronic combined systolic and diastolic CHF (congestive heart failure) [facility])  Discharge Instructions Left against medical advice  No Known Allergies  Consultations: Infectious Diseases.   Procedures/Studies: DG Chest Port 1 View Result Date: 05/06/2023 CLINICAL DATA:  Congestive heart failure. EXAM: PORTABLE CHEST 1 VIEW COMPARISON:  04/18/2023 FINDINGS: Unchanged cardiac enlargement. No signs of pleural effusion, interstitial edema or airspace disease. Visualized osseous structures are unremarkable. IMPRESSION: Cardiomegaly. No acute findings. Electronically Signed   By: Signa Kell M.D.   On: 05/06/2023 06:10   VAS [place] LOWER EXTREMITY VENOUS (DVT) Result Date: 05/05/2023  Lower Venous DVT Study Patient Name:  Evan [name]  Date of Exam:   05/05/2023 Medical Rec #: [id number]       Accession #:    [id number] Date of Birth: 1965-11-17       Patient Gender: M Patient Age:   55 years Exam Location:  [facility] Procedure:      VAS [place] LOWER EXTREMITY VENOUS (DVT) Referring Phys: Alwyn Ren GONFA --------------------------------------------------------------------------------  Indications: Elevated Ddimer.  Risk Factors: None identified. Limitations: Open wound. Comparison Study: No prior studies. Performing Technologist: Chanda Busing RVT  Examination Guidelines: A complete evaluation includes B-mode imaging, spectral Doppler, color Doppler, and power Doppler as needed of all accessible portions of each vessel. Bilateral testing is considered an  integral part of a complete examination. Limited examinations for reoccurring indications may be performed as noted. The reflux  portion of the exam is performed with the patient in reverse Trendelenburg.  +---------+---------------+---------+-----------+----------+--------------+ RIGHT    CompressibilityPhasicitySpontaneityPropertiesThrombus Aging +---------+---------------+---------+-----------+----------+--------------+ CFV      Full           Yes      Yes                                 +---------+---------------+---------+-----------+----------+--------------+ SFJ      Full                                                        +---------+---------------+---------+-----------+----------+--------------+ FV Prox  Full                                                        +---------+---------------+---------+-----------+----------+--------------+ FV Mid   Full                                                        +---------+---------------+---------+-----------+----------+--------------+ FV DistalFull                                                        +---------+---------------+---------+-----------+----------+--------------+ PFV      Full                                                        +---------+---------------+---------+-----------+----------+--------------+ POP      Full           Yes      Yes                                 +---------+---------------+---------+-----------+----------+--------------+ PTV      Full                                                        +---------+---------------+---------+-----------+----------+--------------+ PERO     Full                                                        +---------+---------------+---------+-----------+----------+--------------+   +---------+---------------+---------+-----------+----------+--------------+ LEFT     CompressibilityPhasicitySpontaneityPropertiesThrombus Aging +---------+---------------+---------+-----------+----------+--------------+ CFV  Full           Yes      Yes                                  +---------+---------------+---------+-----------+----------+--------------+ SFJ      Full                                                        +---------+---------------+---------+-----------+----------+--------------+ FV Prox  Full                                                        +---------+---------------+---------+-----------+----------+--------------+ FV Mid   Full                                                        +---------+---------------+---------+-----------+----------+--------------+ FV DistalFull                                                        +---------+---------------+---------+-----------+----------+--------------+ PFV      Full                                                        +---------+---------------+---------+-----------+----------+--------------+ POP      Full           Yes      Yes                                 +---------+---------------+---------+-----------+----------+--------------+ PTV      Full                                                        +---------+---------------+---------+-----------+----------+--------------+ PERO     Full                                                        +---------+---------------+---------+-----------+----------+--------------+     Summary: RIGHT: - There is no evidence of deep vein thrombosis in the lower extremity. However, portions of this examination were limited- see technologist comments above.  - No cystic structure found in the popliteal fossa.  LEFT: - There is no evidence of deep vein thrombosis in the lower extremity. However, portions of this examination were  limited- see technologist comments above.  - No cystic structure found in the popliteal fossa.  *See table(s) above for measurements and observations. Electronically signed by Lemar Livings MD on 05/05/2023 at 1:38:32 PM.    Final    VAS [place] ABI WITH/WO TBI Result Date:  05/05/2023  LOWER EXTREMITY DOPPLER STUDY Patient Name:  FAWZI LAVINDER  Date of Exam:   05/05/2023 Medical Rec #: [id number]       Accession #:    [id number] Date of Birth: 22-Oct-1965       Patient Gender: M Patient Age:   41 years Exam Location:  [facility] Procedure:      VAS [place] ABI WITH/WO TBI Referring Phys: ELIZABETH REES --------------------------------------------------------------------------------  Indications: Rest pain, and ulceration. High Risk Factors: Hypertension, hyperlipidemia, Diabetes.  Limitations: Today's exam was limited due to an open wound and involuntary              patient movement. Comparison Study: No prior studies. Performing Technologist: Olen Cordial RVT  Examination Guidelines: A complete evaluation includes at minimum, Doppler waveform signals and systolic blood pressure reading at the level of bilateral brachial, anterior tibial, and posterior tibial arteries, when vessel segments are accessible. Bilateral testing is considered an integral part of a complete examination. Photoelectric Plethysmograph (PPG) waveforms and toe systolic pressure readings are included as required and additional duplex testing as needed. Limited examinations for reoccurring indications may be performed as noted.  ABI Findings: +---------+------------------+-----+-----------+--------+ Right    Rt Pressure (mmHg)IndexWaveform   Comment  +---------+------------------+-----+-----------+--------+ Brachial 95                     triphasic           +---------+------------------+-----+-----------+--------+ PTA      119               1.10 multiphasic         +---------+------------------+-----+-----------+--------+ DP       109               1.01 multiphasic         +---------+------------------+-----+-----------+--------+ Great Toe70                0.65                     +---------+------------------+-----+-----------+--------+  +---------+------------------+-----+-----------+-------+ Left     Lt Pressure (mmHg)IndexWaveform   Comment +---------+------------------+-----+-----------+-------+ Brachial 108                    triphasic          +---------+------------------+-----+-----------+-------+ PTA      112               1.04 multiphasic        +---------+------------------+-----+-----------+-------+ DP       241               2.23 multiphasic        +---------+------------------+-----+-----------+-------+ Great Toe83                0.77                    +---------+------------------+-----+-----------+-------+ +-------+-----------+-----------+------------+------------+ ABI/TBIToday's ABIToday's TBIPrevious ABIPrevious TBI +-------+-----------+-----------+------------+------------+ Right  1.1        0.65                                +-------+-----------+-----------+------------+------------+  Left   [place]         0.77                                +-------+-----------+-----------+------------+------------+  Summary: Right: Resting right ankle-brachial index is within normal range. The right toe-brachial index is abnormal. Left: Resting left ankle-brachial index indicates noncompressible left lower extremity arteries. The left toe-brachial index is normal. *See table(s) above for measurements and observations.  Electronically signed by Lemar Livings MD on 05/05/2023 at 1:38:24 PM.    Final    ECHOCARDIOGRAM COMPLETE Result Date: 05/05/2023    ECHOCARDIOGRAM REPORT   Patient Name:   RANSOME BENEDIX Date of Exam: 05/05/2023 Medical Rec #:  [id number]      Height:       77.0 in Accession #:    [id number]     Weight:       260.0 lb Date of Birth:  [birth year]      BSA:          2.501 m Patient Age:    57 years       BP:           106/79 mmHg Patient Gender: M              HR:           100 bpm. Exam Location:  Inpatient Procedure: 2D Echo, Cardiac Doppler, Color Doppler and Intracardiac             Opacification Agent Indications:    CHF  History:        Patient has prior history of Echocardiogram examinations, most                 recent 08/23/2020. Cardiomyopathy, Previous Myocardial Infarction,                 Arrythmias:Atrial Fibrillation; Risk Factors:Diabetes,                 Hypertension and Sleep Apnea.  Sonographer:    Darlys Gales Referring Phys: [id number] HAZEL V DUNCAN IMPRESSIONS  1. Left ventricular ejection fraction, by estimation, is 20%. The left ventricle has severely decreased function. The left ventricle demonstrates global hypokinesis. The left ventricular internal cavity size was mildly dilated. Left ventricular diastolic parameters are consistent with Grade III diastolic dysfunction (restrictive). There was a thrombus at the LV apex.  2. Right ventricular systolic function is moderately reduced. The right ventricular size is mildly enlarged. There is moderately elevated pulmonary artery systolic pressure. The estimated right ventricular systolic pressure is 59.6 mmHg.  3. Left atrial size was moderately dilated.  4. Right atrial size was mildly dilated.  5. The mitral valve is abnormal. Moderate mitral valve regurgitation. No evidence of mitral stenosis.  6. The aortic valve is tricuspid. There is mild calcification of the aortic valve. Aortic valve regurgitation is not visualized. No aortic stenosis is present.  7. The inferior vena cava is dilated in size with <50% respiratory variability, suggesting right atrial pressure of 15 mmHg. FINDINGS  Left Ventricle: Left ventricular ejection fraction, by estimation, is 20%. The left ventricle has severely decreased function. The left ventricle demonstrates global hypokinesis. The left ventricular internal cavity size was mildly dilated. There is no left ventricular hypertrophy. Left ventricular diastolic parameters are consistent with Grade III diastolic dysfunction (restrictive). Right Ventricle: The right ventricular size is mildly enlarged.  No increase in right ventricular wall  thickness. Right ventricular systolic function is moderately reduced. There is moderately elevated pulmonary artery systolic pressure. The tricuspid regurgitant velocity is 3.34 m/s, and with an assumed right atrial pressure of 15 mmHg, the estimated right ventricular systolic pressure is 59.6 mmHg. Left Atrium: Left atrial size was moderately dilated. Right Atrium: Right atrial size was mildly dilated. Pericardium: Trivial pericardial effusion is present. Mitral Valve: The mitral valve is abnormal. Mild mitral annular calcification. Moderate mitral valve regurgitation. No evidence of mitral valve stenosis. Tricuspid Valve: The tricuspid valve is normal in structure. Tricuspid valve regurgitation is mild. Aortic Valve: The aortic valve is tricuspid. There is mild calcification of the aortic valve. Aortic valve regurgitation is not visualized. No aortic stenosis is present. Aortic valve mean gradient measures 3.0 mmHg. Aortic valve peak gradient measures 4.5 mmHg. Aortic valve area, by VTI measures 2.32 cm. Pulmonic Valve: The pulmonic valve was normal in structure. Pulmonic valve regurgitation is not visualized. Aorta: The aortic root is normal in size and structure. Venous: The inferior vena cava is dilated in size with less than 50% respiratory variability, suggesting right atrial pressure of 15 mmHg. IAS/Shunts: No atrial level shunt detected by color flow Doppler.  LEFT VENTRICLE PLAX 2D LVIDd:         6.20 cm LVIDs:         5.80 cm LV PW:         0.90 cm LV IVS:        0.60 cm LVOT diam:     2.10 cm LV SV:         33 LV SV Index:   13 LVOT Area:     3.46 cm  RIGHT VENTRICLE RV S prime:     10.30 cm/s TAPSE (M-mode): 0.8 cm LEFT ATRIUM              Index        RIGHT ATRIUM           Index LA Vol (A2C):   155.0 ml 61.98 ml/m  RA Area:     22.10 cm LA Vol (A4C):   117.0 ml 46.79 ml/m  RA Volume:   71.30 ml  28.51 ml/m LA Biplane Vol: 135.0 ml 53.99 ml/m  AORTIC VALVE  AV Area (Vmax):    2.05 cm AV Area (Vmean):   2.03 cm AV Area (VTI):     2.32 cm AV Vmax:           106.00 cm/s AV Vmean:          75.700 cm/s AV VTI:            0.143 m AV Peak Grad:      4.5 mmHg AV Mean Grad:      3.0 mmHg LVOT Vmax:         62.70 cm/s LVOT Vmean:        44.400 cm/s LVOT VTI:          0.096 m LVOT/AV VTI ratio: 0.67  AORTA Ao Root diam: 3.20 cm MITRAL VALVE                TRICUSPID VALVE MV Area (PHT): 6.17 cm     TR Peak grad:   44.6 mmHg MV Decel Time: 123 msec     TR Vmax:        334.00 cm/s MV E velocity: 110.00 cm/s  SHUNTS                             Systemic VTI:  0.10 m                             Systemic Diam: 2.10 cm Dalton McleanMD Electronically signed by Wilfred Lacy Signature Date/Time: 05/05/2023/11:36:02 AM    Final    DG Tibia/Fibula Right Result Date: 05/04/2023 CLINICAL DATA:  Diabetic wounds.  Pain and redness. EXAM: RIGHT TIBIA AND FIBULA - 2 VIEW COMPARISON:  None Available. FINDINGS: There is no evidence of fracture or other focal bone lesions. No erosive change or periostitis. Degenerative change of the knee and ankle. Chondrocalcinosis of the knee. Areas of subcutaneous edema, no soft tissue gas or radiopaque foreign body. IMPRESSION: 1. Subcutaneous edema. No soft tissue gas or radiopaque foreign body. 2. No radiographic evidence of osteomyelitis. 3. Degenerative change of the knee and ankle. Electronically Signed   By: Narda Rutherford M.D.   On: 05/04/2023 21:03   DG Tibia/Fibula Left Result Date: 05/04/2023 CLINICAL DATA:  Bilateral lower extremity diabetic wounds EXAM: LEFT TIBIA AND FIBULA - 2 VIEW COMPARISON:  None Available. FINDINGS: Normal alignment. No acute fracture or dislocation. Degenerative changes are noted within the left knee and left ankle. No osseous erosions or abnormal periosteal reaction. Mild vascular calcification noted. IMPRESSION: 1. Degenerative changes. No radiographic evidence of osteomyelitis.  Electronically Signed   By: Helyn Numbers M.D.   On: 05/04/2023 21:02   US Abdomen Limited Result Date: 04/19/2023 CLINICAL DATA:  [id number] Abdominal pain [id number] EXAM: ULTRASOUND ABDOMEN LIMITED RIGHT UPPER QUADRANT COMPARISON:  CT 04/18/2023 FINDINGS: Gallbladder: Gallbladder wall abnormally thickened measuring 10 mm. Adjacent pericholecystic fluid. Small amount of layering sludge. No shadowing stones. No sonographic Murphy sign noted by sonographer. Common bile duct: Diameter: 3 mm. Liver: No focal lesion identified. Within normal limits in parenchymal echogenicity. Portal vein is patent on color Doppler imaging with normal direction of blood flow towards the liver. Other: None. IMPRESSION: Abnormal gallbladder wall thickening with pericholecystic fluid and sludge. No shadowing stones. Findings are equivocal for acute cholecystitis. If further evaluation is needed, consider nuclear medicine HIDA scan. Electronically Signed   By: Duanne Guess D.O.   On: 04/19/2023 10:53   CT Angio Chest PE W and/or Wo Contrast Result Date: 04/18/2023 CLINICAL DATA:  Pulmonary embolus suspected with low to intermediate probability. Positive D-dimer. Near syncope, dizziness, and shortness of breath. Nonproductive cough. Acute nonlocalized abdominal pain. EXAM: CT ANGIOGRAPHY CHEST CT ABDOMEN AND PELVIS WITH CONTRAST TECHNIQUE: Multidetector CT imaging of the chest was performed using the standard protocol during bolus administration of intravenous contrast. Multiplanar CT image reconstructions and MIPs were obtained to evaluate the vascular anatomy. Multidetector CT imaging of the abdomen and pelvis was performed using the standard protocol during bolus administration of intravenous contrast. RADIATION DOSE REDUCTION: This exam was performed according to the departmental dose-optimization program which includes automated exposure control, adjustment of the mA and/or kV according to patient size and/or use of iterative  reconstruction technique. CONTRAST:  OMNIPAQUE IOHEXOL 350 MG/ML SOLN COMPARISON:  CT chest 03/23/2023. FINDINGS: CTA CHEST FINDINGS Cardiovascular: Good opacification of the central and segmental pulmonary arteries. No focal filling defects. No evidence of significant pulmonary embolus. Normal caliber thoracic aorta. Calcification in the coronary arteries. Coronary stents. Mild calcification in the thoracic aorta. Mediastinum/Nodes: No enlarged mediastinal, hilar,  or axillary lymph nodes. Thyroid gland, trachea, and esophagus demonstrate no significant findings. Lungs/Pleura: Mild interstitial changes to the lungs may indicate chronic fibrosis or acute edema. No pleural effusions. No pneumothorax. No focal consolidation. Musculoskeletal: No chest wall abnormality. No acute or significant osseous findings. Review of the MIP images confirms the above findings. CT ABDOMEN and PELVIS FINDINGS Hepatobiliary: Gallbladder wall is thickened with pericholecystic edema and stranding. No stones are identified. Changes may represent acute cholecystitis, either acalculous or with non radiopaque stones. No bile duct dilatation. No focal liver lesions. Pancreas: Unremarkable. No pancreatic ductal dilatation or surrounding inflammatory changes. Spleen: Normal in size without focal abnormality. Adrenals/Urinary Tract: Adrenal glands are unremarkable. Kidneys are normal, without renal calculi, focal lesion, or hydronephrosis. Bladder is unremarkable. Stomach/Bowel: Stomach is within normal limits. Appendix appears normal. No evidence of bowel wall thickening, distention, or inflammatory changes. Vascular/Lymphatic: Aortic atherosclerosis. No enlarged abdominal or pelvic lymph nodes. Reproductive: Prostate is unremarkable. Other: Small amount of free fluid around the liver, likely reactive. No free air. Abdominal wall musculature appears intact. Musculoskeletal: No acute or significant osseous findings. Review of the MIP images  confirms the above findings. IMPRESSION: 1. No evidence of significant pulmonary embolus. 2. Mild interstitial changes in the lungs, possibly chronic fibrosis or acute edema. No focal consolidation. 3. Gallbladder wall thickening with pericholecystic edema, likely cholecystitis. No discrete stones are identified. 4. Aortic atherosclerosis. 5. Small amount of free fluid around the liver is likely reactive. Electronically Signed   By: Burman Nieves M.D.   On: 04/18/2023 22:29   CT ABDOMEN PELVIS W CONTRAST Result Date: 04/18/2023 CLINICAL DATA:  Pulmonary embolus suspected with low to intermediate probability. Positive D-dimer. Near syncope, dizziness, and shortness of breath. Nonproductive cough. Acute nonlocalized abdominal pain. EXAM: CT ANGIOGRAPHY CHEST CT ABDOMEN AND PELVIS WITH CONTRAST TECHNIQUE: Multidetector CT imaging of the chest was performed using the standard protocol during bolus administration of intravenous contrast. Multiplanar CT image reconstructions and MIPs were obtained to evaluate the vascular anatomy. Multidetector CT imaging of the abdomen and pelvis was performed using the standard protocol during bolus administration of intravenous contrast. RADIATION DOSE REDUCTION: This exam was performed according to the departmental dose-optimization program which includes automated exposure control, adjustment of the mA and/or kV according to patient size and/or use of iterative reconstruction technique. CONTRAST:  OMNIPAQUE IOHEXOL 350 MG/ML SOLN COMPARISON:  CT chest 03/23/2023. FINDINGS: CTA CHEST FINDINGS Cardiovascular: Good opacification of the central and segmental pulmonary arteries. No focal filling defects. No evidence of significant pulmonary embolus. Normal caliber thoracic aorta. Calcification in the coronary arteries. Coronary stents. Mild calcification in the thoracic aorta. Mediastinum/Nodes: No enlarged mediastinal, hilar, or axillary lymph nodes. Thyroid gland, trachea,  and esophagus demonstrate no significant findings. Lungs/Pleura: Mild interstitial changes to the lungs may indicate chronic fibrosis or acute edema. No pleural effusions. No pneumothorax. No focal consolidation. Musculoskeletal: No chest wall abnormality. No acute or significant osseous findings. Review of the MIP images confirms the above findings. CT ABDOMEN and PELVIS FINDINGS Hepatobiliary: Gallbladder wall is thickened with pericholecystic edema and stranding. No stones are identified. Changes may represent acute cholecystitis, either acalculous or with non radiopaque stones. No bile duct dilatation. No focal liver lesions. Pancreas: Unremarkable. No pancreatic ductal dilatation or surrounding inflammatory changes. Spleen: Normal in size without focal abnormality. Adrenals/Urinary Tract: Adrenal glands are unremarkable. Kidneys are normal, without renal calculi, focal lesion, or hydronephrosis. Bladder is unremarkable. Stomach/Bowel: Stomach is within normal limits. Appendix appears normal. No evidence of  bowel wall thickening, distention, or inflammatory changes. Vascular/Lymphatic: Aortic atherosclerosis. No enlarged abdominal or pelvic lymph nodes. Reproductive: Prostate is unremarkable. Other: Small amount of free fluid around the liver, likely reactive. No free air. Abdominal wall musculature appears intact. Musculoskeletal: No acute or significant osseous findings. Review of the MIP images confirms the above findings. IMPRESSION: 1. No evidence of significant pulmonary embolus. 2. Mild interstitial changes in the lungs, possibly chronic fibrosis or acute edema. No focal consolidation. 3. Gallbladder wall thickening with pericholecystic edema, likely cholecystitis. No discrete stones are identified. 4. Aortic atherosclerosis. 5. Small amount of free fluid around the liver is likely reactive. Electronically Signed   By: Burman Nieves M.D.   On: 04/18/2023 22:29   DG Chest Portable 1 View Result Date:  04/18/2023 CLINICAL DATA:  Near-syncope.  Shortness of breath EXAM: PORTABLE CHEST 1 VIEW COMPARISON:  X-ray and CT angiogram 03/23/2023. FINDINGS: Stable enlarged cardiopericardial silhouette with some vascular congestion. No consolidation, pneumothorax or effusion. No edema. Overlapping cardiac leads. The right inferior costophrenic angle is clipped off the edge of the film. IMPRESSION: No significant interval change. Electronically Signed   By: Karen Kays M.D.   On: 04/18/2023 20:52     Subjective: Left AGAINST MEDICAL ADVICE  Discharge Exam: Vitals:   05/07/23 1933 05/08/23 0352  BP: (!) 113/98 (!) 125/95  Pulse: (!) 105 64  Resp: 20 20  Temp: 98 F (36.7 C) 98.6 F (37 C)  SpO2: 94% 98%   Vitals:   05/07/23 0531 05/07/23 1303 05/07/23 1933 05/08/23 0352  BP: (!) 141/84 103/84 (!) 113/98 (!) 125/95  Pulse: 100 89 (!) 105 64  Resp: 20 16 20 20   Temp: 97.8 F (36.6 C) 97.7 F (36.5 C) 98 F (36.7 C) 98.6 F (37 C)  TempSrc: Oral Oral    SpO2: 99%  94% 98%  Weight:    115.5 kg  Height:        General: Pt is alert, awake, not in acute distress Cardiovascular: RRR, S1/S2 +, no rubs, no gallops Respiratory: CTA bilaterally, no wheezing, no rhonchi Abdominal: Soft, NT, ND, bowel sounds + Extremities: no edema, no cyanosis    The results of significant diagnostics from this hospitalization (including imaging, microbiology, ancillary and laboratory) are listed below for reference.     Microbiology: Recent Results (from the past 240 hours)  Blood Cultures x 2 sites     Status: None (Preliminary result)   Collection Time: 05/05/23 12:05 AM   Specimen: BLOOD  Result Value Ref Range Status   Specimen Description   Final    BLOOD LEFT ANTECUBITAL Performed at [facility], [address] [place], [place] [address]    Special Requests   Final    BOTTLES DRAWN AEROBIC AND ANAEROBIC Blood Culture adequate volume Performed at [facility], [address] [place], [place] [address]    Culture   Final    NO GROWTH 3 DAYS Performed at [facility], [address] [place], [place] [address]    Report Status PENDING  Incomplete  Blood Cultures x 2 sites     Status: None (Preliminary result)   Collection Time: 05/05/23 12:17 AM   Specimen: BLOOD  Result Value Ref Range Status   Specimen Description   Final    BLOOD RIGHT ANTECUBITAL Performed at [facility], [address], [place], [place] [address]    Special Requests   Final    BOTTLES DRAWN  AEROBIC AND ANAEROBIC Blood Culture adequate volume Performed at [facility], [address] [place], [place] [address]    Culture   Final    NO GROWTH 3 DAYS Performed at [facility], [address], [place], [place] [address]    Report Status PENDING  Incomplete     Labs: BNP (last 3 results) Recent Labs    03/23/23 1145 04/18/23 2030 05/05/23 0125  BNP 880.0* 1,039.0* 1,045.5*   Basic Metabolic Panel: Recent Labs  Lab 05/04/23 1927 05/06/23 0530 05/06/23 1537 05/07/23 0434 05/08/23 0516  NA 132* 134* 132* 135 135  K 3.7 3.0* 3.9 3.1* 3.7  CL 101 101 100 99 100  CO2 20* 25 22 23 25   GLUCOSE 182* 135* 171* 134* 129*  BUN 20 24* 24* 28* 26*  CREATININE 1.22 1.16 1.15 1.17 1.10  CALCIUM 8.5* 8.1* 8.5* 8.8* 8.7*  MG  --  2.0  --  2.1  --   PHOS  --  3.6  --  4.0  --    Liver Function Tests: Recent Labs  Lab 05/04/23 1927 05/06/23 0530  AST 33  --   ALT 28  --   ALKPHOS 79  --   BILITOT 1.0  --   PROT 7.1  --   ALBUMIN 3.4* 3.0*   No results for input(s): "LIPASE", "AMYLASE" in the last 168 hours. No results for input(s): "AMMONIA" in the last 168 hours. CBC: Recent Labs  Lab 05/04/23 1927 05/06/23 0530 05/07/23 0434 05/08/23 0516  WBC 10.3 8.6 8.6 8.4  NEUTROABS 7.1  --   --   --   HGB 15.2 14.7 15.4 14.5  HCT 46.3 44.9 48.2 45.6  MCV 86.9 87.4 86.8 86.5  PLT 312 270 303 257    Cardiac Enzymes: No results for input(s): "CKTOTAL", "CKMB", "CKMBINDEX", "TROPONINI" in the last 168 hours. BNP: Invalid input(s): "POCBNP" CBG: Recent Labs  Lab 05/07/23 2014 05/07/23 2345 05/08/23 0348 05/08/23 0738 05/08/23 0849  GLUCAP 173* 162* 137* 410* 223*   D-Dimer No results for input(s): "DDIMER" in the last 72 hours. Hgb A1c No results for input(s): "HGBA1C" in the last 72 hours. Lipid Profile No results for input(s): "CHOL", "HDL", "LDLCALC", "TRIG", "CHOLHDL", "LDLDIRECT" in the last 72 hours. Thyroid function studies No results for input(s): "TSH", "T4TOTAL", "T3FREE", "THYROIDAB" in the last 72 hours.  Invalid input(s): "FREET3" Anemia work up No results for input(s): "VITAMINB12", "FOLATE", "FERRITIN", "TIBC", "IRON", "RETICCTPCT" in the last 72 hours. Urinalysis    Component Value Date/Time   COLORURINE YELLOW 04/19/2023 0840   APPEARANCEUR CLEAR 04/19/2023 0840   APPEARANCEUR Clear 10/20/2022 1623   LABSPEC >1.046 (H) 04/19/2023 0840   PHURINE 5.0 04/19/2023 0840   GLUCOSEU >=500 (A) 04/19/2023 0840   HGBUR SMALL (A) 04/19/2023 0840   BILIRUBINUR NEGATIVE 04/19/2023 0840   BILIRUBINUR Negative 10/20/2022 1623   KETONESUR 5 (A) 04/19/2023 0840   PROTEINUR 30 (A) 04/19/2023 0840   NITRITE NEGATIVE 04/19/2023 0840   LEUKOCYTESUR NEGATIVE 04/19/2023 0840   Sepsis Labs Recent Labs  Lab 05/04/23 1927 05/06/23 0530 05/07/23 0434 05/08/23 0516  WBC 10.3 8.6 8.6 8.4   Microbiology Recent Results (from the past 240 hours)  Blood Cultures x 2 sites     Status: None (Preliminary result)   Collection Time: 05/05/23 12:05 AM   Specimen: BLOOD  Result Value Ref Range Status   Specimen Description   Final    BLOOD LEFT ANTECUBITAL Performed at [facility], [address], [place], [place] [address]    Special Requests   Final    BOTTLES DRAWN AEROBIC AND ANAEROBIC Blood Culture adequate volume Performed at [facility], [address] [place], [place] [address]    Culture   Final    NO GROWTH 3 DAYS Performed at [facility], [address], [place], [place] [address]    Report Status PENDING  Incomplete  Blood Cultures x 2 sites     Status: None (Preliminary result)   Collection Time: 05/05/23 12:17 AM   Specimen: BLOOD  Result Value Ref Range Status   Specimen Description   Final    BLOOD RIGHT ANTECUBITAL Performed at [facility], [address] [place], [place] [address]    Special Requests   Final    BOTTLES DRAWN AEROBIC AND ANAEROBIC Blood Culture adequate volume Performed at [facility], [address], [place], [place] [address]    Culture   Final    NO GROWTH 3 DAYS Performed at [facility], [address] [place], [place] [address]    Report Status PENDING  Incomplete     Time coordinating discharge: Over 30 minutes  SIGNED:   Willeen Niece, MD  [facility] 05/08/2023, 1:20 PM Pager   If 7PM-7AM, please contact night-coverage

## 2023-05-08 NOTE — Progress Notes (Signed)
PROGRESS NOTE    Evan [name]  YNW:[id number] DOB: 1965-12-11 DOA: 05/04/2023 PCP: Dettinger, Elige Radon, MD   Brief Narrative:  This 57 yrs old Male with PMH significant for sCHF/ICM, CAD/PCI, DM-2, polysubstance use, recent acute cholecystitis for which he was treated with antibiotics after he refused HIDA scan and biliary drain sent to ED by PCP due to bilateral lower extremity "wound infection".  He reports increased redness, pain and weeping from his legs.  He also had dyspnea on exertion and BLE edema for about 2 weeks.  He was admitted with working diagnosis of diabetic wound infection and acute on chronic CHF.  BNP about 1000.  No fever or leukocytosis.  Bilateral leg x-ray with subcutaneous edema.  Cultures obtained.  Started on antibiotics.  TTE and ABI ordered.   TTE with LVEF of 20%, GH, G3 DD, LV thrombus, moderate MR and RVSP of 59.6 mmHg.  Started on IV heparin.  Cardiology and ID consulted.  Assessment & Plan:   Principal Problem:   Vasculitis (HCC) Active Problems:   Cellulitis in diabetic foot (HCC)   CAD S/P PCI   Acute on chronic HFrEF (heart failure with reduced ejection fraction) (HCC)   History of acute cholecystitis 04/18/23 treated conservatively   Essential hypertension   Diabetic foot ulcer (HCC)   CHF (congestive heart failure) (HCC)   Bilateral leg ulcer (HCC)   Acute on chronic combined systolic and diastolic CHF (congestive heart failure) (HCC)  Bilateral LE / Hand ulceration/ infection,  Vasculitic skin lesions: Patient reports vasculitis skin lesions for 5-6 weeks. Now has purulent drainage.    History of cocaine use but patient denies substance use or IVDU other than occasional marijuana.   CRP 2.5.  ESR 1.0.  s/p vancomyicin and cefepime. ID has discontinued all abx since 05/05/2023 Blood cultures has been negative so far. Although right toe  ABI is abnormal. Lesion consistent with major vasulcar insuficiency. No DVT Wound care consulted.  ID thinks  could be cocaine or levamisole induced vasculitis. Punch biopsy done by ID on 12/18 - awaiting results. Acute hepatitis panel in wnl Pending anca, cardiolipin, beta-2-glycoprotein, ana, RF factor, cryoglobulins. ID suggested transfer to tertiary care unit where dermatology and rheumatology expertise is available. [facility] transfer line contacted.  They are at bed capacity. Patient is frustrated with the wait, states he wants to sign AMA. Explained to the patient his risk associated with signing AMA. Patient is said he is going directly to the [facility] ER.     Acute on chronic systolic CHF /BiV HF/LV thrombus:  TTE with LVEF of 20%, GH, G3 DD, LV thrombus, moderate MR and RVSP of 59.6 mmHg.   TTE finding worse compared to his TTE in 2022.  Had DOE, significant edema.  BNP elevated to 1000.  -Continue IV Lasix 40 mg twice daily, monitor weight and I/o -Deferred metoprolol as UDS + ve for cocaine. -Continue home Aldactone and Jardiance -Hold losartan for now -Continue IV heparin for LV thrombus -Cardiology consulted, eval appreciated.   LV thrombus -incidentally discovered during echo for fluid overload, started on IV heparin -Heparin switched to Eliquis upon discharge.   NIDDM-2 with hyperglycemia: A1c 7.3% on 12/1. Last Labs           Recent Labs  Lab 05/05/23 0521 05/05/23 0754 05/05/23 1147  GLUCAP 200* 159* 114*    -Continue current insulin regimen   History of CAD/PCI: No anginal symptoms. -Continue aspirin and Lipitor   History of acute cholecystitis  04/18/23 treated conservatively with antibiotics.  No GI symptoms.   Essential hypertension: Normotensive but soft. -Hold losartan metoprolol   Elevated D-dimer: Mild.  Likely due to LV thrombus. -Check lower extremity venous Doppler -IV heparin for LV thrombus   Tachycardia - POA. sinus   Polysubstance use - including cocaine. However, patient denies iv drug use.     DVT prophylaxis: Heparin Code Status: Full  code Family Communication: No family at bed side Disposition Plan:   Status is: Inpatient Remains inpatient appropriate because: Admitted for cocaine induced vasculitis.     Consultants:  Cardiology Infectious Diseases.  Procedures:  Antimicrobials:  Anti-infectives (From admission, onward)    Start     Dose/Rate Route Frequency Ordered Stop   05/05/23 2200  Vancomycin (VANCOCIN) 1,500 mg in sodium chloride 0.9 % 500 mL IVPB  Status:  Discontinued        1,500 mg 250 mL/hr over 120 Minutes Intravenous 2 times daily 05/05/23 1120 05/05/23 1625   05/05/23 1130  vancomycin (VANCOREADY) IVPB 2000 mg/400 mL        2,000 mg 200 mL/hr over 120 Minutes Intravenous  Once 05/05/23 1120 05/05/23 1432   05/05/23 0930  ceFEPIme (MAXIPIME) 2 g in sodium chloride 0.9 % 100 mL IVPB  Status:  Discontinued        2 g 200 mL/hr over 30 Minutes Intravenous Every 8 hours 05/05/23 0759 05/05/23 1625   05/05/23 0115  ceFEPIme (MAXIPIME) 2 g in sodium chloride 0.9 % 100 mL IVPB        2 g 200 mL/hr over 30 Minutes Intravenous  Once 05/05/23 0106 05/05/23 0203   05/05/23 0100  metroNIDAZOLE (FLAGYL) tablet 500 mg  Status:  Discontinued        500 mg Oral Every 12 hours 05/05/23 0057 05/05/23 1625      Subjective: Patient was seen and examined at bedside.  Overnight events noted.   Patient was sitting comfortably on the edge of the bed, stated he is frustrated with her wait to be transferred to the [facility] or tertiary care hospital. He states he is going directly to [facility] ER once he leaves here.   Objective: Vitals:   05/07/23 0531 05/07/23 1303 05/07/23 1933 05/08/23 0352  BP: (!) 141/84 103/84 (!) 113/98 (!) 125/95  Pulse: 100 89 (!) 105 64  Resp: 20 16 20 20   Temp: 97.8 F (36.6 C) 97.7 F (36.5 C) 98 F (36.7 C) 98.6 F (37 C)  TempSrc: Oral Oral    SpO2: 99%  94% 98%  Weight:    115.5 kg  Height:        Intake/Output Summary (Last 24 hours) at 05/08/2023 1145 Last data filed  at 05/08/2023 0900 Gross per 24 hour  Intake 988.6 ml  Output 2400 ml  Net -1411.4 ml   Filed Weights   05/06/23 1359 05/07/23 0500 05/08/23 0352  Weight: 114.4 kg 111.9 kg 115.5 kg    Examination:  General exam: Appears calm and comfortable, not in any acute distress Respiratory system: CTA bilaterally. Respiratory effort normal.  RR 16 Cardiovascular system: S1 & S2 heard, RRR. No JVD, murmurs, rubs, gallops or clicks. Gastrointestinal system: Abdomen is non distended, soft and non tender. Normal bowel sounds heard. Central nervous system: Alert and oriented x 3. No focal neurological deficits. Extremities: Edema+, patient has ulcers on the legs both thighs seems chronic appearing. Skin: No rashes, lesions or ulcers Psychiatry: Judgement and insight appear normal. Mood & affect appropriate.  Data Reviewed: I have personally reviewed following labs and imaging studies  CBC: Recent Labs  Lab 05/04/23 1927 05/06/23 0530 05/07/23 0434 05/08/23 0516  WBC 10.3 8.6 8.6 8.4  NEUTROABS 7.1  --   --   --   HGB 15.2 14.7 15.4 14.5  HCT 46.3 44.9 48.2 45.6  MCV 86.9 87.4 86.8 86.5  PLT 312 270 303 257   Basic Metabolic Panel: Recent Labs  Lab 05/04/23 1927 05/06/23 0530 05/06/23 1537 05/07/23 0434 05/08/23 0516  NA 132* 134* 132* 135 135  K 3.7 3.0* 3.9 3.1* 3.7  CL 101 101 100 99 100  CO2 20* 25 22 23 25   GLUCOSE 182* 135* 171* 134* 129*  BUN 20 24* 24* 28* 26*  CREATININE 1.22 1.16 1.15 1.17 1.10  CALCIUM 8.5* 8.1* 8.5* 8.8* 8.7*  MG  --  2.0  --  2.1  --   PHOS  --  3.6  --  4.0  --    GFR: Estimated Creatinine Clearance: 104.5 mL/min (by C-G formula based on SCr of 1.1 mg/dL). Liver Function Tests: Recent Labs  Lab 05/04/23 1927 05/06/23 0530  AST 33  --   ALT 28  --   ALKPHOS 79  --   BILITOT 1.0  --   PROT 7.1  --   ALBUMIN 3.4* 3.0*   No results for input(s): "LIPASE", "AMYLASE" in the last 168 hours. No results for input(s): "AMMONIA" in the  last 168 hours. Coagulation Profile: No results for input(s): "INR", "PROTIME" in the last 168 hours. Cardiac Enzymes: No results for input(s): "CKTOTAL", "CKMB", "CKMBINDEX", "TROPONINI" in the last 168 hours. BNP (last 3 results) No results for input(s): "PROBNP" in the last 8760 hours. HbA1C: No results for input(s): "HGBA1C" in the last 72 hours. CBG: Recent Labs  Lab 05/07/23 2014 05/07/23 2345 05/08/23 0348 05/08/23 0738 05/08/23 0849  GLUCAP 173* 162* 137* 410* 223*   Lipid Profile: No results for input(s): "CHOL", "HDL", "LDLCALC", "TRIG", "CHOLHDL", "LDLDIRECT" in the last 72 hours. Thyroid Function Tests: No results for input(s): "TSH", "T4TOTAL", "FREET4", "T3FREE", "THYROIDAB" in the last 72 hours. Anemia Panel: No results for input(s): "VITAMINB12", "FOLATE", "FERRITIN", "TIBC", "IRON", "RETICCTPCT" in the last 72 hours. Sepsis Labs: Recent Labs  Lab 05/04/23 2324  LATICACIDVEN 1.7    Recent Results (from the past 240 hours)  Blood Cultures x 2 sites     Status: None (Preliminary result)   Collection Time: 05/05/23 12:05 AM   Specimen: BLOOD  Result Value Ref Range Status   Specimen Description   Final    BLOOD LEFT ANTECUBITAL Performed at [facility], [address], [place], [place] [address]    Special Requests   Final    BOTTLES DRAWN AEROBIC AND ANAEROBIC Blood Culture adequate volume Performed at [facility], [address], [place], [place] [address]    Culture   Final    NO GROWTH 3 DAYS Performed at [facility], [address], [place], [place] [address]    Report Status PENDING  Incomplete  Blood Cultures x 2 sites     Status: None (Preliminary result)   Collection Time: 05/05/23 12:17 AM   Specimen: BLOOD  Result Value Ref Range Status   Specimen Description   Final    BLOOD RIGHT ANTECUBITAL Performed at [facility], [address] [place], [place] [address]     Special Requests   Final    BOTTLES DRAWN AEROBIC  AND ANAEROBIC Blood Culture adequate volume Performed at [facility], [address], [place], [place] [address]    Culture   Final    NO GROWTH 3 DAYS Performed at [facility], [address] [place], [place] [address]    Report Status PENDING  Incomplete    Radiology Studies: No results found.  Scheduled Meds:  aspirin EC  81 mg Oral Q breakfast   atorvastatin  80 mg Oral Daily   empagliflozin  25 mg Oral Daily   famotidine  20 mg Oral BID   fenofibrate  54 mg Oral Daily   furosemide  80 mg Intravenous Q12H   insulin aspart  0-15 Units Subcutaneous Q4H   mupirocin cream   Topical BID   spironolactone  12.5 mg Oral Daily   Continuous Infusions:  heparin Stopped (05/08/23 0953)     LOS: 3 days    Time spent: 35 mins    Willeen Niece, MD [facility]   If 7PM-7AM, please contact night-coverage

## 2023-05-08 NOTE — Plan of Care (Signed)
  Problem: Fluid Volume: Goal: Ability to maintain a balanced intake and output will improve Outcome: Progressing   Problem: Health Behavior/Discharge Planning: Goal: Ability to identify and utilize available resources and services will improve Outcome: Progressing   Problem: Metabolic: Goal: Ability to maintain appropriate glucose levels will improve Outcome: Progressing   Problem: Nutritional: Goal: Progress toward achieving an optimal weight will improve Outcome: Progressing   Problem: Clinical Measurements: Goal: Ability to maintain clinical measurements within normal limits will improve Outcome: Progressing   Problem: Nutrition: Goal: Adequate nutrition will be maintained Outcome: Progressing   Problem: Pain Management: Goal: General experience of comfort will improve Outcome: Progressing   Problem: Safety: Goal: Ability to remain free from injury will improve Outcome: Progressing

## 2023-05-08 NOTE — Progress Notes (Signed)
PHARMACY - ANTICOAGULATION CONSULT NOTE  Pharmacy Consult for IV heparin Indication: LV thrombus  No Known Allergies  Patient Measurements: Height: 6\' 5"  (195.6 cm) Weight: 115.5 kg (254 lb 9.6 oz) IBW/kg (Calculated) : 89.1 Heparin Dosing Weight: 113 kg  Vital Signs: Temp: 98.6 F (37 C) (12/20 0352) BP: 125/95 (12/20 0352) Pulse Rate: 64 (12/20 0352)  Labs: Recent Labs    05/06/23 0530 05/06/23 1537 05/07/23 0434 05/07/23 1518 05/08/23 0516  HGB 14.7  --  15.4  --  14.5  HCT 44.9  --  48.2  --  45.6  PLT 270  --  303  --  257  HEPARINUNFRC  --   --  0.30 0.33 0.34  CREATININE 1.16 1.15 1.17  --   --     Estimated Creatinine Clearance: 98.2 mL/min (by C-G formula based on SCr of 1.17 mg/dL).  Assessment: 12 yoM with PMH CAD, CHF known to pharmacy from antibiotic dosing. Briefly; admitted with concern for LE edema and possible DM wounds. TTE done for CHF workup shows new LV thrombus; Cardiology consulted and Pharmacy to dose IV heparin . Not on anticoagulation PTA.  Baseline CBC WNL.   Significant events:  Today, 05/08/2023: Heparin level is 0.34, remains therapeutic on IV heparin 2100 units/hr   CBC: WNL SCr slightly elevated (baseline ~0.9) Per RN no bleeding or issues with IV heparin infusion  Goal of Therapy: Heparin level 0.3-0.7 units/ml Monitor platelets by anticoagulation protocol: Yes  Plan: Continue  IV heparin at 2100 units/hr  Daily CBC, daily heparin level  Monitor for signs of bleeding or thrombosis   Thank you for allowing pharmacy to be a part of this patient's care.  Selinda Eon, PharmD, BCPS Clinical Pharmacist [place] 05/08/2023 7:22 AM

## 2023-05-10 LAB — CULTURE, BLOOD (ROUTINE X 2)
Culture: NO GROWTH
Culture: NO GROWTH
Special Requests: ADEQUATE
Special Requests: ADEQUATE

## 2023-05-11 LAB — DERMATOLOGY PATHOLOGY

## 2023-05-12 LAB — CARDIOLIPIN ANTIBODIES, IGG, IGM, IGA
Anticardiolipin IgA: <9 [APL'U]/mL (ref 0–11)
Anticardiolipin IgG: <9 [GPL'U]/mL (ref 0–14)
Anticardiolipin IgM: <9 [MPL'U]/mL (ref 0–12)

## 2023-05-12 LAB — BETA-2-GLYCOPROTEIN I ABS, IGG/M/A
Beta-2 Glyco I IgG: <9 GPI IgG units (ref 0–20)
Beta-2-Glycoprotein I IgA: <9 GPI IgA units (ref 0–25)
Beta-2-Glycoprotein I IgM: <9 GPI IgM units (ref 0–32)

## 2023-05-13 DIAGNOSIS — I2510 Atherosclerotic heart disease of native coronary artery without angina pectoris: Secondary | ICD-10-CM | POA: Diagnosis not present

## 2023-05-13 DIAGNOSIS — L97929 Non-pressure chronic ulcer of unspecified part of left lower leg with unspecified severity: Secondary | ICD-10-CM | POA: Diagnosis not present

## 2023-05-13 DIAGNOSIS — E785 Hyperlipidemia, unspecified: Secondary | ICD-10-CM | POA: Diagnosis not present

## 2023-05-13 DIAGNOSIS — N179 Acute kidney failure, unspecified: Secondary | ICD-10-CM | POA: Diagnosis not present

## 2023-05-13 DIAGNOSIS — I5022 Chronic systolic (congestive) heart failure: Secondary | ICD-10-CM | POA: Diagnosis not present

## 2023-05-13 DIAGNOSIS — R7989 Other specified abnormal findings of blood chemistry: Secondary | ICD-10-CM | POA: Diagnosis not present

## 2023-05-13 DIAGNOSIS — R55 Syncope and collapse: Secondary | ICD-10-CM | POA: Diagnosis not present

## 2023-05-13 DIAGNOSIS — Z1152 Encounter for screening for COVID-19: Secondary | ICD-10-CM | POA: Diagnosis not present

## 2023-05-13 DIAGNOSIS — Z79899 Other long term (current) drug therapy: Secondary | ICD-10-CM | POA: Diagnosis not present

## 2023-05-13 DIAGNOSIS — R531 Weakness: Secondary | ICD-10-CM | POA: Diagnosis not present

## 2023-05-13 DIAGNOSIS — E872 Acidosis, unspecified: Secondary | ICD-10-CM | POA: Diagnosis not present

## 2023-05-13 DIAGNOSIS — R000 Tachycardia, unspecified: Secondary | ICD-10-CM | POA: Diagnosis not present

## 2023-05-13 DIAGNOSIS — Z7901 Long term (current) use of anticoagulants: Secondary | ICD-10-CM | POA: Diagnosis not present

## 2023-05-13 DIAGNOSIS — I509 Heart failure, unspecified: Secondary | ICD-10-CM | POA: Diagnosis not present

## 2023-05-13 DIAGNOSIS — R4182 Altered mental status, unspecified: Secondary | ICD-10-CM | POA: Diagnosis not present

## 2023-05-13 DIAGNOSIS — E8770 Fluid overload, unspecified: Secondary | ICD-10-CM | POA: Diagnosis not present

## 2023-05-13 DIAGNOSIS — L97919 Non-pressure chronic ulcer of unspecified part of right lower leg with unspecified severity: Secondary | ICD-10-CM | POA: Diagnosis not present

## 2023-05-13 DIAGNOSIS — M549 Dorsalgia, unspecified: Secondary | ICD-10-CM | POA: Diagnosis not present

## 2023-05-13 DIAGNOSIS — Z20822 Contact with and (suspected) exposure to covid-19: Secondary | ICD-10-CM | POA: Diagnosis not present

## 2023-05-13 DIAGNOSIS — Z7982 Long term (current) use of aspirin: Secondary | ICD-10-CM | POA: Diagnosis not present

## 2023-05-13 DIAGNOSIS — R0602 Shortness of breath: Secondary | ICD-10-CM | POA: Diagnosis not present

## 2023-05-13 DIAGNOSIS — I110 Hypertensive heart disease with heart failure: Secondary | ICD-10-CM | POA: Diagnosis not present

## 2023-05-13 DIAGNOSIS — Z7984 Long term (current) use of oral hypoglycemic drugs: Secondary | ICD-10-CM | POA: Diagnosis not present

## 2023-05-13 DIAGNOSIS — E11622 Type 2 diabetes mellitus with other skin ulcer: Secondary | ICD-10-CM | POA: Diagnosis not present

## 2023-05-14 ENCOUNTER — Ambulatory Visit: Payer: Self-pay | Admitting: Family Medicine

## 2023-05-14 DIAGNOSIS — I110 Hypertensive heart disease with heart failure: Secondary | ICD-10-CM | POA: Diagnosis not present

## 2023-05-14 DIAGNOSIS — Z7982 Long term (current) use of aspirin: Secondary | ICD-10-CM | POA: Diagnosis not present

## 2023-05-14 DIAGNOSIS — Z79899 Other long term (current) drug therapy: Secondary | ICD-10-CM | POA: Diagnosis not present

## 2023-05-14 DIAGNOSIS — R55 Syncope and collapse: Secondary | ICD-10-CM | POA: Diagnosis not present

## 2023-05-14 DIAGNOSIS — M549 Dorsalgia, unspecified: Secondary | ICD-10-CM | POA: Diagnosis not present

## 2023-05-14 DIAGNOSIS — N179 Acute kidney failure, unspecified: Secondary | ICD-10-CM | POA: Diagnosis not present

## 2023-05-14 DIAGNOSIS — R000 Tachycardia, unspecified: Secondary | ICD-10-CM | POA: Diagnosis not present

## 2023-05-14 DIAGNOSIS — E11622 Type 2 diabetes mellitus with other skin ulcer: Secondary | ICD-10-CM | POA: Diagnosis not present

## 2023-05-14 DIAGNOSIS — L97929 Non-pressure chronic ulcer of unspecified part of left lower leg with unspecified severity: Secondary | ICD-10-CM | POA: Diagnosis not present

## 2023-05-14 DIAGNOSIS — R4182 Altered mental status, unspecified: Secondary | ICD-10-CM | POA: Diagnosis not present

## 2023-05-14 DIAGNOSIS — Z1152 Encounter for screening for COVID-19: Secondary | ICD-10-CM | POA: Diagnosis not present

## 2023-05-14 DIAGNOSIS — E785 Hyperlipidemia, unspecified: Secondary | ICD-10-CM | POA: Diagnosis not present

## 2023-05-14 DIAGNOSIS — I5022 Chronic systolic (congestive) heart failure: Secondary | ICD-10-CM | POA: Diagnosis not present

## 2023-05-14 DIAGNOSIS — Z7901 Long term (current) use of anticoagulants: Secondary | ICD-10-CM | POA: Diagnosis not present

## 2023-05-14 DIAGNOSIS — L97919 Non-pressure chronic ulcer of unspecified part of right lower leg with unspecified severity: Secondary | ICD-10-CM | POA: Diagnosis not present

## 2023-05-14 DIAGNOSIS — R531 Weakness: Secondary | ICD-10-CM | POA: Diagnosis not present

## 2023-05-14 DIAGNOSIS — E872 Acidosis, unspecified: Secondary | ICD-10-CM | POA: Diagnosis not present

## 2023-05-14 DIAGNOSIS — Z7984 Long term (current) use of oral hypoglycemic drugs: Secondary | ICD-10-CM | POA: Diagnosis not present

## 2023-05-14 DIAGNOSIS — E8770 Fluid overload, unspecified: Secondary | ICD-10-CM | POA: Diagnosis not present

## 2023-05-14 DIAGNOSIS — Z20822 Contact with and (suspected) exposure to covid-19: Secondary | ICD-10-CM | POA: Diagnosis not present

## 2023-05-14 DIAGNOSIS — I493 Ventricular premature depolarization: Secondary | ICD-10-CM | POA: Diagnosis not present

## 2023-05-14 DIAGNOSIS — I2510 Atherosclerotic heart disease of native coronary artery without angina pectoris: Secondary | ICD-10-CM | POA: Diagnosis not present

## 2023-05-14 LAB — CRYOGLOBULIN

## 2023-05-14 NOTE — Telephone Encounter (Signed)
  Chief Complaint: confusion/delirium Symptoms: memory issues, pacing, "brain fog" per daughter. Frequency: started yesterday Pertinent Negatives: Patient denies fever Disposition: [x] ED /[] Urgent Care (no appt availability in office) / [] Appointment(In office/virtual)/ []  [facility] [facility]/ [] Home Care/ [] Refused Recommended Disposition /[] [facility] [facility]/ []  Follow-up with PCP Additional Notes: patient's daughter calling. Endorses patient with complicated medical history has been confused with some delirium since yesterday. Daughter states patient is having a hard time remember anything, doesn't recognize family members. Daughter states "if I didn't know better, I would think he has Alzheimer's Disease." States he has never acted like this. Daughter also stated patient endorsed pain while urinating. This RN recommends going back to the ED to get additional evaluation. Daughter verbalizes understanding and states that she will be taking him back to the ED. All questions answered.    Copied from CRM [id number]. Topic: Clinical - Pink Word Triage >> May 14, 2023 12:16 PM Suzette B wrote: Reason for Triage: Patient's daughter stated that the patient has been in and out of the hospital for the past few weeks, he was given insulin medication by Dr. Louanne Skye on his last visit but the hospital took him off a couple of days ago, patient is not remember anything, having brain fog, very disoriented. Reason for Disposition  Patient sounds very sick or weak to the triager  Patient sounds very sick or weak to the triager  Answer Assessment - Initial Assessment Questions 1. LEVEL OF CONSCIOUSNESS: "How is he (she, the patient) acting right now?" (e.g., alert-oriented, confused, lethargic, stuporous, comatose)     Patient's daughter states that he is acting like he has Alzheimer's Disease 2. ONSET: "When did the confusion start?"  (minutes, hours, days)     yesterday 3. PATTERN "Does this come and  go, or has it been constant since it started?"  "Is it present now?"     Continuous  4. ALCOHOL or DRUGS: "Has he been drinking alcohol or taking any drugs?"      Currently no 5. NARCOTIC MEDICINES: "Has he been receiving any narcotic medications?" (e.g., morphine, Vicodin)     No 6. CAUSE: "What do you think is causing the confusion?"      Unsure 7. OTHER SYMPTOMS: "Are there any other symptoms?" (e.g., difficulty breathing, headache, fever, weakness)     Weakness, cough  Answer Assessment - Initial Assessment Questions 1. SYMPTOM: "What's the main symptom you're concerned about?" (e.g., frequency, incontinence)     incontinence 2. ONSET: "When did the  incontinence  start?"     Yesterday 3. PAIN: "Is there any pain?" If Yes, ask: "How bad is it?" (Scale: 1-10; mild, moderate, severe)     Yes-moderate 4. CAUSE: "What do you think is causing the symptoms?"     unsure 5. OTHER SYMPTOMS: "Do you have any other symptoms?" (e.g., blood in urine, fever, flank pain, pain with urination)     Pain with urination  Protocols used: Confusion - Delirium-A-AH, Urinary Symptoms-A-AH

## 2023-05-14 NOTE — Telephone Encounter (Signed)
It looks like to pt has arrived at [facility] ED for evaluation, will close encounter.

## 2023-05-15 DIAGNOSIS — L97919 Non-pressure chronic ulcer of unspecified part of right lower leg with unspecified severity: Secondary | ICD-10-CM | POA: Diagnosis not present

## 2023-05-15 DIAGNOSIS — Z7984 Long term (current) use of oral hypoglycemic drugs: Secondary | ICD-10-CM | POA: Diagnosis not present

## 2023-05-15 DIAGNOSIS — E119 Type 2 diabetes mellitus without complications: Secondary | ICD-10-CM | POA: Diagnosis not present

## 2023-05-15 DIAGNOSIS — R000 Tachycardia, unspecified: Secondary | ICD-10-CM | POA: Diagnosis not present

## 2023-05-15 DIAGNOSIS — E785 Hyperlipidemia, unspecified: Secondary | ICD-10-CM | POA: Diagnosis not present

## 2023-05-15 DIAGNOSIS — L97929 Non-pressure chronic ulcer of unspecified part of left lower leg with unspecified severity: Secondary | ICD-10-CM | POA: Diagnosis not present

## 2023-05-15 DIAGNOSIS — R0902 Hypoxemia: Secondary | ICD-10-CM | POA: Diagnosis not present

## 2023-05-15 DIAGNOSIS — E11622 Type 2 diabetes mellitus with other skin ulcer: Secondary | ICD-10-CM | POA: Diagnosis not present

## 2023-05-15 DIAGNOSIS — I959 Hypotension, unspecified: Secondary | ICD-10-CM | POA: Diagnosis not present

## 2023-05-15 DIAGNOSIS — I5022 Chronic systolic (congestive) heart failure: Secondary | ICD-10-CM | POA: Diagnosis not present

## 2023-05-15 DIAGNOSIS — R4182 Altered mental status, unspecified: Secondary | ICD-10-CM | POA: Diagnosis not present

## 2023-05-15 DIAGNOSIS — N179 Acute kidney failure, unspecified: Secondary | ICD-10-CM | POA: Diagnosis not present

## 2023-05-15 DIAGNOSIS — E8770 Fluid overload, unspecified: Secondary | ICD-10-CM | POA: Diagnosis not present

## 2023-05-15 DIAGNOSIS — I10 Essential (primary) hypertension: Secondary | ICD-10-CM | POA: Diagnosis not present

## 2023-05-15 DIAGNOSIS — R0689 Other abnormalities of breathing: Secondary | ICD-10-CM | POA: Diagnosis not present

## 2023-05-15 DIAGNOSIS — I110 Hypertensive heart disease with heart failure: Secondary | ICD-10-CM | POA: Diagnosis not present

## 2023-05-15 DIAGNOSIS — I2510 Atherosclerotic heart disease of native coronary artery without angina pectoris: Secondary | ICD-10-CM | POA: Diagnosis not present

## 2023-05-15 DIAGNOSIS — Z7901 Long term (current) use of anticoagulants: Secondary | ICD-10-CM | POA: Diagnosis not present

## 2023-05-15 DIAGNOSIS — L281 Prurigo nodularis: Secondary | ICD-10-CM | POA: Diagnosis not present

## 2023-05-15 DIAGNOSIS — Z7982 Long term (current) use of aspirin: Secondary | ICD-10-CM | POA: Diagnosis not present

## 2023-05-15 DIAGNOSIS — Z1152 Encounter for screening for COVID-19: Secondary | ICD-10-CM | POA: Diagnosis not present

## 2023-05-15 DIAGNOSIS — R609 Edema, unspecified: Secondary | ICD-10-CM | POA: Diagnosis not present

## 2023-05-15 DIAGNOSIS — R55 Syncope and collapse: Secondary | ICD-10-CM | POA: Diagnosis not present

## 2023-05-15 DIAGNOSIS — E872 Acidosis, unspecified: Secondary | ICD-10-CM | POA: Diagnosis not present

## 2023-05-15 DIAGNOSIS — R531 Weakness: Secondary | ICD-10-CM | POA: Diagnosis not present

## 2023-05-15 DIAGNOSIS — I5020 Unspecified systolic (congestive) heart failure: Secondary | ICD-10-CM | POA: Diagnosis not present

## 2023-05-15 DIAGNOSIS — M549 Dorsalgia, unspecified: Secondary | ICD-10-CM | POA: Diagnosis not present

## 2023-05-15 DIAGNOSIS — Z79899 Other long term (current) drug therapy: Secondary | ICD-10-CM | POA: Diagnosis not present

## 2023-05-16 DIAGNOSIS — Z955 Presence of coronary angioplasty implant and graft: Secondary | ICD-10-CM | POA: Diagnosis not present

## 2023-05-16 DIAGNOSIS — I252 Old myocardial infarction: Secondary | ICD-10-CM | POA: Diagnosis not present

## 2023-05-16 DIAGNOSIS — I5022 Chronic systolic (congestive) heart failure: Secondary | ICD-10-CM | POA: Diagnosis not present

## 2023-05-16 DIAGNOSIS — R0600 Dyspnea, unspecified: Secondary | ICD-10-CM | POA: Diagnosis not present

## 2023-05-16 DIAGNOSIS — Z7982 Long term (current) use of aspirin: Secondary | ICD-10-CM | POA: Diagnosis not present

## 2023-05-16 DIAGNOSIS — K828 Other specified diseases of gallbladder: Secondary | ICD-10-CM | POA: Diagnosis not present

## 2023-05-16 DIAGNOSIS — Z79899 Other long term (current) drug therapy: Secondary | ICD-10-CM | POA: Diagnosis not present

## 2023-05-16 DIAGNOSIS — R0602 Shortness of breath: Secondary | ICD-10-CM | POA: Diagnosis not present

## 2023-05-16 DIAGNOSIS — I2510 Atherosclerotic heart disease of native coronary artery without angina pectoris: Secondary | ICD-10-CM | POA: Diagnosis not present

## 2023-05-16 DIAGNOSIS — J9811 Atelectasis: Secondary | ICD-10-CM | POA: Diagnosis not present

## 2023-05-16 DIAGNOSIS — I110 Hypertensive heart disease with heart failure: Secondary | ICD-10-CM | POA: Diagnosis not present

## 2023-05-16 DIAGNOSIS — R1010 Upper abdominal pain, unspecified: Secondary | ICD-10-CM | POA: Diagnosis not present

## 2023-05-16 DIAGNOSIS — E785 Hyperlipidemia, unspecified: Secondary | ICD-10-CM | POA: Diagnosis not present

## 2023-05-16 DIAGNOSIS — Z7984 Long term (current) use of oral hypoglycemic drugs: Secondary | ICD-10-CM | POA: Diagnosis not present

## 2023-05-16 DIAGNOSIS — E119 Type 2 diabetes mellitus without complications: Secondary | ICD-10-CM | POA: Diagnosis not present

## 2023-05-16 DIAGNOSIS — R109 Unspecified abdominal pain: Secondary | ICD-10-CM | POA: Diagnosis not present

## 2023-05-16 DIAGNOSIS — I517 Cardiomegaly: Secondary | ICD-10-CM | POA: Diagnosis not present

## 2023-05-16 DIAGNOSIS — K5900 Constipation, unspecified: Secondary | ICD-10-CM | POA: Diagnosis not present

## 2023-05-16 DIAGNOSIS — R188 Other ascites: Secondary | ICD-10-CM | POA: Diagnosis not present

## 2023-05-16 DIAGNOSIS — R55 Syncope and collapse: Secondary | ICD-10-CM | POA: Diagnosis not present

## 2023-05-18 ENCOUNTER — Other Ambulatory Visit: Payer: Self-pay | Admitting: Family Medicine

## 2023-05-18 DIAGNOSIS — E11628 Type 2 diabetes mellitus with other skin complications: Secondary | ICD-10-CM

## 2023-05-18 NOTE — Telephone Encounter (Signed)
Copied from CRM [id number]. Topic: Clinical - Medication Refill >> May 18, 2023  9:45 AM Nada Libman H wrote: Most Recent Primary Care Visit:  Provider: [facility]  Department: [facility]  Visit Type: ACUTE  Date: 05/04/2023 empagliflozin (JARDIANCE) 25 MG TABS tablet [[id number]] Medication:   Has the patient contacted their pharmacy? Yes (Agent: If no, request that the patient contact the pharmacy for the refill. If patient does not wish to contact the pharmacy document the reason why and proceed with request.) (Agent: If yes, when and what did the pharmacy advise?)  Is this the correct pharmacy for this prescription? Yes If no, delete pharmacy and type the correct one.  This is the patient's preferred pharmacy:  [id number] - [place], [place] - [address] [place] [address] Phone: [phone] Fax: [phone]   Has the prescription been filled recently? No  Is the patient out of the medication? Yes  Has the patient been seen for an appointment in the last year OR does the patient have an upcoming appointment? Yes  Can we respond through MyChart? Yes  Agent: Please be advised that Rx refills may take up to 3 business days. We ask that you follow-up with your pharmacy.

## 2023-05-18 NOTE — Telephone Encounter (Signed)
Patient should have enough until his appointment with PCP on 1/2.

## 2023-05-20 DIAGNOSIS — I252 Old myocardial infarction: Secondary | ICD-10-CM | POA: Diagnosis not present

## 2023-05-20 DIAGNOSIS — I509 Heart failure, unspecified: Secondary | ICD-10-CM | POA: Diagnosis not present

## 2023-05-20 DIAGNOSIS — Z79899 Other long term (current) drug therapy: Secondary | ICD-10-CM | POA: Diagnosis not present

## 2023-05-20 DIAGNOSIS — R0602 Shortness of breath: Secondary | ICD-10-CM | POA: Diagnosis not present

## 2023-05-20 DIAGNOSIS — R600 Localized edema: Secondary | ICD-10-CM | POA: Diagnosis not present

## 2023-05-20 DIAGNOSIS — I10 Essential (primary) hypertension: Secondary | ICD-10-CM | POA: Diagnosis not present

## 2023-05-20 DIAGNOSIS — E119 Type 2 diabetes mellitus without complications: Secondary | ICD-10-CM | POA: Diagnosis not present

## 2023-05-21 ENCOUNTER — Encounter: Payer: Self-pay | Admitting: Family Medicine

## 2023-05-21 ENCOUNTER — Ambulatory Visit: Payer: Medicaid Other | Admitting: Family Medicine

## 2023-05-21 VITALS — BP 116/84 | HR 100 | Ht 77.0 in | Wt 268.0 lb

## 2023-05-21 DIAGNOSIS — E11628 Type 2 diabetes mellitus with other skin complications: Secondary | ICD-10-CM

## 2023-05-21 DIAGNOSIS — L97909 Non-pressure chronic ulcer of unspecified part of unspecified lower leg with unspecified severity: Secondary | ICD-10-CM

## 2023-05-21 DIAGNOSIS — R0989 Other specified symptoms and signs involving the circulatory and respiratory systems: Secondary | ICD-10-CM | POA: Diagnosis not present

## 2023-05-21 DIAGNOSIS — I509 Heart failure, unspecified: Secondary | ICD-10-CM

## 2023-05-21 DIAGNOSIS — I776 Arteritis, unspecified: Secondary | ICD-10-CM

## 2023-05-21 DIAGNOSIS — R0602 Shortness of breath: Secondary | ICD-10-CM | POA: Diagnosis not present

## 2023-05-21 DIAGNOSIS — Z7984 Long term (current) use of oral hypoglycemic drugs: Secondary | ICD-10-CM

## 2023-05-21 DIAGNOSIS — E11622 Type 2 diabetes mellitus with other skin ulcer: Secondary | ICD-10-CM

## 2023-05-21 DIAGNOSIS — F410 Panic disorder [episodic paroxysmal anxiety] without agoraphobia: Secondary | ICD-10-CM | POA: Diagnosis not present

## 2023-05-21 DIAGNOSIS — I5043 Acute on chronic combined systolic (congestive) and diastolic (congestive) heart failure: Secondary | ICD-10-CM | POA: Diagnosis not present

## 2023-05-21 DIAGNOSIS — R931 Abnormal findings on diagnostic imaging of heart and coronary circulation: Secondary | ICD-10-CM

## 2023-05-21 MED ORDER — FUROSEMIDE 40 MG PO TABS: 40.0000 mg | ORAL_TABLET | Freq: Every day | ORAL | 3 refills | Status: DC

## 2023-05-21 MED ORDER — FLUTICASONE PROPIONATE 50 MCG/ACT NA SUSP: 1.0000 | Freq: Two times a day (BID) | NASAL | 6 refills | Status: AC | PRN

## 2023-05-21 MED ORDER — CARVEDILOL 6.25 MG PO TABS: 6.2500 mg | ORAL_TABLET | Freq: Two times a day (BID) | ORAL | 3 refills | Status: DC

## 2023-05-21 MED ORDER — EMPAGLIFLOZIN 25 MG PO TABS: 25.0000 mg | ORAL_TABLET | Freq: Every day | ORAL | 3 refills | Status: DC

## 2023-05-21 MED ORDER — SILVER SULFADIAZINE 1 % EX CREA: 1.0000 | TOPICAL_CREAM | Freq: Every day | CUTANEOUS | 1 refills | Status: DC

## 2023-05-21 MED ORDER — SILVER NITRATE-POT NITRATE 75-25 % EX MISC: CUTANEOUS | 0 refills | Status: DC

## 2023-05-21 MED ORDER — SANTYL 250 UNIT/GM EX OINT: 1.0000 | TOPICAL_OINTMENT | Freq: Every day | CUTANEOUS | 1 refills | Status: DC

## 2023-05-21 MED ORDER — HYDROXYZINE PAMOATE 25 MG PO CAPS: 25.0000 mg | ORAL_CAPSULE | Freq: Three times a day (TID) | ORAL | 2 refills | Status: DC | PRN

## 2023-05-21 NOTE — Progress Notes (Signed)
 BP 116/84   Pulse 100   Ht 6' 5 (1.956 m)   Wt 268 lb (121.6 kg)   SpO2 97%   BMI 31.78 kg/m    Subjective:   Patient ID: Evan [name], male    DOB: 11/11/1965, 58 y.o.   MRN: [id number]  HPI: Evan [name] is a 58 y.o. male presenting on 05/21/2023 for Medical Management of Chronic Issues, Diabetes, Wound Check (Multiple diabetic ulcers BLE), Anxiety (Having panic attacks during the pm), and Insomnia   HPI Congestive heart failure recheck Patient is coming in for hospital follow-up/ER follow-up for congestive heart failure.  He is still having a lot of fluid and swelling in his legs.  He denies any shortness of breath today except for when he lays flat sometimes he still has it.  He has increased his diuretic to where he is taking the Lasix  40 mg twice daily and it does seem to be helping some.  His weight still is up about 8 pounds from where he was about a month ago.  He is currently on carvedilol  3.125 and his heart rate today is 100, he says he could not tolerate the metoprolol  and made him feel horrible.  Bilateral lower extremity wounds Patient was in the emergency department in our office a few different times for these different things and has not been able to get into a specific dermatologist or wound care specialist.  He was biopsied and diagnosed with vasculitis.  He has been using Silvadene  cream on it it does help some.  Anxiety With everything going on he has been having some overwhelming anxiety at times where he will get panicky and have trouble sleeping.  He has not been sleeping well in the past week.  He denies any suicidal ideations or thoughts of himself.  Relevant past medical, surgical, family and social history reviewed and updated as indicated. Interim medical history since our last visit reviewed. Allergies and medications reviewed and updated.  Review of Systems  Constitutional:  Positive for unexpected weight change. Negative for chills and fever.   Eyes:  Negative for visual disturbance.  Respiratory:  Negative for shortness of breath and wheezing.   Cardiovascular:  Positive for leg swelling. Negative for chest pain.  Gastrointestinal:  Positive for abdominal pain.  Musculoskeletal:  Negative for back pain and gait problem.  Skin:  Positive for color change, rash and wound.  All other systems reviewed and are negative.   Per HPI unless specifically indicated above   Allergies as of 05/21/2023   No Known Allergies      Medication List        Accurate as of May 21, 2023 11:55 AM. If you have any questions, ask your nurse or doctor.          STOP taking these medications    metoprolol  succinate 25 MG 24 hr tablet Commonly known as: Toprol  XL Stopped by: Fonda LABOR Laval Cafaro       TAKE these medications    Accu-Chek Aviva device Test BS BID Dx E11.69   Accu-Chek Aviva Plus w/Device Kit 1 each by Does not apply route 4 (four) times daily.   Accu-Chek Guide test strip Generic drug: glucose blood Check BS in the morning and at bedtime Dx E11.8   acetaminophen  325 MG tablet Commonly known as: TYLENOL  Take 2 tablets (650 mg total) by mouth every 6 (six) hours as needed for mild pain (pain score 1-3) or fever (or Fever >/= 101).  aspirin  EC 81 MG tablet Take 1 tablet (81 mg total) by mouth daily with breakfast. Swallow whole.   atorvastatin  80 MG tablet Commonly known as: LIPITOR  Take 1 tablet (80 mg total) by mouth daily.   BD Pen Needle Nano 2nd Gen 32G X 4 MM Misc Generic drug: Insulin  Pen Needle Use daily with insulin  Dx E11.9, Z79.4   carvedilol  6.25 MG tablet Commonly known as: COREG  Take 1 tablet (6.25 mg total) by mouth 2 (two) times daily with a meal. Started by: Fonda LABOR Geralynn Capri   empagliflozin  25 MG Tabs tablet Commonly known as: Jardiance  Take 1 tablet (25 mg total) by mouth daily.   famotidine  20 MG tablet Commonly known as: Pepcid  Take 1 tablet (20 mg total) by mouth 2 (two)  times daily.   fenofibrate  145 MG tablet Commonly known as: TRICOR  Take 1 tablet (145 mg total) by mouth daily.   Fish Oil 1000 MG Caps Take 2,000 mg by mouth every other day.   fluticasone  50 MCG/ACT nasal spray Commonly known as: FLONASE  Place 1 spray into both nostrils 2 (two) times daily as needed for allergies or rhinitis. Started by: Fonda LABOR Kitai Purdom   furosemide  40 MG tablet Commonly known as: LASIX  Take 1 tablet (40 mg total) by mouth daily. What changed:  medication strength how much to take when to take this Changed by: Fonda LABOR Grasiela Jonsson   hydrOXYzine  25 MG capsule Commonly known as: VISTARIL  Take 1 capsule (25 mg total) by mouth every 8 (eight) hours as needed. Started by: Fonda LABOR Greysin Medlen   losartan  25 MG tablet Commonly known as: COZAAR  Take 1 tablet (25 mg total) by mouth daily. What changed: when to take this   pantoprazole  40 MG tablet Commonly known as: PROTONIX  Take 40 mg by mouth daily.   Santyl  250 UNIT/GM ointment Generic drug: collagenase  Apply 1 Application topically daily. Started by: Fonda LABOR Bernd Crom   silver  nitrate applicators 75-25 % applicator Apply topically 3 (three) times a week. Start taking on: May 22, 2023 Started by: Fonda A Vickki Igou   silver  sulfADIAZINE  1 % cream Commonly known as: Silvadene  Apply 1 Application topically daily. Started by: Fonda LABOR Caycee Wanat   spironolactone  25 MG tablet Commonly known as: ALDACTONE  Take 0.5 tablets (12.5 mg total) by mouth daily.         Objective:   BP 116/84   Pulse 100   Ht 6' 5 (1.956 m)   Wt 268 lb (121.6 kg)   SpO2 97%   BMI 31.78 kg/m   Wt Readings from Last 3 Encounters:  05/21/23 268 lb (121.6 kg)  05/08/23 254 lb 9.6 oz (115.5 kg)  05/04/23 260 lb (117.9 kg)    Physical Exam Vitals and nursing note reviewed.  Constitutional:      General: He is not in acute distress.    Appearance: He is well-developed. He is not diaphoretic.  Eyes:      General: No scleral icterus.       Right eye: No discharge.     Conjunctiva/sclera: Conjunctivae normal.     Pupils: Pupils are equal, round, and reactive to light.  Neck:     Thyroid : No thyromegaly.  Cardiovascular:     Rate and Rhythm: Normal rate and regular rhythm.     Heart sounds: Normal heart sounds. No murmur heard. Pulmonary:     Effort: Pulmonary effort is normal. No respiratory distress.     Breath sounds: Normal breath sounds. No wheezing or rhonchi.  Musculoskeletal:  General: Swelling (2+ peripheral edema.) present.     Cervical back: Neck supple.  Lymphadenopathy:     Cervical: No cervical adenopathy.  Skin:    General: Skin is warm and dry.     Findings: Rash (Scattered ulcerated wounds on bilateral lower extremities, mostly on the calf region.  Some as large as 3 cm in diameter and then smaller ones as well.  No purulence noted.  Fibrotic granular tissue base and most of them) present.  Neurological:     Mental Status: He is alert and oriented to person, place, and time.     Coordination: Coordination normal.  Psychiatric:        Behavior: Behavior normal.     Change dressing daily and then use the Santyl  primarily, he can use the Silvadene  if short on the Santyl .  Assessment & Plan:   Problem List Items Addressed This Visit       Cardiovascular and Mediastinum   CHF (congestive heart failure) (HCC)   Relevant Medications   carvedilol  (COREG ) 6.25 MG tablet   furosemide  (LASIX ) 40 MG tablet   Other Relevant Orders   CBC with Differential/Platelet   CMP14+EGFR   Acute on chronic combined systolic and diastolic CHF (congestive heart failure) (HCC)   Relevant Medications   carvedilol  (COREG ) 6.25 MG tablet   furosemide  (LASIX ) 40 MG tablet   Vasculitis (HCC)   Relevant Medications   collagenase  (SANTYL ) 250 UNIT/GM ointment   silver  sulfADIAZINE  (SILVADENE ) 1 % cream   carvedilol  (COREG ) 6.25 MG tablet   furosemide  (LASIX ) 40 MG tablet   Other  Relevant Orders   Ambulatory referral to Rheumatology     Endocrine   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)   Relevant Medications   empagliflozin  (JARDIANCE ) 25 MG TABS tablet   Other Visit Diagnoses       Diabetic leg ulcer (HCC)    -  Primary   Relevant Medications   collagenase  (SANTYL ) 250 UNIT/GM ointment   silver  sulfADIAZINE  (SILVADENE ) 1 % cream   empagliflozin  (JARDIANCE ) 25 MG TABS tablet   Other Relevant Orders   Ambulatory referral to Wound Clinic   CBC with Differential/Platelet   CMP14+EGFR     Cardiac LV ejection fraction 10-20%         Anxiety attack       Relevant Medications   hydrOXYzine  (VISTARIL ) 25 MG capsule     Send patient silver  nitrate sticks to help clean the wound and then he can use the Santyl  cream that they can get it, if not they can use the Silvadene  cream.  Increase his carvedilol  from 3.125 up to 6.25 mg twice daily.  Sent message to his cardiologist to see if they can get him in sooner. Flonase  Sent hydroxyzine  that he can use for anxiety attacks and help with sleep.  Placed referral to rheumatology and urgent referral to wound care.  Follow up plan: Return if symptoms worsen or fail to improve, for 2 to 3-week wound recheck and CHF.  Counseling provided for all of the vaccine components Orders Placed This Encounter  Procedures   CBC with Differential/Platelet   CMP14+EGFR   Ambulatory referral to Wound Clinic   Ambulatory referral to Rheumatology    Fonda Levins, MD [facility] 05/21/2023, 11:55 AM

## 2023-05-22 ENCOUNTER — Telehealth: Payer: Self-pay | Admitting: Cardiovascular Disease

## 2023-05-22 LAB — CBC WITH DIFFERENTIAL/PLATELET
Basophils Absolute: 0.1 10*3/uL (ref 0.0–0.2)
Basos: 1 %
EOS (ABSOLUTE): 0.1 10*3/uL (ref 0.0–0.4)
Eos: 1 %
Hematocrit: 48.4 % (ref 37.5–51.0)
Hemoglobin: 15.1 g/dL (ref 13.0–17.7)
Immature Grans (Abs): 0.1 10*3/uL (ref 0.0–0.1)
Immature Granulocytes: 1 %
Lymphocytes Absolute: 1.7 10*3/uL (ref 0.7–3.1)
Lymphs: 16 %
MCH: 27.1 pg (ref 26.6–33.0)
MCHC: 31.2 g/dL — ABNORMAL LOW (ref 31.5–35.7)
MCV: 87 fL (ref 79–97)
Monocytes Absolute: 0.9 10*3/uL (ref 0.1–0.9)
Monocytes: 8 %
Neutrophils Absolute: 7.9 10*3/uL — ABNORMAL HIGH (ref 1.4–7.0)
Neutrophils: 73 %
Platelets: 258 10*3/uL (ref 150–450)
RBC: 5.57 x10E6/uL (ref 4.14–5.80)
RDW: 14 % (ref 11.6–15.4)
WBC: 10.7 10*3/uL (ref 3.4–10.8)

## 2023-05-22 LAB — CMP14+EGFR
ALT: 38 [IU]/L (ref 0–44)
AST: 25 [IU]/L (ref 0–40)
Albumin: 3.8 g/dL (ref 3.8–4.9)
Alkaline Phosphatase: 108 [IU]/L (ref 44–121)
BUN/Creatinine Ratio: 19 (ref 9–20)
BUN: 24 mg/dL (ref 6–24)
Bilirubin Total: 0.7 mg/dL (ref 0.0–1.2)
CO2: 23 mmol/L (ref 20–29)
Calcium: 8.9 mg/dL (ref 8.7–10.2)
Chloride: 100 mmol/L (ref 96–106)
Creatinine, Ser: 1.27 mg/dL (ref 0.76–1.27)
Globulin, Total: 2.6 g/dL (ref 1.5–4.5)
Glucose: 163 mg/dL — ABNORMAL HIGH (ref 70–99)
Potassium: 4.8 mmol/L (ref 3.5–5.2)
Sodium: 137 mmol/L (ref 134–144)
Total Protein: 6.4 g/dL (ref 6.0–8.5)
eGFR: 66 mL/min/{1.73_m2} (ref >59)

## 2023-05-22 NOTE — Telephone Encounter (Signed)
 Patient identification verified by 2 forms. Evan Cooks, RN    Called and spoke to patient  Patient states:   -he is having a hard time catching his breath   -can't walk a few feet without becoming out of breath   -saw PCP yesterday, was advised to [facility]   -PCP increased Lasix  to 40mg  yesterday, started taking it this morning   -has presented to hospital multiple time since discharge 12/20 due to SOB   -when he lays down he has heart palpitation and SOB  Patient denies:   -chest pain  Patient scheduled for OV 1/7 with NP [facility] at 2:45pm  Informed patient message sent to Dr. Court for input/advisement  Reviewed ED warning signs/precautions  Patient verbalized understanding, no questions at this time

## 2023-05-22 NOTE — Telephone Encounter (Signed)
 Pt c/o Shortness Of Breath: STAT if SOB developed within the last 24 hours or pt is noticeably SOB on the phone  1. Are you currently SOB (can you hear that pt is SOB on the phone)? no  2. How long have you been experiencing SOB? Several weeks.   3. Are you SOB when sitting or when up moving around? both  4. Are you currently experiencing any other symptoms? No other symptoms.

## 2023-05-24 NOTE — Progress Notes (Signed)
 Cardiology Office Note    Date:  05/26/2023  ID:  [name], [name] 03-10-1966, MRN [id number] PCP:  Dettinger, Fonda LABOR, MD  Cardiologist:  Dorn Lesches, MD  Electrophysiologist:  None   Chief Complaint: Shortness of breath   History of Present Illness: .    Evan [name] is a 58 y.o. male with visit-pertinent history of hypertension, hyperlipidemia, DM2, CAD s/p anterolateral MI in 11/2018 with intervention to proximal LAD and staged RCA intervention, ischemic cardiomyopathy with LVEF 20 to 25% at time of MI with repeat LVEF 35 to 40%.   In 09/2022 he was admitted to [facility] with right upper lobe pneumonia treated with IV antibiotics and cough medication.  Echo 10/06/2022 indicated LVEF 35%, grade 1 diastolic dysfunction, mild MR, LA mildly dilated, mild PAH, mild to moderately dilated LV.  His carvedilol  dose was reduced from 25 to 12.5 mg twice daily.   On 03/23/2023 he presented to [facility] ED for 4 days of worsening shortness of breath.  Patient reported that he had run out of his home Lasix  20 mg daily for 3 days prior to visit.  BNP was elevated at 880, D-dimer 1.82, CT with no emergent findings, no evidence of PE.  It was felt that he likely had a mild CHF exacerbation in setting of running out of his medication.  His Lasix  was increased to 40 mg daily for 5 days then to resume 20 mg daily.  On 04/13/2023 he was seen in office for worsening shortness of breath and lower extremity edema and orthopnea.  His Lasix  was increased to 40 mg daily with recommendation for checking labs that day, he did not complete these.  He was hospitalized from 11/30 through 12/3 with acute cholecystitis.  He initially presented with near syncope and was treated medically.  He refused HIDA scan and cholecystectomy.  He was seen by his PCP who was concerned for lower extremity wounds.  Patient reported 2 weeks of lower extremity edema and exertional dyspnea.  He attributed his lower extremity wounds  to chigger bites and noted increased redness, pain and weeping.  He also reported heart racing.  In the ED he was tachycardic at 116 bpm and tachypneic to 23.  Lower extremity ultrasound revealed subcutaneous edema but no definite evidence of osteomyelitis.  He was started on broad-spectrum antibiotics.  Vascular and wound teams were consulted.  Venous Dopplers were negative for DVT.  ABI was normal on the right and noncompressible on the left.  Echocardiogram revealed LVEF of 20% with global hypokinesis and grade 3 diastolic dysfunction, there was a thrombus in the left ventricular apex.  Right ventricular function was also moderately reduced.  PASP was 59.6 mmHg.  He was noted to have moderate mitral regurgitation and right atrial pressure was 15 mmHg.  With new LV thrombus unable to exclude possible infarct however unable to exclude cocaine induced cardiomyopathy.  As he was not having any evidence of ischemic symptoms and with concurrent cocaine use he was considered to be too high risk cath candidate, it was recommended that he be medically managed with no plans for ischemic evaluation.  It was recommended that patient be considered for transfer to tertiary facility given vasculitic skin lesions on lower extremities.  On 05/08/2023 patient became frustrated with wait for transfer to tertiary care center and elected to leave AMA.  He reported that he was going to go to [facility] ER at that time.  On chart review patient presented  to [facility] on 12/20.  On arrival they felt that he was overall euvolemic he received IV Lasix  80 mg once then was transitioned back to Lasix  20 mg daily.  Patient was diagnosed with prurigo nodules he was started on Eliquis  5 mg twice daily and discharged on 05/12/2023.  On 05/13/2023 [facility] for increased shortness of breath and confusion that resolved in the car on the way there.  Patient reported that when he would bend over and  then stands up he could not catch his breath, reported that had been ongoing for years.  His BNP was 1247, chest x-ray with no evidence of acute cardiac or pulmonary abnormality, he was discharged in stable condition.  On 05/14/2023 he presented to [facility] and was admitted for syncope, altered mental status secondary to medication misuse and substance use.  Patient had no true syncope or loss of consciousness, he did present with an altered mental status that was felt to be multifactorial as patient reported taking a 12.5 mg tablet of carvedilol  that morning when he was prescribed 3.125 mg, felt also partly due to substance abuse.  He was discharged on 05/16/2023, BNP was 3,165.  However he returned to the emergency room later that day for shortness of breath, CT scan was reassuring for no evidence of PE or pneumonia.  CT abdomen pelvis stable from prior.   On 05/21/2023 he presented to [facility] ED again with complaints of exertional dyspnea.  Patient stated that he had shortness of breath, was walking his dog and became more dyspneic.  His Lasix  was increased to 40 mg for 1 week and then instructed to go back to his regular dosing.  Today he presents regarding increased shortness of breath and lower extremity edema.  He notes that he feels his edema has been progressing, he also endorses right abdominal pain.  Patient notes he has cholelithiasis.  He reports consistent weight gain although is unable to tell me how much he has gained, he reports that he has been having scrotal and abdominal edema as well.  He has been taking Lasix  40 mg daily and has not been noting any improvement. He notes that he discontinued Eliquis  2 or 3 days ago as it made him feel uneasy. Reports he is overall not feeling well.   Labwork independently reviewed: 05/21/2023: Hemoglobin 15.1, hematocrit 46.4, magnesium  2.1, sodium 136, potassium 4.2, creatinine 1.24, AST 27, ALT 52  ROS: .   Today he denies chest  pain, melena, hematuria, hemoptysis, diaphoresis, weakness, presyncope, syncope, orthopnea, and PND.  All other systems are reviewed and otherwise negative. Studies Reviewed: SABRA    EKG:  EKG is not ordered today.   CV Studies:  Cardiac Studies & Procedures   CARDIAC CATHETERIZATION  CARDIAC CATHETERIZATION 11/26/2018  Narrative  Previously placed Ost LAD to Prox LAD drug eluting stent is widely patent.  Balloon angioplasty was performed.  Previously placed Mid LAD-2 drug eluting stent is widely patent.  Balloon angioplasty was performed.  Mid LAD-1 lesion is 30% stenosed.  Dist Cx lesion is 99% stenosed.  Prox RCA to Mid RCA lesion is 95% stenosed.  Dist RCA lesion is 30% stenosed.  RPAV lesion is 100% stenosed.  Post intervention, there is a 0% residual stenosis.  A drug-eluting stent was successfully placed using a STENT RESOLUTE ONYX 2.75X18.  1.  Widely patent LAD stents with unchanged coronary anatomy. 2.  LVEDP was 13 mmHg with mild hypotension.  The patient was given 250 normal saline bolus. 3.  Successful angioplasty and drug-eluting stent placement to the proximal/mid right coronary artery.  Recommendations: Continue dual antiplatelet therapy. I decreased furosemide  to 40 mg once daily.  Continue other heart failure medications.  Given intermittent hypotension.  I do not recommend starting Entresto at the present time.  I elected to start small dose losartan .  If this is well-tolerated, he can be switched as an outpatient to Entresto.  I agree with the LifeVest.  Findings Coronary Findings Diagnostic  Dominance: Right  Left Anterior Descending Collaterals Dist LAD filled by collaterals from RPDA.  Previously placed Ost LAD to Prox LAD drug eluting stent is widely patent. Vessel is the culprit lesion. The lesion is type C and thrombotic with left-to-right collateral flow. Mid LAD-1 lesion is 30% stenosed. The lesion is moderately calcified. Previously  placed Mid LAD-2 drug eluting stent is widely patent. The lesion is not complex (non high-C) and distal to major branch.  Left Circumflex Dist Cx lesion is 99% stenosed.  First Obtuse Marginal Branch The vessel exhibits minimal luminal irregularities.  Second Obtuse Marginal Branch The vessel exhibits minimal luminal irregularities.  Right Coronary Artery Prox RCA to Mid RCA lesion is 95% stenosed. The lesion is not complex (non high-C). The lesion was not previously treated. The stenosis was measured by a visual reading. Pressure wire/FFR was not performed on the lesion. IVUS was not performed. Dist RCA lesion is 30% stenosed.  Right Posterior Atrioventricular Artery RPAV lesion is 100% stenosed.  Intervention  Prox RCA to Mid RCA lesion Stent Lesion length:  16 mm. CATHETER LAUNCHER 6FR JR4 guide catheter was inserted. Lesion crossed with guidewire using a WIRE RUNTHROUGH .[id number]. Pre-stent angioplasty was performed using a BALLOON SAPPHIRE 2.5X12. Maximum pressure:  10 atm. Inflation time:  30 sec. A drug-eluting stent was successfully placed using a STENT RESOLUTE ONYX [id number]. Maximum pressure: 14 atm. Inflation time: 20 sec. Post-stent angioplasty was performed using a BALLOON SAPPHIRE [place] 3.0X10. Maximum pressure:  14 atm. Inflation time:  20 sec. Post-Intervention Lesion Assessment The intervention was successful. Pre-interventional TIMI flow is 3. Post-intervention TIMI flow is 3. No complications occurred at this lesion. There is a 0% residual stenosis post intervention.   CARDIAC CATHETERIZATION 11/24/2018  Narrative  Prox RCA to Mid RCA lesion is 95% stenosed.  Dist RCA lesion is 30% stenosed.  RPAV lesion is 100% stenosed.  Dist Cx lesion is 99% stenosed.  There is severe left ventricular systolic dysfunction.  LV end diastolic pressure is severely elevated.  The left ventricular ejection fraction is less than 25% by visual estimate.  Ost LAD to Prox LAD  lesion is 100% stenosed.  Post intervention, there is a 0% residual stenosis.  A drug-eluting stent was successfully placed using a STENT RESOLUTE ONYX 3.5X22.  A drug-eluting stent was successfully placed using a STENT RESOLUTE ONYX [id number].  Mid LAD-1 lesion is 30% stenosed.  Mid LAD-2 lesion is 100% stenosed.  Post intervention, there is a 0% residual stenosis.  1.  Severe three-vessel coronary artery disease.  Occluded ostial and mid LAD likely due to late presenting anterior STEMI.  Significant distal left circumflex disease but the supplied territory is small.  Significant mid RCA stenosis with occluded posterior AV groove with left-to-right collaterals. 2.  Severely reduced LV systolic function with an EF of 20 to 25% with anterior wall akinesis.  LVEDP was 32 mmHg. 3.  Successful PCI and drug-eluting stent placement to the ostial as  well as mid LAD.  Recommendations: The patient was given furosemide  40 mg IV after the case.  Continue furosemide  40 mg by mouth twice daily starting tomorrow. I started small dose carvedilol . If renal function remains stable, recommend adding a small dose ARB with plans to transition to Entresto as an outpatient.  In addition, should consider spironolactone  if blood pressure allows. Recommend dual antiplatelet therapy for at least 1 year. Recommend staged mid RCA PCI likely on Friday if the patient remains stable.  The rest of his coronary artery disease should be treated medically.  Findings Coronary Findings Diagnostic  Dominance: Right  Left Anterior Descending Collaterals Dist LAD filled by collaterals from RPDA.  Ost LAD to Prox LAD lesion is 100% stenosed. Vessel is the culprit lesion. The lesion is type C and thrombotic with left-to-right collateral flow. The lesion was not previously treated. Mid LAD-1 lesion is 30% stenosed. The lesion is moderately calcified. Mid LAD-2 lesion is 100% stenosed. The lesion is not complex (non high-C)  and distal to major branch. The lesion was not previously treated.  Left Circumflex Dist Cx lesion is 99% stenosed.  First Obtuse Marginal Branch The vessel exhibits minimal luminal irregularities.  Second Obtuse Marginal Branch The vessel exhibits minimal luminal irregularities.  Right Coronary Artery Prox RCA to Mid RCA lesion is 95% stenosed. Dist RCA lesion is 30% stenosed.  Right Posterior Atrioventricular Artery RPAV lesion is 100% stenosed.  Intervention  Ost LAD to Prox LAD lesion Stent Lesion length:  18 mm. CATHETER LAUNCHER 6FREBU 3.5 guide catheter was inserted. Lesion crossed with guidewire using a WIRE RUNTHROUGH .[id number]. Pre-stent angioplasty was performed using a BALLOON SAPPHIRE 2.5X15. Maximum pressure:  10 atm. Inflation time:  30 sec. A drug-eluting stent was successfully placed using a STENT RESOLUTE ONYX 3.5X22. Maximum pressure: 12 atm. Inflation time: 15 sec. Post-stent angioplasty was performed using a BALLOON SAPPHIRE [place] [id number]. Maximum pressure:  12 atm. Inflation time:  15 sec. Post-Intervention Lesion Assessment The intervention was successful. Pre-interventional TIMI flow is 0. Post-intervention TIMI flow is 3. No complications occurred at this lesion. There is a 0% residual stenosis post intervention.  Mid LAD-2 lesion Stent Lesion length:  16 mm. CATHETER LAUNCHER 6FREBU 3.5 guide catheter was inserted. Lesion crossed with guidewire using a WIRE RUNTHROUGH .[id number]. Pre-stent angioplasty was performed using a BALLOON SAPPHIRE 2.5X15. Maximum pressure:  8 atm. Inflation time:  30 sec. A drug-eluting stent was successfully placed using a STENT RESOLUTE ONYX [id number]. Maximum pressure: 12 atm. Inflation time: 20 sec. Stent does not overlap previously placed stentPost-stent angioplasty was performed using a BALLOON SAPPHIRE [place] 3.0X12. Maximum pressure:  12 atm. Inflation time:  20 sec. Post-Intervention Lesion Assessment The intervention was successful.  Pre-interventional TIMI flow is 0. Post-intervention TIMI flow is 3. No complications occurred at this lesion. There is a 0% residual stenosis post intervention.    ECHOCARDIOGRAM  ECHOCARDIOGRAM COMPLETE 05/05/2023  Narrative ECHOCARDIOGRAM REPORT    Patient Name:   Evan [name] Date of Exam: 05/05/2023 Medical Rec #:  [id number]      Height:       77.0 in Accession #:    [id number]     Weight:       260.0 lb Date of Birth:  1965/05/27      BSA:          2.501 m Patient Age:    57 years       BP:  106/79 mmHg Patient Gender: M              HR:           100 bpm. Exam Location:  Inpatient  Procedure: 2D Echo, Cardiac Doppler, Color Doppler and Intracardiac Opacification Agent  Indications:    CHF  History:        Patient has prior history of Echocardiogram examinations, most recent 08/23/2020. Cardiomyopathy, Previous Myocardial Infarction, Arrythmias:Atrial Fibrillation; Risk Factors:Diabetes, Hypertension and Sleep Apnea.  Sonographer:    Jayson Gaskins Referring Phys: [id number] HAZEL V DUNCAN  IMPRESSIONS   1. Left ventricular ejection fraction, by estimation, is 20%. The left ventricle has severely decreased function. The left ventricle demonstrates global hypokinesis. The left ventricular internal cavity size was mildly dilated. Left ventricular diastolic parameters are consistent with Grade III diastolic dysfunction (restrictive). There was a thrombus at the LV apex. 2. Right ventricular systolic function is moderately reduced. The right ventricular size is mildly enlarged. There is moderately elevated pulmonary artery systolic pressure. The estimated right ventricular systolic pressure is 59.6 mmHg. 3. Left atrial size was moderately dilated. 4. Right atrial size was mildly dilated. 5. The mitral valve is abnormal. Moderate mitral valve regurgitation. No evidence of mitral stenosis. 6. The aortic valve is tricuspid. There is mild calcification of the aortic valve.  Aortic valve regurgitation is not visualized. No aortic stenosis is present. 7. The inferior vena cava is dilated in size with <50% respiratory variability, suggesting right atrial pressure of 15 mmHg.  FINDINGS Left Ventricle: Left ventricular ejection fraction, by estimation, is 20%. The left ventricle has severely decreased function. The left ventricle demonstrates global hypokinesis. The left ventricular internal cavity size was mildly dilated. There is no left ventricular hypertrophy. Left ventricular diastolic parameters are consistent with Grade III diastolic dysfunction (restrictive).  Right Ventricle: The right ventricular size is mildly enlarged. No increase in right ventricular wall thickness. Right ventricular systolic function is moderately reduced. There is moderately elevated pulmonary artery systolic pressure. The tricuspid regurgitant velocity is 3.34 m/s, and with an assumed right atrial pressure of 15 mmHg, the estimated right ventricular systolic pressure is 59.6 mmHg.  Left [facility]: Left atrial size was moderately dilated.  Right [facility]: Right atrial size was mildly dilated.  Pericardium: Trivial pericardial effusion is present.  Mitral Valve: The mitral valve is abnormal. Mild mitral annular calcification. Moderate mitral valve regurgitation. No evidence of mitral valve stenosis.  Tricuspid Valve: The tricuspid valve is normal in structure. Tricuspid valve regurgitation is mild.  Aortic Valve: The aortic valve is tricuspid. There is mild calcification of the aortic valve. Aortic valve regurgitation is not visualized. No aortic stenosis is present. Aortic valve mean gradient measures 3.0 mmHg. Aortic valve peak gradient measures 4.5 mmHg. Aortic valve area, by VTI measures 2.32 cm.  Pulmonic Valve: The pulmonic valve was normal in structure. Pulmonic valve regurgitation is not visualized.  Aorta: The aortic root is normal in size and structure.  Venous: The inferior  vena cava is dilated in size with less than 50% respiratory variability, suggesting right atrial pressure of 15 mmHg.  IAS/Shunts: No atrial level shunt detected by color flow Doppler.   LEFT VENTRICLE PLAX 2D LVIDd:         6.20 cm LVIDs:         5.80 cm LV PW:         0.90 cm LV IVS:        0.60 cm LVOT diam:     2.10  cm LV SV:         33 LV SV Index:   13 LVOT Area:     3.46 cm   RIGHT VENTRICLE RV S prime:     10.30 cm/s TAPSE (M-mode): 0.8 cm  LEFT ATRIUM              Index        RIGHT ATRIUM           Index LA Vol (A2C):   155.0 ml 61.98 ml/m  RA Area:     22.10 cm LA Vol (A4C):   117.0 ml 46.79 ml/m  RA Volume:   71.30 ml  28.51 ml/m LA Biplane Vol: 135.0 ml 53.99 ml/m AORTIC VALVE AV Area (Vmax):    2.05 cm AV Area (Vmean):   2.03 cm AV Area (VTI):     2.32 cm AV Vmax:           106.00 cm/s AV Vmean:          75.700 cm/s AV VTI:            0.143 m AV Peak Grad:      4.5 mmHg AV Mean Grad:      3.0 mmHg LVOT Vmax:         62.70 cm/s LVOT Vmean:        44.400 cm/s LVOT VTI:          0.096 m LVOT/AV VTI ratio: 0.67  AORTA Ao Root diam: 3.20 cm  MITRAL VALVE                TRICUSPID VALVE MV Area (PHT): 6.17 cm     TR Peak grad:   44.6 mmHg MV Decel Time: 123 msec     TR Vmax:        334.00 cm/s MV E velocity: 110.00 cm/s SHUNTS Systemic VTI:  0.10 m Systemic Diam: 2.10 cm  Dalton McleanMD Electronically signed by Ezra Kanner Signature Date/Time: 05/05/2023/11:36:02 AM    Final              Current Reported Medications:.    Current Meds  Medication Sig   acetaminophen  (TYLENOL ) 325 MG tablet Take 2 tablets (650 mg total) by mouth every 6 (six) hours as needed for mild pain (pain score 1-3) or fever (or Fever >/= 101).   aspirin  EC 81 MG tablet Take 1 tablet (81 mg total) by mouth daily with breakfast. Swallow whole.   atorvastatin  (LIPITOR ) 80 MG tablet Take 1 tablet (80 mg total) by mouth daily.   BD PEN NEEDLE NANO 2ND GEN 32G X  4 MM MISC Use daily with insulin  Dx E11.9, Z79.4   Blood Glucose Monitoring Suppl (ACCU-CHEK AVIVA PLUS) w/Device KIT 1 each by Does not apply route 4 (four) times daily.   Blood Glucose Monitoring Suppl (ACCU-CHEK AVIVA) device Test BS BID Dx E11.69   carvedilol  (COREG ) 6.25 MG tablet Take 1 tablet (6.25 mg total) by mouth 2 (two) times daily with a meal.   collagenase  (SANTYL ) 250 UNIT/GM ointment Apply 1 Application topically daily.   empagliflozin  (JARDIANCE ) 25 MG TABS tablet Take 1 tablet (25 mg total) by mouth daily.   famotidine  (PEPCID ) 20 MG tablet Take 1 tablet (20 mg total) by mouth 2 (two) times daily.   fenofibrate  (TRICOR ) 145 MG tablet Take 1 tablet (145 mg total) by mouth daily.   fluticasone  (FLONASE ) 50 MCG/ACT nasal spray Place 1 spray into both nostrils 2 (two) times daily  as needed for allergies or rhinitis.   furosemide  (LASIX ) 40 MG tablet Take 1 tablet (40 mg total) by mouth daily.   glucose blood (ACCU-CHEK GUIDE) test strip Check BS in the morning and at bedtime Dx E11.8   hydrOXYzine  (VISTARIL ) 25 MG capsule Take 1 capsule (25 mg total) by mouth every 8 (eight) hours as needed.   losartan  (COZAAR ) 25 MG tablet Take 1 tablet (25 mg total) by mouth daily. (Patient taking differently: Take 25 mg by mouth at bedtime.)   Omega-3 Fatty Acids (FISH OIL) 1000 MG CAPS Take 2,000 mg by mouth every other day.   pantoprazole  (PROTONIX ) 40 MG tablet Take 40 mg by mouth daily.   silver  nitrate applicators 75-25 % applicator Apply topically 3 (three) times a week.   silver  sulfADIAZINE  (SILVADENE ) 1 % cream Apply 1 Application topically daily.   spironolactone  (ALDACTONE ) 25 MG tablet Take 0.5 tablets (12.5 mg total) by mouth daily.    Physical Exam:    VS:  BP 94/68 (BP Location: Left Arm, Patient Position: Sitting, Cuff Size: Large)   Pulse 93   Ht 6' 5 (1.956 m)   Wt 277 lb (125.6 kg)   SpO2 94%   BMI 32.85 kg/m    Wt Readings from Last 3 Encounters:  05/26/23 277 lb  (125.6 kg)  05/21/23 268 lb (121.6 kg)  05/08/23 254 lb 9.6 oz (115.5 kg)    GEN: Well nourished, appears uncomfortable in wheel chair, no acute distress  NECK: +JVD CARDIAC: RRR, no murmurs, rubs, gallops RESPIRATORY:  Crackles in bilateral bases  ABDOMEN: Soft, non-tender, distended EXTREMITIES:  +2 edema at thighs, ace wraps on lower extremities   Asessement and Plan:.    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  Right ventricular function was also moderately reduced.  PASP was 59.6 mmHg.  He was noted to have moderate mitral regurgitation and right atrial pressure was 15 mmHg.  With new LV thrombus unable to exclude possible infarct however unable to exclude cocaine induced cardiomyopathy.  Patient left AMA on 05/08/2023.  On chart review he has been in and [facility] of [facility] and [facility] ED with complaints of exertional dyspnea.  BNP on 05/16/2023 3165.  Most recently at [facility] ED on 05/21/2023, his Lasix  was increased to 40 mg daily.  Today he reports worsening shortness of breath, orthopnea, PND, lower extremity edema and scrotal edema.  He has had a weight gain of 23 pounds since 05/08/2023. He appears markedly fluid overloaded on exam. Blood pressure 94/68.  He reports that he discontinued his Eliquis  a few days ago as it made him feel uneasy. Discussed with Dr. Mona, DOD at [facility] office today, it was recommended that he present to the emergency department for further evaluation. EMS transport offered, patients daughter will transport to ED. Given numerous other medical conditions and frequent hospital visits would recommend triad hospitalist admission and cardiology consult.   Right abdominal pain/cholecystitis: Patient today reporting increased right abdominal pain. Admitted to [facility] in November  with cholecystitis.   CAD: S/p DES to proximal LAD and staged RCA in 2020.  Today he denies chest pain.   Polysubstance abuse: UDS during admission in 12/24 positive for cocaine, opioids and amphetamines.  Patient reported 30+ year history of cocaine use.  Today he reports that he has not used any substances.  Vasculitic skin lesions on lower extremity: Per notes from [facility] felt that he has prurigo nodules. Seen by Maralee this morning, ACE wraps applied.     Disposition: Present to the ED as discussed during OV. F/u with Dr. Court as scheduled.   Signed, Emagene Merfeld D Lansing Sigmon, NP

## 2023-05-25 NOTE — Telephone Encounter (Signed)
 Patient identification verified by 2 forms. Bertina Cooks, RN    Called and spoke to patient  Patient states SOB remains the same, unchanging with lasix  increase  Advised patient to keep 1/7 OV for follow up  Reviewed ED warning signs/precautions  Patient verbalized understanding, no questions at this time

## 2023-05-25 NOTE — Telephone Encounter (Signed)
 Evan Gess, MD  You2 days ago    We can wait until he's seen in the office on Tuesday

## 2023-05-26 ENCOUNTER — Other Ambulatory Visit: Payer: Self-pay

## 2023-05-26 ENCOUNTER — Inpatient Hospital Stay (HOSPITAL_COMMUNITY)
Admission: EM | Admit: 2023-05-26 | Discharge: 2023-06-03 | DRG: 291 | Disposition: A | Discharge status: Home Health | Payer: Medicaid Other | Attending: Internal Medicine | Admitting: Internal Medicine

## 2023-05-26 ENCOUNTER — Encounter: Payer: Self-pay | Admitting: Cardiology

## 2023-05-26 ENCOUNTER — Ambulatory Visit (INDEPENDENT_AMBULATORY_CARE_PROVIDER_SITE_OTHER): Payer: Medicaid Other | Admitting: Orthopedic Surgery

## 2023-05-26 ENCOUNTER — Ambulatory Visit: Payer: Medicaid Other | Attending: Cardiology | Admitting: Cardiology

## 2023-05-26 ENCOUNTER — Emergency Department (HOSPITAL_COMMUNITY): Payer: Medicaid Other

## 2023-05-26 ENCOUNTER — Encounter: Payer: Self-pay | Admitting: Physician Assistant

## 2023-05-26 ENCOUNTER — Encounter (HOSPITAL_COMMUNITY): Payer: Self-pay | Admitting: Emergency Medicine

## 2023-05-26 ENCOUNTER — Encounter: Payer: Self-pay | Admitting: Orthopedic Surgery

## 2023-05-26 VITALS — BP 94/68 | HR 93 | Ht 77.0 in | Wt 277.0 lb

## 2023-05-26 DIAGNOSIS — I255 Ischemic cardiomyopathy: Secondary | ICD-10-CM

## 2023-05-26 DIAGNOSIS — I493 Ventricular premature depolarization: Secondary | ICD-10-CM | POA: Diagnosis present

## 2023-05-26 DIAGNOSIS — I252 Old myocardial infarction: Secondary | ICD-10-CM

## 2023-05-26 DIAGNOSIS — I959 Hypotension, unspecified: Secondary | ICD-10-CM | POA: Diagnosis present

## 2023-05-26 DIAGNOSIS — Z7901 Long term (current) use of anticoagulants: Secondary | ICD-10-CM

## 2023-05-26 DIAGNOSIS — E11622 Type 2 diabetes mellitus with other skin ulcer: Secondary | ICD-10-CM | POA: Diagnosis present

## 2023-05-26 DIAGNOSIS — I513 Intracardiac thrombosis, not elsewhere classified: Secondary | ICD-10-CM

## 2023-05-26 DIAGNOSIS — E876 Hypokalemia: Secondary | ICD-10-CM | POA: Diagnosis present

## 2023-05-26 DIAGNOSIS — I5082 Biventricular heart failure: Secondary | ICD-10-CM

## 2023-05-26 DIAGNOSIS — F1910 Other psychoactive substance abuse, uncomplicated: Secondary | ICD-10-CM

## 2023-05-26 DIAGNOSIS — L84 Corns and callosities: Secondary | ICD-10-CM | POA: Diagnosis present

## 2023-05-26 DIAGNOSIS — I2510 Atherosclerotic heart disease of native coronary artery without angina pectoris: Secondary | ICD-10-CM

## 2023-05-26 DIAGNOSIS — Z7982 Long term (current) use of aspirin: Secondary | ICD-10-CM

## 2023-05-26 DIAGNOSIS — Z888 Allergy status to other drugs, medicaments and biological substances status: Secondary | ICD-10-CM

## 2023-05-26 DIAGNOSIS — Z7984 Long term (current) use of oral hypoglycemic drugs: Secondary | ICD-10-CM

## 2023-05-26 DIAGNOSIS — F1490 Cocaine use, unspecified, uncomplicated: Secondary | ICD-10-CM

## 2023-05-26 DIAGNOSIS — E782 Mixed hyperlipidemia: Secondary | ICD-10-CM | POA: Diagnosis present

## 2023-05-26 DIAGNOSIS — F1410 Cocaine abuse, uncomplicated: Secondary | ICD-10-CM | POA: Diagnosis present

## 2023-05-26 DIAGNOSIS — I152 Hypertension secondary to endocrine disorders: Secondary | ICD-10-CM

## 2023-05-26 DIAGNOSIS — R600 Localized edema: Secondary | ICD-10-CM

## 2023-05-26 DIAGNOSIS — E1159 Type 2 diabetes mellitus with other circulatory complications: Secondary | ICD-10-CM

## 2023-05-26 DIAGNOSIS — I272 Pulmonary hypertension, unspecified: Secondary | ICD-10-CM

## 2023-05-26 DIAGNOSIS — R0602 Shortness of breath: Secondary | ICD-10-CM | POA: Diagnosis not present

## 2023-05-26 DIAGNOSIS — I872 Venous insufficiency (chronic) (peripheral): Secondary | ICD-10-CM | POA: Diagnosis present

## 2023-05-26 DIAGNOSIS — I87333 Chronic venous hypertension (idiopathic) with ulcer and inflammation of bilateral lower extremity: Secondary | ICD-10-CM

## 2023-05-26 DIAGNOSIS — I5020 Unspecified systolic (congestive) heart failure: Secondary | ICD-10-CM

## 2023-05-26 DIAGNOSIS — E11628 Type 2 diabetes mellitus with other skin complications: Secondary | ICD-10-CM | POA: Diagnosis present

## 2023-05-26 DIAGNOSIS — I5042 Chronic combined systolic (congestive) and diastolic (congestive) heart failure: Secondary | ICD-10-CM | POA: Diagnosis not present

## 2023-05-26 DIAGNOSIS — I509 Heart failure, unspecified: Secondary | ICD-10-CM | POA: Diagnosis not present

## 2023-05-26 DIAGNOSIS — N179 Acute kidney failure, unspecified: Secondary | ICD-10-CM | POA: Diagnosis present

## 2023-05-26 DIAGNOSIS — I25118 Atherosclerotic heart disease of native coronary artery with other forms of angina pectoris: Secondary | ICD-10-CM

## 2023-05-26 DIAGNOSIS — I5023 Acute on chronic systolic (congestive) heart failure: Secondary | ICD-10-CM | POA: Diagnosis present

## 2023-05-26 DIAGNOSIS — Z8249 Family history of ischemic heart disease and other diseases of the circulatory system: Secondary | ICD-10-CM

## 2023-05-26 DIAGNOSIS — I5021 Acute systolic (congestive) heart failure: Secondary | ICD-10-CM | POA: Diagnosis present

## 2023-05-26 DIAGNOSIS — I472 Ventricular tachycardia, unspecified: Secondary | ICD-10-CM | POA: Diagnosis present

## 2023-05-26 DIAGNOSIS — I776 Arteritis, unspecified: Secondary | ICD-10-CM

## 2023-05-26 DIAGNOSIS — I428 Other cardiomyopathies: Secondary | ICD-10-CM | POA: Diagnosis present

## 2023-05-26 DIAGNOSIS — L97929 Non-pressure chronic ulcer of unspecified part of left lower leg with unspecified severity: Secondary | ICD-10-CM | POA: Diagnosis present

## 2023-05-26 DIAGNOSIS — Z803 Family history of malignant neoplasm of breast: Secondary | ICD-10-CM

## 2023-05-26 DIAGNOSIS — G4733 Obstructive sleep apnea (adult) (pediatric): Secondary | ICD-10-CM

## 2023-05-26 DIAGNOSIS — K8020 Calculus of gallbladder without cholecystitis without obstruction: Secondary | ICD-10-CM | POA: Diagnosis present

## 2023-05-26 DIAGNOSIS — Z955 Presence of coronary angioplasty implant and graft: Secondary | ICD-10-CM

## 2023-05-26 DIAGNOSIS — Z91128 Patient's intentional underdosing of medication regimen for other reason: Secondary | ICD-10-CM

## 2023-05-26 DIAGNOSIS — Z5986 Financial insecurity: Secondary | ICD-10-CM

## 2023-05-26 DIAGNOSIS — F419 Anxiety disorder, unspecified: Secondary | ICD-10-CM | POA: Diagnosis present

## 2023-05-26 DIAGNOSIS — Z79899 Other long term (current) drug therapy: Secondary | ICD-10-CM

## 2023-05-26 DIAGNOSIS — Z6832 Body mass index (BMI) 32.0-32.9, adult: Secondary | ICD-10-CM

## 2023-05-26 DIAGNOSIS — E1169 Type 2 diabetes mellitus with other specified complication: Secondary | ICD-10-CM | POA: Diagnosis present

## 2023-05-26 DIAGNOSIS — I4729 Other ventricular tachycardia: Secondary | ICD-10-CM

## 2023-05-26 DIAGNOSIS — I10 Essential (primary) hypertension: Secondary | ICD-10-CM | POA: Diagnosis present

## 2023-05-26 DIAGNOSIS — I890 Lymphedema, not elsewhere classified: Secondary | ICD-10-CM

## 2023-05-26 DIAGNOSIS — Z823 Family history of stroke: Secondary | ICD-10-CM

## 2023-05-26 DIAGNOSIS — Z833 Family history of diabetes mellitus: Secondary | ICD-10-CM

## 2023-05-26 DIAGNOSIS — L281 Prurigo nodularis: Secondary | ICD-10-CM | POA: Diagnosis present

## 2023-05-26 DIAGNOSIS — I480 Paroxysmal atrial fibrillation: Secondary | ICD-10-CM | POA: Diagnosis present

## 2023-05-26 DIAGNOSIS — L97919 Non-pressure chronic ulcer of unspecified part of right lower leg with unspecified severity: Secondary | ICD-10-CM | POA: Diagnosis present

## 2023-05-26 DIAGNOSIS — I340 Nonrheumatic mitral (valve) insufficiency: Secondary | ICD-10-CM | POA: Diagnosis present

## 2023-05-26 DIAGNOSIS — I5084 End stage heart failure: Secondary | ICD-10-CM | POA: Diagnosis present

## 2023-05-26 DIAGNOSIS — I110 Hypertensive heart disease with heart failure: Secondary | ICD-10-CM | POA: Diagnosis not present

## 2023-05-26 DIAGNOSIS — I517 Cardiomegaly: Secondary | ICD-10-CM | POA: Diagnosis not present

## 2023-05-26 DIAGNOSIS — E669 Obesity, unspecified: Secondary | ICD-10-CM | POA: Diagnosis present

## 2023-05-26 DIAGNOSIS — Z811 Family history of alcohol abuse and dependence: Secondary | ICD-10-CM

## 2023-05-26 DIAGNOSIS — E871 Hypo-osmolality and hyponatremia: Secondary | ICD-10-CM | POA: Diagnosis present

## 2023-05-26 DIAGNOSIS — K761 Chronic passive congestion of liver: Secondary | ICD-10-CM | POA: Diagnosis present

## 2023-05-26 DIAGNOSIS — Y92009 Unspecified place in unspecified non-institutional (private) residence as the place of occurrence of the external cause: Secondary | ICD-10-CM

## 2023-05-26 DIAGNOSIS — T490X6A Underdosing of local antifungal, anti-infective and anti-inflammatory drugs, initial encounter: Secondary | ICD-10-CM | POA: Diagnosis present

## 2023-05-26 LAB — CBC
HCT: 45.7 % (ref 39.0–52.0)
Hemoglobin: 14.6 g/dL (ref 13.0–17.0)
MCH: 26.4 pg (ref 26.0–34.0)
MCHC: 31.9 g/dL (ref 30.0–36.0)
MCV: 82.8 fL (ref 80.0–100.0)
Platelets: 280 10*3/uL (ref 150–400)
RBC: 5.52 MIL/uL (ref 4.22–5.81)
RDW: 15.6 % — ABNORMAL HIGH (ref 11.5–15.5)
WBC: 9.8 10*3/uL (ref 4.0–10.5)
nRBC: 0 % (ref 0.0–0.2)

## 2023-05-26 LAB — BASIC METABOLIC PANEL
Anion gap: 10 (ref 5–15)
BUN: 32 mg/dL — ABNORMAL HIGH (ref 6–20)
CO2: 22 mmol/L (ref 22–32)
Calcium: 8.2 mg/dL — ABNORMAL LOW (ref 8.9–10.3)
Chloride: 101 mmol/L (ref 98–111)
Creatinine, Ser: 1.35 mg/dL — ABNORMAL HIGH (ref 0.61–1.24)
GFR, Estimated: >60 mL/min (ref >60)
Glucose, Bld: 174 mg/dL — ABNORMAL HIGH (ref 70–99)
Potassium: 4.5 mmol/L (ref 3.5–5.1)
Sodium: 133 mmol/L — ABNORMAL LOW (ref 135–145)

## 2023-05-26 LAB — BRAIN NATRIURETIC PEPTIDE: B Natriuretic Peptide: 1354.6 pg/mL — ABNORMAL HIGH (ref 0.0–100.0)

## 2023-05-26 NOTE — Progress Notes (Signed)
 Covering cardmaster today. Received sign out from [facility] NP that patient being sent to ED for concern for volume overload. Per her report, she discussed with Dr. Mona and recommendation was to send to ED, not a direct admit (seen by APP only). I relayed this information to our on call APP/MD to be aware if cardiology is called for assistance with management.

## 2023-05-26 NOTE — Progress Notes (Signed)
 Office Visit Note   Patient: Evan [name]           Date of Birth: [birth year]           MRN: [id number] Visit Date: 05/26/2023              Requested by: Dettinger, Fonda LABOR, MD [address] [place],  [place] [address] PCP: Dettinger, Fonda LABOR, MD  Chief Complaint  Patient presents with   Right Leg - Wound Check   Left Leg - Wound Check      HPI: Patient is a 58 year old gentleman who was seen for initial evaluation for bilateral lower extremity ulcers with pitting edema of both legs.  Patient states he has congestive heart failure his ejection fraction is reviewed and 20%.  Patient states he has been in and out of the hospitals 5 times over the past 2 months and has received IV antibiotics and Lasix .  His last hemoglobin A1c was 8.2.  Patient states the multiple ulcers have been present for several months.  Assessment & Plan: Visit Diagnoses:  1. 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 Chronic combined systolic and diastolic congestive heart failure (HCC)     Plan: Will apply Dynaflex compression wraps to both lower extremities and cleanse the wounds with Vashe.  Patient would benefit from lymphedema pumps we will start the authorization process.  Follow-Up Instructions: Return in about 1 week (around 06/02/2023).   Ortho Exam  Patient is alert, oriented, no adenopathy, well-dressed, normal affect, normal respiratory effort. Examination patient has pitting edema of the thighs and legs.  Patient does not have palpable pulses.  The Doppler was used and he has good multiphasic flow both dorsalis pedis and posterior tibial bilaterally.  Patient has multiple ischemic ulcers on both legs secondary to the swelling.  There is no cellulitis no drainage no calciphylaxis.  Patient's most recent prealbumin is 12.  Patient has limb threatening ulcerations both lower extremities  Imaging: No results  found.     Labs: Lab Results  Component Value Date   HGBA1C 7.3 (H) 04/19/2023   HGBA1C 7.4 (H) 02/11/2023   HGBA1C 7.6 (H) 11/10/2022   ESRSEDRATE 3 05/05/2023   ESRSEDRATE 1 05/05/2023   CRP 2.0 (H) 05/05/2023   CRP 2.5 (H) 05/05/2023   LABURIC 7.1 10/12/2019   LABURIC 6.1 11/24/2018   LABURIC 5.9 02/04/2017   REPTSTATUS 05/10/2023 FINAL 05/05/2023   CULT  05/05/2023    NO GROWTH 5 DAYS Performed at [facility], [address] [place], [place] [address]      Lab Results  Component Value Date   ALBUMIN 3.8 05/21/2023   ALBUMIN 3.0 (L) 05/06/2023   ALBUMIN 3.4 (L) 05/04/2023   PREALBUMIN 12 (L) 05/05/2023    Lab Results  Component Value Date   MG 2.1 05/07/2023   MG 2.0 05/06/2023   MG 2.1 04/19/2023   No results found for: [facility]  Lab Results  Component Value Date   PREALBUMIN 12 (L) 05/05/2023      Latest Ref Rng & Units 05/21/2023   12:39 PM 05/08/2023    5:16 AM 05/07/2023    4:34 AM  CBC EXTENDED  WBC 3.4 - 10.8 x10E3/uL 10.7  8.4  8.6   RBC 4.14 - 5.80 x10E6/uL 5.57  5.27  5.55   Hemoglobin 13.0 - 17.7 g/dL 84.8  85.4  84.5   HCT 37.5 - 51.0 % 48.4  45.6  48.2   Platelets 150 - 450 x10E3/uL 258  257  303   NEUT# 1.4 - 7.0 x10E3/uL 7.9     Lymph# 0.7 - 3.1 x10E3/uL 1.7        There is no height or weight on file to calculate BMI.  Orders:  No orders of the defined types were placed in this encounter.  No orders of the defined types were placed in this encounter.    Procedures: No procedures performed  Clinical Data: No additional findings.  ROS:  All other systems negative, except as noted in the HPI. Review of Systems  Objective: Vital Signs: There were no vitals taken for this visit.  Specialty Comments:  No specialty comments available.  PMFS History: Patient Active Problem List   Diagnosis Date Noted   Diabetic foot ulcer (HCC) 05/05/2023   History of acute cholecystitis 04/18/23 treated conservatively  05/05/2023   Acute on chronic HFrEF (heart failure with reduced ejection fraction) (HCC) 05/05/2023   Diabetic ulcer of lower leg (HCC) 05/05/2023   Cellulitis in diabetic foot (HCC) 05/05/2023   CHF (congestive heart failure) (HCC) 05/05/2023   Bilateral leg ulcer (HCC) 05/05/2023   Acute on chronic combined systolic and diastolic CHF (congestive heart failure) (HCC) 05/05/2023   Vasculitis (HCC) 05/05/2023   Abdominal pain 04/19/2023   Elevated brain natriuretic peptide (BNP) level 04/19/2023   Elevated troponin 04/19/2023   Elevated d-dimer 04/19/2023   Type 2 diabetes mellitus with hyperglycemia (HCC) 04/19/2023   Acute cholecystitis 04/18/2023   Hypertension 10/06/2022   Type 2 diabetes mellitus with complications (HCC) 02/15/2021   CAD S/P PCI 12/09/2018   Left ventricular dysfunction    History of non-ST elevation myocardial infarction (NSTEMI) 11/24/2018   Obesity (BMI 30.0-34.9) 01/05/2018   GERD (gastroesophageal reflux disease) 07/08/2017   Mixed hyperlipidemia 12/22/2016   Essential hypertension 09/22/2016   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) 09/22/2016   Gout 09/22/2016   Past Medical History:  Diagnosis Date   Arthritis    Atrial fibrillation (HCC)    CHF (congestive heart failure) (HCC)    Diabetes mellitus without complication (HCC)    Gout    Heart attack (HCC) 10/2018   Hyperlipidemia    Hypertension    Morbid obesity (HCC) 12/22/2016   Sleep apnea    had test twice unable to complete    Family History  Problem Relation Age of Onset   Diabetes Mother    Stroke Mother    Early death Father    Heart attack Father        died from heart attack at the age of 54   Alcohol abuse Father    Heart disease Father    Breast cancer Sister    Hypertension Brother    Heart attack Maternal Grandfather        died from heart attack at the age of 70   Early death Paternal Grandfather    Heart attack Paternal Grandfather    Colon cancer Neg Hx     Esophageal cancer Neg Hx    Rectal cancer Neg Hx    Stomach cancer Neg Hx     Past Surgical History:  Procedure Laterality Date   ANKLE ARTHROSCOPY Right    COLONOSCOPY WITH PROPOFOL  N/A 09/18/2020   Procedure: COLONOSCOPY WITH PROPOFOL ;  Surgeon: Legrand Victory LITTIE DOUGLAS, MD;  Location: [facility];  Service: Gastroenterology;  Laterality: N/A;  09-18-2020   CORONARY STENT INTERVENTION N/A 11/24/2018   Procedure:  CORONARY STENT INTERVENTION;  Surgeon: Darron Deatrice LABOR, MD;  Location: [facility];  Service: Cardiovascular;  Laterality: N/A;  prox and mid LAD   CORONARY STENT INTERVENTION N/A 11/26/2018   Procedure: CORONARY STENT INTERVENTION;  Surgeon: Darron Deatrice LABOR, MD;  Location: [facility];  Service: Cardiovascular;  Laterality: N/A;   CYST REMOVAL HAND     KNEE ARTHROSCOPY Left    LEFT HEART CATH AND CORONARY ANGIOGRAPHY N/A 11/24/2018   Procedure: LEFT HEART CATH AND CORONARY ANGIOGRAPHY;  Surgeon: Darron Deatrice LABOR, MD;  Location: [facility];  Service: Cardiovascular;  Laterality: N/A;   POLYPECTOMY  09/18/2020   Procedure: POLYPECTOMY;  Surgeon: Legrand Victory LITTIE DOUGLAS, MD;  Location: [facility];  Service: Gastroenterology;;   Social History   Occupational History   Not on file  Tobacco Use   Smoking status: Never   Smokeless tobacco: Never  Vaping Use   Vaping status: Never Used  Substance and Sexual Activity   Alcohol use: Yes    Alcohol/week: 12.0 standard drinks of alcohol    Types: 12 Cans of beer per week    Comment: 5 beers a week   Drug use: No   Sexual activity: Yes    Comment: male 8 months partner

## 2023-05-26 NOTE — ED Triage Notes (Signed)
 PT send by PCP for evaluation of bilateral leg swelling, groin swelling and CHF. Pt states gained 24 lbs in 1 month. Pt also complains of SOB

## 2023-05-26 NOTE — Patient Instructions (Signed)
 Medication Instructions:  Start Eliquis  5 mg twice a day *If you need a refill on your cardiac medications before your next appointment, please call your pharmacy*  Lab Work: No labs  Testing/Procedures: No testing  Follow-Up: At [facility], you and your health needs are our priority.  As part of our continuing mission to provide you with exceptional heart care, we have created designated Provider Care Teams.  These Care Teams include your primary Cardiologist (physician) and Advanced Practice Providers (APPs -  Physician Assistants and Nurse Practitioners) who all work together to provide you with the care you need, when you need it.  We recommend signing up for the patient portal called MyChart.  Sign up information is provided on this After Visit Summary.  MyChart is used to connect with patients for Virtual Visits (Telemedicine).  Patients are able to view lab/test results, encounter notes, upcoming appointments, etc.  Non-urgent messages can be sent to your provider as well.   To learn more about what you can do with MyChart, go to [email or web address].    Other Instructions Heading to the emergency room

## 2023-05-27 ENCOUNTER — Other Ambulatory Visit: Payer: Self-pay | Admitting: Orthopedic Surgery

## 2023-05-27 ENCOUNTER — Other Ambulatory Visit (HOSPITAL_COMMUNITY): Payer: Self-pay

## 2023-05-27 DIAGNOSIS — E11622 Type 2 diabetes mellitus with other skin ulcer: Secondary | ICD-10-CM | POA: Diagnosis not present

## 2023-05-27 DIAGNOSIS — I110 Hypertensive heart disease with heart failure: Secondary | ICD-10-CM | POA: Diagnosis not present

## 2023-05-27 DIAGNOSIS — E871 Hypo-osmolality and hyponatremia: Secondary | ICD-10-CM | POA: Diagnosis not present

## 2023-05-27 DIAGNOSIS — E876 Hypokalemia: Secondary | ICD-10-CM | POA: Diagnosis not present

## 2023-05-27 DIAGNOSIS — E782 Mixed hyperlipidemia: Secondary | ICD-10-CM | POA: Diagnosis not present

## 2023-05-27 DIAGNOSIS — N179 Acute kidney failure, unspecified: Secondary | ICD-10-CM | POA: Diagnosis not present

## 2023-05-27 DIAGNOSIS — E1169 Type 2 diabetes mellitus with other specified complication: Secondary | ICD-10-CM | POA: Diagnosis not present

## 2023-05-27 DIAGNOSIS — I517 Cardiomegaly: Secondary | ICD-10-CM | POA: Diagnosis not present

## 2023-05-27 DIAGNOSIS — I5084 End stage heart failure: Secondary | ICD-10-CM | POA: Diagnosis not present

## 2023-05-27 DIAGNOSIS — I5023 Acute on chronic systolic (congestive) heart failure: Secondary | ICD-10-CM | POA: Diagnosis not present

## 2023-05-27 DIAGNOSIS — I776 Arteritis, unspecified: Secondary | ICD-10-CM | POA: Diagnosis not present

## 2023-05-27 DIAGNOSIS — I272 Pulmonary hypertension, unspecified: Secondary | ICD-10-CM | POA: Diagnosis not present

## 2023-05-27 DIAGNOSIS — L97929 Non-pressure chronic ulcer of unspecified part of left lower leg with unspecified severity: Secondary | ICD-10-CM | POA: Diagnosis not present

## 2023-05-27 DIAGNOSIS — R600 Localized edema: Secondary | ICD-10-CM | POA: Diagnosis not present

## 2023-05-27 DIAGNOSIS — I428 Other cardiomyopathies: Secondary | ICD-10-CM | POA: Diagnosis not present

## 2023-05-27 DIAGNOSIS — I255 Ischemic cardiomyopathy: Secondary | ICD-10-CM | POA: Diagnosis not present

## 2023-05-27 DIAGNOSIS — Z6832 Body mass index (BMI) 32.0-32.9, adult: Secondary | ICD-10-CM | POA: Diagnosis not present

## 2023-05-27 DIAGNOSIS — Z7901 Long term (current) use of anticoagulants: Secondary | ICD-10-CM | POA: Diagnosis not present

## 2023-05-27 DIAGNOSIS — I509 Heart failure, unspecified: Secondary | ICD-10-CM | POA: Diagnosis not present

## 2023-05-27 DIAGNOSIS — I4729 Other ventricular tachycardia: Secondary | ICD-10-CM | POA: Diagnosis not present

## 2023-05-27 DIAGNOSIS — I513 Intracardiac thrombosis, not elsewhere classified: Secondary | ICD-10-CM | POA: Diagnosis not present

## 2023-05-27 DIAGNOSIS — I5021 Acute systolic (congestive) heart failure: Secondary | ICD-10-CM | POA: Diagnosis not present

## 2023-05-27 DIAGNOSIS — I472 Ventricular tachycardia, unspecified: Secondary | ICD-10-CM | POA: Diagnosis not present

## 2023-05-27 DIAGNOSIS — I87333 Chronic venous hypertension (idiopathic) with ulcer and inflammation of bilateral lower extremity: Secondary | ICD-10-CM | POA: Diagnosis not present

## 2023-05-27 DIAGNOSIS — F1490 Cocaine use, unspecified, uncomplicated: Secondary | ICD-10-CM | POA: Diagnosis not present

## 2023-05-27 DIAGNOSIS — I340 Nonrheumatic mitral (valve) insufficiency: Secondary | ICD-10-CM | POA: Diagnosis not present

## 2023-05-27 DIAGNOSIS — I5082 Biventricular heart failure: Secondary | ICD-10-CM | POA: Diagnosis not present

## 2023-05-27 DIAGNOSIS — I890 Lymphedema, not elsewhere classified: Secondary | ICD-10-CM | POA: Diagnosis not present

## 2023-05-27 DIAGNOSIS — F1410 Cocaine abuse, uncomplicated: Secondary | ICD-10-CM | POA: Diagnosis present

## 2023-05-27 DIAGNOSIS — Y92009 Unspecified place in unspecified non-institutional (private) residence as the place of occurrence of the external cause: Secondary | ICD-10-CM | POA: Diagnosis not present

## 2023-05-27 DIAGNOSIS — I480 Paroxysmal atrial fibrillation: Secondary | ICD-10-CM | POA: Diagnosis not present

## 2023-05-27 DIAGNOSIS — R0602 Shortness of breath: Secondary | ICD-10-CM | POA: Diagnosis not present

## 2023-05-27 DIAGNOSIS — L97919 Non-pressure chronic ulcer of unspecified part of right lower leg with unspecified severity: Secondary | ICD-10-CM | POA: Diagnosis not present

## 2023-05-27 DIAGNOSIS — K761 Chronic passive congestion of liver: Secondary | ICD-10-CM | POA: Diagnosis not present

## 2023-05-27 LAB — RAPID URINE DRUG SCREEN, HOSP PERFORMED
Amphetamines: NOT DETECTED
Barbiturates: NOT DETECTED
Benzodiazepines: NOT DETECTED
Cocaine: POSITIVE — AB
Opiates: NOT DETECTED
Tetrahydrocannabinol: NOT DETECTED

## 2023-05-27 LAB — CBC WITH DIFFERENTIAL/PLATELET
Abs Immature Granulocytes: 0.05 10*3/uL (ref 0.00–0.07)
Basophils Absolute: 0.1 10*3/uL (ref 0.0–0.1)
Basophils Relative: 1 %
Eosinophils Absolute: 0.1 10*3/uL (ref 0.0–0.5)
Eosinophils Relative: 0 %
HCT: 48.5 % (ref 39.0–52.0)
Hemoglobin: 15.8 g/dL (ref 13.0–17.0)
Immature Granulocytes: 0 %
Lymphocytes Relative: 13 %
Lymphs Abs: 1.6 10*3/uL (ref 0.7–4.0)
MCH: 26.6 pg (ref 26.0–34.0)
MCHC: 32.6 g/dL (ref 30.0–36.0)
MCV: 81.8 fL (ref 80.0–100.0)
Monocytes Absolute: 0.9 10*3/uL (ref 0.1–1.0)
Monocytes Relative: 7 %
Neutro Abs: 9.8 10*3/uL — ABNORMAL HIGH (ref 1.7–7.7)
Neutrophils Relative %: 79 %
Platelets: 302 10*3/uL (ref 150–400)
RBC: 5.93 MIL/uL — ABNORMAL HIGH (ref 4.22–5.81)
RDW: 15.9 % — ABNORMAL HIGH (ref 11.5–15.5)
WBC: 12.5 10*3/uL — ABNORMAL HIGH (ref 4.0–10.5)
nRBC: 0 % (ref 0.0–0.2)

## 2023-05-27 LAB — COMPREHENSIVE METABOLIC PANEL
ALT: 94 U/L — ABNORMAL HIGH (ref 0–44)
AST: 71 U/L — ABNORMAL HIGH (ref 15–41)
Albumin: 3 g/dL — ABNORMAL LOW (ref 3.5–5.0)
Alkaline Phosphatase: 88 U/L (ref 38–126)
Anion gap: 10 (ref 5–15)
BUN: 34 mg/dL — ABNORMAL HIGH (ref 6–20)
CO2: 23 mmol/L (ref 22–32)
Calcium: 8.5 mg/dL — ABNORMAL LOW (ref 8.9–10.3)
Chloride: 97 mmol/L — ABNORMAL LOW (ref 98–111)
Creatinine, Ser: 1.49 mg/dL — ABNORMAL HIGH (ref 0.61–1.24)
GFR, Estimated: 54 mL/min — ABNORMAL LOW (ref >60)
Glucose, Bld: 210 mg/dL — ABNORMAL HIGH (ref 70–99)
Potassium: 4.5 mmol/L (ref 3.5–5.1)
Sodium: 130 mmol/L — ABNORMAL LOW (ref 135–145)
Total Bilirubin: 1 mg/dL (ref 0.0–1.2)
Total Protein: 6.5 g/dL (ref 6.5–8.1)

## 2023-05-27 LAB — HIV ANTIBODY (ROUTINE TESTING W REFLEX): HIV Screen 4th Generation wRfx: NONREACTIVE

## 2023-05-27 LAB — MAGNESIUM: Magnesium: 2.2 mg/dL (ref 1.7–2.4)

## 2023-05-27 LAB — TROPONIN I (HIGH SENSITIVITY)
Troponin I (High Sensitivity): 39 ng/L — ABNORMAL HIGH (ref <18)
Troponin I (High Sensitivity): 42 ng/L — ABNORMAL HIGH (ref <18)

## 2023-05-27 MED ORDER — ACETAMINOPHEN 325 MG PO TABS
650.0000 mg | ORAL_TABLET | ORAL | Status: DC | PRN
  Administered 2023-05-27 – 2023-06-02 (×13): 650 mg via ORAL
  Filled 2023-05-27 (×13): qty 2

## 2023-05-27 MED ORDER — FLUTICASONE PROPIONATE 50 MCG/ACT NA SUSP
2.0000 | Freq: Every day | NASAL | Status: AC
  Administered 2023-05-28 – 2023-05-29 (×3): 2 via NASAL
  Filled 2023-05-27: qty 16

## 2023-05-27 MED ORDER — ONDANSETRON HCL 4 MG/2ML IJ SOLN: 4.0000 mg | Freq: Four times a day (QID) | INTRAMUSCULAR | Status: DC | PRN

## 2023-05-27 MED ORDER — FENOFIBRATE 160 MG PO TABS
160.0000 mg | ORAL_TABLET | Freq: Every day | ORAL | Status: DC
  Administered 2023-05-27 – 2023-06-03 (×8): 160 mg via ORAL
  Filled 2023-05-27 (×9): qty 1

## 2023-05-27 MED ORDER — APIXABAN 5 MG PO TABS
5.0000 mg | ORAL_TABLET | Freq: Two times a day (BID) | ORAL | Status: DC
  Administered 2023-05-27 – 2023-06-03 (×15): 5 mg via ORAL
  Filled 2023-05-27 (×15): qty 1

## 2023-05-27 MED ORDER — ASPIRIN 81 MG PO TBEC
81.0000 mg | DELAYED_RELEASE_TABLET | Freq: Every day | ORAL | Status: DC
  Administered 2023-05-28 – 2023-06-03 (×7): 81 mg via ORAL
  Filled 2023-05-27 (×7): qty 1

## 2023-05-27 MED ORDER — SPIRONOLACTONE 12.5 MG HALF TABLET
12.5000 mg | ORAL_TABLET | Freq: Every day | ORAL | Status: DC
  Administered 2023-05-27 – 2023-05-28 (×2): 12.5 mg via ORAL
  Filled 2023-05-27 (×2): qty 1

## 2023-05-27 MED ORDER — FAMOTIDINE 20 MG PO TABS
20.0000 mg | ORAL_TABLET | Freq: Two times a day (BID) | ORAL | Status: DC
  Administered 2023-05-27 – 2023-06-03 (×15): 20 mg via ORAL
  Filled 2023-05-27 (×15): qty 1

## 2023-05-27 MED ORDER — FUROSEMIDE 10 MG/ML IJ SOLN
40.0000 mg | Freq: Once | INTRAMUSCULAR | Status: AC
  Administered 2023-05-27: 40 mg via INTRAVENOUS
  Filled 2023-05-27: qty 4

## 2023-05-27 MED ORDER — PANTOPRAZOLE SODIUM 40 MG PO TBEC
40.0000 mg | DELAYED_RELEASE_TABLET | Freq: Every day | ORAL | Status: DC
  Administered 2023-05-27 – 2023-06-03 (×8): 40 mg via ORAL
  Filled 2023-05-27: qty 2
  Filled 2023-05-27 (×3): qty 1
  Filled 2023-05-27: qty 2
  Filled 2023-05-27 (×4): qty 1

## 2023-05-27 MED ORDER — HYDROXYZINE HCL 25 MG PO TABS
25.0000 mg | ORAL_TABLET | Freq: Once | ORAL | Status: AC
  Administered 2023-05-27: 25 mg via ORAL
  Filled 2023-05-27: qty 1

## 2023-05-27 MED ORDER — CARVEDILOL 6.25 MG PO TABS
6.2500 mg | ORAL_TABLET | Freq: Two times a day (BID) | ORAL | Status: DC
  Administered 2023-05-27 – 2023-05-28 (×2): 6.25 mg via ORAL
  Filled 2023-05-27: qty 2
  Filled 2023-05-27 (×2): qty 1

## 2023-05-27 MED ORDER — LOSARTAN POTASSIUM 25 MG PO TABS
25.0000 mg | ORAL_TABLET | Freq: Every day | ORAL | Status: DC
  Administered 2023-05-27: 25 mg via ORAL
  Filled 2023-05-27: qty 1

## 2023-05-27 MED ORDER — HYDROXYZINE HCL 10 MG PO TABS
10.0000 mg | ORAL_TABLET | ORAL | Status: AC
  Administered 2023-05-27: 10 mg via ORAL
  Filled 2023-05-27: qty 1

## 2023-05-27 MED ORDER — EMPAGLIFLOZIN 25 MG PO TABS
25.0000 mg | ORAL_TABLET | Freq: Every day | ORAL | Status: DC
  Administered 2023-05-27 – 2023-06-03 (×8): 25 mg via ORAL
  Filled 2023-05-27 (×8): qty 1

## 2023-05-27 MED ORDER — SODIUM CHLORIDE 0.9% FLUSH
3.0000 mL | Freq: Two times a day (BID) | INTRAVENOUS | Status: DC
  Administered 2023-05-27 – 2023-06-02 (×11): 3 mL via INTRAVENOUS

## 2023-05-27 MED ORDER — ONDANSETRON HCL 4 MG PO TABS: 4.0000 mg | ORAL_TABLET | Freq: Four times a day (QID) | ORAL | Status: DC | PRN

## 2023-05-27 MED ORDER — FUROSEMIDE 10 MG/ML IJ SOLN
60.0000 mg | Freq: Two times a day (BID) | INTRAMUSCULAR | Status: DC
  Filled 2023-05-27 (×2): qty 6

## 2023-05-27 MED ORDER — ATORVASTATIN CALCIUM 80 MG PO TABS
80.0000 mg | ORAL_TABLET | Freq: Every day | ORAL | Status: DC
  Administered 2023-05-27 – 2023-05-28 (×2): 80 mg via ORAL
  Filled 2023-05-27: qty 1
  Filled 2023-05-27: qty 2

## 2023-05-27 NOTE — ED Provider Notes (Signed)
 Care assumed from [facility], PA-C at shift change pending admission.  See her note for full HPI.  In short, patient is a 58 year old male who was sent by cardiology for admission for CHF exacerbation.  Patient admits to shortness of breath and orthopnea. Physical Exam  BP (!) 113/92 (BP Location: Right Arm)   Pulse (!) 104   Temp 97.6 F (36.4 C) (Oral)   Resp 16   Wt 125.6 kg   SpO2 99%   BMI 32.85 kg/m   Physical Exam Vitals and nursing note reviewed.  Constitutional:      General: He is not in acute distress.    Appearance: He is not ill-appearing.  HENT:     Head: Normocephalic.  Eyes:     Pupils: Pupils are equal, round, and reactive to light.  Cardiovascular:     Rate and Rhythm: Normal rate and regular rhythm.     Pulses: Normal pulses.     Heart sounds: Normal heart sounds. No murmur heard.    No friction rub. No gallop.  Pulmonary:     Effort: Pulmonary effort is normal.     Breath sounds: Normal breath sounds.  Abdominal:     General: Abdomen is flat. There is no distension.     Palpations: Abdomen is soft.     Tenderness: There is no abdominal tenderness. There is no guarding or rebound.  Musculoskeletal:        General: Normal range of motion.     Cervical back: Neck supple.  Skin:    General: Skin is warm and dry.  Neurological:     General: No focal deficit present.     Mental Status: He is alert.  Psychiatric:        Mood and Affect: Mood normal.        Behavior: Behavior normal.     Procedures  Procedures  ED Course / MDM   Clinical Course as of 05/27/23 0631  Wed May 27, 2023  0559 Cardioology consulted at this time  [RR]    Clinical Course User Index [RR] Bernis Ernst, PA-C   Medical Decision Making Amount and/or Complexity of Data Reviewed Labs: ordered. Radiology: ordered.  Risk Prescription drug management. Decision regarding hospitalization.   7:26 AM reassessed patient at bedside.  Patient admits to orthopnea.  On room  air.  requesting hydroxyzine  for anxiety. Order placed.   7:58 AM Discussed with Isaiah with [facility] who agrees to admit patient.        Lorelle Aleck BROCKS, PA-C 05/27/23 [id number]    Melvenia Motto, MD 06/01/23 [phone]

## 2023-05-27 NOTE — H&P (Signed)
 History and Physical    Patient: Evan [name] FMW:[id number] DOB: 05-Jan-1966 DOA: 05/26/2023 DOS: the patient was seen and examined on 05/27/2023 PCP: Dettinger, Fonda LABOR, MD  Patient coming from: Home Medical readiness/disposition: Anticipate patient will be ready for discharge by 05/31/2023.  He will return to home environment.  He may benefit from the addition of home health services as well.  Chief Complaint:  Chief Complaint  Patient presents with   Leg Swelling   HPI: Evan [name] is a 58 y.o. male with medical history significant of hypertension, dyslipidemia, diabetes, CAD with anterior lateral MI in 2020.  He underwent intervention to the proximal LAD and staged RCA intervention.  He has ischemic cardiomyopathy with an LVEF of 20 to 25% at time of MI with follow-up LVEF 35 to 40%.  He also has a known LV thrombus and has been prescribed Eliquis .  Since the MI patient has a history of multiple presentations to the ER for heart failure symptoms.  Details regarding these admissions can be found in the cardiology office note dictated on 05/26/2023.  Most recently he presented to [facility] ED on 1/2 with complaints of exertional dyspnea.  His Lasix  was increased to 40 mg for 1 week and then he was instructed to go back to his regular dosing.  He followed up with the cardiologist in the office on 1/7 reporting increased shortness of breath and lower extremity edema.  He is also complaining of right abdominal pain in context of known cholelithiasis.  He reported Gress of weight gain as well but reports previous scrotal edema that was noted on 1/1 has improved after the Lasix  was adjusted.  Upon my questioning of him today he reported that several days ago he took the only carvedilol  he had in the house which was a 12.5 mg noting his dose was supposed to be 3.125 mg, he subsequently developed dizziness and was not sure if the carvedilol  or the Eliquis  caused this so he stopped both.  Because of the  symptoms he presented to the ED for further evaluation and treatment.  In the ED he had marked elevation in BNP 1355.  Chest x-ray at presentation showed borderline cardiomegaly but no acute pulmonary findings.  He was hemodynamically stable and not hypoxemic at rest.  He was noted to have significant lower extremity edema extending up the thighs to the hip region.  He has subsequently been given a dose of IV Lasix  40 mg in the ED and has been diuresing adequately.  It is noted when he is sleeping that his heart rate goes in the 40s.  He is having frequent unifocal PVCs and occasional coupling.  His potassium level was normal at 4.5.  Magnesium  is pending.  His troponin is elevated at 39.  He is denying chest pain.  His initial sodium was 133, glucose was 174, BUN 32 and a creatinine of 1.35.  LFTs were not obtained, white count was normal and platelets were normal.  Hospital service has been asked to evaluate the patient for admission.  Review of Systems: Patient reports he is followed by Dr. Harden in regards to lower extremity wounds.  Unna boots were placed yesterday on 1/7.  He reports scrotal edema has resolved.  He does report that he has some issues with obtaining his meds although he states he is on disability.  He does have active Medicaid.  His primary emergency contact is his daughter Paralee  Past Medical History:  Diagnosis Date  Arthritis    Atrial fibrillation (HCC)    CHF (congestive heart failure) (HCC)    Diabetes mellitus without complication (HCC)    Gout    Heart attack (HCC) 10/2018   Hyperlipidemia    Hypertension    Morbid obesity (HCC) 12/22/2016   Sleep apnea    had test twice unable to complete   Past Surgical History:  Procedure Laterality Date   ANKLE ARTHROSCOPY Right    COLONOSCOPY WITH PROPOFOL  N/A 09/18/2020   Procedure: COLONOSCOPY WITH PROPOFOL ;  Surgeon: Legrand Victory LITTIE DOUGLAS, MD;  Location: [facility];  Service: Gastroenterology;  Laterality: N/A;  09-18-2020    CORONARY STENT INTERVENTION N/A 11/24/2018   Procedure: CORONARY STENT INTERVENTION;  Surgeon: Darron Deatrice LABOR, MD;  Location: [facility];  Service: Cardiovascular;  Laterality: N/A;  prox and mid LAD   CORONARY STENT INTERVENTION N/A 11/26/2018   Procedure: CORONARY STENT INTERVENTION;  Surgeon: Darron Deatrice LABOR, MD;  Location: [facility];  Service: Cardiovascular;  Laterality: N/A;   CYST REMOVAL HAND     KNEE ARTHROSCOPY Left    LEFT HEART CATH AND CORONARY ANGIOGRAPHY N/A 11/24/2018   Procedure: LEFT HEART CATH AND CORONARY ANGIOGRAPHY;  Surgeon: Darron Deatrice LABOR, MD;  Location: [facility];  Service: Cardiovascular;  Laterality: N/A;   POLYPECTOMY  09/18/2020   Procedure: POLYPECTOMY;  Surgeon: Legrand Victory LITTIE DOUGLAS, MD;  Location: [facility];  Service: Gastroenterology;;   Social History:  reports that he has never smoked. He has never used smokeless tobacco. He reports current alcohol use of about 12.0 standard drinks of alcohol per week. He reports that he does not use drugs.  Allergies  Allergen Reactions   Metoprolol      Hypotension     Family History  Problem Relation Age of Onset   Diabetes Mother    Stroke Mother    Early death Father    Heart attack Father        died from heart attack at the age of 5   Alcohol abuse Father    Heart disease Father    Breast cancer Sister    Hypertension Brother    Heart attack Maternal Grandfather        died from heart attack at the age of 23   Early death Paternal Grandfather    Heart attack Paternal Grandfather    Colon cancer Neg Hx    Esophageal cancer Neg Hx    Rectal cancer Neg Hx    Stomach cancer Neg Hx     Prior to Admission medications   Medication Sig Start Date End Date Taking? Authorizing Provider  acetaminophen  (TYLENOL ) 325 MG tablet Take 2 tablets (650 mg total) by mouth every 6 (six) hours as needed for mild pain (pain score 1-3) or fever (or Fever >/= 101). 04/21/23  Yes Pearlean Manus,  MD  aspirin  EC 81 MG tablet Take 1 tablet (81 mg total) by mouth daily with breakfast. Swallow whole. 04/22/23  Yes Emokpae, Courage, MD  atorvastatin  (LIPITOR ) 80 MG tablet Take 1 tablet (80 mg total) by mouth daily. 04/21/23  Yes Pearlean Manus, MD  carvedilol  (COREG ) 6.25 MG tablet Take 1 tablet (6.25 mg total) by mouth 2 (two) times daily with a meal. 05/21/23  Yes Dettinger, Fonda LABOR, MD  collagenase  (SANTYL ) 250 UNIT/GM ointment Apply 1 Application topically daily. 05/21/23  Yes Dettinger, Fonda LABOR, MD  empagliflozin  (JARDIANCE ) 25 MG TABS tablet Take 1 tablet (25 mg total) by  mouth daily. 05/21/23  Yes Dettinger, Fonda LABOR, MD  famotidine  (PEPCID ) 20 MG tablet Take 1 tablet (20 mg total) by mouth 2 (two) times daily. 04/21/23  Yes Emokpae, Courage, MD  fenofibrate  (TRICOR ) 145 MG tablet Take 1 tablet (145 mg total) by mouth daily. 08/07/22  Yes Dettinger, Fonda LABOR, MD  fluticasone  (FLONASE ) 50 MCG/ACT nasal spray Place 1 spray into both nostrils 2 (two) times daily as needed for allergies or rhinitis. 05/21/23  Yes Dettinger, Fonda LABOR, MD  furosemide  (LASIX ) 40 MG tablet Take 1 tablet (40 mg total) by mouth daily. 05/21/23  Yes Dettinger, Fonda LABOR, MD  hydrOXYzine  (VISTARIL ) 25 MG capsule Take 1 capsule (25 mg total) by mouth every 8 (eight) hours as needed. 05/21/23  Yes Dettinger, Fonda LABOR, MD  losartan  (COZAAR ) 25 MG tablet Take 1 tablet (25 mg total) by mouth daily. Patient taking differently: Take 25 mg by mouth at bedtime. 08/07/22  Yes Dettinger, Fonda LABOR, MD  pantoprazole  (PROTONIX ) 40 MG tablet Take 40 mg by mouth daily. 04/21/23  Yes [provider]  silver  nitrate applicators 75-25 % applicator Apply topically 3 (three) times a week. 05/22/23  Yes Dettinger, Fonda LABOR, MD  silver  sulfADIAZINE  (SILVADENE ) 1 % cream Apply 1 Application topically daily. 05/21/23  Yes Dettinger, Fonda LABOR, MD  spironolactone  (ALDACTONE ) 25 MG tablet Take 0.5 tablets (12.5 mg total) by mouth daily. 04/21/23  Yes  Emokpae, Courage, MD  BD PEN NEEDLE NANO 2ND GEN 32G X 4 MM MISC Use daily with insulin  Dx E11.9, Z79.4 06/22/20   Dettinger, Fonda LABOR, MD  Blood Glucose Monitoring Suppl (ACCU-CHEK AVIVA PLUS) w/Device KIT 1 each by Does not apply route 4 (four) times daily. 06/17/21   Dettinger, Fonda LABOR, MD  Blood Glucose Monitoring Suppl (ACCU-CHEK AVIVA) device Test BS BID Dx E11.69 05/24/20   Dettinger, Fonda LABOR, MD  glucose blood (ACCU-CHEK GUIDE) test strip Check BS in the morning and at bedtime Dx E11.8 07/01/21   Dettinger, Fonda LABOR, MD    Physical Exam: Vitals:   05/27/23 0026 05/27/23 0552 05/27/23 0743 05/27/23 0930  BP: (!) 118/94 (!) 113/92  107/86  Pulse: 98 (!) 104  (!) 104  Resp: 18 16  19   Temp: 98.8 F (37.1 C) 97.6 F (36.4 C)  98.9 F (37.2 C)  TempSrc:  Oral  Oral  SpO2: 98% 99% 98% 100%  Weight:       Constitutional: NAD, calm, comfortable Respiratory: Bilateral lung sounds on posterior exam are somewhat coarse with a few expiratory crackles primarily in the bases.  Stable on room air without any increased work of breathing or tachypnea noted. Cardiovascular: Irregular rate underlying sinus rhythm mostly unifocal PVCs although occasional coupling noted, no murmurs / rubs / gallops.  Marked 3-4+ bilateral lower extremity edema that extends up to the thighs bilaterally. 2+ pedal pulses. No carotid bruits.  Abdomen: no tenderness, no masses palpated. No obvious hepatosplenomegaly. Bowel sounds positive.  Musculoskeletal: no clubbing / cyanosis. No joint deformity upper and lower extremities. Good ROM, no contractures. Normal muscle tone.  Skin: Lower extremities covered with Unna boots from knees through to the foot.  He has multiple papular lesions with various stages of healing and eschars primarily on the lower extremities although there are a few on his hands. Neurologic: CN 2-12 grossly intact. Sensation intact, DTR normal. Strength 5/5 x all 4 extremities.  Psychiatric: Alert and  oriented x 3. Normal mood.    Data Reviewed:  As per HPI  Assessment and Plan: Acute exacerbation of HFrEF Known ischemic cardiomyopathy with markedly reduced EF of 20% with associated global hypokinesis with echo May 05, 2023 Also associated diastolic dysfunction grade 3 Given the degree of edema will aggressively diurese utilizing Lasix  60 mg IV every 12 hours along with home Aldactone  Intake and output and weight daily Cardiology consultation recommendation Continue carvedilol  and Cozaar  as well as Jardiance  for GDMT Continue aspirin  and statin as well as above medications for medical management of CAD  Frequent PVCs Maintain potassium greater than 4 and make sure magnesium  is normal Increased risk for V-fib/ventricular tachycardia/torsades with markedly decreased LV systolic function  LV thrombus Redemonstrated on echo December 2024 Resume Eliquis  noting patient has been inconsistent in taking this medication recently  Pulmonary hypertension Moderate with measurement of 59 mmHg Afterload reduction as appropriate given severe reduction in left systolic function  Elevated troponin Patient denies chest pain and EKG unremarkable.  Suspect elevated troponin due to demand ischemia and heart failure  Mild AKI Likely due to hypoperfusion in regards to low output exacerbation of heart failure Anticipate with treatment of heart failure and diuresis creatinine will actually improve  Hypertension Medications as above Blood pressure at baseline actually has been running lower with decreased systolic function  Substance abuse Check urine drug screen History of regular cocaine use    Advance Care Planning:   Code Status: Full Code   VTE prophylaxis: Eliquis   Consults: Cardiology-consulted by EDP overnight  Family Communication: Patient only  Severity of Illness: The appropriate patient status for this patient is INPATIENT. Inpatient status is judged to be reasonable  and necessary in order to provide the required intensity of service to ensure the patient's safety. The patient's presenting symptoms, physical exam findings, and initial radiographic and laboratory data in the context of their chronic comorbidities is felt to place them at high risk for further clinical deterioration. Furthermore, it is not anticipated that the patient will be medically stable for discharge from the hospital within 2 midnights of admission.   * I certify that at the point of admission it is my clinical judgment that the patient will require inpatient hospital care spanning beyond 2 midnights from the point of admission due to high intensity of service, high risk for further deterioration and high frequency of surveillance required.*  Author: Isaiah Lever, NP 05/27/2023 11:22 AM  For on call review [email or web address].

## 2023-05-27 NOTE — ED Provider Notes (Signed)
 [facility] Provider Note   CSN: [id number] Arrival date & time: 05/26/23  1548     History Chief Complaint  Patient presents with   Leg Swelling    Evan [name] is a 58 y.o. male with history of A-fib, CHF, diabetes, hyperlipidemia, hypertension, polysubstance use presents emergency department today for evaluation of shortness of breath.  Patient was sent over from cardiology for admission for worsening shortness of breath.  Patient reports that he has had worsening shortness of the past few weeks.  He reports he send 20+ pound weight gain since 05-08-2023.  Denies any chest pain.  Reports his legs and scrotum are swollen.  He was recently increased from Lasix  20 mg to 40 mg.  He was placed on Eliquis  due to a clot in his heart however he did not take it because it made funny.  Denies any fevers or cough or cold symptoms.  He has chronic wounds to his lower extremities reports that his bandages were changed by Dr. Harden yesterday.  HPI     Home Medications Prior to Admission medications   Medication Sig Start Date End Date Taking? Authorizing Provider  acetaminophen  (TYLENOL ) 325 MG tablet Take 2 tablets (650 mg total) by mouth every 6 (six) hours as needed for mild pain (pain score 1-3) or fever (or Fever >/= 101). 04/21/23   Pearlean Manus, MD  aspirin  EC 81 MG tablet Take 1 tablet (81 mg total) by mouth daily with breakfast. Swallow whole. 04/22/23   Pearlean Manus, MD  atorvastatin  (LIPITOR ) 80 MG tablet Take 1 tablet (80 mg total) by mouth daily. 04/21/23   Pearlean Manus, MD  BD PEN NEEDLE NANO 2ND GEN 32G X 4 MM MISC Use daily with insulin  Dx E11.9, Z79.4 06/22/20   Dettinger, Fonda LABOR, MD  Blood Glucose Monitoring Suppl (ACCU-CHEK AVIVA PLUS) w/Device KIT 1 each by Does not apply route 4 (four) times daily. 06/17/21   Dettinger, Fonda LABOR, MD  Blood Glucose Monitoring Suppl (ACCU-CHEK AVIVA) device Test BS BID Dx E11.69 05/24/20    Dettinger, Fonda LABOR, MD  carvedilol  (COREG ) 6.25 MG tablet Take 1 tablet (6.25 mg total) by mouth 2 (two) times daily with a meal. 05/21/23   Dettinger, Fonda LABOR, MD  collagenase  (SANTYL ) 250 UNIT/GM ointment Apply 1 Application topically daily. 05/21/23   Dettinger, Fonda LABOR, MD  empagliflozin  (JARDIANCE ) 25 MG TABS tablet Take 1 tablet (25 mg total) by mouth daily. 05/21/23   Dettinger, Fonda LABOR, MD  famotidine  (PEPCID ) 20 MG tablet Take 1 tablet (20 mg total) by mouth 2 (two) times daily. 04/21/23   Pearlean Manus, MD  fenofibrate  (TRICOR ) 145 MG tablet Take 1 tablet (145 mg total) by mouth daily. 08/07/22   Dettinger, Fonda LABOR, MD  fluticasone  (FLONASE ) 50 MCG/ACT nasal spray Place 1 spray into both nostrils 2 (two) times daily as needed for allergies or rhinitis. 05/21/23   Dettinger, Fonda LABOR, MD  furosemide  (LASIX ) 40 MG tablet Take 1 tablet (40 mg total) by mouth daily. 05/21/23   Dettinger, Fonda LABOR, MD  glucose blood (ACCU-CHEK GUIDE) test strip Check BS in the morning and at bedtime Dx E11.8 07/01/21   Dettinger, Fonda LABOR, MD  hydrOXYzine  (VISTARIL ) 25 MG capsule Take 1 capsule (25 mg total) by mouth every 8 (eight) hours as needed. 05/21/23   Dettinger, Fonda LABOR, MD  losartan  (COZAAR ) 25 MG tablet Take 1 tablet (25 mg total) by mouth daily. Patient taking differently:  Take 25 mg by mouth at bedtime. 08/07/22   Dettinger, Fonda LABOR, MD  Omega-3 Fatty Acids (FISH OIL) 1000 MG CAPS Take 2,000 mg by mouth every other day.    [provider]  pantoprazole  (PROTONIX ) 40 MG tablet Take 40 mg by mouth daily. 04/21/23   [provider]  silver  nitrate applicators 75-25 % applicator Apply topically 3 (three) times a week. 05/22/23   Dettinger, Fonda LABOR, MD  silver  sulfADIAZINE  (SILVADENE ) 1 % cream Apply 1 Application topically daily. 05/21/23   Dettinger, Fonda LABOR, MD  spironolactone  (ALDACTONE ) 25 MG tablet Take 0.5 tablets (12.5 mg total) by mouth daily. 04/21/23   Pearlean Manus, MD       Allergies    Patient has no known allergies.    Review of Systems   Review of Systems  Constitutional:  Negative for chills and fever.  Respiratory:  Positive for shortness of breath. Negative for cough.   Cardiovascular:  Positive for leg swelling. Negative for chest pain and palpitations.  Gastrointestinal:  Negative for abdominal pain, diarrhea, nausea and vomiting.  Genitourinary:  Positive for scrotal swelling. Negative for dysuria, penile pain, penile swelling and testicular pain.    Physical Exam Updated Vital Signs BP (!) 118/94   Pulse 98   Temp 98.8 F (37.1 C)   Resp 18   Wt 125.6 kg   SpO2 98%   BMI 32.85 kg/m  Physical Exam Constitutional:      General: He is not in acute distress.    Appearance: He is not toxic-appearing.  Eyes:     General: No scleral icterus. Cardiovascular:     Rate and Rhythm: Normal rate.     Comments: Borderline tachycardic Pulmonary:     Effort: Pulmonary effort is normal. No respiratory distress.     Breath sounds: Normal breath sounds.  Abdominal:     Tenderness: There is no abdominal tenderness. There is no guarding or rebound.  Musculoskeletal:     Right lower leg: Edema present.     Left lower leg: Edema present.     Comments: Bilateral lower legs wrapped in bandages.  Dependent edema in the posterior aspect of the legs and into the scrotum.  Skin:    General: Skin is warm and dry.  Neurological:     Mental Status: He is alert.     ED Results / Procedures / Treatments   Labs (all labs ordered are listed, but only abnormal results are displayed) Labs Reviewed  BASIC METABOLIC PANEL - Abnormal; Notable for the following components:      Result Value   Sodium 133 (*)    Glucose, Bld 174 (*)    BUN 32 (*)    Creatinine, Ser 1.35 (*)    Calcium  8.2 (*)    All other components within normal limits  CBC - Abnormal; Notable for the following components:   RDW 15.6 (*)    All other components within normal limits   BRAIN NATRIURETIC PEPTIDE - Abnormal; Notable for the following components:   B Natriuretic Peptide 1,354.6 (*)    All other components within normal limits    EKG EKG Interpretation Date/Time:  Tuesday May 26 2023 17:41:03 EST Ventricular Rate:  97 PR Interval:  162 QRS Duration:  92 QT Interval:  354 QTC Calculation: 449 R Axis:   116  Text Interpretation: Sinus rhythm Right axis deviation Confirmed by Melvenia Motto [phone]) on 05/27/2023 6:47:45 AM  Radiology DG Chest 2 View Result Date: 05/26/2023  CLINICAL DATA:  Shortness of breath. EXAM: CHEST - 2 VIEW COMPARISON:  Chest x-ray 05/21/2023 FINDINGS: The heart is borderline enlarged but stable. The mediastinal and hilar contours are within normal limits. No acute pulmonary findings. No pulmonary edema or pleural effusions. No pulmonary infiltrates or pneumothorax. The bony thorax is intact. IMPRESSION: Borderline cardiomegaly but no acute pulmonary findings. Electronically Signed   By: MYRTIS Stammer M.D.   On: 05/26/2023 18:52    Procedures Procedures   Medications Ordered in ED Medications - No data to display  ED Course/ Medical Decision Making/ A&P Clinical Course as of 05/27/23 9394  Wed May 27, 2023  0559 Cardioology consulted at this time  [RR]    Clinical Course User Index [RR] Bernis Ernst, PA-C    Medical Decision Making Amount and/or Complexity of Data Reviewed Labs: ordered. Radiology: ordered.  Risk Prescription drug management. Decision regarding hospitalization.   58 y.o. male presents to the ER for evaluation of shortness of breath. Differential diagnosis includes but is not limited to CHF, pericardial effusion/tamponade, arrhythmias, ACS, COPD, asthma, bronchitis, pneumonia, pneumothorax, PE, anemia. Vital signs BP 113/92, HR 104 otherwise unremarkable. Physical exam as noted above.   On previous chart evaluation, the patient was sent to the ER for admission for weight gain and CHF exacerbation.   Cardiology sent over for medical admission and cardiology consultation.  Patient has had 20+ weight gain since 05-08-2023.  He has not taken his Eliquis  as he reports that it made him feel funny. Supposed to be on Eliquis  because of a clot in his left ventricle.  He denies any sudden worsening of his shortness of breath.  Reports has been gradually worsening past few weeks.  He reports he has been compliant with his Lasix  other than 2 weeks ago when he did not have it for 5 days due to pharmacy issues.   Care delayed due to ER wait times. I did not see this patient for 14 hours.  I independently reviewed and interpreted the patient's labs. BMP shows mild hyponatremia 133, glucose 124, creatinine 1.35 which is slightly if patient is normal at 1.1.  BUN of 32.  Calcium  8.2.  BNP sniffily elevated at 1354.6.  CBC without consistent since her anemia.  Patient is not complaining about any chest pain however I did add on a troponin.  Chest x-ray shows borderline cardiomegaly but no acute pulmonary findings.   EKG reviewed and interpreted by my attending and read as sinus rhythm right axis deviation.  Patient is speaking in full sentences. Does not appear in acute distress. I have ordered 40mg  IV lasix . Cardiology consulted at 0559, still have not heard back.  6:49 AM Care of Evan [name]  transferred to [facility] at the end of my shift as the patient will require reassessment once labs/imaging have resulted. Patient presentation, ED course, and plan of care discussed with review of all pertinent labs and imaging. Please see his/her note for further details regarding further ED course and disposition. Plan at time of handoff is follow up . This may be altered or completely changed at the discretion of the oncoming team pending results of further workup. Portions of this report may have been transcribed using voice recognition software. Every effort was made to ensure accuracy; however,  inadvertent computerized transcription errors may be present.   Final Clinical Impression(s) / ED Diagnoses Final diagnoses:  Acute on chronic congestive heart failure, unspecified heart failure type (HCC)  Peripheral edema  Rx / DC Orders ED Discharge Orders     None         Bernis Ernst, DEVONNA 05/27/23 0703    Melvenia Motto, MD 05/27/23 [phone]

## 2023-05-27 NOTE — ED Notes (Signed)
 ED TO INPATIENT HANDOFF REPORT  ED Nurse Name and Phone #: Dorthea PEAK [phone]  S Name/Age/Gender Evan [name] 58 y.o. male Room/Bed: 039C/039C  Code Status   Code Status: Full Code  Home/SNF/Other Home Patient oriented to: self, place, time, and situation Is this baseline? Yes   Triage Complete: Triage complete  Chief Complaint Acute HFrEF (heart failure with reduced ejection fraction) (HCC) [I50.21]  Triage Note PT send by PCP for evaluation of bilateral leg swelling, groin swelling and CHF. Pt states gained 24 lbs in 1 month. Pt also complains of SOB   Allergies Allergies  Allergen Reactions   Metoprolol      Hypotension     Level of Care/Admitting Diagnosis ED Disposition     ED Disposition  Admit   Condition  --   Comment  Hospital Area: [facility] [[id number]]  Level of Care: Telemetry Cardiac [[id number]]  May admit patient to [facility] or [facility] if equivalent level of care is available:: No  Covid Evaluation: Confirmed COVID Negative  Diagnosis: Acute HFrEF (heart failure with reduced ejection fraction) [facility]) [[id number]]  Admitting Physician: AKULA, VIJAYA [[id number]]  Attending Physician: AKULA, VIJAYA [[id number]]  Certification:: I certify this patient will need inpatient services for at least 2 midnights  Expected Medical Readiness: 05/30/2023          B Medical/Surgery History Past Medical History:  Diagnosis Date   Arthritis    Atrial fibrillation (HCC)    CHF (congestive heart failure) (HCC)    Diabetes mellitus without complication (HCC)    Gout    Heart attack (HCC) 10/2018   Hyperlipidemia    Hypertension    Morbid obesity (HCC) 12/22/2016   Sleep apnea    had test twice unable to complete   Past Surgical History:  Procedure Laterality Date   ANKLE ARTHROSCOPY Right    COLONOSCOPY WITH PROPOFOL  N/A 09/18/2020   Procedure: COLONOSCOPY WITH PROPOFOL ;  Surgeon: Legrand Victory LITTIE DOUGLAS, MD;  Location: [facility];  Service:  Gastroenterology;  Laterality: N/A;  09-18-2020   CORONARY STENT INTERVENTION N/A 11/24/2018   Procedure: CORONARY STENT INTERVENTION;  Surgeon: Darron Deatrice LABOR, MD;  Location: [facility];  Service: Cardiovascular;  Laterality: N/A;  prox and mid LAD   CORONARY STENT INTERVENTION N/A 11/26/2018   Procedure: CORONARY STENT INTERVENTION;  Surgeon: Darron Deatrice LABOR, MD;  Location: [facility] CV LAB;  Service: Cardiovascular;  Laterality: N/A;   CYST REMOVAL HAND     KNEE ARTHROSCOPY Left    LEFT HEART CATH AND CORONARY ANGIOGRAPHY N/A 11/24/2018   Procedure: LEFT HEART CATH AND CORONARY ANGIOGRAPHY;  Surgeon: Darron Deatrice LABOR, MD;  Location: [facility] CV [facility];  Service: Cardiovascular;  Laterality: N/A;   POLYPECTOMY  09/18/2020   Procedure: POLYPECTOMY;  Surgeon: Legrand Victory LITTIE DOUGLAS, MD;  Location: [facility];  Service: Gastroenterology;;     A IV Location/Drains/Wounds Patient Lines/Drains/Airways Status     Active Line/Drains/Airways     Name Placement date Placement time Site Days   Peripheral IV 05/27/23 Distal;Posterior;Right Forearm 05/27/23  0701  Forearm  less than 1   Wound / Incision (Open or Dehisced) 05/06/23 Diabetic ulcer Pretibial Right;Left several ulcerated wounds to bilateral legs 05/06/23  1450  Pretibial  21            Intake/Output Last 24 hours  Intake/Output Summary (Last 24 hours) at 05/27/2023 1239 Last data filed at 05/27/2023 1130 Gross per 24 hour  Intake 440 ml  Output  800 ml  Net -360 ml    Labs/Imaging Results for orders placed or performed during the hospital encounter of 05/26/23 (from the past 48 hours)  Basic metabolic panel     Status: Abnormal   Collection Time: 05/26/23  5:40 PM  Result Value Ref Range   Sodium 133 (L) 135 - 145 mmol/L   Potassium 4.5 3.5 - 5.1 mmol/L   Chloride 101 98 - 111 mmol/L   CO2 22 22 - 32 mmol/L   Glucose, Bld 174 (H) 70 - 99 mg/dL    Comment: Glucose reference range applies only to samples taken after  fasting for at least 8 hours.   BUN 32 (H) 6 - 20 mg/dL   Creatinine, Ser 8.64 (H) 0.61 - 1.24 mg/dL   Calcium  8.2 (L) 8.9 - 10.3 mg/dL   GFR, Estimated >39 >39 mL/min    Comment: (NOTE) Calculated using the CKD-EPI Creatinine Equation (2021)    Anion gap 10 5 - 15    Comment: Performed at [facility] Lab, [address] [place], [place] [address]  CBC     Status: Abnormal   Collection Time: 05/26/23  5:40 PM  Result Value Ref Range   WBC 9.8 4.0 - 10.5 K/uL   RBC 5.52 4.22 - 5.81 MIL/uL   Hemoglobin 14.6 13.0 - 17.0 g/dL   HCT 54.2 60.9 - 47.9 %   MCV 82.8 80.0 - 100.0 fL   MCH 26.4 26.0 - 34.0 pg   MCHC 31.9 30.0 - 36.0 g/dL   RDW 84.3 (H) 88.4 - 84.4 %   Platelets 280 150 - 400 K/uL   nRBC 0.0 0.0 - 0.2 %    Comment: Performed at [facility], [address], [place], [place] [address]  Brain natriuretic peptide     Status: Abnormal   Collection Time: 05/26/23  5:40 PM  Result Value Ref Range   B Natriuretic Peptide 1,354.6 (H) 0.0 - 100.0 pg/mL    Comment: Performed at [facility], [address] [place], [place] [address]  Troponin I (High Sensitivity)     Status: Abnormal   Collection Time: 05/27/23  6:44 AM  Result Value Ref Range   Troponin I (High Sensitivity) 39 (H) <18 ng/L    Comment: (NOTE) Elevated high sensitivity troponin I (hsTnI) values and significant  changes across serial measurements may suggest ACS but many other  chronic and acute conditions are known to elevate hsTnI results.  Refer to the Links section for chest pain algorithms and additional  guidance. Performed at [facility], [address], [place], [place] [address]   Troponin I (High Sensitivity)     Status: Abnormal   Collection Time: 05/27/23 10:03 AM  Result Value Ref Range   Troponin I (High Sensitivity) 42 (H) <18 ng/L    Comment: (NOTE) Elevated high sensitivity troponin I (hsTnI) values and significant  changes across serial measurements may suggest ACS  but many other  chronic and acute conditions are known to elevate hsTnI results.  Refer to the Links section for chest pain algorithms and additional  guidance. Performed at [facility], [address], [place], [place] [address]   CBC with Differential/Platelet     Status: Abnormal   Collection Time: 05/27/23 10:03 AM  Result Value Ref Range   WBC 12.5 (H) 4.0 - 10.5 K/uL   RBC 5.93 (H) 4.22 - 5.81 MIL/uL   Hemoglobin 15.8 13.0 - 17.0 g/dL  HCT 48.5 39.0 - 52.0 %   MCV 81.8 80.0 - 100.0 fL   MCH 26.6 26.0 - 34.0 pg   MCHC 32.6 30.0 - 36.0 g/dL   RDW 84.0 (H) 88.4 - 84.4 %   Platelets 302 150 - 400 K/uL   nRBC 0.0 0.0 - 0.2 %   Neutrophils Relative % 79 %   Neutro Abs 9.8 (H) 1.7 - 7.7 K/uL   Lymphocytes Relative 13 %   Lymphs Abs 1.6 0.7 - 4.0 K/uL   Monocytes Relative 7 %   Monocytes Absolute 0.9 0.1 - 1.0 K/uL   Eosinophils Relative 0 %   Eosinophils Absolute 0.1 0.0 - 0.5 K/uL   Basophils Relative 1 %   Basophils Absolute 0.1 0.0 - 0.1 K/uL   Immature Granulocytes 0 %   Abs Immature Granulocytes 0.05 0.00 - 0.07 K/uL    Comment: Performed at [facility] Lab, [address], [place], [place] [address]  HIV Antibody (routine testing w rflx)     Status: None   Collection Time: 05/27/23 10:03 AM  Result Value Ref Range   HIV Screen 4th Generation wRfx Non Reactive Non Reactive    Comment: Performed at [facility], [address], [place], [place] [address]   DG Chest 2 View Result Date: 05/26/2023 CLINICAL DATA:  Shortness of breath. EXAM: CHEST - 2 VIEW COMPARISON:  Chest x-ray 05/21/2023 FINDINGS: The heart is borderline enlarged but stable. The mediastinal and hilar contours are within normal limits. No acute pulmonary findings. No pulmonary edema or pleural effusions. No pulmonary infiltrates or pneumothorax. The bony thorax is intact. IMPRESSION: Borderline cardiomegaly but no acute pulmonary findings. Electronically Signed   By: MYRTIS Stammer M.D.    On: 05/26/2023 18:52    Pending Labs Unresulted Labs (From admission, onward)     Start     Ordered   05/28/23 0500  Basic metabolic panel  Tomorrow morning,   R        05/27/23 1122   05/27/23 1130  Comprehensive metabolic panel  Once,   STAT        05/27/23 1130   05/27/23 1130  Magnesium   Once,   STAT        05/27/23 1130   05/27/23 0922  Rapid urine drug screen (hospital performed)  ONCE - STAT,   STAT        05/27/23 0921            Vitals/Pain Today's Vitals   05/27/23 0930 05/27/23 1000 05/27/23 1015 05/27/23 1030  BP: 107/86 108/81 92/71 (!) 147/109  Pulse: (!) 104 97 93 82  Resp: 19 15 17  (!) 23  Temp: 98.9 F (37.2 C)     TempSrc: Oral     SpO2: 100% 100% 98% 91%  Weight:      PainSc:        Isolation Precautions No active isolations  Medications Medications  aspirin  EC tablet 81 mg (has no administration in time range)  atorvastatin  (LIPITOR ) tablet 80 mg (80 mg Oral Given 05/27/23 1019)  carvedilol  (COREG ) tablet 6.25 mg (has no administration in time range)  fenofibrate  tablet 160 mg (has no administration in time range)  losartan  (COZAAR ) tablet 25 mg (has no administration in time range)  spironolactone  (ALDACTONE ) tablet 12.5 mg (12.5 mg Oral Given 05/27/23 1020)  empagliflozin  (JARDIANCE ) tablet 25 mg (25 mg Oral Given 05/27/23 1020)  famotidine  (PEPCID ) tablet 20 mg (20 mg Oral Given 05/27/23 1020)  pantoprazole  (PROTONIX ) EC tablet 40 mg (40 mg Oral Given 05/27/23 1019)  furosemide  (LASIX ) injection 40 mg (40 mg Intravenous Given 05/27/23 1021)    Followed by  furosemide  (LASIX ) injection 60 mg (has no administration in time range)  apixaban  (ELIQUIS ) tablet 5 mg (5 mg Oral Given 05/27/23 1019)  acetaminophen  (TYLENOL ) tablet 650 mg (has no administration in time range)  sodium chloride  flush (NS) 0.9 % injection 3 mL (has no administration in time range)  ondansetron  (ZOFRAN ) tablet 4 mg (has no administration in time range)    Or  ondansetron  (ZOFRAN )  injection 4 mg (has no administration in time range)  furosemide  (LASIX ) injection 40 mg (40 mg Intravenous Given 05/27/23 0706)  hydrOXYzine  (ATARAX ) tablet 25 mg (25 mg Oral Given 05/27/23 0757)    Mobility walks     Focused Assessments Cardiac Assessment Handoff:  Cardiac Rhythm: Sinus tachycardia No results found for: CKTOTAL, CKMB, CKMBINDEX, TROPONINI Lab Results  Component Value Date   DDIMER 1.73 (H) 05/05/2023   Does the Patient currently have chest pain? No    R Recommendations: See Admitting Provider Note  Report given to:   Additional Notes:

## 2023-05-27 NOTE — Progress Notes (Signed)
 Heart Failure Stewardship Pharmacist Progress Note   PCP: Dettinger, Fonda LABOR, MD PCP-Cardiologist: Dorn Lesches, MD    HPI:  58 yo M with PMH of HTN, HLD, CAD, CHF, afib, and polysubstance use.   Admitted 11/2018 with late presenting anterior STEMI. Found to have severe 2 vessel disease. S/p 2 DES in ostial and mid LAD and underwent staged mid RCA PCI 2 days later. ECHO with LVEF 25-30%. Repeat ECHO 02/2019 with EF slightly improved to 35-40%.  In the ED on 03/23/23 with shortness of breath. Recently ran out of lasix . BNP 880. Lasix  was increased to 40 mg daily x 5 days then reduce back to 20 mg daily.  Seen back in office 04/13/23 and still was volume overloaded. Lasix  increased to 40 mg daily.  Hospitalized 11/30-12/3 with acute cholecystitis. Readmitted 12/16 with wound infection. BNP 1000 and ECHO showed LVEF 20%, G3DD, RV moderately reduced, moderate MR. Left AMA.   He was then seen in the ED on 12/25, 12/26, 12/28, and 1/2 for shortness of breath and LE edema.   Was seen by outpatient cardiology on 1/7 and was complaining of shortness of breath, weight gain, and LE edema. He was taking furosemide  40 mg daily without improvement. Weight was up about 20 lbs. He was referred to the ED for further evaluation. BNP 1354.6.  States his breathing is improved. Initially laying flat and asleep upon entering the room. Still feels like he has LE and abdominal edema. His legs are wrapped and states that his wounds are painful. Reports that he has a difficult time getting [facility].   Current HF Medications: Diuretic: furosemide  40 mg IV x 1 then 60 mg IV BID Beta Blocker: carvedilol  6.25 mg BID ACE/ARB/ARNI: losartan  25 mg qhs MRA: spironolactone  12.5 mg daily SGLT2i: Jardiance  25 mg daily  Prior to admission HF Medications: Diuretic: furosemide  40 mg daily Beta blocker: carvedilol  6.25 mg BID ACE/ARB/ARNI: losartan  25 mg qhs MRA: spironolactone  12.5 mg daily SGLT2i:  Jardiance  25 mg daily  Pertinent Lab Values: Labs pending 1/8 As of 1/7: Serum creatinine 1.35, BUN 32, Potassium 4.5, Sodium 133, BNP 1354.6, Magnesium  pending, A1c 7.3   Vital Signs: Weight: 277 lbs (admission weight: 277 lbs) Blood pressure: 110-120/90s  Heart rate: 100s  I/O: incomplete  Medication Assistance / Insurance Benefits Check: Does the patient have prescription insurance?  Yes Type of insurance plan: [place] Medicaid  Outpatient Pharmacy:  [facility] Is the patient willing to use [facility] TOC pharmacy at discharge? Yes Is the patient willing to transition their outpatient pharmacy to utilize a [facility] outpatient pharmacy?   Yes - cannot get medications regularly [facility]. Interested in switching to [facility] mail order.     Assessment: 1. Acute on chronic systolic CHF (LVEF 20%), due to ICM. NYHA class III symptoms. - Agree with additional dose of furosemide  40 mg IV x 1 then scheduling 60 mg IV BID moving forward. Strict I/Os and daily weights. Keep K>4 and Mg>2. Labs pending for today. - Continue carvedilol  6.25 mg BID - On losartan  25 mg at bedtime - consider optimizing to Entresto 24/26 mg BID before discharge. Follow up labs today to determine timing.  - Continue spironolactone  12.5 mg daily. May be able to increase to target dose 25 mg daily pending potassium - Continue Jardiance  25 mg daily   Plan: 1) Medication changes recommended at this time: - Continue IV diuresis   2) Patient assistance: - None pending, has  [place] Medicaid  3)  Education  - Initial education complete - Full education to be completed prior to discharge  Duwaine Plant, PharmD, BCPS Heart Failure Stewardship Pharmacist Phone [phone]

## 2023-05-27 NOTE — Consult Note (Signed)
 WOC aware of patient's pending admission. Notified Dr. Lajoyce Corners who saw patient yesterday and placed in compression wraps he will order care for weekly changes.   Whitlee Sluder [facility], CNS, [employer] [phone]

## 2023-05-28 ENCOUNTER — Encounter (HOSPITAL_COMMUNITY): Payer: Self-pay | Admitting: Internal Medicine

## 2023-05-28 ENCOUNTER — Other Ambulatory Visit: Payer: Self-pay

## 2023-05-28 DIAGNOSIS — E871 Hypo-osmolality and hyponatremia: Secondary | ICD-10-CM

## 2023-05-28 DIAGNOSIS — I5082 Biventricular heart failure: Secondary | ICD-10-CM | POA: Diagnosis not present

## 2023-05-28 DIAGNOSIS — N179 Acute kidney failure, unspecified: Secondary | ICD-10-CM | POA: Diagnosis not present

## 2023-05-28 DIAGNOSIS — E1169 Type 2 diabetes mellitus with other specified complication: Secondary | ICD-10-CM | POA: Diagnosis not present

## 2023-05-28 DIAGNOSIS — I5023 Acute on chronic systolic (congestive) heart failure: Secondary | ICD-10-CM | POA: Diagnosis not present

## 2023-05-28 DIAGNOSIS — I2510 Atherosclerotic heart disease of native coronary artery without angina pectoris: Secondary | ICD-10-CM

## 2023-05-28 DIAGNOSIS — I890 Lymphedema, not elsewhere classified: Secondary | ICD-10-CM | POA: Diagnosis not present

## 2023-05-28 DIAGNOSIS — I87333 Chronic venous hypertension (idiopathic) with ulcer and inflammation of bilateral lower extremity: Secondary | ICD-10-CM

## 2023-05-28 DIAGNOSIS — I509 Heart failure, unspecified: Secondary | ICD-10-CM

## 2023-05-28 DIAGNOSIS — I255 Ischemic cardiomyopathy: Secondary | ICD-10-CM | POA: Diagnosis not present

## 2023-05-28 DIAGNOSIS — I272 Pulmonary hypertension, unspecified: Secondary | ICD-10-CM | POA: Diagnosis not present

## 2023-05-28 DIAGNOSIS — F1490 Cocaine use, unspecified, uncomplicated: Secondary | ICD-10-CM

## 2023-05-28 DIAGNOSIS — Z955 Presence of coronary angioplasty implant and graft: Secondary | ICD-10-CM | POA: Diagnosis not present

## 2023-05-28 DIAGNOSIS — R600 Localized edema: Secondary | ICD-10-CM | POA: Diagnosis not present

## 2023-05-28 DIAGNOSIS — I5021 Acute systolic (congestive) heart failure: Secondary | ICD-10-CM | POA: Diagnosis not present

## 2023-05-28 DIAGNOSIS — G4733 Obstructive sleep apnea (adult) (pediatric): Secondary | ICD-10-CM

## 2023-05-28 DIAGNOSIS — I4729 Other ventricular tachycardia: Secondary | ICD-10-CM | POA: Diagnosis not present

## 2023-05-28 DIAGNOSIS — I10 Essential (primary) hypertension: Secondary | ICD-10-CM | POA: Diagnosis not present

## 2023-05-28 DIAGNOSIS — I513 Intracardiac thrombosis, not elsewhere classified: Secondary | ICD-10-CM | POA: Diagnosis not present

## 2023-05-28 DIAGNOSIS — I776 Arteritis, unspecified: Secondary | ICD-10-CM | POA: Diagnosis not present

## 2023-05-28 LAB — GLUCOSE, CAPILLARY
Glucose-Capillary: 159 mg/dL — ABNORMAL HIGH (ref 70–99)
Glucose-Capillary: 164 mg/dL — ABNORMAL HIGH (ref 70–99)

## 2023-05-28 LAB — BASIC METABOLIC PANEL
Anion gap: 10 (ref 5–15)
BUN: 29 mg/dL — ABNORMAL HIGH (ref 6–20)
CO2: 24 mmol/L (ref 22–32)
Calcium: 8.1 mg/dL — ABNORMAL LOW (ref 8.9–10.3)
Chloride: 97 mmol/L — ABNORMAL LOW (ref 98–111)
Creatinine, Ser: 1.33 mg/dL — ABNORMAL HIGH (ref 0.61–1.24)
GFR, Estimated: >60 mL/min (ref >60)
Glucose, Bld: 168 mg/dL — ABNORMAL HIGH (ref 70–99)
Potassium: 3.5 mmol/L (ref 3.5–5.1)
Sodium: 131 mmol/L — ABNORMAL LOW (ref 135–145)

## 2023-05-28 LAB — COMPREHENSIVE METABOLIC PANEL
ALT: 82 U/L — ABNORMAL HIGH (ref 0–44)
AST: 54 U/L — ABNORMAL HIGH (ref 15–41)
Albumin: 3.1 g/dL — ABNORMAL LOW (ref 3.5–5.0)
Alkaline Phosphatase: 99 U/L (ref 38–126)
Anion gap: 9 (ref 5–15)
BUN: 25 mg/dL — ABNORMAL HIGH (ref 6–20)
CO2: 23 mmol/L (ref 22–32)
Calcium: 8.1 mg/dL — ABNORMAL LOW (ref 8.9–10.3)
Chloride: 100 mmol/L (ref 98–111)
Creatinine, Ser: 1.28 mg/dL — ABNORMAL HIGH (ref 0.61–1.24)
GFR, Estimated: >60 mL/min (ref >60)
Glucose, Bld: 153 mg/dL — ABNORMAL HIGH (ref 70–99)
Potassium: 4.2 mmol/L (ref 3.5–5.1)
Sodium: 132 mmol/L — ABNORMAL LOW (ref 135–145)
Total Bilirubin: 0.9 mg/dL (ref 0.0–1.2)
Total Protein: 6.6 g/dL (ref 6.5–8.1)

## 2023-05-28 LAB — MAGNESIUM: Magnesium: 2.2 mg/dL (ref 1.7–2.4)

## 2023-05-28 LAB — LACTIC ACID, PLASMA: Lactic Acid, Venous: 1.5 mmol/L (ref 0.5–1.9)

## 2023-05-28 MED ORDER — INSULIN ASPART 100 UNIT/ML IJ SOLN
0.0000 [IU] | Freq: Three times a day (TID) | INTRAMUSCULAR | Status: DC
  Administered 2023-05-28 – 2023-05-30 (×5): 2 [IU] via SUBCUTANEOUS
  Administered 2023-05-30 – 2023-05-31 (×3): 1 [IU] via SUBCUTANEOUS
  Administered 2023-05-31 (×2): 2 [IU] via SUBCUTANEOUS
  Administered 2023-06-01: 1 [IU] via SUBCUTANEOUS
  Administered 2023-06-02: 2 [IU] via SUBCUTANEOUS
  Administered 2023-06-02 – 2023-06-03 (×4): 1 [IU] via SUBCUTANEOUS

## 2023-05-28 MED ORDER — METOLAZONE 5 MG PO TABS
5.0000 mg | ORAL_TABLET | Freq: Once | ORAL | Status: AC
  Administered 2023-05-28: 5 mg via ORAL
  Filled 2023-05-28: qty 1

## 2023-05-28 MED ORDER — MIDODRINE HCL 5 MG PO TABS
5.0000 mg | ORAL_TABLET | Freq: Three times a day (TID) | ORAL | Status: DC
  Administered 2023-05-28 – 2023-06-02 (×16): 5 mg via ORAL
  Filled 2023-05-28 (×17): qty 1

## 2023-05-28 MED ORDER — COLLAGENASE 250 UNIT/GM EX OINT
1.0000 | TOPICAL_OINTMENT | Freq: Every day | CUTANEOUS | Status: DC
  Administered 2023-05-28 – 2023-06-02 (×6): 1 via TOPICAL
  Filled 2023-05-28: qty 30

## 2023-05-28 MED ORDER — MILRINONE LACTATE IN DEXTROSE 20-5 MG/100ML-% IV SOLN
0.2500 ug/kg/min | INTRAVENOUS | Status: DC
  Administered 2023-05-29 – 2023-05-31 (×8): 0.25 ug/kg/min via INTRAVENOUS
  Filled 2023-05-28 (×7): qty 100

## 2023-05-28 MED ORDER — DIGOXIN 125 MCG PO TABS
0.1250 mg | ORAL_TABLET | Freq: Every day | ORAL | Status: DC
  Administered 2023-05-28 – 2023-06-02 (×6): 0.125 mg via ORAL
  Filled 2023-05-28 (×6): qty 1

## 2023-05-28 MED ORDER — FLUTICASONE PROPIONATE 50 MCG/ACT NA SUSP
1.0000 | Freq: Two times a day (BID) | NASAL | Status: DC | PRN
  Administered 2023-05-28 – 2023-05-30 (×2): 1 via NASAL

## 2023-05-28 MED ORDER — HYDROXYZINE HCL 25 MG PO TABS: 25.0000 mg | ORAL_TABLET | Freq: Three times a day (TID) | ORAL | Status: DC | PRN

## 2023-05-28 MED ORDER — FUROSEMIDE 10 MG/ML IJ SOLN: 40.0000 mg | Freq: Two times a day (BID) | INTRAMUSCULAR | Status: DC

## 2023-05-28 MED ORDER — POTASSIUM CHLORIDE CRYS ER 20 MEQ PO TBCR
40.0000 meq | EXTENDED_RELEASE_TABLET | Freq: Once | ORAL | Status: AC
  Administered 2023-05-28: 40 meq via ORAL
  Filled 2023-05-28: qty 2

## 2023-05-28 MED ORDER — FUROSEMIDE 10 MG/ML IJ SOLN
80.0000 mg | Freq: Two times a day (BID) | INTRAMUSCULAR | Status: DC
  Administered 2023-05-29 – 2023-06-01 (×7): 80 mg via INTRAVENOUS
  Filled 2023-05-28 (×8): qty 8

## 2023-05-28 MED ORDER — SILVER SULFADIAZINE 1 % EX CREA
1.0000 | TOPICAL_CREAM | Freq: Every day | CUTANEOUS | Status: DC
  Administered 2023-05-28 – 2023-06-02 (×6): 1 via TOPICAL
  Filled 2023-05-28: qty 85

## 2023-05-28 NOTE — Plan of Care (Signed)

## 2023-05-28 NOTE — Progress Notes (Signed)
 Heart Failure Nurse Navigator Progress Note  PCP: Dettinger, Fonda LABOR, MD PCP-Cardiologist: Court Admission Diagnosis: Acute on Chronic congestive heart failure, Peripheral edema.  Admitted from: PCP office to ED  Presentation:   Evan [name] presented with bilateral leg swelling, groin swelling, shortness of breath and reported weight gain of 24 pounds in a month. Chronic wounds to lower extremities, ( seen by Dr. Harden for treatment) BNP 1354,CXR with borderline cardiomegaly, no acute findings. seen in the ED on 12/25, 12/26, 12/28, and 1/2 for shortness of breath and LE edema. Last ED visit he signed out AMA due to long wait times for a bed.   Patient was educated on the sign and symptoms of heart failure, daily weights, when to call his doctor or go to the ED, Diet/ fluid restrictions, reported to drinking lots of soda ( pepsi) and eating salty foods. Education on taking all his medication as prescribed , he reported to not always getting his medications from the pharmacy and missing dosages, and attending all medical appointments. Patient verbalized his understanding of education. A HF TOC appointment was scheduled for 06/08/2023 @ 9 am.     ECHO/ LVEF: 20%  Clinical Course:  Past Medical History:  Diagnosis Date   Arthritis    Atrial fibrillation (HCC)    CHF (congestive heart failure) (HCC)    Diabetes mellitus without complication (HCC)    Gout    Heart attack (HCC) 10/2018   Hyperlipidemia    Hypertension    Morbid obesity (HCC) 12/22/2016   Sleep apnea    had test twice unable to complete     Social History   Socioeconomic History   Marital status: Single    Spouse name: Not on file   Number of children: Not on file   Years of education: Not on file   Highest education level: Not on file  Occupational History   Not on file  Tobacco Use   Smoking status: Never   Smokeless tobacco: Never  Vaping Use   Vaping status: Never Used  Substance and Sexual Activity    Alcohol use: Yes    Alcohol/week: 12.0 standard drinks of alcohol    Types: 12 Cans of beer per week    Comment: 5 beers a week   Drug use: No   Sexual activity: Yes    Comment: male 8 months partner  Other Topics Concern   Not on file  Social History Narrative   Not on file   Social Drivers of Health   Financial Resource Strain: Medium Risk (10/07/2022)   [facility]   Overall Financial Resource Strain (CARDIA)    Difficulty of Paying Living Expenses: Somewhat hard  Food Insecurity: No Food Insecurity (05/28/2023)   Hunger Vital Sign    Worried About Running Out of Food in the Last Year: Never true    Ran Out of Food in the Last Year: Never true  Transportation Needs: No Transportation Needs (05/28/2023)   PRAPARE - Administrator, Civil Service (Medical): No    Lack of Transportation (Non-Medical): No  Physical Activity: Not on file  Stress: Not on file  Social Connections: Not on file   Education Assessment and Provision:  Detailed education and instructions provided on heart failure disease management including the following:  Signs and symptoms of Heart Failure When to call the physician Importance of daily weights Low sodium diet Fluid restriction Medication management Anticipated future follow-up appointments  Patient education given on each of the above topics.  Patient acknowledges understanding via teach back method and acceptance of all instructions.  Education Materials:  Living Better With Heart Failure Booklet, HF zone tool, & Daily Weight Tracker Tool.  Patient has scale at home: yes Patient has pill box at home: NA    High Risk Criteria for Readmission and/or Poor Patient Outcomes: Heart failure hospital admissions (last 6 months): 2  No Show rate: 14% Difficult social situation: No, lives with his mom Demonstrates medication adherence:No  Primary Language: English Literacy level: Reading, writing, and  comprehension  Barriers of Care:   Medication compliance  Diet/ fluid restrictions ( Soda/ salty foods) Daily weights  Considerations/Referrals:   Referral made to Heart Failure Pharmacist Stewardship: yes Referral made to Heart Failure CSW/NCM TOC: No Referral made to Heart & Vascular TOC clinic: Yes, 06/08/2023 @ 9 am  Items for Follow-up on DC/TOC: Medication compliance Diet/ fluid restrictions ( soda/ salty foods)  Daily weights Continued HF education   Stephane Haddock, BSN, RN Heart Failure Print Production Planner Chat Only

## 2023-05-28 NOTE — Consult Note (Signed)
 ORTHOPAEDIC CONSULTATION  REQUESTING PHYSICIAN: Cherlyn Labella, MD  Chief Complaint: Venous insufficiency ulcerations both legs.  HPI: Evan [name] is a 58 y.o. male who presents with congestive heart failure with increased swelling of both lower extremities with multiple venous insufficiency ulcers on both legs and both thighs.  Past Medical History:  Diagnosis Date   Arthritis    Atrial fibrillation (HCC)    CHF (congestive heart failure) (HCC)    Diabetes mellitus without complication (HCC)    Gout    Heart attack (HCC) 10/2018   Hyperlipidemia    Hypertension    Morbid obesity (HCC) 12/22/2016   Sleep apnea    had test twice unable to complete   Past Surgical History:  Procedure Laterality Date   ANKLE ARTHROSCOPY Right    COLONOSCOPY WITH PROPOFOL  N/A 09/18/2020   Procedure: COLONOSCOPY WITH PROPOFOL ;  Surgeon: Legrand Victory LITTIE DOUGLAS, MD;  Location: [facility];  Service: Gastroenterology;  Laterality: N/A;  09-18-2020   CORONARY STENT INTERVENTION N/A 11/24/2018   Procedure: CORONARY STENT INTERVENTION;  Surgeon: Darron Deatrice LABOR, MD;  Location: [facility];  Service: Cardiovascular;  Laterality: N/A;  prox and mid LAD   CORONARY STENT INTERVENTION N/A 11/26/2018   Procedure: CORONARY STENT INTERVENTION;  Surgeon: Darron Deatrice LABOR, MD;  Location: [facility];  Service: Cardiovascular;  Laterality: N/A;   CYST REMOVAL HAND     KNEE ARTHROSCOPY Left    LEFT HEART CATH AND CORONARY ANGIOGRAPHY N/A 11/24/2018   Procedure: LEFT HEART CATH AND CORONARY ANGIOGRAPHY;  Surgeon: Darron Deatrice LABOR, MD;  Location: [facility];  Service: Cardiovascular;  Laterality: N/A;   POLYPECTOMY  09/18/2020   Procedure: POLYPECTOMY;  Surgeon: Legrand Victory LITTIE DOUGLAS, MD;  Location: [facility];  Service: Gastroenterology;;   Social History   Socioeconomic History   Marital status: Single    Spouse name: Not on file   Number of children: Not on file   Years of education: Not on file    Highest education level: Not on file  Occupational History   Not on file  Tobacco Use   Smoking status: Never   Smokeless tobacco: Never  Vaping Use   Vaping status: Never Used  Substance and Sexual Activity   Alcohol use: Yes    Alcohol/week: 12.0 standard drinks of alcohol    Types: 12 Cans of beer per week    Comment: 5 beers a week   Drug use: No   Sexual activity: Yes    Comment: male 8 months partner  Other Topics Concern   Not on file  Social History Narrative   Not on file   Social Drivers of Health   Financial Resource Strain: Medium Risk (10/07/2022)   Received from [facility], [facility]   Overall Financial Resource Strain (CARDIA)    Difficulty of Paying Living Expenses: Somewhat hard  Food Insecurity: No Food Insecurity (05/28/2023)   Hunger Vital Sign    Worried About Running Out of Food in the Last Year: Never true    Ran Out of Food in the Last Year: Never true  Transportation Needs: No Transportation Needs (05/28/2023)   PRAPARE - Administrator, Civil Service (Medical): No    Lack of Transportation (Non-Medical): No  Physical Activity: Not on file  Stress: Not on file  Social Connections: Not on file   Family History  Problem Relation Age of Onset   Diabetes Mother  Stroke Mother    Early death Father    Heart attack Father        died from heart attack at the age of 54   Alcohol abuse Father    Heart disease Father    Breast cancer Sister    Hypertension Brother    Heart attack Maternal Grandfather        died from heart attack at the age of 81   Early death Paternal Grandfather    Heart attack Paternal Grandfather    Colon cancer Neg Hx    Esophageal cancer Neg Hx    Rectal cancer Neg Hx    Stomach cancer Neg Hx    - negative except otherwise stated in the family history section Allergies  Allergen Reactions   Metoprolol      Hypotension    Prior to Admission medications   Medication Sig Start Date End Date  Taking? Authorizing Provider  acetaminophen  (TYLENOL ) 325 MG tablet Take 2 tablets (650 mg total) by mouth every 6 (six) hours as needed for mild pain (pain score 1-3) or fever (or Fever >/= 101). 04/21/23  Yes Emokpae, Courage, MD  aspirin  EC 81 MG tablet Take 1 tablet (81 mg total) by mouth daily with breakfast. Swallow whole. 04/22/23  Yes Emokpae, Courage, MD  atorvastatin  (LIPITOR ) 80 MG tablet Take 1 tablet (80 mg total) by mouth daily. 04/21/23  Yes Pearlean Manus, MD  carvedilol  (COREG ) 6.25 MG tablet Take 1 tablet (6.25 mg total) by mouth 2 (two) times daily with a meal. 05/21/23  Yes Dettinger, Fonda LABOR, MD  collagenase  (SANTYL ) 250 UNIT/GM ointment Apply 1 Application topically daily. 05/21/23  Yes Dettinger, Fonda LABOR, MD  empagliflozin  (JARDIANCE ) 25 MG TABS tablet Take 1 tablet (25 mg total) by mouth daily. 05/21/23  Yes Dettinger, Fonda LABOR, MD  famotidine  (PEPCID ) 20 MG tablet Take 1 tablet (20 mg total) by mouth 2 (two) times daily. 04/21/23  Yes Emokpae, Courage, MD  fenofibrate  (TRICOR ) 145 MG tablet Take 1 tablet (145 mg total) by mouth daily. 08/07/22  Yes Dettinger, Fonda LABOR, MD  fluticasone  (FLONASE ) 50 MCG/ACT nasal spray Place 1 spray into both nostrils 2 (two) times daily as needed for allergies or rhinitis. 05/21/23  Yes Dettinger, Fonda LABOR, MD  furosemide  (LASIX ) 40 MG tablet Take 1 tablet (40 mg total) by mouth daily. 05/21/23  Yes Dettinger, Fonda LABOR, MD  hydrOXYzine  (VISTARIL ) 25 MG capsule Take 1 capsule (25 mg total) by mouth every 8 (eight) hours as needed. 05/21/23  Yes Dettinger, Fonda LABOR, MD  losartan  (COZAAR ) 25 MG tablet Take 1 tablet (25 mg total) by mouth daily. Patient taking differently: Take 25 mg by mouth at bedtime. 08/07/22  Yes Dettinger, Fonda LABOR, MD  pantoprazole  (PROTONIX ) 40 MG tablet Take 40 mg by mouth daily. 04/21/23  Yes [provider]  silver  nitrate applicators 75-25 % applicator Apply topically 3 (three) times a week. 05/22/23  Yes Dettinger, Fonda LABOR,  MD  silver  sulfADIAZINE  (SILVADENE ) 1 % cream Apply 1 Application topically daily. 05/21/23  Yes Dettinger, Fonda LABOR, MD  spironolactone  (ALDACTONE ) 25 MG tablet Take 0.5 tablets (12.5 mg total) by mouth daily. 04/21/23  Yes Emokpae, Courage, MD  BD PEN NEEDLE NANO 2ND GEN 32G X 4 MM MISC Use daily with insulin  Dx E11.9, Z79.4 06/22/20   Dettinger, Fonda LABOR, MD  Blood Glucose Monitoring Suppl (ACCU-CHEK AVIVA PLUS) w/Device KIT 1 each by Does not apply route 4 (four) times daily. 06/17/21  Dettinger, Fonda LABOR, MD  Blood Glucose Monitoring Suppl (ACCU-CHEK AVIVA) device Test BS BID Dx E11.69 05/24/20   Dettinger, Fonda LABOR, MD  glucose blood (ACCU-CHEK GUIDE) test strip Check BS in the morning and at bedtime Dx E11.8 07/01/21   Dettinger, Fonda LABOR, MD   DG Chest 2 View Result Date: 05/26/2023 CLINICAL DATA:  Shortness of breath. EXAM: CHEST - 2 VIEW COMPARISON:  Chest x-ray 05/21/2023 FINDINGS: The heart is borderline enlarged but stable. The mediastinal and hilar contours are within normal limits. No acute pulmonary findings. No pulmonary edema or pleural effusions. No pulmonary infiltrates or pneumothorax. The bony thorax is intact. IMPRESSION: Borderline cardiomegaly but no acute pulmonary findings. Electronically Signed   By: MYRTIS Stammer M.D.   On: 05/26/2023 18:52   - pertinent xrays, CT, MRI studies were reviewed and independently interpreted  Positive ROS: All other systems have been reviewed and were otherwise negative with the exception of those mentioned in the HPI and as above.  Physical Exam: General: Alert, no acute distress Psychiatric: Patient is competent for consent with normal mood and affect Lymphatic: No axillary or cervical lymphadenopathy Cardiovascular: No pedal edema Respiratory: No cyanosis, no use of accessory musculature GI: No organomegaly, abdomen is soft and non-tender    Images:  @ENCIMAGES @  Labs:  Lab Results  Component Value Date   HGBA1C 7.3 (H) 04/19/2023    HGBA1C 7.4 (H) 02/11/2023   HGBA1C 7.6 (H) 11/10/2022   ESRSEDRATE 3 05/05/2023   ESRSEDRATE 1 05/05/2023   CRP 2.0 (H) 05/05/2023   CRP 2.5 (H) 05/05/2023   LABURIC 7.1 10/12/2019   LABURIC 6.1 11/24/2018   LABURIC 5.9 02/04/2017   REPTSTATUS 05/10/2023 FINAL 05/05/2023   CULT  05/05/2023    NO GROWTH 5 DAYS Performed at [facility], [address], [place], [place] [address]     Lab Results  Component Value Date   ALBUMIN 3.0 (L) 05/27/2023   ALBUMIN 3.8 05/21/2023   ALBUMIN 3.0 (L) 05/06/2023   PREALBUMIN 12 (L) 05/05/2023   LABURIC 7.1 10/12/2019   LABURIC 6.1 11/24/2018   LABURIC 5.9 02/04/2017  Yearly awe      Latest Ref Rng & Units 05/27/2023   10:03 AM 05/26/2023    5:40 PM 05/21/2023   12:39 PM  CBC EXTENDED  WBC 4.0 - 10.5 K/uL 12.5  9.8  10.7   RBC 4.22 - 5.81 MIL/uL 5.93  5.52  5.57   Hemoglobin 13.0 - 17.0 g/dL 84.1  85.3  84.8   HCT 39.0 - 52.0 % 48.5  45.7  48.4   Platelets 150 - 400 K/uL 302  280  258   NEUT# 1.7 - 7.7 K/uL 9.8   7.9   Lymph# 0.7 - 4.0 K/uL 1.6   1.7     Neurologic: Patient does not have protective sensation bilateral lower extremities.   MUSCULOSKELETAL:   Skin: Examination patient has multiple small ulcers on both legs secondary to venous and lymphatic insufficiency swelling of the thigh and leg secondary to congestive heart failure.  There is no cellulitis no depth to the wounds.  White cell count has increased to 12.5.  Albumin 3.0 with a free albumin of 12. Hemoglobin A1c 7.3. Assessment: Assessment: Venous and lymphatic insufficiency ulcerations both lower extremities secondary to congestive heart failure.  Plan: Patient will proceed with twice a week compression wrap to both lower extremities.  Anticipate I can advance him to medicated compression socks at follow-up in the office.  Thank you for the consult and the opportunity to see Mr. Ramell Wacha, MD [facility] [phone] 8:19  AM

## 2023-05-28 NOTE — Consult Note (Signed)
 ADVANCED HEART FAILURE CONSULT NOTE  Referring Physician: No ref. provider found  Primary Care: Dettinger, Fonda LABOR, MD Primary Cardiologist: Dorn Lesches, MD  CC: Evaluation for cardiogenic shock  HPI: Evan [name] is a 58 y.o. 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  According to [name], over the past several months he has had multiple hospitalizations.  He reports to me that he has been admitted to multiple hospitals however is discharged shortly after and very quickly becomes volume overloaded and short of breath.  He was last hospitalized here in December 2024 for lower extremity wounds and acute on chronic systolic heart failure.  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  Echocardiogram at that time with LVEF of 20% and LV apical thrombus with moderate mitral regurgitation.  At that time he was also using cocaine.  GDMT was limited by hypotension and ischemic evaluation was not completed due to poor outpatient follow-up, drug abuse and low likelihood of ACS.  He left AMA on 05/08/2023 after he was recommended transfer to a tertiary care center for evaluation by dermatology/rheumatology for his vasculitis.  Today heart failure consulted due to concern for low output heart failure.  Past Medical History:  Diagnosis Date   Arthritis    Atrial fibrillation (HCC)    CHF (congestive heart failure) (HCC)    Diabetes mellitus without complication (HCC)    Gout    Heart attack (HCC) 10/2018   Hyperlipidemia    Hypertension    Morbid obesity (HCC) 12/22/2016   Sleep apnea    had  test twice unable to complete    Current Facility-Administered Medications  Medication Dose Route Frequency Provider Last Rate Last Admin   acetaminophen  (TYLENOL ) tablet 650 mg  650 mg Oral Q4H PRN Akula, Vijaya, MD   650 mg at 05/28/23 9356   apixaban  (ELIQUIS ) tablet 5 mg  5 mg Oral BID Alto Isaiah CROME, NP   5 mg at 05/28/23 9072   aspirin  EC tablet 81 mg  81 mg Oral Q breakfast Alto Isaiah CROME, NP   81 mg at 05/28/23 9073   collagenase  (SANTYL ) ointment 1 Application  1 Application Topical Daily Akula, Vijaya, MD   1 Application at 05/28/23 1626   digoxin  (LANOXIN ) tablet 0.125 mg  0.125 mg Oral Daily Anaijah Augsburger, DO       empagliflozin  (JARDIANCE ) tablet 25 mg  25 mg Oral Daily Alto Isaiah CROME, NP   25 mg at 05/28/23 9072   famotidine  (PEPCID ) tablet 20 mg  20 mg Oral BID Alto Isaiah CROME, NP   20 mg at 05/28/23 9072   fenofibrate  tablet 160 mg  160 mg Oral Daily Alto Isaiah CROME, NP   160 mg at 05/28/23 9072   fluticasone  (FLONASE ) 50 MCG/ACT nasal spray 1 spray  1 spray Each Nare BID PRN Cherlyn Labella, MD       fluticasone  (FLONASE ) 50 MCG/ACT nasal spray 2 spray  2 spray Each Nare Daily Shona Laurence N, DO   2 spray at 05/28/23 9071   furosemide  (LASIX ) injection 80 mg  80 mg Intravenous Q12H Karri Kallenbach, DO       insulin  aspart (novoLOG ) injection 0-9 Units  0-9 Units Subcutaneous TID WC Akula, Vijaya, MD   2 Units at 05/28/23 1627   metolazone  (ZAROXOLYN ) tablet 5 mg  5 mg Oral Once Mahari Strahm, DO       midodrine  (PROAMATINE ) tablet 5 mg  5 mg Oral TID WC Ishani Goldwasser, DO       milrinone  (PRIMACOR ) 20 MG/100 ML (0.2 mg/mL) infusion  0.25 mcg/kg/min Intravenous Continuous Allaya Abbasi, DO       ondansetron  (ZOFRAN ) tablet 4 mg  4 mg Oral Q6H PRN Alto Isaiah CROME, NP       Or   ondansetron  (ZOFRAN ) injection 4 mg  4 mg Intravenous Q6H PRN Alto Isaiah CROME, NP       pantoprazole  (PROTONIX ) EC tablet 40 mg  40 mg Oral Daily Alto Isaiah CROME, NP   40 mg at  05/28/23 9072   silver  sulfADIAZINE  (SILVADENE ) 1 % cream 1 Application  1 Application Topical Daily Cherlyn Labella, MD   1 Application at 05/28/23 1626   sodium chloride  flush (NS) 0.9 % injection 3 mL  3 mL Intravenous Q12H Alto Isaiah CROME, NP   3 mL at 05/28/23 9070    Allergies  Allergen Reactions   Metoprolol      Hypotension       Social History   Socioeconomic History   Marital status: Divorced    Spouse name: Not on file   Number of children: 2   Years of education: Not on file   Highest education level: High school graduate  Occupational History   Occupation: Disability  Tobacco Use   Smoking status: Never   Smokeless tobacco: Never  Vaping Use   Vaping status: Never Used  Substance and Sexual Activity   Alcohol use: Not Currently    Alcohol/week: 12.0 standard drinks of alcohol    Types: 12 Cans of beer per week   Drug use: No   Sexual activity: Yes    Comment: male 8 months partner  Other Topics Concern   Not on file  Social History Narrative   Not on file   Social Drivers of Health   Financial Resource Strain: Low Risk  (05/28/2023)   Overall Financial Resource Strain (CARDIA)    Difficulty of Paying Living Expenses: Not hard at all  Food Insecurity: No Food Insecurity (05/28/2023)   Hunger Vital Sign    Worried About Running Out of Food in the Last Year: Never true    Ran Out of Food in the Last Year: Never true  Transportation Needs: No Transportation Needs (05/28/2023)   PRAPARE - Administrator, Civil Service (Medical): No    Lack of Transportation (Non-Medical): No  Physical Activity: Not on file  Stress: Not on file  Social Connections: Not on file  Intimate Partner Violence: Not At Risk (05/28/2023)   Humiliation, Afraid, Rape, and Kick questionnaire    Fear of Current or Ex-Partner: No    Emotionally Abused: No    Physically Abused: No    Sexually Abused: No      Family History  Problem Relation Age of Onset   Diabetes Mother     Stroke Mother    Early death Father    Heart attack Father        died from heart attack at the age of 19   Alcohol abuse Father    Heart disease Father    Breast cancer Sister    Hypertension Brother    Heart attack Maternal Grandfather  died from heart attack at the age of 3   Early death Paternal Grandfather    Heart attack Paternal Grandfather    Colon cancer Neg Hx    Esophageal cancer Neg Hx    Rectal cancer Neg Hx    Stomach cancer Neg Hx     PHYSICAL EXAM: Vitals:   05/28/23 1140 05/28/23 1613  BP: 106/78 93/76  Pulse: 92 91  Resp: 20 20  Temp: 97.6 F (36.4 C) 97.7 F (36.5 C)  SpO2: 96% 97%   GENERAL: Chronically ill-appearing male in no apparent distress HEENT: There is no scleral icterus.  The mucous membranes are pink and moist.   CHEST: There are no chest wall deformities. There is no chest wall tenderness. Respirations are unlabored.  Lungs-decreased lung sounds at bases CARDIAC:  JVP: 10-12 cm          Normal rate with regular rhythm.  2 out of 6 systolic murmur.  Pulses are 2+ and symmetrical in upper and lower extremities.  3+ pitting edema.  ABDOMEN: Soft, non-tender, non-distended. There are normal bowel sounds.  EXTREMITIES: Warm and well perfused.  NEUROLOGIC: Patient is oriented x3 with no obvious focal neurologic deficits.  PSYCH: Patients affect is appropriate SKIN: Warm and dry; no lesions or wounds.    DATA REVIEW  ECG: 05/28/2023: Normal sinus rhythm with PVCs as per my personal interpretation  ECHO: 05/05/23: LVEF 20% with grade 3 diastolic dysfunction and LV apical thrombus.  RV function at least moderately reduced.  ASSESSMENT & PLAN:  Acute on chronic systolic heart failure -Patient has stage D biventricular failure with unfortunately no options for advanced therapies at this time due to recurrent drug use and poor compliance.  UDS positive for cocaine. -Despite IV diuretics, patient has had minimal urine output. -Start  milrinone  0.25 mcg/kg/min. -Digoxin  125 mcg daily -IV Lasix  80 mg twice daily plus metolazone  5 mg, continue Jardiance  25 mg daily -I suspect patient may become hypotensive with initiation of milrinone .  If that does happen will need to be transferred to 2H while we diurese. -Lactic acid and CMP pending.  2.  LV thrombus -Continue apixaban  5 mg twice daily  3.  NSVT -If NSVT burden increases start amiodarone infusion.  4.  Coronary artery disease -Status post PCI to the LAD and RCA in 2020 -Aspirin  81 mg daily  5.  Polysubstance abuse -Continue apixaban  5 mg twice daily  Hazelynn Mckenny Advanced Heart Failure Mechanical Circulatory Support

## 2023-05-28 NOTE — Progress Notes (Signed)
 Notified Dr Edson Snowball about patient's BP and lethargy new orders placed.

## 2023-05-28 NOTE — TOC Initial Note (Signed)
 Transition of Care [facility]) - Initial/Assessment Note    Patient Details  Name: Evan [name] MRN: [id number] Date of Birth: [birth year]  [facility]) CM/SW Contact:    Waddell Barnie Rama, RN Phone Number: 05/28/2023, 3:30 PM  Clinical Narrative:                 From home with mother, has PCP, Dettinger,  and insurance on file, states has no HH services in place at this time or DME at home.  States daughter  will transport them home at [employer] and family is support system, states gets medications from [facility] in [place].  Pta self ambulatory. He has unaboots on, that were put on by Dr. Harden, he states he is suppose to go to Dr. Crist on Tuesdays to get the unaboots changed, but while he is in the hospital they will do them here.    Expected Discharge Plan: Home w Home Health Services Barriers to Discharge: Continued Medical Work up   Patient Goals and CMS Choice Patient states their goals for this hospitalization and ongoing recovery are:: return home   Choice offered to / list presented to : NA      Expected Discharge Plan and Services In-house Referral: NA Discharge Planning Services: CM Consult Post Acute Care Choice: NA Living arrangements for the past 2 months: Single Family Home                   DME Agency: NA       HH Arranged: NA          Prior Living Arrangements/Services Living arrangements for the past 2 months: Single Family Home Lives with:: Parents (mother) Patient language and need for interpreter reviewed:: Yes Do you feel safe going back to the place where you live?: Yes      Need for Family Participation in Patient Care: Yes (Comment) Care giver support system in place?: Yes (comment)   Criminal Activity/Legal Involvement Pertinent to Current Situation/Hospitalization: No - Comment as needed  Activities of Daily Living   ADL Screening (condition at time of admission) Independently performs ADLs?: Yes (appropriate for developmental age) Is  the patient deaf or have difficulty hearing?: No Does the patient have difficulty seeing, even when wearing glasses/contacts?: No Does the patient have difficulty concentrating, remembering, or making decisions?: No  Permission Sought/Granted Permission sought to share information with : Case Manager Permission granted to share information with : Yes, Verbal Permission Granted              Emotional Assessment Appearance:: Appears stated age Attitude/Demeanor/Rapport: Engaged Affect (typically observed): Appropriate Orientation: : Oriented to Self, Oriented to Place, Oriented to  Time, Oriented to Situation Alcohol / Substance Use: Alcohol Use Psych Involvement: No (comment)  Admission diagnosis:  Peripheral edema [R60.0] Acute HFrEF (heart failure with reduced ejection fraction) (HCC) [I50.21] Acute on chronic congestive heart failure, unspecified heart failure type [facility]) [I50.9] Patient Active Problem List   Diagnosis Date Noted   Acute HFrEF (heart failure with reduced ejection fraction) (HCC) 05/27/2023   Diabetic foot ulcer (HCC) 05/05/2023   History of acute cholecystitis 04/18/23 treated conservatively 05/05/2023   Acute on chronic HFrEF (heart failure with reduced ejection fraction) (HCC) 05/05/2023   Diabetic ulcer of lower leg (HCC) 05/05/2023   Cellulitis in diabetic foot (HCC) 05/05/2023   CHF (congestive heart failure) (HCC) 05/05/2023   Bilateral leg ulcer (HCC) 05/05/2023   Acute on chronic combined systolic and diastolic CHF (congestive heart  failure) (HCC) 05/05/2023   Vasculitis (HCC) 05/05/2023   Abdominal pain 04/19/2023   Elevated brain natriuretic peptide (BNP) level 04/19/2023   Elevated troponin 04/19/2023   Elevated d-dimer 04/19/2023   Type 2 diabetes mellitus with hyperglycemia (HCC) 04/19/2023   Acute cholecystitis 04/18/2023   Hypertension 10/06/2022   Type 2 diabetes mellitus with complications (HCC) 02/15/2021   CAD S/P PCI 12/09/2018   Left  ventricular dysfunction    History of non-ST elevation myocardial infarction (NSTEMI) 11/24/2018   Obesity (BMI 30.0-34.9) 01/05/2018   GERD (gastroesophageal reflux disease) 07/08/2017   Mixed hyperlipidemia 12/22/2016   Essential hypertension 09/22/2016   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) 09/22/2016   Gout 09/22/2016   PCP:  Dettinger, Fonda LABOR, MD Pharmacy:   [facility] [address] [place] [address] Phone: [phone] Fax: [phone]  DARRYLE LONG - [facility] [address] [place] [address] Phone: [phone] Fax: [phone]     Social Drivers of Health (SDOH) Social History: SDOH Screenings   Food Insecurity: No Food Insecurity (05/28/2023)  Housing: Low Risk  (05/28/2023)  Transportation Needs: No Transportation Needs (05/28/2023)  Utilities: Not At Risk (05/28/2023)  Alcohol Screen: Low Risk  (05/28/2023)  Depression (PHQ2-9): Medium Risk (05/21/2023)  Financial Resource Strain: Low Risk  (05/28/2023)  Tobacco Use: Low Risk  (05/26/2023)   SDOH Interventions: Housing Interventions: Intervention Not Indicated Transportation Interventions: Intervention Not Indicated Alcohol Usage Interventions: Intervention Not Indicated (Score <7) Financial Strain Interventions: Intervention Not Indicated   Readmission Risk Interventions    05/07/2023   11:32 AM  Readmission Risk Prevention Plan  Transportation Screening Complete  PCP or Specialist Appt within 5-7 Days Complete  Home Care Screening Complete  Medication Review (RN CM) Complete

## 2023-05-28 NOTE — Progress Notes (Addendum)
 [facility]                                                                               Kenneth Gruenberg, is a 58 y.o. male, DOB - 28-Jan-1966, FMW:[id number] Admit date - 05/26/2023    Outpatient Primary MD for the patient is Dettinger, Fonda LABOR, MD  LOS - 1  days    Brief summary   Evan [name] is a 58 y.o. male with medical history significant of hypertension, dyslipidemia, type 2 DM,  , LV thrombus on Eliquis , PAF,CAD with anterior lateral MI in 2020,  S/P  intervention to the proximal LAD and staged RCA intervention, ischemic cardiomyopathy with LV EF Of 20%,with multiple hospitalizations for CHF now sent from HF office to ED for evaluation of acute CHF.     Assessment & Plan    Assessment and Plan:    Acute on chronic systolic heart failure./ Ischemic cardiomyopathy/  Last EF of 20% with associated global hypokinesis, and grade 3 diastolic dysfunction with echo May 05, 2023. Elevated BNP is 1354.  Started on IV lasix  40 mg ( one dose yesterday), on hold this morning due to hypotension.  Strict intake and output , daily weights.  Diuresed about 2.2 lit yesterday.  Continue with lasix , spironolactone , coreg  6.25 mg BID, cozaar  25 mg daily.   Elevated troponins from demand ischemia secondary to decompensated heart failure.    Lv Thrombus  On Eliquis .   Paroxysmal Atrial fibrillation:  Rate controlled . On coreg  and eliquis .    Type 2 Diabetes mellitus:  Last A1c is 7.3 %.  CBG (last 3)  No results for input(s): GLUCAP in the last 72 hours.   H/o CAD  S/P DES to proximal LAD and staged RCA.  Pt currently denies any chest pain.  Resume aspirin  81 mg daily and lipitor  80 mg daily.   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  Orthopedics on board.SABRA ACE  wraps in place.   Polysubstance abuse:  Positive for cocaine.  TOC for resources.   Elevated liver enzymes  Liver congestion.     Hyponatremia:  From fluid over load.    Mild AKI:  Improving with diuresis.    Mild leukocytosis.  Recheck CBC in am.   Body mass index is 32.35 kg/m. Obesity    Estimated body mass index is 32.35 kg/m as calculated from the following:   Height as of this encounter: 6' 5 (1.956 m).   Weight as of this encounter: 123.7 kg.  Code Status: full code.  DVT Prophylaxis:   apixaban  (ELIQUIS ) tablet 5 mg   Level of Care: Level of care: Telemetry Cardiac Family Communication: none at bedside.   Disposition Plan:     Remains inpatient appropriate:  IV lasix .   Procedures:  None.   Consultants:   Cardiology.   Antimicrobials:   Anti-infectives (From admission, onward)    None        Medications  Scheduled Meds:  apixaban   5 mg Oral BID   aspirin  EC  81 mg Oral Q breakfast   atorvastatin   80 mg Oral Daily   carvedilol   6.25 mg Oral BID WC   empagliflozin   25 mg Oral Daily   famotidine   20 mg Oral BID   fenofibrate   160 mg Oral Daily   fluticasone   2 spray Each Nare Daily   furosemide   60 mg Intravenous Q12H   losartan   25 mg Oral QHS   pantoprazole   40 mg Oral Daily   sodium chloride  flush  3 mL Intravenous Q12H   spironolactone   12.5 mg Oral Daily   Continuous Infusions: PRN Meds:.acetaminophen , ondansetron  **OR** ondansetron  (ZOFRAN ) IV    Subjective:   Evan [name] was seen and examined today.  No new complaints today.   Objective:   Vitals:   05/28/23 0857 05/28/23 0900 05/28/23 0931 05/28/23 1140  BP: 93/69 93/69 90/70  106/78  Pulse:    92  Resp:    20  Temp:    97.6 F (36.4 C)  TempSrc:    Oral  SpO2:    96%  Weight:      Height:        Intake/Output Summary (Last 24 hours) at 05/28/2023 1230 Last data filed at 05/28/2023 0845 Gross per 24 hour  Intake 600 ml  Output 1425 ml  Net -825 ml    Filed Weights   05/26/23 1738 05/27/23 1849 05/28/23 0556  Weight: 125.6 kg 124.1 kg 123.7 kg     Exam General exam: Appears calm and comfortable  Respiratory system: Clear to auscultation. Respiratory effort normal. Cardiovascular system: S1 & S2 heard, RRR.  Gastrointestinal system: Abdomen is nondistended, soft and nontender.  Central nervous system: Alert and oriented.  Extremities: lower extremity edema.  Skin:  nodular erythematous lesions on the lower extremities.  Psychiatry: Mood & affect appropriate.   Data Reviewed:  I have personally reviewed following labs and imaging studies   CBC Lab Results  Component Value Date   WBC 12.5 (H) 05/27/2023   RBC 5.93 (H) 05/27/2023   HGB 15.8 05/27/2023   HCT 48.5 05/27/2023   MCV 81.8 05/27/2023   MCH 26.6 05/27/2023   PLT 302 05/27/2023   MCHC 32.6 05/27/2023   RDW 15.9 (H) 05/27/2023   LYMPHSABS 1.6 05/27/2023   MONOABS 0.9 05/27/2023   EOSABS 0.1 05/27/2023   BASOSABS 0.1 05/27/2023     Last metabolic panel Lab Results  Component Value Date   NA 131 (L) 05/28/2023   K 3.5 05/28/2023   CL 97 (L) 05/28/2023   CO2 24 05/28/2023   BUN 29 (H) 05/28/2023   CREATININE 1.33 (H) 05/28/2023   GLUCOSE 168 (H) 05/28/2023   GFRNONAA >60 05/28/2023   GFRAA 87 05/24/2020   CALCIUM  8.1 (L) 05/28/2023   PHOS 4.0 05/07/2023   PROT 6.5 05/27/2023   ALBUMIN 3.0 (L) 05/27/2023   LABGLOB 2.6 05/21/2023   AGRATIO 1.1 (L) 10/20/2022   BILITOT 1.0 05/27/2023   ALKPHOS 88 05/27/2023   AST 71 (H) 05/27/2023   ALT 94 (H) 05/27/2023   ANIONGAP 10 05/28/2023    CBG (last 3)  No results for input(s): GLUCAP in the last 72 hours.    Coagulation Profile: No results for input(s): INR, PROTIME in the last 168 hours.   Radiology Studies: DG Chest 2 View Result Date: 05/26/2023 CLINICAL DATA:  Shortness of breath. EXAM: CHEST - 2 VIEW COMPARISON:  Chest x-ray 05/21/2023 FINDINGS: The heart is borderline enlarged  but  stable. The mediastinal and hilar contours are within normal limits. No acute pulmonary findings. No pulmonary edema or pleural effusions. No pulmonary infiltrates or pneumothorax. The bony thorax is intact. IMPRESSION: Borderline cardiomegaly but no acute pulmonary findings. Electronically Signed   By: MYRTIS Stammer M.D.   On: 05/26/2023 18:52       Elgie Butter M.D. [facility] 05/28/2023, 12:30 PM  Available via Epic secure chat 7am-7pm After 7 pm, please refer to night coverage provider listed on amion.

## 2023-05-28 NOTE — Consult Note (Addendum)
 Cardiology Consultation   Patient ID: [name] MRN: [id number]; DOB: April 15, 1966  Admit date: 05/26/2023 Date of Consult: 05/28/2023  PCP:  Dettinger, Fonda LABOR, MD   [facility] Providers Cardiologist:  Dorn Lesches, MD   {  Patient Profile:   Evan [name] is a 58 y.o. male with a hx of CAD s/p anterolateral MI in 11/2018 with intervention to proximal LAD and staged RCA intervention, hypertension, hyperlipidemia, ischemic cardiomyopathy (LVEF improved from 20-25% to 35-40%), LV thrombus (Dec 2024) started on anticoagulation, PAF, morbid obesity, OSA, type 2 diabetes, polysubstance abuse with cocaine, prurigo nodularis, who is being seen 05/28/2023 for the evaluation of CHF at the request of Dr Cherlyn.  History of Present Illness:   Evan [name] with above complex PMH presented to [facility] ED on 1/2 for c/o dyspnea on exertion. Lasix  was increased to 40mg  daily for a week. He followed up with cardiology 05/25/22, saw NP West, c/o increased SOB, leg edema, abdominal pain, weight gain of 23 pounds since 05/08/23, and progressing scrotal and abdominal edema. He felt Lasix  40mg  daily has not been helping. He also self discontinued Eliquis  2-3 days ago because he felt uneasy. He was advised to go to ER due to marked volume overload on exam. At ER, he mentioned he took Coreg  at 12.5mg  instead of 3.125mg  a few days ago at home, had some dizziness.  He thought this was due to Eliquis  and thus stopped it.   He states he has been feeling very poor for a long time. He noted LE edema progressing to abdomen and scrotum for at least 5 weeks now. He is very winded with activity, now with dyspnea at rest, + orthopnea. He denied chest pain. He still sues cocaine but states he stopped for few days.   Diagnostic from 05/26/23 showed Na 133, Cr 1.35, BUN 32, GFR >60. BNP 1354. CBC unremarkable. UDS + cocaine. CXR no acute finding. He was admitted to medicine, IV Lasix  was started, he had 2.2L UOP so  far (not accurate), lasix  held today due to hypotension. Cardiology is consulted today for CHF. He states he is urinating, swelling much improved, but feeling dizzy today. He continue to feel very Shortness of breath at rest.    Per chart review, he had anterolateral MI in 11/2018 with intervention to proximal LAD and staged RCA intervention. He suffered ischemic cardiomyopathy with LVEF 20 to 25% at time of MI with improved LVEF 35 to 40%.   He has been having recurrent hospitalizations [facility] due to CHF symptoms.   He was hospitalized from 04/18/23 with acute cholecystitis. He refused HIDA scan and cholecystectomy.   He was last hospitalized here until 05/08/2023 for worsening leg wound and acute CHF. He was diagnosed with vasculitis by ID and was given steroid. Echo from 05/05/23 showed LVEF down to 20%, global hypokinesis, grade III DD, LV apex thrombus, moderately reduced RV, PASP 59.6 mmHg, mod LAE, mild RAE, mod MR, IVC dilated. It was concerned he had infarct versus cocaine induced cardiomyopathy. He was given IV Lasix  80mg  BID, maintained on jardiance , spironolactone . GDMT limited by hypotension. Ischemic workup not recommended given he was lacking angina symptoms. He was recommended Eliquis  for LV thrombus. He was recommended transfer to tertiary care center for further evaluation by dermatology and rheumatology.  He left AMA on 05/08/23.   Past Medical History:  Diagnosis Date   Arthritis    Atrial fibrillation (HCC)  CHF (congestive heart failure) (HCC)    Diabetes mellitus without complication (HCC)    Gout    Heart attack (HCC) 10/2018   Hyperlipidemia    Hypertension    Morbid obesity (HCC) 12/22/2016   Sleep apnea    had test twice unable to complete    Past Surgical History:  Procedure Laterality Date   ANKLE ARTHROSCOPY Right    COLONOSCOPY WITH PROPOFOL  N/A 09/18/2020   Procedure: COLONOSCOPY WITH PROPOFOL ;  Surgeon: Legrand Victory LITTIE DOUGLAS,  MD;  Location: [facility];  Service: Gastroenterology;  Laterality: N/A;  09-18-2020   CORONARY STENT INTERVENTION N/A 11/24/2018   Procedure: CORONARY STENT INTERVENTION;  Surgeon: Darron Deatrice LABOR, MD;  Location: [facility];  Service: Cardiovascular;  Laterality: N/A;  prox and mid LAD   CORONARY STENT INTERVENTION N/A 11/26/2018   Procedure: CORONARY STENT INTERVENTION;  Surgeon: Darron Deatrice LABOR, MD;  Location: [facility] CV [facility];  Service: Cardiovascular;  Laterality: N/A;   CYST REMOVAL HAND     KNEE ARTHROSCOPY Left    LEFT HEART CATH AND CORONARY ANGIOGRAPHY N/A 11/24/2018   Procedure: LEFT HEART CATH AND CORONARY ANGIOGRAPHY;  Surgeon: Darron Deatrice LABOR, MD;  Location: [facility];  Service: Cardiovascular;  Laterality: N/A;   POLYPECTOMY  09/18/2020   Procedure: POLYPECTOMY;  Surgeon: Legrand Victory LITTIE DOUGLAS, MD;  Location: [facility];  Service: Gastroenterology;;     Home Medications:  Prior to Admission medications   Medication Sig Start Date End Date Taking? Authorizing Provider  acetaminophen  (TYLENOL ) 325 MG tablet Take 2 tablets (650 mg total) by mouth every 6 (six) hours as needed for mild pain (pain score 1-3) or fever (or Fever >/= 101). 04/21/23  Yes Pearlean Manus, MD  aspirin  EC 81 MG tablet Take 1 tablet (81 mg total) by mouth daily with breakfast. Swallow whole. 04/22/23  Yes Emokpae, Courage, MD  atorvastatin  (LIPITOR ) 80 MG tablet Take 1 tablet (80 mg total) by mouth daily. 04/21/23  Yes Pearlean Manus, MD  carvedilol  (COREG ) 6.25 MG tablet Take 1 tablet (6.25 mg total) by mouth 2 (two) times daily with a meal. 05/21/23  Yes Dettinger, Fonda LABOR, MD  collagenase  (SANTYL ) 250 UNIT/GM ointment Apply 1 Application topically daily. 05/21/23  Yes Dettinger, Fonda LABOR, MD  empagliflozin  (JARDIANCE ) 25 MG TABS tablet Take 1 tablet (25 mg total) by mouth daily. 05/21/23  Yes Dettinger, Fonda LABOR, MD  famotidine  (PEPCID ) 20 MG tablet Take 1 tablet (20 mg total) by mouth 2 (two)  times daily. 04/21/23  Yes Emokpae, Courage, MD  fenofibrate  (TRICOR ) 145 MG tablet Take 1 tablet (145 mg total) by mouth daily. 08/07/22  Yes Dettinger, Fonda LABOR, MD  fluticasone  (FLONASE ) 50 MCG/ACT nasal spray Place 1 spray into both nostrils 2 (two) times daily as needed for allergies or rhinitis. 05/21/23  Yes Dettinger, Fonda LABOR, MD  furosemide  (LASIX ) 40 MG tablet Take 1 tablet (40 mg total) by mouth daily. 05/21/23  Yes Dettinger, Fonda LABOR, MD  hydrOXYzine  (VISTARIL ) 25 MG capsule Take 1 capsule (25 mg total) by mouth every 8 (eight) hours as needed. 05/21/23  Yes Dettinger, Fonda LABOR, MD  losartan  (COZAAR ) 25 MG tablet Take 1 tablet (25 mg total) by mouth daily. Patient taking differently: Take 25 mg by mouth at bedtime. 08/07/22  Yes Dettinger, Fonda LABOR, MD  pantoprazole  (PROTONIX ) 40 MG tablet Take 40 mg by mouth daily. 04/21/23  Yes [provider]  silver  nitrate applicators 75-25 % applicator Apply topically  3 (three) times a week. 05/22/23  Yes Dettinger, Fonda LABOR, MD  silver  sulfADIAZINE  (SILVADENE ) 1 % cream Apply 1 Application topically daily. 05/21/23  Yes Dettinger, Fonda LABOR, MD  spironolactone  (ALDACTONE ) 25 MG tablet Take 0.5 tablets (12.5 mg total) by mouth daily. 04/21/23  Yes Emokpae, Courage, MD  BD PEN NEEDLE NANO 2ND GEN 32G X 4 MM MISC Use daily with insulin  Dx E11.9, Z79.4 06/22/20   Dettinger, Fonda LABOR, MD  Blood Glucose Monitoring Suppl (ACCU-CHEK AVIVA PLUS) w/Device KIT 1 each by Does not apply route 4 (four) times daily. 06/17/21   Dettinger, Fonda LABOR, MD  Blood Glucose Monitoring Suppl (ACCU-CHEK AVIVA) device Test BS BID Dx E11.69 05/24/20   Dettinger, Fonda LABOR, MD  glucose blood (ACCU-CHEK GUIDE) test strip Check BS in the morning and at bedtime Dx E11.8 07/01/21   Dettinger, Joshua A, MD    Inpatient Medications: Scheduled Meds:  apixaban   5 mg Oral BID   aspirin  EC  81 mg Oral Q breakfast   atorvastatin   80 mg Oral Daily   collagenase   1 Application Topical Daily    empagliflozin   25 mg Oral Daily   famotidine   20 mg Oral BID   fenofibrate   160 mg Oral Daily   fluticasone   2 spray Each Nare Daily   furosemide   40 mg Intravenous Q12H   insulin  aspart  0-9 Units Subcutaneous TID WC   pantoprazole   40 mg Oral Daily   silver  sulfADIAZINE   1 Application Topical Daily   sodium chloride  flush  3 mL Intravenous Q12H   Continuous Infusions:  PRN Meds: acetaminophen , fluticasone , ondansetron  **OR** ondansetron  (ZOFRAN ) IV  Allergies:    Allergies  Allergen Reactions   Metoprolol      Hypotension     Social History:   Social History   Socioeconomic History   Marital status: Divorced    Spouse name: Not on file   Number of children: 2   Years of education: Not on file   Highest education level: High school graduate  Occupational History   Occupation: Disability  Tobacco Use   Smoking status: Never   Smokeless tobacco: Never  Vaping Use   Vaping status: Never Used  Substance and Sexual Activity   Alcohol use: Not Currently    Alcohol/week: 12.0 standard drinks of alcohol    Types: 12 Cans of beer per week   Drug use: No   Sexual activity: Yes    Comment: male 8 months partner  Other Topics Concern   Not on file  Social History Narrative   Not on file   Social Drivers of Health   Financial Resource Strain: Low Risk  (05/28/2023)   Overall Financial Resource Strain (CARDIA)    Difficulty of Paying Living Expenses: Not hard at all  Food Insecurity: No Food Insecurity (05/28/2023)   Hunger Vital Sign    Worried About Running Out of Food in the Last Year: Never true    Ran Out of Food in the Last Year: Never true  Transportation Needs: No Transportation Needs (05/28/2023)   PRAPARE - Administrator, Civil Service (Medical): No    Lack of Transportation (Non-Medical): No  Physical Activity: Not on file  Stress: Not on file  Social Connections: Not on file  Intimate Partner Violence: Not At Risk (05/28/2023)   Humiliation,  Afraid, Rape, and Kick questionnaire    Fear of Current or Ex-Partner: No    Emotionally Abused: No    Physically Abused:  No    Sexually Abused: No    Family History:    Family History  Problem Relation Age of Onset   Diabetes Mother    Stroke Mother    Early death Father    Heart attack Father        died from heart attack at the age of 46   Alcohol abuse Father    Heart disease Father    Breast cancer Sister    Hypertension Brother    Heart attack Maternal Grandfather        died from heart attack at the age of 48   Early death Paternal Grandfather    Heart attack Paternal Grandfather    Colon cancer Neg Hx    Esophageal cancer Neg Hx    Rectal cancer Neg Hx    Stomach cancer Neg Hx      Review of Systems  Cardiovascular:  Positive for leg swelling, orthopnea and paroxysmal nocturnal dyspnea. Negative for chest pain, claudication, irregular heartbeat, near-syncope, palpitations and syncope.  Respiratory:  Positive for cough, shortness of breath and snoring.   Hematologic/Lymphatic: Negative for bleeding problem.  Neurological:  Positive for dizziness and light-headedness.  Psychiatric/Behavioral:         Poor insight in his medical condition   Physical Exam/Data:   Vitals:   05/28/23 0900 05/28/23 0931 05/28/23 1140 05/28/23 1613  BP: 93/69 90/70 106/78 93/76  Pulse:   92 91  Resp:   20 20  Temp:   97.6 F (36.4 C) 97.7 F (36.5 C)  TempSrc:   Oral Oral  SpO2:   96% 97%  Weight:      Height:        Intake/Output Summary (Last 24 hours) at 05/28/2023 1724 Last data filed at 05/28/2023 0845 Gross per 24 hour  Intake 600 ml  Output 725 ml  Net -125 ml      05/28/2023    5:56 AM 05/27/2023    6:49 PM 05/26/2023    5:38 PM  Last 3 Weights  Weight (lbs) 272 lb 12.8 oz 273 lb 11.2 oz 277 lb  Weight (kg) 123.741 kg 124.15 kg 125.646 kg     Body mass index is 32.35 kg/m.   Vitals:  Vitals:   05/28/23 1140 05/28/23 1613  BP: 106/78 93/76  Pulse: 92 91  Resp:  20 20  Temp: 97.6 F (36.4 C) 97.7 F (36.5 C)  SpO2: 96% 97%   General Appearance: In no apparent distress, laying in bed, well nourished  HEENT: Normocephalic, atraumatic. EOMs intact. Sclera anicteric. Oropharynx is clear, tongue midline. Dentition is as expected for age. Neck: Supple, trachea midline, no JVDs Cardiovascular: Regular rate and rhythm, normal S1-S2,  no murmur/rub/gallop, S3/S4 Respiratory: Resting breathing unlabored, lungs sounds clear to auscultation bilaterally, no use of accessory muscles. On room air.  No wheezes, rales or rhonchi.   Gastrointestinal: Bowel sounds positive, abdomen soft, non-tender, non-distended. No mass or organomegaly.  Extremities: Able to move all extremities in bed without difficulty, no edema/cyanosis/clubbing, DP/PT pulses were 2+ and equal bilaterally Genitourinary: No CVA tenderness / genital exam not performed Musculoskeletal: Normal muscle bulk and tone, muscle strength 5/5 throughout, no limited range of motion, no swollen or erythematous joints Skin: Intact, warm, dry. No rashes or petechiae noted in exposed areas.  Neurologic: Alert, oriented to person, place and time. Fluent speech, no facial droop, no cognitive deficit, no pronator drift, no gross sensory deficit, no focal muscle weakness, no gross focal neuro  deficit Psychiatric: Normal affect. Mood is appropriate.  External device: Foley/CVL/Port/Dialysis catheter  EKG:  The EKG was personally reviewed and demonstrates:    EKG from 05/26/23 showed sinus rhythm 97 bpm, NSVT, PVCs  Telemetry:  Telemetry was personally reviewed and demonstrates:    Sinus rhythm, NSVT lasting up to 6 seconds   Relevant CV Studies:   Echo from 05/05/23:   1. Left ventricular ejection fraction, by estimation, is 20%. The left  ventricle has severely decreased function. The left ventricle demonstrates  global hypokinesis. The left ventricular internal cavity size was mildly  dilated. Left  ventricular  diastolic parameters are consistent with Grade III diastolic dysfunction  (restrictive). There was a thrombus at the LV apex.   2. Right ventricular systolic function is moderately reduced. The right  ventricular size is mildly enlarged. There is moderately elevated  pulmonary artery systolic pressure. The estimated right ventricular  systolic pressure is 59.6 mmHg.   3. Left atrial size was moderately dilated.   4. Right atrial size was mildly dilated.   5. The mitral valve is abnormal. Moderate mitral valve regurgitation. No  evidence of mitral stenosis.   6. The aortic valve is tricuspid. There is mild calcification of the  aortic valve. Aortic valve regurgitation is not visualized. No aortic  stenosis is present.   7. The inferior vena cava is dilated in size with <50% respiratory  variability, suggesting right atrial pressure of 15 mmHg.    Laboratory Data:  High Sensitivity Troponin:   Recent Labs  Lab 05/27/23 0644 05/27/23 1003  TROPONINIHS 39* 42*     Chemistry Recent Labs  Lab 05/26/23 1740 05/27/23 1949 05/28/23 0258  NA 133* 130* 131*  K 4.5 4.5 3.5  CL 101 97* 97*  CO2 22 23 24   GLUCOSE 174* 210* 168*  BUN 32* 34* 29*  CREATININE 1.35* 1.49* 1.33*  CALCIUM  8.2* 8.5* 8.1*  MG  --  2.2  --   GFRNONAA >60 54* >60  ANIONGAP 10 10 10     Recent Labs  Lab 05/27/23 1949  PROT 6.5  ALBUMIN 3.0*  AST 71*  ALT 94*  ALKPHOS 88  BILITOT 1.0   Lipids No results for input(s): CHOL, TRIG, HDL, LABVLDL, LDLCALC, CHOLHDL in the last 168 hours.  Hematology Recent Labs  Lab 05/26/23 1740 05/27/23 1003  WBC 9.8 12.5*  RBC 5.52 5.93*  HGB 14.6 15.8  HCT 45.7 48.5  MCV 82.8 81.8  MCH 26.4 26.6  MCHC 31.9 32.6  RDW 15.6* 15.9*  PLT 280 302   Thyroid  No results for input(s): TSH, FREET4 in the last 168 hours.  BNP Recent Labs  Lab 05/26/23 1740  BNP 1,354.6*    DDimer No results for input(s): DDIMER in the last 168  hours.   Radiology/Studies:  DG Chest 2 View Result Date: 05/26/2023 CLINICAL DATA:  Shortness of breath. EXAM: CHEST - 2 VIEW COMPARISON:  Chest x-ray 05/21/2023 FINDINGS: The heart is borderline enlarged but stable. The mediastinal and hilar contours are within normal limits. No acute pulmonary findings. No pulmonary edema or pleural effusions. No pulmonary infiltrates or pneumothorax. The bony thorax is intact. IMPRESSION: Borderline cardiomegaly but no acute pulmonary findings. Electronically Signed   By: MYRTIS Stammer M.D.   On: 05/26/2023 18:52     Assessment and Plan:   Acute on chronic systolic heart failure  - BNP 8645 (was 1045) - CXR no acute finding - Echo from 05/05/23 showed LVEF down to 20%, global hypokinesis,  grade III DD, LV apex thrombus, moderately reduced RV, PASP 59.6 mmHg, mod LAE, mild RAE, mod MR, IVC dilated. - Last dose of lasix  010/12/2023, UOP 2.2L (not accurate), Cr 1.35> 1.49 >1.33, lasix  stopped today due to hypotension 88/73 - will check lactic acid, will need PICC for COOX, concern for low output CHF, likely  need inotrope support and AHF team  - GDMT: Stop coreg , losartan , spironolactone  for now, continue jardiance     NSVT - Keep Mag >2 and K >4 -Likely driven by his underlying cardiomyopathy and acute decompensated heart failure - BP low, stopped coreg   CAD s/p ostial and mid LAD stent  and RCA stent 11/24/18 - Hs trop low flat - LVEF down to 20% since Dec 2024 -Dyspnea on exertion can be angina equivalent, consider repeat heart cath when more stable   LV thrombus - continue Eliquis    AKI Cocaine abuse  Vasculitis  - per primary team   Risk Assessment/Risk Scores:   New [place]  Heart Association (NYHA) Functional Class NYHA Class IV   For questions or updates, please contact [facility] Please consult [email or web address] for contact info under    Signed, Xika Zhao, NP  05/28/2023 5:24 PM  ADDENDUM:   Patient seen and examined with  Xika Zhao, NP .  I personally taken a history, examined the patient, reviewed relevant notes,  laboratory data / imaging studies.  I performed a substantive portion of this encounter and formulated the important aspects of the plan.  I agree with the APP's note, impression, and recommendations; however, I have edited the note to reflect changes or salient points.   Patient seen and examined independently at bedside. Sitting upright due to dyspnea at rest. Volume overloaded on physical examination. Denies anginal chest pain.  Cardiology was asked to consult to help manage his cardiomyopathy and acute heart failure exacerbation.  PHYSICAL EXAM: Today's Vitals   05/28/23 0900 05/28/23 0931 05/28/23 1140 05/28/23 1613  BP: 93/69 90/70 106/78 93/76  Pulse:   92 91  Resp:   20 20  Temp:   97.6 F (36.4 C) 97.7 F (36.5 C)  TempSrc:   Oral Oral  SpO2:   96% 97%  Weight:      Height:      PainSc:   Asleep    Body mass index is 32.35 kg/m.   Net IO Since Admission: -1,185 mL [05/28/23 1748]  Filed Weights   05/26/23 1738 05/27/23 1849 05/28/23 0556  Weight: 125.6 kg 124.1 kg 123.7 kg    Physical Exam  Constitutional: He appears chronically ill.  Appears older than stated age, not in acute distress  HENT:  Dry mucous membranes,  Neck: JVD present.  Cardiovascular: Regular rhythm, S1 normal and S2 normal. Tachycardia present. Exam reveals gallop. Exam reveals no S3 and no S4.  No murmur heard. Pulmonary/Chest: Effort normal. No stridor. He has no wheezes. He has bibasilar rales. He exhibits no tenderness.  Abdominal: Soft. Bowel sounds are normal. He exhibits no distension. There is no abdominal tenderness.  Swelling  Musculoskeletal:        General: Edema (Extending to the thighs bilaterally and abdomen/scrotum) present.     Cervical back: Neck supple.  Neurological: He is alert and oriented to person, place, and time. He has intact cranial nerves (2-12).  Skin: Skin is cool  and dry. Rash (History of vasculitis, question IVD abuse) noted.    EKG: (personally reviewed by me) 05/26/2023: Sinus rhythm, 97 bpm, right axis, anterior infarct,  ventricular run, without underlying injury pattern.  Telemetry: (personally reviewed by me) Sinus rhythm with salvos of NSVT, longest episode approximately 15 beats.   Impression:  Acute decompensated biventricular heart failure, stage C, NYHA class IIIb/IV --with concerns for developing cardiogenic shock Cardiomyopathy, felt to be ischemic. Nonsustained ventricular tachycardia-on telemetry Coronary artery disease, prior PCI, without angina pectoris Left ventricular thrombus -diagnosed December 2024 Pulmonary hypertension, per echo History of paroxysmal atrial fibrillation, currently sinus rhythm. OSA. Cocaine use-UDS positive  Recommendations:  Patient presents to the hospital with volume overload which is ongoing for the last several weeks.  Cardiology is consulted for acute decompensated heart failure management.  On physical examination patient is sitting upright due to dyspnea, significantly volume overloaded on physical examination, cool to touch in the distal extremities, lower extremities wrapped in Unna boots.  Hemodynamically blood pressures are soft.   Net IO Since Admission: -1,185 mL [05/28/23 1755] So far has received Aldactone  12.5 mg p.o. daily, Jardiance  25 mg p.o. daily, and carvedilol  6.25 mg x 1. My concern is that he is developing cardiogenic shock and poor perfusion. Check transaminitis, lactic acid, and electrolytes Due to poor reserve and acute decompensated heart failure he is having salvos of NSVT on telemetry.  He ideally needs inotropic support and pressor support to help facilitate diuresis and once euvolemic uptitrate GDMT. Once stable consider left and right heart catheterization to reevaluate for obstructive disease/patency of prior interventions.  It is in my best medical judgment that  given his comorbidities, noncompliance, cocaine abuse he is not a candidate for advanced heart failure therapy until these modifiable risk factors and social determinants are addressed.  However, I spoke to Dr. Gardenia from advanced heart failure team to help manage this patient due to concerns for developing cardiogenic shock as he is at increased risk for arrhythmic event. In addition, it would be difficult this time of the day to get a PICC line on general medical ward to evaluate his Co-ox, CVP. Recommend keeping MAP >70 mmHg and Co-ox >60%.   Will hold off on diuretics, inotrope support, pressor support until labs come back and advanced heart failure team has a chance to evaluate the patient.  Patient is still full code and states the next of kin would be her daughter.  CRITICAL CARE Performed by: Madonna Large   Total critical care time: 42 minutes   Critical care time was exclusive of separately billable procedures and treating other patients.   Critical care was necessary to treat or prevent imminent or life-threatening deterioration due to concerns for acute decompensated heart failure evolving to cardiogenic shock.   Critical care was time spent personally by me on the following activities: development of treatment plan with patient and/or surrogate as well as nursing, discussions with consultants, evaluation of patient's response to treatment, examination of patient, obtaining history from patient or surrogate, ordering and performing treatments and interventions, ordering and review of laboratory studies, ordering and review of radiographic studies, pulse oximetry and re-evaluation of patient's condition.   This note was created using a voice recognition software as a result there may be grammatical errors inadvertently enclosed that do not reflect the nature of this encounter. Every attempt is made to correct such errors.   Madonna Large, DO, [facility]  [address] [place], [place] [address] Pager: [phone] Office: [phone] 05/28/2023 5:48 PM

## 2023-05-28 NOTE — Progress Notes (Signed)
 Heart Failure Stewardship Pharmacist Progress Note   PCP: Dettinger, Fonda LABOR, MD PCP-Cardiologist: Dorn Lesches, MD    HPI:  58 yo M with PMH of HTN, HLD, CAD, CHF, afib, and polysubstance use.   Admitted 11/2018 with late presenting anterior STEMI. Found to have severe 2 vessel disease. S/p 2 DES in ostial and mid LAD and underwent staged mid RCA PCI 2 days later. ECHO with LVEF 25-30%. Repeat ECHO 02/2019 with EF slightly improved to 35-40%.  In the ED on 03/23/23 with shortness of breath. Recently ran out of lasix . BNP 880. Lasix  was increased to 40 mg daily x 5 days then reduce back to 20 mg daily.  Seen back in office 04/13/23 and still was volume overloaded. Lasix  increased to 40 mg daily.  Hospitalized 11/30-12/3 with acute cholecystitis. Readmitted 12/16 with wound infection. BNP 1000 and ECHO showed LVEF 20%, G3DD, RV moderately reduced, moderate MR. Left AMA.   He was then seen in the ED on 12/25, 12/26, 12/28, and 1/2 for shortness of breath and LE edema.   Was seen by outpatient cardiology on 1/7 and was complaining of shortness of breath, weight gain, and LE edema. He was taking furosemide  40 mg daily without improvement. Weight was up about 20 lbs. He was referred to the ED for further evaluation. BNP 1354.6.  Patient is sleeping. Family/friends in the room state he was still experiencing shortness of breath before he fell asleep. His legs are wrapped and previously stated that his wounds were painful. Reports that he has a difficult time getting medications from [facility] - transitioned his pharmacy to [facility] mail order.   Current HF Medications: Diuretic: furosemide  60 mg IV BID Beta Blocker: carvedilol  6.25 mg BID ACE/ARB/ARNI: losartan  25 mg qhs MRA: spironolactone  12.5 mg daily SGLT2i: Jardiance  25 mg daily  Prior to admission HF Medications: Diuretic: furosemide  40 mg daily Beta blocker: carvedilol  6.25 mg BID ACE/ARB/ARNI: losartan  25 mg qhs MRA: spironolactone  12.5  mg daily SGLT2i: Jardiance  25 mg daily  Pertinent Lab Values: Serum creatinine 1.33, BUN 29, Potassium 3.5, Sodium 131, BNP 1354.6, Magnesium  2.2, A1c 7.3   Vital Signs: Weight: 272 lbs (admission weight: 277 lbs) Blood pressure: 80-100/70s  Heart rate: 80-90s  I/O: net -1.3L yesterday  Medication Assistance / Insurance Benefits Check: Does the patient have prescription insurance?  Yes Type of insurance plan: [place] Medicaid  Outpatient Pharmacy:  [facility] Is the patient willing to use [facility] TOC pharmacy at discharge? Yes Is the patient willing to transition their outpatient pharmacy to utilize a [facility] outpatient pharmacy?   Yes - cannot get medications regularly from [facility]. Interested in switching to [facility] mail order.    Assessment: 1. Acute on chronic systolic CHF (LVEF 20%), due to ICM. NYHA class III symptoms. - Continue furosemide  60 mg IV BID. Good output yesterday. Strict I/Os and daily weights. Keep K>4 and Mg>2.  - Continue carvedilol  6.25 mg BID - On losartan  25 mg at bedtime - consider optimizing to Entresto 24/26 mg BID before discharge once BP improves - Continue spironolactone  12.5 mg daily - Continue Jardiance  25 mg daily   Plan: 1) Medication changes recommended at this time: - Continue IV diuresis  2) Patient assistance: - None pending, has [place] Medicaid - Transitioned pharmacy to [facility] mail order to ensure that he is able to get his medications regularly  3)  Education  - Initial education complete - Full education to be completed prior to discharge  Duwaine Plant, PharmD, BCPS Heart Failure Stewardship Pharmacist Phone [phone]

## 2023-05-29 DIAGNOSIS — F1490 Cocaine use, unspecified, uncomplicated: Secondary | ICD-10-CM

## 2023-05-29 DIAGNOSIS — N179 Acute kidney failure, unspecified: Secondary | ICD-10-CM | POA: Diagnosis not present

## 2023-05-29 DIAGNOSIS — R600 Localized edema: Secondary | ICD-10-CM | POA: Diagnosis not present

## 2023-05-29 DIAGNOSIS — I509 Heart failure, unspecified: Secondary | ICD-10-CM | POA: Diagnosis not present

## 2023-05-29 DIAGNOSIS — I10 Essential (primary) hypertension: Secondary | ICD-10-CM | POA: Diagnosis not present

## 2023-05-29 DIAGNOSIS — I5082 Biventricular heart failure: Secondary | ICD-10-CM

## 2023-05-29 DIAGNOSIS — I5021 Acute systolic (congestive) heart failure: Secondary | ICD-10-CM | POA: Diagnosis not present

## 2023-05-29 DIAGNOSIS — I255 Ischemic cardiomyopathy: Secondary | ICD-10-CM | POA: Diagnosis not present

## 2023-05-29 DIAGNOSIS — I513 Intracardiac thrombosis, not elsewhere classified: Secondary | ICD-10-CM

## 2023-05-29 DIAGNOSIS — I776 Arteritis, unspecified: Secondary | ICD-10-CM | POA: Diagnosis not present

## 2023-05-29 DIAGNOSIS — E1169 Type 2 diabetes mellitus with other specified complication: Secondary | ICD-10-CM | POA: Diagnosis not present

## 2023-05-29 DIAGNOSIS — E871 Hypo-osmolality and hyponatremia: Secondary | ICD-10-CM | POA: Diagnosis not present

## 2023-05-29 LAB — BASIC METABOLIC PANEL
Anion gap: 15 (ref 5–15)
BUN: 24 mg/dL — ABNORMAL HIGH (ref 6–20)
CO2: 24 mmol/L (ref 22–32)
Calcium: 8.4 mg/dL — ABNORMAL LOW (ref 8.9–10.3)
Chloride: 94 mmol/L — ABNORMAL LOW (ref 98–111)
Creatinine, Ser: 1.29 mg/dL — ABNORMAL HIGH (ref 0.61–1.24)
GFR, Estimated: >60 mL/min (ref >60)
Glucose, Bld: 131 mg/dL — ABNORMAL HIGH (ref 70–99)
Potassium: 3.2 mmol/L — ABNORMAL LOW (ref 3.5–5.1)
Sodium: 133 mmol/L — ABNORMAL LOW (ref 135–145)

## 2023-05-29 LAB — GLUCOSE, CAPILLARY
Glucose-Capillary: 198 mg/dL — ABNORMAL HIGH (ref 70–99)
Glucose-Capillary: 180 mg/dL — ABNORMAL HIGH (ref 70–99)
Glucose-Capillary: 168 mg/dL — ABNORMAL HIGH (ref 70–99)
Glucose-Capillary: 175 mg/dL — ABNORMAL HIGH (ref 70–99)

## 2023-05-29 LAB — MAGNESIUM: Magnesium: 2.2 mg/dL (ref 1.7–2.4)

## 2023-05-29 LAB — COOXEMETRY PANEL
Carboxyhemoglobin: 1.7 % — ABNORMAL HIGH (ref 0.5–1.5)
Methemoglobin: 1 % (ref 0.0–1.5)
O2 Saturation: 62.2 %
Total hemoglobin: 15.2 g/dL (ref 12.0–16.0)

## 2023-05-29 MED ORDER — CHLORHEXIDINE GLUCONATE CLOTH 2 % EX PADS
6.0000 | MEDICATED_PAD | Freq: Every day | CUTANEOUS | Status: DC
  Administered 2023-05-29 – 2023-06-02 (×5): 6 via TOPICAL

## 2023-05-29 MED ORDER — POTASSIUM CHLORIDE CRYS ER 20 MEQ PO TBCR
40.0000 meq | EXTENDED_RELEASE_TABLET | Freq: Two times a day (BID) | ORAL | Status: DC
  Administered 2023-05-30 – 2023-05-31 (×4): 40 meq via ORAL
  Filled 2023-05-29 (×4): qty 2

## 2023-05-29 MED ORDER — SODIUM CHLORIDE 0.9% FLUSH
10.0000 mL | Freq: Two times a day (BID) | INTRAVENOUS | Status: DC
  Administered 2023-05-29 (×2): 10 mL
  Administered 2023-05-30: 20 mL
  Administered 2023-05-30: 10 mL
  Administered 2023-05-31: 20 mL
  Administered 2023-05-31 – 2023-06-02 (×4): 10 mL

## 2023-05-29 MED ORDER — POTASSIUM CHLORIDE CRYS ER 20 MEQ PO TBCR
60.0000 meq | EXTENDED_RELEASE_TABLET | Freq: Once | ORAL | Status: AC
  Administered 2023-05-29: 60 meq via ORAL
  Filled 2023-05-29: qty 3

## 2023-05-29 MED ORDER — SODIUM CHLORIDE 0.9% FLUSH: 10.0000 mL | INTRAVENOUS | Status: DC | PRN

## 2023-05-29 MED ORDER — POTASSIUM CHLORIDE CRYS ER 20 MEQ PO TBCR: 60.0000 meq | EXTENDED_RELEASE_TABLET | Freq: Once | ORAL | Status: DC

## 2023-05-29 MED ORDER — ATORVASTATIN CALCIUM 80 MG PO TABS
80.0000 mg | ORAL_TABLET | Freq: Every day | ORAL | Status: DC
  Administered 2023-05-29 – 2023-06-03 (×6): 80 mg via ORAL
  Filled 2023-05-29 (×6): qty 1

## 2023-05-29 NOTE — Progress Notes (Signed)
 Heart Failure Navigator Progress Note  Assessed for Heart & Vascular TOC clinic readiness.  Patient does not meet criteria due to Advanced Heart Failure Team was consulted. Patient original HF [facility] appointment on 06/08/2023 was cancelled, will follow up in the AHF clinic. .   Navigator will sign off at this time.   Stephane Haddock, BSN, Scientist, Clinical (histocompatibility And Immunogenetics) Only

## 2023-05-29 NOTE — TOC Progression Note (Signed)
 Transition of Care [facility]) - Progression Note    Patient Details  Name: Evan [name] MRN: [id number] Date of Birth: Jan 01, 1966  [facility]) CM/SW Contact  Waddell Barnie Rama, RN Phone Number: 05/29/2023, 2:24 PM  Clinical Narrative:    NCM offered choice to patient for [facility] for unaboots, he is ok with [facility], NCM made referral to [facility] , she is able to take referral.  Soc will begin 24 to 48 hrs post dc.   Expected Discharge Plan: Home w Home Health Services Barriers to Discharge: Continued Medical Work up  Expected Discharge Plan and Services In-house Referral: NA Discharge Planning Services: CM Consult Post Acute Care Choice: NA Living arrangements for the past 2 months: Single Family Home                   DME Agency: NA       HH Arranged: RN HH Agency: [facility] Date HH Agency Contacted: 05/29/23 Time HH Agency Contacted: 1423 Representative spoke with at [facility] Agency: [place]   Social Determinants of Health (SDOH) Interventions SDOH Screenings   Food Insecurity: No Food Insecurity (05/28/2023)  Housing: Low Risk  (05/28/2023)  Transportation Needs: No Transportation Needs (05/28/2023)  Utilities: Not At Risk (05/28/2023)  Alcohol Screen: Low Risk  (05/28/2023)  Depression (PHQ2-9): Medium Risk (05/21/2023)  Financial Resource Strain: Low Risk  (05/28/2023)  Tobacco Use: Low Risk  (05/26/2023)    Readmission Risk Interventions    05/07/2023   11:32 AM  Readmission Risk Prevention Plan  Transportation Screening Complete  PCP or Specialist Appt within 5-7 Days Complete  Home Care Screening Complete  Medication Review (RN CM) Complete

## 2023-05-29 NOTE — Progress Notes (Signed)
 Advanced Heart Failure Rounding Note  Cardiologist: Dorn Lesches, MD  Chief Complaint: Acute/Chronic HFrEf Subjective:   Yesterday milrinone  ordered but not started. Diuresed IV lasix .   Frustrated about his breakfast.    Objective:   Weight Range: 124.7 kg Body mass index is 32.61 kg/m.   Vital Signs:   Temp:  [97.1 F (36.2 C)-97.8 F (36.6 C)] 97.8 F (36.6 C) (01/10 0438) Pulse Rate:  [86-92] 91 (01/09 1613) Resp:  [18-20] 18 (01/10 0103) BP: (88-106)/(62-80) 103/77 (01/10 0438) SpO2:  [96 %-100 %] 99 % (01/10 0438) Weight:  [124.7 kg] 124.7 kg (01/10 0438) Last BM Date : 05/28/23  Weight change: Filed Weights   05/27/23 1849 05/28/23 0556 05/29/23 0438  Weight: 124.1 kg 123.7 kg 124.7 kg    Intake/Output:   Intake/Output Summary (Last 24 hours) at 05/29/2023 0705 Last data filed at 05/29/2023 0600 Gross per 24 hour  Intake 960 ml  Output 925 ml  Net 35 ml      Physical Exam    General:  Sitting on the side of the bed. No resp difficulty HEENT: Normal Neck: Supple. JVP to jaw . Carotids 2+ bilat; no bruits. No lymphadenopathy or thyromegaly appreciated. Cor: PMI nondisplaced. Regular rate & rhythm. No rubs, gallops or murmurs. Lungs: Clear Abdomen: Soft, nontender, nondistended. No hepatosplenomegaly. No bruits or masses. Good bowel sounds. Extremities: No cyanosis, clubbing, rash, R and LLE with ace wraps  2 -3+ edema thighs. . Petechiae on extremities  Neuro: Alert & orientedx3, cranial nerves grossly intact. moves all 4 extremities w/o difficulty. Affect pleasant   Telemetry  SR 90s with occasional PVCs.    EKG    N/A  Labs    CBC Recent Labs    05/26/23 1740 05/27/23 1003  WBC 9.8 12.5*  NEUTROABS  --  9.8*  HGB 14.6 15.8  HCT 45.7 48.5  MCV 82.8 81.8  PLT 280 302   Basic Metabolic Panel Recent Labs    98/90/74 0258 05/28/23 2102 05/29/23 0255  NA 131* 132*  --   K 3.5 4.2  --   CL 97* 100  --   CO2 24 23  --    GLUCOSE 168* 153*  --   BUN 29* 25*  --   CREATININE 1.33* 1.28*  --   CALCIUM  8.1* 8.1*  --   MG  --  2.2 2.2   Liver Function Tests Recent Labs    05/27/23 1949 05/28/23 2102  AST 71* 54*  ALT 94* 82*  ALKPHOS 88 99  BILITOT 1.0 0.9  PROT 6.5 6.6  ALBUMIN 3.0* 3.1*   No results for input(s): LIPASE, AMYLASE in the last 72 hours. Cardiac Enzymes No results for input(s): CKTOTAL, CKMB, CKMBINDEX, TROPONINI in the last 72 hours.  BNP: BNP (last 3 results) Recent Labs    04/18/23 2030 05/05/23 0125 05/26/23 1740  BNP 1,039.0* 1,045.5* 1,354.6*    ProBNP (last 3 results) No results for input(s): PROBNP in the last 8760 hours.   D-Dimer No results for input(s): DDIMER in the last 72 hours. Hemoglobin A1C No results for input(s): HGBA1C in the last 72 hours. Fasting Lipid Panel No results for input(s): CHOL, HDL, LDLCALC, TRIG, CHOLHDL, LDLDIRECT in the last 72 hours. Thyroid  Function Tests No results for input(s): TSH, T4TOTAL, T3FREE, THYROIDAB in the last 72 hours.  Invalid input(s): FREET3  Other results:   Imaging    US  EKG SITE RITE Result Date: 05/28/2023 If Site Rite image not  attached, placement could not be confirmed due to current cardiac rhythm.    Medications:     Scheduled Medications:  apixaban   5 mg Oral BID   aspirin  EC  81 mg Oral Q breakfast   collagenase   1 Application Topical Daily   digoxin   0.125 mg Oral Daily   empagliflozin   25 mg Oral Daily   famotidine   20 mg Oral BID   fenofibrate   160 mg Oral Daily   fluticasone   2 spray Each Nare Daily   furosemide   80 mg Intravenous Q12H   insulin  aspart  0-9 Units Subcutaneous TID WC   midodrine   5 mg Oral TID WC   pantoprazole   40 mg Oral Daily   silver  sulfADIAZINE   1 Application Topical Daily   sodium chloride  flush  3 mL Intravenous Q12H    Infusions:  milrinone       PRN Medications: acetaminophen , fluticasone , ondansetron  **OR**  ondansetron  (ZOFRAN ) IV    Patient Profile   Evan [name] is a 58 y.o. 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   Assessment/Plan  Acute on chronic systolic heart failure, Mixed NICM/ICM -Patient has stage D biventricular failure with unfortunately no options for advanced therapies at this time due to recurrent drug use and poor compliance.  UDS positive for cocaine.  - Needs to start to milrinone  0.25 mcg/kg/min. GDMT limited due to hypotension.  - Continue midodrine  5 mg three times a day. -Continue Digoxin  125 mcg daily -Marked volume overload. Continue IV Lasix  80 mg twice daily  -Continue Jardiance  25 mg daily - Check BMET   2.  LV thrombus -Continue apixaban  5 mg twice daily   3.  NSVT -If NSVT burden increases start amiodarone infusion.   4.  Coronary artery disease -Status post PCI to the LAD and RCA in 2020 -Aspirin  81 mg daily - Add atorvastatin  80 mg daily   - No chest pain.   5.  Polysubstance abuse UDS + cocaine    High risk for readmit given poor insight.  Intermittently medications have been filled.   Length of Stay: 2  Evan Mosses, NP  05/29/2023, 7:05 AM  Advanced Heart Failure Team Pager [phone] (M-F; 7a - 5p)  Please contact [facility] for night-coverage after hours (5p -7a ) and weekends on [email or web address]

## 2023-05-29 NOTE — TOC Initial Note (Addendum)
 Transition of Care [facility]) - Initial/Assessment Note    Patient Details  Name: Evan [name] MRN: [id number] Date of Birth: 1966-03-24  Transition of Care [facility]) CM/SW Contact:    Arlana JINNY Nicholaus ISRAEL Phone Number: [phone] 05/29/2023, 10:14 AM  Clinical Narrative:   10:07 AM- HF CSW attempted to meet with pt at bedside. Pt was being seen by a nurse. CSW will follow up with pt at a more appropriate time.   HF CSW met with pt at bedside. Pt stated that he lives alone. Pt stated that he has no history of HH services. Pt stated that he was supposed to be getting  CPAP and never did due to hospitalization. Pt inquired if that was something we could assist with at hospital. Pt stated that he has a scale at home. CSW explained that a hospital follow up will be scheduled closer to dc.   TOC will continue following.                 Expected Discharge Plan: Home w Home Health Services Barriers to Discharge: Continued Medical Work up   Patient Goals and CMS Choice Patient states their goals for this hospitalization and ongoing recovery are:: return home   Choice offered to / list presented to : NA      Expected Discharge Plan and Services In-house Referral: NA Discharge Planning Services: CM Consult Post Acute Care Choice: NA Living arrangements for the past 2 months: Single Family Home                   DME Agency: NA       HH Arranged: NA          Prior Living Arrangements/Services Living arrangements for the past 2 months: Single Family Home Lives with:: Parents (mother) Patient language and need for interpreter reviewed:: Yes Do you feel safe going back to the place where you live?: Yes      Need for Family Participation in Patient Care: Yes (Comment) Care giver support system in place?: Yes (comment)   Criminal Activity/Legal Involvement Pertinent to Current Situation/Hospitalization: No - Comment as needed  Activities of Daily Living   ADL Screening (condition  at time of admission) Independently performs ADLs?: Yes (appropriate for developmental age) Is the patient deaf or have difficulty hearing?: No Does the patient have difficulty seeing, even when wearing glasses/contacts?: No Does the patient have difficulty concentrating, remembering, or making decisions?: No  Permission Sought/Granted Permission sought to share information with : Case Manager Permission granted to share information with : Yes, Verbal Permission Granted              Emotional Assessment Appearance:: Appears stated age Attitude/Demeanor/Rapport: Engaged Affect (typically observed): Appropriate Orientation: : Oriented to Self, Oriented to Place, Oriented to  Time, Oriented to Situation Alcohol / Substance Use: Alcohol Use Psych Involvement: No (comment)  Admission diagnosis:  Peripheral edema [R60.0] Acute HFrEF (heart failure with reduced ejection fraction) (HCC) [I50.21] Acute on chronic congestive heart failure, unspecified heart failure type (HCC) [I50.9] Patient Active Problem List   Diagnosis Date Noted   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) 05/28/2023   Lymphedema 05/28/2023   Nonsustained ventricular tachycardia (HCC) 05/28/2023   History of coronary artery stent placement 05/28/2023   Left ventricular thrombus 05/28/2023   Pulmonary hypertension, unspecified (HCC) 05/28/2023   OSA (obstructive sleep apnea) 05/28/2023   Cocaine use 05/28/2023   Atherosclerosis of native coronary artery  of native heart without angina pectoris 05/28/2023   Biventricular heart failure (HCC) 05/28/2023   Acute decompensated heart failure (HCC) 05/28/2023   Acute HFrEF (heart failure with reduced ejection fraction) (HCC) 05/27/2023   Diabetic foot ulcer (HCC) 05/05/2023   History of acute cholecystitis 04/18/23 treated conservatively 05/05/2023   Acute on chronic HFrEF (heart failure with reduced ejection  fraction) (HCC) 05/05/2023   Diabetic ulcer of lower leg (HCC) 05/05/2023   Cellulitis in diabetic foot (HCC) 05/05/2023   CHF (congestive heart failure) (HCC) 05/05/2023   Bilateral leg ulcer (HCC) 05/05/2023   Acute on chronic combined systolic and diastolic CHF (congestive heart failure) (HCC) 05/05/2023   Vasculitis (HCC) 05/05/2023   Abdominal pain 04/19/2023   Elevated brain natriuretic peptide (BNP) level 04/19/2023   Elevated troponin 04/19/2023   Elevated d-dimer 04/19/2023   Type 2 diabetes mellitus with hyperglycemia (HCC) 04/19/2023   Acute cholecystitis 04/18/2023   Hypertension 10/06/2022   Type 2 diabetes mellitus with complications (HCC) 02/15/2021   CAD S/P PCI 12/09/2018   Ischemic cardiomyopathy 12/09/2018   Left ventricular dysfunction    History of non-ST elevation myocardial infarction (NSTEMI) 11/24/2018   Obesity (BMI 30.0-34.9) 01/05/2018   GERD (gastroesophageal reflux disease) 07/08/2017   Mixed hyperlipidemia 12/22/2016   Essential hypertension 09/22/2016   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) 09/22/2016   Gout 09/22/2016   PCP:  Dettinger, Fonda LABOR, MD Pharmacy:   [facility] [address] [place] [address] Phone: [phone] Fax: [phone]  [facility] - [facility] [address] [place] [address] Phone: [phone] Fax: [phone]     Social Drivers of Health (SDOH) Social History: SDOH Screenings   Food Insecurity: No Food Insecurity (05/28/2023)  Housing: Low Risk  (05/28/2023)  Transportation Needs: No Transportation Needs (05/28/2023)  Utilities: Not At Risk (05/28/2023)  Alcohol Screen: Low Risk  (05/28/2023)  Depression (PHQ2-9): Medium Risk (05/21/2023)  Financial Resource Strain: Low Risk  (05/28/2023)  Tobacco Use: Low Risk  (05/26/2023)   SDOH Interventions: Housing Interventions: Intervention Not Indicated Transportation Interventions: Intervention Not  Indicated Alcohol Usage Interventions: Intervention Not Indicated (Score <7) Financial Strain Interventions: Intervention Not Indicated   Readmission Risk Interventions    05/07/2023   11:32 AM  Readmission Risk Prevention Plan  Transportation Screening Complete  PCP or Specialist Appt within 5-7 Days Complete  Home Care Screening Complete  Medication Review (RN CM) Complete

## 2023-05-29 NOTE — Progress Notes (Signed)
 Orthopedic Tech Progress Note Patient Details:  Evan [name] Jul 07, 1965 [id number]  Ortho Devices Type of Ortho Device: Nonie boot Ortho Device/Splint Location: Bilat Ortho Device/Splint Interventions: Ordered, Application, Adjustment   Post Interventions Patient Tolerated: Well  Evan [name] 05/29/2023, 6:24 PM

## 2023-05-29 NOTE — Progress Notes (Signed)
 [facility]                                                                               Evan [name], is a 58 y.o. male, DOB - [birth year], FMW:[id number] Admit date - 05/26/2023    Outpatient Primary MD for the patient is Dettinger, Fonda LABOR, MD  LOS - 2  days    Brief summary   Evan [name] is a 58 y.o. male with medical history significant of hypertension, dyslipidemia, type 2 DM,  , LV thrombus on Eliquis , PAF,CAD with anterior lateral MI in 2020,  S/P  intervention to the proximal LAD and staged RCA intervention, ischemic cardiomyopathy with LV EF Of 20%,with multiple hospitalizations for CHF now sent from HF office to ED for evaluation of acute CHF.     Assessment & Plan    Assessment and Plan:    Acute on chronic systolic heart failure./ Ischemic cardiomyopathy/  Last EF of 20% with associated global hypokinesis, and grade 3 diastolic dysfunction with echo May 05, 2023. Elevated BNP 1354.  Initially couldn't diurese him aggressively due to hypotension, HF team on board and started the patient on milrinone . BP improved and his lasix  was increased to 80 mg BID.  Strict intake and output , daily weights.  Diuresed about  4 lit with the initiation of Milrinone .  Continue with lasix , Jardiance , digoxin , midodrine ,.   Elevated troponins from demand ischemia secondary to decompensated heart failure.    Lv Thrombus  On Eliquis .   Paroxysmal Atrial fibrillation:  Rate controlled . On coreg  and eliquis .    Type 2 Diabetes mellitus:  Last A1c is 7.3 %.  CBG (last 3)  Recent Labs    05/29/23 0542 05/29/23 1041 05/29/23 1546  GLUCAP 198* 180* 168*     H/o CAD  S/P DES to proximal LAD and staged RCA.  Pt currently denies any chest pain.  Resume aspirin  81 mg daily and lipitor  80 mg daily.   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  Prurigo nodularis  As per discharge notes from [facility] he will need to mix triamcinolone  0.1%cream and silvadene  1% cream in 1:1 ratio and apply daily to the lower extremities .  Orthopedics on board..  On Unna boots.  Wound care consulted.   Polysubstance abuse:  Positive for cocaine.  TOC for resources.   Elevated liver enzymes  Liver congestion.    Hypokalemia Replaced.   Hyponatremia:  From fluid over load.  Slowly improving.    Mild AKI: suspect from hypoperfusion from CHF. Improving with diuresis.    Mild leukocytosis.  Recheck CBC in am.   Body mass index is 32.61 kg/m. Obesity  NSVT Improving.     Estimated body mass index is 32.61 kg/m as calculated from the following:   Height as of this encounter: 6' 5 (1.956 m).   Weight as of this encounter: 124.7 kg.  Code Status: full code.  DVT Prophylaxis:   apixaban  (ELIQUIS ) tablet 5 mg   Level of Care: Level of care: Telemetry Cardiac Family Communication: none at bedside.   Disposition Plan:     Remains inpatient appropriate:  IV lasix .  Procedures:  None.   Consultants:   HF team.   Antimicrobials:   Anti-infectives (From admission, onward)    None        Medications  Scheduled Meds:  apixaban   5 mg Oral BID   aspirin  EC  81 mg Oral Q breakfast   atorvastatin   80 mg Oral Daily   Chlorhexidine  Gluconate Cloth  6 each Topical Daily   collagenase   1 Application Topical Daily   digoxin   0.125 mg Oral Daily   empagliflozin   25 mg Oral Daily   famotidine   20 mg Oral BID   fenofibrate   160 mg Oral Daily   furosemide   80 mg Intravenous Q12H   insulin  aspart  0-9 Units Subcutaneous TID WC   midodrine   5 mg Oral TID WC   pantoprazole   40 mg Oral Daily   [START ON 05/30/2023] potassium chloride   40 mEq Oral BID   potassium chloride   60 mEq Oral Once   silver  sulfADIAZINE   1 Application Topical Daily   sodium chloride  flush  10-40 mL Intracatheter Q12H   sodium chloride  flush  3 mL Intravenous Q12H   Continuous Infusions:  milrinone  0.25 mcg/kg/min (05/29/23 1521)    PRN Meds:.acetaminophen , fluticasone , ondansetron  **OR** ondansetron  (ZOFRAN ) IV, sodium chloride  flush    Subjective:   Evan [name] was seen and examined today.   No new complaints today.   Objective:   Vitals:   05/29/23 0725 05/29/23 0812 05/29/23 0947 05/29/23 1552  BP: 97/71 107/87 119/88 92/73  Pulse: 94 96 98 93  Resp: 20 16 20 20   Temp: (!) 97.4 F (36.3 C)  (!) 97.3 F (36.3 C) 98.1 F (36.7 C)  TempSrc: Oral  Oral Oral  SpO2: 93% 100% 99% 99%  Weight:      Height:        Intake/Output Summary (Last 24 hours) at 05/29/2023 1616 Last data filed at 05/29/2023 1521 Gross per 24 hour  Intake 1023.14 ml  Output 5175 ml  Net -4151.86 ml   Filed Weights   05/27/23 1849 05/28/23 0556 05/29/23 0438  Weight: 124.1 kg 123.7 kg 124.7 kg     Exam General exam: Appears calm and comfortable  Respiratory system: Clear to auscultation. Respiratory effort normal. Cardiovascular system: S1 & S2 heard, RRR. Gastrointestinal system: Abdomen is nondistended, soft and nontender. Central nervous system: Alert and oriented. No focal neurological deficits. Extremities:  leg edema improving.  Skin:  multiple erythematous nodular lesions with ulcerations on the lower extremities.  Psychiatry: Mood & affect appropriate.    Data Reviewed:  I have personally reviewed following labs and imaging studies   CBC Lab Results  Component Value Date   WBC 12.5 (H) 05/27/2023   RBC 5.93 (H) 05/27/2023   HGB 15.8 05/27/2023   HCT 48.5 05/27/2023   MCV 81.8 05/27/2023   MCH 26.6 05/27/2023   PLT 302 05/27/2023   MCHC 32.6 05/27/2023   RDW 15.9 (H) 05/27/2023   LYMPHSABS 1.6 05/27/2023   MONOABS 0.9 05/27/2023   EOSABS 0.1 05/27/2023   BASOSABS 0.1 05/27/2023     Last metabolic panel Lab Results  Component Value Date   NA 133 (L) 05/29/2023   K 3.2 (L) 05/29/2023   CL 94 (L) 05/29/2023   CO2 24 05/29/2023   BUN 24 (H) 05/29/2023   CREATININE 1.29 (H) 05/29/2023    GLUCOSE 131 (H) 05/29/2023   GFRNONAA >60 05/29/2023   GFRAA 87 05/24/2020   CALCIUM  8.4 (L) 05/29/2023   PHOS  4.0 05/07/2023   PROT 6.6 05/28/2023   ALBUMIN 3.1 (L) 05/28/2023   LABGLOB 2.6 05/21/2023   AGRATIO 1.1 (L) 10/20/2022   BILITOT 0.9 05/28/2023   ALKPHOS 99 05/28/2023   AST 54 (H) 05/28/2023   ALT 82 (H) 05/28/2023   ANIONGAP 15 05/29/2023    CBG (last 3)  Recent Labs    05/29/23 0542 05/29/23 1041 05/29/23 1546  GLUCAP 198* 180* 168*      Coagulation Profile: No results for input(s): INR, PROTIME in the last 168 hours.   Radiology Studies: US  EKG SITE RITE Result Date: 05/28/2023 If Site Rite image not attached, placement could not be confirmed due to current cardiac rhythm.      Elgie Butter M.D. [facility] 05/29/2023, 4:16 PM  Available via Epic secure chat 7am-7pm After 7 pm, please refer to night coverage provider listed on amion.

## 2023-05-29 NOTE — Plan of Care (Signed)

## 2023-05-29 NOTE — Progress Notes (Addendum)
 Peripherally Inserted Central Catheter Placement  The IV Nurse has discussed with the patient and/or persons authorized to consent for the patient, the purpose of this procedure and the potential benefits and risks involved with this procedure.  The benefits include less needle sticks, lab draws from the catheter, and the patient may be discharged home with the catheter. Risks include, but not limited to, infection, bleeding, blood clot (thrombus formation), and puncture of an artery; nerve damage and irregular heartbeat and possibility to perform a PICC exchange if needed/ordered by physician.  Alternatives to this procedure were also discussed.  Bard Power PICC patient education guide, fact sheet on infection prevention and patient information card has been provided to patient /or left at bedside.    PICC Placement Documentation  PICC Double Lumen 05/29/23 Right Basilic 44 cm 0 cm (Active)  Indication for Insertion or Continuance of Line Vasoactive infusions 05/29/23 0923  Exposed Catheter (cm) 0 cm 05/29/23 0923  Site Assessment Clean, Dry, Intact 05/29/23 0923  Lumen #1 Status Flushed;Saline locked;Blood return noted 05/29/23 0923  Lumen #2 Status Flushed;Saline locked;Blood return noted 05/29/23 0923  Dressing Type Transparent;Securing device 05/29/23 9076  Dressing Status Antimicrobial disc/dressing in place;Clean, Dry, Intact 05/29/23 9076  Line Care Connections checked and tightened 05/29/23 9076  Line Adjustment (NICU/IV Team Only) No 05/29/23 0923  Dressing Intervention New dressing;Adhesive placed at insertion site (IV team only);Other (Comment) 05/29/23 0923  Dressing Change Due 06/05/23 05/29/23 0923    UKG site rite result printed and place on chart.   Jolee Na 05/29/2023, 9:24 AM

## 2023-05-29 NOTE — Consult Note (Signed)
 WOC consulted for LE, please see notes from [facility] 05/27/23 and from Dr. Harden 05/27/23 at which time topical care and Unna's boots were ordered. Updated topical care again and notified staff of Unna's boots orders from 05/27/23.   Unna's boots changed 2x week. Topical care cleanse with Vashe, pat dry. Apply Santyl  and foam 2x wk with Unna's boot changes.   Will need HHRN for Unna's boot changes and topical wound care 2x wk and FU with Dr. Harden outpatient.    Discussed POC with nursing, TOC, and hospitalist  Re consult if needed, will not follow at this time. Thanks  Susy Placzek [employer], RN,CWOCN, CNS, CWON-AP [phone])

## 2023-05-30 DIAGNOSIS — I5021 Acute systolic (congestive) heart failure: Secondary | ICD-10-CM | POA: Diagnosis not present

## 2023-05-30 LAB — BASIC METABOLIC PANEL
Anion gap: 10 (ref 5–15)
BUN: 22 mg/dL — ABNORMAL HIGH (ref 6–20)
CO2: 27 mmol/L (ref 22–32)
Calcium: 8.1 mg/dL — ABNORMAL LOW (ref 8.9–10.3)
Chloride: 94 mmol/L — ABNORMAL LOW (ref 98–111)
Creatinine, Ser: 1.32 mg/dL — ABNORMAL HIGH (ref 0.61–1.24)
GFR, Estimated: >60 mL/min (ref >60)
Glucose, Bld: 187 mg/dL — ABNORMAL HIGH (ref 70–99)
Potassium: 3.1 mmol/L — ABNORMAL LOW (ref 3.5–5.1)
Sodium: 131 mmol/L — ABNORMAL LOW (ref 135–145)

## 2023-05-30 LAB — CBC WITH DIFFERENTIAL/PLATELET
Abs Immature Granulocytes: 0.04 10*3/uL (ref 0.00–0.07)
Basophils Absolute: 0.1 10*3/uL (ref 0.0–0.1)
Basophils Relative: 1 %
Eosinophils Absolute: 0.1 10*3/uL (ref 0.0–0.5)
Eosinophils Relative: 1 %
HCT: 42.7 % (ref 39.0–52.0)
Hemoglobin: 14.1 g/dL (ref 13.0–17.0)
Immature Granulocytes: 0 %
Lymphocytes Relative: 10 %
Lymphs Abs: 1.1 10*3/uL (ref 0.7–4.0)
MCH: 26.3 pg (ref 26.0–34.0)
MCHC: 33 g/dL (ref 30.0–36.0)
MCV: 79.5 fL — ABNORMAL LOW (ref 80.0–100.0)
Monocytes Absolute: 1.1 10*3/uL — ABNORMAL HIGH (ref 0.1–1.0)
Monocytes Relative: 9 %
Neutro Abs: 9.3 10*3/uL — ABNORMAL HIGH (ref 1.7–7.7)
Neutrophils Relative %: 79 %
Platelets: 255 10*3/uL (ref 150–400)
RBC: 5.37 MIL/uL (ref 4.22–5.81)
RDW: 15.6 % — ABNORMAL HIGH (ref 11.5–15.5)
WBC: 11.7 10*3/uL — ABNORMAL HIGH (ref 4.0–10.5)
nRBC: 0 % (ref 0.0–0.2)

## 2023-05-30 LAB — GLUCOSE, CAPILLARY
Glucose-Capillary: 145 mg/dL — ABNORMAL HIGH (ref 70–99)
Glucose-Capillary: 185 mg/dL — ABNORMAL HIGH (ref 70–99)
Glucose-Capillary: 136 mg/dL — ABNORMAL HIGH (ref 70–99)
Glucose-Capillary: 203 mg/dL — ABNORMAL HIGH (ref 70–99)

## 2023-05-30 LAB — COOXEMETRY PANEL
Carboxyhemoglobin: 2 % — ABNORMAL HIGH (ref 0.5–1.5)
Methemoglobin: <0.7 % (ref 0.0–1.5)
O2 Saturation: 76.3 %
Total hemoglobin: 13.9 g/dL (ref 12.0–16.0)

## 2023-05-30 LAB — MAGNESIUM: Magnesium: 2 mg/dL (ref 1.7–2.4)

## 2023-05-30 MED ORDER — ORAL CARE MOUTH RINSE: 15.0000 mL | OROMUCOSAL | Status: DC | PRN

## 2023-05-30 MED ORDER — POTASSIUM CHLORIDE 20 MEQ PO PACK
40.0000 meq | PACK | Freq: Once | ORAL | Status: AC
  Administered 2023-05-30: 40 meq via ORAL
  Filled 2023-05-30: qty 2

## 2023-05-30 MED ORDER — POTASSIUM CHLORIDE CRYS ER 20 MEQ PO TBCR
40.0000 meq | EXTENDED_RELEASE_TABLET | Freq: Once | ORAL | Status: AC
  Administered 2023-05-30: 40 meq via ORAL
  Filled 2023-05-30: qty 2

## 2023-05-30 NOTE — Progress Notes (Signed)
 Progress Note  Patient Name: Evan [name] Date of Encounter: 05/30/2023  Primary Cardiologist: Dorn Lesches, MD   Subjective   Patient seen and examined at bedside. He was sleeping when I arrive but offer no complaints at this time.  Inpatient Medications    Scheduled Meds:  apixaban   5 mg Oral BID   aspirin  EC  81 mg Oral Q breakfast   atorvastatin   80 mg Oral Daily   Chlorhexidine  Gluconate Cloth  6 each Topical Daily   collagenase   1 Application Topical Daily   digoxin   0.125 mg Oral Daily   empagliflozin   25 mg Oral Daily   famotidine   20 mg Oral BID   fenofibrate   160 mg Oral Daily   furosemide   80 mg Intravenous Q12H   insulin  aspart  0-9 Units Subcutaneous TID WC   midodrine   5 mg Oral TID WC   pantoprazole   40 mg Oral Daily   potassium chloride   40 mEq Oral BID   silver  sulfADIAZINE   1 Application Topical Daily   sodium chloride  flush  10-40 mL Intracatheter Q12H   sodium chloride  flush  3 mL Intravenous Q12H   Continuous Infusions:  milrinone  0.25 mcg/kg/min (05/29/23 2326)   PRN Meds: acetaminophen , fluticasone , ondansetron  **OR** ondansetron  (ZOFRAN ) IV, sodium chloride  flush   Vital Signs    Vitals:   05/30/23 0149 05/30/23 0408 05/30/23 0648 05/30/23 0732  BP: 115/84 110/75  111/66  Pulse: 96   96  Resp: 20   20  Temp: (!) 97.5 F (36.4 C) 98 F (36.7 C)  98.1 F (36.7 C)  TempSrc: Axillary Oral  Oral  SpO2: 99% 99%    Weight:   114.3 kg   Height:        Intake/Output Summary (Last 24 hours) at 05/30/2023 1035 Last data filed at 05/30/2023 0645 Gross per 24 hour  Intake 896.14 ml  Output 8450 ml  Net -7553.86 ml   Filed Weights   05/28/23 0556 05/29/23 0438 05/30/23 0648  Weight: 123.7 kg 124.7 kg 114.3 kg    Telemetry     - Personally Reviewed  ECG     - Personally Reviewed  Physical Exam    General: Comfortable Head: Atraumatic, normal size  Eyes: PEERLA, EOMI  Neck: Supple, normal JVD Cardiac: Normal S1, S2; RRR; no  murmurs, rubs, or gallops Lungs: Clear to auscultation bilaterally Abd: Soft, nontender, no hepatomegaly  Ext: warm, no edema Musculoskeletal: No deformities, BUE and BLE strength normal and equal Skin: Warm and dry, no rashes   Neuro: Alert and oriented to person, place, time, and situation, CNII-XII grossly intact, no focal deficits  Psych: Normal mood and affect   Labs    Chemistry Recent Labs  Lab 05/27/23 1949 05/28/23 0258 05/28/23 2102 05/29/23 1217 05/30/23 0415  NA 130*   < > 132* 133* 131*  K 4.5   < > 4.2 3.2* 3.1*  CL 97*   < > 100 94* 94*  CO2 23   < > 23 24 27   GLUCOSE 210*   < > 153* 131* 187*  BUN 34*   < > 25* 24* 22*  CREATININE 1.49*   < > 1.28* 1.29* 1.32*  CALCIUM  8.5*   < > 8.1* 8.4* 8.1*  PROT 6.5  --  6.6  --   --   ALBUMIN 3.0*  --  3.1*  --   --   AST 71*  --  54*  --   --  ALT 94*  --  82*  --   --   ALKPHOS 88  --  99  --   --   BILITOT 1.0  --  0.9  --   --   GFRNONAA 54*   < > >60 >60 >60  ANIONGAP 10   < > 9 15 10    < > = values in this interval not displayed.     Hematology Recent Labs  Lab 05/26/23 1740 05/27/23 1003 05/30/23 0415  WBC 9.8 12.5* 11.7*  RBC 5.52 5.93* 5.37  HGB 14.6 15.8 14.1  HCT 45.7 48.5 42.7  MCV 82.8 81.8 79.5*  MCH 26.4 26.6 26.3  MCHC 31.9 32.6 33.0  RDW 15.6* 15.9* 15.6*  PLT 280 302 255    Cardiac EnzymesNo results for input(s): TROPONINI in the last 168 hours. No results for input(s): TROPIPOC in the last 168 hours.   BNP Recent Labs  Lab 05/26/23 1740  BNP 1,354.6*     DDimer No results for input(s): DDIMER in the last 168 hours.   Radiology    US  EKG SITE RITE Result Date: 05/28/2023 If Site Rite image not attached, placement could not be confirmed due to current cardiac rhythm.   Cardiac Studies     Patient Profile     58 y.o. 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   Assessment & Plan    Acute on chronic systolic heart failure/stage D biventricular heart failure Recurrent cocaine abuse Left ventricular thrombus NSVT Coronary artery disease  Clinically still appears to be volume overloaded.  He seems to be responding to the diuretics he has had a negative balance today.  Will continue the milrinone , along with the midodrine . No changes will be made to his medical regimen today, will keep him on the digoxin , Jardiance  In terms of his LV thrombus keep the patient on his apixaban  5 mg daily. Unfortunately he is not a candidate for advanced therapies for heart failure. He also does have significant poor insight, and his recurrent use of cocaine has not helped with his medical complaints.      For questions or updates, please contact [facility] Please consult [email or web address] for contact info under Cardiology/STEMI.      Signed, Shyne Resch, DO  05/30/2023, 10:35 AM

## 2023-05-30 NOTE — Progress Notes (Signed)
 PROGRESS NOTE    Evan [name]  FMW:[id number] DOB: [birth year] DOA: 05/26/2023 PCP: Dettinger, Fonda LABOR, MD   Brief Narrative:  This 58 yrs old male with PMH significant for hypertension, dyslipidemia, type 2 DM,  , LV thrombus on Eliquis , PAF, CAD with anterior lateral MI in 2020,  S/P  intervention to the proximal LAD and staged RCA intervention, ischemic cardiomyopathy with LV EF Of 20%,with multiple hospitalizations for CHF now sent from HF office to ED for evaluation of acute CHF.   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 Elevated BNP 1354.  Initially couldn't diurese him aggressively due to hypotension,  HF team on board and started  patient on milrinone .  BP improved and his lasix  was increased to 80 mg BID.  Strict intake and output , daily weights.  Diuresed about 7 lit with the initiation of Milrinone .  Continue with lasix , Jardiance , digoxin , midodrine .   Intake/Output Summary (Last 24 hours) at 05/30/2023 1144 Last data filed at 05/30/2023 0645 Gross per 24 hour  Intake 896.14 ml   Output 7500 ml  Net -6603.86 ml    Elevated troponins : Likely from demand ischemia secondary to decompensated heart failure.    LV Thrombus: Continue  Eliquis .    Paroxysmal Atrial fibrillation:  HR well controlled .  Continue coreg  and eliquis .      Type 2 Diabetes mellitus:  Last A1c is 7.3 %. Carb modified diet. Continue sliding scale.      H/o CAD  S/P DES to proximal LAD and staged RCA: Pt currently denies any chest pain, sob, palpitations.  Continue  aspirin  81 mg daily and lipitor  80 mg daily.    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  Orthopedics on board..  On Unna boots.  Wound care consulted.    Polysubstance abuse:  Positive for cocaine.  TOC for resources.    Elevated liver enzymes : Liver congestion.  Trend live enzymes.   Hypokalemia: Replaced. Continue to monitor.   Hyponatremia:  From fluid over load.  Slowly improving.    Mild AKI: suspect from hypoperfusion from CHF. Improving with diuresis.    Mild leukocytosis: Recheck CBC in am.    NSVT Improving.     Body mass index is 32.61 kg/m. Obesity: Diet and exercise discussed in detail. Estimated body mass index is 32.61 kg/m as calculated from the following:   Height as of this encounter: 6' 5 (1.956 m).  Weight as of this encounter: 124.7 kg.   DVT prophylaxis: Eliquis  Code Status:Full code Family Communication:No family at bed side. Disposition Plan:     Status is: Inpatient Remains inpatient appropriate because: Admitted for acute systolic CHF, remains on milrinone  infusion..   Consultants:  Cardiology  Procedures: None  Antimicrobials: None   Subjective: Patient was seen and examined at bedside.  Overnight events noted.   Patient was sitting comfortably with una boots  on lower extremities.   He reports feeling better,  remains on milrinone  infusion.  Objective: Vitals:   05/30/23 0149 05/30/23 0408 05/30/23 0648 05/30/23 0732  BP: 115/84 110/75  111/66  Pulse: 96   96  Resp: 20   20  Temp: (!) 97.5 F (36.4 C) 98 F (36.7 C)  98.1 F (36.7 C)  TempSrc: Axillary Oral  Oral  SpO2: 99% 99%    Weight:   114.3 kg   Height:        Intake/Output Summary (Last 24 hours) at 05/30/2023 1144 Last data filed at 05/30/2023 0645 Gross per 24 hour  Intake 896.14 ml  Output 7500 ml  Net -6603.86 ml   Filed Weights   05/28/23 0556 05/29/23 0438 05/30/23 0648  Weight: 123.7 kg 124.7 kg 114.3 kg    Examination:  General exam: Appears calm and comfortable, deconditioned, not in any acute distress. Respiratory system: Clear to auscultation. Respiratory effort normal.  RR 15 Cardiovascular system: S1 & S2 heard, RRR. No JVD, murmurs, rubs, gallops or clicks.  Gastrointestinal system: Abdomen is non distended, soft and non tender.  Normal bowel sounds heard. Central nervous system: Alert and oriented x 3. No focal neurological deficits. Extremities: Both lower extremities swollen : Covered in Unna boots because of cellulitis. Skin: No rashes, lesions or ulcers Psychiatry: Judgement and insight appear normal. Mood & affect appropriate.     Data Reviewed: I have personally reviewed following labs and imaging studies  CBC: Recent Labs  Lab 05/26/23 1740 05/27/23 1003 05/30/23 0415  WBC 9.8 12.5* 11.7*  NEUTROABS  --  9.8* 9.3*  HGB 14.6 15.8 14.1  HCT 45.7 48.5 42.7  MCV 82.8 81.8 79.5*  PLT 280 302 255   Basic Metabolic Panel: Recent Labs  Lab 05/27/23 1949 05/28/23 0258 05/28/23 2102 05/29/23 0255 05/29/23 1217 05/30/23 0415  NA 130* 131* 132*  --  133* 131*  K 4.5 3.5 4.2  --  3.2* 3.1*  CL 97* 97* 100  --  94* 94*  CO2 23 24 23   --  24 27  GLUCOSE 210* 168* 153*  --  131* 187*  BUN 34* 29* 25*  --  24* 22*  CREATININE 1.49* 1.33*  1.28*  --  1.29* 1.32*  CALCIUM  8.5* 8.1* 8.1*  --  8.4* 8.1*  MG 2.2  --  2.2 2.2  --  2.0   GFR: Estimated Creatinine Clearance: 86.6 mL/min (A) (by C-G formula based on SCr of 1.32 mg/dL (H)). Liver Function Tests: Recent Labs  Lab 05/27/23 1949 05/28/23 2102  AST 71* 54*  ALT 94* 82*  ALKPHOS 88 99  BILITOT 1.0 0.9  PROT 6.5 6.6  ALBUMIN 3.0* 3.1*   No results for input(s): LIPASE, AMYLASE in the last 168 hours. No results for input(s): AMMONIA in the last 168 hours. Coagulation Profile: No results for input(s): INR, PROTIME in the last 168 hours. Cardiac Enzymes: No results for input(s): CKTOTAL, CKMB, CKMBINDEX, TROPONINI in the last 168 hours. BNP (last 3 results) No results  for input(s): PROBNP in the last 8760 hours. HbA1C: No results for input(s): HGBA1C in the last 72 hours. CBG: Recent Labs  Lab 05/29/23 0542 05/29/23 1041 05/29/23 1546 05/29/23 2107 05/30/23 0616  GLUCAP 198* 180* 168* 175* 145*   Lipid Profile: No results for input(s): CHOL, HDL, LDLCALC, TRIG, CHOLHDL, LDLDIRECT in the last 72 hours. Thyroid  Function Tests: No results for input(s): TSH, T4TOTAL, FREET4, T3FREE, THYROIDAB in the last 72 hours. Anemia Panel: No results for input(s): VITAMINB12, FOLATE, FERRITIN, TIBC, IRON, RETICCTPCT in the last 72 hours. Sepsis Labs: Recent Labs  Lab 05/28/23 2102  LATICACIDVEN 1.5    No results found for this or any previous visit (from the past 240 hours).   Radiology Studies: US  EKG SITE RITE Result Date: 05/28/2023 If Site Rite image not attached, placement could not be confirmed due to current cardiac rhythm.  Scheduled Meds:  apixaban   5 mg Oral BID   aspirin  EC  81 mg Oral Q breakfast   atorvastatin   80 mg Oral Daily   Chlorhexidine  Gluconate Cloth  6 each Topical Daily   collagenase   1 Application Topical Daily   digoxin   0.125 mg Oral Daily   empagliflozin   25 mg Oral Daily    famotidine   20 mg Oral BID   fenofibrate   160 mg Oral Daily   furosemide   80 mg Intravenous Q12H   insulin  aspart  0-9 Units Subcutaneous TID WC   midodrine   5 mg Oral TID WC   pantoprazole   40 mg Oral Daily   potassium chloride   40 mEq Oral BID   silver  sulfADIAZINE   1 Application Topical Daily   sodium chloride  flush  10-40 mL Intracatheter Q12H   sodium chloride  flush  3 mL Intravenous Q12H   Continuous Infusions:  milrinone  0.25 mcg/kg/min (05/30/23 1134)     LOS: 3 days    Time spent: 50 mins    Darcel Dawley, MD [facility]   If 7PM-7AM, please contact night-coverage

## 2023-05-30 NOTE — Plan of Care (Signed)

## 2023-05-31 DIAGNOSIS — I5021 Acute systolic (congestive) heart failure: Secondary | ICD-10-CM | POA: Diagnosis not present

## 2023-05-31 LAB — CBC
HCT: 44.8 % (ref 39.0–52.0)
Hemoglobin: 14.7 g/dL (ref 13.0–17.0)
MCH: 26.2 pg (ref 26.0–34.0)
MCHC: 32.8 g/dL (ref 30.0–36.0)
MCV: 79.7 fL — ABNORMAL LOW (ref 80.0–100.0)
Platelets: 244 10*3/uL (ref 150–400)
RBC: 5.62 MIL/uL (ref 4.22–5.81)
RDW: 15.5 % (ref 11.5–15.5)
WBC: 10.3 10*3/uL (ref 4.0–10.5)
nRBC: 0 % (ref 0.0–0.2)

## 2023-05-31 LAB — BASIC METABOLIC PANEL
Anion gap: 10 (ref 5–15)
BUN: 17 mg/dL (ref 6–20)
CO2: 32 mmol/L (ref 22–32)
Calcium: 8.6 mg/dL — ABNORMAL LOW (ref 8.9–10.3)
Chloride: 95 mmol/L — ABNORMAL LOW (ref 98–111)
Creatinine, Ser: 1.29 mg/dL — ABNORMAL HIGH (ref 0.61–1.24)
GFR, Estimated: >60 mL/min (ref >60)
Glucose, Bld: 146 mg/dL — ABNORMAL HIGH (ref 70–99)
Potassium: 3.4 mmol/L — ABNORMAL LOW (ref 3.5–5.1)
Sodium: 137 mmol/L (ref 135–145)

## 2023-05-31 LAB — COOXEMETRY PANEL
Carboxyhemoglobin: 2.2 % — ABNORMAL HIGH (ref 0.5–1.5)
Methemoglobin: <0.7 % (ref 0.0–1.5)
O2 Saturation: 75.6 %
Total hemoglobin: 14.6 g/dL (ref 12.0–16.0)

## 2023-05-31 LAB — PHOSPHORUS: Phosphorus: 3.7 mg/dL (ref 2.5–4.6)

## 2023-05-31 LAB — GLUCOSE, CAPILLARY
Glucose-Capillary: 178 mg/dL — ABNORMAL HIGH (ref 70–99)
Glucose-Capillary: 172 mg/dL — ABNORMAL HIGH (ref 70–99)
Glucose-Capillary: 147 mg/dL — ABNORMAL HIGH (ref 70–99)
Glucose-Capillary: 204 mg/dL — ABNORMAL HIGH (ref 70–99)

## 2023-05-31 LAB — MAGNESIUM: Magnesium: 2.2 mg/dL (ref 1.7–2.4)

## 2023-05-31 MED ORDER — POTASSIUM CHLORIDE 20 MEQ PO PACK
40.0000 meq | PACK | Freq: Once | ORAL | Status: AC
Start: 2023-05-31 — End: 2023-05-31
  Administered 2023-05-31: 40 meq via ORAL
  Filled 2023-05-31: qty 2

## 2023-05-31 NOTE — Progress Notes (Signed)
 Pt. With 10 beat and 30 beat run of VTach. Pt. Alert and stable in bed. No distress noted. Pt. Denies pain or discomfort. On call for [facility] paged to make aware.

## 2023-05-31 NOTE — Progress Notes (Signed)
 Progress Note  Patient Name: Evan [name] Date of Encounter: 05/31/2023  Primary Cardiologist: Dorn Lesches, MD   Subjective   Patient seen and examined at bedside.   Inpatient Medications    Scheduled Meds:  apixaban   5 mg Oral BID   aspirin  EC  81 mg Oral Q breakfast   atorvastatin   80 mg Oral Daily   Chlorhexidine  Gluconate Cloth  6 each Topical Daily   collagenase   1 Application Topical Daily   digoxin   0.125 mg Oral Daily   empagliflozin   25 mg Oral Daily   famotidine   20 mg Oral BID   fenofibrate   160 mg Oral Daily   furosemide   80 mg Intravenous Q12H   insulin  aspart  0-9 Units Subcutaneous TID WC   midodrine   5 mg Oral TID WC   pantoprazole   40 mg Oral Daily   potassium chloride   40 mEq Oral Once   potassium chloride   40 mEq Oral BID   silver  sulfADIAZINE   1 Application Topical Daily   sodium chloride  flush  10-40 mL Intracatheter Q12H   sodium chloride  flush  3 mL Intravenous Q12H   Continuous Infusions:  milrinone  0.25 mcg/kg/min (05/31/23 0919)   PRN Meds: acetaminophen , fluticasone , ondansetron  **OR** ondansetron  (ZOFRAN ) IV, mouth rinse, sodium chloride  flush   Vital Signs    Vitals:   05/31/23 0438 05/31/23 0455 05/31/23 0729 05/31/23 1035  BP: 115/87  102/67 95/69  Pulse:   98 86  Resp: 17  16 18   Temp: 98.3 F (36.8 C)  97.6 F (36.4 C) 97.6 F (36.4 C)  TempSrc: Oral  Oral Oral  SpO2: 98%  98% 97%  Weight:  108.5 kg    Height:        Intake/Output Summary (Last 24 hours) at 05/31/2023 1250 Last data filed at 05/31/2023 1246 Gross per 24 hour  Intake 1080 ml  Output 8075 ml  Net -6995 ml   Filed Weights   05/29/23 0438 05/30/23 0648 05/31/23 0455  Weight: 124.7 kg 114.3 kg 108.5 kg    Telemetry     - Personally Reviewed  ECG     - Personally Reviewed  Physical Exam    General: Comfortable Head: Atraumatic, normal size  Eyes: PEERLA, EOMI  Neck: Supple, normal JVD Cardiac: Normal S1, S2; RRR; no murmurs, rubs, or  gallops Lungs: Clear to auscultation bilaterally Abd: Soft, nontender, no hepatomegaly  Ext: warm, no edema Musculoskeletal: No deformities, BUE and BLE strength normal and equal Skin: Warm and dry, no rashes   Neuro: Alert and oriented to person, place, time, and situation, CNII-XII grossly intact, no focal deficits  Psych: Normal mood and affect   Labs    Chemistry Recent Labs  Lab 05/27/23 1949 05/28/23 0258 05/28/23 2102 05/29/23 1217 05/30/23 0415 05/31/23 0444  NA 130*   < > 132* 133* 131* 137  K 4.5   < > 4.2 3.2* 3.1* 3.4*  CL 97*   < > 100 94* 94* 95*  CO2 23   < > 23 24 27  32  GLUCOSE 210*   < > 153* 131* 187* 146*  BUN 34*   < > 25* 24* 22* 17  CREATININE 1.49*   < > 1.28* 1.29* 1.32* 1.29*  CALCIUM  8.5*   < > 8.1* 8.4* 8.1* 8.6*  PROT 6.5  --  6.6  --   --   --   ALBUMIN 3.0*  --  3.1*  --   --   --  AST 71*  --  54*  --   --   --   ALT 94*  --  82*  --   --   --   ALKPHOS 88  --  99  --   --   --   BILITOT 1.0  --  0.9  --   --   --   GFRNONAA 54*   < > >60 >60 >60 >60  ANIONGAP 10   < > 9 15 10 10    < > = values in this interval not displayed.     Hematology Recent Labs  Lab 05/27/23 1003 05/30/23 0415 05/31/23 0444  WBC 12.5* 11.7* 10.3  RBC 5.93* 5.37 5.62  HGB 15.8 14.1 14.7  HCT 48.5 42.7 44.8  MCV 81.8 79.5* 79.7*  MCH 26.6 26.3 26.2  MCHC 32.6 33.0 32.8  RDW 15.9* 15.6* 15.5  PLT 302 255 244    Cardiac EnzymesNo results for input(s): TROPONINI in the last 168 hours. No results for input(s): TROPIPOC in the last 168 hours.   BNP Recent Labs  Lab 05/26/23 1740  BNP 1,354.6*     DDimer No results for input(s): DDIMER in the last 168 hours.   Radiology    No results found.   Cardiac Studies     Patient Profile     59 y.o. 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   Assessment & Plan    Acute on chronic systolic heart failure/stage D biventricular heart failure Recurrent cocaine abuse Left ventricular thrombus NSVT Coronary artery disease  Clinically still appears to be volume overloaded.  He seems to be responding to the diuretics he has had a negative balance today.  Will continue the milrinone , along with the midodrine .  No changes will be made to his medical regimen today, will keep him on the digoxin , Jardiance   In terms of his LV thrombus keep the patient on his apixaban  5 mg daily. Unfortunately he is not a candidate for advanced therapies for heart failure. He also does have significant poor insight, and his recurrent use of cocaine has not helped with his medical complaints.    For questions or updates, please contact [facility] Please consult [email or web address] for contact info under Cardiology/STEMI.      Signed, Jeslyn Amsler, DO  05/31/2023, 12:50 PM

## 2023-05-31 NOTE — Plan of Care (Signed)
  Problem: Education: Goal: Knowledge of General Education information will improve Description: Including pain rating scale, medication(s)/side effects and non-pharmacologic comfort measures Outcome: Progressing   Problem: Activity: Goal: Risk for activity intolerance will decrease Outcome: Progressing   Problem: Elimination: Goal: Will not experience complications related to urinary retention Outcome: Progressing   Problem: Safety: Goal: Ability to remain free from injury will improve Outcome: Progressing

## 2023-05-31 NOTE — Plan of Care (Signed)

## 2023-05-31 NOTE — Progress Notes (Signed)
 PROGRESS NOTE    Evan [name]  FMW:[id number] DOB: [birth year] DOA: 05/26/2023 PCP: Dettinger, Fonda LABOR, MD   Brief Narrative:  This 58 yrs old male with PMH significant for hypertension, dyslipidemia, type 2 DM,  , LV thrombus on Eliquis , PAF, CAD with anterior lateral MI in 2020,  S/P  intervention to the proximal LAD and staged RCA intervention, ischemic cardiomyopathy with LV EF Of 20%,with multiple hospitalizations for CHF now sent from HF office to ED for evaluation of acute CHF.   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 Elevated BNP 1354.  Initially couldn't diurese him aggressively due to hypotension,  HF team on board and started  patient on milrinone .  BP improved and his lasix  was increased to 80 mg BID.  Strict intake and output , daily weights.  Diuresed about 7 lit with the initiation of Milrinone .  Continue with lasix , Jardiance , digoxin , midodrine .   Intake/Output Summary (Last 24 hours) at 05/31/2023 1155 Last data filed at 05/31/2023 1149 Gross per 24 hour  Intake 840 ml   Output 8075 ml  Net -7235 ml    Elevated troponins : Likely from demand ischemia secondary to decompensated heart failure.    LV Thrombus: Continue  Eliquis .    Paroxysmal Atrial fibrillation:  HR well controlled .  Continue coreg  and eliquis .      Type 2 Diabetes mellitus:  Last A1c is 7.3 %. Carb modified diet. Continue sliding scale.    H/o CAD  S/P DES to proximal LAD and staged RCA: Pt currently denies any chest pain, sob, palpitations.  Continue  aspirin  81 mg daily and lipitor  80 mg daily.    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  Prurigo nodularis As per discharge notes from [facility] he will need to mix triamcinolone 0.1%cream and silvadene  1% cream in 1:1 ratio and apply daily to the lower extremities .  Orthopedics on board..  On Unna boots.  Wound care consulted.    Polysubstance abuse:  Positive for cocaine.  TOC for resources.    Elevated liver enzymes : Liver congestion.  Trend liver enzymes.   Hypokalemia: Replaced. Continue to monitor.   Hyponatremia:  From fluid over load.  Slowly improving.    Mild AKI: suspect from hypoperfusion from CHF. Improving with diuresis.    Mild leukocytosis: Recheck CBC in am.    NSVT Improving.     Body mass index is 32.61 kg/m. Obesity: Diet and exercise discussed in detail. Estimated body mass index is 32.61 kg/m as calculated from the following:   Height as of this encounter: 6' 5 (1.956 m).   Weight as of  this encounter: 124.7 kg.   DVT prophylaxis: Eliquis  Code Status:Full code Family Communication:No family at bed side. Disposition Plan:     Status is: Inpatient Remains inpatient appropriate because: Admitted for acute systolic CHF, remains on milrinone  infusion..   Consultants:  Cardiology  Procedures: None  Antimicrobials: None   Subjective: Patient was seen and examined at bedside.  Overnight events noted.   Patient was sitting comfortably on edge of bed with  una boots on lower extremities.   He reports feeling better,  remains on milrinone  infusion.  Objective: Vitals:   05/31/23 0438 05/31/23 0455 05/31/23 0729 05/31/23 1035  BP: 115/87  102/67 95/69  Pulse:   98 86  Resp: 17  16 18   Temp: 98.3 F (36.8 C)  97.6 F (36.4 C) 97.6 F (36.4 C)  TempSrc: Oral  Oral Oral  SpO2: 98%  98% 97%  Weight:  108.5 kg    Height:        Intake/Output Summary (Last 24 hours) at 05/31/2023 1155 Last data filed at 05/31/2023 1149 Gross per 24 hour  Intake 840 ml  Output 8075 ml  Net -7235 ml   Filed Weights   05/29/23 0438 05/30/23 0648 05/31/23 0455  Weight: 124.7 kg 114.3 kg 108.5 kg    Examination:  General exam: Appears comfortable, deconditioned, not in any acute distress. Respiratory system: CTA bilaterally. Respiratory effort normal.  RR 15 Cardiovascular system: S1 & S2 heard, RRR. No JVD, murmurs, rubs, gallops or clicks.  Gastrointestinal system: Abdomen is non distended, soft and non tender.  Normal bowel sounds heard. Central nervous system: Alert and oriented x 3. No focal neurological deficits. Extremities: Both lower extremities swollen : Covered in Unna boots because of cellulitis. Skin: No rashes, lesions or ulcers Psychiatry: Judgement and insight appear normal. Mood & affect appropriate.     Data Reviewed: I have personally reviewed following labs and imaging studies  CBC: Recent Labs  Lab 05/26/23 1740 05/27/23 1003 05/30/23 0415 05/31/23 0444  WBC 9.8 12.5* 11.7* 10.3  NEUTROABS  --  9.8* 9.3*  --   HGB 14.6 15.8 14.1 14.7  HCT 45.7 48.5 42.7 44.8  MCV 82.8 81.8 79.5* 79.7*  PLT 280 302 255 244   Basic Metabolic Panel: Recent Labs  Lab 05/27/23 1949 05/28/23 0258 05/28/23 2102 05/29/23 0255 05/29/23 1217 05/30/23 0415 05/31/23 0444  NA 130* 131* 132*  --  133* 131* 137  K 4.5 3.5 4.2  --  3.2* 3.1* 3.4*  CL 97* 97* 100  --  94* 94* 95*  CO2 23 24 23   --  24 27 32  GLUCOSE 210* 168* 153*  --   131* 187* 146*  BUN 34* 29* 25*  --  24* 22* 17  CREATININE 1.49* 1.33* 1.28*  --  1.29* 1.32* 1.29*  CALCIUM  8.5* 8.1* 8.1*  --  8.4* 8.1* 8.6*  MG 2.2  --  2.2 2.2  --  2.0 2.2  PHOS  --   --   --   --   --   --  3.7   GFR: Estimated Creatinine Clearance: 86.6 mL/min (A) (by C-G formula based on SCr of 1.29 mg/dL (H)). Liver Function Tests: Recent Labs  Lab 05/27/23 1949 05/28/23 2102  AST 71* 54*  ALT 94* 82*  ALKPHOS 88 99  BILITOT 1.0 0.9  PROT 6.5 6.6  ALBUMIN 3.0* 3.1*   No results for input(s): LIPASE, AMYLASE in the last 168 hours. No results for input(s): AMMONIA  in the last 168 hours. Coagulation Profile: No results for input(s): INR, PROTIME in the last 168 hours. Cardiac Enzymes: No results for input(s): CKTOTAL, CKMB, CKMBINDEX, TROPONINI in the last 168 hours. BNP (last 3 results) No results for input(s): PROBNP in the last 8760 hours. HbA1C: No results for input(s): HGBA1C in the last 72 hours. CBG: Recent Labs  Lab 05/30/23 1144 05/30/23 1556 05/30/23 2003 05/31/23 0620 05/31/23 1041  GLUCAP 185* 136* 203* 178* 172*   Lipid Profile: No results for input(s): CHOL, HDL, LDLCALC, TRIG, CHOLHDL, LDLDIRECT in the last 72 hours. Thyroid  Function Tests: No results for input(s): TSH, T4TOTAL, FREET4, T3FREE, THYROIDAB in the last 72 hours. Anemia Panel: No results for input(s): VITAMINB12, FOLATE, FERRITIN, TIBC, IRON, RETICCTPCT in the last 72 hours. Sepsis Labs: Recent Labs  Lab 05/28/23 2102  LATICACIDVEN 1.5    No results found for this or any previous visit (from the past 240 hours).   Radiology Studies: No results found.  Scheduled Meds:  apixaban   5 mg Oral BID   aspirin  EC  81 mg Oral Q breakfast   atorvastatin   80 mg Oral Daily   Chlorhexidine  Gluconate Cloth  6 each Topical Daily   collagenase   1 Application Topical Daily   digoxin   0.125 mg Oral Daily   empagliflozin   25 mg  Oral Daily   famotidine   20 mg Oral BID   fenofibrate   160 mg Oral Daily   furosemide   80 mg Intravenous Q12H   insulin  aspart  0-9 Units Subcutaneous TID WC   midodrine   5 mg Oral TID WC   pantoprazole   40 mg Oral Daily   potassium chloride   40 mEq Oral Once   potassium chloride   40 mEq Oral BID   silver  sulfADIAZINE   1 Application Topical Daily   sodium chloride  flush  10-40 mL Intracatheter Q12H   sodium chloride  flush  3 mL Intravenous Q12H   Continuous Infusions:  milrinone  0.25 mcg/kg/min (05/31/23 0919)     LOS: 4 days    Time spent: 35 mins    Darcel Dawley, MD [facility]   If 7PM-7AM, please contact night-coverage

## 2023-06-01 DIAGNOSIS — I5023 Acute on chronic systolic (congestive) heart failure: Secondary | ICD-10-CM | POA: Diagnosis not present

## 2023-06-01 DIAGNOSIS — I5021 Acute systolic (congestive) heart failure: Secondary | ICD-10-CM | POA: Diagnosis not present

## 2023-06-01 LAB — CBC
HCT: 45 % (ref 39.0–52.0)
Hemoglobin: 14.6 g/dL (ref 13.0–17.0)
MCH: 26 pg (ref 26.0–34.0)
MCHC: 32.4 g/dL (ref 30.0–36.0)
MCV: 80.1 fL (ref 80.0–100.0)
Platelets: 254 10*3/uL (ref 150–400)
RBC: 5.62 MIL/uL (ref 4.22–5.81)
RDW: 15.7 % — ABNORMAL HIGH (ref 11.5–15.5)
WBC: 10 10*3/uL (ref 4.0–10.5)
nRBC: 0 % (ref 0.0–0.2)

## 2023-06-01 LAB — BASIC METABOLIC PANEL
Anion gap: 13 (ref 5–15)
BUN: 26 mg/dL — ABNORMAL HIGH (ref 6–20)
CO2: 28 mmol/L (ref 22–32)
Calcium: 8.4 mg/dL — ABNORMAL LOW (ref 8.9–10.3)
Chloride: 92 mmol/L — ABNORMAL LOW (ref 98–111)
Creatinine, Ser: 1.35 mg/dL — ABNORMAL HIGH (ref 0.61–1.24)
GFR, Estimated: >60 mL/min (ref >60)
Glucose, Bld: 192 mg/dL — ABNORMAL HIGH (ref 70–99)
Potassium: 3.3 mmol/L — ABNORMAL LOW (ref 3.5–5.1)
Sodium: 133 mmol/L — ABNORMAL LOW (ref 135–145)

## 2023-06-01 LAB — GLUCOSE, CAPILLARY
Glucose-Capillary: 120 mg/dL — ABNORMAL HIGH (ref 70–99)
Glucose-Capillary: 125 mg/dL — ABNORMAL HIGH (ref 70–99)
Glucose-Capillary: 103 mg/dL — ABNORMAL HIGH (ref 70–99)
Glucose-Capillary: 150 mg/dL — ABNORMAL HIGH (ref 70–99)

## 2023-06-01 LAB — PHOSPHORUS: Phosphorus: 3.8 mg/dL (ref 2.5–4.6)

## 2023-06-01 LAB — COOXEMETRY PANEL
Carboxyhemoglobin: 1.6 % — ABNORMAL HIGH (ref 0.5–1.5)
Methemoglobin: <0.7 % (ref 0.0–1.5)
O2 Saturation: 71.5 %
Total hemoglobin: 14.8 g/dL (ref 12.0–16.0)

## 2023-06-01 LAB — MAGNESIUM: Magnesium: 2 mg/dL (ref 1.7–2.4)

## 2023-06-01 MED ORDER — MILRINONE LACTATE IN DEXTROSE 20-5 MG/100ML-% IV SOLN
0.1250 ug/kg/min | INTRAVENOUS | Status: DC
  Administered 2023-06-01 – 2023-06-02 (×2): 0.125 ug/kg/min via INTRAVENOUS
  Filled 2023-06-01 (×2): qty 100

## 2023-06-01 MED ORDER — LOSARTAN POTASSIUM 25 MG PO TABS
12.5000 mg | ORAL_TABLET | Freq: Every day | ORAL | Status: DC
  Administered 2023-06-01: 12.5 mg via ORAL
  Filled 2023-06-01: qty 1

## 2023-06-01 MED ORDER — POTASSIUM CHLORIDE CRYS ER 20 MEQ PO TBCR
40.0000 meq | EXTENDED_RELEASE_TABLET | Freq: Four times a day (QID) | ORAL | Status: DC
  Administered 2023-06-01 – 2023-06-02 (×4): 40 meq via ORAL
  Filled 2023-06-01 (×4): qty 2

## 2023-06-01 NOTE — Progress Notes (Signed)
 PROGRESS NOTE    Evan [name]  FMW:[id number] DOB: 11/13/1965 DOA: 05/26/2023 PCP: Dettinger, Fonda LABOR, MD    Brief Narrative:  58 year old with history of hypertension, hyperlipidemia, type 2 diabetes, left ventricular thrombus on Eliquis , PAF, coronary artery disease, chronic leg wounds, normal ejection fraction of 20% presented from heart failure office to the emergency room with more than 40 pounds of weight gain.  Since being admitted and treated for heart failure.  Subjective: Patient seen and examined.  Denies any complaints.  Patient tells me that he lost 42 pounds of weight in the last 5 days and feels better.  He is due to have his Unna boot change.  Remains on low-dose milrinone . Assessment & Plan:    Acute on chronic systolic congestive heart failure with history of ischemic cardiomyopathy: No ejection fraction of 20% and grade 3 diastolic dysfunction.  Elevated BNP 1354.  Heart failure team is following.  Patient is on milrinone , midodrine  5 mg 3 times daily, currently on IV Lasix , Jardiance , digoxin . Heart failure team continues to follow.  Plan is to gradually wean off milrinone .  Left ventricular thrombus: Therapeutic on Eliquis .  Coronary artery disease: Status post PCI to LAD and RCA in 2020.  Currently on aspirin  and high-dose statin.  Substance abuse: Patient is cocaine.  Counseled to quit.  Vasculitis lesions on the lower extremities: On the steroid cream.  Will help.  NSVT: Continues to have episodes of NSVT.  Followed by cardiology.     DVT prophylaxis:  apixaban  (ELIQUIS ) tablet 5 mg   Code Status: Full code Family Communication: None at bedside Disposition Plan: Status is: Inpatient Remains inpatient appropriate because: IV diuresis, vasoactive infusions     Consultants:  Cardiology  Procedures:  None  Antimicrobials:  None     Objective: Vitals:   06/01/23 0150 06/01/23 0519 06/01/23 0730 06/01/23 1157  BP: 108/73 104/78 113/74 97/76   Pulse: (!) 107 (!) 103 96 90  Resp:  18 16 16   Temp: 99.4 F (37.4 C) 99.4 F (37.4 C) 97.8 F (36.6 C) 97.8 F (36.6 C)  TempSrc: Oral Oral Oral Oral  SpO2: 98% 96% 94% 99%  Weight:  106.8 kg    Height:        Intake/Output Summary (Last 24 hours) at 06/01/2023 1301 Last data filed at 06/01/2023 1157 Gross per 24 hour  Intake 1632 ml  Output 6915 ml  Net -5283 ml   Filed Weights   05/30/23 0648 05/31/23 0455 06/01/23 0519  Weight: 114.3 kg 108.5 kg 106.8 kg    Examination:  General exam: Appears calm and comfortable  Respiratory system: Clear to auscultation. Respiratory effort normal.  No added sounds. Cardiovascular system: S1 & S2 heard, RRR.  Gastrointestinal system: Soft.  Nontender.  Bowel sound present. Central nervous system: Alert and oriented. No focal neurological deficits. Extremities: Symmetric 5 x 5 power. Skin: Multiple vasculitic lesions on both extremities.  Both lower extremities with Unna boot applied.    Data Reviewed: I have personally reviewed following labs and imaging studies  CBC: Recent Labs  Lab 05/26/23 1740 05/27/23 1003 05/30/23 0415 05/31/23 0444 06/01/23 0717  WBC 9.8 12.5* 11.7* 10.3 10.0  NEUTROABS  --  9.8* 9.3*  --   --   HGB 14.6 15.8 14.1 14.7 14.6  HCT 45.7 48.5 42.7 44.8 45.0  MCV 82.8 81.8 79.5* 79.7* 80.1  PLT 280 302 255 244 254   Basic Metabolic Panel: Recent Labs  Lab 05/28/23 2102 05/29/23 0255  05/29/23 1217 05/30/23 0415 05/31/23 0444 06/01/23 0717  NA 132*  --  133* 131* 137 133*  K 4.2  --  3.2* 3.1* 3.4* 3.3*  CL 100  --  94* 94* 95* 92*  CO2 23  --  24 27 32 28  GLUCOSE 153*  --  131* 187* 146* 192*  BUN 25*  --  24* 22* 17 26*  CREATININE 1.28*  --  1.29* 1.32* 1.29* 1.35*  CALCIUM  8.1*  --  8.4* 8.1* 8.6* 8.4*  MG 2.2 2.2  --  2.0 2.2 2.0  PHOS  --   --   --   --  3.7 3.8   GFR: Estimated Creatinine Clearance: 76.1 mL/min (A) (by C-G formula based on SCr of 1.35 mg/dL (H)). Liver Function  Tests: Recent Labs  Lab 05/27/23 1949 05/28/23 2102  AST 71* 54*  ALT 94* 82*  ALKPHOS 88 99  BILITOT 1.0 0.9  PROT 6.5 6.6  ALBUMIN 3.0* 3.1*   No results for input(s): LIPASE, AMYLASE in the last 168 hours. No results for input(s): AMMONIA in the last 168 hours. Coagulation Profile: No results for input(s): INR, PROTIME in the last 168 hours. Cardiac Enzymes: No results for input(s): CKTOTAL, CKMB, CKMBINDEX, TROPONINI in the last 168 hours. BNP (last 3 results) No results for input(s): PROBNP in the last 8760 hours. HbA1C: No results for input(s): HGBA1C in the last 72 hours. CBG: Recent Labs  Lab 05/31/23 1041 05/31/23 1528 05/31/23 2120 06/01/23 0658 06/01/23 1152  GLUCAP 172* 147* 204* 120* 125*   Lipid Profile: No results for input(s): CHOL, HDL, LDLCALC, TRIG, CHOLHDL, LDLDIRECT in the last 72 hours. Thyroid  Function Tests: No results for input(s): TSH, T4TOTAL, FREET4, T3FREE, THYROIDAB in the last 72 hours. Anemia Panel: No results for input(s): VITAMINB12, FOLATE, FERRITIN, TIBC, IRON, RETICCTPCT in the last 72 hours. Sepsis Labs: Recent Labs  Lab 05/28/23 2102  LATICACIDVEN 1.5    No results found for this or any previous visit (from the past 240 hours).       Radiology Studies: No results found.      Scheduled Meds:  apixaban   5 mg Oral BID   aspirin  EC  81 mg Oral Q breakfast   atorvastatin   80 mg Oral Daily   Chlorhexidine  Gluconate Cloth  6 each Topical Daily   collagenase   1 Application Topical Daily   digoxin   0.125 mg Oral Daily   empagliflozin   25 mg Oral Daily   famotidine   20 mg Oral BID   fenofibrate   160 mg Oral Daily   insulin  aspart  0-9 Units Subcutaneous TID WC   midodrine   5 mg Oral TID WC   pantoprazole   40 mg Oral Daily   potassium chloride   40 mEq Oral Q6H   silver  sulfADIAZINE   1 Application Topical Daily   sodium chloride  flush  10-40 mL Intracatheter Q12H    sodium chloride  flush  3 mL Intravenous Q12H   Continuous Infusions:  milrinone  0.125 mcg/kg/min (06/01/23 0857)     LOS: 5 days    Time spent: 40 minutes    Renato Applebaum, MD [facility]

## 2023-06-01 NOTE — Progress Notes (Signed)
 Mobility Specialist Progress Note:    06/01/23 1158  Mobility  Activity Ambulated with assistance in hallway  Level of Assistance Standby assist, set-up cues, supervision of patient - no hands on  Assistive Device Other (Comment) (IV Pole)  Distance Ambulated (ft) 250 ft  Activity Response Tolerated well  Mobility Referral Yes  Mobility visit 1 Mobility  Mobility Specialist Start Time (ACUTE ONLY) 1000  Mobility Specialist Stop Time (ACUTE ONLY) 1010  Mobility Specialist Time Calculation (min) (ACUTE ONLY) 10 min   Received pt in doorway requesting to ambulate, having no complaints and agreeable to mobility. Pt was asymptomatic throughout ambulation and returned to room w/o fault. Left seated EOB w/ call bell in reach and all needs met.   Thersia Minder Mobility Specialist  Please contact vis Secure Chat or  Rehab Office [phone]

## 2023-06-01 NOTE — Plan of Care (Signed)
  Problem: Clinical Measurements: Goal: Ability to maintain clinical measurements within normal limits will improve Outcome: Progressing   Problem: Clinical Measurements: Goal: Will remain free from infection Outcome: Progressing   Problem: Nutrition: Goal: Adequate nutrition will be maintained Outcome: Progressing   Problem: Coping: Goal: Level of anxiety will decrease Outcome: Progressing   Problem: Pain Management: Goal: General experience of comfort will improve Outcome: Progressing

## 2023-06-01 NOTE — Progress Notes (Signed)
 Advanced Heart Failure Rounding Note  Cardiologist: Dorn Lesches, MD  AHF MD: Dr. Zenaida  Chief Complaint: Acute/Chronic HFrEf  Subjective:    Remains on Milrinone  0.25. Co-ox 72%   Brisk diuresis yesterday, 7.6L in UOP. Overall net negative 21L since admit. Wt down 42 lb. No CVP set up   Scr 1.29>>1.35 K. 3.3 Mg 2.0   SBPs low 100s-110s on midodrine .   Runs of VT overnight, 10 and 30 beats but asymptomatic.   Sitting up on side of bed. Jovial this morning. Says he feel much better. Breathing and functional status much improved. Able to ambulate up and down the unit this weekend w/o exertional dyspnea. Denies CP.   Objective:   Weight Range: 106.8 kg Body mass index is 27.93 kg/m.   Vital Signs:   Temp:  [97.6 F (36.4 C)-99.4 F (37.4 C)] 97.8 F (36.6 C) (01/13 0730) Pulse Rate:  [86-107] 96 (01/13 0730) Resp:  [16-18] 16 (01/13 0730) BP: (95-119)/(69-91) 113/74 (01/13 0730) SpO2:  [94 %-100 %] 94 % (01/13 0730) Weight:  [106.8 kg] 106.8 kg (01/13 0519) Last BM Date : 05/31/23  Weight change: Filed Weights   05/30/23 0648 05/31/23 0455 06/01/23 0519  Weight: 114.3 kg 108.5 kg 106.8 kg    Intake/Output:   Intake/Output Summary (Last 24 hours) at 06/01/2023 0805 Last data filed at 06/01/2023 0500 Gross per 24 hour  Intake 1992 ml  Output 7575 ml  Net -5583 ml      Physical Exam   General:  disheveled appearing. No respiratory difficulty HEENT: normal Neck: supple. JVD 6 cm Carotids 2+ bilat; no bruits. No lymphadenopathy or thyromegaly appreciated. Cor: PMI nondisplaced. Regular rate & rhythm. No rubs, gallops or murmurs. Lungs: clear Abdomen: soft, nontender, nondistended. No hepatosplenomegaly. No bruits or masses. Good bowel sounds. Extremities: no cyanosis, clubbing, rash, trace b/l LE, b/l unna boots w/ chronic b/l lower ext bleeding wounds + RUE PICC Neuro: alert & oriented x 3, cranial nerves grossly intact. moves all 4 extremities w/o  difficulty. Affect pleasant.  Telemetry   SR 90s w/ 2 runs of VT overnight, 10 and 30 beats, personally reviewed   EKG    N/A  Labs    CBC Recent Labs    05/30/23 0415 05/31/23 0444 06/01/23 0717  WBC 11.7* 10.3 10.0  NEUTROABS 9.3*  --   --   HGB 14.1 14.7 14.6  HCT 42.7 44.8 45.0  MCV 79.5* 79.7* 80.1  PLT 255 244 254   Basic Metabolic Panel Recent Labs    98/87/74 0444 06/01/23 0717  NA 137 133*  K 3.4* 3.3*  CL 95* 92*  CO2 32 28  GLUCOSE 146* 192*  BUN 17 26*  CREATININE 1.29* 1.35*  CALCIUM  8.6* 8.4*  MG 2.2 2.0  PHOS 3.7 3.8   Liver Function Tests No results for input(s): AST, ALT, ALKPHOS, BILITOT, PROT, ALBUMIN in the last 72 hours.  No results for input(s): LIPASE, AMYLASE in the last 72 hours. Cardiac Enzymes No results for input(s): CKTOTAL, CKMB, CKMBINDEX, TROPONINI in the last 72 hours.  BNP: BNP (last 3 results) Recent Labs    04/18/23 2030 05/05/23 0125 05/26/23 1740  BNP 1,039.0* 1,045.5* 1,354.6*    ProBNP (last 3 results) No results for input(s): PROBNP in the last 8760 hours.   D-Dimer No results for input(s): DDIMER in the last 72 hours. Hemoglobin A1C No results for input(s): HGBA1C in the last 72 hours. Fasting Lipid Panel No results for input(s):  CHOL, HDL, LDLCALC, TRIG, CHOLHDL, LDLDIRECT in the last 72 hours. Thyroid  Function Tests No results for input(s): TSH, T4TOTAL, T3FREE, THYROIDAB in the last 72 hours.  Invalid input(s): FREET3  Other results:   Imaging    No results found.    Medications:     Scheduled Medications:  apixaban   5 mg Oral BID   aspirin  EC  81 mg Oral Q breakfast   atorvastatin   80 mg Oral Daily   Chlorhexidine  Gluconate Cloth  6 each Topical Daily   collagenase   1 Application Topical Daily   digoxin   0.125 mg Oral Daily   empagliflozin   25 mg Oral Daily   famotidine   20 mg Oral BID   fenofibrate   160 mg Oral Daily    furosemide   80 mg Intravenous Q12H   insulin  aspart  0-9 Units Subcutaneous TID WC   midodrine   5 mg Oral TID WC   pantoprazole   40 mg Oral Daily   potassium chloride   40 mEq Oral BID   silver  sulfADIAZINE   1 Application Topical Daily   sodium chloride  flush  10-40 mL Intracatheter Q12H   sodium chloride  flush  3 mL Intravenous Q12H    Infusions:  milrinone  0.25 mcg/kg/min (05/31/23 2019)    PRN Medications: acetaminophen , fluticasone , ondansetron  **OR** ondansetron  (ZOFRAN ) IV, mouth rinse, sodium chloride  flush    Patient Profile   Evan [name] is a 58 y.o. 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   Assessment/Plan  Acute on chronic systolic heart failure, Mixed NICM/ICM -Patient has stage D biventricular failure with unfortunately no options for advanced therapies at this time due to recurrent drug use and poor compliance.  UDS positive for cocaine.  - on milrinone  0.25 mcg/kg/min for low-output, Co-ox 72%. - diuresed well w/ milrinone  support, down 42 lb. Near euvolemic on exam. No CVP set up. Received am dose of IV lasix  already, 80 mg. Will transition to PO diuretics tomorrow - reduce milrinone  to 0.125 and if co-ox stable, will stop tomorrow.  - GDMT limited due to hypotension, requiring midodrine  5 tid - may ultimately be able to wean midodrine  once off milrinone  and add lose dose GDMT - Continue Digoxin  125 mcg daily - Continue Jardiance  25 mg daily  2.  LV thrombus -Continue apixaban  5 mg twice daily   3.  NSVT/ VT  - 30 beat run overnight, asymptomatic  - K 3.3, give K supp. Mg ok at  2.0 - wean milrinone  down to 0.125  - long term, not a candidate for ICD given substance use and chronic LE wounds/ risk for systemic infection    4.  Coronary artery disease - Status post PCI to the LAD and RCA in 2020 - denies CP  - continue Aspirin  81 mg daily - continue atorvastatin  80 mg daily   5.  Polysubstance abuse - UDS + cocaine  - imperative to quit   6. Hypokalemia - K 3.3 w/ aggressive diuresis yesterday  - give K supp. D/w pharmacy   Length of Stay: [address] [place]  06/01/2023, 8:05 AM  Advanced Heart Failure Team Pager [phone] (M-F; 7a - 5p)  Please contact [facility] for night-coverage after hours (5p -7a )  and weekends on [email or web address]

## 2023-06-02 ENCOUNTER — Ambulatory Visit: Payer: Medicaid Other | Admitting: Orthopedic Surgery

## 2023-06-02 DIAGNOSIS — I5021 Acute systolic (congestive) heart failure: Secondary | ICD-10-CM | POA: Diagnosis not present

## 2023-06-02 DIAGNOSIS — I5023 Acute on chronic systolic (congestive) heart failure: Secondary | ICD-10-CM | POA: Diagnosis not present

## 2023-06-02 LAB — GLUCOSE, CAPILLARY
Glucose-Capillary: 143 mg/dL — ABNORMAL HIGH (ref 70–99)
Glucose-Capillary: 148 mg/dL — ABNORMAL HIGH (ref 70–99)
Glucose-Capillary: 182 mg/dL — ABNORMAL HIGH (ref 70–99)
Glucose-Capillary: 185 mg/dL — ABNORMAL HIGH (ref 70–99)
Glucose-Capillary: 127 mg/dL — ABNORMAL HIGH (ref 70–99)

## 2023-06-02 LAB — BASIC METABOLIC PANEL
Anion gap: 11 (ref 5–15)
BUN: 23 mg/dL — ABNORMAL HIGH (ref 6–20)
CO2: 29 mmol/L (ref 22–32)
Calcium: 8.7 mg/dL — ABNORMAL LOW (ref 8.9–10.3)
Chloride: 97 mmol/L — ABNORMAL LOW (ref 98–111)
Creatinine, Ser: 1.17 mg/dL (ref 0.61–1.24)
GFR, Estimated: >60 mL/min (ref >60)
Glucose, Bld: 137 mg/dL — ABNORMAL HIGH (ref 70–99)
Potassium: 4.2 mmol/L (ref 3.5–5.1)
Sodium: 137 mmol/L (ref 135–145)

## 2023-06-02 LAB — COOXEMETRY PANEL
Carboxyhemoglobin: 1 % (ref 0.5–1.5)
Carboxyhemoglobin: 1.1 % (ref 0.5–1.5)
Methemoglobin: 0.7 % (ref 0.0–1.5)
Methemoglobin: 0.9 % (ref 0.0–1.5)
O2 Saturation: 51.2 %
O2 Saturation: 50.2 %
Total hemoglobin: 15.6 g/dL (ref 12.0–16.0)
Total hemoglobin: 15.8 g/dL (ref 12.0–16.0)

## 2023-06-02 LAB — MAGNESIUM: Magnesium: 2.2 mg/dL (ref 1.7–2.4)

## 2023-06-02 MED ORDER — TORSEMIDE 20 MG PO TABS
40.0000 mg | ORAL_TABLET | Freq: Every day | ORAL | Status: DC
  Administered 2023-06-02 – 2023-06-03 (×2): 40 mg via ORAL
  Filled 2023-06-02 (×2): qty 2

## 2023-06-02 MED ORDER — FUROSEMIDE 40 MG PO TABS
40.0000 mg | ORAL_TABLET | Freq: Every day | ORAL | Status: DC
  Administered 2023-06-02: 40 mg via ORAL
  Filled 2023-06-02: qty 1

## 2023-06-02 MED ORDER — POTASSIUM CHLORIDE CRYS ER 20 MEQ PO TBCR
40.0000 meq | EXTENDED_RELEASE_TABLET | Freq: Every day | ORAL | Status: DC
  Administered 2023-06-03: 40 meq via ORAL
  Filled 2023-06-02: qty 2

## 2023-06-02 NOTE — Progress Notes (Signed)
 Advanced Heart Failure Rounding Note  Cardiologist: Dorn Lesches, MD  AHF MD: Dr. Zenaida  Chief Complaint: Acute/Chronic HFrEf  Subjective:    Milrinone  reduced yesterday to 0.125. Drop in early AM co-ox today from 72% previous to 51% (repeat pending)  Hypotension charted overnight w/ losartan , SBP 80s. No orthostatic symptoms.   Scr however much improved, 1.35>>1.17  Diuresed another 4L yesterday.   Feels ok today. Sitting up on side of bed. No chest pain or dyspnea.   Objective:   Weight Range: 107.8 kg Body mass index is 28.18 kg/m.   Vital Signs:   Temp:  [97 F (36.1 C)-98.3 F (36.8 C)] 98.1 F (36.7 C) (01/14 0428) Pulse Rate:  [90-111] 99 (01/14 0428) Resp:  [16-18] 17 (01/14 0428) BP: (87-114)/(59-77) 87/59 (01/14 0428) SpO2:  [95 %-100 %] 95 % (01/14 0428) Weight:  [107.8 kg] 107.8 kg (01/14 0453) Last BM Date : 05/31/23  Weight change: Filed Weights   05/31/23 0455 06/01/23 0519 06/02/23 0453  Weight: 108.5 kg 106.8 kg 107.8 kg    Intake/Output:   Intake/Output Summary (Last 24 hours) at 06/02/2023 0738 Last data filed at 06/02/2023 9357 Gross per 24 hour  Intake 1080 ml  Output 5015 ml  Net -3935 ml      Physical Exam   General:  disheveled appearing. No respiratory difficulty HEENT: normal Neck: supple.  JVD not elevated Carotids 2+ bilat; no bruits. No lymphadenopathy or thyromegaly appreciated. Cor: PMI nondisplaced. Regular rate & rhythm. No rubs, gallops or murmurs. Lungs: clear Abdomen: soft, nontender, nondistended. No hepatosplenomegaly. No bruits or masses. Good bowel sounds. Extremities: no cyanosis, clubbing, rash, edema + chronic b/l LE wounds, + unna boots  Neuro: alert & oriented x 3, cranial nerves grossly intact. moves all 4 extremities w/o difficulty. Affect pleasant.  Telemetry   NSR 90s w/ occasional PVCs. No further VT personally reviewed   EKG    N/A  Labs    CBC Recent Labs    05/31/23 0444  06/01/23 0717  WBC 10.3 10.0  HGB 14.7 14.6  HCT 44.8 45.0  MCV 79.7* 80.1  PLT 244 254   Basic Metabolic Panel Recent Labs    98/87/74 0444 06/01/23 0717 06/02/23 0521  NA 137 133* 137  K 3.4* 3.3* 4.2  CL 95* 92* 97*  CO2 32 28 29  GLUCOSE 146* 192* 137*  BUN 17 26* 23*  CREATININE 1.29* 1.35* 1.17  CALCIUM  8.6* 8.4* 8.7*  MG 2.2 2.0 2.2  PHOS 3.7 3.8  --    Liver Function Tests No results for input(s): AST, ALT, ALKPHOS, BILITOT, PROT, ALBUMIN in the last 72 hours.  No results for input(s): LIPASE, AMYLASE in the last 72 hours. Cardiac Enzymes No results for input(s): CKTOTAL, CKMB, CKMBINDEX, TROPONINI in the last 72 hours.  BNP: BNP (last 3 results) Recent Labs    04/18/23 2030 05/05/23 0125 05/26/23 1740  BNP 1,039.0* 1,045.5* 1,354.6*    ProBNP (last 3 results) No results for input(s): PROBNP in the last 8760 hours.   D-Dimer No results for input(s): DDIMER in the last 72 hours. Hemoglobin A1C No results for input(s): HGBA1C in the last 72 hours. Fasting Lipid Panel No results for input(s): CHOL, HDL, LDLCALC, TRIG, CHOLHDL, LDLDIRECT in the last 72 hours. Thyroid  Function Tests No results for input(s): TSH, T4TOTAL, T3FREE, THYROIDAB in the last 72 hours.  Invalid input(s): FREET3  Other results:   Imaging    No results found.  Medications:     Scheduled Medications:  apixaban   5 mg Oral BID   aspirin  EC  81 mg Oral Q breakfast   atorvastatin   80 mg Oral Daily   Chlorhexidine  Gluconate Cloth  6 each Topical Daily   collagenase   1 Application Topical Daily   digoxin   0.125 mg Oral Daily   empagliflozin   25 mg Oral Daily   famotidine   20 mg Oral BID   fenofibrate   160 mg Oral Daily   insulin  aspart  0-9 Units Subcutaneous TID WC   losartan   12.5 mg Oral QHS   midodrine   5 mg Oral TID WC   pantoprazole   40 mg Oral Daily   potassium chloride   40 mEq Oral Q6H   silver   sulfADIAZINE   1 Application Topical Daily   sodium chloride  flush  10-40 mL Intracatheter Q12H   sodium chloride  flush  3 mL Intravenous Q12H    Infusions:  milrinone  0.125 mcg/kg/min (06/02/23 0642)    PRN Medications: acetaminophen , fluticasone , ondansetron  **OR** ondansetron  (ZOFRAN ) IV, mouth rinse, sodium chloride  flush    Patient Profile   Evan [name] is a 58 y.o. 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   Assessment/Plan  Acute on chronic systolic heart failure, Mixed NICM/ICM -Patient has stage D biventricular failure with unfortunately no options for advanced therapies at this time due to recurrent drug use and poor compliance.  UDS positive for cocaine.  - on milrinone  0.125 mcg/kg/min for low-output, 51% today. Repeat pending. If repeat ok, will turn off milrinone   - diuresed well w/ milrinone  support, down 40 lb. Euvolemic on exam. Transition back to PO Lasix  40 mg daily  - GDMT limited due to hypotension, requiring midodrine  5 tid - if BP remains soft throughout today, will d/c losartan   - Continue Digoxin  125 mcg daily - Continue Jardiance  25 mg daily   2.  LV thrombus -Continue apixaban  5 mg twice daily   3.  NSVT/ VT  - 30 beat run 1/13, asymptomatic  - no further recurrence, K and Mg WNL today  - continue to wean milrinone   - long term, not a candidate for ICD given substance use and chronic LE wounds/ risk for systemic infection    4.  Coronary artery disease - Status post PCI to the LAD and RCA in 2020 - denies CP  - continue Aspirin  81 mg daily - continue atorvastatin  80  mg daily   5.  Polysubstance abuse - UDS + cocaine  - imperative to quit   6. Hypokalemia - resolved w/ K supp    Length of Stay: [address] [place]  06/02/2023, 7:38 AM  Advanced Heart Failure Team Pager [phone] (M-F; 7a - 5p)  Please contact [facility] for night-coverage after hours (5p -7a ) and weekends on [email or web address]

## 2023-06-02 NOTE — Progress Notes (Signed)
 Orthopedic Tech Progress Note Patient Details:  Evan [name] February 16, 1966 [id number]  Called the floor to see if patient was ready so I could apply a fresh pair RN stated yes patient was ready, 8 minutes later I showed up and nothing had been done.. will try again some point of the day   Patient ID: Evan [name], male   DOB: [birth year], 58 y.o.   MRN: [id number]  [name] Pac 06/02/2023, 9:37 AM

## 2023-06-02 NOTE — Progress Notes (Signed)
 PROGRESS NOTE    Evan [name]  FMW:[id number] DOB: May 17, 1966 DOA: 05/26/2023 PCP: Dettinger, Fonda LABOR, MD    Brief Narrative:  58 year old with history of hypertension, hyperlipidemia, type 2 diabetes, left ventricular thrombus on Eliquis , PAF, coronary artery disease, chronic leg wounds, normal ejection fraction of 20% presented from heart failure office to the emergency room with more than 40 pounds of weight gain.  Since being admitted and treated for heart failure.  Subjective: Patient seen and examined.  No complaints today.  Ready to get [employer]  Breathing better.  Tapered off milrinone  today.  Started on torsemide .  Anticipating home tomorrow as per cardiology. Urine output 5 L / 24 hours. Blood pressure low normal.  Patient asymptomatic.   Assessment & Plan:    Acute on chronic systolic congestive heart failure with history of ischemic cardiomyopathy: No ejection fraction of 20% and grade 3 diastolic dysfunction.  Elevated BNP 1354.  Heart failure team is following.  Patient is on milrinone , midodrine  5 mg 3 times daily, currently on IV Lasix , Jardiance , digoxin . Heart failure team continues to follow.   Plan to wean off milrinone  today.  Changing to torsemide  today.  Monitor today in the hospital.  Left ventricular thrombus: Therapeutic on Eliquis .  Coronary artery disease: Status post PCI to LAD and RCA in 2020.  Currently on aspirin  and high-dose statin.  Substance abuse: Patient uses cocaine.  Counseled to quit.  Vasculitis lesions on the lower extremities: On the steroid cream.  Changing Unna boot today.    DVT prophylaxis:  apixaban  (ELIQUIS ) tablet 5 mg   Code Status: Full code Family Communication: None at bedside Disposition Plan: Status is: Inpatient Remains inpatient appropriate because: IV diuresis, tapering off milrinone      Consultants:  Cardiology  Procedures:  None  Antimicrobials:  None     Objective: Vitals:   06/02/23 0006  06/02/23 0428 06/02/23 0453 06/02/23 0839  BP: 114/77 (!) 87/59  121/81  Pulse: (!) 111 99  100  Resp: 18 17  18   Temp: 98.3 F (36.8 C) 98.1 F (36.7 C)  98 F (36.7 C)  TempSrc: Oral Oral  Oral  SpO2: 100% 95%  100%  Weight:   107.8 kg   Height:        Intake/Output Summary (Last 24 hours) at 06/02/2023 1113 Last data filed at 06/02/2023 0800 Gross per 24 hour  Intake 780 ml  Output 3390 ml  Net -2610 ml   Filed Weights   05/31/23 0455 06/01/23 0519 06/02/23 0453  Weight: 108.5 kg 106.8 kg 107.8 kg    Examination:  General exam: Appears calm and comfortable.  On room air. Respiratory system: Clear to auscultation. Respiratory effort normal.  No added sounds. Cardiovascular system: S1 & S2 heard, RRR.  Gastrointestinal system: Soft.  Nontender.  Bowel sound present. Central nervous system: Alert and oriented. No focal neurological deficits. Extremities: Symmetric 5 x 5 power. Skin: Multiple vasculitic lesions on both extremities.      Data Reviewed: I have personally reviewed following labs and imaging studies  CBC: Recent Labs  Lab 05/26/23 1740 05/27/23 1003 05/30/23 0415 05/31/23 0444 06/01/23 0717  WBC 9.8 12.5* 11.7* 10.3 10.0  NEUTROABS  --  9.8* 9.3*  --   --   HGB 14.6 15.8 14.1 14.7 14.6  HCT 45.7 48.5 42.7 44.8 45.0  MCV 82.8 81.8 79.5* 79.7* 80.1  PLT 280 302 255 244 254   Basic Metabolic Panel: Recent Labs  Lab 05/29/23 0255  05/29/23 1217 05/30/23 0415 05/31/23 0444 06/01/23 0717 06/02/23 0521  NA  --  133* 131* 137 133* 137  K  --  3.2* 3.1* 3.4* 3.3* 4.2  CL  --  94* 94* 95* 92* 97*  CO2  --  24 27 32 28 29  GLUCOSE  --  131* 187* 146* 192* 137*  BUN  --  24* 22* 17 26* 23*  CREATININE  --  1.29* 1.32* 1.29* 1.35* 1.17  CALCIUM   --  8.4* 8.1* 8.6* 8.4* 8.7*  MG 2.2  --  2.0 2.2 2.0 2.2  PHOS  --   --   --  3.7 3.8  --    GFR: Estimated Creatinine Clearance: 95.2 mL/min (by C-G formula based on SCr of 1.17 mg/dL). Liver  Function Tests: Recent Labs  Lab 05/27/23 1949 05/28/23 2102  AST 71* 54*  ALT 94* 82*  ALKPHOS 88 99  BILITOT 1.0 0.9  PROT 6.5 6.6  ALBUMIN 3.0* 3.1*   No results for input(s): LIPASE, AMYLASE in the last 168 hours. No results for input(s): AMMONIA in the last 168 hours. Coagulation Profile: No results for input(s): INR, PROTIME in the last 168 hours. Cardiac Enzymes: No results for input(s): CKTOTAL, CKMB, CKMBINDEX, TROPONINI in the last 168 hours. BNP (last 3 results) No results for input(s): PROBNP in the last 8760 hours. HbA1C: No results for input(s): HGBA1C in the last 72 hours. CBG: Recent Labs  Lab 06/01/23 0658 06/01/23 1152 06/01/23 1547 06/01/23 2117 06/02/23 0557  GLUCAP 120* 125* 103* 150* 143*   Lipid Profile: No results for input(s): CHOL, HDL, LDLCALC, TRIG, CHOLHDL, LDLDIRECT in the last 72 hours. Thyroid  Function Tests: No results for input(s): TSH, T4TOTAL, FREET4, T3FREE, THYROIDAB in the last 72 hours. Anemia Panel: No results for input(s): VITAMINB12, FOLATE, FERRITIN, TIBC, IRON, RETICCTPCT in the last 72 hours. Sepsis Labs: Recent Labs  Lab 05/28/23 2102  LATICACIDVEN 1.5    No results found for this or any previous visit (from the past 240 hours).       Radiology Studies: No results found.      Scheduled Meds:  apixaban   5 mg Oral BID   aspirin  EC  81 mg Oral Q breakfast   atorvastatin   80 mg Oral Daily   Chlorhexidine  Gluconate Cloth  6 each Topical Daily   collagenase   1 Application Topical Daily   digoxin   0.125 mg Oral Daily   empagliflozin   25 mg Oral Daily   famotidine   20 mg Oral BID   fenofibrate   160 mg Oral Daily   insulin  aspart  0-9 Units Subcutaneous TID WC   midodrine   5 mg Oral TID WC   pantoprazole   40 mg Oral Daily   [START ON 06/03/2023] potassium chloride   40 mEq Oral Daily   silver  sulfADIAZINE   1 Application Topical Daily   sodium chloride   flush  10-40 mL Intracatheter Q12H   sodium chloride  flush  3 mL Intravenous Q12H   torsemide   40 mg Oral Daily   Continuous Infusions:     LOS: 6 days    Time spent: 40 minutes    Renato Applebaum, MD [facility]

## 2023-06-02 NOTE — TOC Progression Note (Signed)
 Transition of Care [facility]) - Progression Note    Patient Details  Name: Evan [name] MRN: [id number] Date of Birth: 06-09-1965  Transition of Care [facility]) CM/SW Contact  Arlana JINNY Nicholaus [place] Phone Number: [phone] 06/02/2023, 11:58 AM  Clinical Narrative:   HF CSW called and scheduled pts Hospital follow up appointment for Friday, June 19, 2023 at 2:40 PM.   [facility] will continue following.     Expected Discharge Plan: Home w Home Health Services Barriers to Discharge: Continued Medical Work up  Expected Discharge Plan and Services In-house Referral: NA Discharge Planning Services: CM Consult Post Acute Care Choice: NA Living arrangements for the past 2 months: Single Family Home                   DME Agency: NA       HH Arranged: RN HH Agency: [facility] Date HH Agency Contacted: 05/29/23 Time HH Agency Contacted: 1423 Representative spoke with at [facility] Agency: [place]   Social Determinants of Health (SDOH) Interventions SDOH Screenings   Food Insecurity: No Food Insecurity (05/28/2023)  Housing: Low Risk  (05/28/2023)  Transportation Needs: No Transportation Needs (05/28/2023)  Utilities: Not At Risk (05/28/2023)  Alcohol Screen: Low Risk  (05/28/2023)  Depression (PHQ2-9): Medium Risk (05/21/2023)  Financial Resource Strain: Low Risk  (05/28/2023)  Tobacco Use: Low Risk  (05/26/2023)    Readmission Risk Interventions    05/07/2023   11:32 AM  Readmission Risk Prevention Plan  Transportation Screening Complete  PCP or Specialist Appt within 5-7 Days Complete  Home Care Screening Complete  Medication Review (RN CM) Complete

## 2023-06-02 NOTE — Plan of Care (Signed)
   Problem: Education: Goal: Knowledge of General Education information will improve Description Including pain rating scale, medication(s)/side effects and non-pharmacologic comfort measures Outcome: Progressing

## 2023-06-02 NOTE — Progress Notes (Signed)
 Orthopedic Tech Progress Note Patient Details:  Evan [name] December 25, 1965 [id number]  Ortho Devices Type of Ortho Device: Ace wrap, Unna boot Ortho Device/Splint Location: BLE Ortho Device/Splint Interventions: Ordered, Application, Adjustment   Post Interventions Patient Tolerated: Well Instructions Provided: Care of device  [name] Pac 06/02/2023, 11:34 AM

## 2023-06-03 ENCOUNTER — Other Ambulatory Visit (HOSPITAL_COMMUNITY): Payer: Self-pay

## 2023-06-03 DIAGNOSIS — I5023 Acute on chronic systolic (congestive) heart failure: Secondary | ICD-10-CM | POA: Diagnosis not present

## 2023-06-03 DIAGNOSIS — I5021 Acute systolic (congestive) heart failure: Secondary | ICD-10-CM | POA: Diagnosis not present

## 2023-06-03 LAB — COOXEMETRY PANEL
Carboxyhemoglobin: 1.3 % (ref 0.5–1.5)
Methemoglobin: 0.8 % (ref 0.0–1.5)
O2 Saturation: 56.5 %
Total hemoglobin: 15 g/dL (ref 12.0–16.0)

## 2023-06-03 LAB — BASIC METABOLIC PANEL
Anion gap: 10 (ref 5–15)
BUN: 24 mg/dL — ABNORMAL HIGH (ref 6–20)
CO2: 25 mmol/L (ref 22–32)
Calcium: 8.6 mg/dL — ABNORMAL LOW (ref 8.9–10.3)
Chloride: 97 mmol/L — ABNORMAL LOW (ref 98–111)
Creatinine, Ser: 1.3 mg/dL — ABNORMAL HIGH (ref 0.61–1.24)
GFR, Estimated: >60 mL/min (ref >60)
Glucose, Bld: 146 mg/dL — ABNORMAL HIGH (ref 70–99)
Potassium: 4.3 mmol/L (ref 3.5–5.1)
Sodium: 132 mmol/L — ABNORMAL LOW (ref 135–145)

## 2023-06-03 LAB — DIGOXIN LEVEL: Digoxin Level: <0.2 ng/mL — ABNORMAL LOW (ref 0.8–2.0)

## 2023-06-03 LAB — GLUCOSE, CAPILLARY
Glucose-Capillary: 142 mg/dL — ABNORMAL HIGH (ref 70–99)
Glucose-Capillary: 132 mg/dL — ABNORMAL HIGH (ref 70–99)

## 2023-06-03 MED ORDER — MIDODRINE HCL 5 MG PO TABS
2.5000 mg | ORAL_TABLET | Freq: Three times a day (TID) | ORAL | Status: DC
  Administered 2023-06-03: 2.5 mg via ORAL
  Filled 2023-06-03: qty 1

## 2023-06-03 MED ORDER — DIGOXIN 125 MCG PO TABS
0.1250 mg | ORAL_TABLET | Freq: Every day | ORAL | 2 refills | Status: DC
  Filled 2023-06-03: qty 30, 30d supply, fill #0

## 2023-06-03 MED ORDER — MIDODRINE HCL 2.5 MG PO TABS
2.5000 mg | ORAL_TABLET | Freq: Three times a day (TID) | ORAL | 2 refills | Status: DC
  Filled 2023-06-03: qty 90, 30d supply, fill #0

## 2023-06-03 MED ORDER — POTASSIUM CHLORIDE CRYS ER 20 MEQ PO TBCR
20.0000 meq | EXTENDED_RELEASE_TABLET | Freq: Every day | ORAL | 0 refills | Status: DC
  Filled 2023-06-03: qty 30, 30d supply, fill #0

## 2023-06-03 MED ORDER — DIGOXIN 125 MCG PO TABS: 0.1250 mg | ORAL_TABLET | Freq: Every day | ORAL | Status: DC

## 2023-06-03 MED ORDER — APIXABAN 5 MG PO TABS
5.0000 mg | ORAL_TABLET | Freq: Two times a day (BID) | ORAL | 2 refills | Status: DC
  Filled 2023-06-03 (×2): qty 60, 30d supply, fill #0

## 2023-06-03 MED ORDER — TORSEMIDE 20 MG PO TABS
40.0000 mg | ORAL_TABLET | Freq: Every day | ORAL | 2 refills | Status: DC
  Filled 2023-06-03: qty 60, 30d supply, fill #0

## 2023-06-03 NOTE — Progress Notes (Signed)
 Advanced Heart Failure Rounding Note  Cardiologist: Lauro Portal, MD  AHF MD: Dr. Alease Amend  Chief Complaint: Acute/Chronic HFrEf  Subjective:    Co-ox 57% off of Milrinone .   Scr 1.35>>1.17>>1.30 (b/l 1.0-1.2)   I/Os not recorded yesterday however Wt stable after transition back to PO diuretics.   SBPs low 100s.   Feels well today off milrinone . Denies fatigue. No dyspnea and no orthostatic symptoms. Eager to go home.   Objective:   Weight Range: 108 kg Body mass index is 28.23 kg/m.   Vital Signs:   Temp:  [97.4 F (36.3 C)-98 F (36.7 C)] 97.8 F (36.6 C) (01/15 0620) Pulse Rate:  [83-112] 83 (01/15 0757) Resp:  [18-19] 18 (01/15 0757) BP: (91-121)/(58-90) 101/90 (01/15 0757) SpO2:  [96 %-100 %] 99 % (01/15 0757) Weight:  [108 kg] 108 kg (01/15 0500) Last BM Date : 05/31/23  Weight change: Filed Weights   06/01/23 0519 06/02/23 0453 06/03/23 0500  Weight: 106.8 kg 107.8 kg 108 kg    Intake/Output:  No intake or output data in the 24 hours ending 06/03/23 0807     Physical Exam   General:  Well appearing. No respiratory difficulty HEENT: normal Neck: supple. JVD 7 cm. Carotids 2+ bilat; no bruits. No lymphadenopathy or thyromegaly appreciated. Cor: PMI nondisplaced. Regular rate & rhythm. No rubs, gallops or murmurs. Lungs: clear Abdomen: soft, nontender, nondistended. No hepatosplenomegaly. No bruits or masses. Good bowel sounds. Extremities: no cyanosis, clubbing, rash, trace b/l LE, b/l unna boots  Neuro: alert & oriented x 3, cranial nerves grossly intact. moves all 4 extremities w/o difficulty. Affect pleasant.   Telemetry   NSR 90s. No further VT personally reviewed   EKG    N/A  Labs    CBC Recent Labs    06/01/23 0717  WBC 10.0  HGB 14.6  HCT 45.0  MCV 80.1  PLT 254   Basic Metabolic Panel Recent Labs    81/19/14 0717 06/02/23 0521 06/03/23 0330  NA 133* 137 132*  K 3.3* 4.2 4.3  CL 92* 97* 97*  CO2 28 29 25    GLUCOSE 192* 137* 146*  BUN 26* 23* 24*  CREATININE 1.35* 1.17 1.30*  CALCIUM  8.4* 8.7* 8.6*  MG 2.0 2.2  --   PHOS 3.8  --   --    Liver Function Tests No results for input(s): "AST", "ALT", "ALKPHOS", "BILITOT", "PROT", "ALBUMIN" in the last 72 hours.  No results for input(s): "LIPASE", "AMYLASE" in the last 72 hours. Cardiac Enzymes No results for input(s): "CKTOTAL", "CKMB", "CKMBINDEX", "TROPONINI" in the last 72 hours.  BNP: BNP (last 3 results) Recent Labs    04/18/23 2030 05/05/23 0125 05/26/23 1740  BNP 1,039.0* 1,045.5* 1,354.6*    ProBNP (last 3 results) No results for input(s): "PROBNP" in the last 8760 hours.   D-Dimer No results for input(s): "DDIMER" in the last 72 hours. Hemoglobin A1C No results for input(s): "HGBA1C" in the last 72 hours. Fasting Lipid Panel No results for input(s): "CHOL", "HDL", "LDLCALC", "TRIG", "CHOLHDL", "LDLDIRECT" in the last 72 hours. Thyroid Function Tests No results for input(s): "TSH", "T4TOTAL", "T3FREE", "THYROIDAB" in the last 72 hours.  Invalid input(s): "FREET3"  Other results:   Imaging    No results found.    Medications:     Scheduled Medications:  apixaban   5 mg Oral BID   aspirin  EC  81 mg Oral Q breakfast   atorvastatin   80 mg Oral Daily   Chlorhexidine  Gluconate  Cloth  6 each Topical Daily   collagenase   1 Application Topical Daily   digoxin   0.125 mg Oral Daily   empagliflozin   25 mg Oral Daily   famotidine   20 mg Oral BID   fenofibrate   160 mg Oral Daily   insulin  aspart  0-9 Units Subcutaneous TID WC   midodrine   5 mg Oral TID WC   pantoprazole   40 mg Oral Daily   potassium chloride   40 mEq Oral Daily   silver  sulfADIAZINE   1 Application Topical Daily   sodium chloride  flush  10-40 mL Intracatheter Q12H   sodium chloride  flush  3 mL Intravenous Q12H   torsemide   40 mg Oral Daily    Infusions:    PRN Medications: acetaminophen , fluticasone , ondansetron  **OR** ondansetron   (ZOFRAN ) IV, mouth rinse, sodium chloride  flush    Patient Profile   Evan [name] is a 58 y.o. 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   Assessment/Plan  Acute on chronic systolic heart failure, Mixed NICM/ICM -Patient has stage D biventricular failure with unfortunately no options for advanced therapies at this time due to recurrent drug use and poor compliance.  UDS positive for cocaine. - diuresed well w/ milrinone  support, down 40 lb. Euvolemic on exam and stable back on PO torsemide , continue 40 mg daily   - now off milrinone . Co-ox 57% today  - GDMT limited due to hypotension, requiring midodrine . Reduce to 2.5 tid - Continue Digoxin  125 mcg daily - Continue Jardiance  25 mg daily - start spiro 12.5 mg at bedtime - continue torsemide  40 mg daily    2.  LV thrombus - Continue apixaban  5 mg twice daily   3.  NSVT/ VT  - 30 beat run 1/13, asymptomatic  - now off milrinone  w/ no further recurrence - K and Mg WNL today  - long term, not a candidate for ICD given substance use and chronic LE wounds/ risk for systemic infection    4.  Coronary artery disease - Status post PCI to the LAD and RCA in 2020 - denies CP  - continue Aspirin  81 mg daily - continue atorvastatin  80 mg daily   5.  Polysubstance abuse - UDS + cocaine  - imperative to quit   6. Hypokalemia - resolved w/ K supp, K 4.3 today   Stable from HF standpoint for discharge home today. Will arrange close clinic f/u in the [facility] and will place appt info in AVS.   Cardiac Meds for Discharge Eliquis  5 mg bid  ASA 81 mg daily   Atorvastatin  80 mg daily  Digoxin  0.125 mg daily  Jardiance  25 mg daily  Midodrine  2.5 mg tid  Torsemide  40 mg daily  Spironolactone  12.5 mg at bedtime  KCl 20 mEq daily    Length of Stay: [address] PA-C  06/03/2023, 8:07 AM  Advanced Heart Failure Team Pager [phone] (M-F; 7a - 5p)  Please contact [facility] for night-coverage after hours (5p -7a ) and weekends on [email or web address]

## 2023-06-03 NOTE — TOC Transition Note (Addendum)
 Transition of Care [facility]) - Discharge Note   Patient Details  Name: Evan [name] MRN: [id number] Date of Birth: Jan 25, 1966  Transition of [facility]) CM/SW Contact:  Jennett Model, RN Phone Number: 06/03/2023, 11:07 AM   Clinical Narrative:    For dc today, NCM notified Kelly with [facility].  He has transport.  TOC to fill meds, eliquis  will be 4.00 thru Medicaid.      Barriers to Discharge: Continued Medical Work up   Patient Goals and CMS Choice Patient states their goals for this hospitalization and ongoing recovery are:: return home   Choice offered to / list presented to : NA      Discharge Placement                       Discharge Plan and Services Additional resources added to the After Visit Summary for   In-house Referral: NA Discharge Planning Services: CM Consult Post Acute Care Choice: NA            DME Agency: NA       HH Arranged: RN HH Agency: [facility] Date [facility] Agency Contacted: 05/29/23 Time HH Agency Contacted: 1423 Representative spoke with at [facility] Agency: Loetta Ringer  Social Drivers of Health (SDOH) Interventions SDOH Screenings   Food Insecurity: No Food Insecurity (05/28/2023)  Housing: Low Risk  (05/28/2023)  Transportation Needs: No Transportation Needs (05/28/2023)  Utilities: Not At Risk (05/28/2023)  Alcohol Screen: Low Risk  (05/28/2023)  Depression (PHQ2-9): Medium Risk (05/21/2023)  Financial Resource Strain: Low Risk  (05/28/2023)  Tobacco Use: Low Risk  (05/26/2023)     Readmission Risk Interventions    05/07/2023   11:32 AM  Readmission Risk Prevention Plan  Transportation Screening Complete  PCP or Specialist Appt within 5-7 Days Complete  Home Care Screening Complete  Medication Review (RN CM) Complete

## 2023-06-03 NOTE — Progress Notes (Signed)
 PICC Removal Note: PICC line removed from RUE. PICC catheter tip visualized and intact. Pressure held until hemostasis achieved. Pressure dressing applied. No redness, ecchymosis, edema, swelling, or drainage noted at site. Instructions provided on post PICC discharge care, including followup notification instructions.  Bedrest for 30 minutes post removal.

## 2023-06-03 NOTE — Plan of Care (Signed)
   Problem: Education: Goal: Knowledge of General Education information will improve Description Including pain rating scale, medication(s)/side effects and non-pharmacologic comfort measures Outcome: Progressing   Problem: Health Behavior/Discharge Planning: Goal: Ability to manage health-related needs will improve Outcome: Progressing

## 2023-06-03 NOTE — Discharge Summary (Signed)
 Physician Discharge Summary  Evan [name] YNW:[id number] DOB: 08/01/1965 DOA: 05/26/2023  PCP: Dettinger, Lucio Sabin, MD  Admit date: 05/26/2023 Discharge date: 06/03/2023  Admitted From: Home Disposition: Home  Recommendations for Outpatient Follow-up:  Follow up with PCP in 1-2 weeks Please obtain BMP/CBC in one week Cardiology to schedule follow-up  Home Health: N/A Equipment/Devices: N/A  Discharge Condition: Stable CODE STATUS: Full code Diet recommendation: Low-salt and low-carb diet, fluid restriction 1200 mL a day.  Discharge summary: 58 year old with history of hypertension, hyperlipidemia, type 2 diabetes, left ventricular thrombus on Eliquis , PAF, coronary artery disease, chronic leg wounds, known ejection fraction of 20% presented from heart failure office to the emergency room with more than 40 pounds of weight gain.  Since being admitted and treated for heart failure.  He was aggressively treated with IV diuresis, milrinone  infusion, blood pressure support with midodrine  with good clinical response.  Lost more than 40 pounds of weight with clinical improvement.  Going home today with following recommendations from heart failure team.   Acute on chronic systolic congestive heart failure with history of ischemic cardiomyopathy: Cocaine use.  Known  ejection fraction of 20% and grade 3 diastolic dysfunction.  Elevated BNP 1354.  Heart failure team was following.  Treated with milrinone , midodrine , IV Lasix , Jardiance  and digoxin .  He was able to come off milrinone .  Currently symptomatically improved.  As per cardiology recommendations, going home on Eliquis  5 mg bid  ASA 81 mg daily  Atorvastatin  80 mg daily  Digoxin  0.125 mg daily  Jardiance  25 mg daily  Midodrine  2.5 mg tid  Torsemide  40 mg daily  Spironolactone  12.5 mg at bedtime  KCl 20 mEq daily  No beta-blockers, uses cocaine.   Left ventricular thrombus: Therapeutic on Eliquis .   Coronary artery disease: Status  post PCI to LAD and RCA in 2020.  Currently on aspirin  and high-dose statin.   Substance abuse: Patient uses cocaine.  Counseled to quit.  He is motivated to quit.   Vasculitis lesions on the lower extremities: On the steroid cream.  Changing Unna boot twice weekly.  Follows up at wound care center.    Discharge Diagnoses:  Principal Problem:   Acute HFrEF (heart failure with reduced ejection fraction) (HCC) Active Problems:   CAD S/P PCI   Essential hypertension   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)   Mixed hyperlipidemia   Ischemic cardiomyopathy   Vasculitis (HCC)   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)   Lymphedema   Nonsustained ventricular tachycardia (HCC)   History of coronary artery stent placement   Left ventricular thrombus   Pulmonary hypertension, unspecified (HCC)   OSA (obstructive sleep apnea)   Cocaine use   Atherosclerosis of native coronary artery of native heart without angina pectoris   Biventricular heart failure (HCC)   Acute decompensated heart failure [facility])    Discharge Instructions  Discharge Instructions     Diet - low sodium heart healthy   Complete by: As directed    Discharge wound care:   Complete by: As directed    Unna boot twice weekly as per wound care instructions.   Increase activity slowly   Complete by: As directed       Allergies as of 06/03/2023       Reactions   Metoprolol     Hypotension         Medication List     STOP taking these medications  carvedilol  6.25 MG tablet Commonly known as: COREG    furosemide  40 MG tablet Commonly known as: LASIX    losartan  25 MG tablet Commonly known as: COZAAR        TAKE these medications    Accu-Chek Aviva device Test BS BID Dx E11.69   Accu-Chek Aviva Plus w/Device Kit 1 each by Does not apply route 4 (four) times daily.   Accu-Chek Guide test strip Generic drug: glucose  blood Check BS in the morning and at bedtime Dx E11.8   acetaminophen  325 MG tablet Commonly known as: TYLENOL  Take 2 tablets (650 mg total) by mouth every 6 (six) hours as needed for mild pain (pain score 1-3) or fever (or Fever >/= 101).   apixaban  5 MG Tabs tablet Commonly known as: ELIQUIS  Take 1 tablet (5 mg total) by mouth 2 (two) times daily.   aspirin  EC 81 MG tablet Take 1 tablet (81 mg total) by mouth daily with breakfast. Swallow whole.   atorvastatin  80 MG tablet Commonly known as: LIPITOR  Take 1 tablet (80 mg total) by mouth daily.   BD Pen Needle Nano 2nd Gen 32G X 4 MM Misc Generic drug: Insulin  Pen Needle Use daily with insulin  Dx E11.9, Z79.4   digoxin  0.125 MG tablet Commonly known as: LANOXIN  Take 1 tablet (0.125 mg total) by mouth daily. Start taking on: June 04, 2023   empagliflozin  25 MG Tabs tablet Commonly known as: Jardiance  Take 1 tablet (25 mg total) by mouth daily.   famotidine  20 MG tablet Commonly known as: Pepcid  Take 1 tablet (20 mg total) by mouth 2 (two) times daily.   fenofibrate  145 MG tablet Commonly known as: TRICOR  Take 1 tablet (145 mg total) by mouth daily.   fluticasone  50 MCG/ACT nasal spray Commonly known as: FLONASE  Place 1 spray into both nostrils 2 (two) times daily as needed for allergies or rhinitis.   hydrOXYzine  25 MG capsule Commonly known as: VISTARIL  Take 1 capsule (25 mg total) by mouth every 8 (eight) hours as needed.   midodrine  2.5 MG tablet Commonly known as: PROAMATINE  Take 1 tablet (2.5 mg total) by mouth 3 (three) times daily with meals.   pantoprazole  40 MG tablet Commonly known as: PROTONIX  Take 40 mg by mouth daily.   potassium chloride  SA 20 MEQ tablet Commonly known as: KLOR-CON  M Take 1 tablet (20 mEq total) by mouth daily. Start taking on: June 04, 2023   Santyl  250 UNIT/GM ointment Generic drug: collagenase  Apply 1 Application topically daily.   silver  nitrate applicators 75-25  % applicator Apply topically 3 (three) times a week.   silver  sulfADIAZINE  1 % cream Commonly known as: Silvadene  Apply 1 Application topically daily.   spironolactone  25 MG tablet Commonly known as: ALDACTONE  Take 0.5 tablets (12.5 mg total) by mouth daily.   Torsemide  40 MG Tabs Take 40 mg by mouth daily. Start taking on: June 04, 2023               Discharge Care Instructions  (From admission, onward)           Start     Ordered   06/03/23 0000  Discharge wound care:       Comments: Unna boot twice weekly as per wound care instructions.   06/03/23 1054            Follow-up Information     [facility] Follow up.   Specialty: Home Health Services Why: [facility] for [facility] Contact information: [address]  [address] [place] [address] [phone]         Dettinger, Lucio Sabin, MD. Go in 16 day(s).   Specialties: Family Medicine, Cardiology Why: Hospital follow up appointment scheduled for Friday, June 19, 2023 at 2:40 PM.  PLEASE ARRIVE 10-15 minutes early.  PLEASE call to cancel/reschedule if you CANNOT make appointment. Contact information: [address] [place] [address] [phone]         Alwin Baars, DO Follow up.   Specialty: Cardiology Why: Hospital follow up in the Advanced Heart Failure Clinic at [facility] on 06/11/2023 @ 2:20 PM. PLEASE bring a current medication list to appointment FREE valet parking, Entrance C, off [employer] information: [address] [place] [address] [phone]                Allergies  Allergen Reactions   Metoprolol      Hypotension     Consultations: Cardiology   Procedures/Studies: US  EKG SITE RITE Result Date: 05/28/2023 If Site Rite image not attached, placement could not be confirmed due to current cardiac rhythm.  DG Chest 2 View Result Date: 05/26/2023 CLINICAL DATA:  Shortness of breath. EXAM: CHEST - 2 VIEW  COMPARISON:  Chest x-ray 05/21/2023 FINDINGS: The heart is borderline enlarged but stable. The mediastinal and hilar contours are within normal limits. No acute pulmonary findings. No pulmonary edema or pleural effusions. No pulmonary infiltrates or pneumothorax. The bony thorax is intact. IMPRESSION: Borderline cardiomegaly but no acute pulmonary findings. Electronically Signed   By: Marrian Siva M.D.   On: 05/26/2023 18:52   DG Chest Port 1 View Result Date: 05/06/2023 CLINICAL DATA:  Congestive heart failure. EXAM: PORTABLE CHEST 1 VIEW COMPARISON:  04/18/2023 FINDINGS: Unchanged cardiac enlargement. No signs of pleural effusion, interstitial edema or airspace disease. Visualized osseous structures are unremarkable. IMPRESSION: Cardiomegaly. No acute findings. Electronically Signed   By: Kimberley Penman M.D.   On: 05/06/2023 06:10   VAS US  LOWER EXTREMITY VENOUS (DVT) Result Date: 05/05/2023  Lower Venous DVT Study Patient Name:  ZAHMARI MILKEY  Date of Exam:   05/05/2023 Medical Rec #: [id number]       Accession #:    [id number] Date of Birth: [birth year]       Patient Gender: M Patient Age:   2 years Exam Location:  [facility] Procedure:      VAS US  LOWER EXTREMITY VENOUS (DVT) Referring Phys: Lorella Roles GONFA --------------------------------------------------------------------------------  Indications: Elevated Ddimer.  Risk Factors: None identified. Limitations: Open wound. Comparison Study: No prior studies. Performing Technologist: Lerry Ransom RVT  Examination Guidelines: A complete evaluation includes B-mode imaging, spectral Doppler, color Doppler, and power Doppler as needed of all accessible portions of each vessel. Bilateral testing is considered an integral part of a complete examination. Limited examinations for reoccurring indications may be performed as noted. The reflux portion of the exam is performed with the patient in reverse Trendelenburg.   +---------+---------------+---------+-----------+----------+--------------+ RIGHT    CompressibilityPhasicitySpontaneityPropertiesThrombus Aging +---------+---------------+---------+-----------+----------+--------------+ CFV      Full           Yes      Yes                                 +---------+---------------+---------+-----------+----------+--------------+ SFJ      Full                                                        +---------+---------------+---------+-----------+----------+--------------+  FV Prox  Full                                                        +---------+---------------+---------+-----------+----------+--------------+ FV Mid   Full                                                        +---------+---------------+---------+-----------+----------+--------------+ FV DistalFull                                                        +---------+---------------+---------+-----------+----------+--------------+ PFV      Full                                                        +---------+---------------+---------+-----------+----------+--------------+ POP      Full           Yes      Yes                                 +---------+---------------+---------+-----------+----------+--------------+ PTV      Full                                                        +---------+---------------+---------+-----------+----------+--------------+ PERO     Full                                                        +---------+---------------+---------+-----------+----------+--------------+   +---------+---------------+---------+-----------+----------+--------------+ LEFT     CompressibilityPhasicitySpontaneityPropertiesThrombus Aging +---------+---------------+---------+-----------+----------+--------------+ CFV      Full           Yes      Yes                                  +---------+---------------+---------+-----------+----------+--------------+ SFJ      Full                                                        +---------+---------------+---------+-----------+----------+--------------+ FV Prox  Full                                                        +---------+---------------+---------+-----------+----------+--------------+  FV Mid   Full                                                        +---------+---------------+---------+-----------+----------+--------------+ FV DistalFull                                                        +---------+---------------+---------+-----------+----------+--------------+ PFV      Full                                                        +---------+---------------+---------+-----------+----------+--------------+ POP      Full           Yes      Yes                                 +---------+---------------+---------+-----------+----------+--------------+ PTV      Full                                                        +---------+---------------+---------+-----------+----------+--------------+ PERO     Full                                                        +---------+---------------+---------+-----------+----------+--------------+     Summary: RIGHT: - There is no evidence of deep vein thrombosis in the lower extremity. However, portions of this examination were limited- see technologist comments above.  - No cystic structure found in the popliteal fossa.  LEFT: - There is no evidence of deep vein thrombosis in the lower extremity. However, portions of this examination were limited- see technologist comments above.  - No cystic structure found in the popliteal fossa.  *See table(s) above for measurements and observations. Electronically signed by Angela Kell MD on 05/05/2023 at 1:38:32 PM.    Final    VAS US  ABI WITH/WO TBI Result Date: 05/05/2023  LOWER EXTREMITY  DOPPLER STUDY Patient Name:  KRISTOFER DUSENBERY  Date of Exam:   05/05/2023 Medical Rec #: [id number]       Accession #:    [id number] Date of Birth: Apr 05, 1966       Patient Gender: M Patient Age:   14 years Exam Location:  [facility] Procedure:      VAS US  ABI WITH/WO TBI Referring Phys: ELIZABETH REES --------------------------------------------------------------------------------  Indications: Rest pain, and ulceration. High Risk Factors: Hypertension, hyperlipidemia, Diabetes.  Limitations: Today's exam was limited due to an open wound and involuntary              patient movement. Comparison Study: No prior studies. Performing Technologist: Birda Buffy RVT  Examination Guidelines: A complete evaluation includes at minimum, Doppler waveform signals and systolic blood pressure reading at the level of bilateral brachial, anterior tibial, and posterior tibial arteries, when vessel segments are accessible. Bilateral testing is considered an integral part of a complete examination. Photoelectric Plethysmograph (PPG) waveforms and toe systolic pressure readings are included as required and additional duplex testing as needed. Limited examinations for reoccurring indications may be performed as noted.  ABI Findings: +---------+------------------+-----+-----------+--------+ Right    Rt Pressure (mmHg)IndexWaveform   Comment  +---------+------------------+-----+-----------+--------+ Brachial 95                     triphasic           +---------+------------------+-----+-----------+--------+ PTA      119               1.10 multiphasic         +---------+------------------+-----+-----------+--------+ DP       109               1.01 multiphasic         +---------+------------------+-----+-----------+--------+ Great Toe70                0.65                     +---------+------------------+-----+-----------+--------+ +---------+------------------+-----+-----------+-------+ Left     Lt  Pressure (mmHg)IndexWaveform   Comment +---------+------------------+-----+-----------+-------+ Brachial 108                    triphasic          +---------+------------------+-----+-----------+-------+ PTA      112               1.04 multiphasic        +---------+------------------+-----+-----------+-------+ DP       241               2.23 multiphasic        +---------+------------------+-----+-----------+-------+ Great Toe83                0.77                    +---------+------------------+-----+-----------+-------+ +-------+-----------+-----------+------------+------------+ ABI/TBIToday's ABIToday's TBIPrevious ABIPrevious TBI +-------+-----------+-----------+------------+------------+ Right  1.1        0.65                                +-------+-----------+-----------+------------+------------+ Left   Davison         0.77                                +-------+-----------+-----------+------------+------------+  Summary: Right: Resting right ankle-brachial index is within normal range. The right toe-brachial index is abnormal. Left: Resting left ankle-brachial index indicates noncompressible left lower extremity arteries. The left toe-brachial index is normal. *See table(s) above for measurements and observations.  Electronically signed by Angela Kell MD on 05/05/2023 at 1:38:24 PM.    Final    ECHOCARDIOGRAM COMPLETE Result Date: 05/05/2023    ECHOCARDIOGRAM REPORT   Patient Name:   FREE DIOP Date of Exam: 05/05/2023 Medical Rec #:  [id number]      Height:       77.0 in Accession #:    [id number]     Weight:       260.0 lb Date of Birth:  April 20, 1966  BSA:          2.501 m Patient Age:    57 years       BP:           106/79 mmHg Patient Gender: M              HR:           100 bpm. Exam Location:  Inpatient Procedure: 2D Echo, Cardiac Doppler, Color Doppler and Intracardiac            Opacification Agent Indications:    CHF  History:        Patient has  prior history of Echocardiogram examinations, most                 recent 08/23/2020. Cardiomyopathy, Previous Myocardial Infarction,                 Arrythmias:Atrial Fibrillation; Risk Factors:Diabetes,                 Hypertension and Sleep Apnea.  Sonographer:    Astrid Blamer Referring Phys: [id number] HAZEL V DUNCAN IMPRESSIONS  1. Left ventricular ejection fraction, by estimation, is 20%. The left ventricle has severely decreased function. The left ventricle demonstrates global hypokinesis. The left ventricular internal cavity size was mildly dilated. Left ventricular diastolic parameters are consistent with Grade III diastolic dysfunction (restrictive). There was a thrombus at the LV apex.  2. Right ventricular systolic function is moderately reduced. The right ventricular size is mildly enlarged. There is moderately elevated pulmonary artery systolic pressure. The estimated right ventricular systolic pressure is 59.6 mmHg.  3. Left atrial size was moderately dilated.  4. Right atrial size was mildly dilated.  5. The mitral valve is abnormal. Moderate mitral valve regurgitation. No evidence of mitral stenosis.  6. The aortic valve is tricuspid. There is mild calcification of the aortic valve. Aortic valve regurgitation is not visualized. No aortic stenosis is present.  7. The inferior vena cava is dilated in size with <50% respiratory variability, suggesting right atrial pressure of 15 mmHg. FINDINGS  Left Ventricle: Left ventricular ejection fraction, by estimation, is 20%. The left ventricle has severely decreased function. The left ventricle demonstrates global hypokinesis. The left ventricular internal cavity size was mildly dilated. There is no left ventricular hypertrophy. Left ventricular diastolic parameters are consistent with Grade III diastolic dysfunction (restrictive). Right Ventricle: The right ventricular size is mildly enlarged. No increase in right ventricular wall thickness. Right ventricular  systolic function is moderately reduced. There is moderately elevated pulmonary artery systolic pressure. The tricuspid regurgitant velocity is 3.34 m/s, and with an assumed right atrial pressure of 15 mmHg, the estimated right ventricular systolic pressure is 59.6 mmHg. Left Atrium: Left atrial size was moderately dilated. Right Atrium: Right atrial size was mildly dilated. Pericardium: Trivial pericardial effusion is present. Mitral Valve: The mitral valve is abnormal. Mild mitral annular calcification. Moderate mitral valve regurgitation. No evidence of mitral valve stenosis. Tricuspid Valve: The tricuspid valve is normal in structure. Tricuspid valve regurgitation is mild. Aortic Valve: The aortic valve is tricuspid. There is mild calcification of the aortic valve. Aortic valve regurgitation is not visualized. No aortic stenosis is present. Aortic valve mean gradient measures 3.0 mmHg. Aortic valve peak gradient measures 4.5 mmHg. Aortic valve area, by VTI measures 2.32 cm. Pulmonic Valve: The pulmonic valve was normal in structure. Pulmonic valve regurgitation is not visualized. Aorta: The aortic root is normal in size and structure. Venous: The inferior vena  cava is dilated in size with less than 50% respiratory variability, suggesting right atrial pressure of 15 mmHg. IAS/Shunts: No atrial level shunt detected by color flow Doppler.  LEFT VENTRICLE PLAX 2D LVIDd:         6.20 cm LVIDs:         5.80 cm LV PW:         0.90 cm LV IVS:        0.60 cm LVOT diam:     2.10 cm LV SV:         33 LV SV Index:   13 LVOT Area:     3.46 cm  RIGHT VENTRICLE RV S prime:     10.30 cm/s TAPSE (M-mode): 0.8 cm LEFT ATRIUM              Index        RIGHT ATRIUM           Index LA Vol (A2C):   155.0 ml 61.98 ml/m  RA Area:     22.10 cm LA Vol (A4C):   117.0 ml 46.79 ml/m  RA Volume:   71.30 ml  28.51 ml/m LA Biplane Vol: 135.0 ml 53.99 ml/m  AORTIC VALVE AV Area (Vmax):    2.05 cm AV Area (Vmean):   2.03 cm AV Area  (VTI):     2.32 cm AV Vmax:           106.00 cm/s AV Vmean:          75.700 cm/s AV VTI:            0.143 m AV Peak Grad:      4.5 mmHg AV Mean Grad:      3.0 mmHg LVOT Vmax:         62.70 cm/s LVOT Vmean:        44.400 cm/s LVOT VTI:          0.096 m LVOT/AV VTI ratio: 0.67  AORTA Ao Root diam: 3.20 cm MITRAL VALVE                TRICUSPID VALVE MV Area (PHT): 6.17 cm     TR Peak grad:   44.6 mmHg MV Decel Time: 123 msec     TR Vmax:        334.00 cm/s MV E velocity: 110.00 cm/s                             SHUNTS                             Systemic VTI:  0.10 m                             Systemic Diam: 2.10 cm Dalton McleanMD Electronically signed by Archer Bear Signature Date/Time: 05/05/2023/11:36:02 AM    Final    DG Tibia/Fibula Right Result Date: 05/04/2023 CLINICAL DATA:  Diabetic wounds.  Pain and redness. EXAM: RIGHT TIBIA AND FIBULA - 2 VIEW COMPARISON:  None Available. FINDINGS: There is no evidence of fracture or other focal bone lesions. No erosive change or periostitis. Degenerative change of the knee and ankle. Chondrocalcinosis of the knee. Areas of subcutaneous edema, no soft tissue gas or radiopaque foreign body. IMPRESSION: 1. Subcutaneous edema. No soft tissue gas or radiopaque foreign body. 2. No radiographic evidence of  osteomyelitis. 3. Degenerative change of the knee and ankle. Electronically Signed   By: Chadwick Colonel M.D.   On: 05/04/2023 21:03   DG Tibia/Fibula Left Result Date: 05/04/2023 CLINICAL DATA:  Bilateral lower extremity diabetic wounds EXAM: LEFT TIBIA AND FIBULA - 2 VIEW COMPARISON:  None Available. FINDINGS: Normal alignment. No acute fracture or dislocation. Degenerative changes are noted within the left knee and left ankle. No osseous erosions or abnormal periosteal reaction. Mild vascular calcification noted. IMPRESSION: 1. Degenerative changes. No radiographic evidence of osteomyelitis. Electronically Signed   By: Worthy Heads M.D.   On: 05/04/2023  21:02   (Echo, Carotid, EGD, Colonoscopy, ERCP)    Subjective: Patient seen in the morning rounds.  Denies any complaints.  Eager to go home.  Able to walk around without shortness of breath or dizziness or lightheadedness.   Discharge Exam: Vitals:   06/03/23 0800 06/03/23 1000  BP:  108/71  Pulse:  94  Resp:    Temp: 97.9 F (36.6 C)   SpO2:     Vitals:   06/03/23 0620 06/03/23 0757 06/03/23 0800 06/03/23 1000  BP: 110/85 (!) 101/90  108/71  Pulse: 99 83  94  Resp:  18    Temp: 97.8 F (36.6 C)  97.9 F (36.6 C)   TempSrc: Oral  Oral   SpO2: 97% 99%    Weight:      Height:        General: Pt is alert, awake, not in acute distress Cardiovascular: RRR, S1/S2 +, no rubs, no gallops Respiratory: CTA bilaterally, no wheezing, no rhonchi Abdominal: Soft, NT, ND, bowel sounds + Extremities: no edema, no cyanosis Multiple vasculitic ulcers all over his body.  Both legs on Unna boot.    The results of significant diagnostics from this hospitalization (including imaging, microbiology, ancillary and laboratory) are listed below for reference.     Microbiology: No results found for this or any previous visit (from the past 240 hours).   Labs: BNP (last 3 results) Recent Labs    04/18/23 2030 05/05/23 0125 05/26/23 1740  BNP 1,039.0* 1,045.5* 1,354.6*   Basic Metabolic Panel: Recent Labs  Lab 05/29/23 0255 05/29/23 1217 05/30/23 0415 05/31/23 0444 06/01/23 0717 06/02/23 0521 06/03/23 0330  NA  --    < > 131* 137 133* 137 132*  K  --    < > 3.1* 3.4* 3.3* 4.2 4.3  CL  --    < > 94* 95* 92* 97* 97*  CO2  --    < > 27 32 28 29 25   GLUCOSE  --    < > 187* 146* 192* 137* 146*  BUN  --    < > 22* 17 26* 23* 24*  CREATININE  --    < > 1.32* 1.29* 1.35* 1.17 1.30*  CALCIUM   --    < > 8.1* 8.6* 8.4* 8.7* 8.6*  MG 2.2  --  2.0 2.2 2.0 2.2  --   PHOS  --   --   --  3.7 3.8  --   --    < > = values in this interval not displayed.   Liver Function Tests: Recent  Labs  Lab 05/27/23 1949 05/28/23 2102  AST 71* 54*  ALT 94* 82*  ALKPHOS 88 99  BILITOT 1.0 0.9  PROT 6.5 6.6  ALBUMIN 3.0* 3.1*   No results for input(s): "LIPASE", "AMYLASE" in the last 168 hours. No results for input(s): "AMMONIA" in the last 168  hours. CBC: Recent Labs  Lab 05/30/23 0415 05/31/23 0444 06/01/23 0717  WBC 11.7* 10.3 10.0  NEUTROABS 9.3*  --   --   HGB 14.1 14.7 14.6  HCT 42.7 44.8 45.0  MCV 79.5* 79.7* 80.1  PLT 255 244 254   Cardiac Enzymes: No results for input(s): "CKTOTAL", "CKMB", "CKMBINDEX", "TROPONINI" in the last 168 hours. BNP: Invalid input(s): "POCBNP" CBG: Recent Labs  Lab 06/02/23 1126 06/02/23 1549 06/02/23 2003 06/02/23 2127 06/03/23 0625  GLUCAP 148* 182* 185* 127* 142*   D-Dimer No results for input(s): "DDIMER" in the last 72 hours. Hgb A1c No results for input(s): "HGBA1C" in the last 72 hours. Lipid Profile No results for input(s): "CHOL", "HDL", "LDLCALC", "TRIG", "CHOLHDL", "LDLDIRECT" in the last 72 hours. Thyroid function studies No results for input(s): "TSH", "T4TOTAL", "T3FREE", "THYROIDAB" in the last 72 hours.  Invalid input(s): "FREET3" Anemia work up No results for input(s): "VITAMINB12", "FOLATE", "FERRITIN", "TIBC", "IRON", "RETICCTPCT" in the last 72 hours. Urinalysis    Component Value Date/Time   COLORURINE YELLOW 04/19/2023 0840   APPEARANCEUR CLEAR 04/19/2023 0840   APPEARANCEUR Clear 10/20/2022 1623   LABSPEC >1.046 (H) 04/19/2023 0840   PHURINE 5.0 04/19/2023 0840   GLUCOSEU >=500 (A) 04/19/2023 0840   HGBUR SMALL (A) 04/19/2023 0840   BILIRUBINUR NEGATIVE 04/19/2023 0840   BILIRUBINUR Negative 10/20/2022 1623   KETONESUR 5 (A) 04/19/2023 0840   PROTEINUR 30 (A) 04/19/2023 0840   NITRITE NEGATIVE 04/19/2023 0840   LEUKOCYTESUR NEGATIVE 04/19/2023 0840   Sepsis Labs Recent Labs  Lab 05/30/23 0415 05/31/23 0444 06/01/23 0717  WBC 11.7* 10.3 10.0   Microbiology No results found for  this or any previous visit (from the past 240 hours).   Time coordinating discharge: 40 minutes  SIGNED:   Vada Garibaldi, MD  [facility] 06/03/2023, 10:54 AM

## 2023-06-04 DIAGNOSIS — L84 Corns and callosities: Secondary | ICD-10-CM | POA: Diagnosis not present

## 2023-06-04 DIAGNOSIS — I87333 Chronic venous hypertension (idiopathic) with ulcer and inflammation of bilateral lower extremity: Secondary | ICD-10-CM | POA: Diagnosis not present

## 2023-06-04 DIAGNOSIS — E11628 Type 2 diabetes mellitus with other skin complications: Secondary | ICD-10-CM | POA: Diagnosis not present

## 2023-06-04 DIAGNOSIS — M1990 Unspecified osteoarthritis, unspecified site: Secondary | ICD-10-CM | POA: Diagnosis not present

## 2023-06-04 DIAGNOSIS — Z79899 Other long term (current) drug therapy: Secondary | ICD-10-CM | POA: Diagnosis not present

## 2023-06-04 DIAGNOSIS — I255 Ischemic cardiomyopathy: Secondary | ICD-10-CM | POA: Diagnosis not present

## 2023-06-04 DIAGNOSIS — I252 Old myocardial infarction: Secondary | ICD-10-CM | POA: Diagnosis not present

## 2023-06-04 DIAGNOSIS — I110 Hypertensive heart disease with heart failure: Secondary | ICD-10-CM | POA: Diagnosis not present

## 2023-06-04 DIAGNOSIS — I272 Pulmonary hypertension, unspecified: Secondary | ICD-10-CM | POA: Diagnosis not present

## 2023-06-04 DIAGNOSIS — I2510 Atherosclerotic heart disease of native coronary artery without angina pectoris: Secondary | ICD-10-CM | POA: Diagnosis not present

## 2023-06-04 DIAGNOSIS — L97821 Non-pressure chronic ulcer of other part of left lower leg limited to breakdown of skin: Secondary | ICD-10-CM | POA: Diagnosis not present

## 2023-06-04 DIAGNOSIS — G4733 Obstructive sleep apnea (adult) (pediatric): Secondary | ICD-10-CM | POA: Diagnosis not present

## 2023-06-04 DIAGNOSIS — K219 Gastro-esophageal reflux disease without esophagitis: Secondary | ICD-10-CM | POA: Diagnosis not present

## 2023-06-04 DIAGNOSIS — I5042 Chronic combined systolic (congestive) and diastolic (congestive) heart failure: Secondary | ICD-10-CM | POA: Diagnosis not present

## 2023-06-04 DIAGNOSIS — L97811 Non-pressure chronic ulcer of other part of right lower leg limited to breakdown of skin: Secondary | ICD-10-CM | POA: Diagnosis not present

## 2023-06-04 DIAGNOSIS — I776 Arteritis, unspecified: Secondary | ICD-10-CM | POA: Diagnosis not present

## 2023-06-04 DIAGNOSIS — I5082 Biventricular heart failure: Secondary | ICD-10-CM | POA: Diagnosis not present

## 2023-06-04 DIAGNOSIS — I4891 Unspecified atrial fibrillation: Secondary | ICD-10-CM | POA: Diagnosis not present

## 2023-06-04 DIAGNOSIS — I4729 Other ventricular tachycardia: Secondary | ICD-10-CM | POA: Diagnosis not present

## 2023-06-04 DIAGNOSIS — I890 Lymphedema, not elsewhere classified: Secondary | ICD-10-CM | POA: Diagnosis not present

## 2023-06-04 DIAGNOSIS — E782 Mixed hyperlipidemia: Secondary | ICD-10-CM | POA: Diagnosis not present

## 2023-06-04 DIAGNOSIS — M109 Gout, unspecified: Secondary | ICD-10-CM | POA: Diagnosis not present

## 2023-06-04 DIAGNOSIS — Z7982 Long term (current) use of aspirin: Secondary | ICD-10-CM | POA: Diagnosis not present

## 2023-06-05 ENCOUNTER — Telehealth: Payer: Self-pay

## 2023-06-05 NOTE — Telephone Encounter (Signed)
TC to Evan [name] w/ [facility] PCP will sign orders for PT/OT and nursing, pt has OV next week.

## 2023-06-05 NOTE — Telephone Encounter (Signed)
Copied from CRM [id number]. Topic: Clinical - Home Health Verbal Orders >> Jun 05, 2023  1:50 PM Desma Mcgregor wrote: Caller/Agency: [facility] Callback Number: [phone] Service Requested: Occupational and Physical Therapy [place], Skilled Nursing Frequency: Weekly for nursing Any new concerns about the patient? Yes. CHF exacerbation and venous ulcers

## 2023-06-08 ENCOUNTER — Encounter (HOSPITAL_COMMUNITY): Payer: Medicaid Other

## 2023-06-08 ENCOUNTER — Ambulatory Visit (HOSPITAL_COMMUNITY): Admission: RE | Admit: 2023-06-08 | Payer: Medicaid Other | Source: Ambulatory Visit

## 2023-06-08 ENCOUNTER — Encounter (HOSPITAL_COMMUNITY): Payer: Self-pay

## 2023-06-08 DIAGNOSIS — M9904 Segmental and somatic dysfunction of sacral region: Secondary | ICD-10-CM | POA: Diagnosis not present

## 2023-06-08 DIAGNOSIS — M9903 Segmental and somatic dysfunction of lumbar region: Secondary | ICD-10-CM | POA: Diagnosis not present

## 2023-06-08 DIAGNOSIS — M9905 Segmental and somatic dysfunction of pelvic region: Secondary | ICD-10-CM | POA: Diagnosis not present

## 2023-06-08 DIAGNOSIS — M5386 Other specified dorsopathies, lumbar region: Secondary | ICD-10-CM | POA: Diagnosis not present

## 2023-06-09 ENCOUNTER — Ambulatory Visit (INDEPENDENT_AMBULATORY_CARE_PROVIDER_SITE_OTHER): Payer: Medicaid Other | Admitting: Orthopedic Surgery

## 2023-06-09 ENCOUNTER — Encounter: Payer: Self-pay | Admitting: Orthopedic Surgery

## 2023-06-09 DIAGNOSIS — I87333 Chronic venous hypertension (idiopathic) with ulcer and inflammation of bilateral lower extremity: Secondary | ICD-10-CM

## 2023-06-09 DIAGNOSIS — I5042 Chronic combined systolic (congestive) and diastolic (congestive) heart failure: Secondary | ICD-10-CM | POA: Diagnosis not present

## 2023-06-09 NOTE — Progress Notes (Signed)
Office Visit Note   Patient: Evan [name]           Date of Birth: 1965/08/27           MRN: [id number] Visit Date: 06/09/2023              Requested by: Dettinger, Elige Radon, MD [address] [place],  [place] [address] PCP: Dettinger, Elige Radon, MD  Chief Complaint  Patient presents with   Left Leg - Follow-up   Right Leg - Follow-up      HPI: Patient is seen in follow-up for venous insufficiency ulcers both lower extremities.  Patient was recently hospitalized for his congestive heart failure and states that he lost 44 pounds of fluid.  He is currently on Lasix 40 mg twice a day.  Assessment & Plan: Visit Diagnoses:  1. 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 Chronic combined systolic and diastolic congestive heart failure (HCC)     Plan: Will apply Vashe dressings and 3 layer compression wraps to both lower extremities follow-up on Friday to repeat compression wraps.  Patient may be a candidate for venous ulcer study.  Follow-Up Instructions: Return in about 1 week (around 06/16/2023).   Ortho Exam  Patient is alert, oriented, no adenopathy, well-dressed, normal affect, normal respiratory effort. Examination patient has decreased swelling in both legs he has massive venous stasis ulcers on both legs.  He has a palpable dorsalis pedis pulse no evidence of arterial insufficiency.  There is pitting edema.  There is no cellulitis.  Imaging: No results found. No images are attached to the encounter.  Labs: Lab Results  Component Value Date   HGBA1C 7.3 (H) 04/19/2023   HGBA1C 7.4 (H) 02/11/2023   HGBA1C 7.6 (H) 11/10/2022   ESRSEDRATE 3 05/05/2023   ESRSEDRATE 1 05/05/2023   CRP 2.0 (H) 05/05/2023   CRP 2.5 (H) 05/05/2023   LABURIC 7.1 10/12/2019   LABURIC 6.1 11/24/2018   LABURIC 5.9 02/04/2017   REPTSTATUS 05/10/2023 FINAL 05/05/2023   CULT  05/05/2023    NO GROWTH 5 DAYS Performed at [facility], [address] [place], [place] [address]      Lab Results  Component Value Date   ALBUMIN 3.1 (L) 05/28/2023   ALBUMIN 3.0 (L) 05/27/2023   ALBUMIN 3.8 05/21/2023   PREALBUMIN 12 (L) 05/05/2023    Lab Results  Component Value Date   MG 2.2 06/02/2023   MG 2.0 06/01/2023   MG 2.2 05/31/2023   No results found for: "VD25OH"  Lab Results  Component Value Date   PREALBUMIN 12 (L) 05/05/2023      Latest Ref Rng & Units 06/01/2023    7:17 AM 05/31/2023    4:44 AM 05/30/2023    4:15 AM  CBC EXTENDED  WBC 4.0 - 10.5 K/uL 10.0  10.3  11.7   RBC 4.22 - 5.81 MIL/uL 5.62  5.62  5.37   Hemoglobin 13.0 - 17.0 g/dL 29.5  28.4  13.2   HCT 39.0 - 52.0 % 45.0  44.8  42.7   Platelets 150 - 400 K/uL 254  244  255   NEUT# 1.7 - 7.7 K/uL   9.3   Lymph# 0.7 - 4.0 K/uL   1.1      There is no height or weight on file to calculate BMI.  Orders:  No orders of the defined types were placed in this encounter.  No  orders of the defined types were placed in this encounter.    Procedures: No procedures performed  Clinical Data: No additional findings.  ROS:  All other systems negative, except as noted in the HPI. Review of Systems  Objective: Vital Signs: There were no vitals taken for this visit.  Specialty Comments:  No specialty comments available.  PMFS History: Patient Active Problem List   Diagnosis Date Noted   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) 05/28/2023   Lymphedema 05/28/2023   Nonsustained ventricular tachycardia (HCC) 05/28/2023   History of coronary artery stent placement 05/28/2023   Left ventricular thrombus 05/28/2023   Pulmonary hypertension, unspecified (HCC) 05/28/2023   OSA (obstructive sleep apnea) 05/28/2023   Cocaine use 05/28/2023   Atherosclerosis of native coronary artery of native heart without angina pectoris 05/28/2023   Biventricular heart failure (HCC) 05/28/2023    Acute decompensated heart failure (HCC) 05/28/2023   Acute HFrEF (heart failure with reduced ejection fraction) (HCC) 05/27/2023   Diabetic foot ulcer (HCC) 05/05/2023   History of acute cholecystitis 04/18/23 treated conservatively 05/05/2023   Acute on chronic HFrEF (heart failure with reduced ejection fraction) (HCC) 05/05/2023   Diabetic ulcer of lower leg (HCC) 05/05/2023   Cellulitis in diabetic foot (HCC) 05/05/2023   CHF (congestive heart failure) (HCC) 05/05/2023   Bilateral leg ulcer (HCC) 05/05/2023   Acute on chronic combined systolic and diastolic CHF (congestive heart failure) (HCC) 05/05/2023   Vasculitis (HCC) 05/05/2023   Abdominal pain 04/19/2023   Elevated brain natriuretic peptide (BNP) level 04/19/2023   Elevated troponin 04/19/2023   Elevated d-dimer 04/19/2023   Type 2 diabetes mellitus with hyperglycemia (HCC) 04/19/2023   Acute cholecystitis 04/18/2023   Hypertension 10/06/2022   Type 2 diabetes mellitus with complications (HCC) 02/15/2021   CAD S/P PCI 12/09/2018   Ischemic cardiomyopathy 12/09/2018   Left ventricular dysfunction    History of non-ST elevation myocardial infarction (NSTEMI) 11/24/2018   Obesity (BMI 30.0-34.9) 01/05/2018   GERD (gastroesophageal reflux disease) 07/08/2017   Mixed hyperlipidemia 12/22/2016   Essential hypertension 09/22/2016   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) 09/22/2016   Gout 09/22/2016   Past Medical History:  Diagnosis Date   Arthritis    Atrial fibrillation (HCC)    CHF (congestive heart failure) (HCC)    Diabetes mellitus without complication (HCC)    Gout    Heart attack (HCC) 10/2018   Hyperlipidemia    Hypertension    Morbid obesity (HCC) 12/22/2016   Sleep apnea    had test twice unable to complete    Family History  Problem Relation Age of Onset   Diabetes Mother    Stroke Mother    Early death Father    Heart attack Father        died from heart attack at the age of 53    Alcohol abuse Father    Heart disease Father    Breast cancer Sister    Hypertension Brother    Heart attack Maternal Grandfather        died from heart attack at the age of 25   Early death Paternal Grandfather    Heart attack Paternal Grandfather    Colon cancer Neg Hx    Esophageal cancer Neg Hx    Rectal cancer Neg Hx    Stomach cancer Neg Hx     Past Surgical History:  Procedure Laterality Date   ANKLE ARTHROSCOPY Right  COLONOSCOPY WITH PROPOFOL N/A 09/18/2020   Procedure: COLONOSCOPY WITH PROPOFOL;  Surgeon: Sherrilyn Rist, MD;  Location: [facility];  Service: Gastroenterology;  Laterality: N/A;  09-18-2020   CORONARY STENT INTERVENTION N/A 11/24/2018   Procedure: CORONARY STENT INTERVENTION;  Surgeon: Iran Ouch, MD;  Location: [facility];  Service: Cardiovascular;  Laterality: N/A;  prox and mid LAD   CORONARY STENT INTERVENTION N/A 11/26/2018   Procedure: CORONARY STENT INTERVENTION;  Surgeon: Iran Ouch, MD;  Location: [facility];  Service: Cardiovascular;  Laterality: N/A;   CYST REMOVAL HAND     KNEE ARTHROSCOPY Left    LEFT HEART CATH AND CORONARY ANGIOGRAPHY N/A 11/24/2018   Procedure: LEFT HEART CATH AND CORONARY ANGIOGRAPHY;  Surgeon: Iran Ouch, MD;  Location: [facility];  Service: Cardiovascular;  Laterality: N/A;   POLYPECTOMY  09/18/2020   Procedure: POLYPECTOMY;  Surgeon: Sherrilyn Rist, MD;  Location: [facility];  Service: Gastroenterology;;   Social History   Occupational History   Occupation: Disability  Tobacco Use   Smoking status: Never   Smokeless tobacco: Never  Vaping Use   Vaping status: Never Used  Substance and Sexual Activity   Alcohol use: Not Currently    Alcohol/week: 12.0 standard drinks of alcohol    Types: 12 Cans of beer per week   Drug use: No   Sexual activity: Yes    Comment: male 8 months partner

## 2023-06-10 ENCOUNTER — Encounter (HOSPITAL_COMMUNITY): Payer: Self-pay | Admitting: Cardiology

## 2023-06-10 ENCOUNTER — Ambulatory Visit (HOSPITAL_COMMUNITY)
Admission: RE | Admit: 2023-06-10 | Discharge: 2023-06-10 | Disposition: A | Discharge status: Home / Self Care | Payer: Medicaid Other | Source: Ambulatory Visit | Attending: Cardiology | Admitting: Cardiology

## 2023-06-10 VITALS — BP 136/84 | HR 86 | Ht 77.0 in | Wt 252.6 lb

## 2023-06-10 DIAGNOSIS — Z955 Presence of coronary angioplasty implant and graft: Secondary | ICD-10-CM | POA: Insufficient documentation

## 2023-06-10 DIAGNOSIS — Z7982 Long term (current) use of aspirin: Secondary | ICD-10-CM | POA: Insufficient documentation

## 2023-06-10 DIAGNOSIS — Z79899 Other long term (current) drug therapy: Secondary | ICD-10-CM | POA: Insufficient documentation

## 2023-06-10 DIAGNOSIS — E118 Type 2 diabetes mellitus with unspecified complications: Secondary | ICD-10-CM | POA: Diagnosis not present

## 2023-06-10 DIAGNOSIS — I2510 Atherosclerotic heart disease of native coronary artery without angina pectoris: Secondary | ICD-10-CM | POA: Diagnosis not present

## 2023-06-10 DIAGNOSIS — I5020 Unspecified systolic (congestive) heart failure: Secondary | ICD-10-CM

## 2023-06-10 DIAGNOSIS — I5082 Biventricular heart failure: Secondary | ICD-10-CM | POA: Diagnosis not present

## 2023-06-10 DIAGNOSIS — I5022 Chronic systolic (congestive) heart failure: Secondary | ICD-10-CM | POA: Insufficient documentation

## 2023-06-10 DIAGNOSIS — I110 Hypertensive heart disease with heart failure: Secondary | ICD-10-CM | POA: Insufficient documentation

## 2023-06-10 DIAGNOSIS — F1910 Other psychoactive substance abuse, uncomplicated: Secondary | ICD-10-CM

## 2023-06-10 DIAGNOSIS — L97929 Non-pressure chronic ulcer of unspecified part of left lower leg with unspecified severity: Secondary | ICD-10-CM | POA: Diagnosis not present

## 2023-06-10 DIAGNOSIS — I83019 Varicose veins of right lower extremity with ulcer of unspecified site: Secondary | ICD-10-CM | POA: Diagnosis not present

## 2023-06-10 DIAGNOSIS — I428 Other cardiomyopathies: Secondary | ICD-10-CM | POA: Diagnosis not present

## 2023-06-10 DIAGNOSIS — Z7901 Long term (current) use of anticoagulants: Secondary | ICD-10-CM | POA: Insufficient documentation

## 2023-06-10 DIAGNOSIS — I25118 Atherosclerotic heart disease of native coronary artery with other forms of angina pectoris: Secondary | ICD-10-CM | POA: Diagnosis not present

## 2023-06-10 DIAGNOSIS — I83029 Varicose veins of left lower extremity with ulcer of unspecified site: Secondary | ICD-10-CM

## 2023-06-10 DIAGNOSIS — L97919 Non-pressure chronic ulcer of unspecified part of right lower leg with unspecified severity: Secondary | ICD-10-CM

## 2023-06-10 DIAGNOSIS — I82409 Acute embolism and thrombosis of unspecified deep veins of unspecified lower extremity: Secondary | ICD-10-CM | POA: Diagnosis not present

## 2023-06-10 LAB — COMPREHENSIVE METABOLIC PANEL
ALT: 20 U/L (ref 0–44)
AST: 24 U/L (ref 15–41)
Albumin: 2.9 g/dL — ABNORMAL LOW (ref 3.5–5.0)
Alkaline Phosphatase: 75 U/L (ref 38–126)
Anion gap: 8 (ref 5–15)
BUN: 32 mg/dL — ABNORMAL HIGH (ref 6–20)
CO2: 27 mmol/L (ref 22–32)
Calcium: 8.7 mg/dL — ABNORMAL LOW (ref 8.9–10.3)
Chloride: 101 mmol/L (ref 98–111)
Creatinine, Ser: 1.57 mg/dL — ABNORMAL HIGH (ref 0.61–1.24)
GFR, Estimated: 51 mL/min — ABNORMAL LOW (ref >60)
Glucose, Bld: 147 mg/dL — ABNORMAL HIGH (ref 70–99)
Potassium: 4 mmol/L (ref 3.5–5.1)
Sodium: 136 mmol/L (ref 135–145)
Total Bilirubin: 0.9 mg/dL (ref 0.0–1.2)
Total Protein: 6.4 g/dL — ABNORMAL LOW (ref 6.5–8.1)

## 2023-06-10 LAB — DIGOXIN LEVEL: Digoxin Level: 0.6 ng/mL — ABNORMAL LOW (ref 0.8–2.0)

## 2023-06-10 LAB — BRAIN NATRIURETIC PEPTIDE: B Natriuretic Peptide: 1738.4 pg/mL — ABNORMAL HIGH (ref 0.0–100.0)

## 2023-06-10 NOTE — Progress Notes (Signed)
ADVANCED HEART FAILURE CLINIC NOTE  Referring Physician: Dettinger, Elige Radon, MD  Primary Care: Dettinger, Elige Radon, MD   CC: HFrEF  HPI: Evan [name] is a 58 y.o. 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  Evan [name] was admitted to [facility] in January 2025 after presenting with severe volume overload and leaving AMA from his prior hospitalization.  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  Echocardiogram at that time in December with EF of 20% and LV apical thrombus.  During this time he was also using cocaine.  During his hospitalization he was started on milrinone and required midodrine to maintain normal systolic blood pressure.  He was aggressively diuresed and discharged home on digoxin, Jardiance, midodrine and torsemide.  Interval history Since discharge, he has been compliant with medications but reports a 10lb weight gain. He has been taking torsemide 20mg  twice daily rather than 40mg  once in the morning. He also reports feeling increasingly fatigued. Despite all this, he feels much better than he did prior to recent admission at [facility].   Activity level/exercise tolerance:  NYHA III Orthopnea:  Sleeps on 2-3 pillows Paroxysmal noctural dyspnea:  No Chest pain/pressure:  No Orthostatic lightheadedness:  No Palpitations:  No Lower extremity edema:  Yes Presyncope/syncope:  No Cough:  No  Past Medical History:  Diagnosis Date   Arthritis    Atrial fibrillation (HCC)    CHF (congestive heart failure) (HCC)    Diabetes mellitus without complication (HCC)    Gout     Heart attack (HCC) 10/2018   Hyperlipidemia    Hypertension    Morbid obesity (HCC) 12/22/2016   Sleep apnea    had test twice unable to complete    Current Outpatient Medications  Medication Sig Dispense Refill   acetaminophen (TYLENOL) 325 MG tablet Take 2 tablets (650 mg total) by mouth every 6 (six) hours as needed for mild pain (pain score 1-3) or fever (or Fever >/= 101).     apixaban (ELIQUIS) 5 MG TABS tablet Take 1 tablet (5 mg total) by mouth 2 (two) times daily. 60 tablet 2   aspirin EC 81 MG tablet Take 1 tablet (81 mg total) by mouth daily with breakfast. Swallow whole. 30 tablet 12   atorvastatin (LIPITOR) 80 MG tablet Take 1 tablet (80 mg total) by mouth daily. 90 tablet 3   BD PEN NEEDLE NANO 2ND GEN 32G X 4 MM MISC Use daily with insulin Dx E11.9, Z79.4 100 each 3   Blood Glucose Monitoring Suppl (ACCU-CHEK AVIVA PLUS) w/Device KIT 1 each by Does not apply route 4 (four) times daily. 1 kit 1   Blood Glucose Monitoring Suppl (ACCU-CHEK AVIVA) device Test BS BID Dx E11.69 1 each 0   collagenase (SANTYL) 250 UNIT/GM ointment Apply 1 Application topically daily. 90 g 1   digoxin (LANOXIN) 0.125 MG tablet Take 1 tablet (0.125 mg total) by mouth daily. 30 tablet 2   empagliflozin (JARDIANCE) 25 MG TABS tablet Take 1 tablet (25 mg total) by mouth daily. 90 tablet 3   famotidine (PEPCID) 20 MG tablet Take 1 tablet (20 mg total) by mouth 2 (two) times daily. 180 tablet 3  fenofibrate (TRICOR) 145 MG tablet Take 1 tablet (145 mg total) by mouth daily. 90 tablet 3   fluticasone (FLONASE) 50 MCG/ACT nasal spray Place 1 spray into both nostrils 2 (two) times daily as needed for allergies or rhinitis. 16 g 6   glucose blood (ACCU-CHEK GUIDE) test strip Check BS in the morning and at bedtime Dx E11.8 200 strip 3   hydrOXYzine (VISTARIL) 25 MG capsule Take 1 capsule (25 mg total) by mouth every 8 (eight) hours as needed. 30 capsule 2   midodrine (PROAMATINE) 2.5 MG tablet Take 1 tablet (2.5  mg total) by mouth 3 (three) times daily with meals. 90 tablet 2   pantoprazole (PROTONIX) 40 MG tablet Take 40 mg by mouth daily.     potassium chloride SA (KLOR-CON M) 20 MEQ tablet Take 1 tablet (20 mEq total) by mouth daily. 30 tablet 0   silver nitrate applicators 75-25 % applicator Apply topically 3 (three) times a week. 100 each 0   silver sulfADIAZINE (SILVADENE) 1 % cream Apply 1 Application topically daily. 400 g 1   spironolactone (ALDACTONE) 25 MG tablet Take 0.5 tablets (12.5 mg total) by mouth daily. 45 tablet 3   torsemide (DEMADEX) 20 MG tablet Take 2 tablets (40 mg total) by mouth daily. 60 tablet 2   No current facility-administered medications for this visit.    Allergies  Allergen Reactions   Metoprolol     Hypotension       Social History   Socioeconomic History   Marital status: Divorced    Spouse name: Not on file   Number of children: 2   Years of education: Not on file   Highest education level: High school graduate  Occupational History   Occupation: Disability  Tobacco Use   Smoking status: Never   Smokeless tobacco: Never  Vaping Use   Vaping status: Never Used  Substance and Sexual Activity   Alcohol use: Not Currently    Alcohol/week: 12.0 standard drinks of alcohol    Types: 12 Cans of beer per week   Drug use: No   Sexual activity: Yes    Comment: male 8 months partner  Other Topics Concern   Not on file  Social History Narrative   Not on file   Social Drivers of Health   Financial Resource Strain: Low Risk  (05/28/2023)   Overall Financial Resource Strain (CARDIA)    Difficulty of Paying Living Expenses: Not hard at all  Food Insecurity: No Food Insecurity (05/28/2023)   Hunger Vital Sign    Worried About Running Out of Food in the Last Year: Never true    Ran Out of Food in the Last Year: Never true  Transportation Needs: No Transportation Needs (05/28/2023)   PRAPARE - Administrator, Civil Service (Medical): No     Lack of Transportation (Non-Medical): No  Physical Activity: Not on file  Stress: Not on file  Social Connections: Not on file  Intimate Partner Violence: Not At Risk (05/28/2023)   Humiliation, Afraid, Rape, and Kick questionnaire    Fear of Current or Ex-Partner: No    Emotionally Abused: No    Physically Abused: No    Sexually Abused: No      Family History  Problem Relation Age of Onset   Diabetes Mother    Stroke Mother    Early death Father    Heart attack Father        died from heart attack at the age  of 55   Alcohol abuse Father    Heart disease Father    Breast cancer Sister    Hypertension Brother    Heart attack Maternal Grandfather        died from heart attack at the age of 24   Early death Paternal Grandfather    Heart attack Paternal Grandfather    Colon cancer Neg Hx    Esophageal cancer Neg Hx    Rectal cancer Neg Hx    Stomach cancer Neg Hx     PHYSICAL EXAM: There were no vitals filed for this visit. GENERAL: Well nourished, well developed, and in no apparent distress at rest.  HEENT: There is no scleral icterus.  The mucous membranes are pink and moist.   CHEST: There are no chest wall deformities. There is no chest wall tenderness. Respirations are unlabored.  Lungs- mild basilar crackles CARDIAC:  JVP: 8-10 cm          Normal rate with regular rhythm. No murmur.  Pulses are 2+ and symmetrical in upper and lower extremities. +1 edema.  ABDOMEN: Soft, non-tender, non-distended. There are normal bowel sounds.  EXTREMITIES: Warm and well perfused.  NEUROLOGIC: Patient is oriented x3 with no obvious focal neurologic deficits.  PSYCH: Patients affect is appropriate SKIN: Warm and dry; no lesions or wounds.   Filed Weights   06/10/23 1515  Weight: 114.6 kg (252 lb 9.6 oz)     DATA REVIEW  ECG: 05/28/23: NSR with old anterior infarct  As per my personal interpretation  ECHO: 05/05/23: LVEF 20%, moderately reduced RV function as per my personal  interpretation 4/22: LVEF 35%-40%, normal RV function  CATH: 11/24/18:  1.  Severe three-vessel coronary artery disease.  Occluded ostial and mid LAD likely due to late presenting anterior STEMI.  Significant distal left circumflex disease but the supplied territory is small.  Significant mid RCA stenosis with occluded posterior AV groove with left-to-right collaterals. 2.  Severely reduced LV systolic function with an EF of 20 to 25% with anterior wall akinesis.  LVEDP was 32 mmHg. 3.  Successful PCI and drug-eluting stent placement to the ostial as well as mid LAD.   ASSESSMENT & PLAN:  Heart failure with reduced EF Etiology of AO:ZHYQM ischemic & nonsichemic cardiomyopathy; likely exacerbated by polysubstance abuse.  NYHA class / AHA Stage:NYHA II-III Volume status & Diuretics: He is hypervolemic on exam today; only taking torsemide 20 twice daily rather than 40mg  daily. We will increase torsemide to 60mg  x4 days and then 40mg  daily.  Vasodilators:Midodrine 2.5mg  TID; BP 136 systolic today. Will discontinue.  Beta-Blocker:holding due to low output heart failure; dig , will check levels today VHQ:[id number] 25mg  daily Cardiometabolic:jardiance 25mg  daily Devices therapies & Valvulopathies:not currently indicated Advanced therapies:not a candidate currently.   2. CAD - s/p PCI in 11/24/18 to the LAD & RCA with anterior wall hypokinesis.  - continue asa 81mg  daily & lipitor 80mg  daily   3. LV thrombus - continue apixaban 5mg  BID  4. Polysubstance abuse - discussed importance of drug cessation; so far he reports doing well with this.   5. Chronic venous insufficency with LE ulcers - Followed by Dr. Lajoyce Corners; last seen on 06/09/23 for massive venous stasis ulcers on both legs.  - Vashe dressings and compression wraps applied with plan for follow up later this week.    ReDs reading: 35 %, midly elevated, increase torsemide.    Shamal Stracener Advanced Heart  Failure Mechanical Circulatory Support

## 2023-06-10 NOTE — Patient Instructions (Addendum)
Medication Changes:  INCREASE TORSEMIDE TO 60MG  DAILY FOR 3 DAYS THEN RETURN TO 40MG    STOP TAKING MIDODRINE   Lab Work:  Labs done today, your results will be available in MyChart, we will contact you for abnormal readings.  Follow-Up in: 1 WEEK WITH DR. Gasper Lloyd AT [facility] AS SCHEDULED   At the Advanced [facility] Failure Clinic, you and your health needs are our priority. We have a designated team specialized in the treatment of Heart Failure. This Care Team includes your primary Heart Failure Specialized Cardiologist (physician), Advanced Practice Providers (APPs- Physician Assistants and Nurse Practitioners), and Pharmacist who all work together to provide you with the care you need, when you need it.   You may see any of the following providers on your designated Care Team at your next follow up:  Dr. Arvilla Meres Dr. Marca Ancona Dr. Dorthula Nettles Dr. Theresia Bough Tonye Becket, NP Robbie Lis, [facility] [place], [place] Brynda Peon, NP [place] Lee, NP Karle Plumber, PharmD   Please be sure to bring in all your medications bottles to every appointment.   Need to Contact us:  If you have any questions or concerns before your next appointment please send [place] a message through [place] or call our office at [phone].    TO LEAVE A MESSAGE FOR THE NURSE SELECT OPTION 2, PLEASE LEAVE A MESSAGE INCLUDING: YOUR NAME DATE OF BIRTH CALL BACK NUMBER REASON FOR CALL**this is important as we prioritize the call backs  YOU WILL RECEIVE A CALL BACK THE SAME DAY AS LONG AS YOU CALL BEFORE 4:00 PM

## 2023-06-10 NOTE — Progress Notes (Signed)
ReDS Vest / Clip - 06/10/23 1515       ReDS Vest / Clip   Station Marker D    Ruler Value 33.5    ReDS Value Range Low volume    ReDS Actual Value 35

## 2023-06-11 ENCOUNTER — Ambulatory Visit: Payer: Medicaid Other | Admitting: Family Medicine

## 2023-06-11 ENCOUNTER — Encounter: Payer: Self-pay | Admitting: Family Medicine

## 2023-06-11 ENCOUNTER — Encounter (HOSPITAL_COMMUNITY): Payer: Medicaid Other | Admitting: Cardiology

## 2023-06-11 VITALS — BP 106/78 | Ht 77.0 in | Wt 251.0 lb

## 2023-06-11 DIAGNOSIS — I5043 Acute on chronic combined systolic (congestive) and diastolic (congestive) heart failure: Secondary | ICD-10-CM

## 2023-06-11 DIAGNOSIS — I776 Arteritis, unspecified: Secondary | ICD-10-CM

## 2023-06-11 DIAGNOSIS — E11622 Type 2 diabetes mellitus with other skin ulcer: Secondary | ICD-10-CM | POA: Diagnosis not present

## 2023-06-11 DIAGNOSIS — L97909 Non-pressure chronic ulcer of unspecified part of unspecified lower leg with unspecified severity: Secondary | ICD-10-CM | POA: Diagnosis not present

## 2023-06-11 NOTE — Progress Notes (Signed)
BP 106/78   Ht 6\' 5"  (1.956 m)   Wt 251 lb (113.9 kg)   SpO2 91%   BMI 29.76 kg/m    Subjective:   Patient ID: Evan [name], male    DOB: [birth year], 58 y.o.   MRN: [id number]  HPI: Evan [name] is a 58 y.o. male presenting on 06/11/2023 for Diabetic leg ulcer (BLE)   HPI Hospital follow-up for CHF Patient was in the emergency department and hospital from 05/26/2023 till 06/03/2023.  He was diagnosed with CHF exacerbation for having gained more than 40 pounds of weight that was fluid.  It looks like patient had follow-up with Dr. Chinita Greenland for the CHF yesterday.  Patient did see cardiology yesterday and is going to see them next week and they adjusted his diuretics based on him being slightly more up and weight.  He is 251 today and says that is about 8 or 9 pounds up from when he left the hospital.  He was 277 before he went into the hospital the last time.  Patient says his breathing is doing okay and much better than when he went in.  He had blood work yesterday at the cardiologist office that did show his renal function was up slightly, likely elevated because of the increased diuretic.  He is seeing them in a week so likely they will need to check it when he returns.  Patient did not want to check blood work today  Patient still has the wounds on both of his legs from the vasculitis and the chronic lymphedema.  He is working with Dr. Lajoyce Corners and says they are improving.  Relevant past medical, surgical, family and social history reviewed and updated as indicated. Interim medical history since our last visit reviewed. Allergies and medications reviewed and updated.  Review of Systems  Constitutional:  Negative for chills and fever.  HENT:  Negative for congestion.   Eyes:  Negative for discharge.  Respiratory:  Positive for shortness of breath. Negative for wheezing.   Cardiovascular:  Positive for leg swelling. Negative for chest pain and palpitations.  Musculoskeletal:  Negative  for back pain and gait problem.  Skin:  Positive for color change and wound. Negative for rash.  All other systems reviewed and are negative.   Per HPI unless specifically indicated above   Allergies as of 06/11/2023       Reactions   Metoprolol    Hypotension         Medication List        Accurate as of June 11, 2023  2:33 PM. If you have any questions, ask your nurse or doctor.          Accu-Chek Aviva device Test BS BID Dx E11.69   Accu-Chek Aviva Plus w/Device Kit 1 each by Does not apply route 4 (four) times daily.   Accu-Chek Guide test strip Generic drug: glucose blood Check BS in the morning and at bedtime Dx E11.8   acetaminophen 325 MG tablet Commonly known as: TYLENOL Take 2 tablets (650 mg total) by mouth every 6 (six) hours as needed for mild pain (pain score 1-3) or fever (or Fever >/= 101).   apixaban 5 MG Tabs tablet Commonly known as: ELIQUIS Take 1 tablet (5 mg total) by mouth 2 (two) times daily.   aspirin EC 81 MG tablet Take 1 tablet (81 mg total) by mouth daily with breakfast. Swallow whole.   atorvastatin 80 MG tablet Commonly known as: LIPITOR Take  1 tablet (80 mg total) by mouth daily.   BD Pen Needle Nano 2nd Gen 32G X 4 MM Misc Generic drug: Insulin Pen Needle Use daily with insulin Dx E11.9, Z79.4   digoxin 0.125 MG tablet Commonly known as: LANOXIN Take 1 tablet (0.125 mg total) by mouth daily.   empagliflozin 25 MG Tabs tablet Commonly known as: Jardiance Take 1 tablet (25 mg total) by mouth daily.   famotidine 20 MG tablet Commonly known as: Pepcid Take 1 tablet (20 mg total) by mouth 2 (two) times daily.   fenofibrate 145 MG tablet Commonly known as: TRICOR Take 1 tablet (145 mg total) by mouth daily.   fluticasone 50 MCG/ACT nasal spray Commonly known as: FLONASE Place 1 spray into both nostrils 2 (two) times daily as needed for allergies or rhinitis.   hydrOXYzine 25 MG capsule Commonly known as:  VISTARIL Take 1 capsule (25 mg total) by mouth every 8 (eight) hours as needed.   pantoprazole 40 MG tablet Commonly known as: PROTONIX Take 40 mg by mouth daily.   potassium chloride SA 20 MEQ tablet Commonly known as: KLOR-CON M Take 1 tablet (20 mEq total) by mouth daily.   Santyl 250 UNIT/GM ointment Generic drug: collagenase Apply 1 Application topically daily.   silver nitrate applicators 75-25 % applicator Apply topically 3 (three) times a week.   silver sulfADIAZINE 1 % cream Commonly known as: Silvadene Apply 1 Application topically daily.   spironolactone 25 MG tablet Commonly known as: ALDACTONE Take 0.5 tablets (12.5 mg total) by mouth daily.   torsemide 20 MG tablet Commonly known as: DEMADEX Take 2 tablets (40 mg total) by mouth daily. What changed: how much to take         Objective:   BP 106/78   Ht 6\' 5"  (1.956 m)   Wt 251 lb (113.9 kg)   SpO2 91%   BMI 29.76 kg/m   Wt Readings from Last 3 Encounters:  06/11/23 251 lb (113.9 kg)  06/10/23 252 lb 9.6 oz (114.6 kg)  06/03/23 238 lb 1.6 oz (108 kg)    Physical Exam Vitals and nursing note reviewed.  Constitutional:      General: He is not in acute distress.    Appearance: He is well-developed. He is not diaphoretic.  Eyes:     General: No scleral icterus.    Conjunctiva/sclera: Conjunctivae normal.  Neck:     Thyroid: No thyromegaly.  Cardiovascular:     Rate and Rhythm: Normal rate and regular rhythm.     Heart sounds: Normal heart sounds. No murmur heard. Pulmonary:     Effort: Pulmonary effort is normal. No respiratory distress.     Breath sounds: Normal breath sounds. No wheezing, rhonchi or rales.  Musculoskeletal:        General: Swelling (Appears to be 2+ edema but wrapped with Unna boot so did not fully examine) present. Normal range of motion.     Cervical back: Neck supple.  Lymphadenopathy:     Cervical: No cervical adenopathy.  Skin:    Comments: Legs are wrapped in Unna  boots but did not fully examine.  He is seeing wound care  Neurological:     Mental Status: He is alert and oriented to person, place, and time.     Coordination: Coordination normal.  Psychiatric:        Behavior: Behavior normal.       Assessment & Plan:   Problem List Items Addressed This Visit  Cardiovascular and Mediastinum   Acute on chronic combined systolic and diastolic CHF (congestive heart failure) (HCC) - Primary   Vasculitis (HCC)   Other Visit Diagnoses       Diabetic leg ulcer (HCC)           Continue to work with wound care and cardiology, we will manage on the peripheral.  He did just have blood work checked yesterday so will not recheck blood work today Follow up plan: Return if symptoms worsen or fail to improve, for 12-month recheck on diabetes.  Counseling provided for all of the vaccine components No orders of the defined types were placed in this encounter.   Arville Care, MD [facility] 06/11/2023, 2:33 PM

## 2023-06-12 ENCOUNTER — Ambulatory Visit: Payer: Medicaid Other | Admitting: Family

## 2023-06-12 DIAGNOSIS — M1990 Unspecified osteoarthritis, unspecified site: Secondary | ICD-10-CM | POA: Diagnosis not present

## 2023-06-12 DIAGNOSIS — L97911 Non-pressure chronic ulcer of unspecified part of right lower leg limited to breakdown of skin: Secondary | ICD-10-CM

## 2023-06-12 DIAGNOSIS — L97921 Non-pressure chronic ulcer of unspecified part of left lower leg limited to breakdown of skin: Secondary | ICD-10-CM

## 2023-06-12 DIAGNOSIS — I255 Ischemic cardiomyopathy: Secondary | ICD-10-CM | POA: Diagnosis not present

## 2023-06-12 DIAGNOSIS — L84 Corns and callosities: Secondary | ICD-10-CM | POA: Diagnosis not present

## 2023-06-12 DIAGNOSIS — L97811 Non-pressure chronic ulcer of other part of right lower leg limited to breakdown of skin: Secondary | ICD-10-CM | POA: Diagnosis not present

## 2023-06-12 DIAGNOSIS — L97821 Non-pressure chronic ulcer of other part of left lower leg limited to breakdown of skin: Secondary | ICD-10-CM | POA: Diagnosis not present

## 2023-06-12 DIAGNOSIS — I4891 Unspecified atrial fibrillation: Secondary | ICD-10-CM | POA: Diagnosis not present

## 2023-06-12 DIAGNOSIS — G4733 Obstructive sleep apnea (adult) (pediatric): Secondary | ICD-10-CM | POA: Diagnosis not present

## 2023-06-12 DIAGNOSIS — I4729 Other ventricular tachycardia: Secondary | ICD-10-CM | POA: Diagnosis not present

## 2023-06-12 DIAGNOSIS — I5042 Chronic combined systolic (congestive) and diastolic (congestive) heart failure: Secondary | ICD-10-CM | POA: Diagnosis not present

## 2023-06-12 DIAGNOSIS — I776 Arteritis, unspecified: Secondary | ICD-10-CM | POA: Diagnosis not present

## 2023-06-12 DIAGNOSIS — I87333 Chronic venous hypertension (idiopathic) with ulcer and inflammation of bilateral lower extremity: Secondary | ICD-10-CM

## 2023-06-12 DIAGNOSIS — I890 Lymphedema, not elsewhere classified: Secondary | ICD-10-CM

## 2023-06-12 DIAGNOSIS — I272 Pulmonary hypertension, unspecified: Secondary | ICD-10-CM | POA: Diagnosis not present

## 2023-06-12 DIAGNOSIS — I252 Old myocardial infarction: Secondary | ICD-10-CM | POA: Diagnosis not present

## 2023-06-12 DIAGNOSIS — I2510 Atherosclerotic heart disease of native coronary artery without angina pectoris: Secondary | ICD-10-CM | POA: Diagnosis not present

## 2023-06-12 DIAGNOSIS — Z79899 Other long term (current) drug therapy: Secondary | ICD-10-CM | POA: Diagnosis not present

## 2023-06-12 DIAGNOSIS — I110 Hypertensive heart disease with heart failure: Secondary | ICD-10-CM | POA: Diagnosis not present

## 2023-06-12 DIAGNOSIS — Z7982 Long term (current) use of aspirin: Secondary | ICD-10-CM | POA: Diagnosis not present

## 2023-06-12 DIAGNOSIS — I5082 Biventricular heart failure: Secondary | ICD-10-CM | POA: Diagnosis not present

## 2023-06-12 DIAGNOSIS — K219 Gastro-esophageal reflux disease without esophagitis: Secondary | ICD-10-CM | POA: Diagnosis not present

## 2023-06-12 DIAGNOSIS — E782 Mixed hyperlipidemia: Secondary | ICD-10-CM | POA: Diagnosis not present

## 2023-06-12 DIAGNOSIS — E11628 Type 2 diabetes mellitus with other skin complications: Secondary | ICD-10-CM | POA: Diagnosis not present

## 2023-06-12 DIAGNOSIS — M109 Gout, unspecified: Secondary | ICD-10-CM | POA: Diagnosis not present

## 2023-06-15 ENCOUNTER — Ambulatory Visit: Payer: Medicaid Other | Admitting: Orthopedic Surgery

## 2023-06-15 ENCOUNTER — Encounter: Payer: Self-pay | Admitting: Orthopedic Surgery

## 2023-06-15 DIAGNOSIS — I87333 Chronic venous hypertension (idiopathic) with ulcer and inflammation of bilateral lower extremity: Secondary | ICD-10-CM | POA: Diagnosis not present

## 2023-06-15 NOTE — Progress Notes (Signed)
Office Visit Note   Patient: Evan [name]           Date of Birth: [birth year]           MRN: [id number] Visit Date: 06/15/2023              Requested by: Dettinger, Elige Radon, MD [address] [place],  [place] [address] PCP: Dettinger, Elige Radon, MD  No chief complaint on file.     HPI: Patient is a 58 year old gentleman who presents in follow-up for venous insufficiency ulcerations bilateral lower extremities.  Patient has undergone compression wraps on January 21 and 25.  Assessment & Plan: Visit Diagnoses:  1. 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)     Plan: Dynaflex wraps were applied to both lower extremities.  Follow-Up Instructions: Return in about 1 week (around 06/22/2023).   Ortho Exam  Patient is alert, oriented, no adenopathy, well-dressed, normal affect, normal respiratory effort. Examination the venous ulcers of both legs is showing improved granulation tissue in the wound bed.  There is no cellulitis there is decreased swelling there is wrinkling of the skin.  Imaging: No results found. No images are attached to the encounter.  Labs: Lab Results  Component Value Date   HGBA1C 7.3 (H) 04/19/2023   HGBA1C 7.4 (H) 02/11/2023   HGBA1C 7.6 (H) 11/10/2022   ESRSEDRATE 3 05/05/2023   ESRSEDRATE 1 05/05/2023   CRP 2.0 (H) 05/05/2023   CRP 2.5 (H) 05/05/2023   LABURIC 7.1 10/12/2019   LABURIC 6.1 11/24/2018   LABURIC 5.9 02/04/2017   REPTSTATUS 05/10/2023 FINAL 05/05/2023   CULT  05/05/2023    NO GROWTH 5 DAYS Performed at [facility], [address], [place], [place] [address]      Lab Results  Component Value Date   ALBUMIN 2.9 (L) 06/10/2023   ALBUMIN 3.1 (L) 05/28/2023   ALBUMIN 3.0 (L) 05/27/2023   PREALBUMIN 12 (L) 05/05/2023    Lab Results  Component Value Date   MG 2.2 06/02/2023   MG 2.0 06/01/2023   MG 2.2 05/31/2023   No results found for: "VD25OH"  Lab  Results  Component Value Date   PREALBUMIN 12 (L) 05/05/2023      Latest Ref Rng & Units 06/01/2023    7:17 AM 05/31/2023    4:44 AM 05/30/2023    4:15 AM  CBC EXTENDED  WBC 4.0 - 10.5 K/uL 10.0  10.3  11.7   RBC 4.22 - 5.81 MIL/uL 5.62  5.62  5.37   Hemoglobin 13.0 - 17.0 g/dL 82.9  56.2  13.0   HCT 39.0 - 52.0 % 45.0  44.8  42.7   Platelets 150 - 400 K/uL 254  244  255   NEUT# 1.7 - 7.7 K/uL   9.3   Lymph# 0.7 - 4.0 K/uL   1.1      There is no height or weight on file to calculate BMI.  Orders:  No orders of the defined types were placed in this encounter.  No orders of the defined types were placed in this encounter.    Procedures: No procedures performed  Clinical Data: No additional findings.  ROS:  All other systems negative, except as noted in the HPI. Review of Systems  Objective: Vital Signs: There were no vitals taken for this visit.  Specialty Comments:  No specialty comments available.  PMFS History: Patient Active Problem List   Diagnosis Date Noted  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) 05/28/2023   Lymphedema 05/28/2023   Nonsustained ventricular tachycardia (HCC) 05/28/2023   History of coronary artery stent placement 05/28/2023   Left ventricular thrombus 05/28/2023   Pulmonary hypertension, unspecified (HCC) 05/28/2023   OSA (obstructive sleep apnea) 05/28/2023   Cocaine use 05/28/2023   Atherosclerosis of native coronary artery of native heart without angina pectoris 05/28/2023   Biventricular heart failure (HCC) 05/28/2023   Acute decompensated heart failure (HCC) 05/28/2023   Acute HFrEF (heart failure with reduced ejection fraction) (HCC) 05/27/2023   Diabetic foot ulcer (HCC) 05/05/2023   History of acute cholecystitis 04/18/23 treated conservatively 05/05/2023   Acute on chronic HFrEF (heart failure with reduced ejection fraction) (HCC) 05/05/2023   Diabetic ulcer of  lower leg (HCC) 05/05/2023   Cellulitis in diabetic foot (HCC) 05/05/2023   CHF (congestive heart failure) (HCC) 05/05/2023   Bilateral leg ulcer (HCC) 05/05/2023   Acute on chronic combined systolic and diastolic CHF (congestive heart failure) (HCC) 05/05/2023   Vasculitis (HCC) 05/05/2023   Abdominal pain 04/19/2023   Elevated brain natriuretic peptide (BNP) level 04/19/2023   Elevated troponin 04/19/2023   Elevated d-dimer 04/19/2023   Type 2 diabetes mellitus with hyperglycemia (HCC) 04/19/2023   Acute cholecystitis 04/18/2023   Hypertension 10/06/2022   Type 2 diabetes mellitus with complications (HCC) 02/15/2021   CAD S/P PCI 12/09/2018   Ischemic cardiomyopathy 12/09/2018   Left ventricular dysfunction    History of non-ST elevation myocardial infarction (NSTEMI) 11/24/2018   Obesity (BMI 30.0-34.9) 01/05/2018   GERD (gastroesophageal reflux disease) 07/08/2017   Mixed hyperlipidemia 12/22/2016   Essential hypertension 09/22/2016   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) 09/22/2016   Gout 09/22/2016   Past Medical History:  Diagnosis Date   Arthritis    Atrial fibrillation (HCC)    CHF (congestive heart failure) (HCC)    Diabetes mellitus without complication (HCC)    Gout    Heart attack (HCC) 10/2018   Hyperlipidemia    Hypertension    Morbid obesity (HCC) 12/22/2016   Sleep apnea    had test twice unable to complete    Family History  Problem Relation Age of Onset   Diabetes Mother    Stroke Mother    Early death Father    Heart attack Father        died from heart attack at the age of 32   Alcohol abuse Father    Heart disease Father    Breast cancer Sister    Hypertension Brother    Heart attack Maternal Grandfather        died from heart attack at the age of 104   Early death Paternal Grandfather    Heart attack Paternal Grandfather    Colon cancer Neg Hx    Esophageal cancer Neg Hx    Rectal cancer Neg Hx    Stomach cancer Neg Hx      Past Surgical History:  Procedure Laterality Date   ANKLE ARTHROSCOPY Right    COLONOSCOPY WITH PROPOFOL N/A 09/18/2020   Procedure: COLONOSCOPY WITH PROPOFOL;  Surgeon: Sherrilyn Rist, MD;  Location: [facility];  Service: Gastroenterology;  Laterality: N/A;  09-18-2020   CORONARY STENT INTERVENTION N/A 11/24/2018   Procedure: CORONARY STENT INTERVENTION;  Surgeon: Iran Ouch, MD;  Location: [facility];  Service: Cardiovascular;  Laterality: N/A;  prox and mid LAD   CORONARY STENT INTERVENTION N/A 11/26/2018  Procedure: CORONARY STENT INTERVENTION;  Surgeon: Iran Ouch, MD;  Location: [facility];  Service: Cardiovascular;  Laterality: N/A;   CYST REMOVAL HAND     KNEE ARTHROSCOPY Left    LEFT HEART CATH AND CORONARY ANGIOGRAPHY N/A 11/24/2018   Procedure: LEFT HEART CATH AND CORONARY ANGIOGRAPHY;  Surgeon: Iran Ouch, MD;  Location: [facility];  Service: Cardiovascular;  Laterality: N/A;   POLYPECTOMY  09/18/2020   Procedure: POLYPECTOMY;  Surgeon: Sherrilyn Rist, MD;  Location: [facility];  Service: Gastroenterology;;   Social History   Occupational History   Occupation: Disability  Tobacco Use   Smoking status: Never   Smokeless tobacco: Never  Vaping Use   Vaping status: Never Used  Substance and Sexual Activity   Alcohol use: Not Currently    Alcohol/week: 12.0 standard drinks of alcohol    Types: 12 Cans of beer per week   Drug use: No   Sexual activity: Yes    Comment: male 8 months partner

## 2023-06-17 ENCOUNTER — Ambulatory Visit: Payer: Medicaid Other | Attending: Cardiovascular Disease | Admitting: Cardiovascular Disease

## 2023-06-17 ENCOUNTER — Encounter: Payer: Self-pay | Admitting: Cardiovascular Disease

## 2023-06-17 ENCOUNTER — Encounter: Payer: Self-pay | Admitting: Family

## 2023-06-17 VITALS — BP 102/76 | HR 93 | Ht 77.0 in | Wt 242.0 lb

## 2023-06-17 DIAGNOSIS — E782 Mixed hyperlipidemia: Secondary | ICD-10-CM | POA: Diagnosis not present

## 2023-06-17 DIAGNOSIS — F1910 Other psychoactive substance abuse, uncomplicated: Secondary | ICD-10-CM

## 2023-06-17 DIAGNOSIS — I252 Old myocardial infarction: Secondary | ICD-10-CM | POA: Diagnosis not present

## 2023-06-17 DIAGNOSIS — I10 Essential (primary) hypertension: Secondary | ICD-10-CM | POA: Diagnosis not present

## 2023-06-17 DIAGNOSIS — I519 Heart disease, unspecified: Secondary | ICD-10-CM | POA: Diagnosis not present

## 2023-06-17 DIAGNOSIS — I513 Intracardiac thrombosis, not elsewhere classified: Secondary | ICD-10-CM | POA: Diagnosis not present

## 2023-06-17 NOTE — Assessment & Plan Note (Signed)
Apparently he was using cocaine.  We talked about the importance of abstinence from illicit drugs.

## 2023-06-17 NOTE — Assessment & Plan Note (Signed)
History of mixed ischemic and nonischemic cardiomyopathy with an EF initially at the time of his myocardial infarction of 20 to 25%.  Ultimately with pharmacologic therapy his EF came up to 35 to 40% however his most recent 2D echo performed 05/05/2023 revealed EF of 20% with global hypokinesia.  He apparently does have a LV mural thrombus for which she is on Eliquis as well as New York Heart Association stage III heart failure.  His hypotension limits his GDMT unfortunately.  He may be a candidate for ICD implantation for primary prevention.

## 2023-06-17 NOTE — Assessment & Plan Note (Signed)
History of late presentation anterolateral MI 11/24/2018 treated with intervention by Dr. Kirke Corin of his proximal LAD.  He had staged intervention of his RCA several days later.  He currently is on aspirin.  He denies chest pain.

## 2023-06-17 NOTE — Progress Notes (Signed)
06/17/2023 Laurice Record   1965/12/08  [id number]  Primary Physician Dettinger, Elige Radon, MD Primary Cardiologist: Runell Gess MD FACP, [place], [place], [place]  HPI:  Evan [name] is a 58 y.o.  moderate to severely overweight divorced Caucasian male father of 2, grandfather of 2 grandchildren, 1 of whom is partially disabled.  His primary care provider is Dr. Louanne Skye.  I last saw him in the office 09/18/2021. His risk factors include treated hypertension hyperlipidemia as well as family history.  He had a delayed presentation of anterolateral myocardial infarction 11/24/2018 and had intervention by Dr. Kirke Corin of his proximal LAD.  He had staged RCA intervention several days later.  His EF was in the 20 to 25% range by 2D echo and he was discharged home on appropriate pharmacology and a LifeVest.  He is making lifestyle changes, has lost weight and is eating a more healthy diet.    He is mowing lawns, working out and walk frequently without limitation.  His most recent 2D echocardiogram performed after pharmacologic therapy for LV dysfunction on 02/23/2019 revealed an improvement in his EF from 20 to 25% up to 35 to 40%.  Based on this I suggested that we discontinue the LifeVest.  He says that his lifestyle change.  Is eating habits have as well.     Since I saw him in the office a year and a half ago he was admitted with heart failure 05/26/2023 for approximately a week and was diuresed.  His admission weight was 277 and his current weight is 242.  He did require midodrine for blood pressure maintenance because of a soft blood pressure.  His echo revealed an EF of 20% 05/05/2023.  His low blood pressure limited GDMT unfortunately.  He feels much better since discharge.  He is aware of salt restriction.  He does have venous stasis ulcers being treated by Dr. Lajoyce Corners.  Current Meds  Medication Sig   acetaminophen (TYLENOL) 325 MG tablet Take 2 tablets (650 mg total) by mouth every 6 (six) hours as  needed for mild pain (pain score 1-3) or fever (or Fever >/= 101).   apixaban (ELIQUIS) 5 MG TABS tablet Take 1 tablet (5 mg total) by mouth 2 (two) times daily.   aspirin EC 81 MG tablet Take 1 tablet (81 mg total) by mouth daily with breakfast. Swallow whole.   atorvastatin (LIPITOR) 80 MG tablet Take 1 tablet (80 mg total) by mouth daily.   BD PEN NEEDLE NANO 2ND GEN 32G X 4 MM MISC Use daily with insulin Dx E11.9, Z79.4   Blood Glucose Monitoring Suppl (ACCU-CHEK AVIVA PLUS) w/Device KIT 1 each by Does not apply route 4 (four) times daily.   Blood Glucose Monitoring Suppl (ACCU-CHEK AVIVA) device Test BS BID Dx E11.69   collagenase (SANTYL) 250 UNIT/GM ointment Apply 1 Application topically daily.   digoxin (LANOXIN) 0.125 MG tablet Take 1 tablet (0.125 mg total) by mouth daily.   empagliflozin (JARDIANCE) 25 MG TABS tablet Take 1 tablet (25 mg total) by mouth daily.   famotidine (PEPCID) 20 MG tablet Take 1 tablet (20 mg total) by mouth 2 (two) times daily.   fenofibrate (TRICOR) 145 MG tablet Take 1 tablet (145 mg total) by mouth daily.   fluticasone (FLONASE) 50 MCG/ACT nasal spray Place 1 spray into both nostrils 2 (two) times daily as needed for allergies or rhinitis.   glucose blood (ACCU-CHEK GUIDE) test strip Check BS in the morning and at  bedtime Dx E11.8   hydrOXYzine (VISTARIL) 25 MG capsule Take 1 capsule (25 mg total) by mouth every 8 (eight) hours as needed.   pantoprazole (PROTONIX) 40 MG tablet Take 40 mg by mouth daily.   potassium chloride SA (KLOR-CON M) 20 MEQ tablet Take 1 tablet (20 mEq total) by mouth daily.   silver nitrate applicators 75-25 % applicator Apply topically 3 (three) times a week.   silver sulfADIAZINE (SILVADENE) 1 % cream Apply 1 Application topically daily.   spironolactone (ALDACTONE) 25 MG tablet Take 0.5 tablets (12.5 mg total) by mouth daily.   torsemide (DEMADEX) 20 MG tablet Take 2 tablets (40 mg total) by mouth daily. (Patient taking differently:  Take 60 mg by mouth daily.)     Allergies  Allergen Reactions   Metoprolol     Hypotension     Social History   Socioeconomic History   Marital status: Divorced    Spouse name: Not on file   Number of children: 2   Years of education: Not on file   Highest education level: High school graduate  Occupational History   Occupation: Disability  Tobacco Use   Smoking status: Never   Smokeless tobacco: Never  Vaping Use   Vaping status: Never Used  Substance and Sexual Activity   Alcohol use: Not Currently    Alcohol/week: 12.0 standard drinks of alcohol    Types: 12 Cans of beer per week   Drug use: No   Sexual activity: Yes    Comment: male 8 months partner  Other Topics Concern   Not on file  Social History Narrative   Not on file   Social Drivers of Health   Financial Resource Strain: Low Risk  (05/28/2023)   Overall Financial Resource Strain (CARDIA)    Difficulty of Paying Living Expenses: Not hard at all  Food Insecurity: No Food Insecurity (05/28/2023)   Hunger Vital Sign    Worried About Running Out of Food in the Last Year: Never true    Ran Out of Food in the Last Year: Never true  Transportation Needs: No Transportation Needs (05/28/2023)   PRAPARE - Administrator, Civil Service (Medical): No    Lack of Transportation (Non-Medical): No  Physical Activity: Not on file  Stress: Not on file  Social Connections: Not on file  Intimate Partner Violence: Not At Risk (05/28/2023)   Humiliation, Afraid, Rape, and Kick questionnaire    Fear of Current or Ex-Partner: No    Emotionally Abused: No    Physically Abused: No    Sexually Abused: No     Review of Systems: General: negative for chills, fever, night sweats or weight changes.  Cardiovascular: negative for chest pain, dyspnea on exertion, edema, orthopnea, palpitations, paroxysmal nocturnal dyspnea or shortness of breath Dermatological: negative for rash Respiratory: negative for cough or  wheezing Urologic: negative for hematuria Abdominal: negative for nausea, vomiting, diarrhea, bright red blood per rectum, melena, or hematemesis Neurologic: negative for visual changes, syncope, or dizziness All other systems reviewed and are otherwise negative except as noted above.    Blood pressure 102/76, pulse 93, height 6\' 5"  (1.956 m), weight 242 lb (109.8 kg), SpO2 96%.  General appearance: alert and no distress Neck: no adenopathy, no carotid bruit, no JVD, supple, symmetrical, trachea midline, and thyroid not enlarged, symmetric, no tenderness/mass/nodules Lungs: clear to auscultation bilaterally Heart: regular rate and rhythm, S1, S2 normal, no murmur, click, rub or gallop Extremities: extremities normal, atraumatic, no cyanosis  or edema Pulses: 2+ and symmetric Skin: Lower extremities are wrapped Neurologic: Grossly normal  EKG EKG Interpretation Date/Time:  Wednesday June 17 2023 08:50:24 EST Ventricular Rate:  93 PR Interval:  164 QRS Duration:  90 QT Interval:  378 QTC Calculation: 469 R Axis:   -55  Text Interpretation: Sinus rhythm with occasional Premature ventricular complexes Possible Left atrial enlargement Left axis deviation Septal infarct (cited on or before 17-Jun-2023) When compared with ECG of 26-May-2023 17:41, Previous ECG has undetermined rhythm, needs review QRS axis Shifted left Confirmed by Nanetta Batty [phone]) on 06/17/2023 9:02:31 AM    ASSESSMENT AND PLAN:   Essential hypertension History of essential hypertension in the past however recently he has been hypotensive and had been on midodrine to maintain blood pressure.  He is on no antihypertensive medications.  Mixed hyperlipidemia History of hyperlipidemia on statin therapy and fenofibrate with lipid profile performed 02/11/2023 revealing total cholesterol 119, LDL 70 and HDL 33.  History of non-ST elevation myocardial infarction (NSTEMI) History of late presentation anterolateral MI  11/24/2018 treated with intervention by Dr. Kirke Corin of his proximal LAD.  He had staged intervention of his RCA several days later.  He currently is on aspirin.  He denies chest pain.  Left ventricular dysfunction History of mixed ischemic and nonischemic cardiomyopathy with an EF initially at the time of his myocardial infarction of 20 to 25%.  Ultimately with pharmacologic therapy his EF came up to 35 to 40% however his most recent 2D echo performed 05/05/2023 revealed EF of 20% with global hypokinesia.  He apparently does have a LV mural thrombus for which she is on Eliquis as well as New York Heart Association stage III heart failure.  His hypotension limits his GDMT unfortunately.  He may be a candidate for ICD implantation for primary prevention.  Polysubstance abuse (HCC) Apparently he was using cocaine.  We talked about the importance of abstinence from illicit drugs.  Left ventricular thrombus Currently on apixaban.     Runell Gess MD FACP,FACC,FAHA, [facility] 06/17/2023 9:18 AM

## 2023-06-17 NOTE — Assessment & Plan Note (Signed)
Currently on apixaban.

## 2023-06-17 NOTE — Assessment & Plan Note (Signed)
History of essential hypertension in the past however recently he has been hypotensive and had been on midodrine to maintain blood pressure.  He is on no antihypertensive medications.

## 2023-06-17 NOTE — Assessment & Plan Note (Signed)
History of hyperlipidemia on statin therapy and fenofibrate with lipid profile performed 02/11/2023 revealing total cholesterol 119, LDL 70 and HDL 33.

## 2023-06-17 NOTE — Patient Instructions (Signed)
    Follow-Up: At [facility], you and your health needs are our priority.  As part of our continuing mission to provide you with exceptional heart care, we have created designated Provider Care Teams.  These Care Teams include your primary Cardiologist (physician) and Advanced Practice Providers (APPs -  Physician Assistants and Nurse Practitioners) who all work together to provide you with the care you need, when you need it.  We recommend signing up for the patient portal called "MyChart".  Sign up information is provided on this After Visit Summary.  MyChart is used to connect with patients for Virtual Visits (Telemedicine).  Patients are able to view lab/test results, encounter notes, upcoming appointments, etc.  Non-urgent messages can be sent to your provider as well.   To learn more about what you can do with MyChart, go to [email or web address].    Your next appointment:   6 month(s)  Provider:   Reather Littler NP  Your physician recommends that you schedule a follow-up appointment in: 12 MONTHS WITH DR Allyson Sabal

## 2023-06-17 NOTE — Progress Notes (Signed)
Office Visit Note   Patient: Evan [name]           Date of Birth: 08/13/1965           MRN: [id number] Visit Date: 06/12/2023              Requested by: Dettinger, Elige Radon, MD [address] [place],  [place] [address] PCP: Dettinger, Elige Radon, MD  Chief Complaint  Patient presents with   Right Leg - Wound Check   Left Leg - Wound Check      HPI: Patient is seen in follow-up for venous insufficiency ulcers both lower extremities.  Has been in Dynaflex compression wraps to bilateral lower extremities.  Patient was recently hospitalized for his congestive heart failure and states that he lost 44 pounds of fluid.  He is currently on Lasix 40 mg twice a day.  Assessment & Plan: Visit Diagnoses:  No diagnosis found.   Plan: Will reapply Vashe dressings and 3 layer compression wraps to both lower extremities follow-up in 1 week.  Discussed elevation.   Follow-Up Instructions: No follow-ups on file.   Ortho Exam  Patient is alert, oriented, no adenopathy, well-dressed, normal affect, normal respiratory effort. Examination patient has decreased swelling in both legs with wrinkling of the skin.  He has multiple venous stasis ulcers on both legs.  He has a palpable dorsalis pedis pulse no evidence of arterial insufficiency.  There is no cellulitis.  Imaging: No results found. No images are attached to the encounter.  Labs: Lab Results  Component Value Date   HGBA1C 7.3 (H) 04/19/2023   HGBA1C 7.4 (H) 02/11/2023   HGBA1C 7.6 (H) 11/10/2022   ESRSEDRATE 3 05/05/2023   ESRSEDRATE 1 05/05/2023   CRP 2.0 (H) 05/05/2023   CRP 2.5 (H) 05/05/2023   LABURIC 7.1 10/12/2019   LABURIC 6.1 11/24/2018   LABURIC 5.9 02/04/2017   REPTSTATUS 05/10/2023 FINAL 05/05/2023   CULT  05/05/2023    NO GROWTH 5 DAYS Performed at [facility], [address], [place], [place] [address]      Lab Results  Component Value Date   ALBUMIN 2.9 (L) 06/10/2023   ALBUMIN 3.1 (L) 05/28/2023    ALBUMIN 3.0 (L) 05/27/2023   PREALBUMIN 12 (L) 05/05/2023    Lab Results  Component Value Date   MG 2.2 06/02/2023   MG 2.0 06/01/2023   MG 2.2 05/31/2023   No results found for: "VD25OH"  Lab Results  Component Value Date   PREALBUMIN 12 (L) 05/05/2023      Latest Ref Rng & Units 06/01/2023    7:17 AM 05/31/2023    4:44 AM 05/30/2023    4:15 AM  CBC EXTENDED  WBC 4.0 - 10.5 K/uL 10.0  10.3  11.7   RBC 4.22 - 5.81 MIL/uL 5.62  5.62  5.37   Hemoglobin 13.0 - 17.0 g/dL 72.5  36.6  44.0   HCT 39.0 - 52.0 % 45.0  44.8  42.7   Platelets 150 - 400 K/uL 254  244  255   NEUT# 1.7 - 7.7 K/uL   9.3   Lymph# 0.7 - 4.0 K/uL   1.1      There is no height or weight on file to calculate BMI.  Orders:  No orders of the defined types were placed in this encounter.  No orders of the defined types were placed in this encounter.    Procedures: No procedures performed  Clinical Data: No additional  findings.  ROS:  All other systems negative, except as noted in the HPI. Review of Systems  Objective: Vital Signs: There were no vitals taken for this visit.  Specialty Comments:  No specialty comments available.  PMFS History: Patient Active Problem List   Diagnosis Date Noted   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) 05/28/2023   Lymphedema 05/28/2023   Nonsustained ventricular tachycardia (HCC) 05/28/2023   History of coronary artery stent placement 05/28/2023   Left ventricular thrombus 05/28/2023   Pulmonary hypertension, unspecified (HCC) 05/28/2023   OSA (obstructive sleep apnea) 05/28/2023   Cocaine use 05/28/2023   Atherosclerosis of native coronary artery of native heart without angina pectoris 05/28/2023   Biventricular heart failure (HCC) 05/28/2023   Acute decompensated heart failure (HCC) 05/28/2023   Acute HFrEF (heart failure with reduced ejection fraction) (HCC) 05/27/2023   Diabetic foot  ulcer (HCC) 05/05/2023   History of acute cholecystitis 04/18/23 treated conservatively 05/05/2023   Acute on chronic HFrEF (heart failure with reduced ejection fraction) (HCC) 05/05/2023   Diabetic ulcer of lower leg (HCC) 05/05/2023   Cellulitis in diabetic foot (HCC) 05/05/2023   CHF (congestive heart failure) (HCC) 05/05/2023   Bilateral leg ulcer (HCC) 05/05/2023   Acute on chronic combined systolic and diastolic CHF (congestive heart failure) (HCC) 05/05/2023   Vasculitis (HCC) 05/05/2023   Abdominal pain 04/19/2023   Elevated brain natriuretic peptide (BNP) level 04/19/2023   Elevated troponin 04/19/2023   Elevated d-dimer 04/19/2023   Type 2 diabetes mellitus with hyperglycemia (HCC) 04/19/2023   Acute cholecystitis 04/18/2023   Hypertension 10/06/2022   Type 2 diabetes mellitus with complications (HCC) 02/15/2021   CAD S/P PCI 12/09/2018   Ischemic cardiomyopathy 12/09/2018   Left ventricular dysfunction    History of non-ST elevation myocardial infarction (NSTEMI) 11/24/2018   Obesity (BMI 30.0-34.9) 01/05/2018   GERD (gastroesophageal reflux disease) 07/08/2017   Mixed hyperlipidemia 12/22/2016   Essential hypertension 09/22/2016   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) 09/22/2016   Gout 09/22/2016   Past Medical History:  Diagnosis Date   Arthritis    Atrial fibrillation (HCC)    CHF (congestive heart failure) (HCC)    Diabetes mellitus without complication (HCC)    Gout    Heart attack (HCC) 10/2018   Hyperlipidemia    Hypertension    Morbid obesity (HCC) 12/22/2016   Sleep apnea    had test twice unable to complete    Family History  Problem Relation Age of Onset   Diabetes Mother    Stroke Mother    Early death Father    Heart attack Father        died from heart attack at the age of 64   Alcohol abuse Father    Heart disease Father    Breast cancer Sister    Hypertension Brother    Heart attack Maternal Grandfather        died from  heart attack at the age of 86   Early death Paternal Grandfather    Heart attack Paternal Grandfather    Colon cancer Neg Hx    Esophageal cancer Neg Hx    Rectal cancer Neg Hx    Stomach cancer Neg Hx     Past Surgical History:  Procedure Laterality Date   ANKLE ARTHROSCOPY Right    COLONOSCOPY WITH PROPOFOL N/A 09/18/2020   Procedure: COLONOSCOPY WITH PROPOFOL;  Surgeon: Sherrilyn Rist, MD;  Location: [facility];  Service: Gastroenterology;  Laterality: N/A;  09-18-2020   CORONARY STENT INTERVENTION N/A 11/24/2018   Procedure: CORONARY STENT INTERVENTION;  Surgeon: Iran Ouch, MD;  Location: [facility];  Service: Cardiovascular;  Laterality: N/A;  prox and mid LAD   CORONARY STENT INTERVENTION N/A 11/26/2018   Procedure: CORONARY STENT INTERVENTION;  Surgeon: Iran Ouch, MD;  Location: [facility];  Service: Cardiovascular;  Laterality: N/A;   CYST REMOVAL HAND     KNEE ARTHROSCOPY Left    LEFT HEART CATH AND CORONARY ANGIOGRAPHY N/A 11/24/2018   Procedure: LEFT HEART CATH AND CORONARY ANGIOGRAPHY;  Surgeon: Iran Ouch, MD;  Location: [facility];  Service: Cardiovascular;  Laterality: N/A;   POLYPECTOMY  09/18/2020   Procedure: POLYPECTOMY;  Surgeon: Sherrilyn Rist, MD;  Location: [facility];  Service: Gastroenterology;;   Social History   Occupational History   Occupation: Disability  Tobacco Use   Smoking status: Never   Smokeless tobacco: Never  Vaping Use   Vaping status: Never Used  Substance and Sexual Activity   Alcohol use: Not Currently    Alcohol/week: 12.0 standard drinks of alcohol    Types: 12 Cans of beer per week   Drug use: No   Sexual activity: Yes    Comment: male 8 months partner

## 2023-06-18 ENCOUNTER — Encounter: Payer: Medicaid Other | Admitting: Cardiology

## 2023-06-18 NOTE — Progress Notes (Deleted)
 ADVANCED HEART FAILURE CLINIC NOTE  Referring Physician: Dettinger, Evan Radon, MD  Primary Care: [name], Evan Radon, MD   CC: HFrEF  HPI: Evan [name] is a 58 y.o. 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  Evan [name] was admitted to [facility] in January 2025 after presenting with severe volume overload and leaving AMA from his prior hospitalization.  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  Echocardiogram at that time in December with EF of 20% and LV apical thrombus.  During this time he was also using cocaine.  During his hospitalization he was started on milrinone and required midodrine to maintain normal systolic blood pressure.  He was aggressively diuresed and discharged home on digoxin, Jardiance, midodrine and torsemide.  Interval history Since discharge, he has been compliant with medications but reports a 10lb weight gain. He has been taking torsemide 20mg  twice daily rather than 40mg  once in the morning. He also reports feeling increasingly fatigued. Despite all this, he feels much better than he did prior to recent admission at [facility].   Activity level/exercise tolerance:  NYHA III Orthopnea:  Sleeps on 2-3 pillows Paroxysmal noctural dyspnea:  No Chest pain/pressure:  No Orthostatic lightheadedness:  No Palpitations:  No Lower extremity edema:  Yes Presyncope/syncope:  No Cough:  No  Past Medical History:  Diagnosis Date   Arthritis    Atrial fibrillation (HCC)    CHF (congestive heart failure) (HCC)    Diabetes mellitus without complication (HCC)    Gout     Heart attack (HCC) 10/2018   Hyperlipidemia    Hypertension    Morbid obesity (HCC) 12/22/2016   Sleep apnea    had test twice unable to complete    Current Outpatient Medications  Medication Sig Dispense Refill   acetaminophen (TYLENOL) 325 MG tablet Take 2 tablets (650 mg total) by mouth every 6 (six) hours as needed for mild pain (pain score 1-3) or fever (or Fever >/= 101).     apixaban (ELIQUIS) 5 MG TABS tablet Take 1 tablet (5 mg total) by mouth 2 (two) times daily. 60 tablet 2   aspirin EC 81 MG tablet Take 1 tablet (81 mg total) by mouth daily with breakfast. Swallow whole. 30 tablet 12   atorvastatin (LIPITOR) 80 MG tablet Take 1 tablet (80 mg total) by mouth daily. 90 tablet 3   BD PEN NEEDLE NANO 2ND GEN 32G X 4 MM MISC Use daily with insulin Dx E11.9, Z79.4 100 each 3   Blood Glucose Monitoring Suppl (ACCU-CHEK AVIVA PLUS) w/Device KIT 1 each by Does not apply route 4 (four) times daily. 1 kit 1   Blood Glucose Monitoring Suppl (ACCU-CHEK AVIVA) device Test BS BID Dx E11.69 1 each 0   collagenase (SANTYL) 250 UNIT/GM ointment Apply 1 Application topically daily. 90 g 1   digoxin (LANOXIN) 0.125 MG tablet Take 1 tablet (0.125 mg total) by mouth daily. 30 tablet 2   empagliflozin (JARDIANCE) 25 MG TABS tablet Take 1 tablet (25 mg total) by mouth daily. 90 tablet 3   famotidine (PEPCID) 20 MG tablet Take 1 tablet (20 mg total) by mouth 2 (two) times daily. 180 tablet 3  fenofibrate (TRICOR) 145 MG tablet Take 1 tablet (145 mg total) by mouth daily. 90 tablet 3   fluticasone (FLONASE) 50 MCG/ACT nasal spray Place 1 spray into both nostrils 2 (two) times daily as needed for allergies or rhinitis. 16 g 6   glucose blood (ACCU-CHEK GUIDE) test strip Check BS in the morning and at bedtime Dx E11.8 200 strip 3   hydrOXYzine (VISTARIL) 25 MG capsule Take 1 capsule (25 mg total) by mouth every 8 (eight) hours as needed. 30 capsule 2   pantoprazole (PROTONIX) 40 MG tablet Take 40 mg by mouth  daily.     potassium chloride SA (KLOR-CON M) 20 MEQ tablet Take 1 tablet (20 mEq total) by mouth daily. 30 tablet 0   silver nitrate applicators 75-25 % applicator Apply topically 3 (three) times a week. 100 each 0   silver sulfADIAZINE (SILVADENE) 1 % cream Apply 1 Application topically daily. 400 g 1   spironolactone (ALDACTONE) 25 MG tablet Take 0.5 tablets (12.5 mg total) by mouth daily. 45 tablet 3   torsemide (DEMADEX) 20 MG tablet Take 2 tablets (40 mg total) by mouth daily. (Patient taking differently: Take 60 mg by mouth daily.) 60 tablet 2   No current facility-administered medications for this visit.    Allergies  Allergen Reactions   Metoprolol     Hypotension       Social History   Socioeconomic History   Marital status: Divorced    Spouse name: Not on file   Number of children: 2   Years of education: Not on file   Highest education level: High school graduate  Occupational History   Occupation: Disability  Tobacco Use   Smoking status: Never   Smokeless tobacco: Never  Vaping Use   Vaping status: Never Used  Substance and Sexual Activity   Alcohol use: Not Currently    Alcohol/week: 12.0 standard drinks of alcohol    Types: 12 Cans of beer per week   Drug use: No   Sexual activity: Yes    Comment: male 8 months partner  Other Topics Concern   Not on file  Social History Narrative   Not on file   Social Drivers of Health   Financial Resource Strain: Low Risk  (05/28/2023)   Overall Financial Resource Strain (CARDIA)    Difficulty of Paying Living Expenses: Not hard at all  Food Insecurity: No Food Insecurity (05/28/2023)   Hunger Vital Sign    Worried About Running Out of Food in the Last Year: Never true    Ran Out of Food in the Last Year: Never true  Transportation Needs: No Transportation Needs (05/28/2023)   PRAPARE - Administrator, Civil Service (Medical): No    Lack of Transportation (Non-Medical): No  Physical Activity: Not on  file  Stress: Not on file  Social Connections: Not on file  Intimate Partner Violence: Not At Risk (05/28/2023)   Humiliation, Afraid, Rape, and Kick questionnaire    Fear of Current or Ex-Partner: No    Emotionally Abused: No    Physically Abused: No    Sexually Abused: No      Family History  Problem Relation Age of Onset   Diabetes Mother    Stroke Mother    Early death Father    Heart attack Father        died from heart attack at the age of 41   Alcohol abuse Father    Heart disease Father  Breast cancer Sister    Hypertension Brother    Heart attack Maternal Grandfather        died from heart attack at the age of 41   Early death Paternal Grandfather    Heart attack Paternal Grandfather    Colon cancer Neg Hx    Esophageal cancer Neg Hx    Rectal cancer Neg Hx    Stomach cancer Neg Hx     PHYSICAL EXAM: There were no vitals filed for this visit. GENERAL: Well nourished, well developed, and in no apparent distress at rest.  HEENT: There is no scleral icterus.  The mucous membranes are pink and moist.   CHEST: There are no chest wall deformities. There is no chest wall tenderness. Respirations are unlabored.  Lungs- mild basilar crackles CARDIAC:  JVP: 8-10 cm          Normal rate with regular rhythm. No murmur.  Pulses are 2+ and symmetrical in upper and lower extremities. +1 edema.  ABDOMEN: Soft, non-tender, non-distended. There are normal bowel sounds.  EXTREMITIES: Warm and well perfused.  NEUROLOGIC: Patient is oriented x3 with no obvious focal neurologic deficits.  PSYCH: Patients affect is appropriate SKIN: Warm and dry; no lesions or wounds.   There were no vitals filed for this visit.    DATA REVIEW  ECG: 05/28/23: NSR with old anterior infarct  As per my personal interpretation  ECHO: 05/05/23: LVEF 20%, moderately reduced RV function as per my personal interpretation 4/22: LVEF 35%-40%, normal RV function  CATH: 11/24/18:  1.  Severe  three-vessel coronary artery disease.  Occluded ostial and mid LAD likely due to late presenting anterior STEMI.  Significant distal left circumflex disease but the supplied territory is small.  Significant mid RCA stenosis with occluded posterior AV groove with left-to-right collaterals. 2.  Severely reduced LV systolic function with an EF of 20 to 25% with anterior wall akinesis.  LVEDP was 32 mmHg. 3.  Successful PCI and drug-eluting stent placement to the ostial as well as mid LAD.   ASSESSMENT & PLAN:  Heart failure with reduced EF Etiology of WU:JWJXB ischemic & nonsichemic cardiomyopathy; likely exacerbated by polysubstance abuse.  NYHA class / AHA Stage:NYHA II-III Volume status & Diuretics: He is hypervolemic on exam today; only taking torsemide 20 twice daily rather than 40mg  daily. We will increase torsemide to 60mg  x4 days and then 40mg  daily.  Vasodilators:Midodrine 2.5mg  TID; BP 136 systolic today. Will discontinue.  Beta-Blocker:holding due to low output heart failure; dig , will check levels today JYN:[id number] 25mg  daily Cardiometabolic:jardiance 25mg  daily Devices therapies & Valvulopathies:not currently indicated Advanced therapies:not a candidate currently.   2. CAD - s/p PCI in 11/24/18 to the LAD & RCA with anterior wall hypokinesis.  - continue asa 81mg  daily & lipitor 80mg  daily   3. LV thrombus - continue apixaban 5mg  BID  4. Polysubstance abuse - discussed importance of drug cessation; so far he reports doing well with this.   5. Chronic venous insufficency with LE ulcers - Followed by Dr. Lajoyce Corners; last seen on 06/09/23 for massive venous stasis ulcers on both legs.  - Vashe dressings and compression wraps applied with plan for follow up later this week.    ReDs reading: 35 %, midly elevated, increase torsemide.    Joby Richart Advanced Heart Failure Mechanical Circulatory Support

## 2023-06-19 ENCOUNTER — Inpatient Hospital Stay: Payer: Medicaid Other | Admitting: Family Medicine

## 2023-06-19 DIAGNOSIS — K219 Gastro-esophageal reflux disease without esophagitis: Secondary | ICD-10-CM | POA: Diagnosis not present

## 2023-06-19 DIAGNOSIS — I252 Old myocardial infarction: Secondary | ICD-10-CM | POA: Diagnosis not present

## 2023-06-19 DIAGNOSIS — I5042 Chronic combined systolic (congestive) and diastolic (congestive) heart failure: Secondary | ICD-10-CM | POA: Diagnosis not present

## 2023-06-19 DIAGNOSIS — I110 Hypertensive heart disease with heart failure: Secondary | ICD-10-CM | POA: Diagnosis not present

## 2023-06-19 DIAGNOSIS — M109 Gout, unspecified: Secondary | ICD-10-CM | POA: Diagnosis not present

## 2023-06-19 DIAGNOSIS — E782 Mixed hyperlipidemia: Secondary | ICD-10-CM | POA: Diagnosis not present

## 2023-06-19 DIAGNOSIS — I87333 Chronic venous hypertension (idiopathic) with ulcer and inflammation of bilateral lower extremity: Secondary | ICD-10-CM | POA: Diagnosis not present

## 2023-06-19 DIAGNOSIS — I272 Pulmonary hypertension, unspecified: Secondary | ICD-10-CM | POA: Diagnosis not present

## 2023-06-19 DIAGNOSIS — L97811 Non-pressure chronic ulcer of other part of right lower leg limited to breakdown of skin: Secondary | ICD-10-CM | POA: Diagnosis not present

## 2023-06-19 DIAGNOSIS — I890 Lymphedema, not elsewhere classified: Secondary | ICD-10-CM | POA: Diagnosis not present

## 2023-06-19 DIAGNOSIS — L97821 Non-pressure chronic ulcer of other part of left lower leg limited to breakdown of skin: Secondary | ICD-10-CM | POA: Diagnosis not present

## 2023-06-19 DIAGNOSIS — Z7982 Long term (current) use of aspirin: Secondary | ICD-10-CM | POA: Diagnosis not present

## 2023-06-19 DIAGNOSIS — G4733 Obstructive sleep apnea (adult) (pediatric): Secondary | ICD-10-CM | POA: Diagnosis not present

## 2023-06-19 DIAGNOSIS — I776 Arteritis, unspecified: Secondary | ICD-10-CM | POA: Diagnosis not present

## 2023-06-19 DIAGNOSIS — I2510 Atherosclerotic heart disease of native coronary artery without angina pectoris: Secondary | ICD-10-CM | POA: Diagnosis not present

## 2023-06-19 DIAGNOSIS — Z79899 Other long term (current) drug therapy: Secondary | ICD-10-CM | POA: Diagnosis not present

## 2023-06-19 DIAGNOSIS — M1990 Unspecified osteoarthritis, unspecified site: Secondary | ICD-10-CM | POA: Diagnosis not present

## 2023-06-19 DIAGNOSIS — I5082 Biventricular heart failure: Secondary | ICD-10-CM | POA: Diagnosis not present

## 2023-06-19 DIAGNOSIS — I4729 Other ventricular tachycardia: Secondary | ICD-10-CM | POA: Diagnosis not present

## 2023-06-19 DIAGNOSIS — I4891 Unspecified atrial fibrillation: Secondary | ICD-10-CM | POA: Diagnosis not present

## 2023-06-19 DIAGNOSIS — I255 Ischemic cardiomyopathy: Secondary | ICD-10-CM | POA: Diagnosis not present

## 2023-06-19 DIAGNOSIS — E11628 Type 2 diabetes mellitus with other skin complications: Secondary | ICD-10-CM | POA: Diagnosis not present

## 2023-06-19 DIAGNOSIS — L84 Corns and callosities: Secondary | ICD-10-CM | POA: Diagnosis not present

## 2023-06-22 ENCOUNTER — Encounter: Payer: Self-pay | Admitting: Orthopedic Surgery

## 2023-06-22 ENCOUNTER — Ambulatory Visit: Payer: Medicaid Other | Admitting: Orthopedic Surgery

## 2023-06-22 DIAGNOSIS — L97921 Non-pressure chronic ulcer of unspecified part of left lower leg limited to breakdown of skin: Secondary | ICD-10-CM

## 2023-06-22 DIAGNOSIS — I5042 Chronic combined systolic (congestive) and diastolic (congestive) heart failure: Secondary | ICD-10-CM | POA: Diagnosis not present

## 2023-06-22 DIAGNOSIS — I87333 Chronic venous hypertension (idiopathic) with ulcer and inflammation of bilateral lower extremity: Secondary | ICD-10-CM

## 2023-06-22 DIAGNOSIS — L97911 Non-pressure chronic ulcer of unspecified part of right lower leg limited to breakdown of skin: Secondary | ICD-10-CM | POA: Diagnosis not present

## 2023-06-22 NOTE — Progress Notes (Signed)
Office Visit Note   Patient: Evan [name]           Date of Birth: 07-23-1965           MRN: [id number] Visit Date: 06/22/2023              Requested by: [name], Evan Radon, MD [address] [place],  [place] [address] PCP: [name], Evan Radon, MD  Chief Complaint  Patient presents with   Left Leg - Follow-up   Right Leg - Follow-up      HPI: Patient is a 58 year old gentleman who presents in follow-up for ulcerations both lower extremities.  Patient has been showing improvement with the Dynaflex wraps.  Patient's most recent cardiac evaluation shows worsening of his congestive heart failure with an ejection fraction of 20%.  Patient states he was supposed to pick up medication to improve cardiac output this has not been accomplished yet.  Patient is on Eliquis.  Assessment & Plan: Visit Diagnoses:  1. 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 Ulcers of both lower extremities, limited to breakdown of skin (HCC)   3. Chronic combined systolic and diastolic congestive heart failure (HCC)     Plan: Will apply donated Kerecis to the left medial malleolar ulcer.  Dynaflex wraps bilaterally.  Patient is in a difficult situation he has mixed venous and arterial insufficiency to both lower extremities with bilateral ulcers.  Follow-Up Instructions: Return in about 1 week (around 06/29/2023).   Ortho Exam  Patient is alert, oriented, no adenopathy, well-dressed, normal affect, normal respiratory effort. Examination the ulcers on both legs have healthy bleeding granulation tissue.  Silver nitrate was used for hemostasis.  There is an ischemic ulcer over the medial malleolus and we will apply donated Kerecis graft to this on the left.  Patient's most recent ABIs do show multiphasic flow with great toe pressures greater than 60.  Patient does have slow capillary refill at this time.  No palpable pulses.  Most recent vascular  studies in December.  Imaging: No results found.   Labs: Lab Results  Component Value Date   HGBA1C 7.3 (H) 04/19/2023   HGBA1C 7.4 (H) 02/11/2023   HGBA1C 7.6 (H) 11/10/2022   ESRSEDRATE 3 05/05/2023   ESRSEDRATE 1 05/05/2023   CRP 2.0 (H) 05/05/2023   CRP 2.5 (H) 05/05/2023   LABURIC 7.1 10/12/2019   LABURIC 6.1 11/24/2018   LABURIC 5.9 02/04/2017   REPTSTATUS 05/10/2023 FINAL 05/05/2023   CULT  05/05/2023    NO GROWTH 5 DAYS Performed at [facility], [address], [place], [place] [address]      Lab Results  Component Value Date   ALBUMIN 2.9 (L) 06/10/2023   ALBUMIN 3.1 (L) 05/28/2023   ALBUMIN 3.0 (L) 05/27/2023   PREALBUMIN 12 (L) 05/05/2023    Lab Results  Component Value Date   MG 2.2 06/02/2023   MG 2.0 06/01/2023   MG 2.2 05/31/2023   No results found for: "VD25OH"  Lab Results  Component Value Date   PREALBUMIN 12 (L) 05/05/2023      Latest Ref Rng & Units 06/01/2023    7:17 AM 05/31/2023    4:44 AM 05/30/2023    4:15 AM  CBC EXTENDED  WBC 4.0 - 10.5 K/uL 10.0  10.3  11.7   RBC 4.22 - 5.81 MIL/uL 5.62  5.62  5.37   Hemoglobin 13.0 - 17.0 g/dL 70.3  50.0  14.1   HCT 39.0 - 52.0 % 45.0  44.8  42.7   Platelets 150 - 400 K/uL 254  244  255   NEUT# 1.7 - 7.7 K/uL   9.3   Lymph# 0.7 - 4.0 K/uL   1.1      There is no height or weight on file to calculate BMI.  Orders:  No orders of the defined types were placed in this encounter.  No orders of the defined types were placed in this encounter.    Procedures: No procedures performed  Clinical Data: No additional findings.  ROS:  All other systems negative, except as noted in the HPI. Review of Systems  Objective: Vital Signs: There were no vitals taken for this visit.  Specialty Comments:  No specialty comments available.  PMFS History: Patient Active Problem List   Diagnosis Date Noted   Polysubstance abuse (HCC) 06/17/2023   Chronic venous hypertension (idiopathic)  with ulcer and inflammation of bilateral lower extremity (HCC) 05/28/2023   Lymphedema 05/28/2023   Nonsustained ventricular tachycardia (HCC) 05/28/2023   History of coronary artery stent placement 05/28/2023   Left ventricular thrombus 05/28/2023   Pulmonary hypertension, unspecified (HCC) 05/28/2023   OSA (obstructive sleep apnea) 05/28/2023   Cocaine use 05/28/2023   Atherosclerosis of native coronary artery of native heart without angina pectoris 05/28/2023   Biventricular heart failure (HCC) 05/28/2023   Acute decompensated heart failure (HCC) 05/28/2023   Acute HFrEF (heart failure with reduced ejection fraction) (HCC) 05/27/2023   Diabetic foot ulcer (HCC) 05/05/2023   History of acute cholecystitis 04/18/23 treated conservatively 05/05/2023   Acute on chronic HFrEF (heart failure with reduced ejection fraction) (HCC) 05/05/2023   Diabetic ulcer of lower leg (HCC) 05/05/2023   Cellulitis in diabetic foot (HCC) 05/05/2023   CHF (congestive heart failure) (HCC) 05/05/2023   Bilateral leg ulcer (HCC) 05/05/2023   Acute on chronic combined systolic and diastolic CHF (congestive heart failure) (HCC) 05/05/2023   Vasculitis (HCC) 05/05/2023   Abdominal pain 04/19/2023   Elevated brain natriuretic peptide (BNP) level 04/19/2023   Elevated troponin 04/19/2023   Elevated d-dimer 04/19/2023   Type 2 diabetes mellitus with hyperglycemia (HCC) 04/19/2023   Acute cholecystitis 04/18/2023   Hypertension 10/06/2022   Type 2 diabetes mellitus with complications (HCC) 02/15/2021   CAD S/P PCI 12/09/2018   Ischemic cardiomyopathy 12/09/2018   Left ventricular dysfunction    History of non-ST elevation myocardial infarction (NSTEMI) 11/24/2018   Obesity (BMI 30.0-34.9) 01/05/2018   GERD (gastroesophageal reflux disease) 07/08/2017   Mixed hyperlipidemia 12/22/2016   Essential hypertension 09/22/2016   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) 09/22/2016   Gout 09/22/2016   Past  Medical History:  Diagnosis Date   Arthritis    Atrial fibrillation (HCC)    CHF (congestive heart failure) (HCC)    Diabetes mellitus without complication (HCC)    Gout    Heart attack (HCC) 10/2018   Hyperlipidemia    Hypertension    Morbid obesity (HCC) 12/22/2016   Sleep apnea    had test twice unable to complete    Family History  Problem Relation Age of Onset   Diabetes Mother    Stroke Mother    Early death Father    Heart attack Father        died from heart attack at the age of 43   Alcohol abuse Father    Heart disease Father    Breast cancer Sister    Hypertension Brother  Heart attack Maternal Grandfather        died from heart attack at the age of 83   Early death Paternal Grandfather    Heart attack Paternal Grandfather    Colon cancer Neg Hx    Esophageal cancer Neg Hx    Rectal cancer Neg Hx    Stomach cancer Neg Hx     Past Surgical History:  Procedure Laterality Date   ANKLE ARTHROSCOPY Right    COLONOSCOPY WITH PROPOFOL N/A 09/18/2020   Procedure: COLONOSCOPY WITH PROPOFOL;  Surgeon: Sherrilyn Rist, MD;  Location: [facility];  Service: Gastroenterology;  Laterality: N/A;  09-18-2020   CORONARY STENT INTERVENTION N/A 11/24/2018   Procedure: CORONARY STENT INTERVENTION;  Surgeon: Iran Ouch, MD;  [facility];  Service: Cardiovascular;  Laterality: N/A;  prox and mid LAD   CORONARY STENT INTERVENTION N/A 11/26/2018   Procedure: CORONARY STENT INTERVENTION;  Surgeon: Iran Ouch, MD;  [facility];  Service: Cardiovascular;  Laterality: N/A;   CYST REMOVAL HAND     KNEE ARTHROSCOPY Left    LEFT HEART CATH AND CORONARY ANGIOGRAPHY N/A 11/24/2018   Procedure: LEFT HEART CATH AND CORONARY ANGIOGRAPHY;  Surgeon: Iran Ouch, MD;  [facility];  Service: Cardiovascular;  Laterality: N/A;   POLYPECTOMY  09/18/2020   Procedure: POLYPECTOMY;  Surgeon: Sherrilyn Rist, MD;  Location: [facility];   Service: Gastroenterology;;   Social History   Occupational History   Occupation: Disability  Tobacco Use   Smoking status: Never   Smokeless tobacco: Never  Vaping Use   Vaping status: Never Used  Substance and Sexual Activity   Alcohol use: Not Currently    Alcohol/week: 12.0 standard drinks of alcohol    Types: 12 Cans of beer per week   Drug use: No   Sexual activity: Yes    Comment: male 8 months partner

## 2023-06-23 ENCOUNTER — Other Ambulatory Visit: Payer: Self-pay | Admitting: Family Medicine

## 2023-06-24 ENCOUNTER — Ambulatory Visit: Payer: Medicaid Other

## 2023-06-24 DIAGNOSIS — L84 Corns and callosities: Secondary | ICD-10-CM

## 2023-06-24 DIAGNOSIS — E11628 Type 2 diabetes mellitus with other skin complications: Secondary | ICD-10-CM

## 2023-06-24 DIAGNOSIS — I110 Hypertensive heart disease with heart failure: Secondary | ICD-10-CM

## 2023-06-24 DIAGNOSIS — L97821 Non-pressure chronic ulcer of other part of left lower leg limited to breakdown of skin: Secondary | ICD-10-CM

## 2023-06-24 DIAGNOSIS — I5042 Chronic combined systolic (congestive) and diastolic (congestive) heart failure: Secondary | ICD-10-CM | POA: Diagnosis not present

## 2023-06-24 DIAGNOSIS — I5082 Biventricular heart failure: Secondary | ICD-10-CM

## 2023-06-24 DIAGNOSIS — I4891 Unspecified atrial fibrillation: Secondary | ICD-10-CM

## 2023-06-24 DIAGNOSIS — I87333 Chronic venous hypertension (idiopathic) with ulcer and inflammation of bilateral lower extremity: Secondary | ICD-10-CM

## 2023-06-24 DIAGNOSIS — L97811 Non-pressure chronic ulcer of other part of right lower leg limited to breakdown of skin: Secondary | ICD-10-CM

## 2023-06-24 DIAGNOSIS — I272 Pulmonary hypertension, unspecified: Secondary | ICD-10-CM

## 2023-06-24 DIAGNOSIS — I4729 Other ventricular tachycardia: Secondary | ICD-10-CM

## 2023-06-24 DIAGNOSIS — I776 Arteritis, unspecified: Secondary | ICD-10-CM

## 2023-06-26 DIAGNOSIS — I776 Arteritis, unspecified: Secondary | ICD-10-CM | POA: Diagnosis not present

## 2023-06-26 DIAGNOSIS — M109 Gout, unspecified: Secondary | ICD-10-CM | POA: Diagnosis not present

## 2023-06-26 DIAGNOSIS — L97811 Non-pressure chronic ulcer of other part of right lower leg limited to breakdown of skin: Secondary | ICD-10-CM | POA: Diagnosis not present

## 2023-06-26 DIAGNOSIS — G4733 Obstructive sleep apnea (adult) (pediatric): Secondary | ICD-10-CM | POA: Diagnosis not present

## 2023-06-26 DIAGNOSIS — M1990 Unspecified osteoarthritis, unspecified site: Secondary | ICD-10-CM | POA: Diagnosis not present

## 2023-06-26 DIAGNOSIS — L84 Corns and callosities: Secondary | ICD-10-CM | POA: Diagnosis not present

## 2023-06-26 DIAGNOSIS — I87333 Chronic venous hypertension (idiopathic) with ulcer and inflammation of bilateral lower extremity: Secondary | ICD-10-CM | POA: Diagnosis not present

## 2023-06-26 DIAGNOSIS — I4729 Other ventricular tachycardia: Secondary | ICD-10-CM | POA: Diagnosis not present

## 2023-06-26 DIAGNOSIS — I5042 Chronic combined systolic (congestive) and diastolic (congestive) heart failure: Secondary | ICD-10-CM | POA: Diagnosis not present

## 2023-06-26 DIAGNOSIS — Z79899 Other long term (current) drug therapy: Secondary | ICD-10-CM | POA: Diagnosis not present

## 2023-06-26 DIAGNOSIS — I2510 Atherosclerotic heart disease of native coronary artery without angina pectoris: Secondary | ICD-10-CM | POA: Diagnosis not present

## 2023-06-26 DIAGNOSIS — I890 Lymphedema, not elsewhere classified: Secondary | ICD-10-CM | POA: Diagnosis not present

## 2023-06-26 DIAGNOSIS — K219 Gastro-esophageal reflux disease without esophagitis: Secondary | ICD-10-CM | POA: Diagnosis not present

## 2023-06-26 DIAGNOSIS — Z7982 Long term (current) use of aspirin: Secondary | ICD-10-CM | POA: Diagnosis not present

## 2023-06-26 DIAGNOSIS — I110 Hypertensive heart disease with heart failure: Secondary | ICD-10-CM | POA: Diagnosis not present

## 2023-06-26 DIAGNOSIS — I4891 Unspecified atrial fibrillation: Secondary | ICD-10-CM | POA: Diagnosis not present

## 2023-06-26 DIAGNOSIS — I272 Pulmonary hypertension, unspecified: Secondary | ICD-10-CM | POA: Diagnosis not present

## 2023-06-26 DIAGNOSIS — L97821 Non-pressure chronic ulcer of other part of left lower leg limited to breakdown of skin: Secondary | ICD-10-CM | POA: Diagnosis not present

## 2023-06-26 DIAGNOSIS — I5082 Biventricular heart failure: Secondary | ICD-10-CM | POA: Diagnosis not present

## 2023-06-26 DIAGNOSIS — I255 Ischemic cardiomyopathy: Secondary | ICD-10-CM | POA: Diagnosis not present

## 2023-06-26 DIAGNOSIS — E11628 Type 2 diabetes mellitus with other skin complications: Secondary | ICD-10-CM | POA: Diagnosis not present

## 2023-06-26 DIAGNOSIS — E782 Mixed hyperlipidemia: Secondary | ICD-10-CM | POA: Diagnosis not present

## 2023-06-26 DIAGNOSIS — I252 Old myocardial infarction: Secondary | ICD-10-CM | POA: Diagnosis not present

## 2023-06-29 ENCOUNTER — Ambulatory Visit: Payer: Medicaid Other | Admitting: Orthopedic Surgery

## 2023-06-29 DIAGNOSIS — I87333 Chronic venous hypertension (idiopathic) with ulcer and inflammation of bilateral lower extremity: Secondary | ICD-10-CM

## 2023-06-30 ENCOUNTER — Telehealth: Payer: Self-pay | Admitting: Cardiology

## 2023-06-30 NOTE — Telephone Encounter (Signed)
Pt confirmed appt for 07/01/23

## 2023-07-01 ENCOUNTER — Encounter: Payer: Medicaid Other | Admitting: Cardiology

## 2023-07-02 ENCOUNTER — Other Ambulatory Visit (HOSPITAL_COMMUNITY): Payer: Self-pay

## 2023-07-06 ENCOUNTER — Ambulatory Visit: Payer: Medicaid Other | Admitting: Orthopedic Surgery

## 2023-07-07 ENCOUNTER — Encounter: Payer: Self-pay | Admitting: Orthopedic Surgery

## 2023-07-07 ENCOUNTER — Ambulatory Visit (INDEPENDENT_AMBULATORY_CARE_PROVIDER_SITE_OTHER): Payer: Medicaid Other | Admitting: Orthopedic Surgery

## 2023-07-07 DIAGNOSIS — I890 Lymphedema, not elsewhere classified: Secondary | ICD-10-CM | POA: Diagnosis not present

## 2023-07-07 DIAGNOSIS — L97911 Non-pressure chronic ulcer of unspecified part of right lower leg limited to breakdown of skin: Secondary | ICD-10-CM

## 2023-07-07 DIAGNOSIS — L97921 Non-pressure chronic ulcer of unspecified part of left lower leg limited to breakdown of skin: Secondary | ICD-10-CM | POA: Diagnosis not present

## 2023-07-07 DIAGNOSIS — I87333 Chronic venous hypertension (idiopathic) with ulcer and inflammation of bilateral lower extremity: Secondary | ICD-10-CM

## 2023-07-07 DIAGNOSIS — I5042 Chronic combined systolic (congestive) and diastolic (congestive) heart failure: Secondary | ICD-10-CM | POA: Diagnosis not present

## 2023-07-07 NOTE — Progress Notes (Signed)
 Office Visit Note   Patient: Evan [name]           Date of Birth: 30-May-1965           MRN: [id number] Visit Date: 07/07/2023              Requested by: Dettinger, Elige Radon, MD [address] [place],  [place] [address] PCP: Dettinger, Elige Radon, MD  Chief Complaint  Patient presents with   Right Leg - Wound Check   Left Leg - Wound Check      HPI: Patient is a 58 year old gentleman is seen in follow-up for venous ulcerations both lower extremities.  Patient has been in Dynaflex compression wrap and has received Kerecis micro graft.  Assessment & Plan: Visit Diagnoses:  1. 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 Ulcers of both lower extremities, limited to breakdown of skin (HCC)   3. Chronic combined systolic and diastolic congestive heart failure (HCC)   4. Lymphedema     Plan: The wounds continue to improve the wound beds are stable.  We will have him advance to a Vive wear compression sock he will wear this around-the-clock change daily wash with soap and water in the shower daily  Follow-Up Instructions: Return in about 1 week (around 07/14/2023).   Ortho Exam  Patient is alert, oriented, no adenopathy, well-dressed, normal affect, normal respiratory effort. Examination of the swelling has decreased the wound beds have healthy granulation tissue.  There is no cellulitis.  Imaging: No results found.     Labs: Lab Results  Component Value Date   HGBA1C 7.3 (H) 04/19/2023   HGBA1C 7.4 (H) 02/11/2023   HGBA1C 7.6 (H) 11/10/2022   ESRSEDRATE 3 05/05/2023   ESRSEDRATE 1 05/05/2023   CRP 2.0 (H) 05/05/2023   CRP 2.5 (H) 05/05/2023   LABURIC 7.1 10/12/2019   LABURIC 6.1 11/24/2018   LABURIC 5.9 02/04/2017   REPTSTATUS 05/10/2023 FINAL 05/05/2023   CULT  05/05/2023    NO GROWTH 5 DAYS Performed at [facility], [address], [place], [place] [address]      Lab Results  Component  Value Date   ALBUMIN 2.9 (L) 06/10/2023   ALBUMIN 3.1 (L) 05/28/2023   ALBUMIN 3.0 (L) 05/27/2023   PREALBUMIN 12 (L) 05/05/2023    Lab Results  Component Value Date   MG 2.2 06/02/2023   MG 2.0 06/01/2023   MG 2.2 05/31/2023   No results found for: "VD25OH"  Lab Results  Component Value Date   PREALBUMIN 12 (L) 05/05/2023      Latest Ref Rng & Units 06/01/2023    7:17 AM 05/31/2023    4:44 AM 05/30/2023    4:15 AM  CBC EXTENDED  WBC 4.0 - 10.5 K/uL 10.0  10.3  11.7   RBC 4.22 - 5.81 MIL/uL 5.62  5.62  5.37   Hemoglobin 13.0 - 17.0 g/dL 25.3  66.4  40.3   HCT 39.0 - 52.0 % 45.0  44.8  42.7   Platelets 150 - 400 K/uL 254  244  255   NEUT# 1.7 - 7.7 K/uL   9.3   Lymph# 0.7 - 4.0 K/uL   1.1      There is no height or weight on file to calculate BMI.  Orders:  No orders of the defined types were placed in this encounter.  No orders of the defined types were placed in this encounter.  Procedures: No procedures performed  Clinical Data: No additional findings.  ROS:  All other systems negative, except as noted in the HPI. Review of Systems  Objective: Vital Signs: There were no vitals taken for this visit.  Specialty Comments:  No specialty comments available.  PMFS History: Patient Active Problem List   Diagnosis Date Noted   Polysubstance abuse (HCC) 06/17/2023   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) 05/28/2023   Lymphedema 05/28/2023   Nonsustained ventricular tachycardia (HCC) 05/28/2023   History of coronary artery stent placement 05/28/2023   Left ventricular thrombus 05/28/2023   Pulmonary hypertension, unspecified (HCC) 05/28/2023   OSA (obstructive sleep apnea) 05/28/2023   Cocaine use 05/28/2023   Atherosclerosis of native coronary artery of native heart without angina pectoris 05/28/2023   Biventricular heart failure (HCC) 05/28/2023   Acute decompensated heart failure (HCC)  05/28/2023   Acute HFrEF (heart failure with reduced ejection fraction) (HCC) 05/27/2023   Diabetic foot ulcer (HCC) 05/05/2023   History of acute cholecystitis 04/18/23 treated conservatively 05/05/2023   Acute on chronic HFrEF (heart failure with reduced ejection fraction) (HCC) 05/05/2023   Diabetic ulcer of lower leg (HCC) 05/05/2023   Cellulitis in diabetic foot (HCC) 05/05/2023   CHF (congestive heart failure) (HCC) 05/05/2023   Bilateral leg ulcer (HCC) 05/05/2023   Acute on chronic combined systolic and diastolic CHF (congestive heart failure) (HCC) 05/05/2023   Vasculitis (HCC) 05/05/2023   Abdominal pain 04/19/2023   Elevated brain natriuretic peptide (BNP) level 04/19/2023   Elevated troponin 04/19/2023   Elevated d-dimer 04/19/2023   Type 2 diabetes mellitus with hyperglycemia (HCC) 04/19/2023   Acute cholecystitis 04/18/2023   Hypertension 10/06/2022   Type 2 diabetes mellitus with complications (HCC) 02/15/2021   CAD S/P PCI 12/09/2018   Ischemic cardiomyopathy 12/09/2018   Left ventricular dysfunction    History of non-ST elevation myocardial infarction (NSTEMI) 11/24/2018   Obesity (BMI 30.0-34.9) 01/05/2018   GERD (gastroesophageal reflux disease) 07/08/2017   Mixed hyperlipidemia 12/22/2016   Essential hypertension 09/22/2016   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) 09/22/2016   Gout 09/22/2016   Past Medical History:  Diagnosis Date   Arthritis    Atrial fibrillation (HCC)    CHF (congestive heart failure) (HCC)    Diabetes mellitus without complication (HCC)    Gout    Heart attack (HCC) 10/2018   Hyperlipidemia    Hypertension    Morbid obesity (HCC) 12/22/2016   Sleep apnea    had test twice unable to complete    Family History  Problem Relation Age of Onset   Diabetes Mother    Stroke Mother    Early death Father    Heart attack Father        died from heart attack at the age of 3   Alcohol abuse Father    Heart disease Father     Breast cancer Sister    Hypertension Brother    Heart attack Maternal Grandfather        died from heart attack at the age of 73   Early death Paternal Grandfather    Heart attack Paternal Grandfather    Colon cancer Neg Hx    Esophageal cancer Neg Hx    Rectal cancer Neg Hx    Stomach cancer Neg Hx     Past Surgical History:  Procedure Laterality Date   ANKLE ARTHROSCOPY Right    COLONOSCOPY WITH PROPOFOL N/A 09/18/2020  Procedure: COLONOSCOPY WITH PROPOFOL;  Surgeon: Sherrilyn Rist, MD;  Location: [facility];  Service: Gastroenterology;  Laterality: N/A;  09-18-2020   CORONARY STENT INTERVENTION N/A 11/24/2018   Procedure: CORONARY STENT INTERVENTION;  Surgeon: Iran Ouch, MD;  [facility];  Service: Cardiovascular;  Laterality: N/A;  prox and mid LAD   CORONARY STENT INTERVENTION N/A 11/26/2018   Procedure: CORONARY STENT INTERVENTION;  Surgeon: Iran Ouch, MD;  [facility];  Service: Cardiovascular;  Laterality: N/A;   CYST REMOVAL HAND     KNEE ARTHROSCOPY Left    LEFT HEART CATH AND CORONARY ANGIOGRAPHY N/A 11/24/2018   Procedure: LEFT HEART CATH AND CORONARY ANGIOGRAPHY;  Surgeon: Iran Ouch, MD;  [facility];  Service: Cardiovascular;  Laterality: N/A;   POLYPECTOMY  09/18/2020   Procedure: POLYPECTOMY;  Surgeon: Sherrilyn Rist, MD;  Location: [facility];  Service: Gastroenterology;;   Social History   Occupational History   Occupation: Disability  Tobacco Use   Smoking status: Never   Smokeless tobacco: Never  Vaping Use   Vaping status: Never Used  Substance and Sexual Activity   Alcohol use: Not Currently    Alcohol/week: 12.0 standard drinks of alcohol    Types: 12 Cans of beer per week   Drug use: No   Sexual activity: Yes    Comment: male 8 months partner

## 2023-07-09 ENCOUNTER — Encounter: Payer: Self-pay | Admitting: Orthopedic Surgery

## 2023-07-09 NOTE — Progress Notes (Signed)
 Office Visit Note   Patient: Evan [name]           Date of Birth: 1965-10-07           MRN: [id number] Visit Date: 06/29/2023              Requested by: Dettinger, Elige Radon, MD [address] [place],  [place] [address] PCP: Dettinger, Elige Radon, MD  Chief Complaint  Patient presents with   Left Leg - Follow-up   Right Leg - Follow-up      HPI: Patient is a 58 year old gentleman who is seen for venous insufficiency ulceration.  Donated Kerecis was applied last office visit to the left medial malleolus.  Assessment & Plan: Visit Diagnoses:  1. 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)     Plan: Ulcers continue to and heal.  Will apply the Dynaflex compression wraps bilaterally.  Follow-Up Instructions: Return in about 1 week (around 07/06/2023).   Ortho Exam  Patient is alert, oriented, no adenopathy, well-dressed, normal affect, normal respiratory effort. Examination patient has multiple venous ulcerations both lower extremities.  The swelling is resolving the skin is wrinkling there is improved granulation tissue in the wound bed.  The target ulcer is 2.5 x 3 cm with healthy granulation tissue.  Imaging: No results found.      Labs: Lab Results  Component Value Date   HGBA1C 7.3 (H) 04/19/2023   HGBA1C 7.4 (H) 02/11/2023   HGBA1C 7.6 (H) 11/10/2022   ESRSEDRATE 3 05/05/2023   ESRSEDRATE 1 05/05/2023   CRP 2.0 (H) 05/05/2023   CRP 2.5 (H) 05/05/2023   LABURIC 7.1 10/12/2019   LABURIC 6.1 11/24/2018   LABURIC 5.9 02/04/2017   REPTSTATUS 05/10/2023 FINAL 05/05/2023   CULT  05/05/2023    NO GROWTH 5 DAYS Performed at [facility], [address] [place], [place] [address]      Lab Results  Component Value Date   ALBUMIN 2.9 (L) 06/10/2023   ALBUMIN 3.1 (L) 05/28/2023   ALBUMIN 3.0 (L) 05/27/2023   PREALBUMIN 12 (L) 05/05/2023    Lab Results  Component Value Date   MG 2.2 06/02/2023    MG 2.0 06/01/2023   MG 2.2 05/31/2023   No results found for: "VD25OH"  Lab Results  Component Value Date   PREALBUMIN 12 (L) 05/05/2023      Latest Ref Rng & Units 06/01/2023    7:17 AM 05/31/2023    4:44 AM 05/30/2023    4:15 AM  CBC EXTENDED  WBC 4.0 - 10.5 K/uL 10.0  10.3  11.7   RBC 4.22 - 5.81 MIL/uL 5.62  5.62  5.37   Hemoglobin 13.0 - 17.0 g/dL 01.0  27.2  53.6   HCT 39.0 - 52.0 % 45.0  44.8  42.7   Platelets 150 - 400 K/uL 254  244  255   NEUT# 1.7 - 7.7 K/uL   9.3   Lymph# 0.7 - 4.0 K/uL   1.1      There is no height or weight on file to calculate BMI.  Orders:  No orders of the defined types were placed in this encounter.  No orders of the defined types were placed in this encounter.    Procedures: No procedures performed  Clinical Data: No additional findings.  ROS:  All other systems negative, except as noted in the HPI. Review of Systems  Objective: Vital Signs: There were no vitals taken  for this visit.  Specialty Comments:  No specialty comments available.  PMFS History: Patient Active Problem List   Diagnosis Date Noted   Polysubstance abuse (HCC) 06/17/2023   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) 05/28/2023   Lymphedema 05/28/2023   Nonsustained ventricular tachycardia (HCC) 05/28/2023   History of coronary artery stent placement 05/28/2023   Left ventricular thrombus 05/28/2023   Pulmonary hypertension, unspecified (HCC) 05/28/2023   OSA (obstructive sleep apnea) 05/28/2023   Cocaine use 05/28/2023   Atherosclerosis of native coronary artery of native heart without angina pectoris 05/28/2023   Biventricular heart failure (HCC) 05/28/2023   Acute decompensated heart failure (HCC) 05/28/2023   Acute HFrEF (heart failure with reduced ejection fraction) (HCC) 05/27/2023   Diabetic foot ulcer (HCC) 05/05/2023   History of acute cholecystitis 04/18/23 treated  conservatively 05/05/2023   Acute on chronic HFrEF (heart failure with reduced ejection fraction) (HCC) 05/05/2023   Diabetic ulcer of lower leg (HCC) 05/05/2023   Cellulitis in diabetic foot (HCC) 05/05/2023   CHF (congestive heart failure) (HCC) 05/05/2023   Bilateral leg ulcer (HCC) 05/05/2023   Acute on chronic combined systolic and diastolic CHF (congestive heart failure) (HCC) 05/05/2023   Vasculitis (HCC) 05/05/2023   Abdominal pain 04/19/2023   Elevated brain natriuretic peptide (BNP) level 04/19/2023   Elevated troponin 04/19/2023   Elevated d-dimer 04/19/2023   Type 2 diabetes mellitus with hyperglycemia (HCC) 04/19/2023   Acute cholecystitis 04/18/2023   Hypertension 10/06/2022   Type 2 diabetes mellitus with complications (HCC) 02/15/2021   CAD S/P PCI 12/09/2018   Ischemic cardiomyopathy 12/09/2018   Left ventricular dysfunction    History of non-ST elevation myocardial infarction (NSTEMI) 11/24/2018   Obesity (BMI 30.0-34.9) 01/05/2018   GERD (gastroesophageal reflux disease) 07/08/2017   Mixed hyperlipidemia 12/22/2016   Essential hypertension 09/22/2016   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) 09/22/2016   Gout 09/22/2016   Past Medical History:  Diagnosis Date   Arthritis    Atrial fibrillation (HCC)    CHF (congestive heart failure) (HCC)    Diabetes mellitus without complication (HCC)    Gout    Heart attack (HCC) 10/2018   Hyperlipidemia    Hypertension    Morbid obesity (HCC) 12/22/2016   Sleep apnea    had test twice unable to complete    Family History  Problem Relation Age of Onset   Diabetes Mother    Stroke Mother    Early death Father    Heart attack Father        died from heart attack at the age of 21   Alcohol abuse Father    Heart disease Father    Breast cancer Sister    Hypertension Brother    Heart attack Maternal Grandfather        died from heart attack at the age of 7   Early death Paternal Grandfather    Heart  attack Paternal Grandfather    Colon cancer Neg Hx    Esophageal cancer Neg Hx    Rectal cancer Neg Hx    Stomach cancer Neg Hx     Past Surgical History:  Procedure Laterality Date   ANKLE ARTHROSCOPY Right    COLONOSCOPY WITH PROPOFOL N/A 09/18/2020   Procedure: COLONOSCOPY WITH PROPOFOL;  Surgeon: Sherrilyn Rist, MD;  Location: [facility];  Service: Gastroenterology;  Laterality: N/A;  09-18-2020   CORONARY STENT INTERVENTION N/A 11/24/2018   Procedure: CORONARY STENT  INTERVENTION;  Surgeon: Iran Ouch, MD;  [facility];  Service: Cardiovascular;  Laterality: N/A;  prox and mid LAD   CORONARY STENT INTERVENTION N/A 11/26/2018   Procedure: CORONARY STENT INTERVENTION;  Surgeon: Iran Ouch, MD;  [facility];  Service: Cardiovascular;  Laterality: N/A;   CYST REMOVAL HAND     KNEE ARTHROSCOPY Left    LEFT HEART CATH AND CORONARY ANGIOGRAPHY N/A 11/24/2018   Procedure: LEFT HEART CATH AND CORONARY ANGIOGRAPHY;  Surgeon: Iran Ouch, MD;  [facility];  Service: Cardiovascular;  Laterality: N/A;   POLYPECTOMY  09/18/2020   Procedure: POLYPECTOMY;  Surgeon: Sherrilyn Rist, MD;  Location: [facility];  Service: Gastroenterology;;   Social History   Occupational History   Occupation: Disability  Tobacco Use   Smoking status: Never   Smokeless tobacco: Never  Vaping Use   Vaping status: Never Used  Substance and Sexual Activity   Alcohol use: Not Currently    Alcohol/week: 12.0 standard drinks of alcohol    Types: 12 Cans of beer per week   Drug use: No   Sexual activity: Yes    Comment: male 8 months partner

## 2023-07-10 ENCOUNTER — Telehealth: Payer: Self-pay | Admitting: Cardiology

## 2023-07-10 NOTE — Telephone Encounter (Signed)
 Pt confirmed appt for 07/13/23

## 2023-07-13 ENCOUNTER — Ambulatory Visit (HOSPITAL_BASED_OUTPATIENT_CLINIC_OR_DEPARTMENT_OTHER): Payer: Medicaid Other | Admitting: Cardiology

## 2023-07-13 ENCOUNTER — Encounter: Payer: Self-pay | Admitting: Cardiology

## 2023-07-13 ENCOUNTER — Other Ambulatory Visit
Admission: RE | Admit: 2023-07-13 | Discharge: 2023-07-13 | Disposition: A | Discharge status: Home / Self Care | Payer: Medicaid Other | Source: Ambulatory Visit | Attending: Cardiology | Admitting: Cardiology

## 2023-07-13 ENCOUNTER — Other Ambulatory Visit: Payer: Self-pay

## 2023-07-13 VITALS — BP 113/93 | HR 100 | Wt 245.8 lb

## 2023-07-13 DIAGNOSIS — I519 Heart disease, unspecified: Secondary | ICD-10-CM

## 2023-07-13 DIAGNOSIS — G4733 Obstructive sleep apnea (adult) (pediatric): Secondary | ICD-10-CM | POA: Insufficient documentation

## 2023-07-13 DIAGNOSIS — I513 Intracardiac thrombosis, not elsewhere classified: Secondary | ICD-10-CM | POA: Diagnosis not present

## 2023-07-13 DIAGNOSIS — I2510 Atherosclerotic heart disease of native coronary artery without angina pectoris: Secondary | ICD-10-CM | POA: Diagnosis not present

## 2023-07-13 DIAGNOSIS — E1151 Type 2 diabetes mellitus with diabetic peripheral angiopathy without gangrene: Secondary | ICD-10-CM | POA: Insufficient documentation

## 2023-07-13 DIAGNOSIS — Z79899 Other long term (current) drug therapy: Secondary | ICD-10-CM | POA: Insufficient documentation

## 2023-07-13 DIAGNOSIS — I5022 Chronic systolic (congestive) heart failure: Secondary | ICD-10-CM

## 2023-07-13 DIAGNOSIS — Z794 Long term (current) use of insulin: Secondary | ICD-10-CM | POA: Diagnosis not present

## 2023-07-13 DIAGNOSIS — I872 Venous insufficiency (chronic) (peripheral): Secondary | ICD-10-CM | POA: Diagnosis not present

## 2023-07-13 DIAGNOSIS — Z7984 Long term (current) use of oral hypoglycemic drugs: Secondary | ICD-10-CM | POA: Diagnosis not present

## 2023-07-13 DIAGNOSIS — I255 Ischemic cardiomyopathy: Secondary | ICD-10-CM | POA: Diagnosis not present

## 2023-07-13 DIAGNOSIS — L97909 Non-pressure chronic ulcer of unspecified part of unspecified lower leg with unspecified severity: Secondary | ICD-10-CM | POA: Diagnosis not present

## 2023-07-13 DIAGNOSIS — Z955 Presence of coronary angioplasty implant and graft: Secondary | ICD-10-CM | POA: Insufficient documentation

## 2023-07-13 DIAGNOSIS — Z5181 Encounter for therapeutic drug level monitoring: Secondary | ICD-10-CM | POA: Diagnosis not present

## 2023-07-13 DIAGNOSIS — I110 Hypertensive heart disease with heart failure: Secondary | ICD-10-CM | POA: Insufficient documentation

## 2023-07-13 DIAGNOSIS — I480 Paroxysmal atrial fibrillation: Secondary | ICD-10-CM | POA: Insufficient documentation

## 2023-07-13 DIAGNOSIS — Z91148 Patient's other noncompliance with medication regimen for other reason: Secondary | ICD-10-CM | POA: Diagnosis not present

## 2023-07-13 DIAGNOSIS — Z7982 Long term (current) use of aspirin: Secondary | ICD-10-CM | POA: Insufficient documentation

## 2023-07-13 DIAGNOSIS — Z7901 Long term (current) use of anticoagulants: Secondary | ICD-10-CM | POA: Diagnosis not present

## 2023-07-13 DIAGNOSIS — T501X6A Underdosing of loop [high-ceiling] diuretics, initial encounter: Secondary | ICD-10-CM | POA: Insufficient documentation

## 2023-07-13 DIAGNOSIS — I428 Other cardiomyopathies: Secondary | ICD-10-CM | POA: Insufficient documentation

## 2023-07-13 DIAGNOSIS — E8779 Other fluid overload: Secondary | ICD-10-CM | POA: Insufficient documentation

## 2023-07-13 DIAGNOSIS — I252 Old myocardial infarction: Secondary | ICD-10-CM | POA: Diagnosis not present

## 2023-07-13 DIAGNOSIS — E785 Hyperlipidemia, unspecified: Secondary | ICD-10-CM | POA: Diagnosis not present

## 2023-07-13 DIAGNOSIS — F1910 Other psychoactive substance abuse, uncomplicated: Secondary | ICD-10-CM | POA: Insufficient documentation

## 2023-07-13 LAB — BASIC METABOLIC PANEL
Anion gap: 13 (ref 5–15)
BUN: 22 mg/dL — ABNORMAL HIGH (ref 6–20)
CO2: 22 mmol/L (ref 22–32)
Calcium: 9 mg/dL (ref 8.9–10.3)
Chloride: 102 mmol/L (ref 98–111)
Creatinine, Ser: 1.22 mg/dL (ref 0.61–1.24)
GFR, Estimated: >60 mL/min (ref >60)
Glucose, Bld: 132 mg/dL — ABNORMAL HIGH (ref 70–99)
Potassium: 3.3 mmol/L — ABNORMAL LOW (ref 3.5–5.1)
Sodium: 137 mmol/L (ref 135–145)

## 2023-07-13 LAB — BRAIN NATRIURETIC PEPTIDE: B Natriuretic Peptide: 1430.9 pg/mL — ABNORMAL HIGH (ref 0.0–100.0)

## 2023-07-13 MED ORDER — TORSEMIDE 20 MG PO TABS
40.0000 mg | ORAL_TABLET | Freq: Two times a day (BID) | ORAL | 3 refills | Status: DC
  Filled 2023-07-13: qty 120, 30d supply, fill #0

## 2023-07-13 MED ORDER — TORSEMIDE 20 MG PO TABS: 40.0000 mg | ORAL_TABLET | Freq: Two times a day (BID) | ORAL | 3 refills | Status: DC

## 2023-07-13 MED ORDER — POTASSIUM CHLORIDE CRYS ER 20 MEQ PO TBCR: 40.0000 meq | EXTENDED_RELEASE_TABLET | Freq: Every day | ORAL | 3 refills | Status: DC

## 2023-07-13 MED ORDER — POTASSIUM CHLORIDE CRYS ER 20 MEQ PO TBCR
40.0000 meq | EXTENDED_RELEASE_TABLET | Freq: Every day | ORAL | 3 refills | Status: DC
  Filled 2023-07-13: qty 60, 30d supply, fill #0

## 2023-07-13 NOTE — Progress Notes (Signed)
 ADVANCED HEART FAILURE CLINIC NOTE  Referring Physician: Dettinger, Elige Radon, MD  Primary Care: Dettinger, Elige Radon, MD   CC: HFREF / volume overload  HPI: Evan [name] is a 58 y.o. 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  Evan [name] was admitted to [facility] in January 2025 after presenting with severe volume overload and leaving AMA from his prior hospitalization.  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  Echocardiogram at that time in December with EF of 20% and LV apical thrombus.  During this time he was also using cocaine.  During his hospitalization he was started on milrinone and required midodrine to maintain normal systolic blood pressure.  He was aggressively diuresed and discharged home on digoxin, Jardiance, midodrine and torsemide.  Interval history - He has only been taking half the prescribed diuretic dosing; presents today with volume overload, 2+ pitting edema to the knees and worsening PND/DOE.   Activity level/exercise tolerance:  NYHA III Orthopnea:  Sleeps on 2-3 pillows Paroxysmal noctural dyspnea:  Yes Chest pain/pressure:  No Orthostatic lightheadedness:  No Palpitations:  No Lower extremity edema:  Yes, 2+ Presyncope/syncope:  No Cough:  No  Current Outpatient Medications  Medication Sig Dispense Refill   acetaminophen (TYLENOL) 325 MG tablet Take 2 tablets (650 mg total) by mouth every 6 (six) hours as needed for mild pain (pain score 1-3) or fever (or Fever >/= 101).     apixaban (ELIQUIS) 5 MG TABS tablet Take 1 tablet (5 mg total) by mouth 2 (two)  times daily. 60 tablet 2   aspirin EC 81 MG tablet Take 1 tablet (81 mg total) by mouth daily with breakfast. Swallow whole. 30 tablet 12   atorvastatin (LIPITOR) 80 MG tablet Take 1 tablet (80 mg total) by mouth daily. 90 tablet 3   BD PEN NEEDLE NANO 2ND GEN 32G X 4 MM MISC Use daily with insulin Dx E11.9, Z79.4 100 each 3   Blood Glucose Monitoring Suppl (ACCU-CHEK AVIVA PLUS) w/Device KIT 1 each by Does not apply route 4 (four) times daily. 1 kit 1   Blood Glucose Monitoring Suppl (ACCU-CHEK AVIVA) device Test BS BID Dx E11.69 1 each 0   digoxin (LANOXIN) 0.125 MG tablet Take 1 tablet (0.125 mg total) by mouth daily. 30 tablet 2   empagliflozin (JARDIANCE) 25 MG TABS tablet Take 1 tablet (25 mg total) by mouth daily. 90 tablet 3   famotidine (PEPCID) 20 MG tablet Take 1 tablet (20 mg total) by mouth 2 (two) times daily. 180 tablet 3   fenofibrate (TRICOR) 145 MG tablet Take 1 tablet (145 mg total) by mouth daily. 90 tablet 3   fluticasone (FLONASE) 50 MCG/ACT nasal spray Place 1 spray into both nostrils 2 (two) times daily as needed for allergies or rhinitis. 16 g 6   glucose blood (ACCU-CHEK GUIDE) test strip Check BS in the morning and at bedtime Dx E11.8 200 strip 3   hydrOXYzine (VISTARIL) 25 MG capsule Take 1 capsule (25 mg total) by mouth every 8 (eight) hours as needed. 30 capsule 2   spironolactone (ALDACTONE) 25 MG tablet Take 0.5 tablets (12.5 mg total) by mouth daily. 45 tablet 3  pantoprazole (PROTONIX) 40 MG tablet Take 40 mg by mouth daily. (Patient not taking: Reported on 07/13/2023)     potassium chloride SA (KLOR-CON M) 20 MEQ tablet Take 2 tablets (40 mEq total) by mouth daily. 60 tablet 3   torsemide (DEMADEX) 20 MG tablet Take 2 tablets (40 mg total) by mouth 2 (two) times daily. 120 tablet 3   No current facility-administered medications for this visit.    PHYSICAL EXAM: Vitals:   07/13/23 1036  BP: (!) 113/93  Pulse: 100  SpO2: 98%   GENERAL: NAD Lungs-  cta CARDIAC:  JVP: 12 cm          Normal rate with regular rhythm. no murmur.  Pulses 2+. 2+ edema.  ABDOMEN: Soft, non-tender, non-distended.  EXTREMITIES: Warm and well perfused.  NEUROLOGIC: No obvious FND   Filed Weights   07/13/23 1036  Weight: 245 lb 12.8 oz (111.5 kg)      DATA REVIEW  ECG: 05/28/23: NSR with old anterior infarct  As per my personal interpretation  ECHO: 05/05/23: LVEF 20%, moderately reduced RV function as per my personal interpretation 4/22: LVEF 35%-40%, normal RV function  CATH: 11/24/18:  1.  Severe three-vessel coronary artery disease.  Occluded ostial and mid LAD likely due to late presenting anterior STEMI.  Significant distal left circumflex disease but the supplied territory is small.  Significant mid RCA stenosis with occluded posterior AV groove with left-to-right collaterals. 2.  Severely reduced LV systolic function with an EF of 20 to 25% with anterior wall akinesis.  LVEDP was 32 mmHg. 3.  Successful PCI and drug-eluting stent placement to the ostial as well as mid LAD.   ASSESSMENT & PLAN:  Heart failure with reduced EF Etiology of ZO:XWRUE ischemic & nonsichemic cardiomyopathy; likely exacerbated by polysubstance abuse.  NYHA class / AHA Stage:NYHA II-III Volume status & Diuretics: Increase torsemide to 60mg  BID x 4 days then 40mg  BID; KCL daily. BMP/BNP today. Follow up in 1 week with pharmD and repeat labs.  Vasodilators: weaned off midodrine at his past visit. Will try to introduce losartan 12.5mg  at bedtime at pharmacy visit.  Beta-Blocker:holding due to low output heart failure; dig , repeat digoxin level today.  AVW:[id number] 25mg  daily Cardiometabolic:jardiance 25mg  daily Devices therapies & Valvulopathies:not currently indicated Advanced therapies:not a candidate currently.   2. CAD - s/p PCI in 11/24/18 to the LAD & RCA with anterior wall hypokinesis.  - continue asa 81mg  daily & lipitor 80mg  daily  - no  chest pain  3. LV thrombus - continue apixaban 5mg  bid  4. Polysubstance abuse - discussed importance of drug cessation; so far he reports doing well with this.   5. Chronic venous insufficency with LE ulcers - Followed by Dr. Lajoyce Corners; last seen on 06/09/23 for massive venous stasis ulcers on both legs.  - Vashe dressings and compression wraps applied with plan for follow up later this week.   Baljit Liebert Advanced Heart Failure Mechanical Circulatory Support

## 2023-07-13 NOTE — Patient Instructions (Addendum)
 Medication Changes:  TAKE TORSEMIDE 60MG  TWICE DAILY FOR 4 DAYS (3) TABLETS TWICE DAILY   THEN START TORSEMIDE 40MG  TWICE DAILY THEREAFTER (2) TABLETS TWICE DAILY   START: POTASSIUM ONCE DAILY (2) TABLETS DAILY   Lab Work:  Go DOWN to LOWER LEVEL (LL) to have your blood work completed inside of [employer] office.  We will only call you if the results are abnormal or if the provider would like to make medication changes.  Follow-Up in: 6 WEEKS AS SCHEDULED   If you have any questions or concerns before your next appointment please send [place] a message through mychart or call our office at [phone] Monday-Friday 8 am-5 pm.   If you have an urgent need after hours on the weekend please call your Primary Cardiologist or the Advanced [facility] Failure Clinic in [place] at [phone].   At the Advanced [facility] Clinic, you and your health needs are our priority. We have a designated team specialized in the treatment of Heart Failure. This Care Team includes your primary Heart Failure Specialized Cardiologist (physician), Advanced Practice Providers (APPs- Physician Assistants and Nurse Practitioners), and Pharmacist who all work together to provide you with the care you need, when you need it.   You may see any of the following providers on your designated Care Team at your next follow up:  Dr. Arvilla Meres Dr. Marca Ancona Dr. Dorthula Nettles Dr. Theresia Bough Tonye Becket, NP Robbie Lis, [place] [address] [place], [place] Brynda Peon, NP [place] Lee, NP Clarisa Kindred, NP Enos Fling, PharmD

## 2023-07-14 ENCOUNTER — Encounter: Payer: Self-pay | Admitting: Orthopedic Surgery

## 2023-07-14 ENCOUNTER — Ambulatory Visit (INDEPENDENT_AMBULATORY_CARE_PROVIDER_SITE_OTHER): Payer: Medicaid Other | Admitting: Orthopedic Surgery

## 2023-07-14 DIAGNOSIS — L97911 Non-pressure chronic ulcer of unspecified part of right lower leg limited to breakdown of skin: Secondary | ICD-10-CM | POA: Diagnosis not present

## 2023-07-14 DIAGNOSIS — L97921 Non-pressure chronic ulcer of unspecified part of left lower leg limited to breakdown of skin: Secondary | ICD-10-CM

## 2023-07-14 DIAGNOSIS — I87333 Chronic venous hypertension (idiopathic) with ulcer and inflammation of bilateral lower extremity: Secondary | ICD-10-CM | POA: Diagnosis not present

## 2023-07-14 DIAGNOSIS — I890 Lymphedema, not elsewhere classified: Secondary | ICD-10-CM | POA: Diagnosis not present

## 2023-07-14 NOTE — Progress Notes (Signed)
 Office Visit Note   Patient: Evan [name]           Date of Birth: 05/18/1966           MRN: [id number] Visit Date: 07/14/2023              Requested by: Dettinger, Elige Radon, MD [address] [place],  [place] [address] PCP: Dettinger, Elige Radon, MD  Chief Complaint  Patient presents with   Right Leg - Wound Check   Left Leg - Wound Check      HPI: Is a 58 year old gentleman who is seen in follow-up for venous insufficiency ulcers both lower extremities.  Patient is status post Kerecis micro graft to the medial malleolus ulcer on the left.  Patient has been in serial compression wraps.  Patient states he did obtain a size large knee-high compression socks.  Assessment & Plan: Visit Diagnoses:  1. 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 Ulcers of both lower extremities, limited to breakdown of skin (HCC)   3. Lymphedema     Plan: Will plan for 1 additional multilayer compression wrap today follow-up in 1 week to start with the Vive wear socks.  Follow-Up Instructions: Return in about 1 week (around 07/21/2023).   Ortho Exam  Patient is alert, oriented, no adenopathy, well-dressed, normal affect, normal respiratory effort. Measurements of both legs his calf is 36 cm in circumference on the right 37 cm in circumference on the left which corresponds to a size large sock.  Patient has healthy granulation tissue in the base of all wounds.  The drainage has decreased there is no cellulitis.  The medial malleolar ulcer on the left measures 2.5 mm x 2.5 mm.  Imaging: No results found.      Labs: Lab Results  Component Value Date   HGBA1C 7.3 (H) 04/19/2023   HGBA1C 7.4 (H) 02/11/2023   HGBA1C 7.6 (H) 11/10/2022   ESRSEDRATE 3 05/05/2023   ESRSEDRATE 1 05/05/2023   CRP 2.0 (H) 05/05/2023   CRP 2.5 (H) 05/05/2023   LABURIC 7.1 10/12/2019   LABURIC 6.1 11/24/2018   LABURIC 5.9 02/04/2017   REPTSTATUS  05/10/2023 FINAL 05/05/2023   CULT  05/05/2023    NO GROWTH 5 DAYS Performed at [facility], [address] [place], [place] [address]      Lab Results  Component Value Date   ALBUMIN 2.9 (L) 06/10/2023   ALBUMIN 3.1 (L) 05/28/2023   ALBUMIN 3.0 (L) 05/27/2023   PREALBUMIN 12 (L) 05/05/2023    Lab Results  Component Value Date   MG 2.2 06/02/2023   MG 2.0 06/01/2023   MG 2.2 05/31/2023   No results found for: "VD25OH"  Lab Results  Component Value Date   PREALBUMIN 12 (L) 05/05/2023      Latest Ref Rng & Units 06/01/2023    7:17 AM 05/31/2023    4:44 AM 05/30/2023    4:15 AM  CBC EXTENDED  WBC 4.0 - 10.5 K/uL 10.0  10.3  11.7   RBC 4.22 - 5.81 MIL/uL 5.62  5.62  5.37   Hemoglobin 13.0 - 17.0 g/dL 82.9  56.2  13.0   HCT 39.0 - 52.0 % 45.0  44.8  42.7   Platelets 150 - 400 K/uL 254  244  255   NEUT# 1.7 - 7.7 K/uL   9.3   Lymph# 0.7 - 4.0 K/uL   1.1  There is no height or weight on file to calculate BMI.  Orders:  No orders of the defined types were placed in this encounter.  No orders of the defined types were placed in this encounter.    Procedures: No procedures performed  Clinical Data: No additional findings.  ROS:  All other systems negative, except as noted in the HPI. Review of Systems  Objective: Vital Signs: There were no vitals taken for this visit.  Specialty Comments:  No specialty comments available.  PMFS History: Patient Active Problem List   Diagnosis Date Noted   Polysubstance abuse (HCC) 06/17/2023   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) 05/28/2023   Lymphedema 05/28/2023   Nonsustained ventricular tachycardia (HCC) 05/28/2023   History of coronary artery stent placement 05/28/2023   Left ventricular thrombus 05/28/2023   Pulmonary hypertension, unspecified (HCC) 05/28/2023   OSA (obstructive sleep apnea) 05/28/2023   Cocaine use 05/28/2023    Atherosclerosis of native coronary artery of native heart without angina pectoris 05/28/2023   Biventricular heart failure (HCC) 05/28/2023   Acute decompensated heart failure (HCC) 05/28/2023   Acute HFrEF (heart failure with reduced ejection fraction) (HCC) 05/27/2023   Diabetic foot ulcer (HCC) 05/05/2023   History of acute cholecystitis 04/18/23 treated conservatively 05/05/2023   Acute on chronic HFrEF (heart failure with reduced ejection fraction) (HCC) 05/05/2023   Diabetic ulcer of lower leg (HCC) 05/05/2023   Cellulitis in diabetic foot (HCC) 05/05/2023   CHF (congestive heart failure) (HCC) 05/05/2023   Bilateral leg ulcer (HCC) 05/05/2023   Acute on chronic combined systolic and diastolic CHF (congestive heart failure) (HCC) 05/05/2023   Vasculitis (HCC) 05/05/2023   Abdominal pain 04/19/2023   Elevated brain natriuretic peptide (BNP) level 04/19/2023   Elevated troponin 04/19/2023   Elevated d-dimer 04/19/2023   Type 2 diabetes mellitus with hyperglycemia (HCC) 04/19/2023   Acute cholecystitis 04/18/2023   Hypertension 10/06/2022   Type 2 diabetes mellitus with complications (HCC) 02/15/2021   CAD S/P PCI 12/09/2018   Ischemic cardiomyopathy 12/09/2018   Left ventricular dysfunction    History of non-ST elevation myocardial infarction (NSTEMI) 11/24/2018   Obesity (BMI 30.0-34.9) 01/05/2018   GERD (gastroesophageal reflux disease) 07/08/2017   Mixed hyperlipidemia 12/22/2016   Essential hypertension 09/22/2016   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) 09/22/2016   Gout 09/22/2016   Past Medical History:  Diagnosis Date   Arthritis    Atrial fibrillation (HCC)    CHF (congestive heart failure) (HCC)    Diabetes mellitus without complication (HCC)    Gout    Heart attack (HCC) 10/2018   Hyperlipidemia    Hypertension    Morbid obesity (HCC) 12/22/2016   Sleep apnea    had test twice unable to complete    Family History  Problem Relation Age of Onset    Diabetes Mother    Stroke Mother    Early death Father    Heart attack Father        died from heart attack at the age of 83   Alcohol abuse Father    Heart disease Father    Breast cancer Sister    Hypertension Brother    Heart attack Maternal Grandfather        died from heart attack at the age of 62   Early death Paternal Grandfather    Heart attack Paternal Grandfather    Colon cancer Neg Hx    Esophageal cancer Neg Hx  Rectal cancer Neg Hx    Stomach cancer Neg Hx     Past Surgical History:  Procedure Laterality Date   ANKLE ARTHROSCOPY Right    COLONOSCOPY WITH PROPOFOL N/A 09/18/2020   Procedure: COLONOSCOPY WITH PROPOFOL;  Surgeon: Sherrilyn Rist, MD;  Location: [facility];  Service: Gastroenterology;  Laterality: N/A;  09-18-2020   CORONARY STENT INTERVENTION N/A 11/24/2018   Procedure: CORONARY STENT INTERVENTION;  Surgeon: Iran Ouch, MD;  Location: [facility];  Service: Cardiovascular;  Laterality: N/A;  prox and mid LAD   CORONARY STENT INTERVENTION N/A 11/26/2018   Procedure: CORONARY STENT INTERVENTION;  Surgeon: Iran Ouch, MD;  Location: [facility];  Service: Cardiovascular;  Laterality: N/A;   CYST REMOVAL HAND     KNEE ARTHROSCOPY Left    LEFT HEART CATH AND CORONARY ANGIOGRAPHY N/A 11/24/2018   Procedure: LEFT HEART CATH AND CORONARY ANGIOGRAPHY;  Surgeon: Iran Ouch, MD;  Location: [facility];  Service: Cardiovascular;  Laterality: N/A;   POLYPECTOMY  09/18/2020   Procedure: POLYPECTOMY;  Surgeon: Sherrilyn Rist, MD;  Location: [facility];  Service: Gastroenterology;;   Social History   Occupational History   Occupation: Disability  Tobacco Use   Smoking status: Never   Smokeless tobacco: Never  Vaping Use   Vaping status: Never Used  Substance and Sexual Activity   Alcohol use: Not Currently    Alcohol/week: 12.0 standard drinks of alcohol    Types: 12 Cans of beer per week   Drug use: No   Sexual  activity: Yes    Comment: male 8 months partner

## 2023-07-21 ENCOUNTER — Ambulatory Visit: Payer: Medicaid Other | Admitting: Family

## 2023-07-21 ENCOUNTER — Encounter: Payer: Self-pay | Admitting: Family

## 2023-07-21 DIAGNOSIS — L97911 Non-pressure chronic ulcer of unspecified part of right lower leg limited to breakdown of skin: Secondary | ICD-10-CM

## 2023-07-21 DIAGNOSIS — I890 Lymphedema, not elsewhere classified: Secondary | ICD-10-CM

## 2023-07-21 DIAGNOSIS — L97921 Non-pressure chronic ulcer of unspecified part of left lower leg limited to breakdown of skin: Secondary | ICD-10-CM | POA: Diagnosis not present

## 2023-07-21 DIAGNOSIS — I87333 Chronic venous hypertension (idiopathic) with ulcer and inflammation of bilateral lower extremity: Secondary | ICD-10-CM

## 2023-07-21 NOTE — Progress Notes (Signed)
 Office Visit Note   Patient: Evan [name]           Date of Birth: 16-Nov-1965           MRN: [id number] Visit Date: 07/21/2023              Requested by: Dettinger, Elige Radon, MD [address] [place],  [place] [address] PCP: Dettinger, Elige Radon, MD  No chief complaint on file.     HPI: The patient is a 58 year old gentleman who is seen in follow-up for left medial ankle ulcer as well as right calf ulcer.  He has been in Dynaflex compression garments to bilateral lower extremities.  Today he brings in a pair of VI VE compression garments  Assessment & Plan: Visit Diagnoses: No diagnosis found.  Plan: We will plan to advance to the compression stockings with direct skin contact he is instructed on use wear and padding on the outside to collect any excess drainage.  He will follow-up in 1 week to reevaluate new intervention  Follow-Up Instructions: No follow-ups on file.   Ortho Exam  Patient is alert, oriented, no adenopathy, well-dressed, normal affect, normal respiratory effort. On examination bilateral lower extremities there is healthy granulation in the base of both of his ulcers.  The medial malleolar ulcer on the left today measures 2 cm x 2 cm with 1 mm of depth with The right calf ulcer is 5 cm x 2 and half centimeters with 1 mm of depth there is no surrounding erythema no purulence no sign of infection Imaging: No results found.      Labs: Lab Results  Component Value Date   HGBA1C 7.3 (H) 04/19/2023   HGBA1C 7.4 (H) 02/11/2023   HGBA1C 7.6 (H) 11/10/2022   ESRSEDRATE 3 05/05/2023   ESRSEDRATE 1 05/05/2023   CRP 2.0 (H) 05/05/2023   CRP 2.5 (H) 05/05/2023   LABURIC 7.1 10/12/2019   LABURIC 6.1 11/24/2018   LABURIC 5.9 02/04/2017   REPTSTATUS 05/10/2023 FINAL 05/05/2023   CULT  05/05/2023    NO GROWTH 5 DAYS Performed at [facility], [address] [place], [place] [address]      Lab Results  Component Value Date   ALBUMIN 2.9 (L) 06/10/2023    ALBUMIN 3.1 (L) 05/28/2023   ALBUMIN 3.0 (L) 05/27/2023   PREALBUMIN 12 (L) 05/05/2023    Lab Results  Component Value Date   MG 2.2 06/02/2023   MG 2.0 06/01/2023   MG 2.2 05/31/2023   No results found for: "VD25OH"  Lab Results  Component Value Date   PREALBUMIN 12 (L) 05/05/2023      Latest Ref Rng & Units 06/01/2023    7:17 AM 05/31/2023    4:44 AM 05/30/2023    4:15 AM  CBC EXTENDED  WBC 4.0 - 10.5 K/uL 10.0  10.3  11.7   RBC 4.22 - 5.81 MIL/uL 5.62  5.62  5.37   Hemoglobin 13.0 - 17.0 g/dL 52.8  41.3  24.4   HCT 39.0 - 52.0 % 45.0  44.8  42.7   Platelets 150 - 400 K/uL 254  244  255   NEUT# 1.7 - 7.7 K/uL   9.3   Lymph# 0.7 - 4.0 K/uL   1.1      There is no height or weight on file to calculate BMI.  Orders:  No orders of the defined types were placed in this encounter.  No orders of the defined types were placed  in this encounter.    Procedures: No procedures performed  Clinical Data: No additional findings.  ROS:  All other systems negative, except as noted in the HPI. Review of Systems  Objective: Vital Signs: There were no vitals taken for this visit.  Specialty Comments:  No specialty comments available.  PMFS History: Patient Active Problem List   Diagnosis Date Noted   Polysubstance abuse (HCC) 06/17/2023   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) 05/28/2023   Lymphedema 05/28/2023   Nonsustained ventricular tachycardia (HCC) 05/28/2023   History of coronary artery stent placement 05/28/2023   Left ventricular thrombus 05/28/2023   Pulmonary hypertension, unspecified (HCC) 05/28/2023   OSA (obstructive sleep apnea) 05/28/2023   Cocaine use 05/28/2023   Atherosclerosis of native coronary artery of native heart without angina pectoris 05/28/2023   Biventricular heart failure (HCC) 05/28/2023   Acute decompensated heart failure (HCC) 05/28/2023   Acute HFrEF (heart failure  with reduced ejection fraction) (HCC) 05/27/2023   Diabetic foot ulcer (HCC) 05/05/2023   History of acute cholecystitis 04/18/23 treated conservatively 05/05/2023   Acute on chronic HFrEF (heart failure with reduced ejection fraction) (HCC) 05/05/2023   Diabetic ulcer of lower leg (HCC) 05/05/2023   Cellulitis in diabetic foot (HCC) 05/05/2023   CHF (congestive heart failure) (HCC) 05/05/2023   Bilateral leg ulcer (HCC) 05/05/2023   Acute on chronic combined systolic and diastolic CHF (congestive heart failure) (HCC) 05/05/2023   Vasculitis (HCC) 05/05/2023   Abdominal pain 04/19/2023   Elevated brain natriuretic peptide (BNP) level 04/19/2023   Elevated troponin 04/19/2023   Elevated d-dimer 04/19/2023   Type 2 diabetes mellitus with hyperglycemia (HCC) 04/19/2023   Acute cholecystitis 04/18/2023   Hypertension 10/06/2022   Type 2 diabetes mellitus with complications (HCC) 02/15/2021   CAD S/P PCI 12/09/2018   Ischemic cardiomyopathy 12/09/2018   Left ventricular dysfunction    History of non-ST elevation myocardial infarction (NSTEMI) 11/24/2018   Obesity (BMI 30.0-34.9) 01/05/2018   GERD (gastroesophageal reflux disease) 07/08/2017   Mixed hyperlipidemia 12/22/2016   Essential hypertension 09/22/2016   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) 09/22/2016   Gout 09/22/2016   Past Medical History:  Diagnosis Date   Arthritis    Atrial fibrillation (HCC)    CHF (congestive heart failure) (HCC)    Diabetes mellitus without complication (HCC)    Gout    Heart attack (HCC) 10/2018   Hyperlipidemia    Hypertension    Morbid obesity (HCC) 12/22/2016   Sleep apnea    had test twice unable to complete    Family History  Problem Relation Age of Onset   Diabetes Mother    Stroke Mother    Early death Father    Heart attack Father        died from heart attack at the age of 18   Alcohol abuse Father    Heart disease Father    Breast cancer Sister    Hypertension  Brother    Heart attack Maternal Grandfather        died from heart attack at the age of 60   Early death Paternal Grandfather    Heart attack Paternal Grandfather    Colon cancer Neg Hx    Esophageal cancer Neg Hx    Rectal cancer Neg Hx    Stomach cancer Neg Hx     Past Surgical History:  Procedure Laterality Date   ANKLE ARTHROSCOPY Right    COLONOSCOPY  WITH PROPOFOL N/A 09/18/2020   Procedure: COLONOSCOPY WITH PROPOFOL;  Surgeon: Sherrilyn Rist, MD;  Location: [facility];  Service: Gastroenterology;  Laterality: N/A;  09-18-2020   CORONARY STENT INTERVENTION N/A 11/24/2018   Procedure: CORONARY STENT INTERVENTION;  Surgeon: Iran Ouch, MD;  Location: [facility];  Service: Cardiovascular;  Laterality: N/A;  prox and mid LAD   CORONARY STENT INTERVENTION N/A 11/26/2018   Procedure: CORONARY STENT INTERVENTION;  Surgeon: Iran Ouch, MD;  Location: [facility];  Service: Cardiovascular;  Laterality: N/A;   CYST REMOVAL HAND     KNEE ARTHROSCOPY Left    LEFT HEART CATH AND CORONARY ANGIOGRAPHY N/A 11/24/2018   Procedure: LEFT HEART CATH AND CORONARY ANGIOGRAPHY;  Surgeon: Iran Ouch, MD;  Location: [facility];  Service: Cardiovascular;  Laterality: N/A;   POLYPECTOMY  09/18/2020   Procedure: POLYPECTOMY;  Surgeon: Sherrilyn Rist, MD;  Location: [facility];  Service: Gastroenterology;;   Social History   Occupational History   Occupation: Disability  Tobacco Use   Smoking status: Never   Smokeless tobacco: Never  Vaping Use   Vaping status: Never Used  Substance and Sexual Activity   Alcohol use: Not Currently    Alcohol/week: 12.0 standard drinks of alcohol    Types: 12 Cans of beer per week   Drug use: No   Sexual activity: Yes    Comment: male 8 months partner

## 2023-07-22 ENCOUNTER — Ambulatory Visit: Payer: Medicaid Other | Admitting: Cardiovascular Disease

## 2023-07-28 ENCOUNTER — Encounter: Payer: Self-pay | Admitting: Family

## 2023-07-28 ENCOUNTER — Ambulatory Visit: Payer: Self-pay | Admitting: Family Medicine

## 2023-07-28 ENCOUNTER — Ambulatory Visit: Admitting: Family

## 2023-07-28 DIAGNOSIS — L97911 Non-pressure chronic ulcer of unspecified part of right lower leg limited to breakdown of skin: Secondary | ICD-10-CM

## 2023-07-28 DIAGNOSIS — I890 Lymphedema, not elsewhere classified: Secondary | ICD-10-CM

## 2023-07-28 DIAGNOSIS — L97921 Non-pressure chronic ulcer of unspecified part of left lower leg limited to breakdown of skin: Secondary | ICD-10-CM | POA: Diagnosis not present

## 2023-07-28 DIAGNOSIS — I87333 Chronic venous hypertension (idiopathic) with ulcer and inflammation of bilateral lower extremity: Secondary | ICD-10-CM

## 2023-07-28 NOTE — Progress Notes (Signed)
 Office Visit Note   Patient: Evan [name]           Date of Birth: 03-08-1966           MRN: [id number] Visit Date: 07/28/2023              Requested by: Dettinger, Elige Radon, MD [id number] [place],  [place] [address] PCP: Dettinger, Elige Radon, MD  Chief Complaint  Patient presents with   Right Leg - Follow-up   Left Leg - Follow-up      HPI: The patient is a 58 year old gentleman who is seen in follow-up for left medial ankle ulcer as well as right calf ulcer.  He has been in Dynaflex compression garments to bilateral lower extremities.  Today he is wearing a pair of VI VE compression garments with direct skin contact.  Reports he has purchased a second pair.  Does not have them with him today.  Assessment & Plan: Visit Diagnoses: No diagnosis found.  Plan: We will plan to continue the compression stockings with direct skin contact he is instructed on use wear and padding on the outside to collect any excess drainage.  He will follow-up in 2 weeks to reevaluate new intervention  Follow-Up Instructions: No follow-ups on file.   Ortho Exam  Patient is alert, oriented, no adenopathy, well-dressed, normal affect, normal respiratory effort. On examination bilateral lower extremities there is healthy granulation in the base of both of his ulcers.  The medial malleolar ulcer on the left today measures 2 cm x 2 cm with 1 mm of depth with granulation in the wound bed.  There is no surrounding maceration or concerning sign.  The left calf ulcer is 3 cm in diameter with full granulation as well no sign of infection The right calf ulcer is 4.5 cm x 2 centimeters with 1 mm of depth there is no surrounding erythema no purulence no sign of infection Imaging: No results found.      Labs: Lab Results  Component Value Date   HGBA1C 7.3 (H) 04/19/2023   HGBA1C 7.4 (H) 02/11/2023   HGBA1C 7.6 (H) 11/10/2022   ESRSEDRATE 3 05/05/2023   ESRSEDRATE 1 05/05/2023   CRP 2.0 (H) 05/05/2023    CRP 2.5 (H) 05/05/2023   LABURIC 7.1 10/12/2019   LABURIC 6.1 11/24/2018   LABURIC 5.9 02/04/2017   REPTSTATUS 05/10/2023 FINAL 05/05/2023   CULT  05/05/2023    NO GROWTH 5 DAYS Performed at [facility], [address] [place], [place] [address]      Lab Results  Component Value Date   ALBUMIN 2.9 (L) 06/10/2023   ALBUMIN 3.1 (L) 05/28/2023   ALBUMIN 3.0 (L) 05/27/2023   PREALBUMIN 12 (L) 05/05/2023    Lab Results  Component Value Date   MG 2.2 06/02/2023   MG 2.0 06/01/2023   MG 2.2 05/31/2023   No results found for: "VD25OH"  Lab Results  Component Value Date   PREALBUMIN 12 (L) 05/05/2023      Latest Ref Rng & Units 06/01/2023    7:17 AM 05/31/2023    4:44 AM 05/30/2023    4:15 AM  CBC EXTENDED  WBC 4.0 - 10.5 K/uL 10.0  10.3  11.7   RBC 4.22 - 5.81 MIL/uL 5.62  5.62  5.37   Hemoglobin 13.0 - 17.0 g/dL 32.4  40.1  02.7   HCT 39.0 - 52.0 % 45.0  44.8  42.7   Platelets 150 - 400 K/uL  254  244  255   NEUT# 1.7 - 7.7 K/uL   9.3   Lymph# 0.7 - 4.0 K/uL   1.1      There is no height or weight on file to calculate BMI.  Orders:  No orders of the defined types were placed in this encounter.  No orders of the defined types were placed in this encounter.    Procedures: No procedures performed  Clinical Data: No additional findings.  ROS:  All other systems negative, except as noted in the HPI. Review of Systems  Objective: Vital Signs: There were no vitals taken for this visit.  Specialty Comments:  No specialty comments available.  PMFS History: Patient Active Problem List   Diagnosis Date Noted   Polysubstance abuse (HCC) 06/17/2023   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) 05/28/2023   Lymphedema 05/28/2023   Nonsustained ventricular tachycardia (HCC) 05/28/2023   History of coronary artery stent placement 05/28/2023   Left ventricular thrombus 05/28/2023   Pulmonary  hypertension, unspecified (HCC) 05/28/2023   OSA (obstructive sleep apnea) 05/28/2023   Cocaine use 05/28/2023   Atherosclerosis of native coronary artery of native heart without angina pectoris 05/28/2023   Biventricular heart failure (HCC) 05/28/2023   Acute decompensated heart failure (HCC) 05/28/2023   Acute HFrEF (heart failure with reduced ejection fraction) (HCC) 05/27/2023   Diabetic foot ulcer (HCC) 05/05/2023   History of acute cholecystitis 04/18/23 treated conservatively 05/05/2023   Acute on chronic HFrEF (heart failure with reduced ejection fraction) (HCC) 05/05/2023   Diabetic ulcer of lower leg (HCC) 05/05/2023   Cellulitis in diabetic foot (HCC) 05/05/2023   CHF (congestive heart failure) (HCC) 05/05/2023   Bilateral leg ulcer (HCC) 05/05/2023   Acute on chronic combined systolic and diastolic CHF (congestive heart failure) (HCC) 05/05/2023   Vasculitis (HCC) 05/05/2023   Abdominal pain 04/19/2023   Elevated brain natriuretic peptide (BNP) level 04/19/2023   Elevated troponin 04/19/2023   Elevated d-dimer 04/19/2023   Type 2 diabetes mellitus with hyperglycemia (HCC) 04/19/2023   Acute cholecystitis 04/18/2023   Hypertension 10/06/2022   Type 2 diabetes mellitus with complications (HCC) 02/15/2021   CAD S/P PCI 12/09/2018   Ischemic cardiomyopathy 12/09/2018   Left ventricular dysfunction    History of non-ST elevation myocardial infarction (NSTEMI) 11/24/2018   Obesity (BMI 30.0-34.9) 01/05/2018   GERD (gastroesophageal reflux disease) 07/08/2017   Mixed hyperlipidemia 12/22/2016   Essential hypertension 09/22/2016   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) 09/22/2016   Gout 09/22/2016   Past Medical History:  Diagnosis Date   Arthritis    Atrial fibrillation (HCC)    CHF (congestive heart failure) (HCC)    Diabetes mellitus without complication (HCC)    Gout    Heart attack (HCC) 10/2018   Hyperlipidemia    Hypertension    Morbid obesity  (HCC) 12/22/2016   Sleep apnea    had test twice unable to complete    Family History  Problem Relation Age of Onset   Diabetes Mother    Stroke Mother    Early death Father    Heart attack Father        died from heart attack at the age of 19   Alcohol abuse Father    Heart disease Father    Breast cancer Sister    Hypertension Brother    Heart attack Maternal Grandfather        died from heart attack at the  age of 83   Early death Paternal Grandfather    Heart attack Paternal Grandfather    Colon cancer Neg Hx    Esophageal cancer Neg Hx    Rectal cancer Neg Hx    Stomach cancer Neg Hx     Past Surgical History:  Procedure Laterality Date   ANKLE ARTHROSCOPY Right    COLONOSCOPY WITH PROPOFOL N/A 09/18/2020   Procedure: COLONOSCOPY WITH PROPOFOL;  Surgeon: Sherrilyn Rist, MD;  Location: [facility];  Service: Gastroenterology;  Laterality: N/A;  09-18-2020   CORONARY STENT INTERVENTION N/A 11/24/2018   Procedure: CORONARY STENT INTERVENTION;  Surgeon: Iran Ouch, MD;  Location: [facility];  Service: Cardiovascular;  Laterality: N/A;  prox and mid LAD   CORONARY STENT INTERVENTION N/A 11/26/2018   Procedure: CORONARY STENT INTERVENTION;  Surgeon: Iran Ouch, MD;  Location: [facility];  Service: Cardiovascular;  Laterality: N/A;   CYST REMOVAL HAND     KNEE ARTHROSCOPY Left    LEFT HEART CATH AND CORONARY ANGIOGRAPHY N/A 11/24/2018   Procedure: LEFT HEART CATH AND CORONARY ANGIOGRAPHY;  Surgeon: Iran Ouch, MD;  Location: [facility];  Service: Cardiovascular;  Laterality: N/A;   POLYPECTOMY  09/18/2020   Procedure: POLYPECTOMY;  Surgeon: Sherrilyn Rist, MD;  Location: [facility];  Service: Gastroenterology;;   Social History   Occupational History   Occupation: Disability  Tobacco Use   Smoking status: Never   Smokeless tobacco: Never  Vaping Use   Vaping status: Never Used  Substance and Sexual Activity   Alcohol use: Not  Currently    Alcohol/week: 12.0 standard drinks of alcohol    Types: 12 Cans of beer per week   Drug use: No   Sexual activity: Yes    Comment: male 8 months partner

## 2023-07-28 NOTE — Progress Notes (Deleted)
 Office Visit Note  Patient: Evan [name]             Date of Birth: 09/29/1965           MRN: [id number]             PCP: Dettinger, Elige Radon, MD Referring: Dettinger, Elige Radon, MD Visit Date: 07/29/2023 Occupation: @GUAROCC @  Subjective:  No chief complaint on file.   History of Present Illness: Evan [name] is a 58 y.o. male ***     Activities of Daily Living:  Patient reports morning stiffness for *** {minute/hour:19697}.   Patient {ACTIONS;DENIES/REPORTS:21021675::"Denies"} nocturnal pain.  Difficulty dressing/grooming: {ACTIONS;DENIES/REPORTS:21021675::"Denies"} Difficulty climbing stairs: {ACTIONS;DENIES/REPORTS:21021675::"Denies"} Difficulty getting out of chair: {ACTIONS;DENIES/REPORTS:21021675::"Denies"} Difficulty using hands for taps, buttons, cutlery, and/or writing: {ACTIONS;DENIES/REPORTS:21021675::"Denies"}  No Rheumatology ROS completed.   PMFS History:  Patient Active Problem List   Diagnosis Date Noted   Polysubstance abuse (HCC) 06/17/2023   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) 05/28/2023   Lymphedema 05/28/2023   Nonsustained ventricular tachycardia (HCC) 05/28/2023   History of coronary artery stent placement 05/28/2023   Left ventricular thrombus 05/28/2023   Pulmonary hypertension, unspecified (HCC) 05/28/2023   OSA (obstructive sleep apnea) 05/28/2023   Cocaine use 05/28/2023   Atherosclerosis of native coronary artery of native heart without angina pectoris 05/28/2023   Biventricular heart failure (HCC) 05/28/2023   Acute decompensated heart failure (HCC) 05/28/2023   Acute HFrEF (heart failure with reduced ejection fraction) (HCC) 05/27/2023   Diabetic foot ulcer (HCC) 05/05/2023   History of acute cholecystitis 04/18/23 treated conservatively 05/05/2023   Acute on chronic HFrEF (heart failure with reduced ejection fraction) (HCC) 05/05/2023   Diabetic ulcer of lower leg  (HCC) 05/05/2023   Cellulitis in diabetic foot (HCC) 05/05/2023   CHF (congestive heart failure) (HCC) 05/05/2023   Bilateral leg ulcer (HCC) 05/05/2023   Acute on chronic combined systolic and diastolic CHF (congestive heart failure) (HCC) 05/05/2023   Vasculitis (HCC) 05/05/2023   Abdominal pain 04/19/2023   Elevated brain natriuretic peptide (BNP) level 04/19/2023   Elevated troponin 04/19/2023   Elevated d-dimer 04/19/2023   Type 2 diabetes mellitus with hyperglycemia (HCC) 04/19/2023   Acute cholecystitis 04/18/2023   Hypertension 10/06/2022   Type 2 diabetes mellitus with complications (HCC) 02/15/2021   CAD S/P PCI 12/09/2018   Ischemic cardiomyopathy 12/09/2018   Left ventricular dysfunction    History of non-ST elevation myocardial infarction (NSTEMI) 11/24/2018   Obesity (BMI 30.0-34.9) 01/05/2018   GERD (gastroesophageal reflux disease) 07/08/2017   Mixed hyperlipidemia 12/22/2016   Essential hypertension 09/22/2016   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) 09/22/2016   Gout 09/22/2016    Past Medical History:  Diagnosis Date   Arthritis    Atrial fibrillation (HCC)    CHF (congestive heart failure) (HCC)    Diabetes mellitus without complication (HCC)    Gout    Heart attack (HCC) 10/2018   Hyperlipidemia    Hypertension    Morbid obesity (HCC) 12/22/2016   Sleep apnea    had test twice unable to complete    Family History  Problem Relation Age of Onset   Diabetes Mother    Stroke Mother    Early death Father    Heart attack Father        died from heart attack at the age of 34   Alcohol abuse Father    Heart disease Father    Breast cancer Sister  Hypertension Brother    Heart attack Maternal Grandfather        died from heart attack at the age of 55   Early death Paternal Grandfather    Heart attack Paternal Grandfather    Colon cancer Neg Hx    Esophageal cancer Neg Hx    Rectal cancer Neg Hx    Stomach cancer Neg Hx    Past  Surgical History:  Procedure Laterality Date   ANKLE ARTHROSCOPY Right    COLONOSCOPY WITH PROPOFOL N/A 09/18/2020   Procedure: COLONOSCOPY WITH PROPOFOL;  Surgeon: Sherrilyn Rist, MD;  Location: [facility];  Service: Gastroenterology;  Laterality: N/A;  09-18-2020   CORONARY STENT INTERVENTION N/A 11/24/2018   Procedure: CORONARY STENT INTERVENTION;  Surgeon: Iran Ouch, MD;  Location: [facility];  Service: Cardiovascular;  Laterality: N/A;  prox and mid LAD   CORONARY STENT INTERVENTION N/A 11/26/2018   Procedure: CORONARY STENT INTERVENTION;  Surgeon: Iran Ouch, MD;  Location: [facility];  Service: Cardiovascular;  Laterality: N/A;   CYST REMOVAL HAND     KNEE ARTHROSCOPY Left    LEFT HEART CATH AND CORONARY ANGIOGRAPHY N/A 11/24/2018   Procedure: LEFT HEART CATH AND CORONARY ANGIOGRAPHY;  Surgeon: Iran Ouch, MD;  Location: [facility];  Service: Cardiovascular;  Laterality: N/A;   POLYPECTOMY  09/18/2020   Procedure: POLYPECTOMY;  Surgeon: Sherrilyn Rist, MD;  Location: [facility];  Service: Gastroenterology;;   Social History   Social History Narrative   Not on file   Immunization History  Administered Date(s) Administered   Influenza,inj,Quad PF,6+ Mos 04/06/2017, 04/07/2018, 03/30/2019, 05/24/2020, 02/15/2021, 02/27/2022   Moderna Sars-Covid-2 Vaccination 01/18/2020, 02/06/2020   Pneumococcal Polysaccharide-23 07/09/2018   Td 08/10/1996, 09/22/2016   Tdap 09/22/2016   Zoster Recombinant(Shingrix) 05/23/2021, 08/21/2021     Objective: Vital Signs: There were no vitals taken for this visit.   Physical Exam   Musculoskeletal Exam: ***  CDAI Exam: CDAI Score: -- Patient Global: --; Provider Global: -- Swollen: --; Tender: -- Joint Exam 07/29/2023   No joint exam has been documented for this visit   There is currently no information documented on the homunculus. Go to the Rheumatology activity and complete the homunculus  joint exam.  Investigation: No additional findings.  Imaging: No results found.  Recent Labs: Lab Results  Component Value Date   WBC 10.0 06/01/2023   HGB 14.6 06/01/2023   PLT 254 06/01/2023   NA 137 07/13/2023   K 3.3 (L) 07/13/2023   CL 102 07/13/2023   CO2 22 07/13/2023   GLUCOSE 132 (H) 07/13/2023   BUN 22 (H) 07/13/2023   CREATININE 1.22 07/13/2023   BILITOT 0.9 06/10/2023   ALKPHOS 75 06/10/2023   AST 24 06/10/2023   ALT 20 06/10/2023   PROT 6.4 (L) 06/10/2023   ALBUMIN 2.9 (L) 06/10/2023   CALCIUM 9.0 07/13/2023   GFRAA 87 05/24/2020    Speciality Comments: No specialty comments available.  Procedures:  No procedures performed Allergies: Metoprolol   Assessment / Plan:     Visit Diagnoses: No diagnosis found.  Orders: No orders of the defined types were placed in this encounter.  No orders of the defined types were placed in this encounter.   Face-to-face time spent with patient was *** minutes. Greater than 50% of time was spent in counseling and coordination of care.  Follow-Up Instructions: No follow-ups on file.   Fuller Plan, MD  Note -  This record has been created using [employer].  Chart creation errors have been sought, but may not always  have been located. Such creation errors do not reflect on  the standard of medical care.

## 2023-07-28 NOTE — Telephone Encounter (Signed)
 Chief Complaint: Lower abdominal pain when bending over or slouching Symptoms: see above Frequency: Intermittent for months Pertinent Negatives: Patient denies n/a Disposition: [] ED /[] Urgent Care (no appt availability in office) / [x] Appointment(In office/virtual)/ []  [facility]/ [] Home Care/ [] Refused Recommended Disposition /[] [facility]/ []  Follow-up with PCP Additional Notes: Patient called stating he has been having intermittent lower abdominal pain when he bends over or slouches. Patient states this has been occurring for months. He notices when he stands up straight, the pain resides. Patient is uncertain of cause, but states he has stopped having as many bowel movements. Patient was hospitalized a few months ago with a suspected bladder infection, as well. Patient appt made for tomorrow for further evaluation.    Copied from CRM [id number]. Topic: Clinical - Red Word Triage >> Jul 28, 2023  4:46 PM Turkey B wrote: [facility] that prompted transfer to Nurse Triage: pt stomach hurst when he bends over and can hardly catch his breath Reason for Disposition  [1] MODERATE pain (e.g., interferes with normal activities) AND [2] pain comes and goes (cramps) AND [3] present > 24 hours  (Exception: Pain with Vomiting or Diarrhea - see that Guideline.)  Answer Assessment - Initial Assessment Questions 1. LOCATION: "Where does it hurt?"      Stretches across bottom of stomach 2. RADIATION: "Does the pain shoot anywhere else?" (e.g., chest, back)     No 3. ONSET: "When did the pain begin?" (Minutes, hours or days ago)      Intermittent for a few months 4. SUDDEN: "Gradual or sudden onset?"     N/a 5. PATTERN "Does the pain come and go, or is it constant?"    - If it comes and goes: "How long does it last?" "Do you have pain now?"     (Note: Comes and goes means the pain is intermittent. It goes away completely between bouts.)    - If constant: "Is it getting better,  staying the same, or getting worse?"      (Note: Constant means the pain never goes away completely; most serious pain is constant and gets worse.)      Comes and goes 6. SEVERITY: "How bad is the pain?"  (e.g., Scale 1-10; mild, moderate, or severe)    - MILD (1-3): Doesn't interfere with normal activities, abdomen soft and not tender to touch.     - MODERATE (4-7): Interferes with normal activities or awakens from sleep, abdomen tender to touch.     - SEVERE (8-10): Excruciating pain, doubled over, unable to do any normal activities.       7 7. RECURRENT SYMPTOM: "Have you ever had this type of stomach pain before?" If Yes, ask: "When was the last time?" and "What happened that time?"      N/a - see notes 8. CAUSE: "What do you think is causing the stomach pain?"     Unsure  9. RELIEVING/AGGRAVATING FACTORS: "What makes it better or worse?" (e.g., antacids, bending or twisting motion, bowel movement)     Standing up feels better, straight posture. Only hurts when slouched or bending over 10. OTHER SYMPTOMS: "Do you have any other symptoms?" (e.g., back pain, diarrhea, fever, urination pain, vomiting)       A little more constipation, intermittent dizziness  Protocols used: Abdominal Pain - Male-A-AH

## 2023-07-29 ENCOUNTER — Encounter: Payer: Medicaid Other | Admitting: Internal Medicine

## 2023-07-29 ENCOUNTER — Ambulatory Visit: Admitting: Family Medicine

## 2023-07-29 VITALS — BP 105/72 | HR 100 | Temp 98.1°F | Ht 77.0 in | Wt 232.8 lb

## 2023-07-29 DIAGNOSIS — K828 Other specified diseases of gallbladder: Secondary | ICD-10-CM

## 2023-07-29 DIAGNOSIS — R140 Abdominal distension (gaseous): Secondary | ICD-10-CM

## 2023-07-29 DIAGNOSIS — R1084 Generalized abdominal pain: Secondary | ICD-10-CM

## 2023-07-29 DIAGNOSIS — R198 Other specified symptoms and signs involving the digestive system and abdomen: Secondary | ICD-10-CM | POA: Diagnosis not present

## 2023-07-29 NOTE — Progress Notes (Unsigned)
 Acute Office Visit  Subjective:     Patient ID: Evan [name], male    DOB: 1966/05/08, 58 y.o.   MRN: [id number]  Chief Complaint  Patient presents with   Abdominal Pain    Abdominal Pain This is a recurrent problem. Episode onset: 4 months. The onset quality is gradual. The problem occurs intermittently. The problem has been waxing and waning. The pain is located in the generalized abdominal region. The quality of the pain is a sensation of fullness. The abdominal pain does not radiate. Associated symptoms include anorexia (decreased), belching, constipation, flatus and weight loss (diuresis from CHF). Pertinent negatives include no diarrhea, dysuria, fever, frequency, hematochezia, hematuria, nausea or vomiting. Associated symptoms comments: bloating. The pain is aggravated by eating (bending over). The pain is relieved by Being still. He has tried antacids for the symptoms. His past medical history is significant for gallstones.   He was seen in the ER in November 2024.  Per abdominal CT report 04/18/23: " Gallbladder wall thickening with pericholecystic edema, likely cholecystitis. No discrete stones are identified.". HIDA scan was recommend, patient declined. Was recommend outpatient general surgery referral. He did not have follow up.    Review of Systems  Constitutional:  Positive for weight loss (diuresis from CHF). Negative for fever.  Gastrointestinal:  Positive for abdominal pain, anorexia (decreased), constipation and flatus. Negative for diarrhea, hematochezia, nausea and vomiting.  Genitourinary:  Negative for dysuria, frequency and hematuria.        Objective:    BP 105/72   Pulse 100   Temp 98.1 F (36.7 C) (Temporal)   Ht 6\' 5"  (1.956 m)   Wt 232 lb 12.8 oz (105.6 kg)   SpO2 97%   BMI 27.61 kg/m  Wt Readings from Last 3 Encounters:  07/29/23 232 lb 12.8 oz (105.6 kg)  07/13/23 245 lb 12.8 oz (111.5 kg)  06/17/23 242 lb (109.8 kg)      Physical  Exam Vitals and nursing note reviewed.  Constitutional:      General: He is not in acute distress.    Appearance: He is not ill-appearing, toxic-appearing or diaphoretic.  Eyes:     General: No scleral icterus. Cardiovascular:     Rate and Rhythm: Normal rate and regular rhythm.     Heart sounds: Normal heart sounds.  Pulmonary:     Effort: Pulmonary effort is normal. No respiratory distress.     Breath sounds: Normal breath sounds. No wheezing, rhonchi or rales.  Chest:     Chest wall: No tenderness.  Abdominal:     General: Bowel sounds are normal. There is no distension.     Palpations: Abdomen is soft.     Tenderness: There is generalized abdominal tenderness. There is no guarding or rebound. Positive signs include Murphy's sign. Negative signs include McBurney's sign.  Skin:    General: Skin is warm and dry.     Coloration: Skin is not jaundiced.  Neurological:     General: No focal deficit present.     Mental Status: He is alert and oriented to person, place, and time.  Psychiatric:        Mood and Affect: Mood normal.        Behavior: Behavior normal.     No results found for any visits on 07/29/23.      Assessment & Plan:   Evan [name] was seen today for abdominal pain.  Diagnoses and all orders for this visit:  Positive Eulah Pont Sign -  CBC with Differential/Platelet -     BMP8+EGFR -     Hepatic Function Panel -     Ambulatory referral to General Surgery  Thickening of wall of gallbladder with pericholecystic fluid -     Ambulatory referral to General Surgery  Abdominal bloating -     Ambulatory referral to General Surgery  Generalized abdominal pain -     Ambulatory referral to General Surgery   Direct bilirubin mildly elevated on labs. No other significant abnormalities. Symptoms have been ongoing since CT on 04/17/24 with likely cholescytitis that was treated conservatively. Gradually worsening. Discussed and placed referral to general surgery.    Return if symptoms worsen or fail to improve.  The patient indicates understanding of these issues and agrees with the plan.  Gabriel Earing, FNP

## 2023-07-30 ENCOUNTER — Encounter: Payer: Self-pay | Admitting: Family Medicine

## 2023-07-30 LAB — CBC WITH DIFFERENTIAL/PLATELET
Basophils Absolute: 0.1 10*3/uL (ref 0.0–0.2)
Basos: 1 %
EOS (ABSOLUTE): 0.1 10*3/uL (ref 0.0–0.4)
Eos: 1 %
Hematocrit: 49 % (ref 37.5–51.0)
Hemoglobin: 15.2 g/dL (ref 13.0–17.7)
Immature Grans (Abs): 0 10*3/uL (ref 0.0–0.1)
Immature Granulocytes: 0 %
Lymphocytes Absolute: 1.4 10*3/uL (ref 0.7–3.1)
Lymphs: 16 %
MCH: 24.5 pg — ABNORMAL LOW (ref 26.6–33.0)
MCHC: 31 g/dL — ABNORMAL LOW (ref 31.5–35.7)
MCV: 79 fL (ref 79–97)
Monocytes Absolute: 0.5 10*3/uL (ref 0.1–0.9)
Monocytes: 6 %
Neutrophils Absolute: 6.5 10*3/uL (ref 1.4–7.0)
Neutrophils: 76 %
Platelets: 281 10*3/uL (ref 150–450)
RBC: 6.21 x10E6/uL — ABNORMAL HIGH (ref 4.14–5.80)
RDW: 18.1 % — ABNORMAL HIGH (ref 11.6–15.4)
WBC: 8.6 10*3/uL (ref 3.4–10.8)

## 2023-07-30 LAB — HEPATIC FUNCTION PANEL
ALT: 17 IU/L (ref 0–44)
AST: 28 IU/L (ref 0–40)
Albumin: 3.9 g/dL (ref 3.8–4.9)
Alkaline Phosphatase: 93 IU/L (ref 44–121)
Bilirubin Total: 1.2 mg/dL (ref 0.0–1.2)
Bilirubin, Direct: 0.68 mg/dL — ABNORMAL HIGH (ref 0.00–0.40)
Total Protein: 6.8 g/dL (ref 6.0–8.5)

## 2023-07-30 LAB — BMP8+EGFR
BUN/Creatinine Ratio: 19 (ref 9–20)
BUN: 24 mg/dL (ref 6–24)
CO2: 19 mmol/L — ABNORMAL LOW (ref 20–29)
Calcium: 9.1 mg/dL (ref 8.7–10.2)
Chloride: 98 mmol/L (ref 96–106)
Creatinine, Ser: 1.27 mg/dL (ref 0.76–1.27)
Glucose: 192 mg/dL — ABNORMAL HIGH (ref 70–99)
Potassium: 4.3 mmol/L (ref 3.5–5.2)
Sodium: 135 mmol/L (ref 134–144)
eGFR: 66 mL/min/{1.73_m2} (ref >59)

## 2023-08-11 ENCOUNTER — Ambulatory Visit: Admitting: Family

## 2023-08-11 DIAGNOSIS — I87333 Chronic venous hypertension (idiopathic) with ulcer and inflammation of bilateral lower extremity: Secondary | ICD-10-CM

## 2023-08-11 DIAGNOSIS — I890 Lymphedema, not elsewhere classified: Secondary | ICD-10-CM | POA: Diagnosis not present

## 2023-08-11 DIAGNOSIS — E11622 Type 2 diabetes mellitus with other skin ulcer: Secondary | ICD-10-CM | POA: Diagnosis not present

## 2023-08-11 DIAGNOSIS — L97911 Non-pressure chronic ulcer of unspecified part of right lower leg limited to breakdown of skin: Secondary | ICD-10-CM | POA: Diagnosis not present

## 2023-08-11 DIAGNOSIS — L97909 Non-pressure chronic ulcer of unspecified part of unspecified lower leg with unspecified severity: Secondary | ICD-10-CM

## 2023-08-12 ENCOUNTER — Encounter: Payer: Self-pay | Admitting: Family Medicine

## 2023-08-12 ENCOUNTER — Ambulatory Visit: Payer: Medicaid Other | Admitting: Family Medicine

## 2023-08-13 ENCOUNTER — Telehealth: Payer: Self-pay | Admitting: Cardiology

## 2023-08-13 NOTE — Telephone Encounter (Incomplete)
 Called to confirm/remind patient of their appointment [facility] on 08/[facility]***.   Appointment:   [x] Confirmed  [] Left mess   [] No answer/No voice mail  [] Phone not in service  Patient reminded to bring all medications and/or complete list.  Confirmed patient has transportation. Gave directions, instructed to utilize valet parking.

## 2023-08-14 ENCOUNTER — Encounter: Payer: Self-pay | Admitting: Cardiology

## 2023-08-14 ENCOUNTER — Encounter: Payer: Self-pay | Admitting: Family

## 2023-08-14 ENCOUNTER — Other Ambulatory Visit (HOSPITAL_COMMUNITY): Payer: Self-pay

## 2023-08-14 ENCOUNTER — Ambulatory Visit: Payer: Medicaid Other | Attending: Cardiology | Admitting: Cardiology

## 2023-08-14 VITALS — BP 110/82 | HR 106 | Ht 77.0 in | Wt 230.2 lb

## 2023-08-14 DIAGNOSIS — I872 Venous insufficiency (chronic) (peripheral): Secondary | ICD-10-CM | POA: Insufficient documentation

## 2023-08-14 DIAGNOSIS — I428 Other cardiomyopathies: Secondary | ICD-10-CM | POA: Diagnosis not present

## 2023-08-14 DIAGNOSIS — I255 Ischemic cardiomyopathy: Secondary | ICD-10-CM | POA: Diagnosis not present

## 2023-08-14 DIAGNOSIS — Z955 Presence of coronary angioplasty implant and graft: Secondary | ICD-10-CM | POA: Insufficient documentation

## 2023-08-14 DIAGNOSIS — F1910 Other psychoactive substance abuse, uncomplicated: Secondary | ICD-10-CM | POA: Diagnosis not present

## 2023-08-14 DIAGNOSIS — E785 Hyperlipidemia, unspecified: Secondary | ICD-10-CM | POA: Diagnosis not present

## 2023-08-14 DIAGNOSIS — Z7901 Long term (current) use of anticoagulants: Secondary | ICD-10-CM | POA: Insufficient documentation

## 2023-08-14 DIAGNOSIS — I110 Hypertensive heart disease with heart failure: Secondary | ICD-10-CM | POA: Insufficient documentation

## 2023-08-14 DIAGNOSIS — Z7982 Long term (current) use of aspirin: Secondary | ICD-10-CM | POA: Diagnosis not present

## 2023-08-14 DIAGNOSIS — L97919 Non-pressure chronic ulcer of unspecified part of right lower leg with unspecified severity: Secondary | ICD-10-CM | POA: Diagnosis not present

## 2023-08-14 DIAGNOSIS — G4733 Obstructive sleep apnea (adult) (pediatric): Secondary | ICD-10-CM | POA: Diagnosis not present

## 2023-08-14 DIAGNOSIS — I2510 Atherosclerotic heart disease of native coronary artery without angina pectoris: Secondary | ICD-10-CM | POA: Diagnosis not present

## 2023-08-14 DIAGNOSIS — I252 Old myocardial infarction: Secondary | ICD-10-CM | POA: Diagnosis not present

## 2023-08-14 DIAGNOSIS — Z5181 Encounter for therapeutic drug level monitoring: Secondary | ICD-10-CM | POA: Diagnosis not present

## 2023-08-14 DIAGNOSIS — Z79899 Other long term (current) drug therapy: Secondary | ICD-10-CM | POA: Insufficient documentation

## 2023-08-14 DIAGNOSIS — Z794 Long term (current) use of insulin: Secondary | ICD-10-CM | POA: Diagnosis not present

## 2023-08-14 DIAGNOSIS — I480 Paroxysmal atrial fibrillation: Secondary | ICD-10-CM | POA: Diagnosis not present

## 2023-08-14 DIAGNOSIS — Z7984 Long term (current) use of oral hypoglycemic drugs: Secondary | ICD-10-CM | POA: Insufficient documentation

## 2023-08-14 DIAGNOSIS — I513 Intracardiac thrombosis, not elsewhere classified: Secondary | ICD-10-CM | POA: Diagnosis not present

## 2023-08-14 DIAGNOSIS — E119 Type 2 diabetes mellitus without complications: Secondary | ICD-10-CM | POA: Insufficient documentation

## 2023-08-14 DIAGNOSIS — I5022 Chronic systolic (congestive) heart failure: Secondary | ICD-10-CM | POA: Insufficient documentation

## 2023-08-14 DIAGNOSIS — L97929 Non-pressure chronic ulcer of unspecified part of left lower leg with unspecified severity: Secondary | ICD-10-CM | POA: Diagnosis not present

## 2023-08-14 MED ORDER — CARVEDILOL 3.125 MG PO TABS
3.1250 mg | ORAL_TABLET | Freq: Two times a day (BID) | ORAL | 11 refills | Status: DC
  Filled 2023-08-14: qty 60, 30d supply, fill #0

## 2023-08-14 MED ORDER — ALBUTEROL SULFATE HFA 108 (90 BASE) MCG/ACT IN AERS
2.0000 | INHALATION_SPRAY | RESPIRATORY_TRACT | 2 refills | Status: AC | PRN
  Filled 2023-08-14: qty 18, 20d supply, fill #0

## 2023-08-14 NOTE — Progress Notes (Addendum)
 ADVANCED HEART FAILURE CLINIC NOTE  Referring Physician: Dettinger, Evan Radon, MD  Primary Care: Evan [name], Evan Radon, MD   CC: HFREF / volume overload  HPI: Evan [name] is a 58 y.o. 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  Evan [name] was admitted to [facility] in January 2025 after presenting with severe volume overload and leaving AMA from his prior hospitalization.  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  Echocardiogram at that time in December with EF of 20% and LV apical thrombus.  During this time he was also using cocaine.  During his hospitalization he was started on milrinone and required midodrine to maintain normal systolic blood pressure.  He was aggressively diuresed and discharged home on digoxin, Jardiance, midodrine and torsemide.  Interval history - Reports feeling much better. He still gets fatigued quickly but has been compliant with medications now. No longer struggling with volume overload. Motivated to improve his health.   Activity level/exercise tolerance:  NYHA III Orthopnea:  Sleeps on 2-3 pillows Paroxysmal noctural dyspnea:  Yes Chest pain/pressure:  No Orthostatic lightheadedness:  No Palpitations:  No Lower extremity edema:  Yes, 2+ Presyncope/syncope:  No Cough:  No  Current Outpatient Medications  Medication Sig Dispense Refill   acetaminophen (TYLENOL) 325 MG tablet Take 2 tablets (650 mg total) by mouth every 6 (six) hours as needed for mild pain (pain score 1-3) or fever (or Fever >/= 101).     albuterol (VENTOLIN HFA) 108 (90 Base) MCG/ACT  inhaler Inhale 2 puffs into the lungs as needed for wheezing or shortness of breath. 18 g 2   apixaban (ELIQUIS) 5 MG TABS tablet Take 1 tablet (5 mg total) by mouth 2 (two) times daily. 60 tablet 2   aspirin EC 81 MG tablet Take 1 tablet (81 mg total) by mouth daily with breakfast. Swallow whole. 30 tablet 12   atorvastatin (LIPITOR) 80 MG tablet Take 1 tablet (80 mg total) by mouth daily. 90 tablet 3   Blood Glucose Monitoring Suppl (ACCU-CHEK AVIVA PLUS) w/Device KIT 1 each by Does not apply route 4 (four) times daily. 1 kit 1   Blood Glucose Monitoring Suppl (ACCU-CHEK AVIVA) device Test BS BID Dx E11.69 1 each 0   carvedilol (COREG) 3.125 MG tablet Take 1 tablet (3.125 mg total) by mouth 2 (two) times daily. 60 tablet 11   digoxin (LANOXIN) 0.125 MG tablet Take 1 tablet (0.125 mg total) by mouth daily. 30 tablet 2   empagliflozin (JARDIANCE) 25 MG TABS tablet Take 1 tablet (25 mg total) by mouth daily. 90 tablet 3   famotidine (PEPCID) 20 MG tablet Take 1 tablet (20 mg total) by mouth 2 (two) times daily. 180 tablet 3   fenofibrate (TRICOR) 145 MG tablet Take 1 tablet (145 mg total) by mouth daily. 90 tablet 3   fluticasone (FLONASE) 50 MCG/ACT nasal spray Place 1 spray into both nostrils 2 (two) times daily as needed for allergies or rhinitis. 16 g 6   glucose blood (ACCU-CHEK GUIDE) test strip Check BS in the morning and at bedtime Dx E11.8 200 strip 3   hydrOXYzine (VISTARIL) 25 MG capsule Take 1 capsule (25 mg total) by  mouth every 8 (eight) hours as needed. 30 capsule 2   pantoprazole (PROTONIX) 40 MG tablet Take 40 mg by mouth daily.     potassium chloride SA (KLOR-CON M) 20 MEQ tablet Take 2 tablets (40 mEq total) by mouth daily. 60 tablet 3   spironolactone (ALDACTONE) 25 MG tablet Take 0.5 tablets (12.5 mg total) by mouth daily. 45 tablet 3   torsemide (DEMADEX) 20 MG tablet Take 2 tablets (40 mg total) by mouth 2 (two) times daily. 120 tablet 3   BD PEN NEEDLE NANO 2ND GEN 32G X 4 MM  MISC Use daily with insulin Dx E11.9, Z79.4 100 each 3   insulin glargine (LANTUS SOLOSTAR) 100 UNIT/ML Solostar Pen Inject 10 Units into the skin daily. 9 mL 3   No current facility-administered medications for this visit.    PHYSICAL EXAM: Vitals:   08/14/23 1454  BP: 110/82  Pulse: (!) 106   GENERAL: NAD Lungs- CTA CARDIAC:  JVP: 7 cm          Normal rate with regular rhythm. No murmur.  Pulses 2+. No edema.  ABDOMEN: Soft, non-tender, non-distended.  EXTREMITIES: Warm and well perfused.  NEUROLOGIC: No obvious FND   DATA REVIEW  ECG: 05/28/23: NSR with old anterior infarct  As per my personal interpretation  ECHO: 05/05/23: LVEF 20%, moderately reduced RV function as per my personal interpretation 4/22: LVEF 35%-40%, normal RV function  CATH: 11/24/18:  1.  Severe three-vessel coronary artery disease.  Occluded ostial and mid LAD likely due to late presenting anterior STEMI.  Significant distal left circumflex disease but the supplied territory is small.  Significant mid RCA stenosis with occluded posterior AV groove with left-to-right collaterals. 2.  Severely reduced LV systolic function with an EF of 20 to 25% with anterior wall akinesis.  LVEDP was 32 mmHg. 3.  Successful PCI and drug-eluting stent placement to the ostial as well as mid LAD.   ASSESSMENT & PLAN:  Heart failure with reduced EF Etiology of ZO:XWRUE ischemic & nonsichemic cardiomyopathy; likely exacerbated by polysubstance abuse.  NYHA class / AHA Stage:NYHA II-III Volume status & Diuretics: continue torsemide 40mg  BID Vasodilators: weaned off midodrine now; will start at follow up.  Beta-Blocker:holding due to low output heart failure; dig , repeat digoxin level today; start coreg 3.125mg  BID. AVW:[id number] 25mg  daily Cardiometabolic:jardiance 25mg  daily Devices therapies & Valvulopathies:not currently indicated Advanced therapies:not a candidate currently.   2. CAD - s/p PCI in 11/24/18  to the LAD & RCA with anterior wall hypokinesis.  - continue asa 81mg  daily & lipitor 80mg  daily  - no chest pain  3. LV thrombus - continue apixaban 5mg  BID  4. Polysubstance abuse - discussed importance of drug cessation; so far he reports doing well with this.   5. Chronic venous insufficency with LE ulcers - Followed by Dr. Lajoyce Corners; last seen on 06/09/23 for massive venous stasis ulcers on both legs.  - Vashe dressings and compression wraps applied with plan for follow up later this week.   I spent 35 minutes caring for this patient today including face to face time, ordering and reviewing labs, discussing importance of medication compliance extensively, reviewing echo with him again, seeing the patient, documenting in the record, and arranging follow ups.   Zyier Dykema Advanced Heart Failure Mechanical Circulatory Support

## 2023-08-14 NOTE — Progress Notes (Signed)
 Office Visit Note   Patient: Evan [name]           Date of Birth: 08/05/1965           MRN: [id number] Visit Date: 08/11/2023              Requested by: Dettinger, Elige Radon, MD [address] [place],  [place] [address] PCP: Dettinger, Elige Radon, MD  Chief Complaint  Patient presents with   Left Leg - Follow-up   Right Leg - Follow-up      HPI: The patient is a 58 year old gentleman who is seen status post left medial ankle ulcer as well as right calf ulcer he most recently did advance to medical compression stockings with direct skin contact.  He has not liked this approach she does not like when these are donned and off have a debride the eschar and nonviable tissue from his wounds.  He continues to have some moderate drainage Assessment & Plan: Visit Diagnoses: No diagnosis found.  Plan: Will reapply compression wraps today with and Dynaflex discussed elevation  Follow-Up Instructions: No follow-ups on file.   Ortho Exam  Patient is alert, oriented, no adenopathy, well-dressed, normal affect, normal respiratory effort. On examination bilateral lower extremities there is healthy granulation tissue in the base of all ulcers.  Please see attached images.  The medial calf ulcer on the right measures 6 cm x 2 cm filled in with over 50% eschar.  The left medial malleolus ulcer today is 2-1/2 x 2-1/2 cm with 2 mm of depth.  The left medial calf ulcer is 3 cm x 2 cm the lateral malleolus ulcer is 1 cm x 1 cm with 2 mm of depth  Imaging: No results found.       Labs: Lab Results  Component Value Date   HGBA1C 7.3 (H) 04/19/2023   HGBA1C 7.4 (H) 02/11/2023   HGBA1C 7.6 (H) 11/10/2022   ESRSEDRATE 3 05/05/2023   ESRSEDRATE 1 05/05/2023   CRP 2.0 (H) 05/05/2023   CRP 2.5 (H) 05/05/2023   LABURIC 7.1 10/12/2019   LABURIC 6.1 11/24/2018   LABURIC 5.9 02/04/2017   REPTSTATUS 05/10/2023 FINAL 05/05/2023   CULT  05/05/2023    NO GROWTH 5 DAYS Performed at [facility], [address], [place], [place] [address]      Lab Results  Component Value Date   ALBUMIN 3.9 07/29/2023   ALBUMIN 2.9 (L) 06/10/2023   ALBUMIN 3.1 (L) 05/28/2023   PREALBUMIN 12 (L) 05/05/2023    Lab Results  Component Value Date   MG 2.2 06/02/2023   MG 2.0 06/01/2023   MG 2.2 05/31/2023   No results found for: "VD25OH"  Lab Results  Component Value Date   PREALBUMIN 12 (L) 05/05/2023      Latest Ref Rng & Units 07/29/2023    3:19 PM 06/01/2023    7:17 AM 05/31/2023    4:44 AM  CBC EXTENDED  WBC 3.4 - 10.8 x10E3/uL 8.6  10.0  10.3   RBC 4.14 - 5.80 x10E6/uL 6.21  5.62  5.62   Hemoglobin 13.0 - 17.7 g/dL 01.0  27.2  53.6   HCT 37.5 - 51.0 % 49.0  45.0  44.8   Platelets 150 - 450 x10E3/uL 281  254  244   NEUT# 1.4 - 7.0 x10E3/uL 6.5     Lymph# 0.7 - 3.1 x10E3/uL 1.4        There is no height or weight on  file to calculate BMI.  Orders:  No orders of the defined types were placed in this encounter.  No orders of the defined types were placed in this encounter.    Procedures: No procedures performed  Clinical Data: No additional findings.  ROS:  All other systems negative, except as noted in the HPI. Review of Systems  Objective: Vital Signs: There were no vitals taken for this visit.  Specialty Comments:  No specialty comments available.  PMFS History: Patient Active Problem List   Diagnosis Date Noted   Polysubstance abuse (HCC) 06/17/2023   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) 05/28/2023   Lymphedema 05/28/2023   Nonsustained ventricular tachycardia (HCC) 05/28/2023   History of coronary artery stent placement 05/28/2023   Left ventricular thrombus 05/28/2023   Pulmonary hypertension, unspecified (HCC) 05/28/2023   OSA (obstructive sleep apnea) 05/28/2023   Cocaine use 05/28/2023   Atherosclerosis of native coronary artery of native heart without angina pectoris  05/28/2023   Biventricular heart failure (HCC) 05/28/2023   Acute decompensated heart failure (HCC) 05/28/2023   Acute HFrEF (heart failure with reduced ejection fraction) (HCC) 05/27/2023   Diabetic foot ulcer (HCC) 05/05/2023   History of acute cholecystitis 04/18/23 treated conservatively 05/05/2023   Acute on chronic HFrEF (heart failure with reduced ejection fraction) (HCC) 05/05/2023   Diabetic ulcer of lower leg (HCC) 05/05/2023   Cellulitis in diabetic foot (HCC) 05/05/2023   CHF (congestive heart failure) (HCC) 05/05/2023   Bilateral leg ulcer (HCC) 05/05/2023   Acute on chronic combined systolic and diastolic CHF (congestive heart failure) (HCC) 05/05/2023   Vasculitis (HCC) 05/05/2023   Abdominal pain 04/19/2023   Elevated brain natriuretic peptide (BNP) level 04/19/2023   Elevated troponin 04/19/2023   Elevated d-dimer 04/19/2023   Type 2 diabetes mellitus with hyperglycemia (HCC) 04/19/2023   Acute cholecystitis 04/18/2023   Hypertension 10/06/2022   Type 2 diabetes mellitus with complications (HCC) 02/15/2021   CAD S/P PCI 12/09/2018   Ischemic cardiomyopathy 12/09/2018   Left ventricular dysfunction    History of non-ST elevation myocardial infarction (NSTEMI) 11/24/2018   Obesity (BMI 30.0-34.9) 01/05/2018   GERD (gastroesophageal reflux disease) 07/08/2017   Mixed hyperlipidemia 12/22/2016   Essential hypertension 09/22/2016   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) 09/22/2016   Gout 09/22/2016   Past Medical History:  Diagnosis Date   Arthritis    Atrial fibrillation (HCC)    CHF (congestive heart failure) (HCC)    Diabetes mellitus without complication (HCC)    Gout    Heart attack (HCC) 10/2018   Hyperlipidemia    Hypertension    Morbid obesity (HCC) 12/22/2016   Sleep apnea    had test twice unable to complete    Family History  Problem Relation Age of Onset   Diabetes Mother    Stroke Mother    Early death Father    Heart attack  Father        died from heart attack at the age of 19   Alcohol abuse Father    Heart disease Father    Breast cancer Sister    Hypertension Brother    Heart attack Maternal Grandfather        died from heart attack at the age of 70   Early death Paternal Grandfather    Heart attack Paternal Grandfather    Colon cancer Neg Hx    Esophageal cancer Neg Hx    Rectal cancer Neg Hx  Stomach cancer Neg Hx     Past Surgical History:  Procedure Laterality Date   ANKLE ARTHROSCOPY Right    COLONOSCOPY WITH PROPOFOL N/A 09/18/2020   Procedure: COLONOSCOPY WITH PROPOFOL;  Surgeon: Sherrilyn Rist, MD;  Location: [facility];  Service: Gastroenterology;  Laterality: N/A;  09-18-2020   CORONARY STENT INTERVENTION N/A 11/24/2018   Procedure: CORONARY STENT INTERVENTION;  Surgeon: Iran Ouch, MD;  Location: [facility];  Service: Cardiovascular;  Laterality: N/A;  prox and mid LAD   CORONARY STENT INTERVENTION N/A 11/26/2018   Procedure: CORONARY STENT INTERVENTION;  Surgeon: Iran Ouch, MD;  Location: [facility];  Service: Cardiovascular;  Laterality: N/A;   CYST REMOVAL HAND     KNEE ARTHROSCOPY Left    LEFT HEART CATH AND CORONARY ANGIOGRAPHY N/A 11/24/2018   Procedure: LEFT HEART CATH AND CORONARY ANGIOGRAPHY;  Surgeon: Iran Ouch, MD;  Location: [facility];  Service: Cardiovascular;  Laterality: N/A;   POLYPECTOMY  09/18/2020   Procedure: POLYPECTOMY;  Surgeon: Sherrilyn Rist, MD;  Location: [facility];  Service: Gastroenterology;;   Social History   Occupational History   Occupation: Disability  Tobacco Use   Smoking status: Never   Smokeless tobacco: Never  Vaping Use   Vaping status: Never Used  Substance and Sexual Activity   Alcohol use: Not Currently    Alcohol/week: 12.0 standard drinks of alcohol    Types: 12 Cans of beer per week   Drug use: No   Sexual activity: Yes    Comment: male 8 months partner

## 2023-08-14 NOTE — Patient Instructions (Addendum)
 Medication Changes:  START CARVEDILOL 3.125 MG TWICE DAILY  USE THE VENTOLIN INHALER AS NEEDED  Lab Work:  Go DOWN to LOWER LEVEL (LL) to have your blood work completed inside of [employer] office.  We will only call you if the results are abnormal or if the provider would like to make medication changes.   Testing/Procedures:  Please have your echo completed TUESDAY, MAY 13TH AT 9:00 AM. You will check in for this at the [facility]. You have to arrive 15 MINS EARLY for preparation, otherwise you will have to reschedule.   Follow-Up in: 1 MONTH WITH DR. Gasper Lloyd, TUESDAY, MAY 13TH AT 10:00 AM.  At the [facility], you and your health needs are our priority. We have a designated team specialized in the treatment of Heart Failure. This Care Team includes your primary Heart Failure Specialized Cardiologist (physician), Advanced Practice Providers (APPs- Physician Assistants and Nurse Practitioners), and Pharmacist who all work together to provide you with the care you need, when you need it.   You may see any of the following providers on your designated Care Team at your next follow up:  Dr. Arvilla Meres Dr. Marca Ancona Dr. Dorthula Nettles Dr. Theresia Bough  Please be sure to bring in all your medications bottles to every appointment.   Need to Contact us:  If you have any questions or concerns before your next appointment please send [place] a message through [place] or call our office at [phone].    TO LEAVE A MESSAGE FOR THE NURSE SELECT OPTION 2, PLEASE LEAVE A MESSAGE INCLUDING: YOUR NAME DATE OF BIRTH CALL BACK NUMBER REASON FOR CALL**this is important as we prioritize the call backs  YOU WILL RECEIVE A CALL BACK THE SAME DAY AS LONG AS YOU CALL BEFORE 4:00 PM

## 2023-08-17 ENCOUNTER — Ambulatory Visit: Admitting: Family Medicine

## 2023-08-17 ENCOUNTER — Other Ambulatory Visit (HOSPITAL_COMMUNITY): Payer: Self-pay

## 2023-08-17 ENCOUNTER — Other Ambulatory Visit: Payer: Self-pay

## 2023-08-17 ENCOUNTER — Encounter: Payer: Self-pay | Admitting: Family Medicine

## 2023-08-17 VITALS — BP 100/71 | HR 97 | Ht 77.0 in | Wt 225.0 lb

## 2023-08-17 DIAGNOSIS — I10 Essential (primary) hypertension: Secondary | ICD-10-CM | POA: Diagnosis not present

## 2023-08-17 DIAGNOSIS — Z7984 Long term (current) use of oral hypoglycemic drugs: Secondary | ICD-10-CM | POA: Diagnosis not present

## 2023-08-17 DIAGNOSIS — E11628 Type 2 diabetes mellitus with other skin complications: Secondary | ICD-10-CM | POA: Diagnosis not present

## 2023-08-17 DIAGNOSIS — L84 Corns and callosities: Secondary | ICD-10-CM | POA: Diagnosis not present

## 2023-08-17 DIAGNOSIS — F5089 Other specified eating disorder: Secondary | ICD-10-CM

## 2023-08-17 DIAGNOSIS — E118 Type 2 diabetes mellitus with unspecified complications: Secondary | ICD-10-CM | POA: Diagnosis not present

## 2023-08-17 DIAGNOSIS — E782 Mixed hyperlipidemia: Secondary | ICD-10-CM

## 2023-08-17 MED ORDER — LANTUS SOLOSTAR 100 UNIT/ML ~~LOC~~ SOPN
10.0000 [IU] | PEN_INJECTOR | Freq: Every day | SUBCUTANEOUS | 3 refills | Status: AC
  Filled 2023-08-17: qty 9, 84d supply, fill #0

## 2023-08-17 NOTE — Progress Notes (Signed)
 BP 100/71   Pulse 97   Ht 6\' 5"  (1.956 m)   Wt 225 lb (102.1 kg)   SpO2 97%   BMI 26.68 kg/m    Subjective:   Patient ID: Evan [name], male    DOB: 06-14-1965, 58 y.o.   MRN: [id number]  HPI: Evan [name] is a 58 y.o. male presenting on 08/17/2023 for Medical Management of Chronic Issues, Diabetes, and off balance   HPI Type 2 diabetes mellitus Patient comes in today for recheck of his diabetes. Patient has been currently taking Jardiance. Patient is not currently on an ACE inhibitor/ARB. Patient has not seen an ophthalmologist this year. Patient denies any new issues with their feet. The symptom started onset as an adult hypertension and hyperlipidemia and CHF ARE RELATED TO DM   Hypertension and CHF, sees cardiology Patient is currently on spironolactone and torsemide and digoxin and carvedilol, and their blood pressure today is 100/71.  Patient does have some lightheadedness and dizziness, I do not know what they could adjust back but instructed he needs to talk to cardiology. Patient denies headaches, blurred vision, chest pains, shortness of breath, or weakness. Denies any side effects from medication and is content with current medication.   Hyperlipidemia Patient is coming in for recheck of his hyperlipidemia. The patient is currently taking atorvastatin and fenofibrate. They deny any issues with myalgias or history of liver damage from it. They deny any focal numbness or weakness or chest pain.   Patient has been having more and more decreased sense of taste and that things taste different to them and he feels like he only wants to eat ice.  Relevant past medical, surgical, family and social history reviewed and updated as indicated. Interim medical history since our last visit reviewed. Allergies and medications reviewed and updated.  Review of Systems  Constitutional:  Negative for chills and fever.  Eyes:  Negative for visual disturbance.  Respiratory:  Negative for  shortness of breath and wheezing.   Cardiovascular:  Negative for chest pain and leg swelling.  Musculoskeletal:  Negative for back pain and gait problem.  Skin:  Negative for rash.  Neurological:  Negative for dizziness, weakness and light-headedness.  All other systems reviewed and are negative.   Per HPI unless specifically indicated above   Allergies as of 08/17/2023       Reactions   Metoprolol    Hypotension         Medication List        Accurate as of August 17, 2023  4:21 PM. If you have any questions, ask your nurse or doctor.          Accu-Chek Aviva device Test BS BID Dx E11.69   Accu-Chek Aviva Plus w/Device Kit 1 each by Does not apply route 4 (four) times daily.   Accu-Chek Guide test strip Generic drug: glucose blood Check BS in the morning and at bedtime Dx E11.8   acetaminophen 325 MG tablet Commonly known as: TYLENOL Take 2 tablets (650 mg total) by mouth every 6 (six) hours as needed for mild pain (pain score 1-3) or fever (or Fever >/= 101).   apixaban 5 MG Tabs tablet Commonly known as: ELIQUIS Take 1 tablet (5 mg total) by mouth 2 (two) times daily.   aspirin EC 81 MG tablet Take 1 tablet (81 mg total) by mouth daily with breakfast. Swallow whole.   atorvastatin 80 MG tablet Commonly known as: LIPITOR Take 1 tablet (80 mg  total) by mouth daily.   BD Pen Needle Nano 2nd Gen 32G X 4 MM Misc Generic drug: Insulin Pen Needle Use daily with insulin Dx E11.9, Z79.4   carvedilol 3.125 MG tablet Commonly known as: Coreg Take 1 tablet (3.125 mg total) by mouth 2 (two) times daily.   digoxin 0.125 MG tablet Commonly known as: LANOXIN Take 1 tablet (0.125 mg total) by mouth daily.   empagliflozin 25 MG Tabs tablet Commonly known as: Jardiance Take 1 tablet (25 mg total) by mouth daily.   famotidine 20 MG tablet Commonly known as: Pepcid Take 1 tablet (20 mg total) by mouth 2 (two) times daily.   fenofibrate 145 MG tablet Commonly  known as: TRICOR Take 1 tablet (145 mg total) by mouth daily.   fluticasone 50 MCG/ACT nasal spray Commonly known as: FLONASE Place 1 spray into both nostrils 2 (two) times daily as needed for allergies or rhinitis.   hydrOXYzine 25 MG capsule Commonly known as: VISTARIL Take 1 capsule (25 mg total) by mouth every 8 (eight) hours as needed.   Lantus SoloStar 100 UNIT/ML Solostar Pen Generic drug: insulin glargine Inject 10 Units into the skin daily. Started by: Elige Radon Zaire Levesque   pantoprazole 40 MG tablet Commonly known as: PROTONIX Take 40 mg by mouth daily.   potassium chloride SA 20 MEQ tablet Commonly known as: KLOR-CON M Take 2 tablets (40 mEq total) by mouth daily.   spironolactone 25 MG tablet Commonly known as: ALDACTONE Take 0.5 tablets (12.5 mg total) by mouth daily.   torsemide 20 MG tablet Commonly known as: DEMADEX Take 2 tablets (40 mg total) by mouth 2 (two) times daily.   Ventolin HFA 108 (90 Base) MCG/ACT inhaler Generic drug: albuterol Inhale 2 puffs into the lungs as needed for wheezing or shortness of breath.         Objective:   BP 100/71   Pulse 97   Ht 6\' 5"  (1.956 m)   Wt 225 lb (102.1 kg)   SpO2 97%   BMI 26.68 kg/m   Wt Readings from Last 3 Encounters:  08/17/23 225 lb (102.1 kg)  08/14/23 230 lb 3.2 oz (104.4 kg)  07/29/23 232 lb 12.8 oz (105.6 kg)    Physical Exam Vitals and nursing note reviewed.  Constitutional:      General: He is not in acute distress.    Appearance: He is well-developed. He is not diaphoretic.  Eyes:     General: No scleral icterus.    Conjunctiva/sclera: Conjunctivae normal.  Neck:     Thyroid: No thyromegaly.  Cardiovascular:     Rate and Rhythm: Normal rate and regular rhythm.     Heart sounds: Normal heart sounds. No murmur heard. Pulmonary:     Effort: Pulmonary effort is normal. No respiratory distress.     Breath sounds: Normal breath sounds. No wheezing.  Musculoskeletal:         General: No swelling. Normal range of motion.     Cervical back: Neck supple.  Lymphadenopathy:     Cervical: No cervical adenopathy.  Skin:    General: Skin is warm and dry.     Findings: No rash.  Neurological:     Mental Status: He is alert and oriented to person, place, and time.     Coordination: Coordination normal.  Psychiatric:        Behavior: Behavior normal.       Assessment & Plan:   Problem List Items Addressed This Visit  Cardiovascular and Mediastinum   Essential hypertension     Endocrine   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)   Relevant Medications   insulin glargine (LANTUS SOLOSTAR) 100 UNIT/ML Solostar Pen   Type 2 diabetes mellitus with complications (HCC) - Primary   Relevant Medications   insulin glargine (LANTUS SOLOSTAR) 100 UNIT/ML Solostar Pen   Other Relevant Orders   Bayer DCA Hb A1c Waived   Lipid panel     Other   Mixed hyperlipidemia   Other Visit Diagnoses       Pica       Relevant Orders   Iron, TIBC and Ferritin Panel       A1c 7.8 which is up from his last time at 7.3.  Will start Lantus 10 units daily, that will help with appetite increase for him and hopefully will help himself losing weight as well as well as control his blood sugar.  Blood pressure on the lower side, instructed him to talk with cardiology because most of the medicines are prescribed from them for congestive heart failure which she is struggled with. Follow up plan: Return in about 3 months (around 11/16/2023), or if symptoms worsen or fail to improve, for Diabetes and hypertension.  Counseling provided for all of the vaccine components Orders Placed This Encounter  Procedures   Bayer DCA Hb A1c Waived   Lipid panel   Iron, TIBC and Ferritin Panel    Arville Care, MD [facility] 08/17/2023, 4:21 PM

## 2023-08-18 ENCOUNTER — Encounter: Payer: Self-pay | Admitting: Family

## 2023-08-18 ENCOUNTER — Ambulatory Visit: Admitting: Family

## 2023-08-18 DIAGNOSIS — I87333 Chronic venous hypertension (idiopathic) with ulcer and inflammation of bilateral lower extremity: Secondary | ICD-10-CM

## 2023-08-18 DIAGNOSIS — I890 Lymphedema, not elsewhere classified: Secondary | ICD-10-CM | POA: Diagnosis not present

## 2023-08-18 DIAGNOSIS — L97912 Non-pressure chronic ulcer of unspecified part of right lower leg with fat layer exposed: Secondary | ICD-10-CM | POA: Diagnosis not present

## 2023-08-18 DIAGNOSIS — L97922 Non-pressure chronic ulcer of unspecified part of left lower leg with fat layer exposed: Secondary | ICD-10-CM | POA: Diagnosis not present

## 2023-08-18 LAB — LIPID PANEL
Chol/HDL Ratio: 4.2 ratio (ref 0.0–5.0)
Cholesterol, Total: 104 mg/dL (ref 100–199)
HDL: 25 mg/dL — ABNORMAL LOW (ref >39)
LDL Chol Calc (NIH): 56 mg/dL (ref 0–99)
Triglycerides: 124 mg/dL (ref 0–149)
VLDL Cholesterol Cal: 23 mg/dL (ref 5–40)

## 2023-08-18 LAB — BAYER DCA HB A1C WAIVED: HB A1C (BAYER DCA - WAIVED): 7.8 % — ABNORMAL HIGH (ref 4.8–5.6)

## 2023-08-18 LAB — IRON,TIBC AND FERRITIN PANEL
Ferritin: 45 ng/mL (ref 30–400)
Iron Saturation: 8 % — CL (ref 15–55)
Iron: 47 ug/dL (ref 38–169)
Total Iron Binding Capacity: 570 ug/dL (ref 250–450)
UIBC: 523 ug/dL — ABNORMAL HIGH (ref 111–343)

## 2023-08-18 NOTE — Progress Notes (Signed)
 Office Visit Note   Patient: Evan [name]           Date of Birth: 1965-11-21           MRN: [id number] Visit Date: 08/18/2023              Requested by: Dettinger, Elige Radon, MD [address] [place],  [place] [address] PCP: Dettinger, Elige Radon, MD  Chief Complaint  Patient presents with   Right Leg - Follow-up   Left Leg - Follow-up      HPI: Patient is a 58 year old gentleman who is seen for evaluation of ongoing chronic ulcers to bilateral mid malleolar eye as well as his right calf he had been in compression garments but recently was placed back in Dynaflex compression for worsening of his swelling and ulcerations.  He presents today in 1 week follow-up  Assessment & Plan: Visit Diagnoses: No diagnosis found.  Plan: Recommended resuming compression stockings these were reapplied today.  He reports that he will purchase a second pair so he can change them daily  Follow-Up Instructions: No follow-ups on file.   Ortho Exam  Patient is alert, oriented, no adenopathy, well-dressed, normal affect, normal respiratory effort. The medial calf ulcer on the right today measures 5-1/2 x 3 cm the lateral malleolus 1.5 cm x 0.8 cm the medial malleolus.  The medial malleolus ulcer Chrismon 0.8 cm x 2.2 cm  Imaging: No results found.         Labs: Lab Results  Component Value Date   HGBA1C 7.3 (H) 04/19/2023   HGBA1C 7.4 (H) 02/11/2023   HGBA1C 7.6 (H) 11/10/2022   ESRSEDRATE 3 05/05/2023   ESRSEDRATE 1 05/05/2023   CRP 2.0 (H) 05/05/2023   CRP 2.5 (H) 05/05/2023   LABURIC 7.1 10/12/2019   LABURIC 6.1 11/24/2018   LABURIC 5.9 02/04/2017   REPTSTATUS 05/10/2023 FINAL 05/05/2023   CULT  05/05/2023    NO GROWTH 5 DAYS Performed at [facility], [address] [place], [place] [address]      Lab Results  Component Value Date   ALBUMIN 3.9 07/29/2023   ALBUMIN 2.9 (L) 06/10/2023   ALBUMIN 3.1 (L) 05/28/2023   PREALBUMIN 12 (L) 05/05/2023    Lab Results   Component Value Date   MG 2.2 06/02/2023   MG 2.0 06/01/2023   MG 2.2 05/31/2023   No results found for: "VD25OH"  Lab Results  Component Value Date   PREALBUMIN 12 (L) 05/05/2023      Latest Ref Rng & Units 07/29/2023    3:19 PM 06/01/2023    7:17 AM 05/31/2023    4:44 AM  CBC EXTENDED  WBC 3.4 - 10.8 x10E3/uL 8.6  10.0  10.3   RBC 4.14 - 5.80 x10E6/uL 6.21  5.62  5.62   Hemoglobin 13.0 - 17.7 g/dL 47.4  25.9  56.3   HCT 37.5 - 51.0 % 49.0  45.0  44.8   Platelets 150 - 450 x10E3/uL 281  254  244   NEUT# 1.4 - 7.0 x10E3/uL 6.5     Lymph# 0.7 - 3.1 x10E3/uL 1.4        There is no height or weight on file to calculate BMI.  Orders:  No orders of the defined types were placed in this encounter.  No orders of the defined types were placed in this encounter.    Procedures: No procedures performed  Clinical Data: No additional findings.  ROS:  All other systems  negative, except as noted in the HPI. Review of Systems  Objective: Vital Signs: There were no vitals taken for this visit.  Specialty Comments:  No specialty comments available.  PMFS History: Patient Active Problem List   Diagnosis Date Noted   Polysubstance abuse (HCC) 06/17/2023   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) 05/28/2023   Lymphedema 05/28/2023   Nonsustained ventricular tachycardia (HCC) 05/28/2023   History of coronary artery stent placement 05/28/2023   Left ventricular thrombus 05/28/2023   Pulmonary hypertension, unspecified (HCC) 05/28/2023   OSA (obstructive sleep apnea) 05/28/2023   Cocaine use 05/28/2023   Atherosclerosis of native coronary artery of native heart without angina pectoris 05/28/2023   Biventricular heart failure (HCC) 05/28/2023   Acute decompensated heart failure (HCC) 05/28/2023   Acute HFrEF (heart failure with reduced ejection fraction) (HCC) 05/27/2023   Diabetic foot ulcer (HCC) 05/05/2023    History of acute cholecystitis 04/18/23 treated conservatively 05/05/2023   Acute on chronic HFrEF (heart failure with reduced ejection fraction) (HCC) 05/05/2023   Diabetic ulcer of lower leg (HCC) 05/05/2023   Cellulitis in diabetic foot (HCC) 05/05/2023   CHF (congestive heart failure) (HCC) 05/05/2023   Bilateral leg ulcer (HCC) 05/05/2023   Acute on chronic combined systolic and diastolic CHF (congestive heart failure) (HCC) 05/05/2023   Vasculitis (HCC) 05/05/2023   Abdominal pain 04/19/2023   Elevated brain natriuretic peptide (BNP) level 04/19/2023   Elevated troponin 04/19/2023   Elevated d-dimer 04/19/2023   Type 2 diabetes mellitus with hyperglycemia (HCC) 04/19/2023   Acute cholecystitis 04/18/2023   Hypertension 10/06/2022   Type 2 diabetes mellitus with complications (HCC) 02/15/2021   CAD S/P PCI 12/09/2018   Ischemic cardiomyopathy 12/09/2018   Left ventricular dysfunction    History of non-ST elevation myocardial infarction (NSTEMI) 11/24/2018   Obesity (BMI 30.0-34.9) 01/05/2018   GERD (gastroesophageal reflux disease) 07/08/2017   Mixed hyperlipidemia 12/22/2016   Essential hypertension 09/22/2016   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) 09/22/2016   Gout 09/22/2016   Past Medical History:  Diagnosis Date   Arthritis    Atrial fibrillation (HCC)    CHF (congestive heart failure) (HCC)    Diabetes mellitus without complication (HCC)    Gout    Heart attack (HCC) 10/2018   Hyperlipidemia    Hypertension    Morbid obesity (HCC) 12/22/2016   Sleep apnea    had test twice unable to complete    Family History  Problem Relation Age of Onset   Diabetes Mother    Stroke Mother    Early death Father    Heart attack Father        died from heart attack at the age of 81   Alcohol abuse Father    Heart disease Father    Breast cancer Sister    Hypertension Brother    Heart attack Maternal Grandfather        died from heart attack at the age of 44    Early death Paternal Grandfather    Heart attack Paternal Grandfather    Colon cancer Neg Hx    Esophageal cancer Neg Hx    Rectal cancer Neg Hx    Stomach cancer Neg Hx     Past Surgical History:  Procedure Laterality Date   ANKLE ARTHROSCOPY Right    COLONOSCOPY WITH PROPOFOL N/A 09/18/2020   Procedure: COLONOSCOPY WITH PROPOFOL;  Surgeon: Sherrilyn Rist, MD;  Location: [facility];  Service: Gastroenterology;  Laterality: N/A;  09-18-2020   CORONARY STENT INTERVENTION N/A 11/24/2018   Procedure: CORONARY STENT INTERVENTION;  Surgeon: Iran Ouch, MD;  Location: [facility];  Service: Cardiovascular;  Laterality: N/A;  prox and mid LAD   CORONARY STENT INTERVENTION N/A 11/26/2018   Procedure: CORONARY STENT INTERVENTION;  Surgeon: Iran Ouch, MD;  Location: [facility];  Service: Cardiovascular;  Laterality: N/A;   CYST REMOVAL HAND     KNEE ARTHROSCOPY Left    LEFT HEART CATH AND CORONARY ANGIOGRAPHY N/A 11/24/2018   Procedure: LEFT HEART CATH AND CORONARY ANGIOGRAPHY;  Surgeon: Iran Ouch, MD;  Location: [facility];  Service: Cardiovascular;  Laterality: N/A;   POLYPECTOMY  09/18/2020   Procedure: POLYPECTOMY;  Surgeon: Sherrilyn Rist, MD;  Location: [facility];  Service: Gastroenterology;;   Social History   Occupational History   Occupation: Disability  Tobacco Use   Smoking status: Never   Smokeless tobacco: Never  Vaping Use   Vaping status: Never Used  Substance and Sexual Activity   Alcohol use: Not Currently    Alcohol/week: 12.0 standard drinks of alcohol    Types: 12 Cans of beer per week   Drug use: No   Sexual activity: Yes    Comment: male 8 months partner

## 2023-08-19 ENCOUNTER — Other Ambulatory Visit: Payer: Self-pay

## 2023-08-19 ENCOUNTER — Encounter: Payer: Self-pay | Admitting: Family Medicine

## 2023-08-19 DIAGNOSIS — E611 Iron deficiency: Secondary | ICD-10-CM

## 2023-08-21 ENCOUNTER — Inpatient Hospital Stay

## 2023-08-21 ENCOUNTER — Inpatient Hospital Stay: Attending: Oncology | Admitting: Oncology

## 2023-08-21 ENCOUNTER — Encounter: Payer: Self-pay | Admitting: Oncology

## 2023-08-21 VITALS — BP 106/74 | HR 96 | Temp 95.1°F | Resp 16 | Ht 77.0 in | Wt 221.6 lb

## 2023-08-21 DIAGNOSIS — E611 Iron deficiency: Secondary | ICD-10-CM | POA: Insufficient documentation

## 2023-08-21 DIAGNOSIS — Z86718 Personal history of other venous thrombosis and embolism: Secondary | ICD-10-CM | POA: Insufficient documentation

## 2023-08-21 DIAGNOSIS — I513 Intracardiac thrombosis, not elsewhere classified: Secondary | ICD-10-CM

## 2023-08-21 DIAGNOSIS — Z7901 Long term (current) use of anticoagulants: Secondary | ICD-10-CM | POA: Insufficient documentation

## 2023-08-21 MED ORDER — FERROUS SULFATE 325 (65 FE) MG PO TBEC: 325.0000 mg | DELAYED_RELEASE_TABLET | ORAL | 3 refills | Status: AC

## 2023-08-21 NOTE — Patient Instructions (Signed)
 VISIT SUMMARY:  During your visit, we discussed your ongoing fatigue, insomnia, low iron levels, and recent weight loss. We also reviewed your history of heart disease, blood clot, and gastrointestinal issues. A plan was made to address your low iron levels and to follow up with a gastroenterologist for further evaluation.  YOUR PLAN:  -IRON DEFICIENCY WITHOUT ANEMIA: Your iron levels are low, which may be due to gastrointestinal bleeding, especially given your history of polyps and occasional black stools. To address this, you will start taking oral iron supplements every other day and receive two doses of IV iron, one week apart. You will also be referred to a gastroenterologist for further evaluation and a possible colonoscopy.  -FATIGUE: Your fatigue is likely related to your low iron levels. We expect improvement with the iron supplementation plan we have put in place.   INSTRUCTIONS:  Please follow up in six weeks to assess your response to the iron supplementation and to evaluate your low iron levels. Additionally, schedule a follow-up appointment after your evaluation with the gastroenterologist.

## 2023-08-21 NOTE — Assessment & Plan Note (Signed)
 Patient has iron deficiency without anemia.  Last colonoscopy showed multiple polyps.  Patient reports weight loss, loss of appetite and occasional dark-colored stools.  Patient also has a history of extensive alcohol use -Start oral iron supplementation every other day.  Use MiraLAX for constipation -The most likely cause of his anemia is due to chronic blood loss. We discussed some of the risks, benefits, and alternatives of intravenous iron infusions. The patient is symptomatic from iron deficiency and the iron level is critically low. He desires to achieve higher levels of iron faster for adequate hematopoesis. Some of the side-effects to be expected including risks of infusion reactions, phlebitis, headaches, nausea and fatigue.  The patient is willing to proceed. Patient education material was dispensed.  Goal is to keep ferritin level greater than 50 and TSAT above 20.  - Include proteins and grains in diet. -GI referral for iron deficiency evaluation  Return to clinic in 6 weeks with labs to assess for response

## 2023-08-21 NOTE — Progress Notes (Signed)
 [facility] at [facility]  HEMATOLOGY NEW VISIT  Dettinger, Elige Radon, MD  REASON FOR REFERRAL: Iron deficiency  HISTORY OF PRESENT ILLNESS: Evan [name] 58 y.o. male referred for iron deficiency.  He has a past medical history of hypertension, CHF, LV thrombus on Eliquis, diabetes, CAD.He reports feeling tired all the time and having difficulty sleeping, with some nights of good sleep and others of tossing and turning.  He reports occasional black stools and does not currently take iron pills.  The patient also reports shortness of breath, especially when walking but attributes it to his heart failure.  He has been taking a diuretic.The patient has lost a significant amount of weight since having a heart attack, going from 345 pounds to 221 pounds. He reports a loss of appetite and a change in taste, leading to a preference for fruits, potatoes, and ice.  He denies blood in stools or melena, abdominal pain, nausea, vomiting.  He was last seen by GI in 2022 at colonoscopy that showed multiple polyps.  He has not seen a GI since then.  Patient does not smoke, was a previous alcoholic and did not have a drink in the past 7 to 8 months.  Does not work and is on disability.  He lives in [place].  I have reviewed the past medical history, past surgical history, social history and family history with the patient   ALLERGIES:  is allergic to metoprolol.  MEDICATIONS:  Current Outpatient Medications  Medication Sig Dispense Refill   acetaminophen (TYLENOL) 325 MG tablet Take 2 tablets (650 mg total) by mouth every 6 (six) hours as needed for mild pain (pain score 1-3) or fever (or Fever >/= 101).     albuterol (VENTOLIN HFA) 108 (90 Base) MCG/ACT inhaler Inhale 2 puffs into the lungs as needed for wheezing or shortness of breath. 18 g 2   apixaban (ELIQUIS) 5 MG TABS tablet Take 1 tablet (5 mg total) by mouth 2 (two) times daily. 60 tablet 2   aspirin EC 81 MG tablet  Take 1 tablet (81 mg total) by mouth daily with breakfast. Swallow whole. 30 tablet 12   atorvastatin (LIPITOR) 80 MG tablet Take 1 tablet (80 mg total) by mouth daily. 90 tablet 3   BD PEN NEEDLE NANO 2ND GEN 32G X 4 MM MISC Use daily with insulin Dx E11.9, Z79.4 100 each 3   Blood Glucose Monitoring Suppl (ACCU-CHEK AVIVA PLUS) w/Device KIT 1 each by Does not apply route 4 (four) times daily. 1 kit 1   Blood Glucose Monitoring Suppl (ACCU-CHEK AVIVA) device Test BS BID Dx E11.69 1 each 0   carvedilol (COREG) 3.125 MG tablet Take 1 tablet (3.125 mg total) by mouth 2 (two) times daily. 60 tablet 11   digoxin (LANOXIN) 0.125 MG tablet Take 1 tablet (0.125 mg total) by mouth daily. 30 tablet 2   empagliflozin (JARDIANCE) 25 MG TABS tablet Take 1 tablet (25 mg total) by mouth daily. 90 tablet 3   famotidine (PEPCID) 20 MG tablet Take 1 tablet (20 mg total) by mouth 2 (two) times daily. 180 tablet 3   fenofibrate (TRICOR) 145 MG tablet Take 1 tablet (145 mg total) by mouth daily. 90 tablet 3   ferrous sulfate 325 (65 FE) MG EC tablet Take 1 tablet (325 mg total) by mouth every other day. 45 tablet 3   fluticasone (FLONASE) 50 MCG/ACT nasal spray Place 1 spray into both nostrils 2 (two) times daily  as needed for allergies or rhinitis. 16 g 6   glucose blood (ACCU-CHEK GUIDE) test strip Check BS in the morning and at bedtime Dx E11.8 200 strip 3   hydrOXYzine (VISTARIL) 25 MG capsule Take 1 capsule (25 mg total) by mouth every 8 (eight) hours as needed. 30 capsule 2   insulin glargine (LANTUS SOLOSTAR) 100 UNIT/ML Solostar Pen Inject 10 Units into the skin daily. 9 mL 3   pantoprazole (PROTONIX) 40 MG tablet Take 40 mg by mouth daily.     potassium chloride SA (KLOR-CON M) 20 MEQ tablet Take 2 tablets (40 mEq total) by mouth daily. 60 tablet 3   spironolactone (ALDACTONE) 25 MG tablet Take 0.5 tablets (12.5 mg total) by mouth daily. 45 tablet 3   torsemide (DEMADEX) 20 MG tablet Take 2 tablets (40 mg  total) by mouth 2 (two) times daily. 120 tablet 3   No current facility-administered medications for this visit.     REVIEW OF SYSTEMS:   Constitutional: Denies fevers, chills or night sweats Eyes: Denies blurriness of vision Ears, nose, mouth, throat, and face: Denies mucositis or sore throat Respiratory: Denies cough, dyspnea or wheezes Cardiovascular: Denies palpitation, chest discomfort or lower extremity swelling Gastrointestinal:  Denies nausea, heartburn or change in bowel habits Skin: Denies abnormal skin rashes Lymphatics: Denies new lymphadenopathy or easy bruising Neurological:Denies numbness, tingling or new weaknesses Behavioral/Psych: Mood is stable, no new changes  All other systems were reviewed with the patient and are negative.  PHYSICAL EXAMINATION:   Vitals:   08/21/23 0942  BP: 106/74  Pulse: 96  Resp: 16  Temp: (!) 95.1 F (35.1 C)  SpO2: 95%    GENERAL:alert, no distress and comfortable SKIN: skin color, texture, turgor are normal, no rashes or significant lesions LUNGS: clear to auscultation and percussion with normal breathing effort HEART: regular rate & rhythm and no murmurs and no lower extremity edema ABDOMEN:abdomen soft, non-tender and normal bowel sounds Musculoskeletal:no cyanosis of digits and no clubbing  NEURO: alert & oriented x 3 with fluent speech  LABORATORY DATA:  I have reviewed the data as listed  Lab Results  Component Value Date   WBC 8.6 07/29/2023   NEUTROABS 6.5 07/29/2023   HGB 15.2 07/29/2023   HCT 49.0 07/29/2023   MCV 79 07/29/2023   PLT 281 07/29/2023      Chemistry      Component Value Date/Time   NA 135 07/29/2023 1519   K 4.3 07/29/2023 1519   CL 98 07/29/2023 1519   CO2 19 (L) 07/29/2023 1519   BUN 24 07/29/2023 1519   CREATININE 1.27 07/29/2023 1519      Component Value Date/Time   CALCIUM 9.1 07/29/2023 1519   ALKPHOS 93 07/29/2023 1519   AST 28 07/29/2023 1519   ALT 17 07/29/2023 1519    BILITOT 1.2 07/29/2023 1519       Latest Reference Range & Units 08/17/23 16:39  Iron 38 - 169 ug/dL 47  UIBC 161 - 096 ug/dL 045 (H)  TIBC 409 - 811 ug/dL 914 (HH)  Ferritin 30 - 400 ng/mL 45  Iron Saturation 15 - 55 % 8 (LL)  (HH): Data is critically high (LL): Data is critically low (H): Data is abnormally high  Colonoscopy: 09/18/2020 Impression: - Four 2 to 5 mm polyps in the ascending colon and in the cecum, removed with a cold snare. Resected and retrieved. - Three 3 to 6 mm polyps in the transverse colon, removed with a cold  snare. Resected and retrieved. - Three 3 to 4 mm polyps in the proximal sigmoid colon and in the transverse colon, removed with a cold snare. Resected and retrieved. - The examination was otherwise normal on direct and retroflexion views.  ASSESSMENT & PLAN:  Patient is a 58 year old male referred for iron deficiency.    Iron deficiency Patient has iron deficiency without anemia.  Last colonoscopy showed multiple polyps.  Patient reports weight loss, loss of appetite and occasional dark-colored stools.  Patient also has a history of extensive alcohol use -Start oral iron supplementation every other day.  Use MiraLAX for constipation -The most likely cause of his anemia is due to chronic blood loss. We discussed some of the risks, benefits, and alternatives of intravenous iron infusions. The patient is symptomatic from iron deficiency and the iron level is critically low. He desires to achieve higher levels of iron faster for adequate hematopoesis. Some of the side-effects to be expected including risks of infusion reactions, phlebitis, headaches, nausea and fatigue.  The patient is willing to proceed. Patient education material was dispensed.  Goal is to keep ferritin level greater than 50 and TSAT above 20.  - Include proteins and grains in diet. -GI referral for iron deficiency evaluation  Return to clinic in 6 weeks with labs to assess for response  LV  (left ventricular) mural thrombus Patient has a history of LV thrombus and is on Eliquis. -No contraindications to Eliquis at this time.  Continue taking Eliquis. -Continue to follow with cardiology   Orders Placed This Encounter  Procedures   Ferritin    Standing Status:   Future    Expected Date:   09/28/2023    Expiration Date:   08/20/2024   Folate    Standing Status:   Future    Expected Date:   09/28/2023    Expiration Date:   08/20/2024   Vitamin B12    Standing Status:   Future    Expected Date:   09/28/2023    Expiration Date:   08/20/2024   CBC with Differential/Platelet    Standing Status:   Future    Expected Date:   09/28/2023    Expiration Date:   08/20/2024   Comprehensive metabolic panel with GFR    Standing Status:   Future    Expected Date:   09/28/2023    Expiration Date:   08/20/2024   Iron and TIBC    Standing Status:   Future    Expected Date:   09/28/2023    Expiration Date:   08/20/2024    The total time spent in the appointment was 40 minutes encounter with patients including review of chart and various tests results, discussions about plan of care and coordination of care plan   All questions were answered. The patient knows to call the clinic with any problems, questions or concerns. No barriers to learning was detected.   Cindie Crumbly, MD 4/4/202510:32 AM

## 2023-08-21 NOTE — Assessment & Plan Note (Signed)
 Patient has a history of LV thrombus and is on Eliquis. -No contraindications to Eliquis at this time.  Continue taking Eliquis. -Continue to follow with cardiology

## 2023-08-28 ENCOUNTER — Inpatient Hospital Stay

## 2023-08-28 VITALS — BP 97/77 | HR 77 | Temp 97.4°F | Resp 17

## 2023-08-28 DIAGNOSIS — E611 Iron deficiency: Secondary | ICD-10-CM

## 2023-08-28 DIAGNOSIS — Z7901 Long term (current) use of anticoagulants: Secondary | ICD-10-CM | POA: Diagnosis not present

## 2023-08-28 DIAGNOSIS — Z86718 Personal history of other venous thrombosis and embolism: Secondary | ICD-10-CM | POA: Diagnosis not present

## 2023-08-28 MED ORDER — SODIUM CHLORIDE 0.9 % IV SOLN
510.0000 mg | Freq: Once | INTRAVENOUS | Status: AC
  Administered 2023-08-28: 510 mg via INTRAVENOUS
  Filled 2023-08-28: qty 510

## 2023-08-28 MED ORDER — SODIUM CHLORIDE 0.9 % IV SOLN
INTRAVENOUS | Status: DC
Start: 2023-08-28 — End: 2023-08-28

## 2023-08-28 NOTE — Patient Instructions (Signed)
 [facility] [place] - A DEPT OF [facility]  Discharge Instructions: Thank you for choosing [facility] to provide your oncology and hematology care.  If you have a lab appointment with the Cancer Center - please note that after April 8th, 2024, all labs will be drawn in the cancer center.  You do not have to check in or register with the main entrance as you have in the past but will complete your check-in in the cancer center.  Wear comfortable clothing and clothing appropriate for easy access to any Portacath or PICC line.   We strive to give you quality time with your provider. You may need to reschedule your appointment if you arrive late (15 or more minutes).  Arriving late affects you and other patients whose appointments are after yours.  Also, if you miss three or more appointments without notifying the office, you may be dismissed from the clinic at the provider's discretion.      For prescription refill requests, have your pharmacy contact our office and allow 72 hours for refills to be completed.    Today you received the following:  Feraheme.  Ferumoxytol Injection What is this medication? FERUMOXYTOL (FER ue MOX i tol) treats low levels of iron in your body (iron deficiency anemia). Iron is a mineral that plays an important role in making red blood cells, which carry oxygen from your lungs to the rest of your body. This medicine may be used for other purposes; ask your health care provider or pharmacist if you have questions. COMMON BRAND NAME(S): Feraheme What should I tell my care team before I take this medication? They need to know if you have any of these conditions: Anemia not caused by low iron levels High levels of iron in the blood Magnetic resonance imaging (MRI) test scheduled An unusual or allergic reaction to iron, other medications, foods, dyes, or preservatives Pregnant or trying to get pregnant Breastfeeding How should I  use this medication? This medication is injected into a vein. It is given by your care team in a hospital or clinic setting. Talk to your care team the use of this medication in children. Special care may be needed. Overdosage: If you think you have taken too much of this medicine contact a poison control center or emergency room at once. NOTE: This medicine is only for you. Do not share this medicine with others. What if I miss a dose? It is important not to miss your dose. Call your care team if you are unable to keep an appointment. What may interact with this medication? Other iron products This list may not describe all possible interactions. Give your health care provider a list of all the medicines, herbs, non-prescription drugs, or dietary supplements you use. Also tell them if you smoke, drink alcohol, or use illegal drugs. Some items may interact with your medicine. What should I watch for while using this medication? Visit your care team for regular checks on your progress. Tell your care team if your symptoms do not start to get better or if they get worse. You may need blood work done while you are taking this medication. You may need to eat more foods that contain iron. Talk to your care team. Foods that contain iron include whole grains or cereals, dried fruits, beans, peas, leafy green vegetables, and organ meats (liver, kidney). What side effects may I notice from receiving this medication? Side effects that you should  report to your care team as soon as possible: Allergic reactions--skin rash, itching, hives, swelling of the face, lips, tongue, or throat Low blood pressure--dizziness, feeling faint or lightheaded, blurry vision Shortness of breath Side effects that usually do not require medical attention (report to your care team if they continue or are bothersome): Flushing Headache Joint pain Muscle pain Nausea Pain, redness, or irritation at injection site This list  may not describe all possible side effects. Call your doctor for medical advice about side effects. You may report side effects to FDA at 1-800-FDA-1088. Where should I keep my medication? This medication is given in a hospital or clinic. It will not be stored at home. NOTE: This sheet is a summary. It may not cover all possible information. If you have questions about this medicine, talk to your doctor, pharmacist, or health care provider.  2024 Elsevier/Gold Standard (2022-12-24 00:00:00)    To help prevent nausea and vomiting after your treatment, we encourage you to take your nausea medication as directed.  BELOW ARE SYMPTOMS THAT SHOULD BE REPORTED IMMEDIATELY: *FEVER GREATER THAN 100.4 F (38 C) OR HIGHER *CHILLS OR SWEATING *NAUSEA AND VOMITING THAT IS NOT CONTROLLED WITH YOUR NAUSEA MEDICATION *UNUSUAL SHORTNESS OF BREATH *UNUSUAL BRUISING OR BLEEDING *URINARY PROBLEMS (pain or burning when urinating, or frequent urination) *BOWEL PROBLEMS (unusual diarrhea, constipation, pain near the anus) TENDERNESS IN MOUTH AND THROAT WITH OR WITHOUT PRESENCE OF ULCERS (sore throat, sores in mouth, or a toothache) UNUSUAL RASH, SWELLING OR PAIN  UNUSUAL VAGINAL DISCHARGE OR ITCHING   Items with * indicate a potential emergency and should be followed up as soon as possible or go to the Emergency Department if any problems should occur.  Please show the CHEMOTHERAPY ALERT CARD or IMMUNOTHERAPY ALERT CARD at check-in to the Emergency Department and triage nurse.  Should you have questions after your visit or need to cancel or reschedule your appointment, please contact [facility] [place] - A DEPT OF Eligh[facility] [phone]  and follow the prompts.  Office hours are 8:00 a.m. to 4:30 p.m. Monday - Friday. Please note that voicemails left after 4:00 p.m. may not be returned until the following business day.  We are closed weekends and major holidays. You have access to a  nurse at all times for urgent questions. Please call the main number to the clinic [phone] and follow the prompts.  For any non-urgent questions, you may also contact your provider using MyChart. We now offer e-Visits for anyone 39 and older to request care online for non-urgent symptoms. For details visit [email or web address].   Also download the MyChart app! Go to the app store, search "MyChart", open the app, select [place], and log in with your MyChart username and password.

## 2023-08-28 NOTE — Progress Notes (Signed)
 Patient presents today for iron infusion.  Patient is in satisfactory condition with no new complaints voiced.  Vital signs are stable.  IV placed in R arm.  IV flushed well with good blood return noted.  We will proceed with infusion per provider orders.    Patient tolerated infusion well with no complaints voiced.  Patient left ambulatory in stable condition.  Vital signs stable at discharge.  Follow up as scheduled.

## 2023-09-01 ENCOUNTER — Ambulatory Visit: Admitting: Orthopedic Surgery

## 2023-09-01 DIAGNOSIS — L97922 Non-pressure chronic ulcer of unspecified part of left lower leg with fat layer exposed: Secondary | ICD-10-CM | POA: Diagnosis not present

## 2023-09-01 DIAGNOSIS — L97912 Non-pressure chronic ulcer of unspecified part of right lower leg with fat layer exposed: Secondary | ICD-10-CM

## 2023-09-04 ENCOUNTER — Inpatient Hospital Stay

## 2023-09-04 ENCOUNTER — Encounter: Payer: Self-pay | Admitting: Orthopedic Surgery

## 2023-09-04 VITALS — BP 97/66 | HR 84 | Temp 97.8°F | Resp 18

## 2023-09-04 DIAGNOSIS — E611 Iron deficiency: Secondary | ICD-10-CM | POA: Diagnosis not present

## 2023-09-04 DIAGNOSIS — Z86718 Personal history of other venous thrombosis and embolism: Secondary | ICD-10-CM | POA: Diagnosis not present

## 2023-09-04 DIAGNOSIS — Z7901 Long term (current) use of anticoagulants: Secondary | ICD-10-CM | POA: Diagnosis not present

## 2023-09-04 MED ORDER — SODIUM CHLORIDE 0.9 % IV SOLN
510.0000 mg | Freq: Once | INTRAVENOUS | Status: AC
  Administered 2023-09-04: 510 mg via INTRAVENOUS
  Filled 2023-09-04: qty 510

## 2023-09-04 MED ORDER — SODIUM CHLORIDE 0.9 % IV SOLN: INTRAVENOUS | Status: DC

## 2023-09-04 NOTE — Patient Instructions (Signed)

## 2023-09-04 NOTE — Progress Notes (Signed)
 Office Visit Note   Patient: Evan [name]           Date of Birth: 1965/12/19           MRN: [id number] Visit Date: 09/01/2023              Requested by: [name], Evan Sabin, MD [address] [place],  [place] [address] PCP: [name], Evan Sabin, MD  Chief Complaint  Patient presents with   Right Leg - Wound Check   Left Leg - Wound Check      HPI: Patient is a 58 year old gentleman status post Kerecis tissue graft for left medial ankle wound.  Patient is pleased with his progress.  Assessment & Plan: Visit Diagnoses:  1. Ulcers of both lower extremities with fat layer exposed (HCC)     Plan: Continue wound care treatment for the left medial ankle ulcer.  Follow-Up Instructions: Return in about 2 weeks (around 09/15/2023).   Ortho Exam  Patient is alert, oriented, no adenopathy, well-dressed, normal affect, normal respiratory effort. Examination patient's ulcers continue to improve.  The left medial ulcer has healthy granulation tissue is 2 x 2 cm.  Imaging:No results found.   Labs: Lab Results  Component Value Date   HGBA1C 7.8 (H) 08/17/2023   HGBA1C 7.3 (H) 04/19/2023   HGBA1C 7.4 (H) 02/11/2023   ESRSEDRATE 3 05/05/2023   ESRSEDRATE 1 05/05/2023   CRP 2.0 (H) 05/05/2023   CRP 2.5 (H) 05/05/2023   LABURIC 7.1 10/12/2019   LABURIC 6.1 11/24/2018   LABURIC 5.9 02/04/2017   REPTSTATUS 05/10/2023 FINAL 05/05/2023   CULT  05/05/2023    NO GROWTH 5 DAYS Performed at [facility], [address], [place], [place] [address]      Lab Results  Component Value Date   ALBUMIN 3.9 07/29/2023   ALBUMIN 2.9 (L) 06/10/2023   ALBUMIN 3.1 (L) 05/28/2023   PREALBUMIN 12 (L) 05/05/2023    Lab Results  Component Value Date   MG 2.2 06/02/2023   MG 2.0 06/01/2023   MG 2.2 05/31/2023   No results found for: "VD25OH"  Lab Results  Component Value Date   PREALBUMIN 12 (L) 05/05/2023      Latest Ref Rng & Units 07/29/2023    3:19 PM 06/01/2023    7:17 AM  05/31/2023    4:44 AM  CBC EXTENDED  WBC 3.4 - 10.8 x10E3/uL 8.6  10.0  10.3   RBC 4.14 - 5.80 x10E6/uL 6.21  5.62  5.62   Hemoglobin 13.0 - 17.7 g/dL 95.2  84.1  32.4   HCT 37.5 - 51.0 % 49.0  45.0  44.8   Platelets 150 - 450 x10E3/uL 281  254  244   NEUT# 1.4 - 7.0 x10E3/uL 6.5     Lymph# 0.7 - 3.1 x10E3/uL 1.4        There is no height or weight on file to calculate BMI.  Orders:  No orders of the defined types were placed in this encounter.  No orders of the defined types were placed in this encounter.    Procedures: No procedures performed  Clinical Data: No additional findings.  ROS:  All other systems negative, except as noted in the HPI. Review of Systems  Objective: Vital Signs: There were no vitals taken for this visit.  Specialty Comments:  No specialty comments available.  PMFS History: Patient Active Problem List   Diagnosis Date Noted   Iron deficiency 08/21/2023   Polysubstance abuse (  HCC) 06/17/2023   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) 05/28/2023   Lymphedema 05/28/2023   Nonsustained ventricular tachycardia (HCC) 05/28/2023   History of coronary artery stent placement 05/28/2023   LV (left ventricular) mural thrombus 05/28/2023   Pulmonary hypertension, unspecified (HCC) 05/28/2023   OSA (obstructive sleep apnea) 05/28/2023   Cocaine use 05/28/2023   Atherosclerosis of native coronary artery of native heart without angina pectoris 05/28/2023   Biventricular heart failure (HCC) 05/28/2023   Acute decompensated heart failure (HCC) 05/28/2023   Acute HFrEF (heart failure with reduced ejection fraction) (HCC) 05/27/2023   Diabetic foot ulcer (HCC) 05/05/2023   History of acute cholecystitis 04/18/23 treated conservatively 05/05/2023   Acute on chronic HFrEF (heart failure with reduced ejection fraction) (HCC) 05/05/2023   Diabetic ulcer of lower leg (HCC) 05/05/2023   Cellulitis  in diabetic foot (HCC) 05/05/2023   CHF (congestive heart failure) (HCC) 05/05/2023   Bilateral leg ulcer (HCC) 05/05/2023   Acute on chronic combined systolic and diastolic CHF (congestive heart failure) (HCC) 05/05/2023   Vasculitis (HCC) 05/05/2023   Abdominal pain 04/19/2023   Elevated brain natriuretic peptide (BNP) level 04/19/2023   Elevated troponin 04/19/2023   Elevated d-dimer 04/19/2023   Type 2 diabetes mellitus with hyperglycemia (HCC) 04/19/2023   Acute cholecystitis 04/18/2023   Hypertension 10/06/2022   Type 2 diabetes mellitus with complications (HCC) 02/15/2021   CAD S/P PCI 12/09/2018   Ischemic cardiomyopathy 12/09/2018   Left ventricular dysfunction    History of non-ST elevation myocardial infarction (NSTEMI) 11/24/2018   Obesity (BMI 30.0-34.9) 01/05/2018   GERD (gastroesophageal reflux disease) 07/08/2017   Mixed hyperlipidemia 12/22/2016   Essential hypertension 09/22/2016   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) 09/22/2016   Gout 09/22/2016   Past Medical History:  Diagnosis Date   Arthritis    Atrial fibrillation (HCC)    CHF (congestive heart failure) (HCC)    Diabetes mellitus without complication (HCC)    Gout    Heart attack (HCC) 10/2018   Hyperlipidemia    Hypertension    Morbid obesity (HCC) 12/22/2016   Sleep apnea    had test twice unable to complete    Family History  Problem Relation Age of Onset   Diabetes Mother    Stroke Mother    Early death Father    Heart attack Father        died from heart attack at the age of 104   Alcohol abuse Father    Heart disease Father    Breast cancer Sister    Hypertension Brother    Heart attack Maternal Grandfather        died from heart attack at the age of 68   Early death Paternal Grandfather    Heart attack Paternal Grandfather    Colon cancer Neg Hx    Esophageal cancer Neg Hx    Rectal cancer Neg Hx    Stomach cancer Neg Hx     Past Surgical History:  Procedure  Laterality Date   ANKLE ARTHROSCOPY Right    COLONOSCOPY WITH PROPOFOL  N/A 09/18/2020   Procedure: COLONOSCOPY WITH PROPOFOL ;  Surgeon: Albertina Hugger, MD;  Location: [facility];  Service: Gastroenterology;  Laterality: N/A;  09-18-2020   CORONARY STENT INTERVENTION N/A 11/24/2018   Procedure: CORONARY STENT INTERVENTION;  Surgeon: Wenona Hamilton, MD;  Location: [facility];  Service: Cardiovascular;  Laterality: N/A;  prox and mid LAD  CORONARY STENT INTERVENTION N/A 11/26/2018   Procedure: CORONARY STENT INTERVENTION;  Surgeon: Wenona Hamilton, MD;  Location: [facility];  Service: Cardiovascular;  Laterality: N/A;   CYST REMOVAL HAND     KNEE ARTHROSCOPY Left    LEFT HEART CATH AND CORONARY ANGIOGRAPHY N/A 11/24/2018   Procedure: LEFT HEART CATH AND CORONARY ANGIOGRAPHY;  Surgeon: Wenona Hamilton, MD;  Location: [facility];  Service: Cardiovascular;  Laterality: N/A;   POLYPECTOMY  09/18/2020   Procedure: POLYPECTOMY;  Surgeon: Albertina Hugger, MD;  Location: [facility];  Service: Gastroenterology;;   Social History   Occupational History   Occupation: Disability  Tobacco Use   Smoking status: Never   Smokeless tobacco: Never  Vaping Use   Vaping status: Never Used  Substance and Sexual Activity   Alcohol use: Not Currently    Alcohol/week: 12.0 standard drinks of alcohol    Types: 12 Cans of beer per week   Drug use: No   Sexual activity: Yes    Comment: male 8 months partner

## 2023-09-04 NOTE — Progress Notes (Signed)
 No pre medications ordered per MD. Patient tolerated iron infusion with no complaints voiced.  Peripheral IV site clean and dry with good blood return noted before and after infusion.  Band aid applied.  Pt observed for 30 minutes post venofer infusion without any complications. VSS with discharge and left in satisfactory condition with no s/s of distress noted. All follow ups as scheduled.   Evan [name] [employer]

## 2023-09-14 DIAGNOSIS — R1084 Generalized abdominal pain: Secondary | ICD-10-CM | POA: Diagnosis not present

## 2023-09-15 ENCOUNTER — Ambulatory Visit: Admitting: Orthopedic Surgery

## 2023-09-15 DIAGNOSIS — L97909 Non-pressure chronic ulcer of unspecified part of unspecified lower leg with unspecified severity: Secondary | ICD-10-CM | POA: Diagnosis not present

## 2023-09-15 DIAGNOSIS — I87332 Chronic venous hypertension (idiopathic) with ulcer and inflammation of left lower extremity: Secondary | ICD-10-CM

## 2023-09-15 DIAGNOSIS — E11622 Type 2 diabetes mellitus with other skin ulcer: Secondary | ICD-10-CM | POA: Diagnosis not present

## 2023-09-18 ENCOUNTER — Encounter: Payer: Self-pay | Admitting: Orthopedic Surgery

## 2023-09-18 NOTE — Progress Notes (Signed)
 Office Visit Note   Patient: Evan [name]           Date of Birth: 08-06-1965           MRN: [id number] Visit Date: 09/15/2023              Requested by: Dettinger, Lucio Sabin, MD [address] [place],  [place] [address] PCP: Dettinger, Lucio Sabin, MD  Chief Complaint  Patient presents with   Left Leg - Wound Check   Right Leg - Wound Check      HPI: Patient is a 58 year old gentleman who is seen in follow-up for venous insufficiency ulcer left medial ankle.  Patient had Kerecis applie, on February 3, 3 months ago.  Assessment & Plan: Visit Diagnoses:  1. Diabetic ulcer of lower leg (HCC)   2. Chronic venous hypertension (idiopathic) with ulcer and inflammation of left lower extremity (HCC)     Plan: Continue with the compression sock and wound care.  Follow-Up Instructions: Return in about 2 weeks (around 09/29/2023).   Ortho Exam  Patient is alert, oriented, no adenopathy, well-dressed, normal affect, normal respiratory effort. Examination the study ulcer of the left medial malleolus now measures 2 x 1.5 cm.  Patient also has multiple satellite ulcers that have healed.  There is an ulcer on the right calf that measures 2 x 4 cm.  Imaging: No results found.      Labs: Lab Results  Component Value Date   HGBA1C 7.8 (H) 08/17/2023   HGBA1C 7.3 (H) 04/19/2023   HGBA1C 7.4 (H) 02/11/2023   ESRSEDRATE 3 05/05/2023   ESRSEDRATE 1 05/05/2023   CRP 2.0 (H) 05/05/2023   CRP 2.5 (H) 05/05/2023   LABURIC 7.1 10/12/2019   LABURIC 6.1 11/24/2018   LABURIC 5.9 02/04/2017   REPTSTATUS 05/10/2023 FINAL 05/05/2023   CULT  05/05/2023    NO GROWTH 5 DAYS Performed at [facility], [address] [place], [place] [address]      Lab Results  Component Value Date   ALBUMIN 3.9 07/29/2023   ALBUMIN 2.9 (L) 06/10/2023   ALBUMIN 3.1 (L) 05/28/2023   PREALBUMIN 12 (L) 05/05/2023    Lab Results  Component Value Date   MG 2.2 06/02/2023   MG 2.0 06/01/2023   MG  2.2 05/31/2023   No results found for: "VD25OH"  Lab Results  Component Value Date   PREALBUMIN 12 (L) 05/05/2023      Latest Ref Rng & Units 07/29/2023    3:19 PM 06/01/2023    7:17 AM 05/31/2023    4:44 AM  CBC EXTENDED  WBC 3.4 - 10.8 x10E3/uL 8.6  10.0  10.3   RBC 4.14 - 5.80 x10E6/uL 6.21  5.62  5.62   Hemoglobin 13.0 - 17.7 g/dL 82.9  56.2  13.0   HCT 37.5 - 51.0 % 49.0  45.0  44.8   Platelets 150 - 450 x10E3/uL 281  254  244   NEUT# 1.4 - 7.0 x10E3/uL 6.5     Lymph# 0.7 - 3.1 x10E3/uL 1.4        There is no height or weight on file to calculate BMI.  Orders:  No orders of the defined types were placed in this encounter.  No orders of the defined types were placed in this encounter.    Procedures: No procedures performed  Clinical Data: No additional findings.  ROS:  All other systems negative, except as noted in the HPI. Review of Systems  Objective: Vital Signs: There were no vitals taken for this visit.  Specialty Comments:  No specialty comments available.  PMFS History: Patient Active Problem List   Diagnosis Date Noted   Iron deficiency 08/21/2023   Polysubstance abuse (HCC) 06/17/2023   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) 05/28/2023   Lymphedema 05/28/2023   Nonsustained ventricular tachycardia (HCC) 05/28/2023   History of coronary artery stent placement 05/28/2023   LV (left ventricular) mural thrombus 05/28/2023   Pulmonary hypertension, unspecified (HCC) 05/28/2023   OSA (obstructive sleep apnea) 05/28/2023   Cocaine use 05/28/2023   Atherosclerosis of native coronary artery of native heart without angina pectoris 05/28/2023   Biventricular heart failure (HCC) 05/28/2023   Acute decompensated heart failure (HCC) 05/28/2023   Acute HFrEF (heart failure with reduced ejection fraction) (HCC) 05/27/2023   Diabetic foot ulcer (HCC) 05/05/2023   History of acute cholecystitis  04/18/23 treated conservatively 05/05/2023   Acute on chronic HFrEF (heart failure with reduced ejection fraction) (HCC) 05/05/2023   Diabetic ulcer of lower leg (HCC) 05/05/2023   Cellulitis in diabetic foot (HCC) 05/05/2023   CHF (congestive heart failure) (HCC) 05/05/2023   Bilateral leg ulcer (HCC) 05/05/2023   Acute on chronic combined systolic and diastolic CHF (congestive heart failure) (HCC) 05/05/2023   Vasculitis (HCC) 05/05/2023   Abdominal pain 04/19/2023   Elevated brain natriuretic peptide (BNP) level 04/19/2023   Elevated troponin 04/19/2023   Elevated d-dimer 04/19/2023   Type 2 diabetes mellitus with hyperglycemia (HCC) 04/19/2023   Acute cholecystitis 04/18/2023   Hypertension 10/06/2022   Type 2 diabetes mellitus with complications (HCC) 02/15/2021   CAD S/P PCI 12/09/2018   Ischemic cardiomyopathy 12/09/2018   Left ventricular dysfunction    History of non-ST elevation myocardial infarction (NSTEMI) 11/24/2018   Obesity (BMI 30.0-34.9) 01/05/2018   GERD (gastroesophageal reflux disease) 07/08/2017   Mixed hyperlipidemia 12/22/2016   Essential hypertension 09/22/2016   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) 09/22/2016   Gout 09/22/2016   Past Medical History:  Diagnosis Date   Arthritis    Atrial fibrillation (HCC)    CHF (congestive heart failure) (HCC)    Diabetes mellitus without complication (HCC)    Gout    Heart attack (HCC) 10/2018   Hyperlipidemia    Hypertension    Morbid obesity (HCC) 12/22/2016   Sleep apnea    had test twice unable to complete    Family History  Problem Relation Age of Onset   Diabetes Mother    Stroke Mother    Early death Father    Heart attack Father        died from heart attack at the age of 12   Alcohol abuse Father    Heart disease Father    Breast cancer Sister    Hypertension Brother    Heart attack Maternal Grandfather        died from heart attack at the age of 51   Early death Paternal  Grandfather    Heart attack Paternal Grandfather    Colon cancer Neg Hx    Esophageal cancer Neg Hx    Rectal cancer Neg Hx    Stomach cancer Neg Hx     Past Surgical History:  Procedure Laterality Date   ANKLE ARTHROSCOPY Right    COLONOSCOPY WITH PROPOFOL  N/A 09/18/2020   Procedure: COLONOSCOPY WITH PROPOFOL ;  Surgeon: Albertina Hugger, MD;  Location: [facility];  Service: Gastroenterology;  Laterality:  N/A;  09-18-2020   CORONARY STENT INTERVENTION N/A 11/24/2018   Procedure: CORONARY STENT INTERVENTION;  Surgeon: Wenona Hamilton, MD;  [facility];  Service: Cardiovascular;  Laterality: N/A;  prox and mid LAD   CORONARY STENT INTERVENTION N/A 11/26/2018   Procedure: CORONARY STENT INTERVENTION;  Surgeon: Wenona Hamilton, MD;  [facility];  Service: Cardiovascular;  Laterality: N/A;   CYST REMOVAL HAND     KNEE ARTHROSCOPY Left    LEFT HEART CATH AND CORONARY ANGIOGRAPHY N/A 11/24/2018   Procedure: LEFT HEART CATH AND CORONARY ANGIOGRAPHY;  Surgeon: Wenona Hamilton, MD;  [facility]: [facility];  Service: Cardiovascular;  Laterality: N/A;   POLYPECTOMY  09/18/2020   Procedure: POLYPECTOMY;  Surgeon: Albertina Hugger, MD;  Location: [facility];  Service: Gastroenterology;;   Social History   Occupational History   Occupation: Disability  Tobacco Use   Smoking status: Never   Smokeless tobacco: Never  Vaping Use   Vaping status: Never Used  Substance and Sexual Activity   Alcohol use: Not Currently    Alcohol/week: 12.0 standard drinks of alcohol    Types: 12 Cans of beer per week   Drug use: No   Sexual activity: Yes    Comment: male 8 months partner

## 2023-09-22 ENCOUNTER — Other Ambulatory Visit: Payer: Self-pay | Admitting: Family Medicine

## 2023-09-22 DIAGNOSIS — E1169 Type 2 diabetes mellitus with other specified complication: Secondary | ICD-10-CM

## 2023-09-22 DIAGNOSIS — E11628 Type 2 diabetes mellitus with other skin complications: Secondary | ICD-10-CM

## 2023-09-25 ENCOUNTER — Inpatient Hospital Stay: Attending: Oncology

## 2023-09-25 DIAGNOSIS — E611 Iron deficiency: Secondary | ICD-10-CM | POA: Insufficient documentation

## 2023-09-25 LAB — CBC WITH DIFFERENTIAL/PLATELET
Abs Immature Granulocytes: 0.02 10*3/uL (ref 0.00–0.07)
Basophils Absolute: 0.1 10*3/uL (ref 0.0–0.1)
Basophils Relative: 1 %
Eosinophils Absolute: 0.2 10*3/uL (ref 0.0–0.5)
Eosinophils Relative: 3 %
HCT: 48.4 % (ref 39.0–52.0)
Hemoglobin: 15.2 g/dL (ref 13.0–17.0)
Immature Granulocytes: 0 %
Lymphocytes Relative: 21 %
Lymphs Abs: 1.5 10*3/uL (ref 0.7–4.0)
MCH: 27 pg (ref 26.0–34.0)
MCHC: 31.4 g/dL (ref 30.0–36.0)
MCV: 85.8 fL (ref 80.0–100.0)
Monocytes Absolute: 0.6 10*3/uL (ref 0.1–1.0)
Monocytes Relative: 8 %
Neutro Abs: 4.9 10*3/uL (ref 1.7–7.7)
Neutrophils Relative %: 67 %
Platelets: 193 10*3/uL (ref 150–400)
RBC: 5.64 MIL/uL (ref 4.22–5.81)
RDW: 27.6 % — ABNORMAL HIGH (ref 11.5–15.5)
WBC: 7.3 10*3/uL (ref 4.0–10.5)
nRBC: 0 % (ref 0.0–0.2)

## 2023-09-25 LAB — FERRITIN: Ferritin: 181 ng/mL (ref 24–336)

## 2023-09-25 LAB — COMPREHENSIVE METABOLIC PANEL WITH GFR
ALT: 22 U/L (ref 0–44)
AST: 28 U/L (ref 15–41)
Albumin: 3.7 g/dL (ref 3.5–5.0)
Alkaline Phosphatase: 82 U/L (ref 38–126)
Anion gap: 11 (ref 5–15)
BUN: 17 mg/dL (ref 6–20)
CO2: 28 mmol/L (ref 22–32)
Calcium: 9 mg/dL (ref 8.9–10.3)
Chloride: 98 mmol/L (ref 98–111)
Creatinine, Ser: 1.21 mg/dL (ref 0.61–1.24)
GFR, Estimated: >60 mL/min (ref >60)
Glucose, Bld: 152 mg/dL — ABNORMAL HIGH (ref 70–99)
Potassium: 3.3 mmol/L — ABNORMAL LOW (ref 3.5–5.1)
Sodium: 137 mmol/L (ref 135–145)
Total Bilirubin: 1.3 mg/dL — ABNORMAL HIGH (ref 0.0–1.2)
Total Protein: 7.2 g/dL (ref 6.5–8.1)

## 2023-09-25 LAB — IRON AND TIBC
Iron: 50 ug/dL (ref 45–182)
Saturation Ratios: 11 % — ABNORMAL LOW (ref 17.9–39.5)
TIBC: 469 ug/dL — ABNORMAL HIGH (ref 250–450)
UIBC: 419 ug/dL

## 2023-09-25 LAB — FOLATE: Folate: 9.2 ng/mL (ref >5.9)

## 2023-09-25 LAB — VITAMIN B12: Vitamin B-12: 528 pg/mL (ref 180–914)

## 2023-09-25 NOTE — Progress Notes (Incomplete)
 ADVANCED HEART FAILURE CLINIC NOTE   Primary Care: Dettinger, Lucio Sabin, MD HF Cardiologist: Dr. Bruce Caper  HPI: Evan [name] is a 58 y.o. 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  Admitted 12/24 with acute on chronic CHF. He was diuresed. UDS + for opiates, cocaine and amphetamines. 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 He was transferred to [facility] for inpatient dermatology evaluation. Later diagnosed with prurigo nodules.  He was admitted in 01/25 with acute on chronic CHF with low-output. Required inotrope support with milrinone . Aggressively diuresed and GDMT titrated. UDS + for cocaine.     Current Outpatient Medications  Medication Sig Dispense Refill   acetaminophen  (TYLENOL ) 325 MG tablet Take 2 tablets (650 mg total) by mouth every 6 (six) hours as needed for mild pain (pain score 1-3) or fever (or Fever >/= 101).     albuterol  (VENTOLIN  HFA) 108 (90 Base) MCG/ACT inhaler Inhale 2 puffs into the lungs as needed for wheezing or shortness of breath. 18 g 2   apixaban  (ELIQUIS ) 5 MG TABS tablet Take 1 tablet (5 mg total) by mouth 2 (two) times daily. 60 tablet 2   aspirin  EC 81 MG tablet Take 1 tablet (81 mg total) by mouth daily with breakfast. Swallow whole. 30 tablet 12   atorvastatin  (LIPITOR ) 80 MG tablet Take 1 tablet (80 mg total) by mouth daily. 90 tablet 3   BD PEN NEEDLE NANO 2ND GEN 32G X 4 MM MISC Use daily with insulin  Dx E11.9, Z79.4 100 each 3   Blood Glucose Monitoring Suppl (ACCU-CHEK AVIVA PLUS) w/Device KIT 1 each by Does not apply route 4 (four) times daily. 1 kit 1   Blood Glucose Monitoring Suppl (ACCU-CHEK AVIVA) device Test BS  BID Dx E11.69 1 each 0   carvedilol  (COREG ) 3.125 MG tablet Take 1 tablet (3.125 mg total) by mouth 2 (two) times daily. 60 tablet 11   digoxin  (LANOXIN ) 0.125 MG tablet Take 1 tablet (0.125 mg total) by mouth daily. 30 tablet 2   empagliflozin  (JARDIANCE ) 25 MG TABS tablet Take 1 tablet (25 mg total) by mouth daily. 90 tablet 3   famotidine  (PEPCID ) 20 MG tablet Take 1 tablet (20 mg total) by mouth 2 (two) times daily. 180 tablet 3   fenofibrate  (TRICOR ) 145 MG tablet TAKE 1 TABLET BY MOUTH EVERY DAY 90 tablet 3   ferrous sulfate  325 (65 FE) MG EC tablet Take 1 tablet (325 mg total) by mouth every other day. 45 tablet 3   fluticasone  (FLONASE ) 50 MCG/ACT nasal spray Place 1 spray into both nostrils 2 (two) times daily as needed for allergies or rhinitis. 16 g 6   glucose blood (ACCU-CHEK GUIDE) test strip Check BS in the morning and at bedtime Dx E11.8 200 strip 3   hydrOXYzine  (VISTARIL ) 25 MG capsule Take 1 capsule (25 mg total) by mouth every 8 (eight) hours as needed. 30 capsule 2   insulin  glargine (LANTUS  SOLOSTAR) 100 UNIT/ML Solostar Pen Inject 10 Units into the skin daily. 9 mL 3   pantoprazole  (PROTONIX ) 40 MG tablet Take 40 mg by mouth daily.     potassium chloride  SA (KLOR-CON  M) 20 MEQ tablet Take 2 tablets (40 mEq total) by mouth daily. 60 tablet 3  spironolactone  (ALDACTONE ) 25 MG tablet Take 0.5 tablets (12.5 mg total) by mouth daily. 45 tablet 3   torsemide  (DEMADEX ) 20 MG tablet Take 2 tablets (40 mg total) by mouth 2 (two) times daily. 120 tablet 3   No current facility-administered medications for this visit.    PHYSICAL EXAM: There were no vitals filed for this visit.  GENERAL: NAD Lungs- CTA CARDIAC:  JVP: 7 cm          Normal rate with regular rhythm. No murmur.  Pulses 2+. No edema.  ABDOMEN: Soft, non-tender, non-distended.  EXTREMITIES: Warm and well perfused.  NEUROLOGIC: No obvious FND   DATA REVIEW  ECG: 05/28/23: NSR with old anterior infarct  As per my  personal interpretation  ECHO: 05/05/23: LVEF 20%, moderately reduced RV function as per my personal interpretation 4/22: LVEF 35%-40%, normal RV function  CATH: 11/24/18:  1.  Severe three-vessel coronary artery disease.  Occluded ostial and mid LAD likely due to late presenting anterior STEMI.  Significant distal left circumflex disease but the supplied territory is small.  Significant mid RCA stenosis with occluded posterior AV groove with left-to-right collaterals. 2.  Severely reduced LV systolic function with an EF of 20 to 25% with anterior wall akinesis.  LVEDP was 32 mmHg. 3.  Successful PCI and drug-eluting stent placement to the ostial as well as mid LAD.   ASSESSMENT & PLAN:  Heart failure with reduced EF Etiology of ZO:XWRUE ischemic & nonsichemic cardiomyopathy; likely exacerbated by polysubstance abuse.  NYHA class / AHA Stage:NYHA II-III Volume status & Diuretics: continue torsemide  40mg  BID Vasodilators: weaned off midodrine  now; will start at follow up.  Beta-Blocker:holding due to low output heart failure; dig , repeat digoxin  level today; start coreg  3.125mg  BID. MRA:spironolactone  25mg  daily Cardiometabolic:jardiance  25mg  daily Devices therapies & Valvulopathies:not currently indicated Advanced therapies:not a candidate currently.   2. CAD - s/p PCI in 11/24/18 to the LAD & RCA with anterior wall hypokinesis.  - continue asa 81mg  daily & lipitor  80mg  daily  - no chest pain  3. LV thrombus - continue apixaban  5mg  BID  4. Polysubstance abuse - discussed importance of drug cessation; so far he reports doing well with this.   5. Chronic venous insufficency with LE ulcers - Followed by [name]; last seen on 06/09/23 for massive venous stasis ulcers on both legs.  - Vashe dressings and compression wraps applied with plan for follow up later this week.   I spent *** minutes caring for this patient today including face to face time, ordering and reviewing labs,  discussing importance of medication compliance extensively, reviewing echo with him again, seeing the patient, documenting in the record, and arranging follow ups.   Follow-up: ***  Gerilyn Kobus, PA-C

## 2023-09-28 ENCOUNTER — Telehealth (HOSPITAL_COMMUNITY): Payer: Self-pay | Admitting: Cardiology

## 2023-09-28 NOTE — Telephone Encounter (Signed)
 Called to confirm/remind patient of their appointment at the [facility] on 09/28/23 .   Appointment:   [x] Confirmed  [] Left mess   [] No answer/No voice mail  [] VM Full/unable to leave message  [] Phone not in service  Patient reminded to bring all medications and/or complete list.  Confirmed patient has transportation. Gave directions, instructed to utilize valet parking.

## 2023-09-29 ENCOUNTER — Encounter (HOSPITAL_COMMUNITY): Admitting: Cardiology

## 2023-09-29 ENCOUNTER — Ambulatory Visit (HOSPITAL_BASED_OUTPATIENT_CLINIC_OR_DEPARTMENT_OTHER)
Admission: RE | Admit: 2023-09-29 | Discharge: 2023-09-29 | Disposition: A | Discharge status: Home / Self Care | Source: Ambulatory Visit | Attending: Cardiology | Admitting: Cardiology

## 2023-09-29 ENCOUNTER — Ambulatory Visit: Admitting: Orthopedic Surgery

## 2023-09-29 ENCOUNTER — Encounter: Payer: Self-pay | Admitting: Orthopedic Surgery

## 2023-09-29 ENCOUNTER — Encounter (HOSPITAL_COMMUNITY): Payer: Self-pay | Admitting: Cardiology

## 2023-09-29 ENCOUNTER — Ambulatory Visit (HOSPITAL_COMMUNITY)
Admission: RE | Admit: 2023-09-29 | Discharge: 2023-09-29 | Disposition: A | Discharge status: Home / Self Care | Source: Ambulatory Visit | Attending: Family Medicine | Admitting: Family Medicine

## 2023-09-29 VITALS — BP 102/76 | HR 90 | Wt 231.6 lb

## 2023-09-29 DIAGNOSIS — I2510 Atherosclerotic heart disease of native coronary artery without angina pectoris: Secondary | ICD-10-CM | POA: Insufficient documentation

## 2023-09-29 DIAGNOSIS — Z7901 Long term (current) use of anticoagulants: Secondary | ICD-10-CM | POA: Diagnosis not present

## 2023-09-29 DIAGNOSIS — F1910 Other psychoactive substance abuse, uncomplicated: Secondary | ICD-10-CM

## 2023-09-29 DIAGNOSIS — Z7984 Long term (current) use of oral hypoglycemic drugs: Secondary | ICD-10-CM | POA: Diagnosis not present

## 2023-09-29 DIAGNOSIS — Z79899 Other long term (current) drug therapy: Secondary | ICD-10-CM | POA: Insufficient documentation

## 2023-09-29 DIAGNOSIS — I255 Ischemic cardiomyopathy: Secondary | ICD-10-CM | POA: Insufficient documentation

## 2023-09-29 DIAGNOSIS — I480 Paroxysmal atrial fibrillation: Secondary | ICD-10-CM | POA: Insufficient documentation

## 2023-09-29 DIAGNOSIS — I272 Pulmonary hypertension, unspecified: Secondary | ICD-10-CM | POA: Insufficient documentation

## 2023-09-29 DIAGNOSIS — I252 Old myocardial infarction: Secondary | ICD-10-CM | POA: Insufficient documentation

## 2023-09-29 DIAGNOSIS — L97912 Non-pressure chronic ulcer of unspecified part of right lower leg with fat layer exposed: Secondary | ICD-10-CM

## 2023-09-29 DIAGNOSIS — I5022 Chronic systolic (congestive) heart failure: Secondary | ICD-10-CM | POA: Insufficient documentation

## 2023-09-29 DIAGNOSIS — I083 Combined rheumatic disorders of mitral, aortic and tricuspid valves: Secondary | ICD-10-CM | POA: Insufficient documentation

## 2023-09-29 DIAGNOSIS — E785 Hyperlipidemia, unspecified: Secondary | ICD-10-CM | POA: Diagnosis not present

## 2023-09-29 DIAGNOSIS — I110 Hypertensive heart disease with heart failure: Secondary | ICD-10-CM | POA: Insufficient documentation

## 2023-09-29 DIAGNOSIS — Z955 Presence of coronary angioplasty implant and graft: Secondary | ICD-10-CM | POA: Diagnosis not present

## 2023-09-29 DIAGNOSIS — Z794 Long term (current) use of insulin: Secondary | ICD-10-CM | POA: Insufficient documentation

## 2023-09-29 DIAGNOSIS — I513 Intracardiac thrombosis, not elsewhere classified: Secondary | ICD-10-CM

## 2023-09-29 DIAGNOSIS — L97922 Non-pressure chronic ulcer of unspecified part of left lower leg with fat layer exposed: Secondary | ICD-10-CM | POA: Diagnosis not present

## 2023-09-29 DIAGNOSIS — G4733 Obstructive sleep apnea (adult) (pediatric): Secondary | ICD-10-CM | POA: Diagnosis not present

## 2023-09-29 DIAGNOSIS — E119 Type 2 diabetes mellitus without complications: Secondary | ICD-10-CM | POA: Insufficient documentation

## 2023-09-29 DIAGNOSIS — I5020 Unspecified systolic (congestive) heart failure: Secondary | ICD-10-CM | POA: Diagnosis not present

## 2023-09-29 DIAGNOSIS — Z86718 Personal history of other venous thrombosis and embolism: Secondary | ICD-10-CM | POA: Diagnosis not present

## 2023-09-29 DIAGNOSIS — I429 Cardiomyopathy, unspecified: Secondary | ICD-10-CM | POA: Insufficient documentation

## 2023-09-29 DIAGNOSIS — Z7982 Long term (current) use of aspirin: Secondary | ICD-10-CM | POA: Diagnosis not present

## 2023-09-29 DIAGNOSIS — I87332 Chronic venous hypertension (idiopathic) with ulcer and inflammation of left lower extremity: Secondary | ICD-10-CM

## 2023-09-29 DIAGNOSIS — I340 Nonrheumatic mitral (valve) insufficiency: Secondary | ICD-10-CM

## 2023-09-29 LAB — ECHOCARDIOGRAM COMPLETE
Est EF: <20
MV M vel: 4.41 m/s
MV Peak grad: 77.8 mmHg
Radius: 0.6 cm
S' Lateral: 5.5 cm
Single Plane A4C EF: 15.8 %

## 2023-09-29 MED ORDER — POTASSIUM CHLORIDE CRYS ER 20 MEQ PO TBCR: 60.0000 meq | EXTENDED_RELEASE_TABLET | Freq: Every day | ORAL | 3 refills | Status: DC

## 2023-09-29 MED ORDER — LOSARTAN POTASSIUM 25 MG PO TABS: 12.5000 mg | ORAL_TABLET | Freq: Every day | ORAL | 3 refills | Status: DC

## 2023-09-29 NOTE — Patient Instructions (Addendum)
 Medication Changes:  START: LOSARTAN  12.5MG  NIGHTLY   START: POTASSIUM ONCE DAILY   Lab Work:  PLEASE RETURN FOR LABS AS SCHEDULED IN 1 WEEK- DO NOT TAKE YOUR DIGOXIN  THE MORNING OF THESE LABS  Follow-Up in: [facility] IN 3 WEEKS AS SCHEDULED   THEN AGAIN IN 2 MONTHS AS SCHEDULED  At the [facility], you and your health needs are our priority. We have a designated team specialized in the treatment of Heart Failure. This Care Team includes your primary Heart Failure Specialized Cardiologist (physician), Advanced Practice Providers (APPs- Physician Assistants and Nurse Practitioners), and Pharmacist who all work together to provide you with the care you need, when you need it.   You may see any of the following providers on your designated Care Team at your next follow up:  Dr. Jules Oar Dr. Peder Bourdon Dr. Alwin Baars Dr. Judyth Nunnery Nieves Bars, NP Ruddy Corral, [place] [facility] [place], [place] Dennise Fitz, NP [place] Lee, NP Luster Salters, PharmD   Please be sure to bring in all your medications bottles to every appointment.   Need to Contact Us :  If you have any questions or concerns before your next appointment please send us  a message through [place] or call our office at [phone].    TO LEAVE A MESSAGE FOR THE NURSE SELECT OPTION 2, PLEASE LEAVE A MESSAGE INCLUDING: YOUR NAME DATE OF BIRTH CALL BACK NUMBER REASON FOR CALL**this is important as we prioritize the call backs  YOU WILL RECEIVE A CALL BACK THE SAME DAY AS LONG AS YOU CALL BEFORE 4:00 PM

## 2023-09-29 NOTE — Progress Notes (Signed)
 Office Visit Note   Patient: Evan [name]           Date of Birth: [birth year]           MRN: [id number] Visit Date: 09/29/2023              Requested by: Dettinger, Lucio Sabin, MD [address] [place],  [place] [address] PCP: Dettinger, Lucio Sabin, MD  Chief Complaint  Patient presents with   Left Leg - Follow-up   Right Leg - Follow-up      HPI: Patient is a 58 year old gentleman who presents in follow-up for ulcerations left and right lower extremity.  Assessment & Plan: Visit Diagnoses:  1. Chronic venous hypertension (idiopathic) with ulcer and inflammation of left lower extremity (HCC)   2. Ulcers of both lower extremities with fat layer exposed (HCC)     Plan: Patient will continue with his medical compression socks.  Follow-Up Instructions: Return in about 4 weeks (around 10/27/2023).   Ortho Exam  Patient is alert, oriented, no adenopathy, well-dressed, normal affect, normal respiratory effort. Examination the left medial malleolar ulcer measures 1.5 x 1.5 cm with healthy granulation tissue.  The right medial leg ulcer measures 1 x 1.5 cm with healthy granulation tissue.  Patient is showing excellent improvement.  Currently wearing the Vive socks.  Imaging: No results found.     Labs: Lab Results  Component Value Date   HGBA1C 7.8 (H) 08/17/2023   HGBA1C 7.3 (H) 04/19/2023   HGBA1C 7.4 (H) 02/11/2023   ESRSEDRATE 3 05/05/2023   ESRSEDRATE 1 05/05/2023   CRP 2.0 (H) 05/05/2023   CRP 2.5 (H) 05/05/2023   LABURIC 7.1 10/12/2019   LABURIC 6.1 11/24/2018   LABURIC 5.9 02/04/2017   REPTSTATUS 05/10/2023 FINAL 05/05/2023   CULT  05/05/2023    NO GROWTH 5 DAYS Performed at [facility], [address] [place], [place] [address]      Lab Results  Component Value Date   ALBUMIN 3.7 09/25/2023   ALBUMIN 3.9 07/29/2023   ALBUMIN 2.9 (L) 06/10/2023   PREALBUMIN 12 (L) 05/05/2023    Lab Results  Component Value Date   MG 2.2 06/02/2023   MG 2.0  06/01/2023   MG 2.2 05/31/2023   No results found for: "VD25OH"  Lab Results  Component Value Date   PREALBUMIN 12 (L) 05/05/2023      Latest Ref Rng & Units 09/25/2023    9:21 AM 07/29/2023    3:19 PM 06/01/2023    7:17 AM  CBC EXTENDED  WBC 4.0 - 10.5 K/uL 7.3  8.6  10.0   RBC 4.22 - 5.81 MIL/uL 5.64  6.21  5.62   Hemoglobin 13.0 - 17.0 g/dL 86.5  78.4  69.6   HCT 39.0 - 52.0 % 48.4  49.0  45.0   Platelets 150 - 400 K/uL 193  281  254   NEUT# 1.7 - 7.7 K/uL 4.9  6.5    Lymph# 0.7 - 4.0 K/uL 1.5  1.4       There is no height or weight on file to calculate BMI.  Orders:  No orders of the defined types were placed in this encounter.  No orders of the defined types were placed in this encounter.    Procedures: No procedures performed  Clinical Data: No additional findings.  ROS:  All other systems negative, except as noted in the HPI. Review of Systems  Objective: Vital Signs: There were no vitals taken  for this visit.  Specialty Comments:  No specialty comments available.  PMFS History: Patient Active Problem List   Diagnosis Date Noted   Iron deficiency 08/21/2023   Polysubstance abuse (HCC) 06/17/2023   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) 05/28/2023   Lymphedema 05/28/2023   Nonsustained ventricular tachycardia (HCC) 05/28/2023   History of coronary artery stent placement 05/28/2023   LV (left ventricular) mural thrombus 05/28/2023   Pulmonary hypertension, unspecified (HCC) 05/28/2023   OSA (obstructive sleep apnea) 05/28/2023   Cocaine use 05/28/2023   Atherosclerosis of native coronary artery of native heart without angina pectoris 05/28/2023   Biventricular heart failure (HCC) 05/28/2023   Acute decompensated heart failure (HCC) 05/28/2023   Acute HFrEF (heart failure with reduced ejection fraction) (HCC) 05/27/2023   Diabetic foot ulcer (HCC) 05/05/2023   History of acute  cholecystitis 04/18/23 treated conservatively 05/05/2023   Acute on chronic HFrEF (heart failure with reduced ejection fraction) (HCC) 05/05/2023   Diabetic ulcer of lower leg (HCC) 05/05/2023   Cellulitis in diabetic foot (HCC) 05/05/2023   CHF (congestive heart failure) (HCC) 05/05/2023   Bilateral leg ulcer (HCC) 05/05/2023   Acute on chronic combined systolic and diastolic CHF (congestive heart failure) (HCC) 05/05/2023   Vasculitis (HCC) 05/05/2023   Abdominal pain 04/19/2023   Elevated brain natriuretic peptide (BNP) level 04/19/2023   Elevated troponin 04/19/2023   Elevated d-dimer 04/19/2023   Type 2 diabetes mellitus with hyperglycemia (HCC) 04/19/2023   Acute cholecystitis 04/18/2023   Hypertension 10/06/2022   Type 2 diabetes mellitus with complications (HCC) 02/15/2021   CAD S/P PCI 12/09/2018   Ischemic cardiomyopathy 12/09/2018   Left ventricular dysfunction    History of non-ST elevation myocardial infarction (NSTEMI) 11/24/2018   Obesity (BMI 30.0-34.9) 01/05/2018   GERD (gastroesophageal reflux disease) 07/08/2017   Mixed hyperlipidemia 12/22/2016   Essential hypertension 09/22/2016   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) 09/22/2016   Gout 09/22/2016   Past Medical History:  Diagnosis Date   Arthritis    Atrial fibrillation (HCC)    CHF (congestive heart failure) (HCC)    Diabetes mellitus without complication (HCC)    Gout    Heart attack (HCC) 10/2018   Hyperlipidemia    Hypertension    Morbid obesity (HCC) 12/22/2016   Sleep apnea    had test twice unable to complete    Family History  Problem Relation Age of Onset   Diabetes Mother    Stroke Mother    Early death Father    Heart attack Father        died from heart attack at the age of 4   Alcohol abuse Father    Heart disease Father    Breast cancer Sister    Hypertension Brother    Heart attack Maternal Grandfather        died from heart attack at the age of 44   Early death  Paternal Grandfather    Heart attack Paternal Grandfather    Colon cancer Neg Hx    Esophageal cancer Neg Hx    Rectal cancer Neg Hx    Stomach cancer Neg Hx     Past Surgical History:  Procedure Laterality Date   ANKLE ARTHROSCOPY Right    COLONOSCOPY WITH PROPOFOL  N/A 09/18/2020   Procedure: COLONOSCOPY WITH PROPOFOL ;  Surgeon: Albertina Hugger, MD;  Location: [facility];  Service: Gastroenterology;  Laterality: N/A;  09-18-2020   CORONARY STENT INTERVENTION  N/A 11/24/2018   Procedure: CORONARY STENT INTERVENTION;  Surgeon: Wenona Hamilton, MD;  Location: [facility];  Service: Cardiovascular;  Laterality: N/A;  prox and mid LAD   CORONARY STENT INTERVENTION N/A 11/26/2018   Procedure: CORONARY STENT INTERVENTION;  Surgeon: Wenona Hamilton, MD;  Location: [facility] CV [facility];  Service: Cardiovascular;  Laterality: N/A;   CYST REMOVAL HAND     KNEE ARTHROSCOPY Left    LEFT HEART CATH AND CORONARY ANGIOGRAPHY N/A 11/24/2018   Procedure: LEFT HEART CATH AND CORONARY ANGIOGRAPHY;  Surgeon: Wenona Hamilton, MD;  Location: [facility];  Service: Cardiovascular;  Laterality: N/A;   POLYPECTOMY  09/18/2020   Procedure: POLYPECTOMY;  Surgeon: Albertina Hugger, MD;  Location: [facility];  Service: Gastroenterology;;   Social History   Occupational History   Occupation: Disability  Tobacco Use   Smoking status: Never   Smokeless tobacco: Never  Vaping Use   Vaping status: Never Used  Substance and Sexual Activity   Alcohol use: Not Currently    Alcohol/week: 12.0 standard drinks of alcohol    Types: 12 Cans of beer per week   Drug use: No   Sexual activity: Yes    Comment: male 8 months partner

## 2023-09-29 NOTE — Progress Notes (Signed)
  Echocardiogram 2D Echocardiogram has been performed.  Evan [name] 09/29/2023, 10:00 AM

## 2023-10-02 ENCOUNTER — Inpatient Hospital Stay: Admitting: Oncology

## 2023-10-06 ENCOUNTER — Other Ambulatory Visit (HOSPITAL_COMMUNITY)

## 2023-10-08 ENCOUNTER — Inpatient Hospital Stay: Admitting: Oncology

## 2023-10-10 ENCOUNTER — Other Ambulatory Visit: Payer: Self-pay | Admitting: Cardiology

## 2023-10-19 ENCOUNTER — Other Ambulatory Visit: Payer: Self-pay | Admitting: Cardiology

## 2023-10-19 NOTE — Progress Notes (Incomplete)
 ***In Progress***    Advanced [facility] Failure Clinic Note   Primary Care: Dettinger, Lucio Sabin, MD HF Cardiologist: Dr. Bruce Caper  HPI:  Evan [name] is a 58 y.o. 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   Admitted 04/2023 with acute on chronic CHF. He was diuresed. UDS + for opiates, cocaine and amphetamines. 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 He was transferred to [facility] for inpatient dermatology evaluation. Later diagnosed with prurigo nodules.   He was admitted in 05/2023 with acute on chronic CHF with low-output. Required inotrope support with milrinone . Aggressively diuresed and GDMT titrated. UDS + for cocaine.    Recently presented to AHF Clinic for CHF follow-up 09/29/23. Was feeling well. No dyspnea, orthopnea or PND. Had intermittent lower extremity edema, R > L, seemed to be getting better with wearing compression stockings. His lower extremity wounds were healing, followed by wound clinic. Reported that he occasionally gets lightheaded, not clearly brought on by position changes. Lightheadedness sometimes correlates with low blood sugars.  Echo 09/29/23 LVEF <20% with Grade III diastolic dysfunction.. RV systolic function severely reduced. ***  Today he returns to HF clinic for pharmacist medication titration. At last visit with [facility], losartan  12.5 mg nightly was initiated. Additionally, KCL supplements were increased to 60 mEq daily.  Today he returns to HF clinic for pharmacist medication titration. At last visit with MD ***.   Overall feeling ***. Dizziness, lightheadedness, fatigue:  Chest pain or palpitations:  How is your  breathing?: *** SOB: Able to complete all ADLs. Activity level ***  Weight at home pounds. Takes furosemide /torsemide /bumex *** mg *** daily.  LEE PND/Orthopnea  Appetite *** Low-salt diet:   Physical Exam [name]  -BMET, BNP, Dig lvl? -volume assessment - reds if needed -Start vs inc spiro to 25 + r/s dig if not on -AS 7/7> labs in 1 week vs pharm clinic if needed vs see APP if volume up   HF Medications: Carvedilol  3.125 mg BID Losartan  12.5 mg nightly  Spironolactone  12.5 mg daily  Jardiance  25 mg daily Digoxin  0.125 mg daily Torsemide  40 mg BID + KCL 60 mEq daily   Has the patient been experiencing any side effects to the medications prescribed?  {YES NO:22349}  Does the patient have any problems obtaining medications due to transportation or finances?   No***; [name]  Understanding of regimen: {excellent/good/fair/poor:19665} Understanding of indications: {excellent/good/fair/poor:19665} Potential of compliance: {excellent/good/fair/poor:19665} Patient understands to avoid NSAIDs. Patient understands to avoid decongestants.    Pertinent Lab Values: Serum creatinine ***, BUN ***, Potassium ***, Sodium ***, BNP ***, Magnesium  ***, Digoxin  ***   Vital Signs: Weight: *** (last clinic weight: 231.6 lbs) Blood pressure: ***  Heart rate: ***   Assessment/Plan: Heart failure with reduced EF Etiology of ZO:XWRUE ischemic & nonischemic cardiomyopathy; likely exacerbated by polysubstance abuse.  NYHA class / AHA Stage:NYHA II Volume status & Diuretics: Volume status ***. Continue torsemide  40 mg BID and KCL 60 mEq daily. Vasodilators: Continue losartan  12.5 mg nightly. Will try to space out medications and titrate slowly d/t history of hypotension and intermittent lightheadedness. Suspect some lightheadedness may be d/t low blood sugars. Eventually would like to get him on Entresto.*** Beta-Blocker: Continue carvedilol  3.125 mg BID, continue  digoxin  0.125 mg daily.  MRA: Continue spironolactone  12.5 mg daily Cardiometabolic:Continue Jardiance  25 mg daily Devices therapies & Valvulopathies:not currently indicated Advanced therapies:not a candidate currently, worry may need advanced therapies at some point in the future but compliance and substance use would be an issue.    2. CAD - s/p PCI to the LAD & RCA in 11/2018 - continue aspirin  81 mg daily & atorvastatin  80 mg daily . Last LDL was 56 in 07/2023 - no chest pain   3. MR - Moderate in severity on echo 04/2023 - Suspect functional - 09/29/23 echo***   4. LV thrombus - continue apixaban  5 mg BID   5. Polysubstance abuse - discussed importance of drug cessation; denies any recent use   6. Chronic venous insufficency with LE ulcers - Followed by [name]; last seen 09/29/23. Improving slowly. Wearing compression stockings.     Follow up ***   Luster Salters, PharmD, BCPS, BCCP, CPP Heart Failure Clinic Pharmacist [phone]

## 2023-10-19 NOTE — Progress Notes (Incomplete)
 ***In Progress***    Advanced Heart Failure Clinic Note   Primary Care: Dettinger, Lucio Sabin, MD HF Cardiologist: Dr. Bruce Caper  HPI:  Evan [name] is a 58 y.o. 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   Admitted 04/2023 with acute on chronic CHF. He was diuresed. UDS + for opiates, cocaine and amphetamines. 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 He was transferred to [facility] for inpatient dermatology evaluation. Later diagnosed with prurigo nodules.   He was admitted in 05/2023 with acute on chronic CHF with low-output. Required inotrope support with milrinone . Aggressively diuresed and GDMT titrated. UDS + for cocaine.    Recently presented to A[facility] Clinic for CHF follow-up 09/29/23. Was feeling well. No dyspnea, orthopnea or PND. Had intermittent lower extremity edema, R > L, seemed to be getting better with wearing compression stockings. His lower extremity wounds were healing, followed by wound clinic. Reported that he occasionally gets lightheaded, not clearly brought on by position changes. Lightheadedness sometimes correlates with low blood sugars.  Echo 09/29/23 LVEF <20% with Grade III diastolic dysfunction. RV systolic function severely reduced. Moderately elevated pulmonary artery systolic pressure. Moderate to severe mitral valve regurgitation. Moderate tricuspid valve regurgitation. ***  Today he returns to HF clinic for pharmacist medication titration. At last visit with [facility], losartan  12.5 mg nightly was initiated. Additionally, KCL supplements were increased to 60 mEq daily.   HF Medications: Carvedilol  3.125 mg BID *** LF 08/17/23 x30  *** Losartan  12.5 mg nightly *** Spironolactone  12.5 mg daily *** Jardiance  25 mg daily *** Digoxin  0.125 mg daily *** LF 08/25/23 x30 *** Torsemide  40 mg BID + KCL 60 mEq daily ***   Has the patient been experiencing any side effects to the medications prescribed?  {YES NO:22349}  Does the patient have any problems obtaining medications due to transportation or finances?   No***; BCBS [place] Medicaid  Understanding of regimen: {excellent/good/fair/poor:19665} Understanding of indications: {excellent/good/fair/poor:19665} Potential of compliance: {excellent/good/fair/poor:19665} Patient understands to avoid NSAIDs. Patient understands to avoid decongestants.    Pertinent Lab Values: (09/25/2023) Serum creatinine 1.21 mg/dL, BUN 17 mg/dL, Potassium 3.3 mmol/L, Sodium 137 mmol/L  Vital Signs: Weight: *** (last clinic weight: 231.6 lbs) Blood pressure: *** (last clinic BP 102/72 mmHg) Heart rate: *** (last clinic HR 90 bpm)  Assessment/Plan: Heart failure with reduced EF Etiology of ZH:YQMVH ischemic & nonischemic cardiomyopathy; likely exacerbated by polysubstance abuse.  NYHA class / AHA Stage:NYHA II Volume status & Diuretics: Volume status ***. Continue torsemide  40 mg BID and KCL 60 mEq daily*** Vasodilators: Continue losartan  12.5 mg nightly. Will try to space out medications and titrate slowly d/t history of hypotension and intermittent lightheadedness. Suspect some lightheadedness may be d/t low blood sugars. Eventually would like to get him on Entresto.*** Beta-Blocker: Continue carvedilol  3.125 mg BID***, continue digoxin  0.125 mg daily. *** MRA: Continue spironolactone  12.5 mg daily*** Cardiometabolic:Continue Jardiance  25 mg daily Devices therapies & Valvulopathies:not currently indicated Advanced therapies:not a candidate currently, worry may need advanced therapies at some point in the future but compliance and substance use would be an issue.    2. CAD - s/p PCI to the LAD  & RCA in 11/2018 - continue aspirin  81 mg daily & atorvastatin  80 mg daily . Last LDL was  56 in 07/2023 - no chest pain   3. MR - Moderate in severity on echo 04/2023 - Suspect functional - 09/29/23 ECHO: LVEF <20%, Grade III diastolic dysfunction, RV systolic function severely reduced, moderate to severe mitral valve regurgitation, moderate tricuspid valve regurgitation.   4. LV thrombus - continue apixaban  5 mg BID   5. Polysubstance abuse - discussed importance of drug cessation; denies any recent use   6. Chronic venous insufficency with LE ulcers - Followed by [name]; last seen 09/29/23. Improving slowly. Wearing compression stockings.    Follow up ***  Volney Grumbles, PharmD PGY-1 Acute Care Pharmacy Resident  Luster Salters, PharmD, BCPS, [facility], CPP Heart Failure Clinic Pharmacist [phone]

## 2023-10-20 ENCOUNTER — Inpatient Hospital Stay (HOSPITAL_COMMUNITY): Admission: RE | Admit: 2023-10-20 | Source: Ambulatory Visit

## 2023-10-20 NOTE — Progress Notes (Signed)
 Advanced Heart Failure Clinic Note   Primary Care: Dettinger, Lucio Sabin, MD HF Cardiologist: Dr. Bruce Caper  HPI:  Evan [name] is a 58 y.o. 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   Admitted 04/2023 with acute on chronic CHF. He was diuresed. UDS + for opiates, cocaine and amphetamines. 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 He was transferred to [facility] for inpatient dermatology evaluation. Later diagnosed with prurigo nodules.   He was admitted in 05/2023 with acute on chronic CHF with low-output. Required inotrope support with milrinone . Aggressively diuresed and GDMT titrated. UDS + for cocaine.    Recently presented to AHF Clinic for CHF follow-up 09/29/23. Was feeling well. Echo 09/29/23 with LVEF <20% with Grade III diastolic dysfunction. RV systolic function severely reduced. Moderately elevated pulmonary artery systolic pressure. Moderate to severe mitral valve regurgitation. Moderate tricuspid valve regurgitation. Had intermittent lower extremity edema, R > L, seemed to be getting better with wearing compression stockings. His lower extremity wounds were healing, followed by wound clinic. Reported that he occasionally gets lightheaded, not clearly brought on by position changes and may be related to low BG.  Today he returns to HF clinic for pharmacist medication titration. Overall, feeling more tired than normal. At last visit with [facility], losartan  12.5 mg nightly was initiated. Additionally, KCL supplements were increased to 60 mEq daily. Today, patient reports that he has not been taking the losartan  due to concerns that it would lower his BP and he  is concerned it is what led to his latest hospitalization.Evan [name] SBP 108/76 mmHg today, patient reports home morning BP of ~90/70 mmHg. Patient denies recent episodes of dizziness or lightheadedness. Patient reports SOB only when walking up an incline or flight of stairs. Patient continues to take torsemide  40 mg BID, weight up 2 lbs since last visit, patient does not check weight at home. No LEE edema noted. No episodes of PND or orthopnea reported by the patient. Patient reports that he has not taken digoxin  since Saturday and Eliquis  since Monday, reports adherence to all other medications.   HF Medications: Carvedilol  3.125 mg BID Spironolactone  12.5 mg daily  Jardiance  25 mg daily Digoxin  0.125 mg daily  Torsemide  40 mg BID + KCL 60 mEq daily  Has the patient been experiencing any side effects to the medications prescribed?  No  Does the patient have any problems obtaining medications due to transportation or finances?   No; BCBS [place] Medicaid  Understanding of regimen: good Understanding of indications: good Potential of compliance: good - ran out of digoxin  and Eliquis  earlier this week Patient understands to avoid NSAIDs. Patient understands to avoid decongestants.   Pertinent Lab Values: (09/25/2023) Serum creatinine 1.21 mg/dL, BUN 17 mg/dL, Potassium 3.3 mmol/L, Sodium 137 mmol/L   Vital Signs: Weight: 233.4 lbs (last clinic weight: 231.6 lbs) Blood pressure: 108/76 mmHg Heart rate: 89 bpm  Assessment/Plan: Heart failure with reduced EF Etiology of RK:YHCWC ischemic & nonischemic cardiomyopathy; likely exacerbated by polysubstance abuse.  NYHA class / AHA Stage:NYHA II Volume status & Diuretics: Volume status stable. Continue torsemide  40 mg BID and KCL 60 mEq daily. BMET, BNP today pending. Vasodilators: Will remove losartan  from medication list due to patient concerns about low blood pressure. He has  not been taking the losartan .  Beta-Blocker: Continue carvedilol  3.125 mg BID,  continue digoxin  0.125 mg daily. Refills sent to patient's home pharmacy. Check BMET/Digoxin  level in 1 week (10/27/23). Instructed patient not to take digoxin  on 10/27/23 AM before level is drawn.  MRA: Increase spironolactone  to 25 mg daily. Check BMET/BNP today and BMET in 1 week (10/27/23).  Cardiometabolic:Continue Jardiance  25 mg daily Devices therapies & Valvulopathies:not currently indicated Advanced therapies:not a candidate currently, worry may need advanced therapies at some point in the future but compliance and substance use would be an issue.   2. CAD - s/p PCI to the LAD & RCA in 11/2018 - continue aspirin  81 mg daily & atorvastatin  80 mg daily . Last LDL was 56 in 07/2023 - no chest pain   3. MR - Moderate in severity on echo 04/2023 - Suspect functional  09/29/23 ECHO: LVEF <20%, Grade III diastolic dysfunction, RV systolic function severely reduced, moderate to severe mitral valve regurgitation, moderate tricuspid valve regurgitation.    4. LV thrombus - continue apixaban  5 mg BID. - Patient has not taken since Monday (10/19/23), refills sent to patient's home pharmacy   5. Polysubstance abuse - discussed importance of drug cessation; denies any recent use   6. Chronic venous insufficency with LE ulcers - Followed by [name]; last seen 09/29/23. Improving slowly. Wearing compression stockings.   Follow up with Dr. Bruce Caper on 11/23/23.   Volney Grumbles, PharmD PGY-1 Acute Care Pharmacy Resident  Luster Salters, PharmD, BCPS, [facility], CPP Heart Failure Clinic Pharmacist [phone]

## 2023-10-20 NOTE — Progress Notes (Incomplete)
 ***In Progress***    Advanced Heart Failure Clinic Note   Primary Care: Dettinger, Lucio Sabin, MD HF Cardiologist: Dr. Bruce Caper  HPI:  Evan [name] is a 58 y.o. 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   Admitted 04/2023 with acute on chronic CHF. He was diuresed. UDS + for opiates, cocaine and amphetamines. 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 He was transferred to [facility] for inpatient dermatology evaluation. Later diagnosed with prurigo nodules.   He was admitted in 05/2023 with acute on chronic CHF with low-output. Required inotrope support with milrinone . Aggressively diuresed and GDMT titrated. UDS + for cocaine.    Recently presented to AHF Clinic for CHF follow-up 09/29/23. Was feeling well. No dyspnea, orthopnea or PND. Had intermittent lower extremity edema, R > L, seemed to be getting better with wearing compression stockings. His lower extremity wounds were healing, followed by wound clinic. Reported that he occasionally gets lightheaded, not clearly brought on by position changes. Lightheadedness sometimes correlates with low blood sugars.  Echo 09/29/23 LVEF <20% with Grade III diastolic dysfunction. RV systolic function severely reduced. Moderately elevated pulmonary artery systolic pressure. Moderate to severe mitral valve regurgitation. Moderate tricuspid valve regurgitation. ***   Today he returns to HF clinic for pharmacist medication titration. At last visit with [facility], losartan  12.5 mg nightly was initiated. Additionally, KCL supplements were increased to 60 mEq daily.  Today he returns to HF clinic for pharmacist medication titration.  At last visit with MD ***.   Overall feeling ***. Dizziness, lightheadedness, fatigue:  Chest pain or palpitations:  How is your breathing?: *** SOB: Able to complete all ADLs. Activity level ***  Weight at home pounds. Takes furosemide /torsemide /bumex *** mg *** daily.  LEE PND/Orthopnea  Appetite *** Low-salt diet:   Physical Exam [name]  -BMET, BNP, Dig lvl? -volume assessment - reds if needed -Start vs inc spiro to 25 + r/s dig if not on -AS 7/7> labs in 1 week vs pharm clinic if needed vs see APP if volume up   HF Medications: Carvedilol  3.125 mg BID Losartan  12.5 mg nightly  Spironolactone  12.5 mg daily  Jardiance  25 mg daily Digoxin  0.125 mg daily Torsemide  40 mg BID + KCL 60 mEq daily   Has the patient been experiencing any side effects to the medications prescribed?  {YES NO:22349}  Does the patient have any problems obtaining medications due to transportation or finances?   No***; BCBS  Medicaid  Understanding of regimen: {excellent/good/fair/poor:19665} Understanding of indications: {excellent/good/fair/poor:19665} Potential of compliance: {excellent/good/fair/poor:19665} Patient understands to avoid NSAIDs. Patient understands to avoid decongestants.    Pertinent Lab Values: (09/25/2023) Serum creatinine 1.21 mg/dL, BUN 17 mg/dL, Potassium 3.3 mmol/L, Sodium 137 mmol/L   Vital Signs: Weight: *** (last clinic weight: 231.6 lbs) Blood pressure: *** (last clinic BP 102/72 mmHg) Heart rate: *** (last clinic HR 90 bpm)  Assessment/Plan: Heart failure with reduced EF Etiology of RU:EAVWU ischemic & nonischemic cardiomyopathy; likely exacerbated by polysubstance abuse.  NYHA class / AHA Stage:NYHA II Volume status & Diuretics: Volume status ***. Continue torsemide  40 mg BID and KCL 60 mEq daily*** Vasodilators: Continue losartan  12.5 mg nightly. Will try to space out medications and titrate slowly d/t history of hypotension and  intermittent lightheadedness.  Suspect some lightheadedness may be d/t low blood sugars. Eventually would like to get him on Entresto.*** Beta-Blocker: Continue carvedilol  3.125 mg BID***, continue digoxin  0.125 mg daily. *** MRA: Continue spironolactone  12.5 mg daily*** Cardiometabolic:Continue Jardiance  25 mg daily Devices therapies & Valvulopathies:not currently indicated Advanced therapies:not a candidate currently, worry may need advanced therapies at some point in the future but compliance and substance use would be an issue.    2. CAD - s/p PCI to the LAD & RCA in 11/2018 - continue aspirin  81 mg daily & atorvastatin  80 mg daily . Last LDL was 56 in 07/2023 - no chest pain   3. MR - Moderate in severity on echo 04/2023 - Suspect functional  09/29/23 ECHO: LVEF <20%, Grade III diastolic dysfunction, RV systolic function severely reduced, moderate to severe mitral valve regurgitation, moderate tricuspid valve regurgitation.    4. LV thrombus - continue apixaban  5 mg BID   5. Polysubstance abuse - discussed importance of drug cessation; denies any recent use   6. Chronic venous insufficency with LE ulcers - Followed by [name]; last seen 09/29/23. Improving slowly. Wearing compression stockings.   Follow up ***  Volney Grumbles, PharmD PGY-1 Acute Care Pharmacy Resident 10/20/2023 12:23 PM'  Luster Salters, PharmD, BCPS, [facility], CPP Heart Failure Clinic Pharmacist [phone]

## 2023-10-21 ENCOUNTER — Other Ambulatory Visit (HOSPITAL_COMMUNITY): Payer: Self-pay

## 2023-10-21 ENCOUNTER — Ambulatory Visit (HOSPITAL_COMMUNITY)
Admission: RE | Admit: 2023-10-21 | Discharge: 2023-10-21 | Disposition: A | Discharge status: Home / Self Care | Source: Ambulatory Visit | Attending: Cardiology | Admitting: Cardiology

## 2023-10-21 ENCOUNTER — Ambulatory Visit (HOSPITAL_COMMUNITY): Payer: Self-pay | Admitting: Pharmacist

## 2023-10-21 DIAGNOSIS — G4733 Obstructive sleep apnea (adult) (pediatric): Secondary | ICD-10-CM | POA: Diagnosis not present

## 2023-10-21 DIAGNOSIS — I5022 Chronic systolic (congestive) heart failure: Secondary | ICD-10-CM | POA: Diagnosis not present

## 2023-10-21 DIAGNOSIS — Z7984 Long term (current) use of oral hypoglycemic drugs: Secondary | ICD-10-CM | POA: Diagnosis not present

## 2023-10-21 DIAGNOSIS — Z955 Presence of coronary angioplasty implant and graft: Secondary | ICD-10-CM | POA: Insufficient documentation

## 2023-10-21 DIAGNOSIS — Z7902 Long term (current) use of antithrombotics/antiplatelets: Secondary | ICD-10-CM | POA: Insufficient documentation

## 2023-10-21 DIAGNOSIS — I428 Other cardiomyopathies: Secondary | ICD-10-CM | POA: Insufficient documentation

## 2023-10-21 DIAGNOSIS — I25118 Atherosclerotic heart disease of native coronary artery with other forms of angina pectoris: Secondary | ICD-10-CM

## 2023-10-21 DIAGNOSIS — E119 Type 2 diabetes mellitus without complications: Secondary | ICD-10-CM | POA: Insufficient documentation

## 2023-10-21 DIAGNOSIS — Z79899 Other long term (current) drug therapy: Secondary | ICD-10-CM | POA: Insufficient documentation

## 2023-10-21 DIAGNOSIS — F1910 Other psychoactive substance abuse, uncomplicated: Secondary | ICD-10-CM | POA: Diagnosis not present

## 2023-10-21 DIAGNOSIS — I255 Ischemic cardiomyopathy: Secondary | ICD-10-CM | POA: Diagnosis not present

## 2023-10-21 DIAGNOSIS — I252 Old myocardial infarction: Secondary | ICD-10-CM | POA: Diagnosis not present

## 2023-10-21 DIAGNOSIS — I2510 Atherosclerotic heart disease of native coronary artery without angina pectoris: Secondary | ICD-10-CM | POA: Insufficient documentation

## 2023-10-21 DIAGNOSIS — I110 Hypertensive heart disease with heart failure: Secondary | ICD-10-CM | POA: Insufficient documentation

## 2023-10-21 DIAGNOSIS — I480 Paroxysmal atrial fibrillation: Secondary | ICD-10-CM | POA: Insufficient documentation

## 2023-10-21 DIAGNOSIS — Z7982 Long term (current) use of aspirin: Secondary | ICD-10-CM | POA: Diagnosis not present

## 2023-10-21 DIAGNOSIS — I081 Rheumatic disorders of both mitral and tricuspid valves: Secondary | ICD-10-CM | POA: Insufficient documentation

## 2023-10-21 LAB — BASIC METABOLIC PANEL WITH GFR
Anion gap: 11 (ref 5–15)
BUN: 22 mg/dL — ABNORMAL HIGH (ref 6–20)
CO2: 26 mmol/L (ref 22–32)
Calcium: 9 mg/dL (ref 8.9–10.3)
Chloride: 100 mmol/L (ref 98–111)
Creatinine, Ser: 1.32 mg/dL — ABNORMAL HIGH (ref 0.61–1.24)
GFR, Estimated: >60 mL/min (ref >60)
Glucose, Bld: 163 mg/dL — ABNORMAL HIGH (ref 70–99)
Potassium: 3.9 mmol/L (ref 3.5–5.1)
Sodium: 137 mmol/L (ref 135–145)

## 2023-10-21 LAB — BRAIN NATRIURETIC PEPTIDE: B Natriuretic Peptide: 1103.5 pg/mL — ABNORMAL HIGH (ref 0.0–100.0)

## 2023-10-21 MED ORDER — DIGOXIN 125 MCG PO TABS: 0.1250 mg | ORAL_TABLET | Freq: Every day | ORAL | 3 refills | Status: AC

## 2023-10-21 MED ORDER — CARVEDILOL 3.125 MG PO TABS: 3.1250 mg | ORAL_TABLET | Freq: Two times a day (BID) | ORAL | 3 refills | Status: AC

## 2023-10-21 MED ORDER — SPIRONOLACTONE 25 MG PO TABS
25.0000 mg | ORAL_TABLET | Freq: Every day | ORAL | 3 refills | Status: AC
Start: 2023-10-21 — End: ?

## 2023-10-21 MED ORDER — APIXABAN 5 MG PO TABS: 5.0000 mg | ORAL_TABLET | Freq: Two times a day (BID) | ORAL | 3 refills | Status: AC

## 2023-10-21 NOTE — Patient Instructions (Addendum)
 It was a pleasure seeing you today!  MEDICATIONS: -We are changing your medications today -Stop losartan  -Increase spironolactone  to 25 mg (1 tablet) daily - Pick-up Eliquis , digoxin , and carvedilol  from [facility] today -Call if you have questions about your medications.  LABS: -We will call you if your labs need attention.  NEXT APPOINTMENT: Return to clinic on 10/27/23 at 12:00 pm to check labs - do not take digoxin  prior to visit Next appointment with Dr. Bruce Caper on 11/23/23 at 12:00 pm  In general, to take care of your heart failure: -Limit your fluid intake to 2 Liters (half-gallon) per day.   -Limit your salt intake to ideally 2-3 grams (2000-3000 mg) per day. -Weigh yourself daily and record, and bring that "weight diary" to your next appointment.  (Weight gain of 2-3 pounds in 1 day typically means fluid weight.) -The medications for your heart are to help your heart and help you live longer.   -Please contact us  before stopping any of your heart medications.  Call the clinic at [phone] with questions or to reschedule future appointments.

## 2023-10-27 ENCOUNTER — Ambulatory Visit: Admitting: Orthopedic Surgery

## 2023-10-27 ENCOUNTER — Ambulatory Visit (HOSPITAL_COMMUNITY)
Admission: RE | Admit: 2023-10-27 | Discharge: 2023-10-27 | Disposition: A | Discharge status: Home / Self Care | Source: Ambulatory Visit | Attending: Cardiology | Admitting: Cardiology

## 2023-10-27 DIAGNOSIS — I5020 Unspecified systolic (congestive) heart failure: Secondary | ICD-10-CM

## 2023-10-27 DIAGNOSIS — I5022 Chronic systolic (congestive) heart failure: Secondary | ICD-10-CM | POA: Insufficient documentation

## 2023-10-27 LAB — BASIC METABOLIC PANEL WITH GFR
Anion gap: 10 (ref 5–15)
BUN: 22 mg/dL — ABNORMAL HIGH (ref 6–20)
CO2: 28 mmol/L (ref 22–32)
Calcium: 9 mg/dL (ref 8.9–10.3)
Chloride: 100 mmol/L (ref 98–111)
Creatinine, Ser: 1.41 mg/dL — ABNORMAL HIGH (ref 0.61–1.24)
GFR, Estimated: 58 mL/min — ABNORMAL LOW (ref >60)
Glucose, Bld: 122 mg/dL — ABNORMAL HIGH (ref 70–99)
Potassium: 4.4 mmol/L (ref 3.5–5.1)
Sodium: 138 mmol/L (ref 135–145)

## 2023-10-27 LAB — DIGOXIN LEVEL: Digoxin Level: 0.4 ng/mL — ABNORMAL LOW (ref 0.8–2.0)

## 2023-11-02 ENCOUNTER — Ambulatory Visit: Admitting: Orthopedic Surgery

## 2023-11-19 ENCOUNTER — Telehealth (HOSPITAL_COMMUNITY): Payer: Self-pay | Admitting: Cardiology

## 2023-11-19 NOTE — Telephone Encounter (Signed)
 Called to confirm/remind patient of their appointment at the Advanced Heart Failure Clinic on 11/19/2023.   Appointment:   [] Confirmed  [x] Left mess   [] No answer/No voice mail  [] VM Full/unable to leave message  [] Phone not in service  Patient reminded to bring all medications and/or complete list.  Confirmed patient has transportation. Gave directions, instructed to utilize valet parking.

## 2023-11-22 NOTE — Progress Notes (Incomplete)
 ADVANCED HEART FAILURE CLINIC NOTE   Primary Care: Dettinger, Fonda LABOR, MD HF Cardiologist: Dr. Gardenia  HPI: Evan [name] is a 58 y.o. 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  Admitted 12/24 with acute on chronic CHF. He was diuresed. UDS + for opiates, cocaine and amphetamines. 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 He was transferred to [facility] for inpatient dermatology evaluation. Later diagnosed with prurigo nodules.  He was admitted in 01/25 with acute on chronic CHF with low-output. Required inotrope support with milrinone . Aggressively diuresed and GDMT titrated. UDS + for cocaine.   Here today for CHF follow-up. Feeling well. No dyspnea, orthopnea or PND. Has intermittent lower extremity edema, R > L, seems to be getting better with wearing compression stockings. His lower extremity wounds are healing, followed by wound clinic. Occasionally gets lightheaded, not clearly brought on by position changes. Sometimes correlates with low blood sugars.   Denies ETOH, tobacco and cocaine use.   Current Outpatient Medications  Medication Sig Dispense Refill   acetaminophen  (TYLENOL ) 325 MG tablet Take 2 tablets (650 mg total) by mouth every 6 (six) hours as needed for mild pain (pain score 1-3) or fever (or Fever >/= 101).     albuterol  (VENTOLIN  HFA) 108 (90 Base) MCG/ACT inhaler Inhale 2 puffs into the lungs as needed for wheezing or shortness of breath. 18 g 2   apixaban  (ELIQUIS ) 5 MG TABS tablet Take 1 tablet (5 mg total) by mouth 2 (two) times daily. 180 tablet 3   aspirin  EC 81 MG tablet Take 1 tablet (81 mg total) by mouth daily with breakfast.  Swallow whole. 30 tablet 12   atorvastatin  (LIPITOR ) 80 MG tablet Take 1 tablet (80 mg total) by mouth daily. 90 tablet 3   BD PEN NEEDLE NANO 2ND GEN 32G X 4 MM MISC Use daily with insulin  Dx E11.9, Z79.4 100 each 3   Blood Glucose Monitoring Suppl (ACCU-CHEK AVIVA PLUS) w/Device KIT 1 each by Does not apply route 4 (four) times daily. 1 kit 1   Blood Glucose Monitoring Suppl (ACCU-CHEK AVIVA) device Test BS BID Dx E11.69 1 each 0   carvedilol  (COREG ) 3.125 MG tablet Take 1 tablet (3.125 mg total) by mouth 2 (two) times daily. 180 tablet 3   digoxin  (LANOXIN ) 0.125 MG tablet Take 1 tablet (0.125 mg total) by mouth daily. 90 tablet 3   empagliflozin  (JARDIANCE ) 25 MG TABS tablet Take 1 tablet (25 mg total) by mouth daily. 90 tablet 3   famotidine  (PEPCID ) 20 MG tablet Take 1 tablet (20 mg total) by mouth 2 (two) times daily. 180 tablet 3   fenofibrate  (TRICOR ) 145 MG tablet TAKE 1 TABLET BY MOUTH EVERY DAY 90 tablet 3   ferrous sulfate  325 (65 FE) MG EC tablet Take 1 tablet (325 mg total) by mouth every other day. (Patient taking differently: Take 650 mg by mouth every other day.) 45 tablet 3   fluticasone  (FLONASE ) 50 MCG/ACT nasal spray Place 1 spray into both nostrils 2 (two) times daily as needed for allergies or rhinitis. 16 g 6   glucose blood (ACCU-CHEK GUIDE) test strip Check BS in the morning and at bedtime Dx E11.8 200 strip 3   hydrOXYzine  (VISTARIL ) 25 MG capsule Take  1 capsule (25 mg total) by mouth every 8 (eight) hours as needed. 30 capsule 2   insulin  glargine (LANTUS  SOLOSTAR) 100 UNIT/ML Solostar Pen Inject 10 Units into the skin daily. 9 mL 3   pantoprazole  (PROTONIX ) 40 MG tablet Take 40 mg by mouth daily.     potassium chloride  SA (KLOR-CON  M) 20 MEQ tablet Take 3 tablets (60 mEq total) by mouth daily. 90 tablet 3   spironolactone  (ALDACTONE ) 25 MG tablet Take 1 tablet (25 mg total) by mouth daily. 90 tablet 3   torsemide  (DEMADEX ) 20 MG tablet TAKE 2 TABLETS BY MOUTH 2 TIMES  DAILY. 360 tablet 3   No current facility-administered medications for this visit.    PHYSICAL EXAM: There were no vitals filed for this visit. GENERAL: NAD Lungs- *** CARDIAC:  JVP: *** cm          Normal rate with regular rhythm. *** murmur.  Pulses ***. *** edema.  ABDOMEN: Soft, non-tender, non-distended.  EXTREMITIES: Warm and well perfused.  NEUROLOGIC: No obvious FND  DATA REVIEW  ECG: 05/28/23: NSR with old anterior infarct    ECHO: 05/05/23: LVEF 20%, thrombus at LV apex, moderately reduced RV function, RVSP 60 mmHg, moderate MR 4/22: LVEF 35%-40%, normal RV function  CATH: 11/24/18:  1.  Severe three-vessel coronary artery disease.  Occluded ostial and mid LAD likely due to late presenting anterior STEMI.  Significant distal left circumflex disease but the supplied territory is small.  Significant mid RCA stenosis with occluded posterior AV groove with left-to-right collaterals. 2.  Severely reduced LV systolic function with an EF of 20 to 25% with anterior wall akinesis.  LVEDP was 32 mmHg. 3.  Successful PCI and drug-eluting stent placement to the ostial as well as mid LAD.   ASSESSMENT & PLAN:  Heart failure with reduced EF Etiology of YQ:fpkzi ischemic & nonsichemic cardiomyopathy; likely exacerbated by polysubstance abuse.  NYHA class / AHA Stage:NYHA II Volume status & Diuretics: continue torsemide  40mg  BID, CMET 05/09 reviewed. Scr stable 1.2 and K 3.3. Increased KCL to 60 mEq daily. BMET 1 week Vasodilators: Start losartan  12.5 mg nightly. Will try to space out medications and titrate slowly d/t history of hypotension and intermittent lightheadedness. Suspect some lightheadedness may be d/t low blood sugars. Eventually would like to get him on Entresto. Beta-Blocker: Continue coreg  3.125 mg BID, continue digoxin  0.125 mg daily. Check dig level w/ labs next week MRA:spironolactone  12.5mg  daily Cardiometabolic:jardiance  25mg  daily Repeat echo done today F/u  PharmD for medication titration in 3 weeks, Dr. Gardenia in 2 months Devices therapies & Valvulopathies:not currently indicated Advanced therapies:not a candidate currently, worry may need advanced therapies at some point in the future but compliance and substance use would be an issue.   2. CAD - s/p PCI to the LAD & RCA in 07/20 - continue asa 81mg  daily & lipitor  80mg  daily . Last LDL was 56 in 03/25 - no chest pain  3. MR - Moderate in severity on echo 12/24 - Suspect functional - Reassess on today's echo  4. LV thrombus - continue apixaban  5mg  BID  5. Polysubstance abuse - discussed importance of drug cessation; denies any recent use  6. Chronic venous insufficency with LE ulcers - Followed by Dr. Harden; last seen in 04/25. Improving slowly. Wearing compression stockings.    Follow-up: 3 weeks with PharmD for medication titration, 2 months with Dr. Gardenia Manuelita Dutch, PA-C

## 2023-11-23 ENCOUNTER — Encounter (HOSPITAL_COMMUNITY): Admitting: Cardiology

## 2023-11-23 ENCOUNTER — Ambulatory Visit: Admitting: Family Medicine

## 2023-11-25 ENCOUNTER — Encounter (HOSPITAL_COMMUNITY): Admitting: Cardiology

## 2023-11-25 NOTE — Progress Notes (Incomplete)
 ADVANCED HEART FAILURE CLINIC NOTE   Primary Care: Dettinger, Fonda LABOR, MD HF Cardiologist: Dr. Gardenia  HPI: Evan [name] is a 58 y.o. 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  Admitted 12/24 with acute on chronic CHF. He was diuresed. UDS + for opiates, cocaine and amphetamines. 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 He was transferred to [facility] for inpatient dermatology evaluation. Later diagnosed with prurigo nodules.  He was admitted in 01/25 with acute on chronic CHF with low-output. Required inotrope support with milrinone . Aggressively diuresed and GDMT titrated. UDS + for cocaine.   Here today for CHF follow-up. Feeling well. No dyspnea, orthopnea or PND. Has intermittent lower extremity edema, R > L, seems to be getting better with wearing compression stockings. His lower extremity wounds are healing, followed by wound clinic. Occasionally gets lightheaded, not clearly brought on by position changes. Sometimes correlates with low blood sugars.   Denies ETOH, tobacco and cocaine use.   Current Outpatient Medications  Medication Sig Dispense Refill   acetaminophen  (TYLENOL ) 325 MG tablet Take 2 tablets (650 mg total) by mouth every 6 (six) hours as needed for mild pain (pain score 1-3) or fever (or Fever >/= 101).     albuterol  (VENTOLIN  HFA) 108 (90 Base) MCG/ACT inhaler Inhale 2 puffs into the lungs as needed for wheezing or shortness of breath. 18 g 2   apixaban  (ELIQUIS ) 5 MG TABS tablet Take 1 tablet (5 mg total) by mouth 2 (two) times daily. 180 tablet 3   aspirin  EC 81 MG tablet Take 1 tablet (81 mg total) by mouth daily with breakfast.  Swallow whole. 30 tablet 12   atorvastatin  (LIPITOR ) 80 MG tablet Take 1 tablet (80 mg total) by mouth daily. 90 tablet 3   BD PEN NEEDLE NANO 2ND GEN 32G X 4 MM MISC Use daily with insulin  Dx E11.9, Z79.4 100 each 3   Blood Glucose Monitoring Suppl (ACCU-CHEK AVIVA PLUS) w/Device KIT 1 each by Does not apply route 4 (four) times daily. 1 kit 1   Blood Glucose Monitoring Suppl (ACCU-CHEK AVIVA) device Test BS BID Dx E11.69 1 each 0   carvedilol  (COREG ) 3.125 MG tablet Take 1 tablet (3.125 mg total) by mouth 2 (two) times daily. 180 tablet 3   digoxin  (LANOXIN ) 0.125 MG tablet Take 1 tablet (0.125 mg total) by mouth daily. 90 tablet 3   empagliflozin  (JARDIANCE ) 25 MG TABS tablet Take 1 tablet (25 mg total) by mouth daily. 90 tablet 3   famotidine  (PEPCID ) 20 MG tablet Take 1 tablet (20 mg total) by mouth 2 (two) times daily. 180 tablet 3   fenofibrate  (TRICOR ) 145 MG tablet TAKE 1 TABLET BY MOUTH EVERY DAY 90 tablet 3   ferrous sulfate  325 (65 FE) MG EC tablet Take 1 tablet (325 mg total) by mouth every other day. (Patient taking differently: Take 650 mg by mouth every other day.) 45 tablet 3   fluticasone  (FLONASE ) 50 MCG/ACT nasal spray Place 1 spray into both nostrils 2 (two) times daily as needed for allergies or rhinitis. 16 g 6   glucose blood (ACCU-CHEK GUIDE) test strip Check BS in the morning and at bedtime Dx E11.8 200 strip 3   hydrOXYzine  (VISTARIL ) 25 MG capsule Take  1 capsule (25 mg total) by mouth every 8 (eight) hours as needed. 30 capsule 2   insulin  glargine (LANTUS  SOLOSTAR) 100 UNIT/ML Solostar Pen Inject 10 Units into the skin daily. 9 mL 3   pantoprazole  (PROTONIX ) 40 MG tablet Take 40 mg by mouth daily.     potassium chloride  SA (KLOR-CON  M) 20 MEQ tablet Take 3 tablets (60 mEq total) by mouth daily. 90 tablet 3   spironolactone  (ALDACTONE ) 25 MG tablet Take 1 tablet (25 mg total) by mouth daily. 90 tablet 3   torsemide  (DEMADEX ) 20 MG tablet TAKE 2 TABLETS BY MOUTH 2 TIMES  DAILY. 360 tablet 3   No current facility-administered medications for this visit.    PHYSICAL EXAM: There were no vitals filed for this visit. GENERAL: NAD Lungs- *** CARDIAC:  JVP: *** cm          Normal rate with regular rhythm. *** murmur.  Pulses ***. *** edema.  ABDOMEN: Soft, non-tender, non-distended.  EXTREMITIES: Warm and well perfused.  NEUROLOGIC: No obvious FND  DATA REVIEW  ECG: 05/28/23: NSR with old anterior infarct    ECHO: 05/05/23: LVEF 20%, thrombus at LV apex, moderately reduced RV function, RVSP 60 mmHg, moderate MR 4/22: LVEF 35%-40%, normal RV function  CATH: 11/24/18:  1.  Severe three-vessel coronary artery disease.  Occluded ostial and mid LAD likely due to late presenting anterior STEMI.  Significant distal left circumflex disease but the supplied territory is small.  Significant mid RCA stenosis with occluded posterior AV groove with left-to-right collaterals. 2.  Severely reduced LV systolic function with an EF of 20 to 25% with anterior wall akinesis.  LVEDP was 32 mmHg. 3.  Successful PCI and drug-eluting stent placement to the ostial as well as mid LAD.   ASSESSMENT & PLAN:  Heart failure with reduced EF Etiology of YQ:fpkzi ischemic & nonsichemic cardiomyopathy; likely exacerbated by polysubstance abuse.  NYHA class / AHA Stage:NYHA II Volume status & Diuretics: continue torsemide  40mg  BID, CMET 05/09 reviewed. Scr stable 1.2 and K 3.3. Increased KCL to 60 mEq daily. BMET 1 week Vasodilators: Start losartan  12.5 mg nightly. Will try to space out medications and titrate slowly d/t history of hypotension and intermittent lightheadedness. Suspect some lightheadedness may be d/t low blood sugars. Eventually would like to get him on Entresto. Beta-Blocker: Continue coreg  3.125 mg BID, continue digoxin  0.125 mg daily. Check dig level w/ labs next week MRA:spironolactone  12.5mg  daily Cardiometabolic:jardiance  25mg  daily Repeat echo done today F/u  PharmD for medication titration in 3 weeks, Dr. Gardenia in 2 months Devices therapies & Valvulopathies:not currently indicated Advanced therapies:not a candidate currently, worry may need advanced therapies at some point in the future but compliance and substance use would be an issue.   2. CAD - s/p PCI to the LAD & RCA in 07/20 - continue asa 81mg  daily & lipitor  80mg  daily . Last LDL was 56 in 03/25 - no chest pain  3. MR - Moderate in severity on echo 12/24 - Suspect functional - Reassess on today's echo  4. LV thrombus - continue apixaban  5mg  BID  5. Polysubstance abuse - discussed importance of drug cessation; denies any recent use  6. Chronic venous insufficency with LE ulcers - Followed by Dr. Harden; last seen in 04/25. Improving slowly. Wearing compression stockings.    Follow-up: 3 weeks with PharmD for medication titration, 2 months with Dr. Gardenia Manuelita Dutch, PA-C

## 2023-11-27 ENCOUNTER — Other Ambulatory Visit (HOSPITAL_COMMUNITY): Payer: Self-pay | Admitting: Physician Assistant

## 2023-11-28 ENCOUNTER — Other Ambulatory Visit (HOSPITAL_COMMUNITY): Payer: Self-pay

## 2023-12-15 ENCOUNTER — Telehealth (HOSPITAL_COMMUNITY): Payer: Self-pay

## 2023-12-15 NOTE — Progress Notes (Incomplete)
 ADVANCED HEART FAILURE CLINIC NOTE   Primary Care: Dettinger, Fonda LABOR, MD HF Cardiologist: Dr. Gardenia  HPI: Evan [name] is a 58 y.o. 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  Admitted 12/24 with acute on chronic CHF. He was diuresed. UDS + for opiates, cocaine and amphetamines. 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 He was transferred to [facility] for inpatient dermatology evaluation. Later diagnosed with prurigo nodules.  He was admitted in 01/25 with acute on chronic CHF with low-output. Required inotrope support with milrinone . Aggressively diuresed and GDMT titrated. UDS + for cocaine.   Here today for CHF follow-up. Feeling well. No dyspnea, orthopnea or PND. Has intermittent lower extremity edema, R > L, seems to be getting better with wearing compression stockings. His lower extremity wounds are healing, followed by wound clinic. Occasionally gets lightheaded, not clearly brought on by position changes. Sometimes correlates with low blood sugars.   Denies ETOH, tobacco and cocaine use.   Current Outpatient Medications  Medication Sig Dispense Refill   acetaminophen  (TYLENOL ) 325 MG tablet Take 2 tablets (650 mg total) by mouth every 6 (six) hours as needed for mild pain (pain score 1-3) or fever (or Fever >/= 101).     albuterol  (VENTOLIN  HFA) 108 (90 Base) MCG/ACT inhaler Inhale 2 puffs into the lungs as needed for wheezing or shortness of breath. 18 g 2   apixaban  (ELIQUIS ) 5 MG TABS tablet Take 1 tablet (5 mg total) by mouth 2 (two) times daily. 180 tablet 3   aspirin  EC 81 MG tablet Take 1 tablet (81 mg total) by mouth daily with breakfast.  Swallow whole. 30 tablet 12   atorvastatin  (LIPITOR ) 80 MG tablet Take 1 tablet (80 mg total) by mouth daily. 90 tablet 3   BD PEN NEEDLE NANO 2ND GEN 32G X 4 MM MISC Use daily with insulin  Dx E11.9, Z79.4 100 each 3   Blood Glucose Monitoring Suppl (ACCU-CHEK AVIVA PLUS) w/Device KIT 1 each by Does not apply route 4 (four) times daily. 1 kit 1   Blood Glucose Monitoring Suppl (ACCU-CHEK AVIVA) device Test BS BID Dx E11.69 1 each 0   carvedilol  (COREG ) 3.125 MG tablet Take 1 tablet (3.125 mg total) by mouth 2 (two) times daily. 180 tablet 3   digoxin  (LANOXIN ) 0.125 MG tablet Take 1 tablet (0.125 mg total) by mouth daily. 90 tablet 3   empagliflozin  (JARDIANCE ) 25 MG TABS tablet Take 1 tablet (25 mg total) by mouth daily. 90 tablet 3   famotidine  (PEPCID ) 20 MG tablet Take 1 tablet (20 mg total) by mouth 2 (two) times daily. 180 tablet 3   fenofibrate  (TRICOR ) 145 MG tablet TAKE 1 TABLET BY MOUTH EVERY DAY 90 tablet 3   ferrous sulfate  325 (65 FE) MG EC tablet Take 1 tablet (325 mg total) by mouth every other day. (Patient taking differently: Take 650 mg by mouth every other day.) 45 tablet 3   fluticasone  (FLONASE ) 50 MCG/ACT nasal spray Place 1 spray into both nostrils 2 (two) times daily as needed for allergies or rhinitis. 16 g 6   glucose blood (ACCU-CHEK GUIDE) test strip Check BS in the morning and at bedtime Dx E11.8 200 strip 3   hydrOXYzine  (VISTARIL ) 25 MG capsule Take  1 capsule (25 mg total) by mouth every 8 (eight) hours as needed. 30 capsule 2   insulin  glargine (LANTUS  SOLOSTAR) 100 UNIT/ML Solostar Pen Inject 10 Units into the skin daily. 9 mL 3   pantoprazole  (PROTONIX ) 40 MG tablet Take 40 mg by mouth daily.     potassium chloride  SA (KLOR-CON  M) 20 MEQ tablet TAKE 3 TABLETS BY MOUTH DAILY 270 tablet 1   spironolactone  (ALDACTONE ) 25 MG tablet Take 1 tablet (25 mg total) by mouth daily. 90 tablet 3   torsemide  (DEMADEX ) 20 MG tablet TAKE 2 TABLETS BY MOUTH 2 TIMES DAILY. 360  tablet 3   No current facility-administered medications for this visit.    PHYSICAL EXAM: There were no vitals filed for this visit. GENERAL: NAD Lungs- *** CARDIAC:  JVP: *** cm          Normal rate with regular rhythm. *** murmur.  Pulses ***. *** edema.  ABDOMEN: Soft, non-tender, non-distended.  EXTREMITIES: Warm and well perfused.  NEUROLOGIC: No obvious FND  DATA REVIEW  ECG: 05/28/23: NSR with old anterior infarct    ECHO: 05/05/23: LVEF 20%, thrombus at LV apex, moderately reduced RV function, RVSP 60 mmHg, moderate MR 4/22: LVEF 35%-40%, normal RV function  CATH: 11/24/18:  1.  Severe three-vessel coronary artery disease.  Occluded ostial and mid LAD likely due to late presenting anterior STEMI.  Significant distal left circumflex disease but the supplied territory is small.  Significant mid RCA stenosis with occluded posterior AV groove with left-to-right collaterals. 2.  Severely reduced LV systolic function with an EF of 20 to 25% with anterior wall akinesis.  LVEDP was 32 mmHg. 3.  Successful PCI and drug-eluting stent placement to the ostial as well as mid LAD.   ASSESSMENT & PLAN:  Heart failure with reduced EF Etiology of YQ:fpkzi ischemic & nonsichemic cardiomyopathy; likely exacerbated by polysubstance abuse.  NYHA class / AHA Stage:NYHA II Volume status & Diuretics: continue torsemide  40mg  BID, CMET 05/09 reviewed. Scr stable 1.2 and K 3.3. Increased KCL to 60 mEq daily. BMET 1 week Vasodilators: Start losartan  12.5 mg nightly. Will try to space out medications and titrate slowly d/t history of hypotension and intermittent lightheadedness. Suspect some lightheadedness may be d/t low blood sugars. Eventually would like to get him on Entresto. Beta-Blocker: Continue coreg  3.125 mg BID, continue digoxin  0.125 mg daily. Check dig level w/ labs next week MRA:spironolactone  12.5mg  daily Cardiometabolic:jardiance  25mg  daily Repeat echo done today F/u PharmD for  medication titration in 3 weeks, Dr. Gardenia in 2 months Devices therapies & Valvulopathies:not currently indicated Advanced therapies:not a candidate currently, worry may need advanced therapies at some point in the future but compliance and substance use would be an issue.   2. CAD - s/p PCI to the LAD & RCA in 07/20 - continue asa 81mg  daily & lipitor  80mg  daily . Last LDL was 56 in 03/25 - no chest pain  3. MR - Moderate in severity on echo 12/24 - Suspect functional - Reassess on today's echo  4. LV thrombus - continue apixaban  5mg  BID  5. Polysubstance abuse - discussed importance of drug cessation; denies any recent use  6. Chronic venous insufficency with LE ulcers - Followed by Dr. Harden; last seen in 04/25. Improving slowly. Wearing compression stockings.    Follow-up: 3 weeks with PharmD for medication titration, 2 months with Dr. Gardenia Manuelita Dutch, PA-C

## 2023-12-15 NOTE — Telephone Encounter (Signed)
 Called to confirm/remind patient of their appointment at the [facility] on 12/16/23.   Appointment:   [] Confirmed  [x] Left mess   [] No answer/No voice mail  [] VM Full/unable to leave message  [] Phone not in service   And to bring in all medications and/or complete list.

## 2023-12-16 ENCOUNTER — Ambulatory Visit (HOSPITAL_COMMUNITY)
Admission: RE | Admit: 2023-12-16 | Discharge: 2023-12-16 | Disposition: A | Discharge status: Home / Self Care | Source: Ambulatory Visit | Attending: Family Medicine | Admitting: Family Medicine

## 2023-12-16 ENCOUNTER — Encounter (HOSPITAL_COMMUNITY): Payer: Self-pay

## 2023-12-16 ENCOUNTER — Encounter: Payer: Self-pay | Admitting: Family Medicine

## 2023-12-16 ENCOUNTER — Ambulatory Visit (HOSPITAL_COMMUNITY): Payer: Self-pay | Admitting: Family Medicine

## 2023-12-16 ENCOUNTER — Ambulatory Visit: Admitting: Family Medicine

## 2023-12-16 VITALS — BP 111/75 | HR 82 | Temp 97.8°F | Ht 77.0 in | Wt 220.2 lb

## 2023-12-16 VITALS — BP 114/72 | HR 54 | Ht 77.0 in | Wt 220.2 lb

## 2023-12-16 DIAGNOSIS — E118 Type 2 diabetes mellitus with unspecified complications: Secondary | ICD-10-CM

## 2023-12-16 DIAGNOSIS — I513 Intracardiac thrombosis, not elsewhere classified: Secondary | ICD-10-CM | POA: Insufficient documentation

## 2023-12-16 DIAGNOSIS — I25118 Atherosclerotic heart disease of native coronary artery with other forms of angina pectoris: Secondary | ICD-10-CM

## 2023-12-16 DIAGNOSIS — I10 Essential (primary) hypertension: Secondary | ICD-10-CM | POA: Diagnosis not present

## 2023-12-16 DIAGNOSIS — I5022 Chronic systolic (congestive) heart failure: Secondary | ICD-10-CM | POA: Diagnosis not present

## 2023-12-16 DIAGNOSIS — I480 Paroxysmal atrial fibrillation: Secondary | ICD-10-CM | POA: Diagnosis not present

## 2023-12-16 DIAGNOSIS — Z955 Presence of coronary angioplasty implant and graft: Secondary | ICD-10-CM | POA: Diagnosis not present

## 2023-12-16 DIAGNOSIS — Z794 Long term (current) use of insulin: Secondary | ICD-10-CM | POA: Diagnosis not present

## 2023-12-16 DIAGNOSIS — I5042 Chronic combined systolic (congestive) and diastolic (congestive) heart failure: Secondary | ICD-10-CM

## 2023-12-16 DIAGNOSIS — Z9861 Coronary angioplasty status: Secondary | ICD-10-CM | POA: Diagnosis not present

## 2023-12-16 DIAGNOSIS — L84 Corns and callosities: Secondary | ICD-10-CM | POA: Diagnosis not present

## 2023-12-16 DIAGNOSIS — R351 Nocturia: Secondary | ICD-10-CM

## 2023-12-16 DIAGNOSIS — I83029 Varicose veins of left lower extremity with ulcer of unspecified site: Secondary | ICD-10-CM

## 2023-12-16 DIAGNOSIS — E782 Mixed hyperlipidemia: Secondary | ICD-10-CM

## 2023-12-16 DIAGNOSIS — D509 Iron deficiency anemia, unspecified: Secondary | ICD-10-CM

## 2023-12-16 DIAGNOSIS — Z79899 Other long term (current) drug therapy: Secondary | ICD-10-CM | POA: Diagnosis not present

## 2023-12-16 DIAGNOSIS — I872 Venous insufficiency (chronic) (peripheral): Secondary | ICD-10-CM | POA: Diagnosis not present

## 2023-12-16 DIAGNOSIS — L97929 Non-pressure chronic ulcer of unspecified part of left lower leg with unspecified severity: Secondary | ICD-10-CM | POA: Diagnosis not present

## 2023-12-16 DIAGNOSIS — L97919 Non-pressure chronic ulcer of unspecified part of right lower leg with unspecified severity: Secondary | ICD-10-CM

## 2023-12-16 DIAGNOSIS — I110 Hypertensive heart disease with heart failure: Secondary | ICD-10-CM | POA: Insufficient documentation

## 2023-12-16 DIAGNOSIS — E11628 Type 2 diabetes mellitus with other skin complications: Secondary | ICD-10-CM

## 2023-12-16 DIAGNOSIS — I428 Other cardiomyopathies: Secondary | ICD-10-CM | POA: Insufficient documentation

## 2023-12-16 DIAGNOSIS — E11622 Type 2 diabetes mellitus with other skin ulcer: Secondary | ICD-10-CM | POA: Insufficient documentation

## 2023-12-16 DIAGNOSIS — I2510 Atherosclerotic heart disease of native coronary artery without angina pectoris: Secondary | ICD-10-CM | POA: Diagnosis not present

## 2023-12-16 DIAGNOSIS — I83019 Varicose veins of right lower extremity with ulcer of unspecified site: Secondary | ICD-10-CM

## 2023-12-16 DIAGNOSIS — Z7984 Long term (current) use of oral hypoglycemic drugs: Secondary | ICD-10-CM | POA: Insufficient documentation

## 2023-12-16 DIAGNOSIS — I340 Nonrheumatic mitral (valve) insufficiency: Secondary | ICD-10-CM | POA: Diagnosis not present

## 2023-12-16 DIAGNOSIS — I252 Old myocardial infarction: Secondary | ICD-10-CM | POA: Diagnosis not present

## 2023-12-16 DIAGNOSIS — Z7901 Long term (current) use of anticoagulants: Secondary | ICD-10-CM | POA: Diagnosis not present

## 2023-12-16 DIAGNOSIS — G4733 Obstructive sleep apnea (adult) (pediatric): Secondary | ICD-10-CM | POA: Diagnosis not present

## 2023-12-16 DIAGNOSIS — E785 Hyperlipidemia, unspecified: Secondary | ICD-10-CM | POA: Diagnosis not present

## 2023-12-16 DIAGNOSIS — F1910 Other psychoactive substance abuse, uncomplicated: Secondary | ICD-10-CM | POA: Diagnosis not present

## 2023-12-16 DIAGNOSIS — I255 Ischemic cardiomyopathy: Secondary | ICD-10-CM | POA: Insufficient documentation

## 2023-12-16 DIAGNOSIS — R5383 Other fatigue: Secondary | ICD-10-CM | POA: Diagnosis not present

## 2023-12-16 LAB — BASIC METABOLIC PANEL WITH GFR
Anion gap: 12 (ref 5–15)
BUN: 27 mg/dL — ABNORMAL HIGH (ref 6–20)
CO2: 25 mmol/L (ref 22–32)
Calcium: 9.6 mg/dL (ref 8.9–10.3)
Chloride: 101 mmol/L (ref 98–111)
Creatinine, Ser: 1.41 mg/dL — ABNORMAL HIGH (ref 0.61–1.24)
GFR, Estimated: 58 mL/min — ABNORMAL LOW (ref >60)
Glucose, Bld: 145 mg/dL — ABNORMAL HIGH (ref 70–99)
Potassium: 3.8 mmol/L (ref 3.5–5.1)
Sodium: 138 mmol/L (ref 135–145)

## 2023-12-16 LAB — CBC
HCT: 52.2 % — ABNORMAL HIGH (ref 39.0–52.0)
Hemoglobin: 17.2 g/dL — ABNORMAL HIGH (ref 13.0–17.0)
MCH: 30.1 pg (ref 26.0–34.0)
MCHC: 33 g/dL (ref 30.0–36.0)
MCV: 91.3 fL (ref 80.0–100.0)
Platelets: 206 K/uL (ref 150–400)
RBC: 5.72 MIL/uL (ref 4.22–5.81)
RDW: 16.6 % — ABNORMAL HIGH (ref 11.5–15.5)
WBC: 7.7 K/uL (ref 4.0–10.5)
nRBC: 0 % (ref 0.0–0.2)

## 2023-12-16 LAB — BRAIN NATRIURETIC PEPTIDE: B Natriuretic Peptide: 739 pg/mL — ABNORMAL HIGH (ref 0.0–100.0)

## 2023-12-16 LAB — IRON AND TIBC
Iron: 79 ug/dL (ref 45–182)
Saturation Ratios: 15 % — ABNORMAL LOW (ref 17.9–39.5)
TIBC: 512 ug/dL — ABNORMAL HIGH (ref 250–450)
UIBC: 433 ug/dL

## 2023-12-16 LAB — BAYER DCA HB A1C WAIVED: HB A1C (BAYER DCA - WAIVED): 6.6 % — ABNORMAL HIGH (ref 4.8–5.6)

## 2023-12-16 LAB — FERRITIN: Ferritin: 127 ng/mL (ref 24–336)

## 2023-12-16 NOTE — Progress Notes (Signed)
 ReDS Vest / Clip - 12/16/23 1100       ReDS Vest / Clip   Station Marker D    Ruler Value 32    ReDS Value Range Low volume    ReDS Actual Value 33

## 2023-12-16 NOTE — Progress Notes (Signed)
 BP 111/75   Pulse 82   Temp 97.8 F (36.6 C) (Temporal)   Ht 6' 5 (1.956 m)   Wt 220 lb 3.2 oz (99.9 kg)   SpO2 97%   BMI 26.11 kg/m    Subjective:   Patient ID: Evan [name], male    DOB: [birth year], 58 y.o.   MRN: [id number]  HPI: Evan [name] is a 58 y.o. male presenting on 12/16/2023 for Medical Management of Chronic Issues   HPI Type 2 diabetes mellitus Patient comes in today for recheck of his diabetes. Patient has been currently taking Jardiance  and Lantus . Patient is not currently on an ACE inhibitor/ARB. Patient has not seen an ophthalmologist this year.  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 The symptom started onset as an adult hypertension and CHF and hyperlipidemia and diabetic leg wounds ARE RELATED TO DM   Hypertension and CHF Patient is currently on carvedilol  and digoxin  and Jardiance  and spironolactone  and torsemide , and their blood pressure today is 111/75. Patient denies any lightheadedness or dizziness. Patient denies headaches, blurred vision, chest pains, shortness of breath, or weakness. Denies any side effects from medication and is content with current medication.   Hyperlipidemia Patient is coming in for recheck of his hyperlipidemia. The patient is currently taking fenofibrate  and atorvastatin . They deny any issues with myalgias or history of liver damage from it. They deny any focal numbness or weakness or chest pain.   Relevant past medical, surgical, family and social history reviewed and updated as indicated. Interim medical history since our last visit reviewed. Allergies and medications reviewed and updated.  Review of Systems  Constitutional:  Negative for chills and fever.  Eyes:  Negative for discharge.  Respiratory:  Negative for shortness of breath and wheezing.   Cardiovascular:   Negative for chest pain and leg swelling.  Musculoskeletal:  Negative for back pain and gait problem.  Skin:  Positive for wound. Negative for color change and rash.  All other systems reviewed and are negative.   Per HPI unless specifically indicated above   Allergies as of 12/16/2023       Reactions   Metoprolol     Hypotension         Medication List        Accurate as of December 16, 2023  3:26 PM. If you have any questions, ask your nurse or doctor.          Accu-Chek Aviva device Test BS BID Dx E11.69   Accu-Chek Aviva Plus w/Device Kit 1 each by Does not apply route 4 (four) times daily.   Accu-Chek Guide test strip Generic drug: glucose blood Check BS in the morning and at bedtime Dx E11.8   acetaminophen  325 MG tablet Commonly known as: TYLENOL  Take 2 tablets (650 mg total) by mouth every 6 (six) hours as needed for mild pain (pain score 1-3) or fever (or Fever >/= 101).   apixaban  5 MG Tabs tablet Commonly known as: ELIQUIS  Take 1 tablet (5 mg total) by mouth 2 (two) times daily.   atorvastatin  80 MG tablet Commonly known as: LIPITOR  Take 1 tablet (80 mg total) by mouth daily.   BD Pen Needle Nano 2nd Gen 32G X 4 MM Misc Generic drug: Insulin  Pen Needle Use daily with insulin  Dx E11.9, Z79.4  carvedilol  3.125 MG tablet Commonly known as: Coreg  Take 1 tablet (3.125 mg total) by mouth 2 (two) times daily.   digoxin  0.125 MG tablet Commonly known as: LANOXIN  Take 1 tablet (0.125 mg total) by mouth daily.   empagliflozin  25 MG Tabs tablet Commonly known as: Jardiance  Take 1 tablet (25 mg total) by mouth daily.   famotidine  20 MG tablet Commonly known as: Pepcid  Take 1 tablet (20 mg total) by mouth 2 (two) times daily.   fenofibrate  145 MG tablet Commonly known as: TRICOR  TAKE 1 TABLET BY MOUTH EVERY DAY   ferrous sulfate  325 (65 FE) MG EC tablet Take 1 tablet (325 mg total) by mouth every other day.   fluticasone  50 MCG/ACT nasal  spray Commonly known as: FLONASE  Place 1 spray into both nostrils 2 (two) times daily as needed for allergies or rhinitis.   hydrOXYzine  25 MG capsule Commonly known as: VISTARIL  Take 1 capsule (25 mg total) by mouth every 8 (eight) hours as needed.   Lantus  SoloStar 100 UNIT/ML Solostar Pen Generic drug: insulin  glargine Inject 10 Units into the skin daily.   pantoprazole  40 MG tablet Commonly known as: PROTONIX  Take 40 mg by mouth daily.   potassium chloride  SA 20 MEQ tablet Commonly known as: KLOR-CON  M TAKE 3 TABLETS BY MOUTH DAILY   spironolactone  25 MG tablet Commonly known as: ALDACTONE  Take 1 tablet (25 mg total) by mouth daily.   torsemide  20 MG tablet Commonly known as: DEMADEX  TAKE 2 TABLETS BY MOUTH 2 TIMES DAILY.   Ventolin  HFA 108 (90 Base) MCG/ACT inhaler Generic drug: albuterol  Inhale 2 puffs into the lungs as needed for wheezing or shortness of breath.         Objective:   BP 111/75   Pulse 82   Temp 97.8 F (36.6 C) (Temporal)   Ht 6' 5 (1.956 m)   Wt 220 lb 3.2 oz (99.9 kg)   SpO2 97%   BMI 26.11 kg/m   Wt Readings from Last 3 Encounters:  12/16/23 220 lb 3.2 oz (99.9 kg)  12/16/23 220 lb 3.2 oz (99.9 kg)  09/29/23 231 lb 9.6 oz (105.1 kg)    Physical Exam Vitals and nursing note reviewed.  Constitutional:      General: He is not in acute distress.    Appearance: He is well-developed. He is not diaphoretic.  Eyes:     General: No scleral icterus.       Right eye: No discharge.     Conjunctiva/sclera: Conjunctivae normal.     Pupils: Pupils are equal, round, and reactive to light.  Neck:     Thyroid: No thyromegaly.  Cardiovascular:     Rate and Rhythm: Normal rate and regular rhythm.     Heart sounds: Normal heart sounds. No murmur heard. Pulmonary:     Effort: Pulmonary effort is normal. No respiratory distress.     Breath sounds: Normal breath sounds. No wheezing.  Musculoskeletal:        General: Normal range of motion.      Cervical back: Neck supple.  Lymphadenopathy:     Cervical: No cervical adenopathy.  Skin:    General: Skin is warm and dry.     Findings: Wound (Superficial wounds on both inner legs, see figure.  Both are mostly scabbed over and no signs of erythema or drainage) present. No rash.      Neurological:     Mental Status: He is alert and oriented to person, place, and time.  Coordination: Coordination normal.  Psychiatric:        Behavior: Behavior normal.       Assessment & Plan:   Problem List Items Addressed This Visit       Cardiovascular and Mediastinum   Essential hypertension   Relevant Orders   CMP14+EGFR   CHF (congestive heart failure) (HCC)   CAD S/P PCI     Endocrine   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)   Type 2 diabetes mellitus with complications (HCC) - Primary   Relevant Orders   Bayer DCA Hb A1c Waived     Other   Mixed hyperlipidemia   Relevant Orders   Lipid panel   Other Visit Diagnoses       Other fatigue       Relevant Orders   TSH     Nocturia       Relevant Orders   PSA, total and free     A1c looks good at 6.6.  Blood pressure looks good.  Wound care for his lower extremity wound on both legs.  He he does need to make another appointment with them but will continue to work with them and they are progressing in their healing.  Follow up plan: Return in about 3 months (around 03/17/2024), or if symptoms worsen or fail to improve, for Diabetes recheck.  Counseling provided for all of the vaccine components Orders Placed This Encounter  Procedures   TSH   PSA, total and free   Bayer DCA Hb A1c Waived   Lipid panel   CMP14+EGFR    Fonda Levins, MD [facility] 12/16/2023, 3:26 PM

## 2023-12-16 NOTE — Patient Instructions (Addendum)
 Good to see you today!  STOP Aspirin   Your physician has recommended that you have a cardiopulmonary stress test (CPX). CPX testing is a non-invasive measurement of heart and lung function. It replaces a traditional treadmill stress test. This type of test provides a tremendous amount of information that relates not only to your present condition but also for future outcomes. This test combines measurements of you ventilation, respiratory gas exchange in the lungs, electrocardiogram (EKG), blood pressure and physical response before, during, and following an exercise protocol.  Labs done today, your results will be available in MyChart, we will contact you for abnormal readings.  You have been referred to cardiac rehab they will call to schedule an appointment  Your physician recommends that you schedule a follow-up appointment as scheduled  If you have any questions or concerns before your next appointment please send us  a message through [place] or call our office at [phone].    TO LEAVE A MESSAGE FOR THE NURSE SELECT OPTION 2, PLEASE LEAVE A MESSAGE INCLUDING: YOUR NAME DATE OF BIRTH CALL BACK NUMBER REASON FOR CALL**this is important as we prioritize the call backs  YOU WILL RECEIVE A CALL BACK THE SAME DAY AS LONG AS YOU CALL BEFORE 4:00 PM At the [facility], you and your health needs are our priority. As part of our continuing mission to provide you with exceptional heart care, we have created designated Provider Care Teams. These Care Teams include your primary Cardiologist (physician) and Advanced Practice Providers (APPs- Physician Assistants and Nurse Practitioners) who all work together to provide you with the care you need, when you need it.   You may see any of the following providers on your designated Care Team at your next follow up: Dr Toribio Fuel Dr Ezra Shuck Dr. Ria Commander Dr. Morene Brownie Amy Lenetta, NP Caffie Shed,  [place] [facility] [place], [place] Beckey Coe, NP [place] Lee, NP Ellouise Class, NP Tinnie Redman, PharmD Jaun Bash, PharmD   Please be sure to bring in all your medications bottles to every appointment.    Thank you for choosing [facility]

## 2023-12-17 ENCOUNTER — Telehealth: Payer: Self-pay | Admitting: *Deleted

## 2023-12-17 ENCOUNTER — Other Ambulatory Visit (HOSPITAL_COMMUNITY): Payer: Self-pay

## 2023-12-17 DIAGNOSIS — I10 Essential (primary) hypertension: Secondary | ICD-10-CM

## 2023-12-17 DIAGNOSIS — E118 Type 2 diabetes mellitus with unspecified complications: Secondary | ICD-10-CM

## 2023-12-17 DIAGNOSIS — I5022 Chronic systolic (congestive) heart failure: Secondary | ICD-10-CM

## 2023-12-17 DIAGNOSIS — E11628 Type 2 diabetes mellitus with other skin complications: Secondary | ICD-10-CM

## 2023-12-17 LAB — CMP14+EGFR
ALT: 12 IU/L (ref 0–44)
AST: 21 IU/L (ref 0–40)
Albumin: 4.4 g/dL (ref 3.8–4.9)
Alkaline Phosphatase: 86 IU/L (ref 44–121)
BUN/Creatinine Ratio: 24 — ABNORMAL HIGH (ref 9–20)
BUN: 29 mg/dL — ABNORMAL HIGH (ref 6–24)
Bilirubin Total: 0.9 mg/dL (ref 0.0–1.2)
CO2: 23 mmol/L (ref 20–29)
Calcium: 9.7 mg/dL (ref 8.7–10.2)
Chloride: 96 mmol/L (ref 96–106)
Creatinine, Ser: 1.23 mg/dL (ref 0.76–1.27)
Globulin, Total: 3.2 g/dL (ref 1.5–4.5)
Glucose: 120 mg/dL — ABNORMAL HIGH (ref 70–99)
Potassium: 4 mmol/L (ref 3.5–5.2)
Sodium: 140 mmol/L (ref 134–144)
Total Protein: 7.6 g/dL (ref 6.0–8.5)
eGFR: 68 mL/min/1.73 (ref >59)

## 2023-12-17 LAB — TSH: TSH: 1.99 u[IU]/mL (ref 0.450–4.500)

## 2023-12-17 LAB — LIPID PANEL
Chol/HDL Ratio: 3.5 ratio (ref 0.0–5.0)
Cholesterol, Total: 130 mg/dL (ref 100–199)
HDL: 37 mg/dL — ABNORMAL LOW (ref >39)
LDL Chol Calc (NIH): 74 mg/dL (ref 0–99)
Triglycerides: 103 mg/dL (ref 0–149)
VLDL Cholesterol Cal: 19 mg/dL (ref 5–40)

## 2023-12-17 LAB — PSA, TOTAL AND FREE
PSA, Free Pct: 40 %
PSA, Free: 0.08 ng/mL
Prostate Specific Ag, Serum: 0.2 ng/mL (ref 0.0–4.0)

## 2023-12-17 NOTE — Progress Notes (Signed)
 Complex Care Management Note Care Guide Note  12/17/2023 Name: Evan [name] MRN: [id number] DOB: 04-27-1966   Complex Care Management Outreach Attempts: An unsuccessful telephone outreach was attempted today to offer the patient information about available complex care management services.  Follow Up Plan:  Additional outreach attempts will be made to offer the patient complex care management information and services.   Encounter Outcome:  No Answer  Harlene Satterfield  [facility]  [facility], [facility] Guide  Direct Dial: [phone]  Fax [phone]

## 2023-12-18 ENCOUNTER — Other Ambulatory Visit (HOSPITAL_COMMUNITY): Payer: Self-pay

## 2023-12-18 DIAGNOSIS — I5022 Chronic systolic (congestive) heart failure: Secondary | ICD-10-CM

## 2023-12-18 NOTE — Progress Notes (Signed)
 Complex Care Management Note Care Guide Note  12/18/2023 Name: Evan [name] MRN: [id number] DOB: 1965-05-25   Complex Care Management Outreach Attempts: A second unsuccessful outreach was attempted today to offer the patient with information about available complex care management services.  Follow Up Plan:  Additional outreach attempts will be made to offer the patient complex care management information and services.   Encounter Outcome:  No Answer  Harlene Satterfield  [facility]  [facility], [facility] Guide  Direct Dial: [phone]  Fax [phone]

## 2023-12-21 NOTE — Progress Notes (Signed)
 Complex Care Management Note Care Guide Note  12/21/2023 Name: Evan [name] MRN: [id number] DOB: August 22, 1965   Complex Care Management Outreach Attempts: A third unsuccessful outreach was attempted today to offer the patient with information about available complex care management services.  Follow Up Plan:  No further outreach attempts will be made at this time. We have been unable to contact the patient to offer or enroll patient in complex care management services.  Encounter Outcome:  No Answer  Harlene Satterfield  [facility]  [facility] Guide  Direct Dial: [phone]  Fax [phone]

## 2023-12-23 ENCOUNTER — Encounter (HOSPITAL_COMMUNITY): Payer: Self-pay

## 2023-12-23 ENCOUNTER — Other Ambulatory Visit: Payer: Self-pay | Admitting: Family Medicine

## 2023-12-23 ENCOUNTER — Other Ambulatory Visit (HOSPITAL_COMMUNITY): Payer: Self-pay

## 2023-12-23 ENCOUNTER — Ambulatory Visit: Payer: Self-pay | Admitting: Family Medicine

## 2023-12-23 DIAGNOSIS — I5022 Chronic systolic (congestive) heart failure: Secondary | ICD-10-CM

## 2023-12-23 DIAGNOSIS — F410 Panic disorder [episodic paroxysmal anxiety] without agoraphobia: Secondary | ICD-10-CM

## 2023-12-28 ENCOUNTER — Other Ambulatory Visit (HOSPITAL_COMMUNITY): Payer: Self-pay | Admitting: Family Medicine

## 2023-12-28 ENCOUNTER — Telehealth (HOSPITAL_COMMUNITY): Payer: Self-pay

## 2023-12-28 ENCOUNTER — Telehealth (HOSPITAL_COMMUNITY): Payer: Self-pay | Admitting: Family Medicine

## 2023-12-28 ENCOUNTER — Ambulatory Visit (HOSPITAL_COMMUNITY): Attending: Cardiology

## 2023-12-28 DIAGNOSIS — I5022 Chronic systolic (congestive) heart failure: Secondary | ICD-10-CM | POA: Diagnosis not present

## 2023-12-28 DIAGNOSIS — I5042 Chronic combined systolic (congestive) and diastolic (congestive) heart failure: Secondary | ICD-10-CM

## 2023-12-28 DIAGNOSIS — D509 Iron deficiency anemia, unspecified: Secondary | ICD-10-CM

## 2023-12-28 NOTE — Telephone Encounter (Signed)
 Auth Submission: NO AUTH NEEDED Site of care: Site of care: [facility] Payer: [employer] Medicaid Medication & CPT/J Code(s) submitted: Feraheme  (ferumoxytol ) [id number] Diagnosis Code: I50.42, D50.9 Route of submission (phone, fax, portal):  Phone # Fax # Auth type: Buy/Bill HB Units/visits requested: 510mg  x 2 doses Reference number:  Approval from: 12/28/23 to 04/28/24

## 2023-12-28 NOTE — Addendum Note (Signed)
 Addended by: TITA AGE B on: 12/28/2023 01:25 PM   Modules accepted: Orders

## 2023-12-28 NOTE — Telephone Encounter (Signed)
 Patient referred to infusion pharmacy team for ambulatory infusion of IV iron.  Insurance - [place] Medicaid prepaid  Site of care - Site of care: [facility] Dx code - I50.22/D50.9  IV Iron Therapy - Feraheme  510 mg IV x 2  Infusion appointments - Scheduling team will schedule patient as soon as possible.   Evan [name] D. Nicholas Ossa, PharmD

## 2023-12-29 ENCOUNTER — Encounter (HOSPITAL_COMMUNITY): Attending: Family Medicine

## 2023-12-29 ENCOUNTER — Telehealth (HOSPITAL_COMMUNITY): Payer: Self-pay

## 2023-12-29 DIAGNOSIS — E611 Iron deficiency: Secondary | ICD-10-CM | POA: Insufficient documentation

## 2023-12-31 DIAGNOSIS — I5022 Chronic systolic (congestive) heart failure: Secondary | ICD-10-CM | POA: Diagnosis not present

## 2024-01-04 ENCOUNTER — Telehealth (HOSPITAL_COMMUNITY): Payer: Self-pay | Admitting: *Deleted

## 2024-01-04 ENCOUNTER — Encounter (HOSPITAL_COMMUNITY)

## 2024-01-04 NOTE — Telephone Encounter (Signed)
 Called patient when they did not show up for orientation to reschedule.  Left message.

## 2024-01-05 ENCOUNTER — Inpatient Hospital Stay (HOSPITAL_COMMUNITY): Admission: RE | Admit: 2024-01-05 | Source: Ambulatory Visit

## 2024-01-06 ENCOUNTER — Other Ambulatory Visit: Payer: Self-pay

## 2024-01-08 ENCOUNTER — Encounter: Admitting: Internal Medicine

## 2024-01-08 VITALS — BP 106/70 | HR 61 | Temp 97.3°F | Resp 16

## 2024-01-08 DIAGNOSIS — E611 Iron deficiency: Secondary | ICD-10-CM | POA: Diagnosis not present

## 2024-01-08 MED ORDER — ACETAMINOPHEN 325 MG PO TABS
650.0000 mg | ORAL_TABLET | Freq: Once | ORAL | Status: AC
  Administered 2024-01-08: 650 mg via ORAL

## 2024-01-08 MED ORDER — SODIUM CHLORIDE 0.9 % IV SOLN
510.0000 mg | Freq: Once | INTRAVENOUS | Status: AC
Start: 2024-01-08 — End: 2024-01-08
  Administered 2024-01-08: 510 mg via INTRAVENOUS
  Filled 2024-01-08: qty 17

## 2024-01-08 MED ORDER — DIPHENHYDRAMINE HCL 25 MG PO CAPS
25.0000 mg | ORAL_CAPSULE | Freq: Once | ORAL | Status: AC
  Administered 2024-01-08: 25 mg via ORAL

## 2024-01-08 NOTE — Progress Notes (Signed)
 Diagnosis: Iron Deficiency Anemia  Provider:  Harlene Glena BRISTLE  Procedure: IV Infusion  IV Type: Peripheral, IV Location: R Antecubital  Feraheme  (Ferumoxytol ), Dose: 510 mg  Infusion Start Time: 1006  Infusion Stop Time: 1022  Post Infusion IV Care: Observation period completed  Discharge: Condition: Good, Destination: Home . AVS Provided  Performed by:  Blanca Selinda SAUNDERS, LPN

## 2024-01-12 ENCOUNTER — Encounter (HOSPITAL_COMMUNITY)

## 2024-01-19 ENCOUNTER — Encounter: Admitting: Internal Medicine

## 2024-01-19 VITALS — BP 93/64 | HR 61 | Temp 97.9°F | Resp 16

## 2024-01-19 DIAGNOSIS — E611 Iron deficiency: Secondary | ICD-10-CM | POA: Diagnosis present

## 2024-01-19 DIAGNOSIS — I5022 Chronic systolic (congestive) heart failure: Secondary | ICD-10-CM | POA: Diagnosis present

## 2024-01-19 MED ORDER — ACETAMINOPHEN 325 MG PO TABS
650.0000 mg | ORAL_TABLET | Freq: Once | ORAL | Status: AC
  Administered 2024-01-19: 650 mg via ORAL

## 2024-01-19 MED ORDER — DIPHENHYDRAMINE HCL 25 MG PO CAPS
25.0000 mg | ORAL_CAPSULE | Freq: Once | ORAL | Status: AC
  Administered 2024-01-19: 25 mg via ORAL

## 2024-01-19 MED ORDER — SODIUM CHLORIDE 0.9 % IV SOLN
510.0000 mg | Freq: Once | INTRAVENOUS | Status: AC
  Administered 2024-01-19: 510 mg via INTRAVENOUS
  Filled 2024-01-19: qty 17

## 2024-01-19 NOTE — Progress Notes (Signed)
 Diagnosis: Iron Deficiency Anemia  Provider:  Glena Harlene HERO, FNP  Procedure: IV Infusion  IV Type: Peripheral, IV Location: L Upper Arm  Feraheme  (Ferumoxytol ), Dose: 510 mg  Infusion Start Time: 1039  Infusion Stop Time: 1050  Post Infusion IV Care: Observation period completed  Discharge: Condition: Good, Destination: Home . AVS Provided  Performed by:  Blanca Selinda SAUNDERS, LPN

## 2024-01-25 ENCOUNTER — Telehealth (HOSPITAL_COMMUNITY): Payer: Self-pay | Admitting: Cardiology

## 2024-01-25 ENCOUNTER — Encounter
Admission: RE | Admit: 2024-01-25 | Discharge: 2024-01-25 | Disposition: A | Discharge status: Home / Self Care | Source: Ambulatory Visit | Attending: Family Medicine | Admitting: Family Medicine

## 2024-01-25 DIAGNOSIS — I5022 Chronic systolic (congestive) heart failure: Secondary | ICD-10-CM

## 2024-01-25 NOTE — Progress Notes (Signed)
 Virtual orientation visit completed for cardiac rehab with chronic HF. On-site orientation visit scheduled for 01/26/24 at 1300.

## 2024-01-26 ENCOUNTER — Encounter (HOSPITAL_COMMUNITY)
Admission: RE | Admit: 2024-01-26 | Discharge: 2024-01-26 | Disposition: A | Discharge status: Home / Self Care | Source: Ambulatory Visit | Attending: Family Medicine | Admitting: Family Medicine

## 2024-01-26 VITALS — Ht 75.0 in | Wt 217.4 lb

## 2024-01-26 DIAGNOSIS — I5022 Chronic systolic (congestive) heart failure: Secondary | ICD-10-CM | POA: Diagnosis not present

## 2024-01-26 NOTE — Patient Instructions (Signed)
 Patient Instructions  Patient Details  Name: Evan [name] MRN: [id number] Date of Birth: 06-01-1965 Referring Provider:  Dettinger, Fonda LABOR, MD  Below are your personal goals for exercise, nutrition, and risk factors. Our goal is to help you stay on track towards obtaining and maintaining these goals. We will be discussing your progress on these goals with you throughout the program.  Initial Exercise Prescription:  Initial Exercise Prescription - 01/26/24 1400       Date of Initial Exercise RX and Referring Provider   Date 01/26/24    Referring Provider Sabharwal, Aditya, DO      NuStep   Level 3    SPM 50    Minutes 15    METs 2      REL-XR   Level 2    Speed 50    Minutes 15    METs 2      Prescription Details   Frequency (times per week) 2    Duration Progress to 30 minutes of continuous aerobic without signs/symptoms of physical distress      Intensity   THRR 40-80% of Max Heartrate 103-142    Ratings of Perceived Exertion 11-13    Perceived Dyspnea 0-4      Resistance Training   Training Prescription Yes    Weight 5    Reps 10-15          Exercise Goals: Frequency: Be able to perform aerobic exercise two to three times per week in program working toward 2-5 days per week of home exercise.  Intensity: Work with a perceived exertion of 11 (fairly light) - 15 (hard) while following your exercise prescription.  We will make changes to your prescription with you as you progress through the program.   Duration: Be able to do 30 to 45 minutes of continuous aerobic exercise in addition to a 5 minute warm-up and a 5 minute cool-down routine.   Nutrition Goals: Your personal nutrition goals will be established when you do your nutrition analysis with the dietician.  The following are general nutrition guidelines to follow: Cholesterol < 200mg /day Sodium < 1500mg /day Fiber: Men over 50 yrs - 30 grams per day  Personal Goals:  Personal Goals and Risk Factors  at Admission - 01/26/24 1358       Core Components/Risk Factors/Patient Goals on Admission    Weight Management Weight Maintenance;Yes    Intervention Weight Management: Develop a combined nutrition and exercise program designed to reach desired caloric intake, while maintaining appropriate intake of nutrient and fiber, sodium and fats, and appropriate energy expenditure required for the weight goal.;Weight Management: Provide education and appropriate resources to help participant work on and attain dietary goals.    Expected Outcomes Short Term: Continue to assess and modify interventions until short term weight is achieved;Long Term: Adherence to nutrition and physical activity/exercise program aimed toward attainment of established weight goal;Weight Maintenance: Understanding of the daily nutrition guidelines, which includes 25-35% calories from fat, 7% or less cal from saturated fats, less than 200mg  cholesterol, less than 1.5gm of sodium, & 5 or more servings of fruits and vegetables daily    Improve shortness of breath with ADL's Yes    Intervention Provide education, individualized exercise plan and daily activity instruction to help decrease symptoms of SOB with activities of daily living.    Expected Outcomes Short Term: Improve cardiorespiratory fitness to achieve a reduction of symptoms when performing ADLs;Long Term: Be able to perform more ADLs without symptoms or delay the  onset of symptoms    Diabetes Yes    Intervention Provide education about proper nutrition, including hydration, and aerobic/resistive exercise prescription along with prescribed medications to achieve blood glucose in normal ranges: Fasting glucose 65-99 mg/dL;Provide education about signs/symptoms and action to take for hypo/hyperglycemia.    Expected Outcomes Short Term: Participant verbalizes understanding of the signs/symptoms and immediate care of hyper/hypoglycemia, proper foot care and importance of medication,  aerobic/resistive exercise and nutrition plan for blood glucose control.;Long Term: Attainment of HbA1C < 7%.    Heart Failure Yes    Intervention Provide a combined exercise and nutrition program that is supplemented with education, support and counseling about heart failure. Directed toward relieving symptoms such as shortness of breath, decreased exercise tolerance, and extremity edema.    Expected Outcomes Improve functional capacity of life;Short term: Attendance in program 2-3 days a week with increased exercise capacity. Reported lower sodium intake. Reported increased fruit and vegetable intake. Reports medication compliance.;Short term: Daily weights obtained and reported for increase. Utilizing diuretic protocols set by physician.;Long term: Adoption of self-care skills and reduction of barriers for early signs and symptoms recognition and intervention leading to self-care maintenance.    Hypertension Yes    Intervention Provide education on lifestyle modifcations including regular physical activity/exercise, weight management, moderate sodium restriction and increased consumption of fresh fruit, vegetables, and low fat dairy, alcohol moderation, and smoking cessation.;Monitor prescription use compliance.    Expected Outcomes Short Term: Continued assessment and intervention until BP is < 140/63mm HG in hypertensive participants. < 130/31mm HG in hypertensive participants with diabetes, heart failure or chronic kidney disease.;Long Term: Maintenance of blood pressure at goal levels.    Lipids Yes    Intervention Provide education and support for participant on nutrition & aerobic/resistive exercise along with prescribed medications to achieve LDL 70mg , HDL >40mg .    Expected Outcomes Short Term: Participant states understanding of desired cholesterol values and is compliant with medications prescribed. Participant is following exercise prescription and nutrition guidelines.;Long Term: Cholesterol  controlled with medications as prescribed, with individualized exercise RX and with personalized nutrition plan. Value goals: LDL < 70mg , HDL > 40 mg.          Tobacco Use Initial Evaluation: Social History   Tobacco Use  Smoking Status Never  Smokeless Tobacco Never    Exercise Goals and Review:  Exercise Goals     Row Name 01/26/24 1437             Exercise Goals   Increase Physical Activity Yes       Intervention Provide advice, education, support and counseling about physical activity/exercise needs.;Develop an individualized exercise prescription for aerobic and resistive training based on initial evaluation findings, risk stratification, comorbidities and participant's personal goals.       Expected Outcomes Short Term: Attend rehab on a regular basis to increase amount of physical activity.;Long Term: Add in home exercise to make exercise part of routine and to increase amount of physical activity.;Long Term: Exercising regularly at least 3-5 days a week.       Increase Strength and Stamina Yes       Intervention Provide advice, education, support and counseling about physical activity/exercise needs.;Develop an individualized exercise prescription for aerobic and resistive training based on initial evaluation findings, risk stratification, comorbidities and participant's personal goals.       Expected Outcomes Short Term: Increase workloads from initial exercise prescription for resistance, speed, and METs.;Short Term: Perform resistance training exercises routinely during rehab and  add in resistance training at home;Long Term: Improve cardiorespiratory fitness, muscular endurance and strength as measured by increased METs and functional capacity ( )       Able to understand and use rate of perceived exertion (RPE) scale Yes       Intervention Provide education and explanation on how to use RPE scale       Expected Outcomes Short Term: Able to use RPE daily in rehab to  express subjective intensity level;Long Term:  Able to use RPE to guide intensity level when exercising independently       Able to understand and use Dyspnea scale Yes       Intervention Provide education and explanation on how to use Dyspnea scale       Expected Outcomes Short Term: Able to use Dyspnea scale daily in rehab to express subjective sense of shortness of breath during exertion;Long Term: Able to use Dyspnea scale to guide intensity level when exercising independently       Knowledge and understanding of Target Heart Rate Range (THRR) Yes       Intervention Provide education and explanation of THRR including how the numbers were predicted and where they are located for reference       Expected Outcomes Short Term: Able to state/look up THRR;Long Term: Able to use THRR to govern intensity when exercising independently;Short Term: Able to use daily as guideline for intensity in rehab       Able to check pulse independently Yes       Intervention Provide education and demonstration on how to check pulse in carotid and radial arteries.;Review the importance of being able to check your own pulse for safety during independent exercise       Expected Outcomes Short Term: Able to explain why pulse checking is important during independent exercise;Long Term: Able to check pulse independently and accurately       Understanding of Exercise Prescription Yes       Intervention Provide education, explanation, and written materials on patient's individual exercise prescription       Expected Outcomes Short Term: Able to explain program exercise prescription;Long Term: Able to explain home exercise prescription to exercise independently          Copy of goals given to participant.

## 2024-01-26 NOTE — Progress Notes (Signed)
 Cardiac Individual Treatment Plan  Patient Details  Name: Evan [name] MRN: [id number] Date of Birth: 11/08/1965 Referring Provider:   Flowsheet Row CARDIAC REHAB PHASE II ORIENTATION from 01/26/2024 in [facility] CARDIAC REHABILITATION  Referring Provider Gardenia Led, DO    Initial Encounter Date:  Flowsheet Row CARDIAC REHAB PHASE II ORIENTATION from 01/26/2024 in [facility] CARDIAC REHABILITATION  Date 01/26/24    Visit Diagnosis: Heart failure, chronic systolic (HCC)  Patient's Home Medications on Admission:  Current Outpatient Medications:    acetaminophen  (TYLENOL ) 325 MG tablet, Take 2 tablets (650 mg total) by mouth every 6 (six) hours as needed for mild pain (pain score 1-3) or fever (or Fever >/= 101)., Disp: , Rfl:    albuterol  (VENTOLIN  HFA) 108 (90 Base) MCG/ACT inhaler, Inhale 2 puffs into the lungs as needed for wheezing or shortness of breath., Disp: 18 g, Rfl: 2   apixaban  (ELIQUIS ) 5 MG TABS tablet, Take 1 tablet (5 mg total) by mouth 2 (two) times daily., Disp: 180 tablet, Rfl: 3   atorvastatin  (LIPITOR ) 80 MG tablet, Take 1 tablet (80 mg total) by mouth daily., Disp: 90 tablet, Rfl: 3   BD PEN NEEDLE NANO 2ND GEN 32G X 4 MM MISC, Use daily with insulin  Dx E11.9, Z79.4, Disp: 100 each, Rfl: 3   Blood Glucose Monitoring Suppl (ACCU-CHEK AVIVA PLUS) w/Device KIT, 1 each by Does not apply route 4 (four) times daily., Disp: 1 kit, Rfl: 1   Blood Glucose Monitoring Suppl (ACCU-CHEK AVIVA) device, Test BS BID Dx E11.69, Disp: 1 each, Rfl: 0   carvedilol  (COREG ) 3.125 MG tablet, Take 1 tablet (3.125 mg total) by mouth 2 (two) times daily., Disp: 180 tablet, Rfl: 3   digoxin  (LANOXIN ) 0.125 MG tablet, Take 1 tablet (0.125 mg total) by mouth daily., Disp: 90 tablet, Rfl: 3   empagliflozin  (JARDIANCE ) 25 MG TABS tablet, Take 1 tablet (25 mg total) by mouth daily., Disp: 90 tablet, Rfl: 3   famotidine  (PEPCID ) 20 MG tablet, Take 1 tablet (20 mg total) by mouth 2 (two) times  daily. (Patient not taking: Reported on 01/25/2024), Disp: 180 tablet, Rfl: 3   fenofibrate  (TRICOR ) 145 MG tablet, TAKE 1 TABLET BY MOUTH EVERY DAY, Disp: 90 tablet, Rfl: 3   ferrous sulfate  325 (65 FE) MG EC tablet, Take 1 tablet (325 mg total) by mouth every other day., Disp: 45 tablet, Rfl: 3   fluticasone  (FLONASE ) 50 MCG/ACT nasal spray, Place 1 spray into both nostrils 2 (two) times daily as needed for allergies or rhinitis., Disp: 16 g, Rfl: 6   glucose blood (ACCU-CHEK GUIDE) test strip, Check BS in the morning and at bedtime Dx E11.8, Disp: 200 strip, Rfl: 3   hydrOXYzine  (VISTARIL ) 25 MG capsule, TAKE 1 CAPSULE (25 MG TOTAL) BY MOUTH EVERY 8 (EIGHT) HOURS AS NEEDED., Disp: 30 capsule, Rfl: 1   insulin  glargine (LANTUS  SOLOSTAR) 100 UNIT/ML Solostar Pen, Inject 10 Units into the skin daily., Disp: 9 mL, Rfl: 3   pantoprazole  (PROTONIX ) 40 MG tablet, Take 40 mg by mouth daily., Disp: , Rfl:    potassium chloride  SA (KLOR-CON  M) 20 MEQ tablet, TAKE 3 TABLETS BY MOUTH DAILY, Disp: 270 tablet, Rfl: 1   spironolactone  (ALDACTONE ) 25 MG tablet, Take 1 tablet (25 mg total) by mouth daily., Disp: 90 tablet, Rfl: 3   torsemide  (DEMADEX ) 20 MG tablet, TAKE 2 TABLETS BY MOUTH 2 TIMES DAILY., Disp: 360 tablet, Rfl: 3  Past Medical History: Past Medical History:  Diagnosis Date   Arthritis    Atrial fibrillation (HCC)    CHF (congestive heart failure) (HCC)    Diabetes mellitus without complication (HCC)    Gout    Heart attack (HCC) 10/2018   Hyperlipidemia    Hypertension    Morbid obesity (HCC) 12/22/2016   Sleep apnea    had test twice unable to complete    Tobacco Use: Social History   Tobacco Use  Smoking Status Never  Smokeless Tobacco Never    Labs: Review Flowsheet  More data exists      Latest Ref Rng & Units 06/01/2023 06/02/2023 06/03/2023 08/17/2023 12/16/2023  Labs for ITP Cardiac and Pulmonary Rehab  Cholestrol 100 - 199 mg/dL - - - 895  869   LDL (calc) 0 - 99 mg/dL - -  - 56  74   HDL-C >60 mg/dL - - - 25  37   Trlycerides 0 - 149 mg/dL - - - 875  896   Hemoglobin A1c 4.8 - 5.6 % - - - 7.8  6.6   O2 Saturation % 71.5  50.2  51.2  56.5  - -    Details       Multiple values from one day are sorted in reverse-chronological order          Exercise Target Goals: Exercise Program Goal: Individual exercise prescription set using results from initial 6 min walk test and THRR while considering  patient's activity barriers and safety.   Exercise Prescription Goal: Initial exercise prescription builds to 30-45 minutes a day of aerobic activity, 2-3 days per week.  Home exercise guidelines will be given to patient during program as part of exercise prescription that the participant will acknowledge.   Education: Aerobic Exercise: - Group verbal and visual presentation on the components of exercise prescription. Introduces F.I.T.T principle from ACSM for exercise prescriptions.  Reviews F.I.T.T. principles of aerobic exercise including progression. Written material provided at class time.   Education: Resistance Exercise: - Group verbal and visual presentation on the components of exercise prescription. Introduces F.I.T.T principle from ACSM for exercise prescriptions  Reviews F.I.T.T. principles of resistance exercise including progression. Written material provided at class time.    Education: Exercise & Equipment Safety: - Individual verbal instruction and demonstration of equipment use and safety with use of the equipment.   Education: Exercise Physiology & General Exercise Guidelines: - Group verbal and written instruction with models to review the exercise physiology of the cardiovascular system and associated critical values. Provides general exercise guidelines with specific guidelines to those with heart or lung disease. Written material provided at class time.   Education: Flexibility, Balance, Mind/Body Relaxation: - Group verbal and visual  presentation with interactive activity on the components of exercise prescription. Introduces F.I.T.T principle from ACSM for exercise prescriptions. Reviews F.I.T.T. principles of flexibility and balance exercise training including progression. Also discusses the mind body connection.  Reviews various relaxation techniques to help reduce and manage stress (i.e. Deep breathing, progressive muscle relaxation, and visualization). Balance handout provided to take home. Written material provided at class time.   Activity Barriers & Risk Stratification:  Activity Barriers & Cardiac Risk Stratification - 01/26/24 1400       Activity Barriers & Cardiac Risk Stratification   Activity Barriers Arthritis;Shortness of Breath;Back Problems;Joint Problems;Deconditioning;Muscular Weakness   chronic back and shoulder pain         6 Minute Walk:  6 Minute Walk     Row Name 01/26/24 1431  6 Minute Walk   Phase Initial     Distance 1028 feet     Walk Time 6 minutes     MPH 1.95     METS 3.05     RPE 9     VO2 Peak 10.67     Symptoms No     Resting HR 64 bpm     Resting BP 92/60     Resting Oxygen Saturation  96 %     Exercise Oxygen Saturation  during 6 min walk 99 %     Max Ex. HR 80 bpm     Max Ex. BP 134/84     2 Minute Post BP 110/70        Oxygen Initial Assessment:   Oxygen Re-Evaluation:   Oxygen Discharge (Final Oxygen Re-Evaluation):   Initial Exercise Prescription:  Initial Exercise Prescription - 01/26/24 1400       Date of Initial Exercise RX and Referring Provider   Date 01/26/24    Referring Provider Sabharwal, Aditya, DO      NuStep   Level 3    SPM 50    Minutes 15    METs 2      REL-XR   Level 2    Speed 50    Minutes 15    METs 2      Prescription Details   Frequency (times per week) 2    Duration Progress to 30 minutes of continuous aerobic without signs/symptoms of physical distress      Intensity   THRR 40-80% of Max Heartrate  103-142    Ratings of Perceived Exertion 11-13    Perceived Dyspnea 0-4      Resistance Training   Training Prescription Yes    Weight 5    Reps 10-15          Perform Capillary Blood Glucose checks as needed.  Exercise Prescription Changes:   Exercise Prescription Changes     Row Name 01/26/24 1400             Response to Exercise   Blood Pressure (Admit) 92/60       Blood Pressure (Exercise) 134/84       Blood Pressure (Exit) 110/70       Heart Rate (Admit) 64 bpm       Heart Rate (Exercise) 80 bpm       Heart Rate (Exit) 60 bpm       Oxygen Saturation (Admit) 96 %       Oxygen Saturation (Exercise) 99 %       Oxygen Saturation (Exit) 99 %       Rating of Perceived Exertion (Exercise) 9          Exercise Comments:   Exercise Goals and Review:   Exercise Goals     Row Name 01/26/24 1437             Exercise Goals   Increase Physical Activity Yes       Intervention Provide advice, education, support and counseling about physical activity/exercise needs.;Develop an individualized exercise prescription for aerobic and resistive training based on initial evaluation findings, risk stratification, comorbidities and participant's personal goals.       Expected Outcomes Short Term: Attend rehab on a regular basis to increase amount of physical activity.;Long Term: Add in home exercise to make exercise part of routine and to increase amount of physical activity.;Long Term: Exercising regularly at least 3-5 days a week.  Increase Strength and Stamina Yes       Intervention Provide advice, education, support and counseling about physical activity/exercise needs.;Develop an individualized exercise prescription for aerobic and resistive training based on initial evaluation findings, risk stratification, comorbidities and participant's personal goals.       Expected Outcomes Short Term: Increase workloads from initial exercise prescription for resistance, speed, and  METs.;Short Term: Perform resistance training exercises routinely during rehab and add in resistance training at home;Long Term: Improve cardiorespiratory fitness, muscular endurance and strength as measured by increased METs and functional capacity ( )       Able to understand and use rate of perceived exertion (RPE) scale Yes       Intervention Provide education and explanation on how to use RPE scale       Expected Outcomes Short Term: Able to use RPE daily in rehab to express subjective intensity level;Long Term:  Able to use RPE to guide intensity level when exercising independently       Able to understand and use Dyspnea scale Yes       Intervention Provide education and explanation on how to use Dyspnea scale       Expected Outcomes Short Term: Able to use Dyspnea scale daily in rehab to express subjective sense of shortness of breath during exertion;Long Term: Able to use Dyspnea scale to guide intensity level when exercising independently       Knowledge and understanding of Target Heart Rate Range (THRR) Yes       Intervention Provide education and explanation of THRR including how the numbers were predicted and where they are located for reference       Expected Outcomes Short Term: Able to state/look up THRR;Long Term: Able to use THRR to govern intensity when exercising independently;Short Term: Able to use daily as guideline for intensity in rehab       Able to check pulse independently Yes       Intervention Provide education and demonstration on how to check pulse in carotid and radial arteries.;Review the importance of being able to check your own pulse for safety during independent exercise       Expected Outcomes Short Term: Able to explain why pulse checking is important during independent exercise;Long Term: Able to check pulse independently and accurately       Understanding of Exercise Prescription Yes       Intervention Provide education, explanation, and written materials  on patient's individual exercise prescription       Expected Outcomes Short Term: Able to explain program exercise prescription;Long Term: Able to explain home exercise prescription to exercise independently          Exercise Goals Re-Evaluation :   Discharge Exercise Prescription (Final Exercise Prescription Changes):  Exercise Prescription Changes - 01/26/24 1400       Response to Exercise   Blood Pressure (Admit) 92/60    Blood Pressure (Exercise) 134/84    Blood Pressure (Exit) 110/70    Heart Rate (Admit) 64 bpm    Heart Rate (Exercise) 80 bpm    Heart Rate (Exit) 60 bpm    Oxygen Saturation (Admit) 96 %    Oxygen Saturation (Exercise) 99 %    Oxygen Saturation (Exit) 99 %    Rating of Perceived Exertion (Exercise) 9          Nutrition:  Target Goals: Understanding of nutrition guidelines, daily intake of sodium 1500mg , cholesterol 200mg , calories 30% from fat and 7% or less from saturated  fats, daily to have 5 or more servings of fruits and vegetables.  Education: Nutrition 1 -Group instruction provided by verbal, written material, interactive activities, discussions, models, and posters to present general guidelines for heart healthy nutrition including macronutrients, label reading, and promoting whole foods over processed counterparts. Education serves as Pensions consultant of discussion of heart healthy eating for all. Written material provided at class time.    Education: Nutrition 2 -Group instruction provided by verbal, written material, interactive activities, discussions, models, and posters to present general guidelines for heart healthy nutrition including sodium, cholesterol, and saturated fat. Providing guidance of habit forming to improve blood pressure, cholesterol, and body weight. Written material provided at class time.     Biometrics:  Pre Biometrics - 01/26/24 1438       Pre Biometrics   Height 6' 3 (1.905 m)    Weight 98.6 kg    Waist  Circumference 35 inches    Hip Circumference 42 inches    Waist to Hip Ratio 0.83 %    BMI (Calculated) 27.17    Grip Strength 38.6 kg    Single Leg Stand 44 seconds           Nutrition Therapy Plan and Nutrition Goals:  Nutrition Therapy & Goals - 01/26/24 1334       Intervention Plan   Intervention Nutrition handout(s) given to patient.;Prescribe, educate and counsel regarding individualized specific dietary modifications aiming towards targeted core components such as weight, hypertension, lipid management, diabetes, heart failure and other comorbidities.    Expected Outcomes Short Term Goal: Understand basic principles of dietary content, such as calories, fat, sodium, cholesterol and nutrients.;Long Term Goal: Adherence to prescribed nutrition plan.          Nutrition Assessments:  MEDIFICTS Score Key: >=70 Need to make dietary changes  40-70 Heart Healthy Diet <= 40 Therapeutic Level Cholesterol Diet  Flowsheet Row CARDIAC REHAB PHASE II ORIENTATION from 01/26/2024 in [facility] CARDIAC REHABILITATION  Picture Your Plate Total Score on Admission 56   Picture Your Plate Scores: <59 Unhealthy dietary pattern with much room for improvement. 41-50 Dietary pattern unlikely to meet recommendations for good health and room for improvement. 51-60 More healthful dietary pattern, with some room for improvement.  >60 Healthy dietary pattern, although there may be some specific behaviors that could be improved.    Nutrition Goals Re-Evaluation:   Nutrition Goals Discharge (Final Nutrition Goals Re-Evaluation):   Psychosocial: Target Goals: Acknowledge presence or absence of significant depression and/or stress, maximize coping skills, provide positive support system. Participant is able to verbalize types and ability to use techniques and skills needed for reducing stress and depression.   Education: Stress, Anxiety, and Depression - Group verbal and visual presentation to  define topics covered.  Reviews how body is impacted by stress, anxiety, and depression.  Also discusses healthy ways to reduce stress and to treat/manage anxiety and depression. Written material provided at class time.   Education: Sleep Hygiene -Provides group verbal and written instruction about how sleep can affect your health.  Define sleep hygiene, discuss sleep cycles and impact of sleep habits. Review good sleep hygiene tips.   Initial Review & Psychosocial Screening:  Initial Psych Review & Screening - 01/25/24 1148       Initial Review   Current issues with Current Psychotropic Meds;Current Sleep Concerns      Family Dynamics   Good Support System? Yes    Comments Patient's mother and daugther support him.  Barriers   Psychosocial barriers to participate in program The patient should benefit from training in stress management and relaxation.;There are no identifiable barriers or psychosocial needs.      Screening Interventions   Interventions Encouraged to exercise;Provide feedback about the scores to participant;To provide support and resources with identified psychosocial needs    Expected Outcomes Short Term goal: Identification and review with participant of any Quality of Life or Depression concerns found by scoring the questionnaire.;Long Term goal: The participant improves quality of Life and PHQ9 Scores as seen by post scores and/or verbalization of changes;Short Term goal: Utilizing psychosocial counselor, staff and physician to assist with identification of specific Stressors or current issues interfering with healing process. Setting desired goal for each stressor or current issue identified.;Long Term Goal: Stressors or current issues are controlled or eliminated.          Quality of Life Scores:   Scores of 19 and below usually indicate a poorer quality of life in these areas.  A difference of  2-3 points is a clinically meaningful difference.  A difference  of 2-3 points in the total score of the Quality of Life Index has been associated with significant improvement in overall quality of life, self-image, physical symptoms, and general health in studies assessing change in quality of life.  PHQ-9: Review Flowsheet  More data exists      01/26/2024 01/19/2024 01/08/2024 12/16/2023 08/17/2023  Depression screen PHQ 2/9  Decreased Interest 0 0 0 0 0  Down, Depressed, Hopeless 0 0 0 0 0  PHQ - 2 Score 0 0 0 0 0  Altered sleeping 0 - - 2 -  Tired, decreased energy 2 - - 0 -  Change in appetite 0 - - 0 -  Feeling bad or failure about yourself  0 - - 0 -  Trouble concentrating 0 - - 0 -  Moving slowly or fidgety/restless 0 - - 0 -  Suicidal thoughts 0 - - 0 -  PHQ-9 Score 2 - - 2 -  Difficult doing work/chores Not difficult at all - - Not difficult at all -   Interpretation of Total Score  Total Score Depression Severity:  1-4 = Minimal depression, 5-9 = Mild depression, 10-14 = Moderate depression, 15-19 = Moderately severe depression, 20-27 = Severe depression   Psychosocial Evaluation and Intervention:  Psychosocial Evaluation - 01/25/24 1149       Psychosocial Evaluation & Interventions   Interventions Stress management education;Relaxation education;Encouraged to exercise with the program and follow exercise prescription    Comments Patient referred to CR with chronic systolic HF. He denies any depression/anxiety but says he has trouble sleeping with panic feelings. He in on vistaril  prn. He is currenlty living with his mother to help her out. He is disabled. His goals for the program are to get his heart stronger and to be able to do more activities especially spend time with his 2 grandchildren. He has no barriers identified to complete the program.    Expected Outcomes Short Term: Patient will start the program and attend consistently. Long Term: Patient will complete the program meeting personal goals.    Continue Psychosocial Services   Follow up required by staff          Psychosocial Re-Evaluation:   Psychosocial Discharge (Final Psychosocial Re-Evaluation):   Vocational Rehabilitation: Provide vocational rehab assistance to qualifying candidates.   Vocational Rehab Evaluation & Intervention:  Vocational Rehab - 01/25/24 1147  Initial Vocational Rehab Evaluation & Intervention   Assessment shows need for Vocational Rehabilitation No      Vocational Rehab Re-Evaulation   Comments Disabled.          Education: Education Goals: Education classes will be provided on a variety of topics geared toward better understanding of heart health and risk factor modification. Participant will state understanding/return demonstration of topics presented as noted by education test scores.  Learning Barriers/Preferences:  Learning Barriers/Preferences - 01/25/24 1147       Learning Barriers/Preferences   Learning Barriers None    Learning Preferences Audio;Written Material;Skilled Demonstration          General Cardiac Education Topics:  AED/CPR: - Group verbal and written instruction with the use of models to demonstrate the basic use of the AED with the basic ABC's of resuscitation.   Test and Procedures: - Group verbal and visual presentation and models provide information about basic cardiac anatomy and function. Reviews the testing methods done to diagnose heart disease and the outcomes of the test results. Describes the treatment choices: Medical Management, Angioplasty, or Coronary Bypass Surgery for treating various heart conditions including Myocardial Infarction, Angina, Valve Disease, and Cardiac Arrhythmias. Written material provided at class time.   Medication Safety: - Group verbal and visual instruction to review commonly prescribed medications for heart and lung disease. Reviews the medication, class of the drug, and side effects. Includes the steps to properly store meds and maintain the  prescription regimen. Written material provided at class time.   Intimacy: - Group verbal instruction through game format to discuss how heart and lung disease can affect sexual intimacy. Written material provided at class time.   Know Your Numbers and Heart Failure: - Group verbal and visual instruction to discuss disease risk factors for cardiac and pulmonary disease and treatment options.  Reviews associated critical values for Overweight/Obesity, Hypertension, Cholesterol, and Diabetes.  Discusses basics of heart failure: signs/symptoms and treatments.  Introduces Heart Failure Zone chart for action plan for heart failure. Written material provided at class time.   Infection Prevention: - Provides verbal and written material to individual with discussion of infection control including proper hand washing and proper equipment cleaning during exercise session.   Falls Prevention: - Provides verbal and written material to individual with discussion of falls prevention and safety.   Other: -Provides group and verbal instruction on various topics (see comments)   Knowledge Questionnaire Score:   Core Components/Risk Factors/Patient Goals at Admission:  Personal Goals and Risk Factors at Admission - 01/26/24 1358       Core Components/Risk Factors/Patient Goals on Admission    Weight Management Weight Maintenance;Yes    Intervention Weight Management: Develop a combined nutrition and exercise program designed to reach desired caloric intake, while maintaining appropriate intake of nutrient and fiber, sodium and fats, and appropriate energy expenditure required for the weight goal.;Weight Management: Provide education and appropriate resources to help participant work on and attain dietary goals.    Expected Outcomes Short Term: Continue to assess and modify interventions until short term weight is achieved;Long Term: Adherence to nutrition and physical activity/exercise program aimed  toward attainment of established weight goal;Weight Maintenance: Understanding of the daily nutrition guidelines, which includes 25-35% calories from fat, 7% or less cal from saturated fats, less than 200mg  cholesterol, less than 1.5gm of sodium, & 5 or more servings of fruits and vegetables daily    Improve shortness of breath with ADL's Yes    Intervention Provide education,  individualized exercise plan and daily activity instruction to help decrease symptoms of SOB with activities of daily living.    Expected Outcomes Short Term: Improve cardiorespiratory fitness to achieve a reduction of symptoms when performing ADLs;Long Term: Be able to perform more ADLs without symptoms or delay the onset of symptoms    Diabetes Yes    Intervention Provide education about proper nutrition, including hydration, and aerobic/resistive exercise prescription along with prescribed medications to achieve blood glucose in normal ranges: Fasting glucose 65-99 mg/dL;Provide education about signs/symptoms and action to take for hypo/hyperglycemia.    Expected Outcomes Short Term: Participant verbalizes understanding of the signs/symptoms and immediate care of hyper/hypoglycemia, proper foot care and importance of medication, aerobic/resistive exercise and nutrition plan for blood glucose control.;Long Term: Attainment of HbA1C < 7%.    Heart Failure Yes    Intervention Provide a combined exercise and nutrition program that is supplemented with education, support and counseling about heart failure. Directed toward relieving symptoms such as shortness of breath, decreased exercise tolerance, and extremity edema.    Expected Outcomes Improve functional capacity of life;Short term: Attendance in program 2-3 days a week with increased exercise capacity. Reported lower sodium intake. Reported increased fruit and vegetable intake. Reports medication compliance.;Short term: Daily weights obtained and reported for increase. Utilizing  diuretic protocols set by physician.;Long term: Adoption of self-care skills and reduction of barriers for early signs and symptoms recognition and intervention leading to self-care maintenance.    Hypertension Yes    Intervention Provide education on lifestyle modifcations including regular physical activity/exercise, weight management, moderate sodium restriction and increased consumption of fresh fruit, vegetables, and low fat dairy, alcohol moderation, and smoking cessation.;Monitor prescription use compliance.    Expected Outcomes Short Term: Continued assessment and intervention until BP is < 140/69mm HG in hypertensive participants. < 130/64mm HG in hypertensive participants with diabetes, heart failure or chronic kidney disease.;Long Term: Maintenance of blood pressure at goal levels.    Lipids Yes    Intervention Provide education and support for participant on nutrition & aerobic/resistive exercise along with prescribed medications to achieve LDL 70mg , HDL >40mg .    Expected Outcomes Short Term: Participant states understanding of desired cholesterol values and is compliant with medications prescribed. Participant is following exercise prescription and nutrition guidelines.;Long Term: Cholesterol controlled with medications as prescribed, with individualized exercise RX and with personalized nutrition plan. Value goals: LDL < 70mg , HDL > 40 mg.          Education:Diabetes - Individual verbal and written instruction to review signs/symptoms of diabetes, desired ranges of glucose level fasting, after meals and with exercise. Acknowledge that pre and post exercise glucose checks will be done for 3 sessions at entry of program.   Core Components/Risk Factors/Patient Goals Review:    Core Components/Risk Factors/Patient Goals at Discharge (Final Review):    ITP Comments:  ITP Comments     Row Name 01/25/24 1153 01/26/24 1358         ITP Comments Virtual orientation visit completed  for cardiac rehab with chronic HF. On-site orientation visit scheduled for 01/26/24 at 1300. Patient attend orientation today.  Patient is attending Cardiac Rehabilitation Program.  Documentation for diagnosis [facility] found in [facility] 12/16/[facility].  Reviewed medical chart, RPE/RPD, gym safety, and program guidelines.  Patient was fitted to equipment they will be using during rehab.  Patient is scheduled to start exercise on Thursday 01/28/24 at 1500.   Initial ITP created and sent for review and signature by Dr.  Dorn Ross, Medical Director for Cardiac Rehabilitation Program.         Comments: Initial ITP.

## 2024-01-27 ENCOUNTER — Encounter (HOSPITAL_COMMUNITY): Payer: Self-pay | Admitting: *Deleted

## 2024-01-27 DIAGNOSIS — I5022 Chronic systolic (congestive) heart failure: Secondary | ICD-10-CM

## 2024-01-27 NOTE — Progress Notes (Signed)
 Cardiac Individual Treatment Plan  Patient Details  Name: Evan [name] MRN: [id number] Date of Birth: 1965-12-11 Referring Provider:   Flowsheet Row CARDIAC REHAB PHASE II ORIENTATION from 01/26/2024 in [facility] CARDIAC REHABILITATION  Referring Provider Gardenia Led, DO    Initial Encounter Date:  Flowsheet Row CARDIAC REHAB PHASE II ORIENTATION from 01/26/2024 in [facility] CARDIAC REHABILITATION  Date 01/26/24    Visit Diagnosis: Heart failure, chronic systolic (HCC)  Patient's Home Medications on Admission:  Current Outpatient Medications:    acetaminophen  (TYLENOL ) 325 MG tablet, Take 2 tablets (650 mg total) by mouth every 6 (six) hours as needed for mild pain (pain score 1-3) or fever (or Fever >/= 101)., Disp: , Rfl:    albuterol  (VENTOLIN  HFA) 108 (90 Base) MCG/ACT inhaler, Inhale 2 puffs into the lungs as needed for wheezing or shortness of breath., Disp: 18 g, Rfl: 2   apixaban  (ELIQUIS ) 5 MG TABS tablet, Take 1 tablet (5 mg total) by mouth 2 (two) times daily., Disp: 180 tablet, Rfl: 3   atorvastatin  (LIPITOR ) 80 MG tablet, Take 1 tablet (80 mg total) by mouth daily., Disp: 90 tablet, Rfl: 3   BD PEN NEEDLE NANO 2ND GEN 32G X 4 MM MISC, Use daily with insulin  Dx E11.9, Z79.4, Disp: 100 each, Rfl: 3   Blood Glucose Monitoring Suppl (ACCU-CHEK AVIVA PLUS) w/Device KIT, 1 each by Does not apply route 4 (four) times daily., Disp: 1 kit, Rfl: 1   Blood Glucose Monitoring Suppl (ACCU-CHEK AVIVA) device, Test BS BID Dx E11.69, Disp: 1 each, Rfl: 0   carvedilol  (COREG ) 3.125 MG tablet, Take 1 tablet (3.125 mg total) by mouth 2 (two) times daily., Disp: 180 tablet, Rfl: 3   digoxin  (LANOXIN ) 0.125 MG tablet, Take 1 tablet (0.125 mg total) by mouth daily., Disp: 90 tablet, Rfl: 3   empagliflozin  (JARDIANCE ) 25 MG TABS tablet, Take 1 tablet (25 mg total) by mouth daily., Disp: 90 tablet, Rfl: 3   famotidine  (PEPCID ) 20 MG tablet, Take 1 tablet (20 mg total) by mouth 2 (two) times  daily. (Patient not taking: Reported on 01/25/2024), Disp: 180 tablet, Rfl: 3   fenofibrate  (TRICOR ) 145 MG tablet, TAKE 1 TABLET BY MOUTH EVERY DAY, Disp: 90 tablet, Rfl: 3   ferrous sulfate  325 (65 FE) MG EC tablet, Take 1 tablet (325 mg total) by mouth every other day., Disp: 45 tablet, Rfl: 3   fluticasone  (FLONASE ) 50 MCG/ACT nasal spray, Place 1 spray into both nostrils 2 (two) times daily as needed for allergies or rhinitis., Disp: 16 g, Rfl: 6   glucose blood (ACCU-CHEK GUIDE) test strip, Check BS in the morning and at bedtime Dx E11.8, Disp: 200 strip, Rfl: 3   hydrOXYzine  (VISTARIL ) 25 MG capsule, TAKE 1 CAPSULE (25 MG TOTAL) BY MOUTH EVERY 8 (EIGHT) HOURS AS NEEDED., Disp: 30 capsule, Rfl: 1   insulin  glargine (LANTUS  SOLOSTAR) 100 UNIT/ML Solostar Pen, Inject 10 Units into the skin daily., Disp: 9 mL, Rfl: 3   pantoprazole  (PROTONIX ) 40 MG tablet, Take 40 mg by mouth daily., Disp: , Rfl:    potassium chloride  SA (KLOR-CON  M) 20 MEQ tablet, TAKE 3 TABLETS BY MOUTH DAILY, Disp: 270 tablet, Rfl: 1   spironolactone  (ALDACTONE ) 25 MG tablet, Take 1 tablet (25 mg total) by mouth daily., Disp: 90 tablet, Rfl: 3   torsemide  (DEMADEX ) 20 MG tablet, TAKE 2 TABLETS BY MOUTH 2 TIMES DAILY., Disp: 360 tablet, Rfl: 3  Past Medical History: Past Medical History:  Diagnosis Date   Arthritis    Atrial fibrillation (HCC)    CHF (congestive heart failure) (HCC)    Diabetes mellitus without complication (HCC)    Gout    Heart attack (HCC) 10/2018   Hyperlipidemia    Hypertension    Morbid obesity (HCC) 12/22/2016   Sleep apnea    had test twice unable to complete    Tobacco Use: Social History   Tobacco Use  Smoking Status Never  Smokeless Tobacco Never    Labs: Review Flowsheet  More data exists      Latest Ref Rng & Units 06/01/2023 06/02/2023 06/03/2023 08/17/2023 12/16/2023  Labs for ITP Cardiac and Pulmonary Rehab  Cholestrol 100 - 199 mg/dL - - - 895  869   LDL (calc) 0 - 99 mg/dL - -  - 56  74   HDL-C >60 mg/dL - - - 25  37   Trlycerides 0 - 149 mg/dL - - - 875  896   Hemoglobin A1c 4.8 - 5.6 % - - - 7.8  6.6   O2 Saturation % 71.5  50.2  51.2  56.5  - -    Details       Multiple values from one day are sorted in reverse-chronological order         Capillary Blood Glucose: Lab Results  Component Value Date   GLUCAP 132 (H) 06/03/2023   GLUCAP 142 (H) 06/03/2023   GLUCAP 127 (H) 06/02/2023   GLUCAP 185 (H) 06/02/2023   GLUCAP 182 (H) 06/02/2023     Exercise Target Goals: Exercise Program Goal: Individual exercise prescription set using results from initial 6 min walk test and THRR while considering  patient's activity barriers and safety.   Exercise Prescription Goal: Starting with aerobic activity 30 plus minutes a day, 3 days per week for initial exercise prescription. Provide home exercise prescription and guidelines that participant acknowledges understanding prior to discharge.  Activity Barriers & Risk Stratification:  Activity Barriers & Cardiac Risk Stratification - 01/26/24 1400       Activity Barriers & Cardiac Risk Stratification   Activity Barriers Arthritis;Shortness of Breath;Back Problems;Joint Problems;Deconditioning;Muscular Weakness   chronic back and shoulder pain         6 Minute Walk:  6 Minute Walk     Row Name 01/26/24 1431         6 Minute Walk   Phase Initial     Distance 1028 feet     Walk Time 6 minutes     MPH 1.95     METS 3.05     RPE 9     VO2 Peak 10.67     Symptoms No     Resting HR 64 bpm     Resting BP 92/60     Resting Oxygen Saturation  96 %     Exercise Oxygen Saturation  during 6 min walk 99 %     Max Ex. HR 80 bpm     Max Ex. BP 134/84     2 Minute Post BP 110/70        Oxygen Initial Assessment:   Oxygen Re-Evaluation:   Oxygen Discharge (Final Oxygen Re-Evaluation):   Initial Exercise Prescription:  Initial Exercise Prescription - 01/26/24 1400       Date of Initial Exercise  RX and Referring Provider   Date 01/26/24    Referring Provider Sabharwal, Aditya, DO      NuStep   Level 3    SPM 50  Minutes 15    METs 2      REL-XR   Level 2    Speed 50    Minutes 15    METs 2      Prescription Details   Frequency (times per week) 2    Duration Progress to 30 minutes of continuous aerobic without signs/symptoms of physical distress      Intensity   THRR 40-80% of Max Heartrate 103-142    Ratings of Perceived Exertion 11-13    Perceived Dyspnea 0-4      Resistance Training   Training Prescription Yes    Weight 5    Reps 10-15          Perform Capillary Blood Glucose checks as needed.  Exercise Prescription Changes:   Exercise Prescription Changes     Row Name 01/26/24 1400             Response to Exercise   Blood Pressure (Admit) 92/60       Blood Pressure (Exercise) 134/84       Blood Pressure (Exit) 110/70       Heart Rate (Admit) 64 bpm       Heart Rate (Exercise) 80 bpm       Heart Rate (Exit) 60 bpm       Oxygen Saturation (Admit) 96 %       Oxygen Saturation (Exercise) 99 %       Oxygen Saturation (Exit) 99 %       Rating of Perceived Exertion (Exercise) 9          Exercise Comments:   Exercise Goals and Review:   Exercise Goals     Row Name 01/26/24 1437             Exercise Goals   Increase Physical Activity Yes       Intervention Provide advice, education, support and counseling about physical activity/exercise needs.;Develop an individualized exercise prescription for aerobic and resistive training based on initial evaluation findings, risk stratification, comorbidities and participant's personal goals.       Expected Outcomes Short Term: Attend rehab on a regular basis to increase amount of physical activity.;Long Term: Add in home exercise to make exercise part of routine and to increase amount of physical activity.;Long Term: Exercising regularly at least 3-5 days a week.       Increase Strength and  Stamina Yes       Intervention Provide advice, education, support and counseling about physical activity/exercise needs.;Develop an individualized exercise prescription for aerobic and resistive training based on initial evaluation findings, risk stratification, comorbidities and participant's personal goals.       Expected Outcomes Short Term: Increase workloads from initial exercise prescription for resistance, speed, and METs.;Short Term: Perform resistance training exercises routinely during rehab and add in resistance training at home;Long Term: Improve cardiorespiratory fitness, muscular endurance and strength as measured by increased METs and functional capacity ( )       Able to understand and use rate of perceived exertion (RPE) scale Yes       Intervention Provide education and explanation on how to use RPE scale       Expected Outcomes Short Term: Able to use RPE daily in rehab to express subjective intensity level;Long Term:  Able to use RPE to guide intensity level when exercising independently       Able to understand and use Dyspnea scale Yes       Intervention Provide education and explanation on how  to use Dyspnea scale       Expected Outcomes Short Term: Able to use Dyspnea scale daily in rehab to express subjective sense of shortness of breath during exertion;Long Term: Able to use Dyspnea scale to guide intensity level when exercising independently       Knowledge and understanding of Target Heart Rate Range (THRR) Yes       Intervention Provide education and explanation of THRR including how the numbers were predicted and where they are located for reference       Expected Outcomes Short Term: Able to state/look up THRR;Long Term: Able to use THRR to govern intensity when exercising independently;Short Term: Able to use daily as guideline for intensity in rehab       Able to check pulse independently Yes       Intervention Provide education and demonstration on how to check pulse  in carotid and radial arteries.;Review the importance of being able to check your own pulse for safety during independent exercise       Expected Outcomes Short Term: Able to explain why pulse checking is important during independent exercise;Long Term: Able to check pulse independently and accurately       Understanding of Exercise Prescription Yes       Intervention Provide education, explanation, and written materials on patient's individual exercise prescription       Expected Outcomes Short Term: Able to explain program exercise prescription;Long Term: Able to explain home exercise prescription to exercise independently          Exercise Goals Re-Evaluation :    Discharge Exercise Prescription (Final Exercise Prescription Changes):  Exercise Prescription Changes - 01/26/24 1400       Response to Exercise   Blood Pressure (Admit) 92/60    Blood Pressure (Exercise) 134/84    Blood Pressure (Exit) 110/70    Heart Rate (Admit) 64 bpm    Heart Rate (Exercise) 80 bpm    Heart Rate (Exit) 60 bpm    Oxygen Saturation (Admit) 96 %    Oxygen Saturation (Exercise) 99 %    Oxygen Saturation (Exit) 99 %    Rating of Perceived Exertion (Exercise) 9          Nutrition:  Target Goals: Understanding of nutrition guidelines, daily intake of sodium 1500mg , cholesterol 200mg , calories 30% from fat and 7% or less from saturated fats, daily to have 5 or more servings of fruits and vegetables.  Biometrics:  Pre Biometrics - 01/26/24 1438       Pre Biometrics   Height 6' 3 (1.905 m)    Weight 217 lb 6 oz (98.6 kg)    Waist Circumference 35 inches    Hip Circumference 42 inches    Waist to Hip Ratio 0.83 %    BMI (Calculated) 27.17    Grip Strength 38.6 kg    Single Leg Stand 44 seconds           Nutrition Therapy Plan and Nutrition Goals:  Nutrition Therapy & Goals - 01/26/24 1334       Intervention Plan   Intervention Nutrition handout(s) given to patient.;Prescribe,  educate and counsel regarding individualized specific dietary modifications aiming towards targeted core components such as weight, hypertension, lipid management, diabetes, heart failure and other comorbidities.    Expected Outcomes Short Term Goal: Understand basic principles of dietary content, such as calories, fat, sodium, cholesterol and nutrients.;Long Term Goal: Adherence to prescribed nutrition plan.  Nutrition Assessments:  MEDIFICTS Score Key: >=70 Need to make dietary changes  40-70 Heart Healthy Diet <= 40 Therapeutic Level Cholesterol Diet  Flowsheet Row CARDIAC REHAB PHASE II ORIENTATION from 01/26/2024 in [facility] CARDIAC RE[facility]ILITATION  Picture Your Plate Total Score on Admission 56   Picture Your Plate Scores: <59 Unhealthy dietary pattern with much room for improvement. 41-50 Dietary pattern unlikely to meet recommendations for good health and room for improvement. 51-60 More healthful dietary pattern, with some room for improvement.  >60 Healthy dietary pattern, although there may be some specific behaviors that could be improved.    Nutrition Goals Re-Evaluation:   Nutrition Goals Discharge (Final Nutrition Goals Re-Evaluation):   Psychosocial: Target Goals: Acknowledge presence or absence of significant depression and/or stress, maximize coping skills, provide positive support system. Participant is able to verbalize types and ability to use techniques and skills needed for reducing stress and depression.  Initial Review & Psychosocial Screening:  Initial Psych Review & Screening - 01/25/24 1148       Initial Review   Current issues with Current Psychotropic Meds;Current Sleep Concerns      Family Dynamics   Good Support System? Yes    Comments Patient's mother and daugther support him.      Barriers   Psychosocial barriers to participate in program The patient should benefit from training in stress management and relaxation.;There are no  identifiable barriers or psychosocial needs.      Screening Interventions   Interventions Encouraged to exercise;Provide feedback about the scores to participant;To provide support and resources with identified psychosocial needs    Expected Outcomes Short Term goal: Identification and review with participant of any Quality of Life or Depression concerns found by scoring the questionnaire.;Long Term goal: The participant improves quality of Life and PHQ9 Scores as seen by post scores and/or verbalization of changes;Short Term goal: Utilizing psychosocial counselor, staff and physician to assist with identification of specific Stressors or current issues interfering with healing process. Setting desired goal for each stressor or current issue identified.;Long Term Goal: Stressors or current issues are controlled or eliminated.          Quality of Life Scores:  Scores of 19 and below usually indicate a poorer quality of life in these areas.  A difference of  2-3 points is a clinically meaningful difference.  A difference of 2-3 points in the total score of the Quality of Life Index has been associated with significant improvement in overall quality of life, self-image, physical symptoms, and general health in studies assessing change in quality of life.  PHQ-9: Review Flowsheet  More data exists      01/26/2024 01/19/2024 01/08/2024 12/16/2023 08/17/2023  Depression screen PHQ 2/9  Decreased Interest 0 0 0 0 0  Down, Depressed, Hopeless 0 0 0 0 0  PHQ - 2 Score 0 0 0 0 0  Altered sleeping 0 - - 2 -  Tired, decreased energy 2 - - 0 -  Change in appetite 0 - - 0 -  Feeling bad or failure about yourself  0 - - 0 -  Trouble concentrating 0 - - 0 -  Moving slowly or fidgety/restless 0 - - 0 -  Suicidal thoughts 0 - - 0 -  PHQ-9 Score 2 - - 2 -  Difficult doing work/chores Not difficult at all - - Not difficult at all -   Interpretation of Total Score  Total Score Depression Severity:  1-4 =  Minimal depression, 5-9 =  Mild depression, 10-14 = Moderate depression, 15-19 = Moderately severe depression, 20-27 = Severe depression   Psychosocial Evaluation and Intervention:  Psychosocial Evaluation - 01/25/24 1149       Psychosocial Evaluation & Interventions   Interventions Stress management education;Relaxation education;Encouraged to exercise with the program and follow exercise prescription    Comments Patient referred to CR with chronic systolic HF. He denies any depression/anxiety but says he has trouble sleeping with panic feelings. He in on vistaril  prn. He is currenlty living with his mother to help her out. He is disabled. His goals for the program are to get his heart stronger and to be able to do more activities especially spend time with his 2 grandchildren. He has no barriers identified to complete the program.    Expected Outcomes Short Term: Patient will start the program and attend consistently. Long Term: Patient will complete the program meeting personal goals.    Continue Psychosocial Services  Follow up required by staff          Psychosocial Re-Evaluation:   Psychosocial Discharge (Final Psychosocial Re-Evaluation):   Vocational Rehabilitation: Provide vocational rehab assistance to qualifying candidates.   Vocational Rehab Evaluation & Intervention:  Vocational Rehab - 01/25/24 1147       Initial Vocational Rehab Evaluation & Intervention   Assessment shows need for Vocational Rehabilitation No      Vocational Rehab Re-Evaulation   Comments Disabled.          Education: Education Goals: Education classes will be provided on a weekly basis, covering required topics. Participant will state understanding/return demonstration of topics presented.  Learning Barriers/Preferences:  Learning Barriers/Preferences - 01/25/24 1147       Learning Barriers/Preferences   Learning Barriers None    Learning Preferences Audio;Written Material;Skilled  Demonstration          Education Topics: Hypertension, Hypertension Reduction -Define heart disease and high blood pressure. Discus how high blood pressure affects the body and ways to reduce high blood pressure.   Exercise and Your Heart -Discuss why it is important to exercise, the FITT principles of exercise, normal and abnormal responses to exercise, and how to exercise safely.   Angina -Discuss definition of angina, causes of angina, treatment of angina, and how to decrease risk of having angina.   Cardiac Medications -Review what the following cardiac medications are used for, how they affect the body, and side effects that may occur when taking the medications.  Medications include Aspirin , Beta blockers, calcium  channel blockers, ACE Inhibitors, angiotensin receptor blockers, diuretics, digoxin , and antihyperlipidemics.   Congestive Heart Failure -Discuss the definition of CHF, how to live with CHF, the signs and symptoms of CHF, and how keep track of weight and sodium intake.   Heart Disease and Intimacy -Discus the effect sexual activity has on the heart, how changes occur during intimacy as we age, and safety during sexual activity.   Smoking Cessation / COPD -Discuss different methods to quit smoking, the health benefits of quitting smoking, and the definition of COPD.   Nutrition I: Fats -Discuss the types of cholesterol, what cholesterol does to the heart, and how cholesterol levels can be controlled.   Nutrition II: Labels -Discuss the different components of food labels and how to read food label   Heart Parts/Heart Disease and PAD -Discuss the anatomy of the heart, the pathway of blood circulation through the heart, and these are affected by heart disease.   Stress I: Signs and Symptoms -Discuss the causes of  stress, how stress may lead to anxiety and depression, and ways to limit stress.   Stress II: Relaxation -Discuss different types of  relaxation techniques to limit stress.   Warning Signs of Stroke / TIA -Discuss definition of a stroke, what the signs and symptoms are of a stroke, and how to identify when someone is having stroke.   Knowledge Questionnaire Score:   Core Components/Risk Factors/Patient Goals at Admission:  Personal Goals and Risk Factors at Admission - 01/26/24 1358       Core Components/Risk Factors/Patient Goals on Admission    Weight Management Weight Maintenance;Yes    Intervention Weight Management: Develop a combined nutrition and exercise program designed to reach desired caloric intake, while maintaining appropriate intake of nutrient and fiber, sodium and fats, and appropriate energy expenditure required for the weight goal.;Weight Management: Provide education and appropriate resources to help participant work on and attain dietary goals.    Expected Outcomes Short Term: Continue to assess and modify interventions until short term weight is achieved;Long Term: Adherence to nutrition and physical activity/exercise program aimed toward attainment of established weight goal;Weight Maintenance: Understanding of the daily nutrition guidelines, which includes 25-35% calories from fat, 7% or less cal from saturated fats, less than 200mg  cholesterol, less than 1.5gm of sodium, & 5 or more servings of fruits and vegetables daily    Improve shortness of breath with ADL's Yes    Intervention Provide education, individualized exercise plan and daily activity instruction to help decrease symptoms of SOB with activities of daily living.    Expected Outcomes Short Term: Improve cardiorespiratory fitness to achieve a reduction of symptoms when performing ADLs;Long Term: Be able to perform more ADLs without symptoms or delay the onset of symptoms    Diabetes Yes    Intervention Provide education about proper nutrition, including hydration, and aerobic/resistive exercise prescription along with prescribed medications  to achieve blood glucose in normal ranges: Fasting glucose 65-99 mg/dL;Provide education about signs/symptoms and action to take for hypo/hyperglycemia.    Expected Outcomes Short Term: Participant verbalizes understanding of the signs/symptoms and immediate care of hyper/hypoglycemia, proper foot care and importance of medication, aerobic/resistive exercise and nutrition plan for blood glucose control.;Long Term: Attainment of HbA1C < 7%.    Heart Failure Yes    Intervention Provide a combined exercise and nutrition program that is supplemented with education, support and counseling about heart failure. Directed toward relieving symptoms such as shortness of breath, decreased exercise tolerance, and extremity edema.    Expected Outcomes Improve functional capacity of life;Short term: Attendance in program 2-3 days a week with increased exercise capacity. Reported lower sodium intake. Reported increased fruit and vegetable intake. Reports medication compliance.;Short term: Daily weights obtained and reported for increase. Utilizing diuretic protocols set by physician.;Long term: Adoption of self-care skills and reduction of barriers for early signs and symptoms recognition and intervention leading to self-care maintenance.    Hypertension Yes    Intervention Provide education on lifestyle modifcations including regular physical activity/exercise, weight management, moderate sodium restriction and increased consumption of fresh fruit, vegetables, and low fat dairy, alcohol moderation, and smoking cessation.;Monitor prescription use compliance.    Expected Outcomes Short Term: Continued assessment and intervention until BP is < 140/31mm HG in hypertensive participants. < 130/29mm HG in hypertensive participants with diabetes, heart failure or chronic kidney disease.;Long Term: Maintenance of blood pressure at goal levels.    Lipids Yes    Intervention Provide education and support for participant on nutrition  & aerobic/resistive  exercise along with prescribed medications to achieve LDL 70mg , HDL >40mg .    Expected Outcomes Short Term: Participant states understanding of desired cholesterol values and is compliant with medications prescribed. Participant is following exercise prescription and nutrition guidelines.;Long Term: Cholesterol controlled with medications as prescribed, with individualized exercise RX and with personalized nutrition plan. Value goals: LDL < 70mg , HDL > 40 mg.          Core Components/Risk Factors/Patient Goals Review:    Core Components/Risk Factors/Patient Goals at Discharge (Final Review):    ITP Comments:  ITP Comments     Row Name 01/25/24 1153 01/26/24 1358 01/27/24 1543       ITP Comments Virtual orientation visit completed for cardiac rehab with chronic HF. On-site orientation visit scheduled for 01/26/24 at 1300. Patient attend orientation today.  Patient is attending Cardiac Rehabilitation Program.  Documentation for diagnosis [facility] in [facility] [place] 12/16/23.  Reviewed medical chart, RPE/RPD, gym safety, and program guidelines.  Patient was fitted to equipment they will be using during rehab.  Patient is scheduled to start exercise on Thursday 01/28/24 at 1500.   Initial ITP created and sent for review and signature by Dr. Dorn Ross, Medical Director for Cardiac Rehabilitation Program. 30 day review completed. ITP sent to Dr. Dorn Ross, Medical Director of Cardiac Rehab. Continue with ITP unless changes are made by physician.  Only attended orientation yesterday        Comments: 30 day review

## 2024-01-28 ENCOUNTER — Ambulatory Visit (HOSPITAL_COMMUNITY)

## 2024-02-02 ENCOUNTER — Ambulatory Visit (HOSPITAL_COMMUNITY)

## 2024-02-04 ENCOUNTER — Ambulatory Visit (HOSPITAL_COMMUNITY)

## 2024-02-09 ENCOUNTER — Ambulatory Visit (HOSPITAL_COMMUNITY)

## 2024-02-11 ENCOUNTER — Ambulatory Visit (HOSPITAL_COMMUNITY)

## 2024-02-16 ENCOUNTER — Ambulatory Visit (HOSPITAL_COMMUNITY)

## 2024-02-16 ENCOUNTER — Encounter (HOSPITAL_COMMUNITY): Admitting: Cardiology

## 2024-02-16 ENCOUNTER — Telehealth (HOSPITAL_COMMUNITY): Payer: Self-pay

## 2024-02-18 ENCOUNTER — Ambulatory Visit (HOSPITAL_COMMUNITY)

## 2024-02-23 ENCOUNTER — Ambulatory Visit (HOSPITAL_COMMUNITY)

## 2024-02-24 ENCOUNTER — Encounter (HOSPITAL_COMMUNITY): Payer: Self-pay | Admitting: *Deleted

## 2024-02-24 DIAGNOSIS — I5022 Chronic systolic (congestive) heart failure: Secondary | ICD-10-CM

## 2024-02-24 NOTE — Progress Notes (Signed)
 Cardiac Individual Treatment Plan  Patient Details  Name: Evan [name] MRN: [id number] Date of Birth: 10-29-1965 Referring Provider:   Flowsheet Row CARDIAC REHAB PHASE II ORIENTATION from 01/26/2024 in [facility] CARDIAC REHABILITATION  Referring Provider Gardenia Led, DO    Initial Encounter Date:  Flowsheet Row CARDIAC REHAB PHASE II ORIENTATION from 01/26/2024 in [place] [facility] CARDIAC REHABILITATION  Date 01/26/24    Visit Diagnosis: Heart failure, chronic systolic (HCC)  Patient's Home Medications on Admission:  Current Outpatient Medications:    acetaminophen  (TYLENOL ) 325 MG tablet, Take 2 tablets (650 mg total) by mouth every 6 (six) hours as needed for mild pain (pain score 1-3) or fever (or Fever >/= 101)., Disp: , Rfl:    albuterol  (VENTOLIN  HFA) 108 (90 Base) MCG/ACT inhaler, Inhale 2 puffs into the lungs as needed for wheezing or shortness of breath., Disp: 18 g, Rfl: 2   apixaban  (ELIQUIS ) 5 MG TABS tablet, Take 1 tablet (5 mg total) by mouth 2 (two) times daily., Disp: 180 tablet, Rfl: 3   atorvastatin  (LIPITOR ) 80 MG tablet, Take 1 tablet (80 mg total) by mouth daily., Disp: 90 tablet, Rfl: 3   BD PEN NEEDLE NANO 2ND GEN 32G X 4 MM MISC, Use daily with insulin  Dx E11.9, Z79.4, Disp: 100 each, Rfl: 3   Blood Glucose Monitoring Suppl (ACCU-CHEK AVIVA PLUS) w/Device KIT, 1 each by Does not apply route 4 (four) times daily., Disp: 1 kit, Rfl: 1   Blood Glucose Monitoring Suppl (ACCU-CHEK AVIVA) device, Test BS BID Dx E11.69, Disp: 1 each, Rfl: 0   carvedilol  (COREG ) 3.125 MG tablet, Take 1 tablet (3.125 mg total) by mouth 2 (two) times daily., Disp: 180 tablet, Rfl: 3   digoxin  (LANOXIN ) 0.125 MG tablet, Take 1 tablet (0.125 mg total) by mouth daily., Disp: 90 tablet, Rfl: 3   empagliflozin  (JARDIANCE ) 25 MG TABS tablet, Take 1 tablet (25 mg total) by mouth daily., Disp: 90 tablet, Rfl: 3   famotidine  (PEPCID ) 20 MG tablet, Take 1 tablet (20 mg total) by mouth 2 (two) times  daily. (Patient not taking: Reported on 01/25/2024), Disp: 180 tablet, Rfl: 3   fenofibrate  (TRICOR ) 145 MG tablet, TAKE 1 TABLET BY MOUTH EVERY DAY, Disp: 90 tablet, Rfl: 3   ferrous sulfate  325 (65 FE) MG EC tablet, Take 1 tablet (325 mg total) by mouth every other day., Disp: 45 tablet, Rfl: 3   fluticasone  (FLONASE ) 50 MCG/ACT nasal spray, Place 1 spray into both nostrils 2 (two) times daily as needed for allergies or rhinitis., Disp: 16 g, Rfl: 6   glucose blood (ACCU-CHEK GUIDE) test strip, Check BS in the morning and at bedtime Dx E11.8, Disp: 200 strip, Rfl: 3   hydrOXYzine  (VISTARIL ) 25 MG capsule, TAKE 1 CAPSULE (25 MG TOTAL) BY MOUTH EVERY 8 (EIGHT) HOURS AS NEEDED., Disp: 30 capsule, Rfl: 1   insulin  glargine (LANTUS  SOLOSTAR) 100 UNIT/ML Solostar Pen, Inject 10 Units into the skin daily., Disp: 9 mL, Rfl: 3   pantoprazole  (PROTONIX ) 40 MG tablet, Take 40 mg by mouth daily., Disp: , Rfl:    potassium chloride  SA (KLOR-CON  M) 20 MEQ tablet, TAKE 3 TABLETS BY MOUTH DAILY, Disp: 270 tablet, Rfl: 1   spironolactone  (ALDACTONE ) 25 MG tablet, Take 1 tablet (25 mg total) by mouth daily., Disp: 90 tablet, Rfl: 3   torsemide  (DEMADEX ) 20 MG tablet, TAKE 2 TABLETS BY MOUTH 2 TIMES DAILY., Disp: 360 tablet, Rfl: 3  Past Medical History: Past Medical History:  Diagnosis Date   Arthritis    Atrial fibrillation (HCC)    CHF (congestive heart failure) (HCC)    Diabetes mellitus without complication (HCC)    Gout    Heart attack (HCC) 10/2018   Hyperlipidemia    Hypertension    Morbid obesity (HCC) 12/22/2016   Sleep apnea    had test twice unable to complete    Tobacco Use: Social History   Tobacco Use  Smoking Status Never  Smokeless Tobacco Never    Labs: Review Flowsheet  More data exists      Latest Ref Rng & Units 06/01/2023 06/02/2023 06/03/2023 08/17/2023 12/16/2023  Labs for ITP Cardiac and Pulmonary Rehab  Cholestrol 100 - 199 mg/dL - - - 895  869   LDL (calc) 0 - 99 mg/dL - -  - 56  74   HDL-C >60 mg/dL - - - 25  37   Trlycerides 0 - 149 mg/dL - - - 875  896   Hemoglobin A1c 4.8 - 5.6 % - - - 7.8  6.6   O2 Saturation % 71.5  50.2  51.2  56.5  - -    Details       Multiple values from one day are sorted in reverse-chronological order         Capillary Blood Glucose: Lab Results  Component Value Date   GLUCAP 132 (H) 06/03/2023   GLUCAP 142 (H) 06/03/2023   GLUCAP 127 (H) 06/02/2023   GLUCAP 185 (H) 06/02/2023   GLUCAP 182 (H) 06/02/2023     Exercise Target Goals: Exercise Program Goal: Individual exercise prescription set using results from initial 6 min walk test and THRR while considering  patient's activity barriers and safety.   Exercise Prescription Goal: Starting with aerobic activity 30 plus minutes a day, 3 days per week for initial exercise prescription. Provide home exercise prescription and guidelines that participant acknowledges understanding prior to discharge.  Activity Barriers & Risk Stratification:  Activity Barriers & Cardiac Risk Stratification - 01/26/24 1400       Activity Barriers & Cardiac Risk Stratification   Activity Barriers Arthritis;Shortness of Breath;Back Problems;Joint Problems;Deconditioning;Muscular Weakness   chronic back and shoulder pain         6 Minute Walk:  6 Minute Walk     Row Name 01/26/24 1431         6 Minute Walk   Phase Initial     Distance 1028 feet     Walk Time 6 minutes     MPH 1.95     METS 3.05     RPE 9     VO2 Peak 10.67     Symptoms No     Resting HR 64 bpm     Resting BP 92/60     Resting Oxygen Saturation  96 %     Exercise Oxygen Saturation  during 6 min walk 99 %     Max Ex. HR 80 bpm     Max Ex. BP 134/84     2 Minute Post BP 110/70        Oxygen Initial Assessment:   Oxygen Re-Evaluation:   Oxygen Discharge (Final Oxygen Re-Evaluation):   Initial Exercise Prescription:  Initial Exercise Prescription - 01/26/24 1400       Date of Initial Exercise  RX and Referring Provider   Date 01/26/24    Referring Provider Sabharwal, Aditya, DO      NuStep   Level 3    SPM 50  Minutes 15    METs 2      REL-XR   Level 2    Speed 50    Minutes 15    METs 2      Prescription Details   Frequency (times per week) 2    Duration Progress to 30 minutes of continuous aerobic without signs/symptoms of physical distress      Intensity   THRR 40-80% of Max Heartrate 103-142    Ratings of Perceived Exertion 11-13    Perceived Dyspnea 0-4      Resistance Training   Training Prescription Yes    Weight 5    Reps 10-15          Perform Capillary Blood Glucose checks as needed.  Exercise Prescription Changes:   Exercise Prescription Changes     Row Name 01/26/24 1400             Response to Exercise   Blood Pressure (Admit) 92/60       Blood Pressure (Exercise) 134/84       Blood Pressure (Exit) 110/70       Heart Rate (Admit) 64 bpm       Heart Rate (Exercise) 80 bpm       Heart Rate (Exit) 60 bpm       Oxygen Saturation (Admit) 96 %       Oxygen Saturation (Exercise) 99 %       Oxygen Saturation (Exit) 99 %       Rating of Perceived Exertion (Exercise) 9          Exercise Comments:   Exercise Goals and Review:   Exercise Goals     Row Name 01/26/24 1437             Exercise Goals   Increase Physical Activity Yes       Intervention Provide advice, education, support and counseling about physical activity/exercise needs.;Develop an individualized exercise prescription for aerobic and resistive training based on initial evaluation findings, risk stratification, comorbidities and participant's personal goals.       Expected Outcomes Short Term: Attend rehab on a regular basis to increase amount of physical activity.;Long Term: Add in home exercise to make exercise part of routine and to increase amount of physical activity.;Long Term: Exercising regularly at least 3-5 days a week.       Increase Strength and  Stamina Yes       Intervention Provide advice, education, support and counseling about physical activity/exercise needs.;Develop an individualized exercise prescription for aerobic and resistive training based on initial evaluation findings, risk stratification, comorbidities and participant's personal goals.       Expected Outcomes Short Term: Increase workloads from initial exercise prescription for resistance, speed, and METs.;Short Term: Perform resistance training exercises routinely during rehab and add in resistance training at home;Long Term: Improve cardiorespiratory fitness, muscular endurance and strength as measured by increased METs and functional capacity ( )       Able to understand and use rate of perceived exertion (RPE) scale Yes       Intervention Provide education and explanation on how to use RPE scale       Expected Outcomes Short Term: Able to use RPE daily in rehab to express subjective intensity level;Long Term:  Able to use RPE to guide intensity level when exercising independently       Able to understand and use Dyspnea scale Yes       Intervention Provide education and explanation on how  to use Dyspnea scale       Expected Outcomes Short Term: Able to use Dyspnea scale daily in rehab to express subjective sense of shortness of breath during exertion;Long Term: Able to use Dyspnea scale to guide intensity level when exercising independently       Knowledge and understanding of Target Heart Rate Range (THRR) Yes       Intervention Provide education and explanation of THRR including how the numbers were predicted and where they are located for reference       Expected Outcomes Short Term: Able to state/look up THRR;Long Term: Able to use THRR to govern intensity when exercising independently;Short Term: Able to use daily as guideline for intensity in rehab       Able to check pulse independently Yes       Intervention Provide education and demonstration on how to check pulse  in carotid and radial arteries.;Review the importance of being able to check your own pulse for safety during independent exercise       Expected Outcomes Short Term: Able to explain why pulse checking is important during independent exercise;Long Term: Able to check pulse independently and accurately       Understanding of Exercise Prescription Yes       Intervention Provide education, explanation, and written materials on patient's individual exercise prescription       Expected Outcomes Short Term: Able to explain program exercise prescription;Long Term: Able to explain home exercise prescription to exercise independently          Exercise Goals Re-Evaluation :    Discharge Exercise Prescription (Final Exercise Prescription Changes):  Exercise Prescription Changes - 01/26/24 1400       Response to Exercise   Blood Pressure (Admit) 92/60    Blood Pressure (Exercise) 134/84    Blood Pressure (Exit) 110/70    Heart Rate (Admit) 64 bpm    Heart Rate (Exercise) 80 bpm    Heart Rate (Exit) 60 bpm    Oxygen Saturation (Admit) 96 %    Oxygen Saturation (Exercise) 99 %    Oxygen Saturation (Exit) 99 %    Rating of Perceived Exertion (Exercise) 9          Nutrition:  Target Goals: Understanding of nutrition guidelines, daily intake of sodium 1500mg , cholesterol 200mg , calories 30% from fat and 7% or less from saturated fats, daily to have 5 or more servings of fruits and vegetables.  Biometrics:  Pre Biometrics - 01/26/24 1438       Pre Biometrics   Height 6' 3 (1.905 m)    Weight 217 lb 6 oz (98.6 kg)    Waist Circumference 35 inches    Hip Circumference 42 inches    Waist to Hip Ratio 0.83 %    BMI (Calculated) 27.17    Grip Strength 38.6 kg    Single Leg Stand 44 seconds           Nutrition Therapy Plan and Nutrition Goals:  Nutrition Therapy & Goals - 01/26/24 1334       Intervention Plan   Intervention Nutrition handout(s) given to patient.;Prescribe,  educate and counsel regarding individualized specific dietary modifications aiming towards targeted core components such as weight, hypertension, lipid management, diabetes, heart failure and other comorbidities.    Expected Outcomes Short Term Goal: Understand basic principles of dietary content, such as calories, fat, sodium, cholesterol and nutrients.;Long Term Goal: Adherence to prescribed nutrition plan.  Nutrition Assessments:  MEDIFICTS Score Key: >=70 Need to make dietary changes  40-70 Heart Healthy Diet <= 40 Therapeutic Level Cholesterol Diet  Flowsheet Row CARDIAC REHAB PHASE II ORIENTATION from 01/26/2024 in [facility] CARDIAC REHABILITATION  Picture Your Plate Total Score on Admission 56   Picture Your Plate Scores: <59 Unhealthy dietary pattern with much room for improvement. 41-50 Dietary pattern unlikely to meet recommendations for good health and room for improvement. 51-60 More healthful dietary pattern, with some room for improvement.  >60 Healthy dietary pattern, although there may be some specific behaviors that could be improved.    Nutrition Goals Re-Evaluation:   Nutrition Goals Discharge (Final Nutrition Goals Re-Evaluation):   Psychosocial: Target Goals: Acknowledge presence or absence of significant depression and/or stress, maximize coping skills, provide positive support system. Participant is able to verbalize types and ability to use techniques and skills needed for reducing stress and depression.  Initial Review & Psychosocial Screening:  Initial Psych Review & Screening - 01/25/24 1148       Initial Review   Current issues with Current Psychotropic Meds;Current Sleep Concerns      Family Dynamics   Good Support System? Yes    Comments Patient's mother and daugther support him.      Barriers   Psychosocial barriers to participate in program The patient should benefit from training in stress management and relaxation.;There are no  identifiable barriers or psychosocial needs.      Screening Interventions   Interventions Encouraged to exercise;Provide feedback about the scores to participant;To provide support and resources with identified psychosocial needs    Expected Outcomes Short Term goal: Identification and review with participant of any Quality of Life or Depression concerns found by scoring the questionnaire.;Long Term goal: The participant improves quality of Life and PHQ9 Scores as seen by post scores and/or verbalization of changes;Short Term goal: Utilizing psychosocial counselor, staff and physician to assist with identification of specific Stressors or current issues interfering with healing process. Setting desired goal for each stressor or current issue identified.;Long Term Goal: Stressors or current issues are controlled or eliminated.          Quality of Life Scores:  Scores of 19 and below usually indicate a poorer quality of life in these areas.  A difference of  2-3 points is a clinically meaningful difference.  A difference of 2-3 points in the total score of the Quality of Life Index has been associated with significant improvement in overall quality of life, self-image, physical symptoms, and general health in studies assessing change in quality of life.  PHQ-9: Review Flowsheet  More data exists      01/26/2024 01/19/2024 01/08/2024 12/16/2023 08/17/2023  Depression screen PHQ 2/9  Decreased Interest 0 0 0 0 0  Down, Depressed, Hopeless 0 0 0 0 0  PHQ - 2 Score 0 0 0 0 0  Altered sleeping 0 - - 2 -  Tired, decreased energy 2 - - 0 -  Change in appetite 0 - - 0 -  Feeling bad or failure about yourself  0 - - 0 -  Trouble concentrating 0 - - 0 -  Moving slowly or fidgety/restless 0 - - 0 -  Suicidal thoughts 0 - - 0 -  PHQ-9 Score 2 - - 2 -  Difficult doing work/chores Not difficult at all - - Not difficult at all -   Interpretation of Total Score  Total Score Depression Severity:  1-4 =  Minimal depression, 5-9 =  Mild depression, 10-14 = Moderate depression, 15-19 = Moderately severe depression, 20-27 = Severe depression   Psychosocial Evaluation and Intervention:  Psychosocial Evaluation - 01/25/24 1149       Psychosocial Evaluation & Interventions   Interventions Stress management education;Relaxation education;Encouraged to exercise with the program and follow exercise prescription    Comments Patient referred to CR with chronic systolic HF. He denies any depression/anxiety but says he has trouble sleeping with panic feelings. He in on vistaril  prn. He is currenlty living with his mother to help her out. He is disabled. His goals for the program are to get his heart stronger and to be able to do more activities especially spend time with his 2 grandchildren. He has no barriers identified to complete the program.    Expected Outcomes Short Term: Patient will start the program and attend consistently. Long Term: Patient will complete the program meeting personal goals.    Continue Psychosocial Services  Follow up required by staff          Psychosocial Re-Evaluation:   Psychosocial Discharge (Final Psychosocial Re-Evaluation):   Vocational Rehabilitation: Provide vocational rehab assistance to qualifying candidates.   Vocational Rehab Evaluation & Intervention:  Vocational Rehab - 01/25/24 1147       Initial Vocational Rehab Evaluation & Intervention   Assessment shows need for Vocational Rehabilitation No      Vocational Rehab Re-Evaulation   Comments Disabled.          Education: Education Goals: Education classes will be provided on a weekly basis, covering required topics. Participant will state understanding/return demonstration of topics presented.  Learning Barriers/Preferences:  Learning Barriers/Preferences - 01/25/24 1147       Learning Barriers/Preferences   Learning Barriers None    Learning Preferences Audio;Written Material;Skilled  Demonstration          Education Topics: Hypertension, Hypertension Reduction -Define heart disease and high blood pressure. Discus how high blood pressure affects the body and ways to reduce high blood pressure.   Exercise and Your Heart -Discuss why it is important to exercise, the FITT principles of exercise, normal and abnormal responses to exercise, and how to exercise safely.   Angina -Discuss definition of angina, causes of angina, treatment of angina, and how to decrease risk of having angina.   Cardiac Medications -Review what the following cardiac medications are used for, how they affect the body, and side effects that may occur when taking the medications.  Medications include Aspirin , Beta blockers, calcium  channel blockers, ACE Inhibitors, angiotensin receptor blockers, diuretics, digoxin , and antihyperlipidemics.   Congestive Heart Failure -Discuss the definition of CHF, how to live with CHF, the signs and symptoms of CHF, and how keep track of weight and sodium intake.   Heart Disease and Intimacy -Discus the effect sexual activity has on the heart, how changes occur during intimacy as we age, and safety during sexual activity.   Smoking Cessation / COPD -Discuss different methods to quit smoking, the health benefits of quitting smoking, and the definition of COPD.   Nutrition I: Fats -Discuss the types of cholesterol, what cholesterol does to the heart, and how cholesterol levels can be controlled.   Nutrition II: Labels -Discuss the different components of food labels and how to read food label   Heart Parts/Heart Disease and PAD -Discuss the anatomy of the heart, the pathway of blood circulation through the heart, and these are affected by heart disease.   Stress I: Signs and Symptoms -Discuss the causes of  stress, how stress may lead to anxiety and depression, and ways to limit stress.   Stress II: Relaxation -Discuss different types of  relaxation techniques to limit stress.   Warning Signs of Stroke / TIA -Discuss definition of a stroke, what the signs and symptoms are of a stroke, and how to identify when someone is having stroke.   Knowledge Questionnaire Score:   Core Components/Risk Factors/Patient Goals at Admission:  Personal Goals and Risk Factors at Admission - 01/26/24 1358       Core Components/Risk Factors/Patient Goals on Admission    Weight Management Weight Maintenance;Yes    Intervention Weight Management: Develop a combined nutrition and exercise program designed to reach desired caloric intake, while maintaining appropriate intake of nutrient and fiber, sodium and fats, and appropriate energy expenditure required for the weight goal.;Weight Management: Provide education and appropriate resources to help participant work on and attain dietary goals.    Expected Outcomes Short Term: Continue to assess and modify interventions until short term weight is achieved;Long Term: Adherence to nutrition and physical activity/exercise program aimed toward attainment of established weight goal;Weight Maintenance: Understanding of the daily nutrition guidelines, which includes 25-35% calories from fat, 7% or less cal from saturated fats, less than 200mg  cholesterol, less than 1.5gm of sodium, & 5 or more servings of fruits and vegetables daily    Improve shortness of breath with ADL's Yes    Intervention Provide education, individualized exercise plan and daily activity instruction to help decrease symptoms of SOB with activities of daily living.    Expected Outcomes Short Term: Improve cardiorespiratory fitness to achieve a reduction of symptoms when performing ADLs;Long Term: Be able to perform more ADLs without symptoms or delay the onset of symptoms    Diabetes Yes    Intervention Provide education about proper nutrition, including hydration, and aerobic/resistive exercise prescription along with prescribed medications  to achieve blood glucose in normal ranges: Fasting glucose 65-99 mg/dL;Provide education about signs/symptoms and action to take for hypo/hyperglycemia.    Expected Outcomes Short Term: Participant verbalizes understanding of the signs/symptoms and immediate care of hyper/hypoglycemia, proper foot care and importance of medication, aerobic/resistive exercise and nutrition plan for blood glucose control.;Long Term: Attainment of HbA1C < 7%.    Heart Failure Yes    Intervention Provide a combined exercise and nutrition program that is supplemented with education, support and counseling about heart failure. Directed toward relieving symptoms such as shortness of breath, decreased exercise tolerance, and extremity edema.    Expected Outcomes Improve functional capacity of life;Short term: Attendance in program 2-3 days a week with increased exercise capacity. Reported lower sodium intake. Reported increased fruit and vegetable intake. Reports medication compliance.;Short term: Daily weights obtained and reported for increase. Utilizing diuretic protocols set by physician.;Long term: Adoption of self-care skills and reduction of barriers for early signs and symptoms recognition and intervention leading to self-care maintenance.    Hypertension Yes    Intervention Provide education on lifestyle modifcations including regular physical activity/exercise, weight management, moderate sodium restriction and increased consumption of fresh fruit, vegetables, and low fat dairy, alcohol moderation, and smoking cessation.;Monitor prescription use compliance.    Expected Outcomes Short Term: Continued assessment and intervention until BP is < 140/41mm HG in hypertensive participants. < 130/42mm HG in hypertensive participants with diabetes, heart failure or chronic kidney disease.;Long Term: Maintenance of blood pressure at goal levels.    Lipids Yes    Intervention Provide education and support for participant on nutrition  & aerobic/resistive  exercise along with prescribed medications to achieve LDL 70mg , HDL >40mg .    Expected Outcomes Short Term: Participant states understanding of desired cholesterol values and is compliant with medications prescribed. Participant is following exercise prescription and nutrition guidelines.;Long Term: Cholesterol controlled with medications as prescribed, with individualized exercise RX and with personalized nutrition plan. Value goals: LDL < 70mg , HDL > 40 mg.          Core Components/Risk Factors/Patient Goals Review:    Core Components/Risk Factors/Patient Goals at Discharge (Final Review):    ITP Comments:  ITP Comments     Row Name 01/25/24 1153 01/26/24 1358 01/27/24 1543 02/24/24 1818     ITP Comments Virtual orientation visit completed for cardiac rehab with chronic HF. On-site orientation visit scheduled for 01/26/24 at 1300. Patient attend orientation today.  Patient is attending Cardiac Rehabilitation Program.  Documentation for diagnosis can be found in [place] [facility].  Reviewed medical chart, RPE/RPD, gym safety, and program guidelines.  Patient was fitted to equipment they will be using during rehab.  Patient is scheduled to start exercise on Thursday 01/28/24 at 1500.   Initial ITP created and sent for review and signature by Dr. Dorn Ross, Medical Director for Cardiac Rehabilitation Program. 30 day review completed. ITP sent to Dr. Dorn Ross, Medical Director of Cardiac Rehab. Continue with ITP unless changes are made by physician.  Only attended orientation yesterday 30 day review completed. ITP sent to Dr. Dorn Ross, Medical Director of Cardiac Rehab. Continue with ITP unless changes are made by physician.  Only attended orientation as he has yet to show up for exercise.       Comments: 30 day review

## 2024-02-25 ENCOUNTER — Ambulatory Visit (HOSPITAL_COMMUNITY)

## 2024-03-01 ENCOUNTER — Ambulatory Visit (HOSPITAL_COMMUNITY)

## 2024-03-03 ENCOUNTER — Telehealth (HOSPITAL_COMMUNITY): Payer: Self-pay | Admitting: Surgery

## 2024-03-03 ENCOUNTER — Ambulatory Visit (HOSPITAL_COMMUNITY)

## 2024-03-03 NOTE — Telephone Encounter (Signed)
 I called the pt since he missed his cardiac rehab session today.  I left the pt a voicemail asking him to call our office back.

## 2024-03-07 ENCOUNTER — Encounter: Payer: Self-pay | Admitting: *Deleted

## 2024-03-08 ENCOUNTER — Ambulatory Visit (HOSPITAL_COMMUNITY)

## 2024-03-10 ENCOUNTER — Ambulatory Visit (HOSPITAL_COMMUNITY)

## 2024-03-14 ENCOUNTER — Telehealth (HOSPITAL_COMMUNITY): Payer: Self-pay | Admitting: Cardiology

## 2024-03-14 NOTE — Telephone Encounter (Signed)
 Called to confirm/remind patient of their appointment at [facility] on 03/14/[facility]24.   Appointment:   [] Confirmed  [x] Left mess   [] No answer/No voice mail  [] VM Full/unable to leave message  [] Phone not in service  Patient reminded to bring all medications and/or complete list.  Confirmed patient has transportation. Gave directions, instructed to utilize valet parking.

## 2024-03-15 ENCOUNTER — Encounter (HOSPITAL_COMMUNITY): Admitting: Cardiology

## 2024-03-15 ENCOUNTER — Ambulatory Visit (HOSPITAL_COMMUNITY)

## 2024-03-17 ENCOUNTER — Ambulatory Visit (HOSPITAL_COMMUNITY)

## 2024-03-17 ENCOUNTER — Ambulatory Visit: Admitting: Family Medicine

## 2024-03-21 ENCOUNTER — Encounter: Payer: Self-pay | Admitting: Radiology

## 2024-03-22 ENCOUNTER — Ambulatory Visit (HOSPITAL_COMMUNITY)

## 2024-03-23 ENCOUNTER — Encounter (HOSPITAL_COMMUNITY): Payer: Self-pay

## 2024-03-23 DIAGNOSIS — I5022 Chronic systolic (congestive) heart failure: Secondary | ICD-10-CM

## 2024-03-23 NOTE — Progress Notes (Signed)
 Cardiac Individual Treatment Plan  Patient Details  Name: Evan [name] MRN: [id number] Date of Birth: Oct 24, 1965 Referring Provider:   Flowsheet Row CARDIAC REHAB PHASE II ORIENTATION from 01/26/2024 in [facility]  Referring Provider Gardenia Led, DO    Initial Encounter Date:  Flowsheet Row CARDIAC REHAB PHASE II ORIENTATION from 01/26/2024 in [facility]  Date 01/26/24    Visit Diagnosis: Heart failure, chronic systolic (HCC)  Patient's Home Medications on Admission:  Current Outpatient Medications:    acetaminophen  (TYLENOL ) 325 MG tablet, Take 2 tablets (650 mg total) by mouth every 6 (six) hours as needed for mild pain (pain score 1-3) or fever (or Fever >/= 101)., Disp: , Rfl:    albuterol  (VENTOLIN  HFA) 108 (90 Base) MCG/ACT inhaler, Inhale 2 puffs into the lungs as needed for wheezing or shortness of breath., Disp: 18 g, Rfl: 2   apixaban  (ELIQUIS ) 5 MG TABS tablet, Take 1 tablet (5 mg total) by mouth 2 (two) times daily., Disp: 180 tablet, Rfl: 3   atorvastatin  (LIPITOR ) 80 MG tablet, Take 1 tablet (80 mg total) by mouth daily., Disp: 90 tablet, Rfl: 3   BD PEN NEEDLE NANO 2ND GEN 32G X 4 MM MISC, Use daily with insulin  Dx E11.9, Z79.4, Disp: 100 each, Rfl: 3   Blood Glucose Monitoring Suppl (ACCU-CHEK AVIVA PLUS) w/Device KIT, 1 each by Does not apply route 4 (four) times daily., Disp: 1 kit, Rfl: 1   Blood Glucose Monitoring Suppl (ACCU-CHEK AVIVA) device, Test BS BID Dx E11.69, Disp: 1 each, Rfl: 0   carvedilol  (COREG ) 3.125 MG tablet, Take 1 tablet (3.125 mg total) by mouth 2 (two) times daily., Disp: 180 tablet, Rfl: 3   digoxin  (LANOXIN ) 0.125 MG tablet, Take 1 tablet (0.125 mg total) by mouth daily., Disp: 90 tablet, Rfl: 3   empagliflozin  (JARDIANCE ) 25 MG TABS tablet, Take 1 tablet (25 mg total) by mouth daily., Disp: 90 tablet, Rfl: 3   famotidine  (PEPCID ) 20 MG tablet, Take 1 tablet (20 mg total) by mouth 2 (two) times  daily. (Patient not taking: Reported on 01/25/2024), Disp: 180 tablet, Rfl: 3   fenofibrate  (TRICOR ) 145 MG tablet, TAKE 1 TABLET BY MOUTH EVERY DAY, Disp: 90 tablet, Rfl: 3   ferrous sulfate  325 (65 FE) MG EC tablet, Take 1 tablet (325 mg total) by mouth every other day., Disp: 45 tablet, Rfl: 3   fluticasone  (FLONASE ) 50 MCG/ACT nasal spray, Place 1 spray into both nostrils 2 (two) times daily as needed for allergies or rhinitis., Disp: 16 g, Rfl: 6   glucose blood (ACCU-CHEK GUIDE) test strip, Check BS in the morning and at bedtime Dx E11.8, Disp: 200 strip, Rfl: 3   hydrOXYzine  (VISTARIL ) 25 MG capsule, TAKE 1 CAPSULE (25 MG TOTAL) BY MOUTH EVERY 8 (EIGHT) HOURS AS NEEDED., Disp: 30 capsule, Rfl: 1   insulin  glargine (LANTUS  SOLOSTAR) 100 UNIT/ML Solostar Pen, Inject 10 Units into the skin daily., Disp: 9 mL, Rfl: 3   pantoprazole  (PROTONIX ) 40 MG tablet, Take 40 mg by mouth daily., Disp: , Rfl:    potassium chloride  SA (KLOR-CON  M) 20 MEQ tablet, TAKE 3 TABLETS BY MOUTH DAILY, Disp: 270 tablet, Rfl: 1   spironolactone  (ALDACTONE ) 25 MG tablet, Take 1 tablet (25 mg total) by mouth daily., Disp: 90 tablet, Rfl: 3   torsemide  (DEMADEX ) 20 MG tablet, TAKE 2 TABLETS BY MOUTH 2 TIMES DAILY., Disp: 360 tablet, Rfl: 3  Past Medical History: Past Medical History:  Diagnosis Date   Arthritis    Atrial fibrillation (HCC)    CHF (congestive heart failure) (HCC)    Diabetes mellitus without complication (HCC)    Gout    Heart attack (HCC) 10/2018   Hyperlipidemia    Hypertension    Morbid obesity (HCC) 12/22/2016   Sleep apnea    had test twice unable to complete    Tobacco Use: Social History   Tobacco Use  Smoking Status Never  Smokeless Tobacco Never    Labs: Review Flowsheet  More data exists      Latest Ref Rng & Units 06/01/2023 06/02/2023 06/03/2023 08/17/2023 12/16/2023  Labs for ITP Cardiac and Pulmonary Rehab  Cholestrol 100 - 199 mg/dL - - - 895  869   LDL (calc) 0 - 99 mg/dL - -  - 56  74   HDL-C >60 mg/dL - - - 25  37   Trlycerides 0 - 149 mg/dL - - - 875  896   Hemoglobin A1c 4.8 - 5.6 % - - - 7.8  6.6   O2 Saturation % 71.5  50.2  51.2  56.5  - -    Details       Multiple values from one day are sorted in reverse-chronological order          Exercise Target Goals: Exercise Program Goal: Individual exercise prescription set using results from initial 6 min walk test and THRR while considering  patient's activity barriers and safety.   Exercise Prescription Goal: Initial exercise prescription builds to 30-45 minutes a day of aerobic activity, 2-3 days per week.  Home exercise guidelines will be given to patient during program as part of exercise prescription that the participant will acknowledge.   Education: Aerobic Exercise: - Group verbal and visual presentation on the components of exercise prescription. Introduces F.I.T.T principle from ACSM for exercise prescriptions.  Reviews F.I.T.T. principles of aerobic exercise including progression. Written material provided at class time.   Education: Resistance Exercise: - Group verbal and visual presentation on the components of exercise prescription. Introduces F.I.T.T principle from ACSM for exercise prescriptions  Reviews F.I.T.T. principles of resistance exercise including progression. Written material provided at class time.    Education: Exercise & Equipment Safety: - Individual verbal instruction and demonstration of equipment use and safety with use of the equipment.   Education: Exercise Physiology & General Exercise Guidelines: - Group verbal and written instruction with models to review the exercise physiology of the cardiovascular system and associated critical values. Provides general exercise guidelines with specific guidelines to those with heart or lung disease. Written material provided at class time.   Education: Flexibility, Balance, Mind/Body Relaxation: - Group verbal and visual  presentation with interactive activity on the components of exercise prescription. Introduces F.I.T.T principle from ACSM for exercise prescriptions. Reviews F.I.T.T. principles of flexibility and balance exercise training including progression. Also discusses the mind body connection.  Reviews various relaxation techniques to help reduce and manage stress (i.e. Deep breathing, progressive muscle relaxation, and visualization). Balance handout provided to take home. Written material provided at class time.   Activity Barriers & Risk Stratification:  Activity Barriers & Cardiac Risk Stratification - 01/26/24 1400       Activity Barriers & Cardiac Risk Stratification   Activity Barriers Arthritis;Shortness of Breath;Back Problems;Joint Problems;Deconditioning;Muscular Weakness   chronic back and shoulder pain         6 Minute Walk:  6 Minute Walk     Row Name 01/26/24 1431  6 Minute Walk   Phase Initial     Distance 1028 feet     Walk Time 6 minutes     MPH 1.95     METS 3.05     RPE 9     VO2 Peak 10.67     Symptoms No     Resting HR 64 bpm     Resting BP 92/60     Resting Oxygen Saturation  96 %     Exercise Oxygen Saturation  during 6 min walk 99 %     Max Ex. HR 80 bpm     Max Ex. BP 134/84     2 Minute Post BP 110/70        Oxygen Initial Assessment:   Oxygen Re-Evaluation:   Oxygen Discharge (Final Oxygen Re-Evaluation):   Initial Exercise Prescription:  Initial Exercise Prescription - 01/26/24 1400       Date of Initial Exercise RX and Referring Provider   Date 01/26/24    Referring Provider Sabharwal, Aditya, DO      NuStep   Level 3    SPM 50    Minutes 15    METs 2      REL-XR   Level 2    Speed 50    Minutes 15    METs 2      Prescription Details   Frequency (times per week) 2    Duration Progress to 30 minutes of continuous aerobic without signs/symptoms of physical distress      Intensity   THRR 40-80% of Max Heartrate  103-142    Ratings of Perceived Exertion 11-13    Perceived Dyspnea 0-4      Resistance Training   Training Prescription Yes    Weight 5    Reps 10-15          Perform Capillary Blood Glucose checks as needed.  Exercise Prescription Changes:   Exercise Prescription Changes     Row Name 01/26/24 1400             Response to Exercise   Blood Pressure (Admit) 92/60       Blood Pressure (Exercise) 134/84       Blood Pressure (Exit) 110/70       Heart Rate (Admit) 64 bpm       Heart Rate (Exercise) 80 bpm       Heart Rate (Exit) 60 bpm       Oxygen Saturation (Admit) 96 %       Oxygen Saturation (Exercise) 99 %       Oxygen Saturation (Exit) 99 %       Rating of Perceived Exertion (Exercise) 9          Exercise Comments:   Exercise Goals and Review:   Exercise Goals     Row Name 01/26/24 1437             Exercise Goals   Increase Physical Activity Yes       Intervention Provide advice, education, support and counseling about physical activity/exercise needs.;Develop an individualized exercise prescription for aerobic and resistive training based on initial evaluation findings, risk stratification, comorbidities and participant's personal goals.       Expected Outcomes Short Term: Attend rehab on a regular basis to increase amount of physical activity.;Long Term: Add in home exercise to make exercise part of routine and to increase amount of physical activity.;Long Term: Exercising regularly at least 3-5 days a week.  Increase Strength and Stamina Yes       Intervention Provide advice, education, support and counseling about physical activity/exercise needs.;Develop an individualized exercise prescription for aerobic and resistive training based on initial evaluation findings, risk stratification, comorbidities and participant's personal goals.       Expected Outcomes Short Term: Increase workloads from initial exercise prescription for resistance, speed, and  METs.;Short Term: Perform resistance training exercises routinely during rehab and add in resistance training at home;Long Term: Improve cardiorespiratory fitness, muscular endurance and strength as measured by increased METs and functional capacity ( )       Able to understand and use rate of perceived exertion (RPE) scale Yes       Intervention Provide education and explanation on how to use RPE scale       Expected Outcomes Short Term: Able to use RPE daily in rehab to express subjective intensity level;Long Term:  Able to use RPE to guide intensity level when exercising independently       Able to understand and use Dyspnea scale Yes       Intervention Provide education and explanation on how to use Dyspnea scale       Expected Outcomes Short Term: Able to use Dyspnea scale daily in rehab to express subjective sense of shortness of breath during exertion;Long Term: Able to use Dyspnea scale to guide intensity level when exercising independently       Knowledge and understanding of Target Heart Rate Range (THRR) Yes       Intervention Provide education and explanation of THRR including how the numbers were predicted and where they are located for reference       Expected Outcomes Short Term: Able to state/look up THRR;Long Term: Able to use THRR to govern intensity when exercising independently;Short Term: Able to use daily as guideline for intensity in rehab       Able to check pulse independently Yes       Intervention Provide education and demonstration on how to check pulse in carotid and radial arteries.;Review the importance of being able to check your own pulse for safety during independent exercise       Expected Outcomes Short Term: Able to explain why pulse checking is important during independent exercise;Long Term: Able to check pulse independently and accurately       Understanding of Exercise Prescription Yes       Intervention Provide education, explanation, and written materials  on patient's individual exercise prescription       Expected Outcomes Short Term: Able to explain program exercise prescription;Long Term: Able to explain home exercise prescription to exercise independently          Exercise Goals Re-Evaluation :   Discharge Exercise Prescription (Final Exercise Prescription Changes):  Exercise Prescription Changes - 01/26/24 1400       Response to Exercise   Blood Pressure (Admit) 92/60    Blood Pressure (Exercise) 134/84    Blood Pressure (Exit) 110/70    Heart Rate (Admit) 64 bpm    Heart Rate (Exercise) 80 bpm    Heart Rate (Exit) 60 bpm    Oxygen Saturation (Admit) 96 %    Oxygen Saturation (Exercise) 99 %    Oxygen Saturation (Exit) 99 %    Rating of Perceived Exertion (Exercise) 9          Nutrition:  Target Goals: Understanding of nutrition guidelines, daily intake of sodium 1500mg , cholesterol 200mg , calories 30% from fat and 7% or less from saturated  fats, daily to have 5 or more servings of fruits and vegetables.  Education: Nutrition 1 -Group instruction provided by verbal, written material, interactive activities, discussions, models, and posters to present general guidelines for heart healthy nutrition including macronutrients, label reading, and promoting whole foods over processed counterparts. Education serves as pensions consultant of discussion of heart healthy eating for all. Written material provided at class time.    Education: Nutrition 2 -Group instruction provided by verbal, written material, interactive activities, discussions, models, and posters to present general guidelines for heart healthy nutrition including sodium, cholesterol, and saturated fat. Providing guidance of habit forming to improve blood pressure, cholesterol, and body weight. Written material provided at class time.     Biometrics:  Pre Biometrics - 01/26/24 1438       Pre Biometrics   Height 6' 3 (1.905 m)    Weight 217 lb 6 oz (98.6 kg)     Waist Circumference 35 inches    Hip Circumference 42 inches    Waist to Hip Ratio 0.83 %    BMI (Calculated) 27.17    Grip Strength 38.6 kg    Single Leg Stand 44 seconds           Nutrition Therapy Plan and Nutrition Goals:  Nutrition Therapy & Goals - 01/26/24 1334       Intervention Plan   Intervention Nutrition handout(s) given to patient.;Prescribe, educate and counsel regarding individualized specific dietary modifications aiming towards targeted core components such as weight, hypertension, lipid management, diabetes, heart failure and other comorbidities.    Expected Outcomes Short Term Goal: Understand basic principles of dietary content, such as calories, fat, sodium, cholesterol and nutrients.;Long Term Goal: Adherence to prescribed nutrition plan.          Nutrition Assessments:  MEDIFICTS Score Key: >=70 Need to make dietary changes  40-70 Heart Healthy Diet <= 40 Therapeutic Level Cholesterol Diet  Flowsheet Row CARDIAC REHAB PHASE II ORIENTATION from 01/26/2024 in [facility] C[facility]AC [facility]  Picture Your Plate Total Score on Admission 56   Picture Your Plate Scores: <59 Unhealthy dietary pattern with much room for improvement. 41-50 Dietary pattern unlikely to meet recommendations for good health and room for improvement. 51-60 More healthful dietary pattern, with some room for improvement.  >60 Healthy dietary pattern, although there may be some specific behaviors that could be improved.    Nutrition Goals Re-Evaluation:   Nutrition Goals Discharge (Final Nutrition Goals Re-Evaluation):   Psychosocial: Target Goals: Acknowledge presence or absence of significant depression and/or stress, maximize coping skills, provide positive support system. Participant is able to verbalize types and ability to use techniques and skills needed for reducing stress and depression.   Education: Stress, Anxiety, and Depression - Group verbal and visual  presentation to define topics covered.  Reviews how body is impacted by stress, anxiety, and depression.  Also discusses healthy ways to reduce stress and to treat/manage anxiety and depression. Written material provided at class time.   Education: Sleep Hygiene -Provides group verbal and written instruction about how sleep can affect your health.  Define sleep hygiene, discuss sleep cycles and impact of sleep habits. Review good sleep hygiene tips.   Initial Review & Psychosocial Screening:  Initial Psych Review & Screening - 01/25/24 1148       Initial Review   Current issues with Current Psychotropic Meds;Current Sleep Concerns      Family Dynamics   Good Support System? Yes    Comments Patient's mother and daugther support him.  Barriers   Psychosocial barriers to participate in program The patient should benefit from training in stress management and relaxation.;There are no identifiable barriers or psychosocial needs.      Screening Interventions   Interventions Encouraged to exercise;Provide feedback about the scores to participant;To provide support and resources with identified psychosocial needs    Expected Outcomes Short Term goal: Identification and review with participant of any Quality of Life or Depression concerns found by scoring the questionnaire.;Long Term goal: The participant improves quality of Life and PHQ9 Scores as seen by post scores and/or verbalization of changes;Short Term goal: Utilizing psychosocial counselor, staff and physician to assist with identification of specific Stressors or current issues interfering with healing process. Setting desired goal for each stressor or current issue identified.;Long Term Goal: Stressors or current issues are controlled or eliminated.          Quality of Life Scores:   Scores of 19 and below usually indicate a poorer quality of life in these areas.  A difference of  2-3 points is a clinically meaningful difference.   A difference of 2-3 points in the total score of the Quality of Life Index has been associated with significant improvement in overall quality of life, self-image, physical symptoms, and general health in studies assessing change in quality of life.  PHQ-9: Review Flowsheet  More data exists      01/26/2024 01/19/2024 01/08/2024 12/16/2023 08/17/2023  Depression screen PHQ 2/9  Decreased Interest 0 0 0 0 0  Down, Depressed, Hopeless 0 0 0 0 0  PHQ - 2 Score 0 0 0 0 0  Altered sleeping 0 - - 2 -  Tired, decreased energy 2 - - 0 -  Change in appetite 0 - - 0 -  Feeling bad or failure about yourself  0 - - 0 -  Trouble concentrating 0 - - 0 -  Moving slowly or fidgety/restless 0 - - 0 -  Suicidal thoughts 0 - - 0 -  PHQ-9 Score 2 - - 2 -  Difficult doing work/chores Not difficult at all - - Not difficult at all -   Interpretation of Total Score  Total Score Depression Severity:  1-4 = Minimal depression, 5-9 = Mild depression, 10-14 = Moderate depression, 15-19 = Moderately severe depression, 20-27 = Severe depression   Psychosocial Evaluation and Intervention:  Psychosocial Evaluation - 01/25/24 1149       Psychosocial Evaluation & Interventions   Interventions Stress management education;Relaxation education;Encouraged to exercise with the program and follow exercise prescription    Comments Patient referred to CR with chronic systolic HF. He denies any depression/anxiety but says he has trouble sleeping with panic feelings. He in on vistaril  prn. He is currenlty living with his mother to help her out. He is disabled. His goals for the program are to get his heart stronger and to be able to do more activities especially spend time with his 2 grandchildren. He has no barriers identified to complete the program.    Expected Outcomes Short Term: Patient will start the program and attend consistently. Long Term: Patient will complete the program meeting personal goals.    Continue  Psychosocial Services  Follow up required by staff          Psychosocial Re-Evaluation:   Psychosocial Discharge (Final Psychosocial Re-Evaluation):   Vocational Rehabilitation: Provide vocational rehab assistance to qualifying candidates.   Vocational Rehab Evaluation & Intervention:  Vocational Rehab - 01/25/24 1147  Initial Vocational Rehab Evaluation & Intervention   Assessment shows need for Vocational Rehabilitation No      Vocational Rehab Re-Evaulation   Comments Disabled.          Education: Education Goals: Education classes will be provided on a variety of topics geared toward better understanding of heart health and risk factor modification. Participant will state understanding/return demonstration of topics presented as noted by education test scores.  Learning Barriers/Preferences:  Learning Barriers/Preferences - 01/25/24 1147       Learning Barriers/Preferences   Learning Barriers None    Learning Preferences Audio;Written Material;Skilled Demonstration          General Cardiac Education Topics:  AED/CPR: - Group verbal and written instruction with the use of models to demonstrate the basic use of the AED with the basic ABC's of resuscitation.   Test and Procedures: - Group verbal and visual presentation and models provide information about basic cardiac anatomy and function. Reviews the testing methods done to diagnose heart disease and the outcomes of the test results. Describes the treatment choices: Medical Management, Angioplasty, or Coronary Bypass Surgery for treating various heart conditions including Myocardial Infarction, Angina, Valve Disease, and Cardiac Arrhythmias. Written material provided at class time.   Medication Safety: - Group verbal and visual instruction to review commonly prescribed medications for heart and lung disease. Reviews the medication, class of the drug, and side effects. Includes the steps to properly store  meds and maintain the prescription regimen. Written material provided at class time.   Intimacy: - Group verbal instruction through game format to discuss how heart and lung disease can affect sexual intimacy. Written material provided at class time.   Know Your Numbers and Heart Failure: - Group verbal and visual instruction to discuss disease risk factors for cardiac and pulmonary disease and treatment options.  Reviews associated critical values for Overweight/Obesity, Hypertension, Cholesterol, and Diabetes.  Discusses basics of heart failure: signs/symptoms and treatments.  Introduces Heart Failure Zone chart for action plan for heart failure. Written material provided at class time.   Infection Prevention: - Provides verbal and written material to individual with discussion of infection control including proper hand washing and proper equipment cleaning during exercise session.   Falls Prevention: - Provides verbal and written material to individual with discussion of falls prevention and safety.   Other: -Provides group and verbal instruction on various topics (see comments)   Knowledge Questionnaire Score:   Core Components/Risk Factors/Patient Goals at Admission:  Personal Goals and Risk Factors at Admission - 01/26/24 1358       Core Components/Risk Factors/Patient Goals on Admission    Weight Management Weight Maintenance;Yes    Intervention Weight Management: Develop a combined nutrition and exercise program designed to reach desired caloric intake, while maintaining appropriate intake of nutrient and fiber, sodium and fats, and appropriate energy expenditure required for the weight goal.;Weight Management: Provide education and appropriate resources to help participant work on and attain dietary goals.    Expected Outcomes Short Term: Continue to assess and modify interventions until short term weight is achieved;Long Term: Adherence to nutrition and physical  activity/exercise program aimed toward attainment of established weight goal;Weight Maintenance: Understanding of the daily nutrition guidelines, which includes 25-35% calories from fat, 7% or less cal from saturated fats, less than 200mg  cholesterol, less than 1.5gm of sodium, & 5 or more servings of fruits and vegetables daily    Improve shortness of breath with ADL's Yes    Intervention Provide education,  individualized exercise plan and daily activity instruction to help decrease symptoms of SOB with activities of daily living.    Expected Outcomes Short Term: Improve cardiorespiratory fitness to achieve a reduction of symptoms when performing ADLs;Long Term: Be able to perform more ADLs without symptoms or delay the onset of symptoms    Diabetes Yes    Intervention Provide education about proper nutrition, including hydration, and aerobic/resistive exercise prescription along with prescribed medications to achieve blood glucose in normal ranges: Fasting glucose 65-99 mg/dL;Provide education about signs/symptoms and action to take for hypo/hyperglycemia.    Expected Outcomes Short Term: Participant verbalizes understanding of the signs/symptoms and immediate care of hyper/hypoglycemia, proper foot care and importance of medication, aerobic/resistive exercise and nutrition plan for blood glucose control.;Long Term: Attainment of HbA1C < 7%.    Heart Failure Yes    Intervention Provide a combined exercise and nutrition program that is supplemented with education, support and counseling about heart failure. Directed toward relieving symptoms such as shortness of breath, decreased exercise tolerance, and extremity edema.    Expected Outcomes Improve functional capacity of life;Short term: Attendance in program 2-3 days a week with increased exercise capacity. Reported lower sodium intake. Reported increased fruit and vegetable intake. Reports medication compliance.;Short term: Daily weights obtained and  reported for increase. Utilizing diuretic protocols set by physician.;Long term: Adoption of self-care skills and reduction of barriers for early signs and symptoms recognition and intervention leading to self-care maintenance.    Hypertension Yes    Intervention Provide education on lifestyle modifcations including regular physical activity/exercise, weight management, moderate sodium restriction and increased consumption of fresh fruit, vegetables, and low fat dairy, alcohol moderation, and smoking cessation.;Monitor prescription use compliance.    Expected Outcomes Short Term: Continued assessment and intervention until BP is < 140/54mm HG in hypertensive participants. < 130/20mm HG in hypertensive participants with diabetes, heart failure or chronic kidney disease.;Long Term: Maintenance of blood pressure at goal levels.    Lipids Yes    Intervention Provide education and support for participant on nutrition & aerobic/resistive exercise along with prescribed medications to achieve LDL 70mg , HDL >40mg .    Expected Outcomes Short Term: Participant states understanding of desired cholesterol values and is compliant with medications prescribed. Participant is following exercise prescription and nutrition guidelines.;Long Term: Cholesterol controlled with medications as prescribed, with individualized exercise RX and with personalized nutrition plan. Value goals: LDL < 70mg , HDL > 40 mg.          Education:Diabetes - Individual verbal and written instruction to review signs/symptoms of diabetes, desired ranges of glucose level fasting, after meals and with exercise. Acknowledge that pre and post exercise glucose checks will be done for 3 sessions at entry of program.   Core Components/Risk Factors/Patient Goals Review:    Core Components/Risk Factors/Patient Goals at Discharge (Final Review):    ITP Comments:  ITP Comments     Row Name 01/25/24 1153 01/26/24 1358 01/27/24 1543 02/24/24 1818  03/23/24 0926   ITP Comments Virtual orientation visit completed for cardiac rehab with chronic HF. On-site orientation visit scheduled for 01/26/24 at 1300. Patient attend orientation today.  Patient is attending Cardiac Rehabilitation Program.  Documentation for diagnosis can be found in [facility] 12/16/23.  Reviewed medical chart, RPE/RPD, gym safety, and program guidelines.  Patient was fitted to equipment they will be using during rehab.  Patient is scheduled to start exercise on Thursday 01/28/24 at 1500.   Initial ITP created and sent for review and signature by Dr.  Dorn Ross, Medical Director for Cardiac Rehabilitation Program. 30 day review completed. ITP sent to Dr. Dorn Ross, Medical Director of Cardiac Rehab. Continue with ITP unless changes are made by physician.  Only attended orientation yesterday 30 day review completed. ITP sent to Dr. Dorn Ross, Medical Director of Cardiac Rehab. Continue with ITP unless changes are made by physician.  Only attended orientation as he has yet to show up for exercise. 30 day review completed. ITP sent to Dr. Dorn Ross, Medical Director of Cardiac Rehab. Continue with ITP unless changes are made by physician.  Only attended orientation as he has yet to show up for exercise.      Comments: 30 day review

## 2024-03-24 ENCOUNTER — Ambulatory Visit (HOSPITAL_COMMUNITY)

## 2024-03-24 ENCOUNTER — Encounter (HOSPITAL_COMMUNITY): Payer: Self-pay

## 2024-03-24 DIAGNOSIS — I5022 Chronic systolic (congestive) heart failure: Secondary | ICD-10-CM

## 2024-03-24 NOTE — Progress Notes (Signed)
 Discharge Progress Report  Patient Details  Name: Evan [name] MRN: [id number] Date of Birth: 1965/09/03 Referring Provider:   Flowsheet Row CARDIAC REHAB PHASE II ORIENTATION from 01/26/2024 in [facility] CARD[facility]AC REHABILITATION  Referring Provider Gardenia Led, DO     Number of Visits: 0  Reason for Discharge:  Early Exit:  Lack of attendance and never started the program  Smoking History:  Social History   Tobacco Use  Smoking Status Never  Smokeless Tobacco Never    Diagnosis:  Heart failure, chronic systolic (HCC)  ADL [facility]:   Initial Exercise Prescription:  Initial Exercise Prescription - 01/26/24 1400       Date of Initial Exercise RX and Referring Provider   Date 01/26/24    Referring Provider Sabharwal, Aditya, DO      NuStep   Level 3    SPM 50    Minutes 15    METs 2      REL-XR   Level 2    Speed 50    Minutes 15    METs 2      Prescription Details   Frequency (times per week) 2    Duration Progress to 30 minutes of continuous aerobic without signs/symptoms of physical distress      Intensity   THRR 40-80% of Max Heartrate 103-142    Ratings of Perceived Exertion 11-13    Perceived Dyspnea 0-4      Resistance Training   Training Prescription Yes    Weight 5    Reps 10-15          Discharge Exercise Prescription (Final Exercise Prescription Changes):  Exercise Prescription Changes - 01/26/24 1400       Response to Exercise   Blood Pressure (Admit) 92/60    Blood Pressure (Exercise) 134/84    Blood Pressure (Exit) 110/70    Heart Rate (Admit) 64 bpm    Heart Rate (Exercise) 80 bpm    Heart Rate (Exit) 60 bpm    Oxygen Saturation (Admit) 96 %    Oxygen Saturation (Exercise) 99 %    Oxygen Saturation (Exit) 99 %    Rating of Perceived Exertion (Exercise) 9          Functional Capacity:  6 Minute Walk     Row Name 01/26/24 1431         6 Minute Walk   Phase Initial     Distance 1028 feet     Walk Time 6  minutes     MPH 1.95     METS 3.05     RPE 9     VO2 Peak 10.67     Symptoms No     Resting HR 64 bpm     Resting BP 92/60     Resting Oxygen Saturation  96 %     Exercise Oxygen Saturation  during 6 min walk 99 %     Max Ex. HR 80 bpm     Max Ex. BP 134/84     2 Minute Post BP 110/70        Psychological, QOL, Others - Outcomes: PHQ 2/9:    01/26/2024    1:21 PM 01/19/2024   10:00 AM 01/08/2024    9:26 AM 12/16/2023    2:27 PM 08/17/2023    3:52 PM  Depression screen PHQ 2/9  Decreased Interest 0 0 0 0 0  Down, Depressed, Hopeless 0 0 0 0 0  PHQ - 2 Score 0 0 0 0  0  Altered sleeping 0   2   Tired, decreased energy 2   0   Change in appetite 0   0   Feeling bad or failure about yourself  0   0   Trouble concentrating 0   0   Moving slowly or fidgety/restless 0   0   Suicidal thoughts 0   0   PHQ-9 Score 2   2   Difficult doing work/chores Not difficult at all   Not difficult at all     Quality of Life:   Personal Goals: Goals established at orientation with interventions provided to work toward goal.  Personal Goals and Risk Factors at Admission - 01/26/24 1358       Core Components/Risk Factors/Patient Goals on Admission    Weight Management Weight Maintenance;Yes    Intervention Weight Management: Develop a combined nutrition and exercise program designed to reach desired caloric intake, while maintaining appropriate intake of nutrient and fiber, sodium and fats, and appropriate energy expenditure required for the weight goal.;Weight Management: Provide education and appropriate resources to help participant work on and attain dietary goals.    Expected Outcomes Short Term: Continue to assess and modify interventions until short term weight is achieved;Long Term: Adherence to nutrition and physical activity/exercise program aimed toward attainment of established weight goal;Weight Maintenance: Understanding of the daily nutrition guidelines, which includes 25-35%  calories from fat, 7% or less cal from saturated fats, less than 200mg  cholesterol, less than 1.5gm of sodium, & 5 or more servings of fruits and vegetables daily    Improve shortness of breath with ADL's Yes    Intervention Provide education, individualized exercise plan and daily activity instruction to help decrease symptoms of SOB with activities of daily living.    Expected Outcomes Short Term: Improve cardiorespiratory fitness to achieve a reduction of symptoms when performing ADLs;Long Term: Be able to perform more ADLs without symptoms or delay the onset of symptoms    Diabetes Yes    Intervention Provide education about proper nutrition, including hydration, and aerobic/resistive exercise prescription along with prescribed medications to achieve blood glucose in normal ranges: Fasting glucose 65-99 mg/dL;Provide education about signs/symptoms and action to take for hypo/hyperglycemia.    Expected Outcomes Short Term: Participant verbalizes understanding of the signs/symptoms and immediate care of hyper/hypoglycemia, proper foot care and importance of medication, aerobic/resistive exercise and nutrition plan for blood glucose control.;Long Term: Attainment of HbA1C < 7%.    Heart Failure Yes    Intervention Provide a combined exercise and nutrition program that is supplemented with education, support and counseling about heart failure. Directed toward relieving symptoms such as shortness of breath, decreased exercise tolerance, and extremity edema.    Expected Outcomes Improve functional capacity of life;Short term: Attendance in program 2-3 days a week with increased exercise capacity. Reported lower sodium intake. Reported increased fruit and vegetable intake. Reports medication compliance.;Short term: Daily weights obtained and reported for increase. Utilizing diuretic protocols set by physician.;Long term: Adoption of self-care skills and reduction of barriers for early signs and symptoms  recognition and intervention leading to self-care maintenance.    Hypertension Yes    Intervention Provide education on lifestyle modifcations including regular physical activity/exercise, weight management, moderate sodium restriction and increased consumption of fresh fruit, vegetables, and low fat dairy, alcohol moderation, and smoking cessation.;Monitor prescription use compliance.    Expected Outcomes Short Term: Continued assessment and intervention until BP is < 140/44mm HG in hypertensive participants. < 130/69mm HG in  hypertensive participants with diabetes, heart failure or chronic kidney disease.;Long Term: Maintenance of blood pressure at goal levels.    Lipids Yes    Intervention Provide education and support for participant on nutrition & aerobic/resistive exercise along with prescribed medications to achieve LDL 70mg , HDL >40mg .    Expected Outcomes Short Term: Participant states understanding of desired cholesterol values and is compliant with medications prescribed. Participant is following exercise prescription and nutrition guidelines.;Long Term: Cholesterol controlled with medications as prescribed, with individualized exercise RX and with personalized nutrition plan. Value goals: LDL < 70mg , HDL > 40 mg.           Personal Goals Discharge:   Exercise Goals and Review:  Exercise Goals     Row Name 01/26/24 1437             Exercise Goals   Increase Physical Activity Yes       Intervention Provide advice, education, support and counseling about physical activity/exercise needs.;Develop an individualized exercise prescription for aerobic and resistive training based on initial evaluation findings, risk stratification, comorbidities and participant's personal goals.       Expected Outcomes Short Term: Attend rehab on a regular basis to increase amount of physical activity.;Long Term: Add in home exercise to make exercise part of routine and to increase amount of physical  activity.;Long Term: Exercising regularly at least 3-5 days a week.       Increase Strength and Stamina Yes       Intervention Provide advice, education, support and counseling about physical activity/exercise needs.;Develop an individualized exercise prescription for aerobic and resistive training based on initial evaluation findings, risk stratification, comorbidities and participant's personal goals.       Expected Outcomes Short Term: Increase workloads from initial exercise prescription for resistance, speed, and METs.;Short Term: Perform resistance training exercises routinely during rehab and add in resistance training at home;Long Term: Improve cardiorespiratory fitness, muscular endurance and strength as measured by increased METs and functional capacity ( )       Able to understand and use rate of perceived exertion (RPE) scale Yes       Intervention Provide education and explanation on how to use RPE scale       Expected Outcomes Short Term: Able to use RPE daily in rehab to express subjective intensity level;Long Term:  Able to use RPE to guide intensity level when exercising independently       Able to understand and use Dyspnea scale Yes       Intervention Provide education and explanation on how to use Dyspnea scale       Expected Outcomes Short Term: Able to use Dyspnea scale daily in rehab to express subjective sense of shortness of breath during exertion;Long Term: Able to use Dyspnea scale to guide intensity level when exercising independently       Knowledge and understanding of Target Heart Rate Range (THRR) Yes       Intervention Provide education and explanation of THRR including how the numbers were predicted and where they are located for reference       Expected Outcomes Short Term: Able to state/look up THRR;Long Term: Able to use THRR to govern intensity when exercising independently;Short Term: Able to use daily as guideline for intensity in rehab       Able to check  pulse independently Yes       Intervention Provide education and demonstration on how to check pulse in carotid and radial arteries.;Review the importance of being  able to check your own pulse for safety during independent exercise       Expected Outcomes Short Term: Able to explain why pulse checking is important during independent exercise;Long Term: Able to check pulse independently and accurately       Understanding of Exercise Prescription Yes       Intervention Provide education, explanation, and written materials on patient's individual exercise prescription       Expected Outcomes Short Term: Able to explain program exercise prescription;Long Term: Able to explain home exercise prescription to exercise independently          Exercise Goals Re-Evaluation:   Nutrition & Weight - Outcomes:  Pre Biometrics - 01/26/24 1438       Pre Biometrics   Height 6' 3 (1.905 m)    Weight 217 lb 6 oz (98.6 kg)    Waist Circumference 35 inches    Hip Circumference 42 inches    Waist to Hip Ratio 0.83 %    BMI (Calculated) 27.17    Grip Strength 38.6 kg    Single Leg Stand 44 seconds           Nutrition:  Nutrition Therapy & Goals - 01/26/24 1334       Intervention Plan   Intervention Nutrition handout(s) given to patient.;Prescribe, educate and counsel regarding individualized specific dietary modifications aiming towards targeted core components such as weight, hypertension, lipid management, diabetes, heart failure and other comorbidities.    Expected Outcomes Short Term Goal: Understand basic principles of dietary content, such as calories, fat, sodium, cholesterol and nutrients.;Long Term Goal: Adherence to prescribed nutrition plan.          Nutrition Discharge:   Education Questionnaire Score:   Goals reviewed with patient; copy given to patient.

## 2024-03-24 NOTE — Progress Notes (Signed)
 Cardiac Individual Treatment Plan  Patient Details  Name: Evan [name] MRN: [id number] Date of Birth: 01-14-1966 Referring Provider:   Flowsheet Row CARDIAC REHAB PHASE II ORIENTATION from 01/26/2024 in [facility] CARDIAC REHABILITATION  Referring Provider Gardenia Led, DO    Initial Encounter Date:  Flowsheet Row CARDIAC REHAB PHASE II ORIENTATION from 01/26/2024 in [place] CARDIAC REHABILITATION  Date 01/26/24    Visit Diagnosis: Heart failure, chronic systolic (HCC)  Patient's Home Medications on Admission:  Current Outpatient Medications:    acetaminophen  (TYLENOL ) 325 MG tablet, Take 2 tablets (650 mg total) by mouth every 6 (six) hours as needed for mild pain (pain score 1-3) or fever (or Fever >/= 101)., Disp: , Rfl:    albuterol  (VENTOLIN  HFA) 108 (90 Base) MCG/ACT inhaler, Inhale 2 puffs into the lungs as needed for wheezing or shortness of breath., Disp: 18 g, Rfl: 2   apixaban  (ELIQUIS ) 5 MG TABS tablet, Take 1 tablet (5 mg total) by mouth 2 (two) times daily., Disp: 180 tablet, Rfl: 3   atorvastatin  (LIPITOR ) 80 MG tablet, Take 1 tablet (80 mg total) by mouth daily., Disp: 90 tablet, Rfl: 3   BD PEN NEEDLE NANO 2ND GEN 32G X 4 MM MISC, Use daily with insulin  Dx E11.9, Z79.4, Disp: 100 each, Rfl: 3   Blood Glucose Monitoring Suppl (ACCU-CHEK AVIVA PLUS) w/Device KIT, 1 each by Does not apply route 4 (four) times daily., Disp: 1 kit, Rfl: 1   Blood Glucose Monitoring Suppl (ACCU-CHEK AVIVA) device, Test BS BID Dx E11.69, Disp: 1 each, Rfl: 0   carvedilol  (COREG ) 3.125 MG tablet, Take 1 tablet (3.125 mg total) by mouth 2 (two) times daily., Disp: 180 tablet, Rfl: 3   digoxin  (LANOXIN ) 0.125 MG tablet, Take 1 tablet (0.125 mg total) by mouth daily., Disp: 90 tablet, Rfl: 3   empagliflozin  (JARDIANCE ) 25 MG TABS tablet, Take 1 tablet (25 mg total) by mouth daily., Disp: 90 tablet, Rfl: 3   famotidine  (PEPCID ) 20 MG tablet, Take 1 tablet (20 mg total) by mouth 2 (two) times  daily. (Patient not taking: Reported on 01/25/2024), Disp: 180 tablet, Rfl: 3   fenofibrate  (TRICOR ) 145 MG tablet, TAKE 1 TABLET BY MOUTH EVERY DAY, Disp: 90 tablet, Rfl: 3   ferrous sulfate  325 (65 FE) MG EC tablet, Take 1 tablet (325 mg total) by mouth every other day., Disp: 45 tablet, Rfl: 3   fluticasone  (FLONASE ) 50 MCG/ACT nasal spray, Place 1 spray into both nostrils 2 (two) times daily as needed for allergies or rhinitis., Disp: 16 g, Rfl: 6   glucose blood (ACCU-CHEK GUIDE) test strip, Check BS in the morning and at bedtime Dx E11.8, Disp: 200 strip, Rfl: 3   hydrOXYzine  (VISTARIL ) 25 MG capsule, TAKE 1 CAPSULE (25 MG TOTAL) BY MOUTH EVERY 8 (EIGHT) HOURS AS NEEDED., Disp: 30 capsule, Rfl: 1   insulin  glargine (LANTUS  SOLOSTAR) 100 UNIT/ML Solostar Pen, Inject 10 Units into the skin daily., Disp: 9 mL, Rfl: 3   pantoprazole  (PROTONIX ) 40 MG tablet, Take 40 mg by mouth daily., Disp: , Rfl:    potassium chloride  SA (KLOR-CON  M) 20 MEQ tablet, TAKE 3 TABLETS BY MOUTH DAILY, Disp: 270 tablet, Rfl: 1   spironolactone  (ALDACTONE ) 25 MG tablet, Take 1 tablet (25 mg total) by mouth daily., Disp: 90 tablet, Rfl: 3   torsemide  (DEMADEX ) 20 MG tablet, TAKE 2 TABLETS BY MOUTH 2 TIMES DAILY., Disp: 360 tablet, Rfl: 3  Past Medical History: Past Medical History:  Diagnosis Date   Arthritis    Atrial fibrillation (HCC)    CHF (congestive heart failure) (HCC)    Diabetes mellitus without complication (HCC)    Gout    Heart attack (HCC) 10/2018   Hyperlipidemia    Hypertension    Morbid obesity (HCC) 12/22/2016   Sleep apnea    had test twice unable to complete    Tobacco Use: Social History   Tobacco Use  Smoking Status Never  Smokeless Tobacco Never    Labs: Review Flowsheet  More data exists      Latest Ref Rng & Units 06/01/2023 06/02/2023 06/03/2023 08/17/2023 12/16/2023  Labs for ITP Cardiac and Pulmonary Rehab  Cholestrol 100 - 199 mg/dL - - - 895  869   LDL (calc) 0 - 99 mg/dL - -  - 56  74   HDL-C >60 mg/dL - - - 25  37   Trlycerides 0 - 149 mg/dL - - - 875  896   Hemoglobin A1c 4.8 - 5.6 % - - - 7.8  6.6   O2 Saturation % 71.5  50.2  51.2  56.5  - -    Details       Multiple values from one day are sorted in reverse-chronological order          Exercise Target Goals: Exercise Program Goal: Individual exercise prescription set using results from initial 6 min walk test and THRR while considering  patient's activity barriers and safety.   Exercise Prescription Goal: Initial exercise prescription builds to 30-45 minutes a day of aerobic activity, 2-3 days per week.  Home exercise guidelines will be given to patient during program as part of exercise prescription that the participant will acknowledge.   Education: Aerobic Exercise: - Group verbal and visual presentation on the components of exercise prescription. Introduces F.I.T.T principle from ACSM for exercise prescriptions.  Reviews F.I.T.T. principles of aerobic exercise including progression. Written material provided at class time.   Education: Resistance Exercise: - Group verbal and visual presentation on the components of exercise prescription. Introduces F.I.T.T principle from ACSM for exercise prescriptions  Reviews F.I.T.T. principles of resistance exercise including progression. Written material provided at class time.    Education: Exercise & Equipment Safety: - Individual verbal instruction and demonstration of equipment use and safety with use of the equipment.   Education: Exercise Physiology & General Exercise Guidelines: - Group verbal and written instruction with models to review the exercise physiology of the cardiovascular system and associated critical values. Provides general exercise guidelines with specific guidelines to those with heart or lung disease. Written material provided at class time.   Education: Flexibility, Balance, Mind/Body Relaxation: - Group verbal and visual  presentation with interactive activity on the components of exercise prescription. Introduces F.I.T.T principle from ACSM for exercise prescriptions. Reviews F.I.T.T. principles of flexibility and balance exercise training including progression. Also discusses the mind body connection.  Reviews various relaxation techniques to help reduce and manage stress (i.e. Deep breathing, progressive muscle relaxation, and visualization). Balance handout provided to take home. Written material provided at class time.   Activity Barriers & Risk Stratification:  Activity Barriers & Cardiac Risk Stratification - 01/26/24 1400       Activity Barriers & Cardiac Risk Stratification   Activity Barriers Arthritis;Shortness of Breath;Back Problems;Joint Problems;Deconditioning;Muscular Weakness   chronic back and shoulder pain         6 Minute Walk:  6 Minute Walk     Row Name 01/26/24 1431  6 Minute Walk   Phase Initial     Distance 1028 feet     Walk Time 6 minutes     MPH 1.95     METS 3.05     RPE 9     VO2 Peak 10.67     Symptoms No     Resting HR 64 bpm     Resting BP 92/60     Resting Oxygen Saturation  96 %     Exercise Oxygen Saturation  during 6 min walk 99 %     Max Ex. HR 80 bpm     Max Ex. BP 134/84     2 Minute Post BP 110/70        Oxygen Initial Assessment:   Oxygen Re-Evaluation:   Oxygen Discharge (Final Oxygen Re-Evaluation):   Initial Exercise Prescription:  Initial Exercise Prescription - 01/26/24 1400       Date of Initial Exercise RX and Referring Provider   Date 01/26/24    Referring Provider Sabharwal, Aditya, DO      NuStep   Level 3    SPM 50    Minutes 15    METs 2      REL-XR   Level 2    Speed 50    Minutes 15    METs 2      Prescription Details   Frequency (times per week) 2    Duration Progress to 30 minutes of continuous aerobic without signs/symptoms of physical distress      Intensity   THRR 40-80% of Max Heartrate  103-142    Ratings of Perceived Exertion 11-13    Perceived Dyspnea 0-4      Resistance Training   Training Prescription Yes    Weight 5    Reps 10-15          Perform Capillary Blood Glucose checks as needed.  Exercise Prescription Changes:   Exercise Prescription Changes     Row Name 01/26/24 1400             Response to Exercise   Blood Pressure (Admit) 92/60       Blood Pressure (Exercise) 134/84       Blood Pressure (Exit) 110/70       Heart Rate (Admit) 64 bpm       Heart Rate (Exercise) 80 bpm       Heart Rate (Exit) 60 bpm       Oxygen Saturation (Admit) 96 %       Oxygen Saturation (Exercise) 99 %       Oxygen Saturation (Exit) 99 %       Rating of Perceived Exertion (Exercise) 9          Exercise Comments:   Exercise Goals and Review:   Exercise Goals     Row Name 01/26/24 1437             Exercise Goals   Increase Physical Activity Yes       Intervention Provide advice, education, support and counseling about physical activity/exercise needs.;Develop an individualized exercise prescription for aerobic and resistive training based on initial evaluation findings, risk stratification, comorbidities and participant's personal goals.       Expected Outcomes Short Term: Attend rehab on a regular basis to increase amount of physical activity.;Long Term: Add in home exercise to make exercise part of routine and to increase amount of physical activity.;Long Term: Exercising regularly at least 3-5 days a week.  Increase Strength and Stamina Yes       Intervention Provide advice, education, support and counseling about physical activity/exercise needs.;Develop an individualized exercise prescription for aerobic and resistive training based on initial evaluation findings, risk stratification, comorbidities and participant's personal goals.       Expected Outcomes Short Term: Increase workloads from initial exercise prescription for resistance, speed, and  METs.;Short Term: Perform resistance training exercises routinely during rehab and add in resistance training at home;Long Term: Improve cardiorespiratory fitness, muscular endurance and strength as measured by increased METs and functional capacity ( )       Able to understand and use rate of perceived exertion (RPE) scale Yes       Intervention Provide education and explanation on how to use RPE scale       Expected Outcomes Short Term: Able to use RPE daily in rehab to express subjective intensity level;Long Term:  Able to use RPE to guide intensity level when exercising independently       Able to understand and use Dyspnea scale Yes       Intervention Provide education and explanation on how to use Dyspnea scale       Expected Outcomes Short Term: Able to use Dyspnea scale daily in rehab to express subjective sense of shortness of breath during exertion;Long Term: Able to use Dyspnea scale to guide intensity level when exercising independently       Knowledge and understanding of Target Heart Rate Range (THRR) Yes       Intervention Provide education and explanation of THRR including how the numbers were predicted and where they are located for reference       Expected Outcomes Short Term: Able to state/look up THRR;Long Term: Able to use THRR to govern intensity when exercising independently;Short Term: Able to use daily as guideline for intensity in rehab       Able to check pulse independently Yes       Intervention Provide education and demonstration on how to check pulse in carotid and radial arteries.;Review the importance of being able to check your own pulse for safety during independent exercise       Expected Outcomes Short Term: Able to explain why pulse checking is important during independent exercise;Long Term: Able to check pulse independently and accurately       Understanding of Exercise Prescription Yes       Intervention Provide education, explanation, and written materials  on patient's individual exercise prescription       Expected Outcomes Short Term: Able to explain program exercise prescription;Long Term: Able to explain home exercise prescription to exercise independently          Exercise Goals Re-Evaluation :   Discharge Exercise Prescription (Final Exercise Prescription Changes):  Exercise Prescription Changes - 01/26/24 1400       Response to Exercise   Blood Pressure (Admit) 92/60    Blood Pressure (Exercise) 134/84    Blood Pressure (Exit) 110/70    Heart Rate (Admit) 64 bpm    Heart Rate (Exercise) 80 bpm    Heart Rate (Exit) 60 bpm    Oxygen Saturation (Admit) 96 %    Oxygen Saturation (Exercise) 99 %    Oxygen Saturation (Exit) 99 %    Rating of Perceived Exertion (Exercise) 9          Nutrition:  Target Goals: Understanding of nutrition guidelines, daily intake of sodium 1500mg , cholesterol 200mg , calories 30% from fat and 7% or less from saturated  fats, daily to have 5 or more servings of fruits and vegetables.  Education: Nutrition 1 -Group instruction provided by verbal, written material, interactive activities, discussions, models, and posters to present general guidelines for heart healthy nutrition including macronutrients, label reading, and promoting whole foods over processed counterparts. Education serves as pensions consultant of discussion of heart healthy eating for all. Written material provided at class time.    Education: Nutrition 2 -Group instruction provided by verbal, written material, interactive activities, discussions, models, and posters to present general guidelines for heart healthy nutrition including sodium, cholesterol, and saturated fat. Providing guidance of habit forming to improve blood pressure, cholesterol, and body weight. Written material provided at class time.     Biometrics:  Pre Biometrics - 01/26/24 1438       Pre Biometrics   Height 6' 3 (1.905 m)    Weight 217 lb 6 oz (98.6 kg)     Waist Circumference 35 inches    Hip Circumference 42 inches    Waist to Hip Ratio 0.83 %    BMI (Calculated) 27.17    Grip Strength 38.6 kg    Single Leg Stand 44 seconds           Nutrition Therapy Plan and Nutrition Goals:  Nutrition Therapy & Goals - 01/26/24 1334       Intervention Plan   Intervention Nutrition handout(s) given to patient.;Prescribe, educate and counsel regarding individualized specific dietary modifications aiming towards targeted core components such as weight, hypertension, lipid management, diabetes, heart failure and other comorbidities.    Expected Outcomes Short Term Goal: Understand basic principles of dietary content, such as calories, fat, sodium, cholesterol and nutrients.;Long Term Goal: Adherence to prescribed nutrition plan.          Nutrition Assessments:  MEDIFICTS Score Key: >=70 Need to make dietary changes  40-70 Heart Healthy Diet <= 40 Therapeutic Level Cholesterol Diet  Flowsheet Row CARDIAC REHAB PHASE II ORIENTATION from 01/26/2024 in [facility] CARDIAC REHABILITATION  Picture Your Plate Total Score on Admission 56   Picture Your Plate Scores: <59 Unhealthy dietary pattern with much room for improvement. 41-50 Dietary pattern unlikely to meet recommendations for good health and room for improvement. 51-60 More healthful dietary pattern, with some room for improvement.  >60 Healthy dietary pattern, although there may be some specific behaviors that could be improved.    Nutrition Goals Re-Evaluation:   Nutrition Goals Discharge (Final Nutrition Goals Re-Evaluation):   Psychosocial: Target Goals: Acknowledge presence or absence of significant depression and/or stress, maximize coping skills, provide positive support system. Participant is able to verbalize types and ability to use techniques and skills needed for reducing stress and depression.   Education: Stress, Anxiety, and Depression - Group verbal and visual  presentation to define topics covered.  Reviews how body is impacted by stress, anxiety, and depression.  Also discusses healthy ways to reduce stress and to treat/manage anxiety and depression. Written material provided at class time.   Education: Sleep Hygiene -Provides group verbal and written instruction about how sleep can affect your health.  Define sleep hygiene, discuss sleep cycles and impact of sleep habits. Review good sleep hygiene tips.   Initial Review & Psychosocial Screening:  Initial Psych Review & Screening - 01/25/24 1148       Initial Review   Current issues with Current Psychotropic Meds;Current Sleep Concerns      Family Dynamics   Good Support System? Yes    Comments Patient's mother and daugther support him.  Barriers   Psychosocial barriers to participate in program The patient should benefit from training in stress management and relaxation.;There are no identifiable barriers or psychosocial needs.      Screening Interventions   Interventions Encouraged to exercise;Provide feedback about the scores to participant;To provide support and resources with identified psychosocial needs    Expected Outcomes Short Term goal: Identification and review with participant of any Quality of Life or Depression concerns found by scoring the questionnaire.;Long Term goal: The participant improves quality of Life and PHQ9 Scores as seen by post scores and/or verbalization of changes;Short Term goal: Utilizing psychosocial counselor, staff and physician to assist with identification of specific Stressors or current issues interfering with healing process. Setting desired goal for each stressor or current issue identified.;Long Term Goal: Stressors or current issues are controlled or eliminated.          Quality of Life Scores:   Scores of 19 and below usually indicate a poorer quality of life in these areas.  A difference of  2-3 points is a clinically meaningful difference.   A difference of 2-3 points in the total score of the Quality of Life Index has been associated with significant improvement in overall quality of life, self-image, physical symptoms, and general health in studies assessing change in quality of life.  PHQ-9: Review Flowsheet  More data exists      01/26/2024 01/19/2024 01/08/2024 12/16/2023 08/17/2023  Depression screen PHQ 2/9  Decreased Interest 0 0 0 0 0  Down, Depressed, Hopeless 0 0 0 0 0  PHQ - 2 Score 0 0 0 0 0  Altered sleeping 0 - - 2 -  Tired, decreased energy 2 - - 0 -  Change in appetite 0 - - 0 -  Feeling bad or failure about yourself  0 - - 0 -  Trouble concentrating 0 - - 0 -  Moving slowly or fidgety/restless 0 - - 0 -  Suicidal thoughts 0 - - 0 -  PHQ-9 Score 2 - - 2 -  Difficult doing work/chores Not difficult at all - - Not difficult at all -   Interpretation of Total Score  Total Score Depression Severity:  1-4 = Minimal depression, 5-9 = Mild depression, 10-14 = Moderate depression, 15-19 = Moderately severe depression, 20-27 = Severe depression   Psychosocial Evaluation and Intervention:  Psychosocial Evaluation - 01/25/24 1149       Psychosocial Evaluation & Interventions   Interventions Stress management education;Relaxation education;Encouraged to exercise with the program and follow exercise prescription    Comments Patient referred to CR with chronic systolic HF. He denies any depression/anxiety but says he has trouble sleeping with panic feelings. He in on vistaril  prn. He is currenlty living with his mother to help her out. He is disabled. His goals for the program are to get his heart stronger and to be able to do more activities especially spend time with his 2 grandchildren. He has no barriers identified to complete the program.    Expected Outcomes Short Term: Patient will start the program and attend consistently. Long Term: Patient will complete the program meeting personal goals.    Continue  Psychosocial Services  Follow up required by staff          Psychosocial Re-Evaluation:   Psychosocial Discharge (Final Psychosocial Re-Evaluation):   Vocational Rehabilitation: Provide vocational rehab assistance to qualifying candidates.   Vocational Rehab Evaluation & Intervention:  Vocational Rehab - 01/25/24 1147  Initial Vocational Rehab Evaluation & Intervention   Assessment shows need for Vocational Rehabilitation No      Vocational Rehab Re-Evaulation   Comments Disabled.          Education: Education Goals: Education classes will be provided on a variety of topics geared toward better understanding of heart health and risk factor modification. Participant will state understanding/return demonstration of topics presented as noted by education test scores.  Learning Barriers/Preferences:  Learning Barriers/Preferences - 01/25/24 1147       Learning Barriers/Preferences   Learning Barriers None    Learning Preferences Audio;Written Material;Skilled Demonstration          General Cardiac Education Topics:  AED/CPR: - Group verbal and written instruction with the use of models to demonstrate the basic use of the AED with the basic ABC's of resuscitation.   Test and Procedures: - Group verbal and visual presentation and models provide information about basic cardiac anatomy and function. Reviews the testing methods done to diagnose heart disease and the outcomes of the test results. Describes the treatment choices: Medical Management, Angioplasty, or Coronary Bypass Surgery for treating various heart conditions including Myocardial Infarction, Angina, Valve Disease, and Cardiac Arrhythmias. Written material provided at class time.   Medication Safety: - Group verbal and visual instruction to review commonly prescribed medications for heart and lung disease. Reviews the medication, class of the drug, and side effects. Includes the steps to properly store  meds and maintain the prescription regimen. Written material provided at class time.   Intimacy: - Group verbal instruction through game format to discuss how heart and lung disease can affect sexual intimacy. Written material provided at class time.   Know Your Numbers and Heart Failure: - Group verbal and visual instruction to discuss disease risk factors for cardiac and pulmonary disease and treatment options.  Reviews associated critical values for Overweight/Obesity, Hypertension, Cholesterol, and Diabetes.  Discusses basics of heart failure: signs/symptoms and treatments.  Introduces Heart Failure Zone chart for action plan for heart failure. Written material provided at class time.   Infection Prevention: - Provides verbal and written material to individual with discussion of infection control including proper hand washing and proper equipment cleaning during exercise session.   Falls Prevention: - Provides verbal and written material to individual with discussion of falls prevention and safety.   Other: -Provides group and verbal instruction on various topics (see comments)   Knowledge Questionnaire Score:   Core Components/Risk Factors/Patient Goals at Admission:  Personal Goals and Risk Factors at Admission - 01/26/24 1358       Core Components/Risk Factors/Patient Goals on Admission    Weight Management Weight Maintenance;Yes    Intervention Weight Management: Develop a combined nutrition and exercise program designed to reach desired caloric intake, while maintaining appropriate intake of nutrient and fiber, sodium and fats, and appropriate energy expenditure required for the weight goal.;Weight Management: Provide education and appropriate resources to help participant work on and attain dietary goals.    Expected Outcomes Short Term: Continue to assess and modify interventions until short term weight is achieved;Long Term: Adherence to nutrition and physical  activity/exercise program aimed toward attainment of established weight goal;Weight Maintenance: Understanding of the daily nutrition guidelines, which includes 25-35% calories from fat, 7% or less cal from saturated fats, less than 200mg  cholesterol, less than 1.5gm of sodium, & 5 or more servings of fruits and vegetables daily    Improve shortness of breath with ADL's Yes    Intervention Provide education,  individualized exercise plan and daily activity instruction to help decrease symptoms of SOB with activities of daily living.    Expected Outcomes Short Term: Improve cardiorespiratory fitness to achieve a reduction of symptoms when performing ADLs;Long Term: Be able to perform more ADLs without symptoms or delay the onset of symptoms    Diabetes Yes    Intervention Provide education about proper nutrition, including hydration, and aerobic/resistive exercise prescription along with prescribed medications to achieve blood glucose in normal ranges: Fasting glucose 65-99 mg/dL;Provide education about signs/symptoms and action to take for hypo/hyperglycemia.    Expected Outcomes Short Term: Participant verbalizes understanding of the signs/symptoms and immediate care of hyper/hypoglycemia, proper foot care and importance of medication, aerobic/resistive exercise and nutrition plan for blood glucose control.;Long Term: Attainment of HbA1C < 7%.    Heart Failure Yes    Intervention Provide a combined exercise and nutrition program that is supplemented with education, support and counseling about heart failure. Directed toward relieving symptoms such as shortness of breath, decreased exercise tolerance, and extremity edema.    Expected Outcomes Improve functional capacity of life;Short term: Attendance in program 2-3 days a week with increased exercise capacity. Reported lower sodium intake. Reported increased fruit and vegetable intake. Reports medication compliance.;Short term: Daily weights obtained and  reported for increase. Utilizing diuretic protocols set by physician.;Long term: Adoption of self-care skills and reduction of barriers for early signs and symptoms recognition and intervention leading to self-care maintenance.    Hypertension Yes    Intervention Provide education on lifestyle modifcations including regular physical activity/exercise, weight management, moderate sodium restriction and increased consumption of fresh fruit, vegetables, and low fat dairy, alcohol moderation, and smoking cessation.;Monitor prescription use compliance.    Expected Outcomes Short Term: Continued assessment and intervention until BP is < 140/3mm HG in hypertensive participants. < 130/56mm HG in hypertensive participants with diabetes, heart failure or chronic kidney disease.;Long Term: Maintenance of blood pressure at goal levels.    Lipids Yes    Intervention Provide education and support for participant on nutrition & aerobic/resistive exercise along with prescribed medications to achieve LDL 70mg , HDL >40mg .    Expected Outcomes Short Term: Participant states understanding of desired cholesterol values and is compliant with medications prescribed. Participant is following exercise prescription and nutrition guidelines.;Long Term: Cholesterol controlled with medications as prescribed, with individualized exercise RX and with personalized nutrition plan. Value goals: LDL < 70mg , HDL > 40 mg.          Education:Diabetes - Individual verbal and written instruction to review signs/symptoms of diabetes, desired ranges of glucose level fasting, after meals and with exercise. Acknowledge that pre and post exercise glucose checks will be done for 3 sessions at entry of program.   Core Components/Risk Factors/Patient Goals Review:    Core Components/Risk Factors/Patient Goals at Discharge (Final Review):    ITP Comments:  ITP Comments     Row Name 01/25/24 1153 01/26/24 1358 01/27/24 1543 02/24/24 1818  03/23/24 0926   ITP Comments Virtual orientation visit completed for cardiac rehab with chronic HF. On-site orientation visit scheduled for 01/26/24 at 1300. Patient attend orientation today.  Patient is attending Cardiac Rehabilitation Program.  Documentation for diagnosis can be found in [facility] 12/16/23.  Reviewed medical chart, RPE/RPD, gym safety, and program guidelines.  Patient was fitted to equipment they will be using during rehab.  Patient is scheduled to start exercise on Thursday 01/28/24 at 1500.   Initial ITP created and sent for review and signature by Dr.  Dorn Ross, Medical Director for Cardiac Rehabilitation Program. 30 day review completed. ITP sent to Dr. Dorn Ross, Medical Director of Cardiac Rehab. Continue with ITP unless changes are made by physician.  Only attended orientation yesterday 30 day review completed. ITP sent to Dr. Dorn Ross, Medical Director of Cardiac Rehab. Continue with ITP unless changes are made by physician.  Only attended orientation as he has yet to show up for exercise. 30 day review completed. ITP sent to Dr. Dorn Ross, Medical Director of Cardiac Rehab. Continue with ITP unless changes are made by physician.  Only attended orientation as he has yet to show up for exercise.      Comments: Discharge ITP

## 2024-03-25 ENCOUNTER — Ambulatory Visit: Payer: Self-pay

## 2024-03-28 ENCOUNTER — Telehealth (HOSPITAL_COMMUNITY): Payer: Self-pay

## 2024-03-28 NOTE — Telephone Encounter (Signed)
 Called to confirm/remind patient of their appointment at the [facility] on 03/29/24.   Patient canceled and will keep his upcoming appointment.

## 2024-03-29 ENCOUNTER — Ambulatory Visit (HOSPITAL_COMMUNITY)

## 2024-03-31 ENCOUNTER — Ambulatory Visit (HOSPITAL_COMMUNITY)

## 2024-04-05 ENCOUNTER — Ambulatory Visit (HOSPITAL_COMMUNITY)

## 2024-04-06 ENCOUNTER — Ambulatory Visit (HOSPITAL_COMMUNITY): Admitting: Cardiology

## 2024-04-07 ENCOUNTER — Ambulatory Visit (HOSPITAL_COMMUNITY)

## 2024-04-11 ENCOUNTER — Telehealth (HOSPITAL_COMMUNITY): Payer: Self-pay | Admitting: Adult Health

## 2024-04-11 NOTE — Progress Notes (Signed)
 [facility] NOTE   Primary Care:Dr Dettinger.  HF Cardiologist: Dr Court   HPI: Evan [name] is a 58 y.o. 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  Admitted 12/24 with acute on chronic CHF. He was diuresed. UDS + for opiates, cocaine and amphetamines. 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 He was transferred to [facility] for inpatient dermatology evaluation. Later diagnosed with prurigo nodules.  Admitted in 01/25 with a/c CHF with low-output. Required inotrope support with milrinone . Aggressively diuresed and GDMT titrated. UDS + for cocaine.   Echo 5/25 showed EF < 20%, G3DD, RV severely reduced, mod to severe MR, moderate TR  CPX 12/2023 Peak VO2: 16.5 51% predicted peak VO2  VE/VCO2 slope:  35  QUES: 1.7 (56% of predicted)  Peak RER: 1.10  Mod HF functional limitations.   Today he returns for HF follow up with his daughter. Complaining of tooth pain. No chest pain.  Occasionally short of breath. Denies PND/Orthopnea. Limited by joint pain. Appetite ok. No fever or chills. Weight at home 218  pounds. Taking all medications.He does drink alcohol. Last used cocaine a month ago.     Current Outpatient Medications  Medication Sig Dispense Refill   acetaminophen  (TYLENOL ) 325 MG tablet Take 2 tablets (650 mg total) by mouth every 6 (six) hours as needed for mild pain (pain score 1-3) or fever (or Fever >/= 101).     albuterol  (VENTOLIN  HFA) 108 (90 Base) MCG/ACT inhaler Inhale 2 puffs into the lungs as needed for wheezing or shortness of breath. 18 g 2   apixaban  (ELIQUIS ) 5 MG TABS tablet Take 1 tablet (5 mg total) by mouth 2 (two) times  daily. 180 tablet 3   atorvastatin  (LIPITOR ) 80 MG tablet Take 1 tablet (80 mg total) by mouth daily. 90 tablet 3   BD PEN NEEDLE NANO 2ND GEN 32G X 4 MM MISC Use daily with insulin  Dx E11.9, Z79.4 100 each 3   Blood Glucose Monitoring Suppl (ACCU-CHEK AVIVA PLUS) w/Device KIT 1 each by Does not apply route 4 (four) times daily. 1 kit 1   Blood Glucose Monitoring Suppl (ACCU-CHEK AVIVA) device Test BS BID Dx E11.69 1 each 0   carvedilol  (COREG ) 3.125 MG tablet Take 1 tablet (3.125 mg total) by mouth 2 (two) times daily. 180 tablet 3   digoxin  (LANOXIN ) 0.125 MG tablet Take 1 tablet (0.125 mg total) by mouth daily. 90 tablet 3   empagliflozin  (JARDIANCE ) 25 MG TABS tablet Take 1 tablet (25 mg total) by mouth daily. 90 tablet 3   fenofibrate  (TRICOR ) 145 MG tablet TAKE 1 TABLET BY MOUTH EVERY DAY 90 tablet 3   ferrous sulfate  325 (65 FE) MG EC tablet Take 1 tablet (325 mg total) by mouth every other day. 45 tablet 3   fluticasone  (FLONASE ) 50 MCG/ACT nasal spray Place 1 spray into both nostrils 2 (two) times daily as needed for allergies or rhinitis. 16 g 6   glucose blood (ACCU-CHEK GUIDE) test strip Check BS in the morning and at bedtime Dx E11.8 200 strip 3   hydrOXYzine  (VISTARIL ) 25 MG capsule TAKE 1 CAPSULE (25 MG TOTAL) BY MOUTH EVERY 8 (EIGHT) HOURS AS NEEDED. 30 capsule 1   insulin  glargine (LANTUS  SOLOSTAR)  100 UNIT/ML Solostar Pen Inject 10 Units into the skin daily. 9 mL 3   potassium chloride  SA (KLOR-CON  M) 20 MEQ tablet TAKE 3 TABLETS BY MOUTH DAILY 270 tablet 1   spironolactone  (ALDACTONE ) 25 MG tablet Take 1 tablet (25 mg total) by mouth daily. 90 tablet 3   torsemide  (DEMADEX ) 20 MG tablet TAKE 2 TABLETS BY MOUTH 2 TIMES DAILY. 360 tablet 3   famotidine  (PEPCID ) 20 MG tablet Take 1 tablet (20 mg total) by mouth 2 (two) times daily. (Patient not taking: Reported on 01/25/2024) 180 tablet 3   pantoprazole  (PROTONIX ) 40 MG tablet Take 40 mg by mouth daily. (Patient not taking: Reported  on 04/12/2024)     No current facility-administered medications for this encounter.   Wt Readings from Last 3 Encounters:  04/12/24 99.3 kg (219 lb)  01/26/24 98.6 kg (217 lb 6 oz)  12/16/23 99.9 kg (220 lb 3.2 oz)   BP 130/70   Pulse 88   Wt 99.3 kg (219 lb)   SpO2 99%   BMI 27.37 kg/m   PHYSICAL EXAM: General:   No resp difficulty Neck: no JVD.  Cor: Regular rate & rhythm.  Lungs: clear Abdomen: soft, nontender, nondistended.  Extremities: no  edema Neuro: alert & oriented x3  DATA REVIEW  ECG: 05/28/23: NSR with old anterior infarct    ECHO: 09/29/23: LVEF < 20%, G3DD, RV severely reduced, mod to severe MR, moderate TR 05/05/23: LVEF 20%, thrombus at LV apex, moderately reduced RV function, RVSP 60 mmHg, moderate MR 4/22: LVEF 35%-40%, normal RV function  CATH: 11/24/18:  1.  Severe three-vessel coronary artery disease.  Occluded ostial and mid LAD likely due to late presenting anterior STEMI.  Significant distal left circumflex disease but the supplied territory is small.  Significant mid RCA stenosis with occluded posterior AV groove with left-to-right collaterals. 2.  Severely reduced LV systolic function with an EF of 20 to 25% with anterior wall akinesis.  LVEDP was 32 mmHg. 3.  Successful PCI and drug-eluting stent placement to the ostial as well as mid LAD.   ASSESSMENT & PLAN: Chronic Biventricular HFrEF Etiology of HF: mixed ischemic & nonsichemic cardiomyopathy; likely exacerbated by polysubstance abuse.  NYHA class / AHA Stage: NYHA II Volume status & Diuretics: Appears euvolemic. Continue torsemide  40 mg bid  Vasodilators: Taken off losartan  in June due to low BP. -Beta-Blocker: Continue Coreg  3.125 mg bid - Continue digoxin  0.125 mg daily. Dig level 0.4 10/27/23. MRA: Continue spironolactone  25 mg daily Cardiometabolic: Continue Jardiance  25 mg daily Devices therapies & Valvulopathies: Not currently indicated Advanced therapies: Last used cocaine a month  ago. We discussed he may need advanced therapies down the road. Imperative to stop using cocaine and take all medications.  Repeat ECHO next visit. He does not have ICD if EF remains severely reduced consider EP referral. Narrow QRS.  Check BMET   2. CAD - s/p PCI to the LAD & RCA in 7/20 - No chest pain.  - Continue atorvastatin  80 mg daily - Continue eliquis  5 mg twice a day  - Last LDL was 56 in 3/25  3. MR - Moderate in severity on echo 12/24, repeat ECHO next visit.  - Suspect functional  4. LV thrombus - Continue Eliquis  5 mg bid, no bleeding issues.   5. Polysubstance abuse - Previously used meth and cocaine daily - Last used cocaine a month.  -Discussed cessation for consideration of advanced therapies.   6. Chronic venous insufficency with  LE ulcers - Resolved.   Follow up in 6 weeks with Dr Zenaida and an ECHO   Karli Wickizer NP-C   04/12/24

## 2024-04-11 NOTE — Telephone Encounter (Signed)
 Called to confirm/remind patient of their appointment at the [facility] on 04/11/2024.   Appointment:   [x] Confirmed  [] Left mess   [] No answer/No voice mail  [] VM Full/unable to leave message  [] Phone not in service  Patient reminded to bring all medications and/or complete list.  Confirmed patient has transportation. Gave directions, instructed to utilize valet parking.

## 2024-04-12 ENCOUNTER — Ambulatory Visit (HOSPITAL_COMMUNITY)
Admission: RE | Admit: 2024-04-12 | Discharge: 2024-04-12 | Disposition: A | Discharge status: Home / Self Care | Source: Ambulatory Visit | Attending: Adult Health | Admitting: Adult Health

## 2024-04-12 ENCOUNTER — Ambulatory Visit (HOSPITAL_COMMUNITY): Payer: Self-pay | Admitting: Adult Health

## 2024-04-12 ENCOUNTER — Ambulatory Visit (HOSPITAL_COMMUNITY)

## 2024-04-12 VITALS — BP 130/70 | HR 88 | Wt 219.0 lb

## 2024-04-12 DIAGNOSIS — I428 Other cardiomyopathies: Secondary | ICD-10-CM | POA: Insufficient documentation

## 2024-04-12 DIAGNOSIS — I513 Intracardiac thrombosis, not elsewhere classified: Secondary | ICD-10-CM | POA: Diagnosis not present

## 2024-04-12 DIAGNOSIS — F1910 Other psychoactive substance abuse, uncomplicated: Secondary | ICD-10-CM | POA: Diagnosis not present

## 2024-04-12 DIAGNOSIS — I340 Nonrheumatic mitral (valve) insufficiency: Secondary | ICD-10-CM | POA: Diagnosis not present

## 2024-04-12 DIAGNOSIS — I110 Hypertensive heart disease with heart failure: Secondary | ICD-10-CM | POA: Insufficient documentation

## 2024-04-12 DIAGNOSIS — E785 Hyperlipidemia, unspecified: Secondary | ICD-10-CM | POA: Insufficient documentation

## 2024-04-12 DIAGNOSIS — I5022 Chronic systolic (congestive) heart failure: Secondary | ICD-10-CM | POA: Insufficient documentation

## 2024-04-12 DIAGNOSIS — Z955 Presence of coronary angioplasty implant and graft: Secondary | ICD-10-CM | POA: Diagnosis not present

## 2024-04-12 DIAGNOSIS — G4733 Obstructive sleep apnea (adult) (pediatric): Secondary | ICD-10-CM | POA: Diagnosis not present

## 2024-04-12 DIAGNOSIS — Z7901 Long term (current) use of anticoagulants: Secondary | ICD-10-CM | POA: Insufficient documentation

## 2024-04-12 DIAGNOSIS — I480 Paroxysmal atrial fibrillation: Secondary | ICD-10-CM | POA: Insufficient documentation

## 2024-04-12 DIAGNOSIS — I2510 Atherosclerotic heart disease of native coronary artery without angina pectoris: Secondary | ICD-10-CM | POA: Diagnosis not present

## 2024-04-12 DIAGNOSIS — I5082 Biventricular heart failure: Secondary | ICD-10-CM | POA: Insufficient documentation

## 2024-04-12 DIAGNOSIS — I255 Ischemic cardiomyopathy: Secondary | ICD-10-CM | POA: Insufficient documentation

## 2024-04-12 DIAGNOSIS — Z7984 Long term (current) use of oral hypoglycemic drugs: Secondary | ICD-10-CM | POA: Insufficient documentation

## 2024-04-12 DIAGNOSIS — E119 Type 2 diabetes mellitus without complications: Secondary | ICD-10-CM | POA: Insufficient documentation

## 2024-04-12 DIAGNOSIS — I25118 Atherosclerotic heart disease of native coronary artery with other forms of angina pectoris: Secondary | ICD-10-CM | POA: Diagnosis not present

## 2024-04-12 DIAGNOSIS — Z794 Long term (current) use of insulin: Secondary | ICD-10-CM | POA: Insufficient documentation

## 2024-04-12 DIAGNOSIS — F1510 Other stimulant abuse, uncomplicated: Secondary | ICD-10-CM | POA: Insufficient documentation

## 2024-04-12 DIAGNOSIS — F1410 Cocaine abuse, uncomplicated: Secondary | ICD-10-CM | POA: Diagnosis not present

## 2024-04-12 DIAGNOSIS — I252 Old myocardial infarction: Secondary | ICD-10-CM | POA: Insufficient documentation

## 2024-04-12 LAB — BASIC METABOLIC PANEL WITH GFR
Anion gap: 12 (ref 5–15)
BUN: 22 mg/dL — ABNORMAL HIGH (ref 6–20)
CO2: 27 mmol/L (ref 22–32)
Calcium: 8.8 mg/dL — ABNORMAL LOW (ref 8.9–10.3)
Chloride: 99 mmol/L (ref 98–111)
Creatinine, Ser: 1.32 mg/dL — ABNORMAL HIGH (ref 0.61–1.24)
GFR, Estimated: >60 mL/min (ref >60)
Glucose, Bld: 105 mg/dL — ABNORMAL HIGH (ref 70–99)
Potassium: 3.3 mmol/L — ABNORMAL LOW (ref 3.5–5.1)
Sodium: 138 mmol/L (ref 135–145)

## 2024-04-12 MED ORDER — PANTOPRAZOLE SODIUM 40 MG PO TBEC: 40.0000 mg | DELAYED_RELEASE_TABLET | Freq: Every day | ORAL | 3 refills | Status: AC

## 2024-04-12 MED ORDER — POTASSIUM CHLORIDE CRYS ER 20 MEQ PO TBCR: 40.0000 meq | EXTENDED_RELEASE_TABLET | Freq: Two times a day (BID) | ORAL | 1 refills | Status: AC

## 2024-04-12 NOTE — Telephone Encounter (Signed)
 Pt's potassium medication has been updated in his chart. Pt aware, agreeable, and verbalized understanding.

## 2024-04-12 NOTE — Addendum Note (Signed)
 Addended by: DANTE JEANNINE HERO on: 04/12/2024 03:02 PM   Modules accepted: Orders

## 2024-04-12 NOTE — Telephone Encounter (Signed)
-----   Message from Greig JONETTA Mosses, NP sent at 04/12/2024  1:09 PM EST -----   ----- Message ----- From: Rebecka, Lab [facility] [place] Sent: 04/12/2024  11:23 AM EST To: Greig JONETTA Mosses, NP

## 2024-04-12 NOTE — Telephone Encounter (Signed)
 Please call.   Potassium 3.3 Increase potassium 40 meq twice a day   Chantell Kunkler NP-C  1:09 PM

## 2024-04-12 NOTE — Patient Instructions (Signed)
 Medication Changes:  PROTONIX  REFILLED   Lab Work:  Labs done today, your results will be available in MyChart, we will contact you for abnormal readings.  Follow-Up in: 6 weeks with Echo as scheduled   At the [facility], you and your health needs are our priority. We have a designated team specialized in the treatment of Heart Failure. This Care Team includes your primary Heart Failure Specialized Cardiologist (physician), Advanced Practice Providers (APPs- Physician Assistants and Nurse Practitioners), and Pharmacist who all work together to provide you with the care you need, when you need it.   You may see any of the following providers on your designated Care Team at your next follow up:  Dr. Toribio Fuel Dr. Ezra Shuck Dr. Odis Brownie Greig Mosses, NP Caffie Shed, [place] [facility] [place], [place] Beckey Coe, NP Jordan Lee, NP Tinnie Redman, PharmD   Please be sure to bring in all your medications bottles to every appointment.   Need to Contact Us :  If you have any questions or concerns before your next appointment please send us  a message through Portal or call our office at [phone].    TO LEAVE A MESSAGE FOR THE NURSE SELECT OPTION 2, PLEASE LEAVE A MESSAGE INCLUDING: YOUR NAME DATE OF BIRTH CALL BACK NUMBER REASON FOR CALL**this is important as we prioritize the call backs  YOU WILL RECEIVE A CALL BACK THE SAME DAY AS LONG AS YOU CALL BEFORE 4:00 PM

## 2024-04-19 ENCOUNTER — Ambulatory Visit (HOSPITAL_COMMUNITY)

## 2024-04-21 ENCOUNTER — Ambulatory Visit

## 2024-04-21 ENCOUNTER — Ambulatory Visit (HOSPITAL_COMMUNITY)

## 2024-04-26 ENCOUNTER — Ambulatory Visit (HOSPITAL_COMMUNITY)

## 2024-04-28 ENCOUNTER — Ambulatory Visit

## 2024-04-28 ENCOUNTER — Encounter: Payer: Self-pay | Admitting: Family Medicine

## 2024-04-28 ENCOUNTER — Ambulatory Visit (HOSPITAL_COMMUNITY)

## 2024-04-28 VITALS — BP 107/67 | HR 73 | Temp 98.1°F | Ht 75.0 in | Wt 239.0 lb

## 2024-04-28 DIAGNOSIS — I152 Hypertension secondary to endocrine disorders: Secondary | ICD-10-CM

## 2024-04-28 DIAGNOSIS — E1169 Type 2 diabetes mellitus with other specified complication: Secondary | ICD-10-CM

## 2024-04-28 DIAGNOSIS — E118 Type 2 diabetes mellitus with unspecified complications: Secondary | ICD-10-CM | POA: Diagnosis not present

## 2024-04-28 DIAGNOSIS — E11628 Type 2 diabetes mellitus with other skin complications: Secondary | ICD-10-CM | POA: Diagnosis not present

## 2024-04-28 DIAGNOSIS — E1159 Type 2 diabetes mellitus with other circulatory complications: Secondary | ICD-10-CM | POA: Diagnosis not present

## 2024-04-28 DIAGNOSIS — Z23 Encounter for immunization: Secondary | ICD-10-CM | POA: Diagnosis not present

## 2024-04-28 DIAGNOSIS — L84 Corns and callosities: Secondary | ICD-10-CM | POA: Diagnosis not present

## 2024-04-28 DIAGNOSIS — E785 Hyperlipidemia, unspecified: Secondary | ICD-10-CM | POA: Diagnosis not present

## 2024-04-28 DIAGNOSIS — Z794 Long term (current) use of insulin: Secondary | ICD-10-CM | POA: Diagnosis not present

## 2024-04-28 LAB — BAYER DCA HB A1C WAIVED: HB A1C (BAYER DCA - WAIVED): 6.2 % — ABNORMAL HIGH (ref 4.8–5.6)

## 2024-04-28 MED ORDER — EMPAGLIFLOZIN 25 MG PO TABS: 25.0000 mg | ORAL_TABLET | Freq: Every day | ORAL | 3 refills | Status: AC

## 2024-04-28 NOTE — Progress Notes (Signed)
 BP 107/67   Pulse 73   Temp 98.1 F (36.7 C)   Ht 6' 3 (1.905 m)   Wt 239 lb (108.4 kg)   SpO2 98%   BMI 29.87 kg/m    Subjective:   Patient ID: Evan [name], male    DOB: 1965-11-02, 58 y.o.   MRN: [id number]  HPI: Evan [name] is a 58 y.o. male presenting on 04/28/2024 for Medical Management of Chronic Issues and Diabetes   Discussed the use of AI scribe software for clinical note transcription with the patient, who gave verbal consent to proceed.  History of Present Illness   Evan [name] is a 58 year old male who presents with calluses and a history of an abscess on his toes.  Digital calluses - Calluses present on the ends of toes for a prolonged duration - Both toes and feet are similarly affected - Maintains hygiene by cleaning between toes regularly  History of digital abscess - Previous abscess on toe that ruptured spontaneously - No recent evaluation by a podiatrist - Previously evaluated by Dr. Harden for this condition  Glycemic control - Has not checked blood glucose in the past several days - No specific blood glucose values provided - Indicates blood sugar is generally well controlled  Cardiac evaluation - Previous cardiology evaluation with no abnormalities detected - Aware of change in cardiologist as previous provider is no longer available          Relevant past medical, surgical, family and social history reviewed and updated as indicated. Interim medical history since our last visit reviewed. Allergies and medications reviewed and updated.  Review of Systems  Constitutional:  Negative for chills and fever.  Eyes:  Negative for visual disturbance.  Respiratory:  Negative for shortness of breath and wheezing.   Cardiovascular:  Negative for chest pain and leg swelling.  Musculoskeletal:  Negative for arthralgias, back pain and gait problem.  Skin:  Negative for rash.  Neurological:  Negative for dizziness and light-headedness.  All  other systems reviewed and are negative.   Per HPI unless specifically indicated above   Allergies as of 04/28/2024       Reactions   Metoprolol     Hypotension         Medication List        Accurate as of April 28, 2024  2:40 PM. If you have any questions, ask your nurse or doctor.          STOP taking these medications    famotidine  20 MG tablet Commonly known as: Pepcid  Stopped by: Fonda Levins, MD       TAKE these medications    Accu-Chek Aviva device Test BS BID Dx E11.69   Accu-Chek Aviva Plus w/Device Kit 1 each by Does not apply route 4 (four) times daily.   Accu-Chek Guide test strip Generic drug: glucose blood Check BS in the morning and at bedtime Dx E11.8   acetaminophen  325 MG tablet Commonly known as: TYLENOL  Take 2 tablets (650 mg total) by mouth every 6 (six) hours as needed for mild pain (pain score 1-3) or fever (or Fever >/= 101).   apixaban  5 MG Tabs tablet Commonly known as: ELIQUIS  Take 1 tablet (5 mg total) by mouth 2 (two) times daily.   atorvastatin  80 MG tablet Commonly known as: LIPITOR  Take 1 tablet (80 mg total) by mouth daily.   BD Pen Needle Nano 2nd Gen 32G X 4 MM Misc Generic drug: Insulin   Pen Needle Use daily with insulin  Dx E11.9, Z79.4   carvedilol  3.125 MG tablet Commonly known as: Coreg  Take 1 tablet (3.125 mg total) by mouth 2 (two) times daily.   digoxin  0.125 MG tablet Commonly known as: LANOXIN  Take 1 tablet (0.125 mg total) by mouth daily.   empagliflozin  25 MG Tabs tablet Commonly known as: Jardiance  Take 1 tablet (25 mg total) by mouth daily.   fenofibrate  145 MG tablet Commonly known as: TRICOR  TAKE 1 TABLET BY MOUTH EVERY DAY   ferrous sulfate  325 (65 FE) MG EC tablet Take 1 tablet (325 mg total) by mouth every other day.   fluticasone  50 MCG/ACT nasal spray Commonly known as: FLONASE  Place 1 spray into both nostrils 2 (two) times daily as needed for allergies or rhinitis.    hydrOXYzine  25 MG capsule Commonly known as: VISTARIL  TAKE 1 CAPSULE (25 MG TOTAL) BY MOUTH EVERY 8 (EIGHT) HOURS AS NEEDED.   Lantus  SoloStar 100 UNIT/ML Solostar Pen Generic drug: insulin  glargine Inject 10 Units into the skin daily.   pantoprazole  40 MG tablet Commonly known as: PROTONIX  Take 1 tablet (40 mg total) by mouth daily.   potassium chloride  SA 20 MEQ tablet Commonly known as: KLOR-CON  M Take 2 tablets (40 mEq total) by mouth 2 (two) times daily.   spironolactone  25 MG tablet Commonly known as: ALDACTONE  Take 1 tablet (25 mg total) by mouth daily.   torsemide  20 MG tablet Commonly known as: DEMADEX  TAKE 2 TABLETS BY MOUTH 2 TIMES DAILY.   Ventolin  HFA 108 (90 Base) MCG/ACT inhaler Generic drug: albuterol  Inhale 2 puffs into the lungs as needed for wheezing or shortness of breath.         Objective:   BP 107/67   Pulse 73   Temp 98.1 F (36.7 C)   Ht 6' 3 (1.905 m)   Wt 239 lb (108.4 kg)   SpO2 98%   BMI 29.87 kg/m   Wt Readings from Last 3 Encounters:  04/28/24 239 lb (108.4 kg)  04/12/24 219 lb (99.3 kg)  01/26/24 217 lb 6 oz (98.6 kg)    Physical Exam Vitals and nursing note reviewed.  Constitutional:      Appearance: Normal appearance.  Pulmonary:     Effort: Pulmonary effort is normal. No respiratory distress.     Breath sounds: Normal breath sounds. No wheezing, rhonchi or rales.  Neurological:     Mental Status: He is alert.    Physical Exam   CARDIOVASCULAR: Heart sounds normal.       Diabetic Foot Exam - Simple   Simple Foot Form Diabetic Foot exam was performed with the following findings: Yes 04/28/2024  2:38 PM  Visual Inspection No deformities, no ulcerations, no other skin breakdown bilaterally: Yes Sensation Testing Intact to touch and monofilament testing bilaterally: Yes Pulse Check Posterior Tibialis and Dorsalis pulse intact bilaterally: Yes Comments Calluses on the ends of his second toe on the right foot and  the left foot.      Assessment & Plan:   Problem List Items Addressed This Visit       Cardiovascular and Mediastinum   Hypertension associated with diabetes (HCC)   Relevant Medications   empagliflozin  (JARDIANCE ) 25 MG TABS tablet     Endocrine   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) - Primary   Relevant Medications   empagliflozin  (JARDIANCE ) 25 MG TABS tablet   Other Relevant Orders   CBC with Differential/Platelet   CMP14+EGFR  Lipid panel   TSH   Bayer DCA Hb A1c Waived   Microalbumin / creatinine urine ratio   Hyperlipidemia associated with type 2 diabetes mellitus (HCC)   Relevant Medications   empagliflozin  (JARDIANCE ) 25 MG TABS tablet   Type 2 diabetes mellitus with complications (HCC)   Relevant Medications   empagliflozin  (JARDIANCE ) 25 MG TABS tablet       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 Awaiting A1c results for glycemic control assessment. - Checked A1c level. - Encouraged regular foot hygiene and monitoring for skin changes.          Follow up plan: Return in about 3 months (around 07/27/2024), or if symptoms worsen or fail to improve, for Diabetes recheck.  Counseling provided for all of the vaccine components Orders Placed This Encounter  Procedures   CBC with Differential/Platelet   CMP14+EGFR   Lipid panel   TSH   Bayer DCA Hb A1c Waived   Microalbumin / creatinine urine ratio    Fonda Levins, MD [facility] 04/28/2024, 2:40 PM

## 2024-04-29 LAB — CMP14+EGFR
ALT: 17 IU/L (ref 0–44)
AST: 21 IU/L (ref 0–40)
Albumin: 4.3 g/dL (ref 3.8–4.9)
Alkaline Phosphatase: 73 IU/L (ref 47–123)
BUN/Creatinine Ratio: 25 — ABNORMAL HIGH (ref 9–20)
BUN: 28 mg/dL — ABNORMAL HIGH (ref 6–24)
Bilirubin Total: 0.3 mg/dL (ref 0.0–1.2)
CO2: 24 mmol/L (ref 20–29)
Calcium: 9.2 mg/dL (ref 8.7–10.2)
Chloride: 101 mmol/L (ref 96–106)
Creatinine, Ser: 1.11 mg/dL (ref 0.76–1.27)
Globulin, Total: 2.8 g/dL (ref 1.5–4.5)
Glucose: 218 mg/dL — ABNORMAL HIGH (ref 70–99)
Potassium: 3.9 mmol/L (ref 3.5–5.2)
Sodium: 139 mmol/L (ref 134–144)
Total Protein: 7.1 g/dL (ref 6.0–8.5)
eGFR: 77 mL/min/1.73 (ref >59)

## 2024-04-29 LAB — LIPID PANEL
Chol/HDL Ratio: 3.4 ratio (ref 0.0–5.0)
Cholesterol, Total: 146 mg/dL (ref 100–199)
HDL: 43 mg/dL (ref >39)
LDL Chol Calc (NIH): 72 mg/dL (ref 0–99)
Triglycerides: 187 mg/dL — ABNORMAL HIGH (ref 0–149)
VLDL Cholesterol Cal: 31 mg/dL (ref 5–40)

## 2024-04-29 LAB — CBC WITH DIFFERENTIAL/PLATELET
Basophils Absolute: 0.1 x10E3/uL (ref 0.0–0.2)
Basos: 1 %
EOS (ABSOLUTE): 0.2 x10E3/uL (ref 0.0–0.4)
Eos: 2 %
Hematocrit: 44.5 % (ref 37.5–51.0)
Hemoglobin: 14.7 g/dL (ref 13.0–17.7)
Immature Grans (Abs): 0 x10E3/uL (ref 0.0–0.1)
Immature Granulocytes: 0 %
Lymphocytes Absolute: 1.4 x10E3/uL (ref 0.7–3.1)
Lymphs: 15 %
MCH: 32.6 pg (ref 26.6–33.0)
MCHC: 33 g/dL (ref 31.5–35.7)
MCV: 99 fL — ABNORMAL HIGH (ref 79–97)
Monocytes Absolute: 0.8 x10E3/uL (ref 0.1–0.9)
Monocytes: 8 %
Neutrophils Absolute: 7.1 x10E3/uL — ABNORMAL HIGH (ref 1.4–7.0)
Neutrophils: 74 %
Platelets: 238 x10E3/uL (ref 150–450)
RBC: 4.51 x10E6/uL (ref 4.14–5.80)
RDW: 12.3 % (ref 11.6–15.4)
WBC: 9.5 x10E3/uL (ref 3.4–10.8)

## 2024-04-29 LAB — MICROALBUMIN / CREATININE URINE RATIO
Creatinine, Urine: 35.2 mg/dL
Microalb/Creat Ratio: 20 mg/g{creat} (ref 0–29)
Microalbumin, Urine: 7.2 ug/mL

## 2024-04-29 LAB — TSH: TSH: 1.37 u[IU]/mL (ref 0.450–4.500)

## 2024-05-03 ENCOUNTER — Ambulatory Visit (HOSPITAL_COMMUNITY)

## 2024-05-04 ENCOUNTER — Ambulatory Visit: Payer: Self-pay | Admitting: Family Medicine

## 2024-05-05 ENCOUNTER — Ambulatory Visit (HOSPITAL_COMMUNITY)

## 2024-05-10 ENCOUNTER — Ambulatory Visit (HOSPITAL_COMMUNITY)

## 2024-05-10 ENCOUNTER — Other Ambulatory Visit: Payer: Self-pay | Admitting: Family Medicine

## 2024-05-10 DIAGNOSIS — F410 Panic disorder [episodic paroxysmal anxiety] without agoraphobia: Secondary | ICD-10-CM

## 2024-05-17 ENCOUNTER — Ambulatory Visit (HOSPITAL_COMMUNITY)

## 2024-05-19 ENCOUNTER — Ambulatory Visit (HOSPITAL_COMMUNITY)

## 2024-05-24 ENCOUNTER — Ambulatory Visit (HOSPITAL_COMMUNITY)

## 2024-05-24 ENCOUNTER — Telehealth (HOSPITAL_COMMUNITY): Payer: Self-pay

## 2024-05-24 NOTE — Telephone Encounter (Signed)
 Called to confirm/remind patient of their appointment at the [facility] on 05/25/24.   Appointment:   [] Confirmed  [x] Left mess   [] No answer/No voice mail  [] VM Full/unable to leave message  [] Phone not in service  And to bring in all medications and/or complete list.

## 2024-05-24 NOTE — Progress Notes (Signed)
 "  ADVANCED HEART FAILURE CLINIC NOTE   Primary Care: Dr Museum/gallery Exhibitions Officer.  HF Cardiologist: Dr. Zenaida  HPI: Evan [name] is a 59 y.o. 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  Admitted 12/24 with acute on chronic CHF. He was diuresed. UDS + for opiates, cocaine and amphetamines. 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 He was transferred to [facility] for inpatient dermatology evaluation. Later diagnosed with prurigo nodules.  Admitted in 01/25 with a/c CHF with low-output. Required inotrope support with milrinone . Aggressively diuresed and GDMT titrated. UDS + for cocaine.   Echo 5/25 showed EF < 20%, G3DD, RV severely reduced, mod to severe MR, moderate TR  CPX 12/2023 Peak VO2: 16.5 51% predicted peak VO2  VE/VCO2 slope:  35  QUES: 1.7 (56% of predicted)  Peak RER: 1.10  Mod HF functional limitations.   Today he returns for HF follow up with his daughter. Complaining of tooth pain. No chest pain.  Occasionally short of breath. Denies PND/Orthopnea. Limited by joint pain. Appetite ok. No fever or chills. Weight at home 218  pounds. Taking all medications.He does drink alcohol. Last used cocaine a month ago.     Current Outpatient Medications  Medication Sig Dispense Refill   acetaminophen  (TYLENOL ) 325 MG tablet Take 2 tablets (650 mg total) by mouth every 6 (six) hours as needed for mild pain (pain score 1-3) or fever (or Fever >/= 101).     albuterol  (VENTOLIN  HFA) 108 (90 Base) MCG/ACT inhaler Inhale 2 puffs into the lungs as needed for wheezing or shortness of breath. 18 g 2   apixaban  (ELIQUIS ) 5 MG TABS tablet Take 1 tablet (5 mg total) by mouth 2 (two)  times daily. 180 tablet 3   atorvastatin  (LIPITOR ) 80 MG tablet Take 1 tablet (80 mg total) by mouth daily. 90 tablet 3   BD PEN NEEDLE NANO 2ND GEN 32G X 4 MM MISC Use daily with insulin  Dx E11.9, Z79.4 100 each 3   Blood Glucose Monitoring Suppl (ACCU-CHEK AVIVA PLUS) w/Device KIT 1 each by Does not apply route 4 (four) times daily. 1 kit 1   Blood Glucose Monitoring Suppl (ACCU-CHEK AVIVA) device Test BS BID Dx E11.69 1 each 0   carvedilol  (COREG ) 3.125 MG tablet Take 1 tablet (3.125 mg total) by mouth 2 (two) times daily. 180 tablet 3   digoxin  (LANOXIN ) 0.125 MG tablet Take 1 tablet (0.125 mg total) by mouth daily. 90 tablet 3   empagliflozin  (JARDIANCE ) 25 MG TABS tablet Take 1 tablet (25 mg total) by mouth daily. 90 tablet 3   fenofibrate  (TRICOR ) 145 MG tablet TAKE 1 TABLET BY MOUTH EVERY DAY 90 tablet 3   ferrous sulfate  325 (65 FE) MG EC tablet Take 1 tablet (325 mg total) by mouth every other day. 45 tablet 3   fluticasone  (FLONASE ) 50 MCG/ACT nasal spray Place 1 spray into both nostrils 2 (two) times daily as needed for allergies or rhinitis. 16 g 6   glucose blood (ACCU-CHEK GUIDE) test strip Check BS in the morning and at bedtime Dx E11.8 200 strip 3   hydrOXYzine  (VISTARIL ) 25 MG capsule TAKE 1 CAPSULE (25 MG TOTAL) BY MOUTH EVERY 8 (EIGHT) HOURS AS NEEDED. 30 capsule 1   insulin  glargine (LANTUS   SOLOSTAR) 100 UNIT/ML Solostar Pen Inject 10 Units into the skin daily. 9 mL 3   pantoprazole  (PROTONIX ) 40 MG tablet Take 1 tablet (40 mg total) by mouth daily. 30 tablet 3   potassium chloride  SA (KLOR-CON  M) 20 MEQ tablet Take 2 tablets (40 mEq total) by mouth 2 (two) times daily. 90 tablet 1   spironolactone  (ALDACTONE ) 25 MG tablet Take 1 tablet (25 mg total) by mouth daily. 90 tablet 3   torsemide  (DEMADEX ) 20 MG tablet TAKE 2 TABLETS BY MOUTH 2 TIMES DAILY. 360 tablet 3   No current facility-administered medications for this visit.   Wt Readings from Last 3 Encounters:  04/28/24  108.4 kg (239 lb)  04/12/24 99.3 kg (219 lb)  01/26/24 98.6 kg (217 lb 6 oz)   There were no vitals taken for this visit.  PHYSICAL EXAM: General:   No resp difficulty Neck: no JVD.  Cor: Regular rate & rhythm.  Lungs: clear Abdomen: soft, nontender, nondistended.  Extremities: no  edema Neuro: alert & oriented x3  DATA REVIEW  ECG: 05/28/23: NSR with old anterior infarct    ECHO: 09/29/23: LVEF < 20%, G3DD, RV severely reduced, mod to severe MR, moderate TR 05/05/23: LVEF 20%, thrombus at LV apex, moderately reduced RV function, RVSP 60 mmHg, moderate MR 4/22: LVEF 35%-40%, normal RV function  CATH: 11/24/18:  1.  Severe three-vessel coronary artery disease.  Occluded ostial and mid LAD likely due to late presenting anterior STEMI.  Significant distal left circumflex disease but the supplied territory is small.  Significant mid RCA stenosis with occluded posterior AV groove with left-to-right collaterals. 2.  Severely reduced LV systolic function with an EF of 20 to 25% with anterior wall akinesis.  LVEDP was 32 mmHg. 3.  Successful PCI and drug-eluting stent placement to the ostial as well as mid LAD.   ASSESSMENT & PLAN: Chronic Biventricular HFrEF Etiology of HF: mixed ischemic & nonsichemic cardiomyopathy; likely exacerbated by polysubstance abuse.  NYHA class / AHA Stage: NYHA II Volume status & Diuretics: Appears euvolemic. Continue torsemide  40 mg bid  Vasodilators: Taken off losartan  in June due to low BP. -Beta-Blocker: Continue Coreg  3.125 mg bid - Continue digoxin  0.125 mg daily. Dig level 0.4 10/27/23. MRA: Continue spironolactone  25 mg daily Cardiometabolic: Continue Jardiance  25 mg daily Devices therapies & Valvulopathies: Not currently indicated Advanced therapies: Last used cocaine a month ago. We discussed he may need advanced therapies down the road. Imperative to stop using cocaine and take all medications.  Repeat ECHO next visit. He does not have ICD if EF  remains severely reduced consider EP referral. Narrow QRS.  Check BMET   2. CAD - s/p PCI to the LAD & RCA in 7/20 - No chest pain.  - Continue atorvastatin  80 mg daily - Continue eliquis  5 mg twice a day  - Last LDL was 56 in 3/25  3. MR - Moderate in severity on echo 12/24, repeat ECHO next visit.  - Suspect functional  4. LV thrombus - Continue Eliquis  5 mg bid, no bleeding issues.   5. Polysubstance abuse - Previously used meth and cocaine daily - Last used cocaine a month.  -Discussed cessation for consideration of advanced therapies.   6. Chronic venous insufficency with LE ulcers - Resolved.   Follow up in 6 weeks with Dr Zenaida and an ECHO   Harlene HERO [facility] NP-C   05/24/2024  "

## 2024-05-25 ENCOUNTER — Ambulatory Visit (HOSPITAL_COMMUNITY)
Admission: RE | Admit: 2024-05-25 | Discharge: 2024-05-25 | Disposition: A | Discharge status: Home / Self Care | Source: Ambulatory Visit | Attending: Adult Health | Admitting: Adult Health

## 2024-05-25 ENCOUNTER — Ambulatory Visit (HOSPITAL_BASED_OUTPATIENT_CLINIC_OR_DEPARTMENT_OTHER)
Admission: RE | Admit: 2024-05-25 | Discharge: 2024-05-25 | Disposition: A | Discharge status: Home / Self Care | Source: Ambulatory Visit | Attending: Adult Health | Admitting: Adult Health

## 2024-05-25 ENCOUNTER — Ambulatory Visit (HOSPITAL_COMMUNITY): Payer: Self-pay | Admitting: Family Medicine

## 2024-05-25 ENCOUNTER — Encounter: Payer: Self-pay | Admitting: Cardiovascular Disease

## 2024-05-25 ENCOUNTER — Encounter (HOSPITAL_COMMUNITY): Payer: Self-pay

## 2024-05-25 VITALS — BP 126/64 | HR 73 | Ht 77.0 in | Wt 227.4 lb

## 2024-05-25 DIAGNOSIS — E119 Type 2 diabetes mellitus without complications: Secondary | ICD-10-CM | POA: Insufficient documentation

## 2024-05-25 DIAGNOSIS — Z86718 Personal history of other venous thrombosis and embolism: Secondary | ICD-10-CM | POA: Diagnosis not present

## 2024-05-25 DIAGNOSIS — E785 Hyperlipidemia, unspecified: Secondary | ICD-10-CM | POA: Insufficient documentation

## 2024-05-25 DIAGNOSIS — I25118 Atherosclerotic heart disease of native coronary artery with other forms of angina pectoris: Secondary | ICD-10-CM | POA: Diagnosis not present

## 2024-05-25 DIAGNOSIS — I5022 Chronic systolic (congestive) heart failure: Secondary | ICD-10-CM | POA: Diagnosis present

## 2024-05-25 DIAGNOSIS — Z7984 Long term (current) use of oral hypoglycemic drugs: Secondary | ICD-10-CM | POA: Insufficient documentation

## 2024-05-25 DIAGNOSIS — I110 Hypertensive heart disease with heart failure: Secondary | ICD-10-CM | POA: Insufficient documentation

## 2024-05-25 DIAGNOSIS — I428 Other cardiomyopathies: Secondary | ICD-10-CM | POA: Insufficient documentation

## 2024-05-25 DIAGNOSIS — F1910 Other psychoactive substance abuse, uncomplicated: Secondary | ICD-10-CM

## 2024-05-25 DIAGNOSIS — Z7901 Long term (current) use of anticoagulants: Secondary | ICD-10-CM | POA: Insufficient documentation

## 2024-05-25 DIAGNOSIS — F1491 Cocaine use, unspecified, in remission: Secondary | ICD-10-CM | POA: Diagnosis not present

## 2024-05-25 DIAGNOSIS — Z79899 Other long term (current) drug therapy: Secondary | ICD-10-CM | POA: Diagnosis not present

## 2024-05-25 DIAGNOSIS — I480 Paroxysmal atrial fibrillation: Secondary | ICD-10-CM | POA: Diagnosis not present

## 2024-05-25 DIAGNOSIS — I351 Nonrheumatic aortic (valve) insufficiency: Secondary | ICD-10-CM | POA: Insufficient documentation

## 2024-05-25 DIAGNOSIS — Z955 Presence of coronary angioplasty implant and graft: Secondary | ICD-10-CM | POA: Diagnosis not present

## 2024-05-25 DIAGNOSIS — I340 Nonrheumatic mitral (valve) insufficiency: Secondary | ICD-10-CM | POA: Diagnosis not present

## 2024-05-25 DIAGNOSIS — I513 Intracardiac thrombosis, not elsewhere classified: Secondary | ICD-10-CM | POA: Diagnosis not present

## 2024-05-25 DIAGNOSIS — Z794 Long term (current) use of insulin: Secondary | ICD-10-CM | POA: Insufficient documentation

## 2024-05-25 DIAGNOSIS — G4733 Obstructive sleep apnea (adult) (pediatric): Secondary | ICD-10-CM | POA: Diagnosis not present

## 2024-05-25 DIAGNOSIS — I2510 Atherosclerotic heart disease of native coronary artery without angina pectoris: Secondary | ICD-10-CM | POA: Diagnosis not present

## 2024-05-25 DIAGNOSIS — I272 Pulmonary hypertension, unspecified: Secondary | ICD-10-CM | POA: Diagnosis not present

## 2024-05-25 DIAGNOSIS — I252 Old myocardial infarction: Secondary | ICD-10-CM | POA: Insufficient documentation

## 2024-05-25 LAB — BASIC METABOLIC PANEL WITH GFR
Anion gap: 14 (ref 5–15)
BUN: 28 mg/dL — ABNORMAL HIGH (ref 6–20)
CO2: 27 mmol/L (ref 22–32)
Calcium: 9.5 mg/dL (ref 8.9–10.3)
Chloride: 95 mmol/L — ABNORMAL LOW (ref 98–111)
Creatinine, Ser: 1.38 mg/dL — ABNORMAL HIGH (ref 0.61–1.24)
GFR, Estimated: 59 mL/min — ABNORMAL LOW
Glucose, Bld: 143 mg/dL — ABNORMAL HIGH (ref 70–99)
Potassium: 3.8 mmol/L (ref 3.5–5.1)
Sodium: 136 mmol/L (ref 135–145)

## 2024-05-25 LAB — ECHOCARDIOGRAM COMPLETE
Area-P 1/2: 5.06 cm2
Calc EF: 24.6 %
S' Lateral: 4.8 cm
Single Plane A2C EF: 20.7 %
Single Plane A4C EF: 27.3 %

## 2024-05-25 LAB — PRO BRAIN NATRIURETIC PEPTIDE: Pro Brain Natriuretic Peptide: 1316 pg/mL — ABNORMAL HIGH

## 2024-05-25 LAB — DIGOXIN LEVEL: Digoxin Level: 0.9 ng/mL (ref 0.8–2.0)

## 2024-05-25 MED ORDER — LOSARTAN POTASSIUM 25 MG PO TABS: 12.5000 mg | ORAL_TABLET | Freq: Every evening | ORAL | 3 refills | Status: AC

## 2024-05-25 NOTE — Patient Instructions (Addendum)
 Good to see you today!   RESTART losartan  12.5 mg ( 1/2 tablet) every night  Labs done today, your results will be available in MyChart, we will contact you for abnormal readings.  Follow up lab work in 2 weeks as scheduled  You have been referred to electrophysiology they will call to schedule an appointment  Your physician recommends that you schedule a follow-up appointment as scheduled  If you have any questions or concerns before your next appointment please send us  a message through [place] or call our office at [phone].    TO LEAVE A MESSAGE FOR THE NURSE SELECT OPTION 2, PLEASE LEAVE A MESSAGE INCLUDING: YOUR NAME DATE OF BIRTH CALL BACK NUMBER REASON FOR CALL**this is important as we prioritize the call backs  YOU WILL RECEIVE A CALL BACK THE SAME DAY AS LONG AS YOU CALL BEFORE 4:00 PM At the [facility], you and your health needs are our priority. As part of our continuing mission to provide you with exceptional heart care, we have created designated Provider Care Teams. These Care Teams include your primary Cardiologist (physician) and Advanced Practice Providers (APPs- Physician Assistants and Nurse Practitioners) who all work together to provide you with the care you need, when you need it.   You may see any of the following providers on your designated Care Team at your next follow up: Dr Toribio Fuel Dr Ezra Shuck Dr. Morene Brownie Greig Mosses, NP Caffie Shed, [place] [facility] [place], [place] Beckey Coe, NP Jordan Lee, NP Ellouise Class, NP Tinnie Redman, PharmD Jaun Bash, PharmD   Please be sure to bring in all your medications bottles to every appointment.    Thank you for choosing [facility]

## 2024-05-26 ENCOUNTER — Ambulatory Visit (HOSPITAL_COMMUNITY)

## 2024-05-31 ENCOUNTER — Ambulatory Visit (HOSPITAL_COMMUNITY)

## 2024-06-07 NOTE — Telephone Encounter (Signed)
 Attemped to call patient for virtual orientation. No answer, advised to call back [phone]. Reminded of in-person orientation at 1:00 pm on the 18th.  Outgoing call

## 2024-06-08 ENCOUNTER — Ambulatory Visit (HOSPITAL_COMMUNITY)

## 2024-06-15 ENCOUNTER — Ambulatory Visit: Attending: Cardiology | Admitting: Cardiovascular Disease

## 2024-07-27 ENCOUNTER — Ambulatory Visit: Admitting: Family Medicine

## 2024-08-23 ENCOUNTER — Ambulatory Visit (HOSPITAL_COMMUNITY)
